# Patient Record
Sex: Female | Born: 1960 | Race: White | Hispanic: No | Smoking: Never smoker
Health system: Southern US, Community
[De-identification: ages and names within clinical notes are randomized; demographics above are authoritative.]

## PROBLEM LIST (undated history)

## (undated) DIAGNOSIS — A419 Sepsis, unspecified organism: Secondary | ICD-10-CM

## (undated) DIAGNOSIS — G2581 Restless legs syndrome: Secondary | ICD-10-CM

## (undated) DIAGNOSIS — J189 Pneumonia, unspecified organism: Secondary | ICD-10-CM

## (undated) DIAGNOSIS — K449 Diaphragmatic hernia without obstruction or gangrene: Secondary | ICD-10-CM

## (undated) DIAGNOSIS — R000 Tachycardia, unspecified: Secondary | ICD-10-CM

## (undated) DIAGNOSIS — K219 Gastro-esophageal reflux disease without esophagitis: Secondary | ICD-10-CM

## (undated) DIAGNOSIS — G35 Multiple sclerosis: Secondary | ICD-10-CM

## (undated) DIAGNOSIS — K5909 Other constipation: Secondary | ICD-10-CM

## (undated) DIAGNOSIS — I2699 Other pulmonary embolism without acute cor pulmonale: Secondary | ICD-10-CM

## (undated) DIAGNOSIS — R0489 Hemorrhage from other sites in respiratory passages: Secondary | ICD-10-CM

## (undated) DIAGNOSIS — R296 Repeated falls: Secondary | ICD-10-CM

## (undated) DIAGNOSIS — Z800 Family history of malignant neoplasm of digestive organs: Secondary | ICD-10-CM

## (undated) HISTORY — PX: TUBAL LIGATION: SHX77

---

## 2020-02-22 DIAGNOSIS — G35 Multiple sclerosis: Secondary | ICD-10-CM | POA: Insufficient documentation

## 2020-02-23 ENCOUNTER — Other Ambulatory Visit: Payer: Self-pay | Admitting: Acute Care

## 2020-02-23 DIAGNOSIS — G35 Multiple sclerosis: Secondary | ICD-10-CM

## 2020-03-08 ENCOUNTER — Ambulatory Visit: Payer: Self-pay

## 2020-03-10 ENCOUNTER — Ambulatory Visit
Admission: RE | Admit: 2020-03-10 | Discharge: 2020-03-10 | Disposition: A | Discharge status: Home / Self Care | Payer: Medicare Other | Source: Ambulatory Visit | Attending: Acute Care | Admitting: Acute Care

## 2020-03-10 ENCOUNTER — Other Ambulatory Visit: Payer: Self-pay

## 2020-03-10 ENCOUNTER — Ambulatory Visit: Payer: Medicare Other

## 2020-03-10 DIAGNOSIS — G35 Multiple sclerosis: Secondary | ICD-10-CM | POA: Diagnosis present

## 2020-03-10 MED ORDER — GADOBUTROL 1 MMOL/ML IV SOLN
5.0000 mL | Freq: Once | INTRAVENOUS | Status: AC | PRN
  Administered 2020-03-10: 5 mL via INTRAVENOUS

## 2020-04-18 DIAGNOSIS — R296 Repeated falls: Secondary | ICD-10-CM | POA: Insufficient documentation

## 2020-04-18 DIAGNOSIS — G2581 Restless legs syndrome: Secondary | ICD-10-CM | POA: Insufficient documentation

## 2020-04-18 DIAGNOSIS — M79604 Pain in right leg: Secondary | ICD-10-CM | POA: Insufficient documentation

## 2020-04-23 ENCOUNTER — Other Ambulatory Visit: Payer: Self-pay | Admitting: Neurology

## 2020-04-23 ENCOUNTER — Other Ambulatory Visit (HOSPITAL_COMMUNITY): Payer: Self-pay | Admitting: Neurology

## 2020-04-23 DIAGNOSIS — R29898 Other symptoms and signs involving the musculoskeletal system: Secondary | ICD-10-CM

## 2020-04-23 DIAGNOSIS — M79604 Pain in right leg: Secondary | ICD-10-CM

## 2020-05-02 ENCOUNTER — Ambulatory Visit: Payer: Medicare Other

## 2020-06-21 ENCOUNTER — Other Ambulatory Visit: Payer: Self-pay

## 2020-06-21 ENCOUNTER — Ambulatory Visit
Admission: RE | Admit: 2020-06-21 | Discharge: 2020-06-21 | Disposition: A | Discharge status: Home / Self Care | Payer: Medicare Other | Source: Ambulatory Visit | Attending: Neurology | Admitting: Neurology

## 2020-06-21 DIAGNOSIS — R29898 Other symptoms and signs involving the musculoskeletal system: Secondary | ICD-10-CM | POA: Diagnosis present

## 2020-06-21 DIAGNOSIS — M79604 Pain in right leg: Secondary | ICD-10-CM | POA: Insufficient documentation

## 2020-09-26 DIAGNOSIS — R200 Anesthesia of skin: Secondary | ICD-10-CM | POA: Insufficient documentation

## 2020-12-06 DIAGNOSIS — M545 Low back pain, unspecified: Secondary | ICD-10-CM | POA: Insufficient documentation

## 2021-06-06 ENCOUNTER — Other Ambulatory Visit: Payer: Self-pay | Admitting: Neurology

## 2021-06-06 DIAGNOSIS — G35 Multiple sclerosis: Secondary | ICD-10-CM

## 2021-06-10 ENCOUNTER — Other Ambulatory Visit: Payer: Self-pay | Admitting: Nurse Practitioner

## 2021-06-10 DIAGNOSIS — R1012 Left upper quadrant pain: Secondary | ICD-10-CM | POA: Insufficient documentation

## 2021-06-10 DIAGNOSIS — Z800 Family history of malignant neoplasm of digestive organs: Secondary | ICD-10-CM | POA: Insufficient documentation

## 2021-06-10 DIAGNOSIS — K5909 Other constipation: Secondary | ICD-10-CM | POA: Insufficient documentation

## 2021-06-10 DIAGNOSIS — K219 Gastro-esophageal reflux disease without esophagitis: Secondary | ICD-10-CM | POA: Insufficient documentation

## 2021-06-26 ENCOUNTER — Ambulatory Visit
Admission: RE | Admit: 2021-06-26 | Discharge: 2021-06-26 | Disposition: A | Discharge status: Home / Self Care | Payer: Medicare Other | Source: Ambulatory Visit | Attending: Nurse Practitioner | Admitting: Nurse Practitioner

## 2021-06-26 DIAGNOSIS — G35 Multiple sclerosis: Secondary | ICD-10-CM | POA: Diagnosis present

## 2021-06-26 DIAGNOSIS — R1012 Left upper quadrant pain: Secondary | ICD-10-CM | POA: Insufficient documentation

## 2021-06-26 HISTORY — DX: Multiple sclerosis: G35

## 2021-06-26 MED ORDER — IOHEXOL 300 MG/ML  SOLN
100.0000 mL | Freq: Once | INTRAMUSCULAR | Status: AC | PRN
  Administered 2021-06-26: 100 mL via INTRAVENOUS

## 2021-07-29 ENCOUNTER — Inpatient Hospital Stay: Payer: Medicare Other

## 2021-07-29 ENCOUNTER — Inpatient Hospital Stay: Payer: Medicare Other | Admitting: Licensed Clinical Social Worker

## 2021-08-23 ENCOUNTER — Emergency Department: Payer: Medicare Other

## 2021-08-23 ENCOUNTER — Other Ambulatory Visit: Payer: Self-pay

## 2021-08-23 ENCOUNTER — Emergency Department
Admission: EM | Admit: 2021-08-23 | Discharge: 2021-08-23 | Disposition: A | Discharge status: Home / Self Care | Payer: Medicare Other | Attending: Emergency Medicine | Admitting: Emergency Medicine

## 2021-08-23 DIAGNOSIS — S93401A Sprain of unspecified ligament of right ankle, initial encounter: Secondary | ICD-10-CM | POA: Diagnosis not present

## 2021-08-23 DIAGNOSIS — M25571 Pain in right ankle and joints of right foot: Secondary | ICD-10-CM | POA: Diagnosis not present

## 2021-08-23 DIAGNOSIS — W19XXXA Unspecified fall, initial encounter: Secondary | ICD-10-CM | POA: Insufficient documentation

## 2021-08-23 DIAGNOSIS — S99911A Unspecified injury of right ankle, initial encounter: Secondary | ICD-10-CM | POA: Diagnosis present

## 2021-08-23 MED ORDER — MELOXICAM 15 MG PO TABS: 15.0000 mg | ORAL_TABLET | Freq: Every day | ORAL | 0 refills | Status: DC

## 2021-08-23 MED ORDER — MELOXICAM 7.5 MG PO TABS
15.0000 mg | ORAL_TABLET | Freq: Once | ORAL | Status: AC
  Administered 2021-08-23: 15 mg via ORAL
  Filled 2021-08-23: qty 2

## 2021-08-23 MED ORDER — OXYCODONE-ACETAMINOPHEN 5-325 MG PO TABS
1.0000 | ORAL_TABLET | Freq: Once | ORAL | Status: AC
  Administered 2021-08-23: 1 via ORAL
  Filled 2021-08-23: qty 1

## 2021-08-23 NOTE — ED Provider Notes (Signed)
? ?[facility] ?Provider Note ? ?Patient Contact: 8:46 PM (approximate) ? ? ?History  ? ?Fall ? ? ?HPI ? ?[name] is a 61 y.o. female who presents the emergency department complaining of right ankle pain after rolling her ankle.  Patient states that she has MS and sometimes her feet and ankles will give out from underneath her.  She was attempting to stand when her ankle rolled.  She is having pain along the lateral aspect of her ankle joint.  Patient with no deformity.  She typically uses a wheelchair for most of her ambulation though she does stand and walk for short periods of time typically assisted with a walker.  No other injuries or complaints.  No medication prior to arrival. ?  ? ? ?Physical Exam  ? ?Triage Vital Signs: ?ED Triage Vitals  ?Enc Vitals Group  ?   BP 08/23/21 1903 134/85  ?   Pulse Rate 08/23/21 1903 (!) 114  ?   Resp 08/23/21 1903 18  ?   Temp 08/23/21 1903 98.2 ?F (36.8 ?C)  ?   Temp Source 08/23/21 1903 Oral  ?   SpO2 08/23/21 1903 97 %  ?   Weight 08/23/21 1904 126 lb (57.2 kg)  ?   Height 08/23/21 1904 5\' 2"  (1.575 m)  ?   Head Circumference --   ?   Peak Flow --   ?   Pain Score 08/23/21 1904 1  ?   Pain Loc --   ?   Pain Edu? --   ?   Excl. in [place]? --   ? ? ?Most recent vital signs: ?Vitals:  ? 08/23/21 1903  ?BP: 134/85  ?Pulse: (!) 114  ?Resp: 18  ?Temp: 98.2 ?F (36.8 ?C)  ?SpO2: 97%  ? ? ? ?General: Alert and in no acute distress.  ?Cardiovascular:  Good peripheral perfusion ?Respiratory: Normal respiratory effort without tachypnea or retractions. Lungs CTAB.  ?Musculoskeletal: Full range of motion to all extremities.  Visualization of the ankle joint reveals mild edema around the lateral ankle extending into the foot but no obvious deformity.  Patient has good range of motion preserved.  Tender along the anterolateral ankle joint extending into the foot.  No palpable abnormalities or deficits.  Pulses sensation intact to the foot. ?Neurologic:  No  gross focal neurologic deficits are appreciated.  ?Skin:   No rash noted ?Other: ? ? ?ED Results / Procedures / Treatments  ? ?Labs ?(all labs ordered are listed, but only abnormal results are displayed) ?Labs Reviewed - No data to display ? ? ?EKG ? ? ? ? ?RADIOLOGY ? ?I personally viewed, evaluated, and interpreted these images as part of my medical decision making, as well as reviewing the written report by the radiologist. ? ?ED Provider Interpretation: No acute traumatic findings to the ankle on x-ray. ? ?DG Ankle Complete Right ? ?Result Date: 08/23/2021 ?CLINICAL DATA:  fall EXAM: RIGHT ANKLE - COMPLETE 3+ VIEW COMPARISON:  None Available. FINDINGS: No acute fracture or dislocation. Joint spaces and alignment are maintained. No area of erosion or osseous destruction. No unexpected radiopaque foreign body. Mild soft tissue edema. IMPRESSION: No acute fracture or dislocation. Electronically Signed   By: Valentino Saxon M.D.   On: 08/23/2021 19:28   ? ?PROCEDURES: ? ?Critical Care performed: No ? ?Procedures ? ? ?MEDICATIONS ORDERED IN ED: ?Medications  ?meloxicam (MOBIC) tablet 15 mg (has no administration in time range)  ?oxyCODONE-acetaminophen (PERCOCET/ROXICET) 5-325 MG per tablet 1 tablet (  has no administration in time range)  ? ? ? ?IMPRESSION / MDM / ASSESSMENT AND PLAN / ED COURSE  ?I reviewed the triage vital signs and the nursing notes. ?             ?               ? ?Differential diagnosis includes, but is not limited to, ankle sprain, fracture, ankle dislocation, fracture dislocation ? ? ?Patient's diagnosis is consistent with ankle sprain.  Patient presented to the emergency department after rolling her ankle.  No obvious osseous trauma on x-ray.  Exam was otherwise reassuring with no indication of ligamentous injury or open wounds.  Patient will be placed in an ASO lace up stirrup ankle brace.  Anti-inflammatory for symptom relief.  Follow-up with orthopedics if symptoms or not improving with  conservative therapy.  Return precautions discussed with the patient. Patient is given ED precautions to return to the ED for any worsening or new symptoms. ? ? ? ?  ? ? ?FINAL CLINICAL IMPRESSION(S) / ED DIAGNOSES  ? ?Final diagnoses:  ?Sprain of right ankle, unspecified ligament, initial encounter  ? ? ? ?Rx / DC Orders  ? ?ED Discharge Orders   ? ?      Ordered  ?  meloxicam (MOBIC) 15 MG tablet  Daily       ? 08/23/21 2131  ? ?  ?  ? ?  ? ? ? ?Note:  This document was prepared using Dragon voice recognition software and may include unintentional dictation errors. ?  ?Darletta Moll, PA-C ?08/23/21 2131 ? ?  ?Duffy Bruce, MD ?08/24/21 2039 ? ?

## 2021-08-23 NOTE — ED Notes (Signed)
Pt and husband give verbal consent to DC.  

## 2021-08-23 NOTE — ED Triage Notes (Signed)
Pt has MS, pt was trying to stand and right ankle twisted and caused her to fall. Pt states she did not hurt anything but her right ankle. Pt denies LOC or hitting head.  ?

## 2021-08-23 NOTE — ED Notes (Signed)
Pt refused d/c vitals.

## 2021-09-01 ENCOUNTER — Encounter: Payer: Self-pay | Admitting: Emergency Medicine

## 2021-09-01 ENCOUNTER — Ambulatory Visit
Admission: EM | Admit: 2021-09-01 | Discharge: 2021-09-01 | Disposition: A | Discharge status: Home / Self Care | Payer: Medicare Other | Attending: Internal Medicine | Admitting: Internal Medicine

## 2021-09-01 DIAGNOSIS — N3000 Acute cystitis without hematuria: Secondary | ICD-10-CM | POA: Diagnosis present

## 2021-09-01 LAB — POCT URINALYSIS DIP (MANUAL ENTRY)
Bilirubin, UA: NEGATIVE
Blood, UA: NEGATIVE
Glucose, UA: NEGATIVE mg/dL
Ketones, POC UA: NEGATIVE mg/dL
Nitrite, UA: NEGATIVE
Protein Ur, POC: NEGATIVE mg/dL
Spec Grav, UA: 1.015 (ref 1.010–1.025)
Urobilinogen, UA: 0.2 E.U./dL
pH, UA: 7.5 (ref 5.0–8.0)

## 2021-09-01 MED ORDER — NITROFURANTOIN MONOHYD MACRO 100 MG PO CAPS: 100.0000 mg | ORAL_CAPSULE | Freq: Two times a day (BID) | ORAL | 0 refills | Status: DC

## 2021-09-01 NOTE — ED Triage Notes (Signed)
Pt presents with urinary frequency and some dysuria x 3 weeks

## 2021-09-01 NOTE — ED Provider Notes (Signed)
[facility]    CSN: [id number] Arrival date & time: 09/01/21  1058      History   Chief Complaint Chief Complaint  Patient presents with   Urinary Frequency   Dysuria    HPI [name] is a 61 y.o. female who presents with urinary frequency and some dysuria x 3 weeks. Last Uti 1 year ago. Her home test was positive this am. But does not know if for nitrates or leuks or both.     Past Medical History:  Diagnosis Date   Multiple sclerosis [facility])     Patient Active Problem List   Diagnosis Date Noted   Multiple sclerosis ([place]) 06/26/2021    Past Surgical History:  Procedure Laterality Date   TUBAL LIGATION      OB History   No obstetric history on file.      Home Medications    Prior to Admission medications   Medication Sig Start Date End Date Taking? Authorizing Provider  nitrofurantoin, macrocrystal-monohydrate, (MACROBID) 100 MG capsule Take 1 capsule (100 mg total) by mouth 2 (two) times daily. 09/01/21  Yes Rodriguez-Southworth, Sunday Spillers, PA-C  ascorbic acid (VITAMIN C) 500 MG tablet Take by mouth.    [provider]  Cholecalciferol 50 MCG (2000 UT) CAPS Take by mouth.    [provider]  cyanocobalamin 1000 MCG tablet Take by mouth.    [provider]  dalfampridine 10 MG TB12 Take by mouth. 08/14/21   [provider]  DULoxetine (CYMBALTA) 30 MG capsule Take 30 mg by mouth daily. 06/25/21   [provider]  meloxicam (MOBIC) 15 MG tablet Take 1 tablet (15 mg total) by mouth daily. 08/23/21 08/23/22  Cuthriell, Charline Bills, PA-C  omeprazole (PRILOSEC) 20 MG capsule Take 20 mg by mouth daily. 06/10/21   [provider]  pregabalin (LYRICA) 75 MG capsule Take 75 mg by mouth 3 (three) times daily. 08/18/21   [provider]  rOPINIRole (REQUIP) 0.25 MG tablet Take 0.25 mg at night for 1 week, then increase to 0.5 mg at night 05/29/21   [provider]    Family History History  reviewed. No pertinent family history.  Social History Social History   Tobacco Use   Smoking status: Never   Smokeless tobacco: Never  Vaping Use   Vaping Use: Never used  Substance Use Topics   Alcohol use: Yes    Comment: Social     Allergies   Amoxicillin, Erythromycin, and Lexapro [escitalopram oxalate]   Review of Systems Review of Systems  Constitutional:  Negative for fever.  Genitourinary:  Positive for dysuria and frequency. Negative for flank pain.    Physical Exam Triage Vital Signs ED Triage Vitals [09/01/21 1203]  Enc Vitals Group     BP 124/86     Pulse Rate (!) 112     Resp 18     Temp      Temp src      SpO2 100 %     Weight      Height      Head Circumference      Peak Flow      Pain Score 4     Pain Loc      Pain Edu?      Excl. in [place]?    No data found.  Updated Vital Signs BP 124/86 (BP Location: Right Arm)   Pulse (!) 112   Resp 18   SpO2 100%   Visual Acuity Right  Eye Distance:   Left Eye Distance:   Bilateral Distance:    Right Eye Near:   Left Eye Near:    Bilateral Near:      Physical Exam Vitals and nursing note reviewed.  Constitutional:      General: She is not in acute distress.    Appearance: She is not toxic-appearing.  HENT:     Head: Normocephalic.     Right Ear: External ear normal.     Left Ear: External ear normal.  Eyes:     General: No scleral icterus.    Conjunctiva/sclera: Conjunctivae normal.  Pulmonary:     Effort: Pulmonary effort is normal.  Abdominal:     General: Bowel sounds are normal.     Palpations: Abdomen is soft. There is no mass.     Tenderness: There is no guarding or rebound.     Comments: - CVA tenderness   Musculoskeletal:        General: Normal range of motion.     Cervical back: Neck supple.   Skin:    General: Skin is warm and dry.     Findings: No rash.  Neurological:     Mental Status: She is alert and oriented to person, place, and time.     Gait: Gait normal.   Psychiatric:        Mood and Affect: Mood normal.        Behavior: Behavior normal.        Thought Content: Thought content normal.        Judgment: Judgment normal.    UC Treatments / Results  Labs (all labs ordered are listed, but only abnormal results are displayed) Labs Reviewed  POCT URINALYSIS DIP (MANUAL ENTRY) - Abnormal; Notable for the following components:      Result Value   Leukocytes, UA Trace (*)    All other components within normal limits  URINE CULTURE    EKG   Radiology No results found.  Procedures Procedures (including critical care time)  Medications Ordered in UC Medications - No data to display  Initial Impression / Assessment and Plan / UC Course  I have reviewed the triage vital signs and the nursing notes.  Pertinent labs  results that were available during my care of the patient were reviewed by me and considered in my medical decision making (see chart for details).  Urine culture was sent out. I placed her on Macrobid in the mean time. We will call her if we need to change the medication.    Final Clinical Impressions(s) / UC Diagnoses   Final diagnoses:  Acute cystitis without hematuria   Discharge Instructions   None    ED Prescriptions     Medication Sig Dispense Auth. Provider   nitrofurantoin, macrocrystal-monohydrate, (MACROBID) 100 MG capsule Take 1 capsule (100 mg total) by mouth 2 (two) times daily. 10 capsule Rodriguez-Southworth, Sunday Spillers, PA-C      PDMP not reviewed this encounter.   Shelby Mattocks, Hershal Coria 09/01/21 1219

## 2021-09-03 ENCOUNTER — Telehealth (HOSPITAL_COMMUNITY): Payer: Self-pay | Admitting: Emergency Medicine

## 2021-09-03 LAB — URINE CULTURE
Culture: 70000 — AB
Special Requests: NORMAL

## 2021-09-03 MED ORDER — SULFAMETHOXAZOLE-TRIMETHOPRIM 800-160 MG PO TABS: 1.0000 | ORAL_TABLET | Freq: Two times a day (BID) | ORAL | 0 refills | Status: AC

## 2021-09-10 ENCOUNTER — Encounter: Payer: Self-pay | Admitting: Gastroenterology

## 2021-09-11 ENCOUNTER — Ambulatory Visit
Admission: RE | Admit: 2021-09-11 | Discharge: 2021-09-11 | Disposition: A | Discharge status: Home / Self Care | Payer: Medicare Other | Source: Ambulatory Visit | Attending: Gastroenterology | Admitting: Gastroenterology

## 2021-09-11 ENCOUNTER — Encounter
Admission: RE | Disposition: A | Discharge status: Home / Self Care | Payer: Self-pay | Source: Ambulatory Visit | Attending: Gastroenterology

## 2021-09-11 ENCOUNTER — Ambulatory Visit: Payer: Medicare Other | Admitting: Anesthesiology

## 2021-09-11 ENCOUNTER — Encounter: Payer: Self-pay | Admitting: Gastroenterology

## 2021-09-11 DIAGNOSIS — Z800 Family history of malignant neoplasm of digestive organs: Secondary | ICD-10-CM | POA: Insufficient documentation

## 2021-09-11 DIAGNOSIS — R1084 Generalized abdominal pain: Secondary | ICD-10-CM | POA: Diagnosis present

## 2021-09-11 DIAGNOSIS — K219 Gastro-esophageal reflux disease without esophagitis: Secondary | ICD-10-CM | POA: Diagnosis not present

## 2021-09-11 DIAGNOSIS — K319 Disease of stomach and duodenum, unspecified: Secondary | ICD-10-CM | POA: Insufficient documentation

## 2021-09-11 DIAGNOSIS — K2970 Gastritis, unspecified, without bleeding: Secondary | ICD-10-CM | POA: Diagnosis not present

## 2021-09-11 DIAGNOSIS — K449 Diaphragmatic hernia without obstruction or gangrene: Secondary | ICD-10-CM | POA: Insufficient documentation

## 2021-09-11 DIAGNOSIS — G35 Multiple sclerosis: Secondary | ICD-10-CM | POA: Diagnosis not present

## 2021-09-11 HISTORY — PX: ESOPHAGOGASTRODUODENOSCOPY (EGD) WITH PROPOFOL: SHX5813

## 2021-09-11 HISTORY — DX: Gastro-esophageal reflux disease without esophagitis: K21.9

## 2021-09-11 SURGERY — ESOPHAGOGASTRODUODENOSCOPY (EGD) WITH PROPOFOL
Anesthesia: General

## 2021-09-11 MED ORDER — PROPOFOL 500 MG/50ML IV EMUL
INTRAVENOUS | Status: DC | PRN
  Administered 2021-09-11: 200 ug/kg/min via INTRAVENOUS

## 2021-09-11 MED ORDER — SODIUM CHLORIDE 0.9 % IV SOLN
INTRAVENOUS | Status: DC
  Administered 2021-09-11: 1000 mL via INTRAVENOUS

## 2021-09-11 MED ORDER — ESMOLOL HCL 100 MG/10ML IV SOLN
INTRAVENOUS | Status: DC | PRN
  Administered 2021-09-11: 10 mg via INTRAVENOUS

## 2021-09-11 MED ORDER — PROPOFOL 10 MG/ML IV BOLUS
INTRAVENOUS | Status: DC | PRN
  Administered 2021-09-11: 60 mg via INTRAVENOUS

## 2021-09-11 NOTE — H&P (Signed)
Pre-Procedure H&P   Patient ID: [name] is a 61 y.o. female.  Gastroenterology Provider: Annamaria Helling, DO  Referring Provider: Dawson Bills, NP PCP: Merryl Hacker, No  Date: 09/11/2021  HPI [name] is a 61 y.o. female who presents today for Esophagogastroduodenoscopy for chronic abdominal pain; worsening heart burn; fhx gastric cancer. Patient with chronic abdominal pain.  She notes that this is exacerbated by both ambulating and sitting.  Patient notes reflux which is improved with PPI use.  Patient has also been taking Excedrin.  She notes that the pain improves with bowel movements that time as well. CT was performed with a hiatal hernia but otherwise unremarkable. She had normal EGD multiple years ago.  Colonoscopy in 2018 was also normal with internal hemorrhoids but 10-year recommended follow-up.  Personal history endometriosis Father with history of pancreatic cancer; maternal aunt with history of pancreatic cancer and gastric cancer. Celiac negative creatinine 0.7 hemoglobin 13.5 MCV 89 platelets 185,000  Past Medical History:  Diagnosis Date   GERD (gastroesophageal reflux disease)    Multiple sclerosis ([facility])     Past Surgical History:  Procedure Laterality Date   TUBAL LIGATION      Family History Mother pancreatic ca; Maternal Aunt- pancreatic ca and gastric ca  Review of Systems  Constitutional:  Negative for activity change, appetite change, chills, diaphoresis, fatigue, fever and unexpected weight change.  HENT:  Negative for trouble swallowing and voice change.   Respiratory:  Negative for shortness of breath and wheezing.   Cardiovascular:  Negative for chest pain, palpitations and leg swelling.  Gastrointestinal:  Positive for abdominal pain and constipation. Negative for abdominal distention, anal bleeding, blood in stool, diarrhea, nausea, rectal pain and vomiting.  Musculoskeletal:  Negative for arthralgias and myalgias.  Skin:   Negative for color change and pallor.  Neurological:  Negative for dizziness, syncope and weakness.  Psychiatric/Behavioral:  Negative for confusion.   All other systems reviewed and are negative.   Medications No current facility-administered medications on file prior to encounter.   No current outpatient medications on file prior to encounter.    Pertinent medications related to GI and procedure were reviewed by me with the patient prior to the procedure   Current Facility-Administered Medications:    0.9 %  sodium chloride infusion, , Intravenous, Continuous, Annamaria Helling, DO, Last Rate: 20 mL/hr at 09/11/21 1300, 1,000 mL at 09/11/21 1300      Allergies  Allergen Reactions   Amoxicillin Hives and Nausea And Vomiting   Erythromycin Hives and Nausea And Vomiting   Lexapro [Escitalopram Oxalate] Hives   Allergies were reviewed by me prior to the procedure  Objective   Body mass index is 23.05 kg/m. Vitals:   09/11/21 1233  BP: 138/81  Pulse: 94  Resp: 20  Temp: 98.5 F (36.9 C)  SpO2: 99%  Weight: 57.2 kg  Height: 5\' 2"  (1.575 m)     Physical Exam Vitals and nursing note reviewed.  Constitutional:      General: She is not in acute distress.    Appearance: She is not ill-appearing, toxic-appearing or diaphoretic.  HENT:     Head: Normocephalic and atraumatic.     Nose: Nose normal.     Mouth/Throat:     Mouth: Mucous membranes are moist.     Pharynx: Oropharynx is clear.  Eyes:     General: No scleral icterus.    Extraocular Movements: Extraocular movements intact.  Cardiovascular:     Rate  and Rhythm: Normal rate and regular rhythm.     Heart sounds: Normal heart sounds. No murmur heard.   No friction rub. No gallop.  Pulmonary:     Effort: Pulmonary effort is normal. No respiratory distress.     Breath sounds: Normal breath sounds. No wheezing, rhonchi or rales.  Abdominal:     General: Bowel sounds are normal. There is no distension.      Palpations: Abdomen is soft.     Tenderness: There is no abdominal tenderness. There is no guarding or rebound.  Musculoskeletal:     Cervical back: Neck supple.     Right lower leg: No edema.     Left lower leg: No edema.     Comments: Legs spastic  Skin:    General: Skin is warm and dry.     Coloration: Skin is not jaundiced or pale.  Neurological:     General: No focal deficit present.     Mental Status: She is alert and oriented to person, place, and time. Mental status is at baseline.  Psychiatric:        Mood and Affect: Mood normal.        Behavior: Behavior normal.        Thought Content: Thought content normal.        Judgment: Judgment normal.     Assessment:  [name] is a 61 y.o. female  who presents today for Esophagogastroduodenoscopy for chronic abdominal pain; worsening heart burn; fhx gastric cancer.  Plan:  Esophagogastroduodenoscopy with possible intervention today  Esophagogastroduodenoscopy with possible biopsy, control of bleeding, polypectomy, and interventions as necessary has been discussed with the patient/patient representative. Informed consent was obtained from the patient/patient representative after explaining the indication, nature, and risks of the procedure including but not limited to death, bleeding, perforation, missed neoplasm/lesions, cardiorespiratory compromise, and reaction to medications. Opportunity for questions was given and appropriate answers were provided. Patient/patient representative has verbalized understanding is amenable to undergoing the procedure.   Annamaria Helling, DO  [facility]  Portions of the record may have been created with voice recognition software. Occasional wrong-word or 'sound-a-like' substitutions may have occurred due to the inherent limitations of voice recognition software.  Read the chart carefully and recognize, using context, where substitutions may have  occurred.

## 2021-09-11 NOTE — Anesthesia Preprocedure Evaluation (Signed)
Anesthesia Evaluation  Patient identified by MRN, date of birth, ID band Patient awake    Reviewed: Allergy & Precautions, NPO status , Patient's Chart, lab work & pertinent test results  History of Anesthesia Complications Negative for: history of anesthetic complications  Airway Mallampati: III  TM Distance: <3 FB Neck ROM: full    Dental  (+) Chipped   Pulmonary neg pulmonary ROS, neg shortness of breath,    Pulmonary exam normal        Cardiovascular Exercise Tolerance: Poor (-) anginaNormal cardiovascular exam     Neuro/Psych  Neuromuscular disease negative psych ROS   GI/Hepatic Neg liver ROS, GERD  Controlled,  Endo/Other  negative endocrine ROS  Renal/GU negative Renal ROS  negative genitourinary   Musculoskeletal   Abdominal   Peds  Hematology negative hematology ROS (+)   Anesthesia Other Findings Past Medical History: No date: GERD (gastroesophageal reflux disease) No date: Multiple sclerosis ([facility]CC)  Past Surgical History: No date: TUBAL LIGATION  BMI    Body Mass Index: 23.05 kg/m      Reproductive/Obstetrics negative OB ROS                             Anesthesia Physical Anesthesia Plan  ASA: 3  Anesthesia Plan: General   Post-op Pain Management:    Induction: Intravenous  PONV Risk Score and Plan: Propofol infusion and TIVA  Airway Management Planned: Natural Airway and Nasal Cannula  Additional Equipment:   Intra-op Plan:   Post-operative Plan:   Informed Consent: I have reviewed the patients History and Physical, chart, labs and discussed the procedure including the risks, benefits and alternatives for the proposed anesthesia with the patient or authorized representative who has indicated his/her understanding and acceptance.     Dental Advisory Given  Plan Discussed with: Anesthesiologist, CRNA and Surgeon  Anesthesia Plan Comments: (Patient  consented for risks of anesthesia including but not limited to:  - adverse reactions to medications - risk of airway placement if required - damage to eyes, teeth, lips or other oral mucosa - nerve damage due to positioning  - sore throat or hoarseness - Damage to heart, brain, nerves, lungs, other parts of body or loss of life  Patient voiced understanding.)        Anesthesia Quick Evaluation

## 2021-09-11 NOTE — Anesthesia Postprocedure Evaluation (Signed)
Anesthesia Post Note  Patient: Susette Racer  Procedure(s) Performed: ESOPHAGOGASTRODUODENOSCOPY (EGD) WITH PROPOFOL  Patient location during evaluation: PACU Anesthesia Type: General Level of consciousness: awake and alert, oriented and patient cooperative Pain management: pain level controlled Vital Signs Assessment: post-procedure vital signs reviewed and stable Respiratory status: spontaneous breathing, nonlabored ventilation and respiratory function stable Cardiovascular status: blood pressure returned to baseline and stable Postop Assessment: adequate PO intake Anesthetic complications: no   No notable events documented.   Last Vitals:  Vitals:   09/11/21 1417 09/11/21 1427  BP: 136/81 133/74  Pulse: (!) 108 (!) 101  Resp: 18 16  Temp:    SpO2: 98% 98%    Last Pain:  Vitals:   09/11/21 1427  TempSrc:   PainSc: 0-No pain                 Darrin Nipper

## 2021-09-11 NOTE — Op Note (Addendum)
[facility] Gastroenterology Patient Name: [name] Procedure Date: 09/11/2021 1:39 PM MRN: [id number] Account #: [id number] Date of Birth: 01-09-1961 Admit Type: Outpatient Age: 61 Room: [facility] ENDO ROOM 1 Gender: Female Note Status: Supervisor Override Instrument Name: Altamese Cabal Endoscope [id number] Procedure:             Upper GI endoscopy Indications:           Generalized abdominal pain, Gastro-esophageal reflux                         disease Providers:             Annamaria Helling DO, DO Medicines:             Monitored Anesthesia Care Complications:         No immediate complications. Estimated blood loss:                         Minimal. Procedure:             Pre-Anesthesia Assessment:                        - Prior to the procedure, a History and Physical was                         performed, and patient medications and allergies were                         reviewed. The patient is competent. The risks and                         benefits of the procedure and the sedation options and                         risks were discussed with the patient. All questions                         were answered and informed consent was obtained.                         Patient identification and proposed procedure were                         verified by the physician, the nurse, the anesthetist                         and the technician in the endoscopy suite. Mental                         Status Examination: alert and oriented. Airway                         Examination: normal oropharyngeal airway and neck                         mobility. Respiratory Examination: clear to                         auscultation. CV Examination: RRR, no murmurs, no S3  or S4. Prophylactic Antibiotics: The patient does not                         require prophylactic antibiotics. Prior                         Anticoagulants: The patient has taken no  previous                         anticoagulant or antiplatelet agents. ASA Grade                         Assessment: III - A patient with severe systemic                         disease. After reviewing the risks and benefits, the                         patient was deemed in satisfactory condition to                         undergo the procedure. The anesthesia plan was to use                         monitored anesthesia care (MAC). Immediately prior to                         administration of medications, the patient was                         re-assessed for adequacy to receive sedatives. The                         heart rate, respiratory rate, oxygen saturations,                         blood pressure, adequacy of pulmonary ventilation, and                         response to care were monitored throughout the                         procedure. The physical status of the patient was                         re-assessed after the procedure.                        After obtaining informed consent, the endoscope was                         passed under direct vision. Throughout the procedure,                         the patient's blood pressure, pulse, and oxygen                         saturations were monitored continuously. The Endoscope  was introduced through the mouth, and advanced to the                         second part of duodenum. The upper GI endoscopy was                         accomplished without difficulty. The patient tolerated                         the procedure well. Findings:      The duodenal bulb, first portion of the duodenum and second portion of       the duodenum were normal. Estimated blood loss: none. Biopsies for       histology were taken with a cold forceps for evaluation of celiac       disease. Estimated blood loss was minimal.      Localized mild inflammation characterized by erythema was found in the       gastric antrum.  Biopsies were taken with a cold forceps for Helicobacter       pylori testing. Estimated blood loss was minimal.      A 3 cm hiatal hernia was present. Estimated blood loss: none.      The Z-line was regular. Estimated blood loss: none.      The exam of the esophagus was otherwise normal. Impression:            - Normal duodenal bulb, first portion of the duodenum                         and second portion of the duodenum. Biopsied.                        - Gastritis. Biopsied.                        - 3 cm hiatal hernia.                        - Z-line regular. Recommendation:        - Discharge patient to home.                        - Resume previous diet.                        - Continue present medications.                        - Await pathology results.                        - Return to GI clinic as previously scheduled.                        - The findings and recommendations were discussed with                         the patient. Procedure Code(s):     --- Professional ---                        [phone], Esophagogastroduodenoscopy, flexible,  transoral; with biopsy, single or multiple Diagnosis Code(s):     --- Professional ---                        K29.70, Gastritis, unspecified, without bleeding                        K44.9, Diaphragmatic hernia without obstruction or                         gangrene                        R10.84, Generalized abdominal pain CPT copyright 2019 American Medical Association. All rights reserved. The codes documented in this report are preliminary and upon coder review may  be revised to meet current compliance requirements. Attending Participation:      I personally performed the entire procedure. Volney American, DO Annamaria Helling DO, DO 09/11/2021 2:11:25 PM This report has been signed electronically. Number of Addenda: 0 Note Initiated On: 09/11/2021 1:39 PM Estimated Blood Loss:  Estimated blood loss was  minimal.      [facility]

## 2021-09-11 NOTE — Anesthesia Procedure Notes (Signed)
Date/Time: 09/11/2021 1:58 PM Performed by: Junious Silk, CRNA Pre-anesthesia Checklist: Patient identified, Emergency Drugs available, Suction available, Patient being monitored and Timeout performed Oxygen Delivery Method: Nasal cannula

## 2021-09-11 NOTE — Transfer of Care (Signed)
Immediate Anesthesia Transfer of Care Note  Patient: [name]  Procedure(s) Performed: ESOPHAGOGASTRODUODENOSCOPY (EGD) WITH PROPOFOL  Patient Location: PACU  Anesthesia Type:General  Level of Consciousness: sedated  Airway & Oxygen Therapy: Patient Spontanous Breathing and Patient connected to nasal cannula oxygen  Post-op Assessment: Report given to RN and Post -op Vital signs reviewed and stable  Post vital signs: Reviewed and stable  Last Vitals:  Vitals Value Taken Time  BP    Temp    Pulse    Resp    SpO2      Last Pain:  Vitals:   09/11/21 1233  PainSc: 0-No pain      Patients Stated Pain Goal: 0 (09/11/21 1233)  Complications: No notable events documented.

## 2021-09-11 NOTE — Interval H&P Note (Signed)
History and Physical Interval Note: Preprocedure H&P from 09/11/21  was reviewed and there was no interval change after seeing and examining the patient.  Written consent was obtained from the patient after discussion of risks, benefits, and alternatives. Patient has consented to proceed with Esophagogastroduodenoscopy with possible intervention   09/11/2021 1:51 PM  [name]  has presented today for surgery, with the diagnosis of GERD.  The various methods of treatment have been discussed with the patient and family. After consideration of risks, benefits and other options for treatment, the patient has consented to  Procedure(s): ESOPHAGOGASTRODUODENOSCOPY (EGD) WITH PROPOFOL (N/A) as a surgical intervention.  The patient's history has been reviewed, patient examined, no change in status, stable for surgery.  I have reviewed the patient's chart and labs.  Questions were answered to the patient's satisfaction.     Jaynie Collins

## 2021-09-12 ENCOUNTER — Encounter: Payer: Self-pay | Admitting: Gastroenterology

## 2021-09-15 LAB — SURGICAL PATHOLOGY

## 2021-09-22 ENCOUNTER — Other Ambulatory Visit: Payer: Medicare Other

## 2021-09-23 ENCOUNTER — Ambulatory Visit
Admission: RE | Admit: 2021-09-23 | Discharge: 2021-09-23 | Disposition: A | Discharge status: Home / Self Care | Payer: Medicare Other | Source: Ambulatory Visit | Attending: Neurology | Admitting: Neurology

## 2021-09-23 DIAGNOSIS — G35 Multiple sclerosis: Secondary | ICD-10-CM | POA: Diagnosis present

## 2021-09-23 MED ORDER — GADOBUTROL 1 MMOL/ML IV SOLN
6.0000 mL | Freq: Once | INTRAVENOUS | Status: AC | PRN
Start: 2021-09-23 — End: 2021-09-23
  Administered 2021-09-23: 6 mL via INTRAVENOUS

## 2021-12-02 ENCOUNTER — Ambulatory Visit
Admission: EM | Admit: 2021-12-02 | Discharge: 2021-12-02 | Disposition: A | Discharge status: Home / Self Care | Payer: Medicare Other | Attending: Emergency Medicine | Admitting: Emergency Medicine

## 2021-12-02 DIAGNOSIS — R300 Dysuria: Secondary | ICD-10-CM | POA: Insufficient documentation

## 2021-12-02 LAB — POCT URINALYSIS DIP (MANUAL ENTRY)
Bilirubin, UA: NEGATIVE
Blood, UA: NEGATIVE
Glucose, UA: NEGATIVE mg/dL
Ketones, POC UA: NEGATIVE mg/dL
Nitrite, UA: NEGATIVE
Protein Ur, POC: NEGATIVE mg/dL
Spec Grav, UA: 1.01 (ref 1.010–1.025)
Urobilinogen, UA: 0.2 E.U./dL
pH, UA: 7 (ref 5.0–8.0)

## 2021-12-02 MED ORDER — SULFAMETHOXAZOLE-TRIMETHOPRIM 800-160 MG PO TABS
1.0000 | ORAL_TABLET | Freq: Two times a day (BID) | ORAL | 0 refills | Status: AC
Start: 2021-12-02 — End: 2021-12-07

## 2021-12-02 NOTE — Discharge Instructions (Addendum)
Take the antibiotic as directed.  The urine culture is pending.  We will call you if it shows the need to change or discontinue your antibiotic.    Follow up with your primary care provider if your symptoms are not improving.    

## 2021-12-02 NOTE — ED Provider Notes (Signed)
[facility]    CSN: [id number] Arrival date & time: 12/02/21  1115      History   Chief Complaint Chief Complaint  Patient presents with   Urinary Frequency    HPI [name] is a 61 y.o. female.  Patient presents with 2-week history of discomfort with urination and malodorous urine.  She reports history of frequent UTIs with similar symptoms.  She denies hematuria, fever, chills, abdominal pain, flank pain, nausea, vomiting, diarrhea, constipation, vaginal discharge, pelvic pain, or other symptoms.  No treatments at home.  Her medical history includes multiple sclerosis.  The history is provided by the patient and medical records.    Past Medical History:  Diagnosis Date   GERD (gastroesophageal reflux disease)    Multiple sclerosis (HCC)     Patient Active Problem List   Diagnosis Date Noted   Multiple sclerosis (HCC) 06/26/2021    Past Surgical History:  Procedure Laterality Date   ESOPHAGOGASTRODUODENOSCOPY (EGD) WITH PROPOFOL N/A 09/11/2021   Procedure: ESOPHAGOGASTRODUODENOSCOPY (EGD) WITH PROPOFOL;  Surgeon: Jaynie Collins, DO;  Location: [facility] ENDOSCOPY;  Service: Gastroenterology;  Laterality: N/A;   TUBAL LIGATION      OB History   No obstetric history on file.      Home Medications    Prior to Admission medications   Medication Sig Start Date End Date Taking? Authorizing Provider  sulfamethoxazole-trimethoprim (BACTRIM DS) 800-160 MG tablet Take 1 tablet by mouth 2 (two) times daily for 5 days. 12/02/21 12/07/21 Yes Mickie Bail, NP  ascorbic acid (VITAMIN C) 500 MG tablet Take by mouth.    [provider]  Cholecalciferol 50 MCG (2000 UT) CAPS Take by mouth.    [provider]  cyanocobalamin 1000 MCG tablet Take by mouth.    [provider]  dalfampridine 10 MG TB12 Take by mouth. 08/14/21   [provider]  DULoxetine (CYMBALTA) 30 MG capsule Take 30 mg by mouth daily. 06/25/21   [provider]  meloxicam (MOBIC) 15 MG tablet Take 1 tablet (15 mg total) by mouth daily. 08/23/21 08/23/22  Cuthriell, Delorise Royals, PA-C  omeprazole (PRILOSEC) 20 MG capsule Take 20 mg by mouth daily. 06/10/21   [provider]  pregabalin (LYRICA) 75 MG capsule Take 75 mg by mouth 3 (three) times daily. 08/18/21   [provider]  rOPINIRole (REQUIP) 0.25 MG tablet Take 0.25 mg at night for 1 week, then increase to 0.5 mg at night 05/29/21   [provider]    Family History History reviewed. No pertinent family history.  Social History Social History   Tobacco Use   Smoking status: Never   Smokeless tobacco: Never  Vaping Use   Vaping Use: Never used  Substance Use Topics   Alcohol use: Yes    Comment: Social   Drug use: Never     Allergies   Erythromycin and Lexapro [escitalopram oxalate]   Review of Systems Review of Systems  Constitutional:  Negative for chills and fever.  Gastrointestinal:  Negative for abdominal pain, diarrhea, nausea and vomiting.  Genitourinary:  Positive for dysuria. Negative for flank pain, hematuria, pelvic pain and vaginal discharge.  All other systems reviewed and are negative.    Physical Exam Triage Vital Signs ED Triage Vitals  Enc Vitals Group     BP      Pulse      Resp      Temp      Temp src  SpO2      Weight      Height      Head Circumference      Peak Flow      Pain Score      Pain Loc      Pain Edu?      Excl. in GC?    No data found.  Updated Vital Signs BP 104/67   Pulse 83   Temp 98.2 F (36.8 C)   Resp 18   Ht 5\' 2"  (1.575 m)   Wt 126 lb (57.2 kg)   SpO2 98%   BMI 23.05 kg/m   Visual Acuity Right Eye Distance:   Left Eye Distance:   Bilateral Distance:    Right Eye Near:   Left Eye Near:    Bilateral Near:     Physical Exam Vitals and nursing note reviewed.  Constitutional:      General: She is not in acute distress.    Appearance: Normal appearance. She is  well-developed. She is not ill-appearing.  HENT:     Mouth/Throat:     Mouth: Mucous membranes are moist.  Cardiovascular:     Rate and Rhythm: Normal rate and regular rhythm.     Heart sounds: Normal heart sounds.  Pulmonary:     Effort: Pulmonary effort is normal. No respiratory distress.     Breath sounds: Normal breath sounds.  Abdominal:     Palpations: Abdomen is soft.     Tenderness: There is no abdominal tenderness. There is no right CVA tenderness, left CVA tenderness, guarding or rebound.  Musculoskeletal:     Cervical back: Neck supple.  Skin:    General: Skin is warm and dry.  Neurological:     Mental Status: She is alert.  Psychiatric:        Mood and Affect: Mood normal.        Behavior: Behavior normal.      UC Treatments / Results  Labs (all labs ordered are listed, but only abnormal results are displayed) Labs Reviewed  POCT URINALYSIS DIP (MANUAL ENTRY) - Abnormal; Notable for the following components:      Result Value   Leukocytes, UA Small (1+) (*)    All other components within normal limits  URINE CULTURE    EKG   Radiology No results found.  Procedures Procedures (including critical care time)  Medications Ordered in UC Medications - No data to display  Initial Impression / Assessment and Plan / UC Course  I have reviewed the triage vital signs and the nursing notes.  Pertinent labs & imaging results that were available during my care of the patient were reviewed by me and considered in my medical decision making (see chart for details).    Dysuria. Treating with Bactrim based on last urine culture. Urine culture pending. Discussed with patient that we will call her if the urine culture shows the need to change or discontinue the antibiotic. Instructed her to follow-up with her PCP if her symptoms are not improving. Patient agrees to plan of care.     Final Clinical Impressions(s) / UC Diagnoses   Final diagnoses:  Dysuria      Discharge Instructions      Take the antibiotic as directed.  The urine culture is pending.  We will call you if it shows the need to change or discontinue your antibiotic.    Follow up with your primary care provider if your symptoms are not improving.  ED Prescriptions     Medication Sig Dispense Auth. Provider   sulfamethoxazole-trimethoprim (BACTRIM DS) 800-160 MG tablet Take 1 tablet by mouth 2 (two) times daily for 5 days. 10 tablet Mickie Bail, NP      PDMP not reviewed this encounter.   Mickie Bail, NP 12/02/21 [phone]

## 2021-12-02 NOTE — ED Triage Notes (Signed)
Patient to Urgent Care with complaints of dysuria and foul smelling urine. States symptoms started 2 weeks ago. No known fevers.

## 2021-12-03 ENCOUNTER — Telehealth (HOSPITAL_COMMUNITY): Payer: Self-pay | Admitting: Emergency Medicine

## 2021-12-03 LAB — URINE CULTURE: Culture: >=100000 — AB

## 2021-12-03 MED ORDER — NITROFURANTOIN MONOHYD MACRO 100 MG PO CAPS: 100.0000 mg | ORAL_CAPSULE | Freq: Two times a day (BID) | ORAL | 0 refills | Status: DC

## 2021-12-29 ENCOUNTER — Other Ambulatory Visit: Payer: Self-pay | Admitting: Internal Medicine

## 2021-12-29 DIAGNOSIS — Z1231 Encounter for screening mammogram for malignant neoplasm of breast: Secondary | ICD-10-CM

## 2021-12-31 ENCOUNTER — Other Ambulatory Visit: Payer: Self-pay | Admitting: Internal Medicine

## 2021-12-31 DIAGNOSIS — G8929 Other chronic pain: Secondary | ICD-10-CM

## 2022-01-19 ENCOUNTER — Ambulatory Visit
Admission: EM | Admit: 2022-01-19 | Discharge: 2022-01-19 | Disposition: A | Discharge status: Home / Self Care | Payer: Medicare Other | Attending: Urgent Care | Admitting: Urgent Care

## 2022-01-19 DIAGNOSIS — R350 Frequency of micturition: Secondary | ICD-10-CM | POA: Insufficient documentation

## 2022-01-19 DIAGNOSIS — N3000 Acute cystitis without hematuria: Secondary | ICD-10-CM | POA: Insufficient documentation

## 2022-01-19 LAB — POCT URINALYSIS DIP (MANUAL ENTRY)
Bilirubin, UA: NEGATIVE
Glucose, UA: NEGATIVE mg/dL
Ketones, POC UA: NEGATIVE mg/dL
Nitrite, UA: NEGATIVE
Protein Ur, POC: NEGATIVE mg/dL
Spec Grav, UA: 1.01 (ref 1.010–1.025)
Urobilinogen, UA: 0.2 E.U./dL
pH, UA: 6.5 (ref 5.0–8.0)

## 2022-01-19 MED ORDER — NITROFURANTOIN MONOHYD MACRO 100 MG PO CAPS: 100.0000 mg | ORAL_CAPSULE | Freq: Two times a day (BID) | ORAL | 0 refills | Status: DC

## 2022-01-19 NOTE — ED Provider Notes (Signed)
[name]    CSN: [id number] Arrival date & time: 01/19/22  1711      History   Chief Complaint No chief complaint on file.   HPI [name] is a 61 y.o. female.   HPI  Patient presents to urgent care with complaint of urinary frequency and back pain times.  Endorses mild dysuria.  Patient seen in urgent care 12/02/2021 for dysuria and treated at that time with Bactrim.  Patient switched to [facility] after culture and sensitivity.  Past Medical History:  Diagnosis Date   GERD (gastroesophageal reflux disease)    Multiple sclerosis ([place])     Patient Active Problem List   Diagnosis Date Noted   Multiple sclerosis ([place]) 06/26/2021   Abdominal pain, LUQ (left upper quadrant) 06/10/2021   Chronic constipation 06/10/2021   Family history of pancreatic cancer 06/10/2021   Gastroesophageal reflux disease 06/10/2021   Low back pain 12/06/2020   Numbness 09/26/2020   Falls frequently 04/18/2020   Right leg pain 04/18/2020   RLS (restless legs syndrome) 04/18/2020   History of multiple sclerosis ([place]) 02/22/2020    Past Surgical History:  Procedure Laterality Date   ESOPHAGOGASTRODUODENOSCOPY (EGD) WITH PROPOFOL N/A 09/11/2021   Procedure: ESOPHAGOGASTRODUODENOSCOPY (EGD) WITH PROPOFOL;  Surgeon: Annamaria Helling, DO;  Location: [facility];  Service: Gastroenterology;  Laterality: N/A;   TUBAL LIGATION      OB History   No obstetric history on file.      Home Medications    Prior to Admission medications   Medication Sig Start Date End Date Taking? Authorizing Provider  dalfampridine 10 MG TB12 Take 1 tablet by mouth every 12 (twelve) hours. 12/29/21  Yes [provider]  gabapentin (NEURONTIN) 400 MG capsule Take Gabapentin 400 mg in the morning, 400 mg in the afternoon, and 800 mg at night 12/29/21  Yes [provider]  propranolol (INDERAL) 20 MG tablet Take 1 tablet by mouth 2 (two) times daily. 12/16/21  Yes [provider]  tolterodine (DETROL LA) 2 MG 24 hr capsule Take by mouth. 12/29/21 12/29/22 Yes [provider]  ascorbic acid (VITAMIN C) 500 MG tablet Take by mouth.    [provider]  Cholecalciferol 50 MCG (2000 UT) CAPS Take by mouth.    [provider]  cyanocobalamin 1000 MCG tablet Take by mouth.    [provider]  cyclobenzaprine (FLEXERIL) 10 MG tablet Take 10 mg by mouth 2 (two) times daily as needed. 11/26/21   [provider]  dalfampridine 10 MG TB12 Take by mouth. 08/14/21   [provider]  DULoxetine (CYMBALTA) 30 MG capsule Take 30 mg by mouth daily. 06/25/21   [provider]  meloxicam (MOBIC) 15 MG tablet Take 1 tablet (15 mg total) by mouth daily. 08/23/21 08/23/22  Cuthriell, Charline Bills, PA-C  methylPREDNISolone (MEDROL DOSEPAK) 4 MG TBPK tablet Take by mouth. 11/26/21   [provider]  naproxen (NAPROSYN) 500 MG tablet Take by mouth.    [provider]  nitrofurantoin, macrocrystal-monohydrate, (MACROBID) 100 MG capsule Take 1 capsule (100 mg total) by mouth 2 (two) times daily. 12/03/21   Chase Picket, MD  omeprazole (PRILOSEC) 20 MG capsule Take 20 mg by mouth daily. 06/10/21   [provider]  pregabalin (LYRICA) 75 MG capsule Take 75 mg by mouth 3 (three) times daily. 08/18/21   [provider]  rOPINIRole (REQUIP) 0.25 MG tablet Take 0.25 mg at night for 1 week, then increase to  0.5 mg at night 05/29/21   [provider]  traMADol (ULTRAM) 50 MG tablet Take 50 mg by mouth 3 (three) times daily as needed. 12/26/21   [provider]    Family History History reviewed. No pertinent family history.  Social History Social History   Tobacco Use   Smoking status: Never   Smokeless tobacco: Never  Vaping Use   Vaping Use: Never used  Substance Use Topics   Alcohol use: Yes    Comment: Social   Drug use: Never     Allergies   Erythromycin and Lexapro  [escitalopram oxalate]   Review of Systems Review of Systems   Physical Exam Triage Vital Signs ED Triage Vitals [01/19/22 1732]  Enc Vitals Group     BP 116/78     Pulse Rate 100     Resp 14     Temp 98.2 F (36.8 C)     Temp src      SpO2 97 %     Weight      Height      Head Circumference      Peak Flow      Pain Score      Pain Loc      Pain Edu?      Excl. in GC?    No data found.  Updated Vital Signs BP 116/78   Pulse 100   Temp 98.2 F (36.8 C)   Resp 14   SpO2 97%   Visual Acuity Right Eye Distance:   Left Eye Distance:   Bilateral Distance:    Right Eye Near:   Left Eye Near:    Bilateral Near:     Physical Exam   UC Treatments / Results  Labs (all labs ordered are listed, but only abnormal results are displayed) Labs Reviewed - No data to display  EKG   Radiology No results found.  Procedures Procedures (including critical care time)  Medications Ordered in UC Medications - No data to display  Initial Impression / Assessment and Plan / UC Course  I have reviewed the triage vital signs and the nursing notes.  Pertinent labs & imaging results that were available during my care of the patient were reviewed by me and considered in my medical decision making (see chart for details).   UA positive with small leukocytes.  Will treat today with Macrobid based on last culture. 06/10/2021 GFR = 86   Final Clinical Impressions(s) / UC Diagnoses   Final diagnoses:  None   Discharge Instructions   None    ED Prescriptions   None    PDMP not reviewed this encounter.   Charma Igo, [place] 01/19/22 [name]

## 2022-01-19 NOTE — ED Triage Notes (Signed)
Pt. States she has been experiencing increase urinary frequency  and back pain. Pt. Did a home test for a UTI and it came back positive.

## 2022-01-19 NOTE — Discharge Instructions (Addendum)
Follow up here or with your primary care provider if your symptoms are worsening or not improving with treatment.     

## 2022-01-22 LAB — URINE CULTURE: Culture: >=100000 — AB

## 2022-02-24 ENCOUNTER — Emergency Department: Payer: Medicare Other

## 2022-02-24 ENCOUNTER — Encounter: Payer: Self-pay | Admitting: Emergency Medicine

## 2022-02-24 ENCOUNTER — Inpatient Hospital Stay
Admit: 2022-02-24 | Discharge: 2022-02-24 | Disposition: A | Discharge status: Home / Self Care | Payer: Medicare Other | Attending: Internal Medicine | Admitting: Internal Medicine

## 2022-02-24 ENCOUNTER — Inpatient Hospital Stay
Admission: EM | Admit: 2022-02-24 | Discharge: 2022-03-20 | DRG: 163 | Disposition: A | Discharge status: Other Acute Inpatient Hospital | Payer: Medicare Other | Attending: Internal Medicine | Admitting: Internal Medicine

## 2022-02-24 ENCOUNTER — Inpatient Hospital Stay: Payer: Medicare Other

## 2022-02-24 ENCOUNTER — Other Ambulatory Visit: Payer: Self-pay

## 2022-02-24 DIAGNOSIS — L89896 Pressure-induced deep tissue damage of other site: Secondary | ICD-10-CM | POA: Diagnosis present

## 2022-02-24 DIAGNOSIS — G2581 Restless legs syndrome: Secondary | ICD-10-CM | POA: Diagnosis present

## 2022-02-24 DIAGNOSIS — A4189 Other specified sepsis: Secondary | ICD-10-CM | POA: Diagnosis not present

## 2022-02-24 DIAGNOSIS — Z993 Dependence on wheelchair: Secondary | ICD-10-CM

## 2022-02-24 DIAGNOSIS — D62 Acute posthemorrhagic anemia: Secondary | ICD-10-CM | POA: Diagnosis not present

## 2022-02-24 DIAGNOSIS — I82453 Acute embolism and thrombosis of peroneal vein, bilateral: Secondary | ICD-10-CM | POA: Diagnosis present

## 2022-02-24 DIAGNOSIS — I110 Hypertensive heart disease with heart failure: Secondary | ICD-10-CM | POA: Diagnosis present

## 2022-02-24 DIAGNOSIS — R509 Fever, unspecified: Secondary | ICD-10-CM

## 2022-02-24 DIAGNOSIS — F32A Depression, unspecified: Secondary | ICD-10-CM | POA: Diagnosis present

## 2022-02-24 DIAGNOSIS — G35 Multiple sclerosis: Secondary | ICD-10-CM | POA: Diagnosis present

## 2022-02-24 DIAGNOSIS — J9601 Acute respiratory failure with hypoxia: Secondary | ICD-10-CM

## 2022-02-24 DIAGNOSIS — Y95 Nosocomial condition: Secondary | ICD-10-CM | POA: Diagnosis not present

## 2022-02-24 DIAGNOSIS — L89890 Pressure ulcer of other site, unstageable: Secondary | ICD-10-CM | POA: Diagnosis present

## 2022-02-24 DIAGNOSIS — R0602 Shortness of breath: Secondary | ICD-10-CM | POA: Diagnosis present

## 2022-02-24 DIAGNOSIS — Z8616 Personal history of COVID-19: Secondary | ICD-10-CM

## 2022-02-24 DIAGNOSIS — R1319 Other dysphagia: Secondary | ICD-10-CM | POA: Diagnosis not present

## 2022-02-24 DIAGNOSIS — K219 Gastro-esophageal reflux disease without esophagitis: Secondary | ICD-10-CM | POA: Diagnosis present

## 2022-02-24 DIAGNOSIS — D72821 Monocytosis (symptomatic): Secondary | ICD-10-CM | POA: Diagnosis not present

## 2022-02-24 DIAGNOSIS — Z66 Do not resuscitate: Secondary | ICD-10-CM | POA: Diagnosis not present

## 2022-02-24 DIAGNOSIS — J95851 Ventilator associated pneumonia: Secondary | ICD-10-CM | POA: Diagnosis not present

## 2022-02-24 DIAGNOSIS — R21 Rash and other nonspecific skin eruption: Secondary | ICD-10-CM | POA: Diagnosis not present

## 2022-02-24 DIAGNOSIS — Z888 Allergy status to other drugs, medicaments and biological substances status: Secondary | ICD-10-CM

## 2022-02-24 DIAGNOSIS — D72829 Elevated white blood cell count, unspecified: Secondary | ICD-10-CM

## 2022-02-24 DIAGNOSIS — R739 Hyperglycemia, unspecified: Secondary | ICD-10-CM | POA: Diagnosis present

## 2022-02-24 DIAGNOSIS — Z7901 Long term (current) use of anticoagulants: Secondary | ICD-10-CM

## 2022-02-24 DIAGNOSIS — R000 Tachycardia, unspecified: Secondary | ICD-10-CM | POA: Diagnosis not present

## 2022-02-24 DIAGNOSIS — Z9911 Dependence on respirator [ventilator] status: Secondary | ICD-10-CM | POA: Diagnosis not present

## 2022-02-24 DIAGNOSIS — Z79899 Other long term (current) drug therapy: Secondary | ICD-10-CM | POA: Diagnosis not present

## 2022-02-24 DIAGNOSIS — Z634 Disappearance and death of family member: Secondary | ICD-10-CM

## 2022-02-24 DIAGNOSIS — R0489 Hemorrhage from other sites in respiratory passages: Secondary | ICD-10-CM | POA: Diagnosis present

## 2022-02-24 DIAGNOSIS — R5381 Other malaise: Secondary | ICD-10-CM | POA: Diagnosis present

## 2022-02-24 DIAGNOSIS — R042 Hemoptysis: Secondary | ICD-10-CM | POA: Diagnosis present

## 2022-02-24 DIAGNOSIS — Z791 Long term (current) use of non-steroidal anti-inflammatories (NSAID): Secondary | ICD-10-CM

## 2022-02-24 DIAGNOSIS — J690 Pneumonitis due to inhalation of food and vomit: Secondary | ICD-10-CM | POA: Diagnosis present

## 2022-02-24 DIAGNOSIS — G9341 Metabolic encephalopathy: Secondary | ICD-10-CM | POA: Diagnosis present

## 2022-02-24 DIAGNOSIS — Y848 Other medical procedures as the cause of abnormal reaction of the patient, or of later complication, without mention of misadventure at the time of the procedure: Secondary | ICD-10-CM | POA: Diagnosis not present

## 2022-02-24 DIAGNOSIS — J189 Pneumonia, unspecified organism: Secondary | ICD-10-CM | POA: Insufficient documentation

## 2022-02-24 DIAGNOSIS — T4275XA Adverse effect of unspecified antiepileptic and sedative-hypnotic drugs, initial encounter: Secondary | ICD-10-CM | POA: Diagnosis not present

## 2022-02-24 DIAGNOSIS — J95821 Acute postprocedural respiratory failure: Secondary | ICD-10-CM | POA: Diagnosis not present

## 2022-02-24 DIAGNOSIS — K449 Diaphragmatic hernia without obstruction or gangrene: Secondary | ICD-10-CM | POA: Diagnosis present

## 2022-02-24 DIAGNOSIS — K5909 Other constipation: Secondary | ICD-10-CM | POA: Diagnosis present

## 2022-02-24 DIAGNOSIS — R7303 Prediabetes: Secondary | ICD-10-CM | POA: Diagnosis present

## 2022-02-24 DIAGNOSIS — I2699 Other pulmonary embolism without acute cor pulmonale: Secondary | ICD-10-CM | POA: Diagnosis not present

## 2022-02-24 DIAGNOSIS — Z9181 History of falling: Secondary | ICD-10-CM

## 2022-02-24 DIAGNOSIS — A419 Sepsis, unspecified organism: Secondary | ICD-10-CM | POA: Diagnosis not present

## 2022-02-24 DIAGNOSIS — G928 Other toxic encephalopathy: Secondary | ICD-10-CM | POA: Diagnosis not present

## 2022-02-24 DIAGNOSIS — I952 Hypotension due to drugs: Secondary | ICD-10-CM | POA: Diagnosis not present

## 2022-02-24 DIAGNOSIS — M6289 Other specified disorders of muscle: Secondary | ICD-10-CM | POA: Diagnosis not present

## 2022-02-24 DIAGNOSIS — R4182 Altered mental status, unspecified: Secondary | ICD-10-CM | POA: Diagnosis not present

## 2022-02-24 DIAGNOSIS — F419 Anxiety disorder, unspecified: Secondary | ICD-10-CM | POA: Diagnosis present

## 2022-02-24 DIAGNOSIS — I5033 Acute on chronic diastolic (congestive) heart failure: Secondary | ICD-10-CM | POA: Diagnosis present

## 2022-02-24 DIAGNOSIS — R1110 Vomiting, unspecified: Secondary | ICD-10-CM | POA: Diagnosis not present

## 2022-02-24 DIAGNOSIS — Z881 Allergy status to other antibiotic agents status: Secondary | ICD-10-CM

## 2022-02-24 DIAGNOSIS — D72828 Other elevated white blood cell count: Secondary | ICD-10-CM | POA: Diagnosis not present

## 2022-02-24 DIAGNOSIS — K2289 Other specified disease of esophagus: Secondary | ICD-10-CM | POA: Diagnosis present

## 2022-02-24 DIAGNOSIS — R197 Diarrhea, unspecified: Secondary | ICD-10-CM | POA: Diagnosis present

## 2022-02-24 DIAGNOSIS — R1310 Dysphagia, unspecified: Secondary | ICD-10-CM | POA: Diagnosis present

## 2022-02-24 DIAGNOSIS — Z9889 Other specified postprocedural states: Secondary | ICD-10-CM | POA: Diagnosis not present

## 2022-02-24 LAB — BASIC METABOLIC PANEL
Anion gap: 9 (ref 5–15)
BUN: 14 mg/dL (ref 6–20)
CO2: 23 mmol/L (ref 22–32)
Calcium: 9.2 mg/dL (ref 8.9–10.3)
Chloride: 106 mmol/L (ref 98–111)
Creatinine, Ser: 0.62 mg/dL (ref 0.44–1.00)
GFR, Estimated: >60 mL/min (ref >60)
Glucose, Bld: 106 mg/dL — ABNORMAL HIGH (ref 70–99)
Potassium: 3.7 mmol/L (ref 3.5–5.1)
Sodium: 138 mmol/L (ref 135–145)

## 2022-02-24 LAB — TROPONIN I (HIGH SENSITIVITY): Troponin I (High Sensitivity): 9 ng/L (ref <18)

## 2022-02-24 LAB — CBC
HCT: 29.6 % — ABNORMAL LOW (ref 36.0–46.0)
Hemoglobin: 9.3 g/dL — ABNORMAL LOW (ref 12.0–15.0)
MCH: 28.8 pg (ref 26.0–34.0)
MCHC: 31.4 g/dL (ref 30.0–36.0)
MCV: 91.6 fL (ref 80.0–100.0)
Platelets: 231 10*3/uL (ref 150–400)
RBC: 3.23 MIL/uL — ABNORMAL LOW (ref 3.87–5.11)
RDW: 20.6 % — ABNORMAL HIGH (ref 11.5–15.5)
WBC: 15.7 10*3/uL — ABNORMAL HIGH (ref 4.0–10.5)
nRBC: 0 % (ref 0.0–0.2)

## 2022-02-24 LAB — LACTIC ACID, PLASMA
Lactic Acid, Venous: 1.9 mmol/L (ref 0.5–1.9)
Lactic Acid, Venous: 1 mmol/L (ref 0.5–1.9)

## 2022-02-24 LAB — PROTIME-INR
INR: 1.3 — ABNORMAL HIGH (ref 0.8–1.2)
Prothrombin Time: 15.6 seconds — ABNORMAL HIGH (ref 11.4–15.2)

## 2022-02-24 LAB — PROCALCITONIN: Procalcitonin: 0.23 ng/mL

## 2022-02-24 LAB — APTT: aPTT: 47 seconds — ABNORMAL HIGH (ref 24–36)

## 2022-02-24 LAB — ECHOCARDIOGRAM COMPLETE
S' Lateral: 2.5 cm
Weight: 2017.65 oz

## 2022-02-24 LAB — BRAIN NATRIURETIC PEPTIDE: B Natriuretic Peptide: 46.2 pg/mL (ref 0.0–100.0)

## 2022-02-24 MED ORDER — ACETAMINOPHEN 650 MG RE SUPP
650.0000 mg | Freq: Four times a day (QID) | RECTAL | Status: DC | PRN
  Administered 2022-02-25 (×2): 650 mg via RECTAL
  Filled 2022-02-24: qty 2
  Filled 2022-02-24: qty 1

## 2022-02-24 MED ORDER — HEPARIN BOLUS VIA INFUSION
4000.0000 [IU] | Freq: Once | INTRAVENOUS | Status: AC
  Administered 2022-02-24: 4000 [IU] via INTRAVENOUS
  Filled 2022-02-24: qty 4000

## 2022-02-24 MED ORDER — PANTOPRAZOLE SODIUM 40 MG PO TBEC
40.0000 mg | DELAYED_RELEASE_TABLET | Freq: Every day | ORAL | Status: DC
  Administered 2022-02-25 (×2): 40 mg via ORAL
  Filled 2022-02-24 (×2): qty 1

## 2022-02-24 MED ORDER — ONDANSETRON HCL 4 MG PO TABS: 4.0000 mg | ORAL_TABLET | Freq: Four times a day (QID) | ORAL | Status: AC | PRN

## 2022-02-24 MED ORDER — ONDANSETRON HCL 4 MG/2ML IJ SOLN
4.0000 mg | Freq: Four times a day (QID) | INTRAMUSCULAR | Status: AC | PRN
  Administered 2022-02-24 – 2022-02-25 (×2): 4 mg via INTRAVENOUS
  Filled 2022-02-24 (×2): qty 2

## 2022-02-24 MED ORDER — ACETAMINOPHEN 325 MG PO TABS
650.0000 mg | ORAL_TABLET | Freq: Four times a day (QID) | ORAL | Status: DC | PRN
  Administered 2022-02-25: 650 mg via ORAL
  Filled 2022-02-24: qty 2

## 2022-02-24 MED ORDER — GABAPENTIN 400 MG PO CAPS
800.0000 mg | ORAL_CAPSULE | Freq: Every day | ORAL | Status: DC
  Administered 2022-02-25: 800 mg via ORAL
  Filled 2022-02-24: qty 2

## 2022-02-24 MED ORDER — GABAPENTIN 400 MG PO CAPS
400.0000 mg | ORAL_CAPSULE | Freq: Two times a day (BID) | ORAL | Status: DC
  Administered 2022-02-25: 400 mg via ORAL
  Filled 2022-02-24: qty 1

## 2022-02-24 MED ORDER — DULOXETINE HCL 30 MG PO CPEP
30.0000 mg | ORAL_CAPSULE | Freq: Every day | ORAL | Status: DC
  Administered 2022-02-25 (×2): 30 mg via ORAL
  Filled 2022-02-24 (×2): qty 1

## 2022-02-24 MED ORDER — DOXYCYCLINE HYCLATE 100 MG PO TABS
100.0000 mg | ORAL_TABLET | Freq: Once | ORAL | Status: AC
  Administered 2022-02-24: 100 mg via ORAL
  Filled 2022-02-24: qty 1

## 2022-02-24 MED ORDER — TRAMADOL HCL 50 MG PO TABS
50.0000 mg | ORAL_TABLET | Freq: Three times a day (TID) | ORAL | Status: DC | PRN
  Administered 2022-02-25: 50 mg via ORAL
  Filled 2022-02-24: qty 1

## 2022-02-24 MED ORDER — PREGABALIN 75 MG PO CAPS
75.0000 mg | ORAL_CAPSULE | Freq: Three times a day (TID) | ORAL | Status: DC
  Administered 2022-02-25 (×2): 75 mg via ORAL
  Filled 2022-02-24 (×3): qty 1

## 2022-02-24 MED ORDER — VANCOMYCIN HCL IN DEXTROSE 1-5 GM/200ML-% IV SOLN
1000.0000 mg | Freq: Once | INTRAVENOUS | Status: AC
  Administered 2022-02-24: 1000 mg via INTRAVENOUS
  Filled 2022-02-24 (×2): qty 200

## 2022-02-24 MED ORDER — ROPINIROLE HCL 0.25 MG PO TABS
0.5000 mg | ORAL_TABLET | Freq: Every day | ORAL | Status: DC
  Administered 2022-02-25: 0.5 mg via ORAL
  Filled 2022-02-24 (×2): qty 2

## 2022-02-24 MED ORDER — GABAPENTIN 300 MG PO CAPS: 400.0000 mg | ORAL_CAPSULE | ORAL | Status: DC

## 2022-02-24 MED ORDER — VITAMIN B-12 1000 MCG PO TABS
1000.0000 ug | ORAL_TABLET | Freq: Every day | ORAL | Status: DC
  Administered 2022-02-25 – 2022-02-27 (×2): 1000 ug via ORAL
  Filled 2022-02-24 (×3): qty 1

## 2022-02-24 MED ORDER — PROPRANOLOL HCL 20 MG PO TABS
20.0000 mg | ORAL_TABLET | Freq: Two times a day (BID) | ORAL | Status: DC
  Administered 2022-02-25 – 2022-02-27 (×3): 20 mg via ORAL
  Filled 2022-02-24 (×4): qty 1

## 2022-02-24 MED ORDER — SENNOSIDES-DOCUSATE SODIUM 8.6-50 MG PO TABS: 1.0000 | ORAL_TABLET | Freq: Every evening | ORAL | Status: DC | PRN

## 2022-02-24 MED ORDER — DALFAMPRIDINE ER 10 MG PO TB12: 10.0000 mg | ORAL_TABLET | Freq: Every day | ORAL | Status: DC

## 2022-02-24 MED ORDER — HEPARIN (PORCINE) 25000 UT/250ML-% IV SOLN
950.0000 [IU]/h | INTRAVENOUS | Status: DC
  Administered 2022-02-24: 950 [IU]/h via INTRAVENOUS
  Filled 2022-02-24: qty 250

## 2022-02-24 MED ORDER — IOHEXOL 350 MG/ML SOLN
75.0000 mL | Freq: Once | INTRAVENOUS | Status: AC | PRN
  Administered 2022-02-24: 75 mL via INTRAVENOUS

## 2022-02-24 MED ORDER — SODIUM CHLORIDE 0.9 % IV SOLN
2.0000 g | Freq: Once | INTRAVENOUS | Status: AC
  Administered 2022-02-24: 2 g via INTRAVENOUS
  Filled 2022-02-24: qty 20

## 2022-02-24 MED ORDER — LACTATED RINGERS IV SOLN: INTRAVENOUS | Status: DC

## 2022-02-24 NOTE — Progress Notes (Signed)
Unable to get blood cultures at this time, antibiotics hung

## 2022-02-24 NOTE — Consult Note (Signed)
PHARMACY -  BRIEF ANTIBIOTIC NOTE   Pharmacy has received consult(s) for vancomycin from an ED provider.  The patient's profile has been reviewed for ht/wt/allergies/indication/available labs.    One time order(s) placed for vancomycin 1 gram IV x 1  Further antibiotics/pharmacy consults should be ordered by admitting physician if indicated.                       Thank you, Barrie Folk 02/24/2022  5:58 PM

## 2022-02-24 NOTE — Hospital Course (Signed)
[name] is a 60 year old female with history of hypertension, depression, anxiety, restless leg syndrome, GERD, who presents emergency department for chief concerns of shortness of breath via POV.  Initial vitals in the emergency department showed temperature of 99.1, respiration rate of 20, heart rate of 140, blood pressure 124/59, SPO2 of 94% on room air.  Serum sodium is 138, potassium 2.7, chloride 106, bicarb 23, BUN of 14, serum creatinine 0.62, EGFR greater than 60, nonfasting blood glucose 106, WBC 15.7, hemoglobin 9.3, platelets of 231.  CTA PE: Was read as acute PE with moderate thrombus burden involving both lungs.  RV-LV ratio is 1.3 suggesting acute or chronic right heart dysfunction.  Small right pleural effusion.  Patchy infiltrates in the perihilar regions of both lung fields, right more than left.  Moderate to large size fixed hiatal hernia.  ED treatment: Ceftriaxone 2 g IV, vancomycin, LR 150 mL bolus, doxycycline.

## 2022-02-24 NOTE — Assessment & Plan Note (Signed)
-   Patient states that she has been prescribed propanolol 20 mg twice daily for this, she states that she missed her a.m. dose due to feeling unwell

## 2022-02-24 NOTE — Consult Note (Signed)
ANTICOAGULATION CONSULT NOTE - Initial Consult  Pharmacy Consult for heparin drip Indication: pulmonary embolus  Allergies  Allergen Reactions   Erythromycin Hives and Nausea And Vomiting   Lexapro [Escitalopram Oxalate] Hives    Patient Measurements: Weight: 57.2 kg (126 lb 1.7 oz) (from office visit on 02/06/22) Heparin Dosing Weight: 57.2 kg  Vital Signs: Temp: 99.1 F (37.3 C) (11/14 1535) Temp Source: Oral (11/14 1535) BP: 150/74 (11/14 1710) Pulse Rate: 138 (11/14 1710)  Labs: Recent Labs    02/24/22 1541 02/24/22 1542  HGB 9.3*  --   HCT 29.6*  --   PLT 231  --   CREATININE 0.62  --   TROPONINIHS  --  9    Estimated Creatinine Clearance: 60.6 mL/min (by C-G formula based on SCr of 0.62 mg/dL).  Baseline PT/INR and aPTT are pending.   Medical History: Past Medical History:  Diagnosis Date   GERD (gastroesophageal reflux disease)    Multiple sclerosis (HCC)     Medications:  No home anticoagulation per pharmacist review.  Assessment: 61 yo female presented to ED with SOB when lying flat and some right sided rib and shoulder pain.  Chest CT showed acute PE.  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  Goal of Therapy:  Heparin level 0.3-0.7 units/ml Monitor platelets by anticoagulation protocol: Yes   Plan:  Give 4000 units bolus x 1 Start heparin infusion at 950 units/hr Check anti-Xa level in 6 hours and daily while on heparin Continue to monitor H&H and platelets  Barrie Folk 02/24/2022,5:48 PM

## 2022-02-24 NOTE — ED Triage Notes (Signed)
Patient to ED via POV for SOB. Patient state the SOB is only when she lays down. Patient also c/o right sided rib and shoulder pain. Patient does not have energy like normal. Pt states she has not taken her propranolol for HR today. Patient tested positive for covid 4 weeks ago. Hx of MS- wheelchair bound.

## 2022-02-24 NOTE — Assessment & Plan Note (Signed)
-   Senna docusate nightly as needed ordered

## 2022-02-24 NOTE — Progress Notes (Signed)
Notified bedside nurse of need to draw lactic acid and blood cultures.  

## 2022-02-24 NOTE — Progress Notes (Signed)
CODE SEPSIS - PHARMACY COMMUNICATION  **Broad Spectrum Antibiotics should be administered within 1 hour of Sepsis diagnosis**  Time Code Sepsis Called/Page Received: 1720  Antibiotics Ordered: ceftriaxone and vancomycin  Time of 1st antibiotic administration: 1827  Additional action taken by pharmacy: Messaged nurse at 1811  If necessary, Name of Provider/Nurse Contacted: Lilli Light, RN   Barrie Folk ,PharmD Clinical Pharmacist  02/24/2022  5:50 PM

## 2022-02-24 NOTE — Assessment & Plan Note (Signed)
-   Ropinirole 0.5 milligrams p.o. daily

## 2022-02-24 NOTE — Assessment & Plan Note (Signed)
-   PPI resumed °

## 2022-02-24 NOTE — ED Notes (Signed)
Patient transported to CT 

## 2022-02-24 NOTE — Progress Notes (Signed)
Elink following code sepsis °

## 2022-02-24 NOTE — H&P (Signed)
History and Physical   [name] RFF:[id number] DOB: 10/05/1960 DOA: 02/24/2022  PCP: [facility]  Outpatient Specialists:  Patient coming from: Home via [place]  I have personally briefly reviewed patient's old medical records in [place].  Chief Concern: Shortness of breath  HPI: [name] is a 61 year old female with history of hypertension, depression, anxiety, restless leg syndrome, GERD, who presents emergency department for chief concerns of shortness of breath via POV.  Initial vitals in the emergency department showed temperature of 99.1, respiration rate of 20, heart rate of 140, blood pressure 124/59, SPO2 of 94% on room air.  Serum sodium is 138, potassium 2.7, chloride 106, bicarb 23, BUN of 14, serum creatinine 0.62, EGFR greater than 60, nonfasting blood glucose 106, WBC 15.7, hemoglobin 9.3, platelets of 231.  CTA PE: Was read as acute PE with moderate thrombus burden involving both lungs.  RV-LV ratio is 1.3 suggesting acute or chronic right heart dysfunction.  Small right pleural effusion.  Patchy infiltrates in the perihilar regions of both lung fields, right more than left.  Moderate to large size fixed hiatal hernia.  ED treatment: Ceftriaxone 2 g IV, vancomycin, LR 150 mL bolus, doxycycline. --------------------------- At bedside patient was able to tell me her name, her age, the current calendar year and she knows that she is in the hospital.  She reports that she developed shortness of breath 3 to 4 days ago.  She denies known fever at home and trauma to her person.  She denies chest pain, abdominal pain, dysuria, hematuria.  She did have 1 episode of diarrhea today.  She denies blood in her stool.  She endorses having COVID about 4 weeks ago.  She denies recent international travel or long car trips.  She denies history of cancer that she knows of.  She denies recent surgery.  She denies changes to her leg pain.  She states  at baseline she has leg pain because of MS and restless leg syndrome.  Patient reports that her last meal was approximately 8 or 9 AM and numerous piece of toast.  ROS: Constitutional: no weight change, no fever ENT/Mouth: no sore throat, no rhinorrhea Eyes: no eye pain, no vision changes Cardiovascular: no chest pain, + dyspnea,  no edema, no palpitations Respiratory: no cough, no sputum, no wheezing Gastrointestinal: no nausea, no vomiting, no diarrhea, no constipation Genitourinary: no urinary incontinence, no dysuria, no hematuria Musculoskeletal: no arthralgias, no myalgias Skin: no skin lesions, no pruritus, Neuro: + weakness, no loss of consciousness, no syncope Psych: no anxiety, no depression, no decrease appetite Heme/Lymph: no bruising, no bleeding  ED Course: Discussed with emergency medicine provider, patient requiring hospitalization for chief concerns of bilateral PE.  Assessment/Plan  Principal Problem:   Bilateral pulmonary embolism (HCC) Active Problems:   Multiple sclerosis (HCC)   Chronic constipation   Gastroesophageal reflux disease   RLS (restless legs syndrome)   Tachycardia   Assessment and Plan:  * Bilateral pulmonary embolism (HCC) - Continue heparin GTT per pharmacy - Vascular has been consulted via secure chat and epic order - Complete echo ordered - Bilateral lower extremity ultrasound have been ordered to assess for DVT - Admit to progressive cardiac, inpatient  Tachycardia - Patient states that she has been prescribed propanolol 20 mg twice daily for this, she states that she missed her a.m. dose due to feeling unwell  RLS (restless legs syndrome) - Ropinirole 0.5 milligrams p.o. daily  Gastroesophageal reflux disease - PPI resumed  Chronic constipation - Senna docusate nightly as needed ordered  Multiple sclerosis ([place]) - Patient takes dalfampridine 10 mg every 12 hours, gabapentin 400 mg in the morning, 400 mg in the afternoon,  800 mg at night, these have been resumed - Patient takes tolterodine 2 mg daily and this is not available, patient states she will bring this medication to the hospital, pharmacy consultation for reliably ordered   Chart reviewed.   DVT prophylaxis: Heparin GTT Code Status: Full code Diet: N.p.o. except for sips of meds Family Communication: Updated boyfriend/significant other at bedside with patient's permission Disposition Plan: Pending clinical course Consults called: Vascular Admission status: Stepdown, inpatient  Past Medical History:  Diagnosis Date   GERD (gastroesophageal reflux disease)    Multiple sclerosis ([place])    Past Surgical History:  Procedure Laterality Date   ESOPHAGOGASTRODUODENOSCOPY (EGD) WITH PROPOFOL N/A 09/11/2021   Procedure: ESOPHAGOGASTRODUODENOSCOPY (EGD) WITH PROPOFOL;  Surgeon: Annamaria Helling, DO;  Location: [facility] ENDOSCOPY;  Service: Gastroenterology;  Laterality: N/A;   TUBAL LIGATION     Social History:  reports that she has never smoked. She has never used smokeless tobacco. She reports current alcohol use. She reports that she does not use drugs.  Allergies  Allergen Reactions   Erythromycin Hives and Nausea And Vomiting   Lexapro [Escitalopram Oxalate] Hives   History reviewed. No pertinent family history. Family history: Family history reviewed and not pertinent  Prior to Admission medications   Medication Sig Start Date End Date Taking? Authorizing Provider  ascorbic acid (VITAMIN C) 500 MG tablet Take by mouth.    [provider]  Cholecalciferol 50 MCG (2000 UT) CAPS Take by mouth.    [provider]  cyanocobalamin 1000 MCG tablet Take by mouth.    [provider]  cyclobenzaprine (FLEXERIL) 10 MG tablet Take 10 mg by mouth 2 (two) times daily as needed. 11/26/21   [provider]  dalfampridine 10 MG TB12 Take by mouth. 08/14/21   [provider]  dalfampridine 10 MG TB12 Take 1 tablet by  mouth every 12 (twelve) hours. 12/29/21   [provider]  DULoxetine (CYMBALTA) 30 MG capsule Take 30 mg by mouth daily. 06/25/21   [provider]  gabapentin (NEURONTIN) 400 MG capsule Take Gabapentin 400 mg in the morning, 400 mg in the afternoon, and 800 mg at night 12/29/21   [provider]  meloxicam (MOBIC) 15 MG tablet Take 1 tablet (15 mg total) by mouth daily. 08/23/21 08/23/22  Cuthriell, Charline Bills, PA-C  methylPREDNISolone (MEDROL DOSEPAK) 4 MG TBPK tablet Take by mouth. 11/26/21   [provider]  naproxen (NAPROSYN) 500 MG tablet Take by mouth.    [provider]  nitrofurantoin, macrocrystal-monohydrate, (MACROBID) 100 MG capsule Take 1 capsule (100 mg total) by mouth 2 (two) times daily. 01/19/22   Immordino, Annie Main, FNP  omeprazole (PRILOSEC) 20 MG capsule Take 20 mg by mouth daily. 06/10/21   [provider]  pregabalin (LYRICA) 75 MG capsule Take 75 mg by mouth 3 (three) times daily. 08/18/21   [provider]  propranolol (INDERAL) 20 MG tablet Take 1 tablet by mouth 2 (two) times daily. 12/16/21   [provider]  rOPINIRole (REQUIP) 0.25 MG tablet Take 0.25 mg at night for 1 week, then increase to 0.5 mg at night 05/29/21   [provider]  tolterodine (DETROL LA) 2 MG 24 hr capsule Take by mouth. 12/29/21 12/29/22  [provider]  traMADol (ULTRAM) 50 MG  tablet Take 50 mg by mouth 3 (three) times daily as needed. 12/26/21   [provider]   Physical Exam: Vitals:   02/24/22 1710 02/24/22 1829 02/24/22 1935 02/24/22 2100  BP: (!) 150/74  (!) 155/67 (!) 140/67  Pulse: (!) 138  (!) 146 (!) 159  Resp: (!) 29  (!) 26 (!) 30  Temp:  98.3 F (36.8 C)    TempSrc:  Oral    SpO2: 93%  100% 93%  Weight: 57.2 kg      Constitutional: appears , NAD, calm, comfortable Eyes: PERRL, lids and conjunctivae normal ENMT: Mucous membranes are moist. Posterior pharynx clear of any exudate or lesions.  Age-appropriate dentition. Hearing appropriate Neck: normal, supple, no masses, no thyromegaly Respiratory: clear to auscultation bilaterally, no wheezing, no crackles. Normal respiratory effort. No accessory muscle use.  Cardiovascular: Regular rate and rhythm, no murmurs / rubs / gallops. No extremity edema. 2+ pedal pulses. No carotid bruits.  Abdomen: no tenderness, no masses palpated, no hepatosplenomegaly. Bowel sounds positive.  Musculoskeletal: no clubbing / cyanosis. No joint deformity upper and lower extremities. Good ROM, no contractures, no atrophy. Normal muscle tone.  Skin: no rashes, lesions, ulcers. No induration Neurologic: Sensation intact. Strength 5/5 in all 4.  Psychiatric: Normal judgment and insight. Alert and oriented x 3. Depressed mood. Tearful  EKG: independently reviewed, showing sinus tachycardia with rate of 139, QTc 419  Chest x-ray on Admission: I personally reviewed and I agree with radiologist reading as below.  US Venous Img Lower Bilateral (DVT)  Result Date: 02/24/2022 CLINICAL DATA:  History of pulmonary embolus. EXAM: BILATERAL LOWER EXTREMITY VENOUS DOPPLER ULTRASOUND TECHNIQUE: Gray-scale sonography with graded compression, as well as color Doppler and duplex ultrasound were performed to evaluate the lower extremity deep venous systems from the level of the common femoral vein and including the common femoral, femoral, profunda femoral, popliteal and calf veins including the posterior tibial, peroneal and gastrocnemius veins when visible. The superficial great saphenous vein was also interrogated. Spectral Doppler was utilized to evaluate flow at rest and with distal augmentation maneuvers in the common femoral, femoral and popliteal veins. COMPARISON:  CT scan of same day. FINDINGS: RIGHT LOWER EXTREMITY Common Femoral Vein: No evidence of thrombus. Normal compressibility, respiratory phasicity and response to augmentation. Saphenofemoral Junction: No  evidence of thrombus. Normal compressibility and flow on color Doppler imaging. Profunda Femoral Vein: No evidence of thrombus. Normal compressibility and flow on color Doppler imaging. Femoral Vein: No evidence of thrombus. Normal compressibility, respiratory phasicity and response to augmentation. Popliteal Vein: No evidence of thrombus. Normal compressibility, respiratory phasicity and response to augmentation. Calf Veins: There appears to be occlusive thrombus in the peroneal vein. Venous Reflux:  None. Other Findings:  None. LEFT LOWER EXTREMITY Common Femoral Vein: No evidence of thrombus. Normal compressibility, respiratory phasicity and response to augmentation. Saphenofemoral Junction: No evidence of thrombus. Normal compressibility and flow on color Doppler imaging. Profunda Femoral Vein: No evidence of thrombus. Normal compressibility and flow on color Doppler imaging. Femoral Vein: No evidence of thrombus. Normal compressibility, respiratory phasicity and response to augmentation. Popliteal Vein: No evidence of thrombus. Normal compressibility, respiratory phasicity and response to augmentation. Calf Veins: There appears to be occlusive thrombus in the peroneal vein. Superficial Great Saphenous Vein: No evidence of thrombus. Normal compressibility. Venous Reflux:  None. Other Findings:  None. IMPRESSION: Occlusive thrombus is noted in the peroneal veins bilaterally. Electronically Signed   By: Marijo Conception M.D.   On: 02/24/2022 18:54  CT Angio Chest PE W and/or Wo Contrast  Result Date: 02/24/2022 CLINICAL DATA:  Shortness of breath, right chest pain EXAM: CT ANGIOGRAPHY CHEST WITH CONTRAST TECHNIQUE: Multidetector CT imaging of the chest was performed using the standard protocol during bolus administration of intravenous contrast. Multiplanar CT image reconstructions and MIPs were obtained to evaluate the vascular anatomy. RADIATION DOSE REDUCTION: This exam was performed according to the  departmental dose-optimization program which includes automated exposure control, adjustment of the mA and/or kV according to patient size and/or use of iterative reconstruction technique. CONTRAST:  58m OMNIPAQUE IOHEXOL 350 MG/ML SOLN COMPARISON:  Chest radiograph done earlier today FINDINGS: Cardiovascular: Heart is enlarged in size. RV LV ratio is 1.3. This may be recent or chronic. There is homogeneous enhancement in thoracic aorta. There are filling defects in segmental and subsegmental branches in right upper lobe, right middle lobe, right lower lobe, left upper lobe and left lower lobe. There is moderate thrombus burden. Mediastinum/Nodes: No significant lymphadenopathy seen. Lungs/Pleura: There are patchy infiltrates in parahilar regions and lower lung fields, more so in right lower lobe. There is small right pleural effusion. There is no pneumothorax. Upper Abdomen: There is moderate to large sized fixed hiatal hernia. Musculoskeletal: No acute findings are seen. Review of the MIP images confirms the above findings. IMPRESSION: Acute PE with moderate thrombus burden involving both lungs. RV-LV ratio is 1.3 suggesting acute or chronic right heart dysfunction. Small right pleural effusion. There are patchy infiltrates in parahilar regions and both lower lung fields, more so in right lower lobe suggesting atelectasis/pneumonia. Part of this finding could be due to developing pulmonary infarcts. Moderate to large sized fixed hiatal hernia. Imaging findings were relayed to patient's provider Dr. MMarjean Donnaby telephone call. Electronically Signed   By: PElmer PickerM.D.   On: 02/24/2022 17:13   DG Chest 2 View  Result Date: 02/24/2022 CLINICAL DATA:  Short of breath EXAM: CHEST - 2 VIEW COMPARISON:  None Available. FINDINGS: Heart size and vascularity normal. Negative for heart failure. Mild bibasilar airspace disease likely atelectasis. Small right pleural effusion. IMPRESSION: Mild bibasilar  atelectasis.  Small right pleural effusion Electronically Signed   By: CFranchot GalloM.D.   On: 02/24/2022 16:05    Labs on Admission: I have personally reviewed following labs  CBC: Recent Labs  Lab 02/24/22 1541  WBC 15.7*  HGB 9.3*  HCT 29.6*  MCV 91.6  PLT 2371  Basic Metabolic Panel: Recent Labs  Lab 02/24/22 1541  NA 138  K 3.7  CL 106  CO2 23  GLUCOSE 106*  BUN 14  CREATININE 0.62  CALCIUM 9.2   GFR: Estimated Creatinine Clearance: 60.6 mL/min (by C-G formula based on SCr of 0.62 mg/dL).  Urine analysis:    Component Value Date/Time   BILIRUBINUR negative 01/19/2022 1754   KETONESUR negative 01/19/2022 1754   PROTEINUR negative 01/19/2022 1754   UROBILINOGEN 0.2 01/19/2022 1754   NITRITE Negative 01/19/2022 1754   LEUKOCYTESUR Small (1+) (A) 01/19/2022 1754   Dr. CTobie PoetTriad Hospitalists  If 7PM-7AM, please contact overnight-coverage provider If 7AM-7PM, please contact day coverage provider [email or web address]  02/24/2022, 9:32 PM

## 2022-02-24 NOTE — ED Notes (Signed)
Two RNs attempted to obtain blood cultures and lactic w/o success.  Lab notified.

## 2022-02-24 NOTE — Assessment & Plan Note (Signed)
-   Continue heparin GTT per pharmacy - Vascular has been consulted via secure chat and epic order - Complete echo ordered - Bilateral lower extremity ultrasound have been ordered to assess for DVT - Admit to progressive cardiac, inpatient

## 2022-02-24 NOTE — Assessment & Plan Note (Addendum)
-   Patient takes dalfampridine 10 mg every 12 hours, gabapentin 400 mg in the morning, 400 mg in the afternoon, 800 mg at night, these have been resumed - Patient takes tolterodine 2 mg daily and this is not available, patient states she will bring this medication to the hospital, pharmacy consultation for reliably ordered

## 2022-02-24 NOTE — ED Provider Notes (Signed)
[name] Provider Note    Event Date/Time   First MD Initiated Contact with Patient 02/24/22 1608     (approximate)   History   Shortness of Breath   HPI  [name] is a 61 y.o. female who comes in with shortness of breath with laying flat.  Also with some right-sided rib and shoulder pain.  She is not taking her normal propranolol for her heart rate today.  Patient does report being positive for COVID 4 weeks ago and being nonambulatory secondary to MS. patient denies any history of blood clots or history of being on any blood thinners.  She reports that she is had this for the past few days where she tries to lay flat and she feels short of breath like she cannot breathe and has a little bit of right shoulder pain.  She denies any swelling in her legs, abdominal pain, recent falls and hitting her head.  She denies any vomiting blood or black tarry stools.   Physical Exam   Triage Vital Signs: ED Triage Vitals  Enc Vitals Group     BP 02/24/22 1535 (!) 124/59     Pulse Rate 02/24/22 1535 (!) 140     Resp 02/24/22 1535 20     Temp 02/24/22 1535 99.1 F (37.3 C)     Temp Source 02/24/22 1535 Oral     SpO2 02/24/22 1535 94 %     Weight --      Height --      Head Circumference --      Peak Flow --      Pain Score 02/24/22 1539 2     Pain Loc --      Pain Edu? --      Excl. in GC? --     Most recent vital signs: Vitals:   02/24/22 1535  BP: (!) 124/59  Pulse: (!) 140  Resp: 20  Temp: 99.1 F (37.3 C)  SpO2: 94%     General: Awake, no distress.  CV:  Good peripheral perfusion.  Resp:  Normal effort.  Abd:  No distention.  Soft and nontender Other:  No significant swelling in legs.  No calf tenderness   ED Results / Procedures / Treatments   Labs (all labs ordered are listed, but only abnormal results are displayed) Labs Reviewed  BASIC METABOLIC PANEL - Abnormal; Notable for the following components:      Result  Value   Glucose, Bld 106 (*)    All other components within normal limits  CBC - Abnormal; Notable for the following components:   WBC 15.7 (*)    RBC 3.23 (*)    Hemoglobin 9.3 (*)    HCT 29.6 (*)    RDW 20.6 (*)    All other components within normal limits  POC URINE PREG, ED  TROPONIN I (HIGH SENSITIVITY)     EKG  My interpretation of EKG:  Sinus tachycardia rate of 139 without any [name] elevation, T wave version lead III with an S1Q3T3, T wave version aVF, normal intervals  RADIOLOGY I have reviewed the xray personally and interpreted and patient has small right pleural effusion  PROCEDURES:  Critical Care performed: Yes, see critical care procedure note(s)  .1-3 Lead EKG Interpretation  Performed by: Concha Se, MD Authorized by: Concha Se, MD     Interpretation: abnormal     ECG rate:  140   ECG rate assessment: tachycardic  Rhythm: sinus tachycardia     Ectopy: none     Conduction: normal   .Critical Care  Performed by: Concha Se, MD Authorized by: Concha Se, MD   Critical care provider statement:    Critical care time (minutes):  30   Critical care was necessary to treat or prevent imminent or life-threatening deterioration of the following conditions:  Respiratory failure   Critical care was time spent personally by me on the following activities:  Development of treatment plan with patient or surrogate, discussions with consultants, evaluation of patient's response to treatment, examination of patient, ordering and review of laboratory studies, ordering and review of radiographic studies, ordering and performing treatments and interventions, pulse oximetry, re-evaluation of patient's condition and review of old charts    MEDICATIONS ORDERED IN ED: Medications  lactated ringers infusion (has no administration in time range)  vancomycin (VANCOCIN) IVPB 1000 mg/200 mL premix (has no administration in time range)  cefTRIAXone (ROCEPHIN) 2 g in  sodium chloride 0.9 % 100 mL IVPB (has no administration in time range)  doxycycline (VIBRA-TABS) tablet 100 mg (has no administration in time range)  acetaminophen (TYLENOL) tablet 650 mg (has no administration in time range)    Or  acetaminophen (TYLENOL) suppository 650 mg (has no administration in time range)  ondansetron (ZOFRAN) tablet 4 mg (has no administration in time range)    Or  ondansetron (ZOFRAN) injection 4 mg (has no administration in time range)  senna-docusate (Senokot-S) tablet 1 tablet (has no administration in time range)  propranolol (INDERAL) tablet 20 mg (has no administration in time range)  iohexol (OMNIPAQUE) 350 MG/ML injection 75 mL (75 mLs Intravenous Contrast Given 02/24/22 1646)     IMPRESSION / MDM / ASSESSMENT AND PLAN / ED COURSE  I reviewed the triage vital signs and the nursing notes.   Patient's presentation is most consistent with acute presentation with potential threat to life or bodily function.    Patient comes in with shortness of breath and tachycardia differential is CHF, PE, ACS.  Patient recently had COVID so unlikely to still be COVID.  She looks like on review of records she takes propranolol but she missed her dose today which could be leading to some of her tachycardia.  If CT is negative for PE will give a dose of propranolol   CBC shows white count elevation to 15.7.  Hemoglobin to 9.3.  I reviewed her prior hemoglobin back in February 2023 and it was 13.5 BMP shows normal creatinine  Troponin negative.  CT scan concerning for PE with right heart strain possible infarct versus pneumonia.  Given recent COVID I got blood cultures, lactate and started patient on antibiotics hold off on significant fluid resuscitation due to concern for PE and that could worsen it  Also held her propranolol for now.  I discussed with Dr. Wyn Quaker from vascular who will review her stuff to see if she be a candidate for thrombectomy.  I placed her on heparin  and will have to closely monitor her hemoglobin levels given slightly low but she denies any black and tarry stool.  I have discussed with Dr. Sedalia Muta for admission    The patient is on the cardiac monitor to evaluate for evidence of arrhythmia and/or significant heart rate changes.      FINAL CLINICAL IMPRESSION(S) / ED DIAGNOSES   Final diagnoses:  Sepsis, due to unspecified organism, unspecified whether acute organ dysfunction present [facility])  Other acute pulmonary embolism, unspecified whether  acute cor pulmonale present (HCC)     Rx / DC Orders   ED Discharge Orders     None        Note:  This document was prepared using Dragon voice recognition software and may include unintentional dictation errors.   Concha Se, MD 02/24/22 1754

## 2022-02-25 ENCOUNTER — Inpatient Hospital Stay: Payer: Medicare Other

## 2022-02-25 ENCOUNTER — Encounter
Admission: EM | Disposition: A | Discharge status: Nursing Home (Includes ICF) | Payer: Self-pay | Source: Home / Self Care | Attending: Student in an Organized Health Care Education/Training Program

## 2022-02-25 DIAGNOSIS — G35 Multiple sclerosis: Secondary | ICD-10-CM

## 2022-02-25 DIAGNOSIS — I2699 Other pulmonary embolism without acute cor pulmonale: Secondary | ICD-10-CM

## 2022-02-25 DIAGNOSIS — G2581 Restless legs syndrome: Secondary | ICD-10-CM | POA: Diagnosis not present

## 2022-02-25 DIAGNOSIS — K219 Gastro-esophageal reflux disease without esophagitis: Secondary | ICD-10-CM

## 2022-02-25 HISTORY — PX: PULMONARY THROMBECTOMY: CATH118295

## 2022-02-25 LAB — BLOOD GAS, ARTERIAL
Acid-base deficit: 3.1 mmol/L — ABNORMAL HIGH (ref 0.0–2.0)
Bicarbonate: 25 mmol/L (ref 20.0–28.0)
FIO2: 100 %
MECHVT: 450 mL
Mechanical Rate: 10
O2 Saturation: 97.8 %
PEEP: 5 cmH2O
Patient temperature: 37
pCO2 arterial: 57 mmHg — ABNORMAL HIGH (ref 32–48)
pH, Arterial: 7.25 — ABNORMAL LOW (ref 7.35–7.45)
pO2, Arterial: 89 mmHg (ref 83–108)

## 2022-02-25 LAB — CBC
HCT: 25.2 % — ABNORMAL LOW (ref 36.0–46.0)
Hemoglobin: 8 g/dL — ABNORMAL LOW (ref 12.0–15.0)
MCH: 30 pg (ref 26.0–34.0)
MCHC: 31.7 g/dL (ref 30.0–36.0)
MCV: 94.4 fL (ref 80.0–100.0)
Platelets: 206 10*3/uL (ref 150–400)
RBC: 2.67 MIL/uL — ABNORMAL LOW (ref 3.87–5.11)
RDW: 20.9 % — ABNORMAL HIGH (ref 11.5–15.5)
WBC: 17.4 10*3/uL — ABNORMAL HIGH (ref 4.0–10.5)
nRBC: 0 % (ref 0.0–0.2)

## 2022-02-25 LAB — BASIC METABOLIC PANEL
Anion gap: 7 (ref 5–15)
BUN: 12 mg/dL (ref 6–20)
CO2: 23 mmol/L (ref 22–32)
Calcium: 8.1 mg/dL — ABNORMAL LOW (ref 8.9–10.3)
Chloride: 109 mmol/L (ref 98–111)
Creatinine, Ser: 0.73 mg/dL (ref 0.44–1.00)
GFR, Estimated: >60 mL/min (ref >60)
Glucose, Bld: 119 mg/dL — ABNORMAL HIGH (ref 70–99)
Potassium: 3.6 mmol/L (ref 3.5–5.1)
Sodium: 139 mmol/L (ref 135–145)

## 2022-02-25 LAB — HIV ANTIBODY (ROUTINE TESTING W REFLEX): HIV Screen 4th Generation wRfx: NONREACTIVE

## 2022-02-25 LAB — MRSA NEXT GEN BY PCR, NASAL: MRSA by PCR Next Gen: NOT DETECTED

## 2022-02-25 LAB — HEPARIN LEVEL (UNFRACTIONATED)
Heparin Unfractionated: 0.15 IU/mL — ABNORMAL LOW (ref 0.30–0.70)
Heparin Unfractionated: 0.37 IU/mL (ref 0.30–0.70)
Heparin Unfractionated: <0.1 IU/mL — ABNORMAL LOW (ref 0.30–0.70)

## 2022-02-25 LAB — GLUCOSE, CAPILLARY
Glucose-Capillary: 124 mg/dL — ABNORMAL HIGH (ref 70–99)
Glucose-Capillary: 76 mg/dL (ref 70–99)

## 2022-02-25 SURGERY — PULMONARY THROMBECTOMY
Anesthesia: Moderate Sedation | Laterality: Bilateral

## 2022-02-25 MED ORDER — METHYLPREDNISOLONE SODIUM SUCC 40 MG IJ SOLR
40.0000 mg | Freq: Every day | INTRAMUSCULAR | Status: DC
  Administered 2022-02-25 – 2022-02-28 (×4): 40 mg via INTRAVENOUS
  Filled 2022-02-25 (×5): qty 1

## 2022-02-25 MED ORDER — SODIUM BICARBONATE 8.4 % IV SOLN
INTRAVENOUS | Status: AC
  Administered 2022-02-25: 50 meq
  Filled 2022-02-25: qty 50

## 2022-02-25 MED ORDER — DEXMEDETOMIDINE HCL IN NACL 400 MCG/100ML IV SOLN
0.4000 ug/kg/h | INTRAVENOUS | Status: DC
  Administered 2022-02-25: 0.4 ug/kg/h via INTRAVENOUS
  Administered 2022-02-26: 0.5 ug/kg/h via INTRAVENOUS
  Administered 2022-02-26 – 2022-02-27 (×2): 0.6 ug/kg/h via INTRAVENOUS
  Administered 2022-02-27: 1.2 ug/kg/h via INTRAVENOUS
  Filled 2022-02-25 (×5): qty 100

## 2022-02-25 MED ORDER — METHYLPREDNISOLONE SODIUM SUCC 125 MG IJ SOLR: 125.0000 mg | Freq: Once | INTRAMUSCULAR | Status: DC | PRN

## 2022-02-25 MED ORDER — SUCCINYLCHOLINE CHLORIDE 200 MG/10ML IV SOSY
PREFILLED_SYRINGE | INTRAVENOUS | Status: AC
  Administered 2022-02-25: 200 mg
  Filled 2022-02-25: qty 10

## 2022-02-25 MED ORDER — CEFAZOLIN SODIUM-DEXTROSE 1-4 GM/50ML-% IV SOLN
INTRAVENOUS | Status: AC
  Filled 2022-02-25: qty 50

## 2022-02-25 MED ORDER — TOLTERODINE TARTRATE ER 2 MG PO CP24: 2.0000 mg | ORAL_CAPSULE | Freq: Every day | ORAL | Status: DC

## 2022-02-25 MED ORDER — BUDESONIDE 0.5 MG/2ML IN SUSP
0.5000 mg | Freq: Two times a day (BID) | RESPIRATORY_TRACT | Status: DC
  Administered 2022-02-25 – 2022-03-02 (×10): 0.5 mg via RESPIRATORY_TRACT
  Filled 2022-02-25 (×10): qty 2

## 2022-02-25 MED ORDER — MUPIROCIN CALCIUM 2 % EX CREA
TOPICAL_CREAM | Freq: Two times a day (BID) | CUTANEOUS | Status: DC
  Filled 2022-02-25: qty 15

## 2022-02-25 MED ORDER — SODIUM BICARBONATE 8.4 % IV SOLN: 50.0000 meq | Freq: Once | INTRAVENOUS | Status: AC

## 2022-02-25 MED ORDER — FENTANYL 2500MCG IN NS 250ML (10MCG/ML) PREMIX INFUSION: 50.0000 ug/h | INTRAVENOUS | Status: DC

## 2022-02-25 MED ORDER — SODIUM BICARBONATE 8.4 % IV SOLN: 50.0000 meq | Freq: Once | INTRAVENOUS | Status: DC

## 2022-02-25 MED ORDER — LORAZEPAM 2 MG/ML IJ SOLN
0.5000 mg | Freq: Two times a day (BID) | INTRAMUSCULAR | Status: DC | PRN
  Administered 2022-02-25: 0.5 mg via INTRAVENOUS
  Filled 2022-02-25: qty 1

## 2022-02-25 MED ORDER — IPRATROPIUM-ALBUTEROL 0.5-2.5 (3) MG/3ML IN SOLN
3.0000 mL | RESPIRATORY_TRACT | Status: DC
  Administered 2022-02-25 – 2022-03-02 (×30): 3 mL via RESPIRATORY_TRACT
  Filled 2022-02-25 (×30): qty 3

## 2022-02-25 MED ORDER — MIDAZOLAM HCL 2 MG/ML PO SYRP
8.0000 mg | ORAL_SOLUTION | Freq: Once | ORAL | Status: DC | PRN
  Filled 2022-02-25: qty 5

## 2022-02-25 MED ORDER — CEFAZOLIN SODIUM-DEXTROSE 2-4 GM/100ML-% IV SOLN
INTRAVENOUS | Status: AC
  Administered 2022-02-25: 2 g via INTRAVENOUS
  Filled 2022-02-25: qty 100

## 2022-02-25 MED ORDER — SODIUM BICARBONATE 4.2 % IV SOLN: 50.0000 meq | Freq: Once | INTRAVENOUS | Status: DC

## 2022-02-25 MED ORDER — MIDAZOLAM HCL 2 MG/2ML IJ SOLN
1.0000 mg | INTRAMUSCULAR | Status: DC | PRN
  Administered 2022-02-27 – 2022-03-02 (×2): 2 mg via INTRAVENOUS
  Filled 2022-02-25 (×2): qty 2

## 2022-02-25 MED ORDER — ONDANSETRON HCL 4 MG/2ML IJ SOLN: 4.0000 mg | Freq: Four times a day (QID) | INTRAMUSCULAR | Status: DC | PRN

## 2022-02-25 MED ORDER — ACETYLCYSTEINE 20 % IN SOLN
4.0000 mL | Freq: Once | RESPIRATORY_TRACT | Status: AC
  Administered 2022-02-25: 4 mL via RESPIRATORY_TRACT

## 2022-02-25 MED ORDER — HEPARIN (PORCINE) 25000 UT/250ML-% IV SOLN: 1150.0000 [IU]/h | INTRAVENOUS | Status: DC

## 2022-02-25 MED ORDER — FAMOTIDINE 20 MG PO TABS: 40.0000 mg | ORAL_TABLET | Freq: Once | ORAL | Status: DC | PRN

## 2022-02-25 MED ORDER — NOREPINEPHRINE 4 MG/250ML-% IV SOLN
2.0000 ug/min | INTRAVENOUS | Status: DC
  Administered 2022-02-25: 5 ug/min via INTRAVENOUS
  Administered 2022-02-26 (×2): 2 ug/min via INTRAVENOUS
  Administered 2022-02-27: 3 ug/min via INTRAVENOUS
  Filled 2022-02-25: qty 250

## 2022-02-25 MED ORDER — NOREPINEPHRINE 4 MG/250ML-% IV SOLN
INTRAVENOUS | Status: AC
  Filled 2022-02-25: qty 250

## 2022-02-25 MED ORDER — TOLTERODINE TARTRATE ER 2 MG PO CP24
2.0000 mg | ORAL_CAPSULE | Freq: Two times a day (BID) | ORAL | Status: DC
  Filled 2022-02-25: qty 1

## 2022-02-25 MED ORDER — MIDAZOLAM HCL 2 MG/2ML IJ SOLN
INTRAMUSCULAR | Status: AC
  Filled 2022-02-25: qty 4

## 2022-02-25 MED ORDER — SODIUM CHLORIDE 0.9 % IV SOLN: INTRAVENOUS | Status: DC

## 2022-02-25 MED ORDER — CEFAZOLIN SODIUM-DEXTROSE 2-4 GM/100ML-% IV SOLN: 2.0000 g | INTRAVENOUS | Status: AC

## 2022-02-25 MED ORDER — DIPHENHYDRAMINE HCL 50 MG/ML IJ SOLN: 50.0000 mg | Freq: Once | INTRAMUSCULAR | Status: DC | PRN

## 2022-02-25 MED ORDER — HEPARIN BOLUS VIA INFUSION
1700.0000 [IU] | Freq: Once | INTRAVENOUS | Status: AC
  Administered 2022-02-25: 1700 [IU] via INTRAVENOUS
  Filled 2022-02-25: qty 1700

## 2022-02-25 MED ORDER — FENTANYL CITRATE (PF) 100 MCG/2ML IJ SOLN
INTRAMUSCULAR | Status: DC | PRN
  Administered 2022-02-25: 25 ug
  Administered 2022-02-25: 50 ug via INTRAVENOUS

## 2022-02-25 MED ORDER — METOPROLOL TARTRATE 5 MG/5ML IV SOLN
5.0000 mg | Freq: Once | INTRAVENOUS | Status: AC
  Administered 2022-02-25: 5 mg via INTRAVENOUS
  Filled 2022-02-25: qty 5

## 2022-02-25 MED ORDER — FENTANYL CITRATE (PF) 100 MCG/2ML IJ SOLN
INTRAMUSCULAR | Status: AC
  Filled 2022-02-25: qty 2

## 2022-02-25 MED ORDER — FENTANYL CITRATE (PF) 100 MCG/2ML IJ SOLN: 50.0000 ug | Freq: Once | INTRAMUSCULAR | Status: DC

## 2022-02-25 MED ORDER — HEPARIN SODIUM (PORCINE) 1000 UNIT/ML IJ SOLN
INTRAMUSCULAR | Status: AC
  Filled 2022-02-25: qty 10

## 2022-02-25 MED ORDER — SODIUM CHLORIDE 0.9 % IV SOLN
3.0000 g | Freq: Four times a day (QID) | INTRAVENOUS | Status: DC
  Administered 2022-02-25 – 2022-03-01 (×15): 3 g via INTRAVENOUS
  Filled 2022-02-25 (×8): qty 3
  Filled 2022-02-25: qty 8
  Filled 2022-02-25 (×5): qty 3
  Filled 2022-02-25: qty 8
  Filled 2022-02-25: qty 3

## 2022-02-25 MED ORDER — ETOMIDATE 2 MG/ML IV SOLN
INTRAVENOUS | Status: AC
  Administered 2022-02-25: 20 mg
  Filled 2022-02-25: qty 10

## 2022-02-25 MED ORDER — FENTANYL BOLUS VIA INFUSION
50.0000 ug | INTRAVENOUS | Status: DC | PRN
  Administered 2022-02-25: 100 ug via INTRAVENOUS
  Administered 2022-02-26: 50 ug via INTRAVENOUS
  Administered 2022-02-27: 100 ug via INTRAVENOUS

## 2022-02-25 MED ORDER — PROPOFOL 1000 MG/100ML IV EMUL
0.0000 ug/kg/min | INTRAVENOUS | Status: DC
  Administered 2022-02-25: 5 ug/kg/min via INTRAVENOUS
  Administered 2022-02-27: 30 ug/kg/min via INTRAVENOUS
  Administered 2022-02-27: 50 ug/kg/min via INTRAVENOUS
  Administered 2022-02-28 (×3): 25 ug/kg/min via INTRAVENOUS
  Administered 2022-03-01: 30 ug/kg/min via INTRAVENOUS
  Administered 2022-03-01: 25 ug/kg/min via INTRAVENOUS
  Administered 2022-03-01: 20 ug/kg/min via INTRAVENOUS
  Administered 2022-03-02: 25 ug/kg/min via INTRAVENOUS
  Administered 2022-03-02 (×2): 30 ug/kg/min via INTRAVENOUS
  Administered 2022-03-03: 40 ug/kg/min via INTRAVENOUS
  Filled 2022-02-25 (×13): qty 100

## 2022-02-25 MED ORDER — DOCUSATE SODIUM 50 MG/5ML PO LIQD
100.0000 mg | Freq: Two times a day (BID) | ORAL | Status: DC
  Administered 2022-02-27 – 2022-03-07 (×12): 100 mg
  Filled 2022-02-25 (×15): qty 10

## 2022-02-25 MED ORDER — HEPARIN SODIUM (PORCINE) 1000 UNIT/ML IJ SOLN
INTRAMUSCULAR | Status: DC | PRN
  Administered 2022-02-25: 3000 [IU] via INTRAVENOUS

## 2022-02-25 MED ORDER — MUPIROCIN 2 % EX OINT
TOPICAL_OINTMENT | Freq: Two times a day (BID) | CUTANEOUS | Status: AC
  Administered 2022-03-09 – 2022-03-10 (×2): 1 via TOPICAL
  Filled 2022-02-25: qty 22

## 2022-02-25 MED ORDER — CHLORHEXIDINE GLUCONATE CLOTH 2 % EX PADS
6.0000 | MEDICATED_PAD | Freq: Every day | CUTANEOUS | Status: DC
  Administered 2022-02-25 – 2022-03-20 (×22): 6 via TOPICAL

## 2022-02-25 MED ORDER — PANTOPRAZOLE SODIUM 40 MG IV SOLR
40.0000 mg | Freq: Every day | INTRAVENOUS | Status: DC
  Administered 2022-02-25 – 2022-03-04 (×8): 40 mg via INTRAVENOUS
  Filled 2022-02-25 (×8): qty 10

## 2022-02-25 MED ORDER — HYDROXYZINE HCL 25 MG PO TABS
25.0000 mg | ORAL_TABLET | Freq: Three times a day (TID) | ORAL | Status: DC | PRN
  Administered 2022-02-25: 25 mg via ORAL
  Filled 2022-02-25: qty 1

## 2022-02-25 MED ORDER — VECURONIUM BROMIDE 10 MG IV SOLR
INTRAVENOUS | Status: AC
  Administered 2022-02-25: 10 mg
  Filled 2022-02-25: qty 10

## 2022-02-25 MED ORDER — ROCURONIUM BROMIDE 10 MG/ML (PF) SYRINGE
PREFILLED_SYRINGE | INTRAVENOUS | Status: AC
  Filled 2022-02-25: qty 10

## 2022-02-25 MED ORDER — FAMOTIDINE 20 MG PO TABS
20.0000 mg | ORAL_TABLET | Freq: Two times a day (BID) | ORAL | Status: DC
  Administered 2022-02-27 – 2022-03-08 (×18): 20 mg
  Filled 2022-02-25 (×20): qty 1

## 2022-02-25 MED ORDER — PHENYLEPHRINE HCL-NACL 20-0.9 MG/250ML-% IV SOLN
25.0000 ug/min | INTRAVENOUS | Status: DC
  Administered 2022-02-25: 25 ug/min via INTRAVENOUS
  Filled 2022-02-25 (×2): qty 250

## 2022-02-25 MED ORDER — MIDAZOLAM HCL 2 MG/2ML IJ SOLN
2.0000 mg | INTRAMUSCULAR | Status: DC | PRN
  Administered 2022-02-25: 4 mg via INTRAVENOUS
  Filled 2022-02-25: qty 4

## 2022-02-25 MED ORDER — DALFAMPRIDINE ER 10 MG PO TB12
10.0000 mg | ORAL_TABLET | Freq: Two times a day (BID) | ORAL | Status: DC
  Administered 2022-03-06 – 2022-03-07 (×2): 10 mg via ORAL
  Filled 2022-02-25 (×7): qty 1

## 2022-02-25 MED ORDER — SODIUM CHLORIDE 0.9 % IV SOLN
250.0000 mL | INTRAVENOUS | Status: DC
  Administered 2022-03-08: 250 mL via INTRAVENOUS

## 2022-02-25 MED ORDER — POLYETHYLENE GLYCOL 3350 17 G PO PACK
17.0000 g | PACK | Freq: Every day | ORAL | Status: DC
  Administered 2022-02-27 – 2022-03-05 (×7): 17 g
  Filled 2022-02-25 (×7): qty 1

## 2022-02-25 MED ORDER — MIDAZOLAM HCL 2 MG/2ML IJ SOLN
INTRAMUSCULAR | Status: DC | PRN
  Administered 2022-02-25: 1 mg
  Administered 2022-02-25: 2 mg via INTRAVENOUS

## 2022-02-25 MED ORDER — POLYETHYLENE GLYCOL 3350 17 G PO PACK: 17.0000 g | PACK | Freq: Every day | ORAL | Status: DC

## 2022-02-25 MED ORDER — IODIXANOL 320 MG/ML IV SOLN
INTRAVENOUS | Status: DC | PRN
  Administered 2022-02-25: 40 mL

## 2022-02-25 MED ORDER — FENTANYL 2500MCG IN NS 250ML (10MCG/ML) PREMIX INFUSION
50.0000 ug/h | INTRAVENOUS | Status: DC
  Administered 2022-02-26: 200 ug/h via INTRAVENOUS
  Administered 2022-02-27: 175 ug/h via INTRAVENOUS
  Administered 2022-02-27: 100 ug/h via INTRAVENOUS
  Administered 2022-02-28: 150 ug/h via INTRAVENOUS
  Administered 2022-03-01 (×2): 100 ug/h via INTRAVENOUS
  Administered 2022-03-02: 125 ug/h via INTRAVENOUS
  Filled 2022-02-25 (×7): qty 250

## 2022-02-25 MED ORDER — HYDROMORPHONE HCL 1 MG/ML IJ SOLN: 1.0000 mg | Freq: Once | INTRAMUSCULAR | Status: DC | PRN

## 2022-02-25 MED ORDER — FENTANYL 2500MCG IN NS 250ML (10MCG/ML) PREMIX INFUSION
INTRAVENOUS | Status: AC
  Administered 2022-02-25: 50 ug/h via INTRAVENOUS
  Filled 2022-02-25: qty 250

## 2022-02-25 SURGICAL SUPPLY — 20 items
CANISTER PENUMBRA ENGINE (MISCELLANEOUS) IMPLANT
CANNULA 5F STIFF (CANNULA) IMPLANT
CATH ANGIO 5F PIGTAIL 100CM (CATHETERS) IMPLANT
CATH INDIGO 12XTORQ 100 (CATHETERS) IMPLANT
CATH INDIGO SEP 12 (CATHETERS) IMPLANT
CATH INFINITI JR4 5F (CATHETERS) IMPLANT
CATH SELECT BERN TIP 5F 130 (CATHETERS) IMPLANT
CLOSURE PERCLOSE PROSTYLE (VASCULAR PRODUCTS) IMPLANT
COVER DRAPE FLUORO 36X44 (DRAPES) IMPLANT
GLIDEWIRE ADV .035X180CM (WIRE) IMPLANT
PACK ANGIOGRAPHY (CUSTOM PROCEDURE TRAY) ×1 IMPLANT
SHEATH BRITE TIP 6FRX11 (SHEATH) IMPLANT
SHEATH PINNACLE 11FRX10 (SHEATH) IMPLANT
SUT MNCRL AB 4-0 PS2 18 (SUTURE) IMPLANT
SUT PROLENE 0 CT 1 30 (SUTURE) IMPLANT
SYR MEDRAD MARK 7 150ML (SYRINGE) IMPLANT
TUBING CONTRAST HIGH PRESS 72 (TUBING) IMPLANT
WIRE GUIDERIGHT .035X150 (WIRE) IMPLANT
WIRE NITINOL .018 (WIRE) IMPLANT
WIRE SUPRACORE 300CM (MISCELLANEOUS) IMPLANT

## 2022-02-25 NOTE — Consult Note (Signed)
ANTICOAGULATION CONSULT NOTE  Pharmacy Consult for heparin drip Indication: pulmonary embolus  Allergies  Allergen Reactions   Erythromycin Hives and Nausea And Vomiting   Lexapro [Escitalopram Oxalate] Hives    Patient Measurements: Weight: 68.4 kg (150 lb 12.7 oz) Heparin Dosing Weight: 57.2 kg  Vital Signs: Temp: 98.3 F (36.8 C) (11/15 0400) Temp Source: Oral (11/15 0400) BP: 92/54 (11/15 0630) Pulse Rate: 113 (11/15 0630)  Labs: Recent Labs    02/24/22 1541 02/24/22 1542 02/24/22 1854 02/25/22 0258  HGB 9.3*  --   --  8.0*  HCT 29.6*  --   --  25.2*  PLT 231  --   --  206  APTT  --   --  47*  --   LABPROT  --   --  15.6*  --   INR  --   --  1.3*  --   HEPARINUNFRC  --   --   --  0.15*  CREATININE 0.62  --   --  0.73  TROPONINIHS  --  9  --   --      Estimated Creatinine Clearance: 69.5 mL/min (by C-G formula based on SCr of 0.73 mg/dL).   Medical History: Past Medical History:  Diagnosis Date   GERD (gastroesophageal reflux disease)    Multiple sclerosis (HCC)     Medications:  No home anticoagulation per pharmacist review.  Assessment: 61 yo female presented to ED with SOB when lying flat and some right sided rib and shoulder pain.  Chest CT showed acute PE.  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  Goal of Therapy:  Heparin level 0.3-0.7 units/ml Monitor platelets by anticoagulation protocol: Yes    Plan: heparin level therapeutic --continue heparin infusion at 1150 units/hr --Recheck heparin level in 6 hours to confirm --CBC daily while on heparin  Burnis Medin, PharmD, BCPS 02/25/2022 7:22 AM

## 2022-02-25 NOTE — TOC Initial Note (Signed)
Transition of Care [facility]) - Initial/Assessment Note    Patient Details  Name: [name] MRN: [id number] Date of Birth: 1961/01/03  Transition of Care [facility]) CM/SW Contact:    Allayne Butcher, RN Phone Number: 02/25/2022, 11:15 AM  Clinical Narrative:                   Transition of Care (TOC) Screening Note   Patient Details  Name: [name] Date of Birth: Jun 11, 1960   Transition of Care [facility]) CM/SW Contact:    Allayne Butcher, RN Phone Number: 02/25/2022, 11:15 AM    Transition of Care Department [facility]) has reviewed patient and no TOC needs have been identified at this time. We will continue to monitor patient advancement through interdisciplinary progression rounds. If new patient transition needs arise, please place a TOC consult.         Patient Goals and CMS Choice        Expected Discharge Plan and Services                                                Prior Living Arrangements/Services                       Activities of Daily Living Home Assistive Devices/Equipment: Vent/Trach supplies ADL Screening (condition at time of admission) Patient's cognitive ability adequate to safely complete daily activities?: Yes Is the patient deaf or have difficulty hearing?: No Does the patient have difficulty seeing, even when wearing glasses/contacts?: No Does the patient have difficulty concentrating, remembering, or making decisions?: No Patient able to express need for assistance with ADLs?: Yes Does the patient have difficulty dressing or bathing?: Yes (can dress self but needs assistance getting in shower) Independently performs ADLs?: No Communication: Independent Dressing (OT): Independent Grooming: Independent Feeding: Independent Bathing: Needs assistance Is this a change from baseline?: Pre-admission baseline Toileting: Needs assistance Is this a change from baseline?: Pre-admission baseline In/Out Bed: Needs  assistance Is this a change from baseline?: Pre-admission baseline Walks in Home: Dependent Is this a change from baseline?: Pre-admission baseline Does the patient have difficulty walking or climbing stairs?: Yes Weakness of Legs: Both Weakness of Arms/Hands: Both (full movement but some weakness)  Permission Sought/Granted                  Emotional Assessment              Admission diagnosis:  Bilateral pulmonary embolism (HCC) [I26.99] Sepsis, due to unspecified organism, unspecified whether acute organ dysfunction present (HCC) [A41.9] Other acute pulmonary embolism, unspecified whether acute cor pulmonale present (HCC) [I26.99] Patient Active Problem List   Diagnosis Date Noted   Bilateral pulmonary embolism (HCC) 02/24/2022   Tachycardia 02/24/2022   Multiple sclerosis (HCC) 06/26/2021   Abdominal pain, LUQ (left upper quadrant) 06/10/2021   Chronic constipation 06/10/2021   Family history of pancreatic cancer 06/10/2021   Gastroesophageal reflux disease 06/10/2021   Low back pain 12/06/2020   Numbness 09/26/2020   Falls frequently 04/18/2020   Right leg pain 04/18/2020   RLS (restless legs syndrome) 04/18/2020   History of multiple sclerosis (HCC) 02/22/2020   PCP:  [facility] Pharmacy:   [id number] [place], [place] - [address] [place] [address] Phone: [phone] Fax: [phone]     Social  Determinants of Health (SDOH) Interventions    Readmission Risk Interventions     No data to display

## 2022-02-25 NOTE — Progress Notes (Signed)
Two gold rings removed from patient's fingers and given to sister Windell Moulding.

## 2022-02-25 NOTE — Progress Notes (Signed)
Patient arrived post pulmonary thrombectomy procedure requiring non rebreather suctioning brb orally significant size blood clot suctioned orally.  nasal trumpet inserted for airway support o2 sats in the 70's lung sounds wet rhonchi unresponsive, bp stable, tachycardic.  Dr. Wyn Quaker called to bedside, o2 via bag due to low sats on non breather at 1600 O2 sats continue hypoxic in the high 70's to mid 80's rapid response called 1603 and 1609 ed md at bedside intubating patient 1615 7.5 ett inserted using glideoscope 1619 transferred to ICU . Dr. Wyn Quaker to ICU to speak with patient's family. See rapid response/code blue note for further details.

## 2022-02-25 NOTE — Procedures (Signed)
Endotracheal Intubation: Patient required placement of an artificial airway secondary to Respiratory Failure  PATIENT WAS EXTUBATED and RE-INTUBATED WITH 8.0    Consent: Emergent.   Hand washing performed prior to starting the procedure.    A time out procedure was called and correct patient, name, & ID confirmed. Needed supplies and equipment were assembled and checked to include ETT, 10 ml syringe, Glidescope, Mac and Miller blades, suction, oxygen and bag mask valve, end tidal CO2 monitor.   Patient was positioned to align the mouth and pharynx to facilitate visualization of the glottis.   Heart rate, SpO2 and blood pressure was continuously monitored during the procedure. Pre-oxygenation was conducted prior to intubation and endotracheal tube was placed through the vocal cords into the trachea.     The artificial airway was placed under direct visualization via glidescope route using a 8.5 ETT on the first attempt.  ETT was secured at 21 cm mark.  Placement was confirmed by auscuitation of lungs with good breath sounds bilaterally and no stomach sounds.  Condensation was noted on endotracheal tube.   Pulse ox 100%.  CO2 detector in place with appropriate color change.   Complications: None .   Operator: Jarris Kortz.   Chest radiograph ordered and pending.     Corrin Parker, M.D.  [facility]  Medical Director [place] Director [facility] Cardio-Pulmonary Department

## 2022-02-25 NOTE — H&P (View-Only) (Signed)
[facility] Vascular Consult Note  MRN : [id number]  [name] is a 61 y.o. (1960-10-20) female who presents with chief complaint of  Chief Complaint  Patient presents with   Shortness of Breath  .   Consulting Physician: DR. Hosie Poisson MD Reason for consult: Shortness of Breath/ Bilateral Pulmonary Embolism  History of Present Illness:  [name] is a 61 year old female with a history of hypertension, depression, anxiety, restless leg syndrome, GERD, multiple sclerosis, who presents to the emergency department for chief concern of shortness of breath yesterday.  A CTA PE was done which shows moderate thrombus burden involving both lungs with an RV to LV ratio of 1.3 which suggests acute on chronic right heart dysfunction or strain.  She is also noted to have right pleural effusion with patchy infiltrates to both lung fields.  Bilateral lower extremity venous doppler ultrasounds reveal an occlusive thrombus in both peroneal veins.  Patient was sitting up in bed this morning in no distress.  She is alert and oriented x4, on 4 L of oxygen via nasal cannula with an SPO2 of 94-95%.  Patient states that the severe shortness of breath started 3 to 4 days ago with chest pain mostly to her right sided rib cage.  She also states that she started to feel weak about 3 to 4 weeks ago.  At that point in time she stated that she was diagnosed as being COVID-positive and did have shortness of breath.  She also states that she felt like she was having a racing heart/palpitations without any significant chest pain.  She denies any recent international travel, long car trips or cancer that she knows or surgery.  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  Patient states that she has been n.p.o. since yesterday.  Patient states that she has never taken any anticoagulation medicine in the past.  Patient denies any prior  stroke/TIA symptoms.  Current Facility-Administered Medications  Medication Dose Route Frequency Provider Last Rate Last Admin   acetaminophen (TYLENOL) tablet 650 mg  650 mg Oral Q6H PRN Cox, Amy N, DO   650 mg at 02/25/22 0741   Or   acetaminophen (TYLENOL) suppository 650 mg  650 mg Rectal Q6H PRN Cox, Amy N, DO   650 mg at 02/25/22 0024   Chlorhexidine Gluconate Cloth 2 % PADS 6 each  6 each Topical Daily Cox, Amy N, DO       cyanocobalamin (VITAMIN B12) tablet 1,000 mcg  1,000 mcg Oral Daily Cox, Amy N, DO   1,000 mcg at 02/25/22 [id number]   dalfampridine TB12 10 mg  10 mg Oral Daily Cox, Amy N, DO       DULoxetine (CYMBALTA) DR capsule 30 mg  30 mg Oral Daily Cox, Amy N, DO   30 mg at 02/25/22 0917   gabapentin (NEURONTIN) capsule 400 mg  400 mg Oral BID Renda Rolls, RPH   400 mg at 02/25/22 [id number]   And   gabapentin (NEURONTIN) capsule 800 mg  800 mg Oral QHS Renda Rolls, RPH   800 mg at 02/25/22 0023   heparin ADULT infusion 100 units/mL (25000 units/23mL)  1,150 Units/hr Intravenous Continuous Renda Rolls, RPH 11.5 mL/hr at 02/25/22 0800 1,150 Units/hr at 02/25/22 0800   lactated ringers infusion   Intravenous Continuous Cox, Amy N, DO 150 mL/hr at 02/25/22 0837 New Bag at 02/25/22 0837   mupirocin cream (BACTROBAN)  2 %   Topical BID Hosie Poisson, MD       ondansetron (ZOFRAN) tablet 4 mg  4 mg Oral Q6H PRN Cox, Amy N, DO       Or   ondansetron (ZOFRAN) injection 4 mg  4 mg Intravenous Q6H PRN Cox, Amy N, DO   4 mg at 02/25/22 0019   pantoprazole (PROTONIX) EC tablet 40 mg  40 mg Oral Daily Cox, Amy N, DO   40 mg at 02/25/22 0917   pregabalin (LYRICA) capsule 75 mg  75 mg Oral TID Cox, Amy N, DO   75 mg at 02/25/22 0916   propranolol (INDERAL) tablet 20 mg  20 mg Oral BID Cox, Amy N, DO   20 mg at 02/25/22 [id number]   rOPINIRole (REQUIP) tablet 0.5 mg  0.5 mg Oral QHS Cox, Amy N, DO   0.5 mg at 02/25/22 [id number]   senna-docusate (Senokot-S) tablet 1 tablet  1 tablet Oral QHS PRN Cox,  Amy N, DO       traMADol (ULTRAM) tablet 50 mg  50 mg Oral TID PRN Cox, Amy N, DO   50 mg at 02/25/22 0400    Past Medical History:  Diagnosis Date   GERD (gastroesophageal reflux disease)    Multiple sclerosis ([place])     Past Surgical History:  Procedure Laterality Date   ESOPHAGOGASTRODUODENOSCOPY (EGD) WITH PROPOFOL N/A 09/11/2021   Procedure: ESOPHAGOGASTRODUODENOSCOPY (EGD) WITH PROPOFOL;  Surgeon: Annamaria Helling, DO;  Location: [facility] ENDOSCOPY;  Service: Gastroenterology;  Laterality: N/A;   TUBAL LIGATION      Social History Social History   Tobacco Use   Smoking status: Never   Smokeless tobacco: Never  Vaping Use   Vaping Use: Never used  Substance Use Topics   Alcohol use: Yes    Comment: Social   Drug use: Never    Family History History reviewed. No pertinent family history.  Allergies  Allergen Reactions   Erythromycin Hives and Nausea And Vomiting   Lexapro [Escitalopram Oxalate] Hives     REVIEW OF SYSTEMS (Negative unless checked)  Constitutional: [] Weight loss  [] Fever  [] Chills Cardiac: [x] Chest pain   [] Chest pressure   [] Palpitations   [x] Shortness of breath when laying flat   [x] Shortness of breath at rest   [x] Shortness of breath with exertion. Vascular:  [] Pain in legs with walking   [x] Pain in legs at rest   [] Pain in legs when laying flat   [] Claudication   [] Pain in feet when walking  [] Pain in feet at rest  [] Pain in feet when laying flat   [] History of DVT   [] Phlebitis   [] Swelling in legs   [] Varicose veins   [] Non-healing ulcers Pulmonary:   [] Uses home oxygen   [] Productive cough   [] Hemoptysis   [] Wheeze  [] COPD   [] Asthma Neurologic:  [] Dizziness  [] Blackouts   [] Seizures   [] History of stroke   [] History of TIA  [] Aphasia   [] Temporary blindness   [] Dysphagia   [] Weakness or numbness in arms   [] Weakness or numbness in legs Musculoskeletal:  [] Arthritis   [] Joint swelling   [] Joint pain   [] Low back pain Hematologic:  [] Easy  bruising  [] Easy bleeding   [] Hypercoagulable state   [] Anemic  [] Hepatitis Gastrointestinal:  [] Blood in stool   [] Vomiting blood  [] Gastroesophageal reflux/heartburn   [] Difficulty swallowing. Genitourinary:  [] Chronic kidney disease   [] Difficult urination  [] Frequent urination  [] Burning with urination   [] Blood in urine Skin:  [] Rashes   []   Ulcers   []Wounds Psychological:  [x]History of anxiety   [] History of major depression.  Physical Examination  Vitals:   02/25/22 0600 02/25/22 0630 02/25/22 0700 02/25/22 0800  BP: (!) 88/58 (!) 92/54 92/60 (!) 91/58  Pulse: (!) 114 (!) 113 (!) 114 (!) 113  Resp: 17 17 16 17  Temp:    98.4 F (36.9 C)  TempSrc:    Oral  SpO2: 96% 96% 96% 96%  Weight:       Body mass index is 27.58 kg/m. Gen:  WD/WN, NAD Head: [place]/AT, No temporalis wasting. Prominent temp pulse not noted. Ear/Nose/Throat: Hearing grossly intact, nares w/o erythema or drainage, oropharynx w/o Erythema/Exudate Eyes: Sclera non-icteric, conjunctiva clear Neck: Trachea midline.  No JVD.  Pulmonary:  Good air movement, respirations not labored, equal bilaterally.  Cardiac: RRR, normal S1, S2. Vascular: Normal palpable pulses throughout. Vessel Right Left  Radial Palpable Palpable  Ulnar Palpable Palpable  Brachial Palpable Palpable  Carotid Palpable, without bruit Palpable, without bruit  Aorta Not palpable N/A  Femoral Palpable Palpable  Popliteal Palpable Palpable  PT Palpable Palpable  DP Palpable Palpable   Gastrointestinal: soft, non-tender/non-distended. No guarding/reflex.  Musculoskeletal: M/S 5/5 throughout.  Extremities without ischemic changes.  No deformity or atrophy. No edema. Neurologic: Sensation grossly intact in extremities.  Symmetrical.  Speech is fluent. Motor exam as listed above. Lower leg spasticity from HX of Multiple Sclerosis. Psychiatric: Judgment intact, Mood & affect appropriate for pt's clinical situation. Dermatologic: No rashes or ulcers  noted.  No cellulitis or open wounds. Lymph : No Cervical, Axillary, or Inguinal lymphadenopathy.    CBC Lab Results  Component Value Date   WBC 17.4 (H) 02/25/2022   HGB 8.0 (L) 02/25/2022   HCT 25.2 (L) 02/25/2022   MCV 94.4 02/25/2022   PLT 206 02/25/2022    BMET    Component Value Date/Time   NA 139 02/25/2022 0258   K 3.6 02/25/2022 0258   CL 109 02/25/2022 0258   CO2 23 02/25/2022 0258   GLUCOSE 119 (H) 02/25/2022 0258   BUN 12 02/25/2022 0258   CREATININE 0.73 02/25/2022 0258   CALCIUM 8.1 (L) 02/25/2022 0258   GFRNONAA >60 02/25/2022 0258   Estimated Creatinine Clearance: 69.5 mL/min (by C-G formula based on SCr of 0.73 mg/dL).  COAG Lab Results  Component Value Date   INR 1.3 (H) 02/24/2022    Radiology ECHOCARDIOGRAM COMPLETE  Result Date: 02/24/2022    ECHOCARDIOGRAM REPORT   Patient Name:   [name] Date of Exam: 02/24/2022 Medical Rec #:  [id number]             Height:       62.0 in Accession #:    [id number]            Weight:       126.1 lb Date of Birth:  08/22/1960             BSA:          1.571 m Patient Age:    60 years              BP:           155/65 mmHg Patient Gender: F                     HR:           150 bpm. Exam Location:  [facility] Procedure: 2D Echo, Cardiac Doppler and Color Doppler Indications:       I26.09 Pulmonary Embolus  History:         Patient has no prior history of Echocardiogram examinations.                  Multiple Sclerosis.  Sonographer:     Cresenciano Lick RDCS Referring Phys:  [id number] AMY N COX Diagnosing Phys: Yolonda Kida MD IMPRESSIONS  1. Left ventricular ejection fraction, by estimation, is 60 to 65%. The left ventricle has normal function. The left ventricle has no regional wall motion abnormalities. Left ventricular diastolic parameters were normal.  2. Right ventricular systolic function is normal. The right ventricular size is normal.  3. The mitral valve is normal in structure. Trivial mitral valve  regurgitation.  4. The aortic valve is normal in structure. Aortic valve regurgitation is not visualized. FINDINGS  Left Ventricle: Left ventricular ejection fraction, by estimation, is 60 to 65%. The left ventricle has normal function. The left ventricle has no regional wall motion abnormalities. The left ventricular internal cavity size was normal in size. There is  no left ventricular hypertrophy. Left ventricular diastolic parameters were normal. Right Ventricle: The right ventricular size is normal. No increase in right ventricular wall thickness. Right ventricular systolic function is normal. Left Atrium: Left atrial size was normal in size. Right Atrium: Right atrial size was normal in size. Pericardium: There is no evidence of pericardial effusion. Mitral Valve: The mitral valve is normal in structure. Trivial mitral valve regurgitation. Tricuspid Valve: The tricuspid valve is normal in structure. Tricuspid valve regurgitation is not demonstrated. Aortic Valve: The aortic valve is normal in structure. Aortic valve regurgitation is not visualized. Pulmonic Valve: The pulmonic valve was normal in structure. Pulmonic valve regurgitation is not visualized. Aorta: The ascending aorta was not well visualized. IAS/Shunts: No atrial level shunt detected by color flow Doppler.  LEFT VENTRICLE PLAX 2D LVIDd:         3.70 cm LVIDs:         2.50 cm LV PW:         0.80 cm LV IVS:        0.80 cm LVOT diam:     1.70 cm LVOT Area:     2.27 cm  RIGHT VENTRICLE RV Basal diam:  3.70 cm RV S prime:     34.60 cm/s TAPSE (M-mode): 2.3 cm LEFT ATRIUM             Index        RIGHT ATRIUM          Index LA diam:        3.60 cm 2.29 cm/m   RA Area:     9.98 cm LA Vol (A2C):   19.7 ml 12.54 ml/m  RA Volume:   22.70 ml 14.45 ml/m LA Vol (A4C):   22.9 ml 14.57 ml/m LA Biplane Vol: 22.8 ml 14.51 ml/m   AORTA Ao Root diam: 3.00 cm Ao Asc diam:  3.10 cm TRICUSPID VALVE TR Peak grad:   49.3 mmHg TR Vmax:        351.00 cm/s  SHUNTS  Systemic Diam: 1.70 cm Yolonda Kida MD Electronically signed by Yolonda Kida MD Signature Date/Time: 02/24/2022/11:42:30 PM    Final    US Venous Img Lower Bilateral (DVT)  Result Date: 02/24/2022 CLINICAL DATA:  History of pulmonary embolus. EXAM: BILATERAL LOWER EXTREMITY VENOUS DOPPLER ULTRASOUND TECHNIQUE: Gray-scale sonography with graded compression, as well as color Doppler and duplex ultrasound were performed to evaluate the lower  extremity deep venous systems from the level of the common femoral vein and including the common femoral, femoral, profunda femoral, popliteal and calf veins including the posterior tibial, peroneal and gastrocnemius veins when visible. The superficial great saphenous vein was also interrogated. Spectral Doppler was utilized to evaluate flow at rest and with distal augmentation maneuvers in the common femoral, femoral and popliteal veins. COMPARISON:  CT scan of same day. FINDINGS: RIGHT LOWER EXTREMITY Common Femoral Vein: No evidence of thrombus. Normal compressibility, respiratory phasicity and response to augmentation. Saphenofemoral Junction: No evidence of thrombus. Normal compressibility and flow on color Doppler imaging. Profunda Femoral Vein: No evidence of thrombus. Normal compressibility and flow on color Doppler imaging. Femoral Vein: No evidence of thrombus. Normal compressibility, respiratory phasicity and response to augmentation. Popliteal Vein: No evidence of thrombus. Normal compressibility, respiratory phasicity and response to augmentation. Calf Veins: There appears to be occlusive thrombus in the peroneal vein. Venous Reflux:  None. Other Findings:  None. LEFT LOWER EXTREMITY Common Femoral Vein: No evidence of thrombus. Normal compressibility, respiratory phasicity and response to augmentation. Saphenofemoral Junction: No evidence of thrombus. Normal compressibility and flow on color Doppler imaging. Profunda Femoral Vein: No evidence of  thrombus. Normal compressibility and flow on color Doppler imaging. Femoral Vein: No evidence of thrombus. Normal compressibility, respiratory phasicity and response to augmentation. Popliteal Vein: No evidence of thrombus. Normal compressibility, respiratory phasicity and response to augmentation. Calf Veins: There appears to be occlusive thrombus in the peroneal vein. Superficial Great Saphenous Vein: No evidence of thrombus. Normal compressibility. Venous Reflux:  None. Other Findings:  None. IMPRESSION: Occlusive thrombus is noted in the peroneal veins bilaterally. Electronically Signed   By: Lupita Raider M.D.   On: 02/24/2022 18:54   CT Angio Chest PE W and/or Wo Contrast  Result Date: 02/24/2022 CLINICAL DATA:  Shortness of breath, right chest pain EXAM: CT ANGIOGRAPHY CHEST WITH CONTRAST TECHNIQUE: Multidetector CT imaging of the chest was performed using the standard protocol during bolus administration of intravenous contrast. Multiplanar CT image reconstructions and MIPs were obtained to evaluate the vascular anatomy. RADIATION DOSE REDUCTION: This exam was performed according to the departmental dose-optimization program which includes automated exposure control, adjustment of the mA and/or kV according to patient size and/or use of iterative reconstruction technique. CONTRAST:  106mL OMNIPAQUE IOHEXOL 350 MG/ML SOLN COMPARISON:  Chest radiograph done earlier today FINDINGS: Cardiovascular: Heart is enlarged in size. RV LV ratio is 1.3. This may be recent or chronic. There is homogeneous enhancement in thoracic aorta. There are filling defects in segmental and subsegmental branches in right upper lobe, right middle lobe, right lower lobe, left upper lobe and left lower lobe. There is moderate thrombus burden. Mediastinum/Nodes: No significant lymphadenopathy seen. Lungs/Pleura: There are patchy infiltrates in parahilar regions and lower lung fields, more so in right lower lobe. There is small right  pleural effusion. There is no pneumothorax. Upper Abdomen: There is moderate to large sized fixed hiatal hernia. Musculoskeletal: No acute findings are seen. Review of the MIP images confirms the above findings. IMPRESSION: Acute PE with moderate thrombus burden involving both lungs. RV-LV ratio is 1.3 suggesting acute or chronic right heart dysfunction. Small right pleural effusion. There are patchy infiltrates in parahilar regions and both lower lung fields, more so in right lower lobe suggesting atelectasis/pneumonia. Part of this finding could be due to developing pulmonary infarcts. Moderate to large sized fixed hiatal hernia. Imaging findings were relayed to patient's provider Dr. Artis Delay by telephone call.  Electronically Signed   By: Elmer Picker M.D.   On: 02/24/2022 17:13   DG Chest 2 View  Result Date: 02/24/2022 CLINICAL DATA:  Short of breath EXAM: CHEST - 2 VIEW COMPARISON:  None Available. FINDINGS: Heart size and vascularity normal. Negative for heart failure. Mild bibasilar airspace disease likely atelectasis. Small right pleural effusion. IMPRESSION: Mild bibasilar atelectasis.  Small right pleural effusion Electronically Signed   By: Franchot Gallo M.D.   On: 02/24/2022 16:05      Assessment/Plan 1.  Bilateral pulmonary embolisms.  Plan is to take the patient to the vascular lab today for pulmonary thrombectomy.  I spoke in great detail with both her and her husband about the procedure today.  Discussed in detail risks and complications related to the procedure.  Patient and husband were told there is a 3 to 5% chance of heart attack stroke or death related to procedure.  They are also instructed that there was a possibility of hematoma seroma at the insertion site of the catheter is for this pulmonary thrombectomy.  Both verbalize there understanding and wished to proceed with the procedure.  All of their questions were answered this morning.  We will get this case added to the  schedule.  2.  Bilateral peroneal lower extremity thrombus.  There is no intervention for this at this time.  Anticoagulation is recommended for this.  Patient is currently on heparin drip which will assist with this treatment.  Primary team will continue to monitor heparin.  3.  Multiple Sclerosis.  Recommend continuing all patient's home medications for her multiple sclerosis.  This will be managed by the primary team.  Plan of care discussed with Dr. Leotis Pain and he is in agreement with plan noted above.   Family Communication:  Total Time:75 I spent 75 minutes in this encounter including personally reviewing extensive medical records, personally reviewing imaging studies and compared to prior scans, counseling the patient, placing orders, coordinating care and performing appropriate documentation  Thank you for allowing [place] to participate in the care of this patient.   Drema Pry, NP [facility] [phone] (Office Phone) [phone] (Office Fax) [phone] (Pager)  02/25/2022 10:00 AM  Staff may message me via secure chat in [place]  but this may not receive immediate response,  please page for urgent matters!  Dictation software was used to generate the above note. Typos may occur and escape review, as with typed/written notes. Any error is purely unintentional.  Please contact me directly for clarity if needed.

## 2022-02-25 NOTE — Progress Notes (Signed)
  Chaplain On-Call responded to Rapid Response notification at 1605 hours to [facility]. The patient was in respiratory distress following surgery related to bilateral pulmonary embolism, and was unresponsive. The medical team intubated the patient and returned her to the Intensive Care Unit, bed-17.  Chaplain was called at 1650 hours by Unit Secretary Cyril Mourning, who reported patient's spouse was in the Unit and could use support. Chaplain met Thomes Dinning outside room 17. Chaplain offered compassionate listening as Quillian Quince described the patient's health challenges in living with MS, and the breathing problems which led to today's procedure.  Chaplain provided spiritual and emotional support for Quillian Quince, and assured him of further availability.  Chaplain Pollyann Samples M.Div., [facility]

## 2022-02-25 NOTE — Significant Event (Signed)
Responded to rapid response in [facility]. On arrival, pt on cardiac monitor, SPO2, ST, palpable pulse. Airway being maintained by jaw thrust, ventilated with bvm for chest rise, with high flow O2 connected to bvm. Noted bloody frothy secretions while being bagged. Dr. Wyn Quaker at bedside, Respiratory Therapy at bedside, EDP Dr. Carolin Coy EDP arrived.Pt gently preoxygenated. Pt premedicated with 20mg  etomidate, 100 mg of sux. Mouth open, pt intubated using glidescope,stated cords were visualized, 7.5 ETT passed to 23 at lip.EDP had color change on ETCO2, stated he heard good breath sounds, rise and fall of chest noted. O2 sats increased with bvm ventilation. Tube secured. CXR taken. No deterioration in status during or post intubation. Report called to ICU RN by cath lab staff. Dr. notified by secure chat pt had been intubated, and he acknowledged. Also secure chatted Santiago Glad NP.

## 2022-02-25 NOTE — Interval H&P Note (Signed)
History and Physical Interval Note:  02/25/2022 2:12 PM  [name]  has presented today for surgery, with the diagnosis of Bilateral Pulmonary embolism.  The various methods of treatment have been discussed with the patient and family. After consideration of risks, benefits and other options for treatment, the patient has consented to  Procedure(s): PULMONARY THROMBECTOMY (Bilateral) as a surgical intervention.  The patient's history has been reviewed, patient examined, no change in status, stable for surgery.  I have reviewed the patient's chart and labs.  Questions were answered to the patient's satisfaction.     Festus Barren

## 2022-02-25 NOTE — Progress Notes (Signed)
Rapid Response Event Note   Reason for Call : unresponsive, requiring intubation   Initial Focused Assessment: On my arrival pt is unresponsive and being bagged by RT. ED doc at bedside to intubate. VSS at this time. Dr. Wyn Quaker also at bedside. Pt is post thrombectomy.   Interventions: Intubation meds given and pt intubated. Pt place on ICU monitor at this time and transported back to ICU 17.    Plan of Care: Pt back to ICU 17 and Dr. Belia Heman taking over pt now intubated. Pt started on fentanyl gtt and levo gtt. RT at bedside placing pt on vent. Plan to do emergent bronch.    Event Summary:   MD Notified: Dr. Belia Heman Call Time: 1605 Arrival Time: 1608 End Time: Pt transferred back to ICU 17  Henrene Dodge, RN

## 2022-02-25 NOTE — ED Provider Notes (Signed)
[facility] Department of Emergency Medicine   Rapid response CONSULT NOTE  Chief Complaint: Respiratory failure  Level V Caveat: Unresponsive  History of present illness: I was contacted by the hospital for a rapid response in Special Procedures and presented to the patient's bedside.  She had undergone a vascular procedure due to PE.  Subsequently she had developed respiratory distress and hypoxia with O2 saturations in the 70s, bilateral rhonchi, and unresponsive.  ROS: Unable to obtain, Level V caveat  Scheduled Meds:  [MAR Hold] Chlorhexidine Gluconate Cloth  6 each Topical Daily   [MAR Hold] cyanocobalamin  1,000 mcg Oral Daily   [MAR Hold] dalfampridine  10 mg Oral BID   docusate  100 mg Per Tube BID   [MAR Hold] DULoxetine  30 mg Oral Daily   etomidate       fentaNYL (SUBLIMAZE) injection  50 mcg Intravenous Once   [MAR Hold] gabapentin  400 mg Oral BID   And   [MAR Hold] gabapentin  800 mg Oral QHS   [MAR Hold] mupirocin cream   Topical BID   [MAR Hold] pantoprazole  40 mg Oral Daily   polyethylene glycol  17 g Per Tube Daily   [MAR Hold] pregabalin  75 mg Oral TID   [MAR Hold] propranolol  20 mg Oral BID   [MAR Hold] rOPINIRole  0.5 mg Oral QHS   succinylcholine       [MAR Hold] tolterodine  2 mg Oral BID   Continuous Infusions:  sodium chloride 75 mL/hr at 02/25/22 1416   ceFAZolin     fentaNYL infusion INTRAVENOUS     heparin Stopped (02/25/22 1418)   PRN Meds:.[MAR Hold] acetaminophen **OR** [MAR Hold] acetaminophen, ceFAZolin, diphenhydrAMINE, etomidate, famotidine, fentaNYL, fentaNYL, heparin sodium (porcine), [MAR Hold]  HYDROmorphone (DILAUDID) injection, [MAR Hold] hydrOXYzine, iodixanol, [MAR Hold] LORazepam, methylPREDNISolone (SOLU-MEDROL) injection, midazolam, midazolam, midazolam, [MAR Hold] ondansetron **OR** [MAR Hold] ondansetron (ZOFRAN) IV, [MAR Hold] senna-docusate, succinylcholine, [MAR Hold] traMADol Past Medical History:   Diagnosis Date   GERD (gastroesophageal reflux disease)    Multiple sclerosis (HCC)    Past Surgical History:  Procedure Laterality Date   ESOPHAGOGASTRODUODENOSCOPY (EGD) WITH PROPOFOL N/A 09/11/2021   Procedure: ESOPHAGOGASTRODUODENOSCOPY (EGD) WITH PROPOFOL;  Surgeon: Jaynie Collins, DO;  Location: [facility] ENDOSCOPY;  Service: Gastroenterology;  Laterality: N/A;   TUBAL LIGATION     Social History   Socioeconomic History   Marital status: Widowed    Spouse name: Not on file   Number of children: Not on file   Years of education: Not on file   Highest education level: Not on file  Occupational History   Not on file  Tobacco Use   Smoking status: Never   Smokeless tobacco: Never  Vaping Use   Vaping Use: Never used  Substance and Sexual Activity   Alcohol use: Yes    Comment: Social   Drug use: Never   Sexual activity: Not Currently  Other Topics Concern   Not on file  Social History Narrative   Not on file   Social Determinants of Health   Financial Resource Strain: Not on file  Food Insecurity: Not on file  Transportation Needs: Not on file  Physical Activity: Not on file  Stress: Not on file  Social Connections: Not on file  Intimate Partner Violence: Not on file   Allergies  Allergen Reactions   Erythromycin Hives and Nausea And Vomiting   Lexapro [Escitalopram Oxalate] Hives    Last set of  Vital Signs (not current) Vitals:   02/25/22 1531 02/25/22 1536  BP: 120/82 112/64  Pulse: (!) 114 (!) 115  Resp:    Temp:    SpO2: 99% 100%     Physical Exam  Gen: unresponsive Cardiovascular: Good peripheral perfusion, normotensive Resp: Breath sounds equal bilaterally with bagging  Abd: nondistended  Neuro: Unresponsive HEENT: Blood in posterior pharynx Neck: No crepitus  Musculoskeletal: No deformity  Skin: warm  Procedures (when applicable, including Critical Care time): Procedure Name: Intubation Date/Time: 02/25/2022 4:28 PM  Performed  by: Dionne Bucy, MDPre-anesthesia Checklist: Patient identified, Emergency Drugs available, Suction available and Patient being monitored Oxygen Delivery Method: Ambu bag Preoxygenation: Pre-oxygenation with 100% oxygen Induction Type: IV induction and Rapid sequence Ventilation: Mask ventilation without difficulty and Nasal airway inserted- appropriate to patient size Laryngoscope Size: Glidescope and 3 Grade View: Grade I Number of attempts: 1 Placement Confirmation: ETT inserted through vocal cords under direct vision, CO2 detector and Breath sounds checked- equal and bilateral Secured at: 23 cm Tube secured with: Tape Dental Injury: Teeth and Oropharynx as per pre-operative assessment      MDM / Assessment and Plan Upon my arrival, the patient was in respiratory failure and unresponsive, requiring ventilation by BVM.  O2 saturation was in the high 80s on BVM.  Based on my assessment the patient required emergent intubation.  The patient was given etomidate and succinylcholine.  On visualization of the airway the posterior oropharynx had significant blood which I suctioned successfully.  I intubated the patient successfully on the first attempt using the Glidescope Go portable video endoscope.  Tube placement was confirmed via direct visualization, CO2 detector, and auscultation bilaterally.  Chest x-ray was then obtained showing the tube in a good position above the carina.  The patient became hypotensive after the intubation and fluids were started.  She was then immediately taken up to the ICU.   Dionne Bucy, MD 02/25/22 [id number]

## 2022-02-25 NOTE — Consult Note (Signed)
ANTICOAGULATION CONSULT NOTE - Initial Consult  Pharmacy Consult for heparin drip Indication: pulmonary embolus  Allergies  Allergen Reactions   Erythromycin Hives and Nausea And Vomiting   Lexapro [Escitalopram Oxalate] Hives    Patient Measurements: Weight: 68.4 kg (150 lb 12.7 oz) Heparin Dosing Weight: 57.2 kg  Vital Signs: Temp: 98.3 F (36.8 C) (11/15 0400) Temp Source: Oral (11/15 0205) BP: 94/54 (11/15 0300) Pulse Rate: 118 (11/15 0300)  Labs: Recent Labs    02/24/22 1541 02/24/22 1542 02/24/22 1854 02/25/22 0258  HGB 9.3*  --   --  8.0*  HCT 29.6*  --   --  25.2*  PLT 231  --   --  206  APTT  --   --  47*  --   LABPROT  --   --  15.6*  --   INR  --   --  1.3*  --   HEPARINUNFRC  --   --   --  0.15*  CREATININE 0.62  --   --  0.73  TROPONINIHS  --  9  --   --      Estimated Creatinine Clearance: 69.5 mL/min (by C-G formula based on SCr of 0.73 mg/dL).  Baseline PT/INR and aPTT are pending.   Medical History: Past Medical History:  Diagnosis Date   GERD (gastroesophageal reflux disease)    Multiple sclerosis (HCC)     Medications:  No home anticoagulation per pharmacist review.  Assessment: 61 yo female presented to ED with SOB when lying flat and some right sided rib and shoulder pain.  Chest CT showed acute PE.  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  Goal of Therapy:  Heparin level 0.3-0.7 units/ml Monitor platelets by anticoagulation protocol: Yes   11/15 0258 HL 0.15, subtherapeutic  Plan:  Give 1700 unit bolus x 1 Increase heparin infusion to 1150 units/hr Recheck HL in 6 hr after rate change CBC daily while on heparin  Otelia Sergeant, PharmD, [facility] 02/25/2022 4:11 AM

## 2022-02-25 NOTE — Progress Notes (Signed)
Patient's cell phone given to significant other to take home.

## 2022-02-25 NOTE — Consult Note (Signed)
NAME:  [name], MRN:  [id number], DOB:  1960-08-19, LOS: 1 ADMISSION DATE:  02/24/2022 CHIEF COMPLAINT:  severe B/L Pulmonary Embolisms POA    History of Present Illness:  61 year old female with history of hypertension, depression, anxiety, restless leg syndrome, GERD, who presents emergency department for chief concerns of shortness of breath    CTA PE: + acute PE with moderate thrombus burden involving both lungs.  RV-LV ratio is 1.3 suggesting acute or chronic right heart dysfunction.    ED treatment: Ceftriaxone 2 g IV, vancomycin, LR 150 mL bolus, doxycycline.  VASC SURGERY CONSULTED FOR THROMBECTOMY MASSIVE AMOUNTS OF CLOT REMOVED POST PROCEDURE-PATIENT DEVELOPED ACUTE HEMOPTYSIS AND WAS INTUBATED FOR SEVERE RESP DISTRESS AND SEVERE HYPOXIA  PCCM ASKED TO EVALUATE TRANSFER TO ICU SERVICE   Pertinent  Medical History   Active Ambulatory Problems    Diagnosis Date Noted   Multiple sclerosis (HCC) 06/26/2021   Abdominal pain, LUQ (left upper quadrant) 06/10/2021   Chronic constipation 06/10/2021   Falls frequently 04/18/2020   Family history of pancreatic cancer 06/10/2021   Gastroesophageal reflux disease 06/10/2021   History of multiple sclerosis (HCC) 02/22/2020   Low back pain 12/06/2020   Numbness 09/26/2020   Right leg pain 04/18/2020   RLS (restless legs syndrome) 04/18/2020   Resolved Ambulatory Problems    Diagnosis Date Noted   No Resolved Ambulatory Problems   Past Medical History:  Diagnosis Date   GERD (gastroesophageal reflux disease)      Significant Hospital Events: Including procedures, antibiotic start and stop dates in addition to other pertinent events   11/14 admitted for SOB found B/L PE 11/15 s/p thrombectomy with hemoptysis leading to resp distress 11/15 PCCM asked to transfer to ICU s/p intubation        Objective   Blood pressure 112/64, pulse (!) 115, temperature 98.1 F (36.7 C), temperature source Oral, resp.  rate 20, weight 68.4 kg, SpO2 100 %.        Intake/Output Summary (Last 24 hours) at 02/25/2022 1620 Last data filed at 02/25/2022 1400 Gross per 24 hour  Intake 3243.33 ml  Output 500 ml  Net 2743.33 ml   Filed Weights   02/24/22 1710 02/25/22 0400  Weight: 57.2 kg 68.4 kg     REVIEW OF SYSTEMS  PATIENT IS UNABLE TO PROVIDE COMPLETE REVIEW OF SYSTEMS DUE TO SEVERE CRITICAL ILLNESS   PHYSICAL EXAMINATION:  GENERAL:critically ill appearing, +resp distress EYES: Pupils equal, round, reactive to light.  No scleral icterus.  MOUTH: Moist mucosal membrane. INTUBATED NECK: Supple.  PULMONARY: Lungs clear to auscultation, +rhonchi, +wheezing CARDIOVASCULAR: S1 and S2.  Regular rate and rhythm GASTROINTESTINAL: Soft, nontender, -distended. Positive bowel sounds.  MUSCULOSKELETAL: No swelling, clubbing, or edema.  NEUROLOGIC: obtunded,sedated SKIN:normal, warm to touch, Capillary refill delayed  Pulses present bilaterally   Labs/imaging that I havepersonally reviewed  (right click and "Reselect all SmartList Selections" daily)      ASSESSMENT AND PLAN SYNOPSIS  61 yo female with massive PE s/p Thrombectomy complicated by severe hemoptysis leading to severe resp distress leading to acute encephalopathy and needing emergent intubation  Severe ACUTE Hypoxic and Hypercapnic Respiratory Failure -continue Mechanical Ventilator support -continue Bronchodilator Therapy -Wean Fio2 and PEEP as tolerated -VAP/VENT bundle implementation -will NOT  perform SAT/SBT Consider BRONCH FOR AIRWAY CLEARANCE     CARDIAC ICU monitoring   RENAL -continue Foley Catheter-assess need -Avoid nephrotoxic agents -Follow urine output, BMP -Ensure adequate renal perfusion, optimize oxygenation -Renal dose medications   Intake/Output  Summary (Last 24 hours) at 02/25/2022 1620 Last data filed at 02/25/2022 1400 Gross per 24 hour  Intake 3243.33 ml  Output 500 ml  Net 2743.33 ml      NEUROLOGY Acute  metabolic encephalopathy, need for sedation Goal RASS -2 to -3   ENDO - ICU hypoglycemic\Hyperglycemia protocol -check FSBS per protocol   GI GI PROPHYLAXIS as indicated  NUTRITIONAL STATUS DIET-->NPO Constipation protocol as indicated   ELECTROLYTES -follow labs as needed -replace as needed -pharmacy consultation and following   ACUTE ANEMIA- TRANSFUSE AS NEEDED CONSIDER TRANSFUSION  IF HGB<7 DVT PRX with TED/SCD's ONLY     Best practice (right click and "Reselect all SmartList Selections" daily)  Diet: NPO Pain/Anxiety/Delirium protocol (if indicated): Yes (RASS goal -2) VAP protocol (if indicated): Yes DVT prophylaxis: Contraindicated GI prophylaxis: PPI Mobility:  bed rest  Code Status:  FULL Disposition:ICU  Labs   CBC: Recent Labs  Lab 02/24/22 1541 02/25/22 0258  WBC 15.7* 17.4*  HGB 9.3* 8.0*  HCT 29.6* 25.2*  MCV 91.6 94.4  PLT 231 206    Basic Metabolic Panel: Recent Labs  Lab 02/24/22 1541 02/25/22 0258  NA 138 139  K 3.7 3.6  CL 106 109  CO2 23 23  GLUCOSE 106* 119*  BUN 14 12  CREATININE 0.62 0.73  CALCIUM 9.2 8.1*   GFR: Estimated Creatinine Clearance: 69.5 mL/min (by C-G formula based on SCr of 0.73 mg/dL). Recent Labs  Lab 02/24/22 1541 02/24/22 1854 02/24/22 2056 02/25/22 0258  PROCALCITON  --  0.23  --   --   WBC 15.7*  --   --  17.4*  LATICACIDVEN  --  1.9 1.0  --     Liver Function Tests: No results for input(s): "AST", "ALT", "ALKPHOS", "BILITOT", "PROT", "ALBUMIN" in the last 168 hours. No results for input(s): "LIPASE", "AMYLASE" in the last 168 hours. No results for input(s): "AMMONIA" in the last 168 hours.  ABG No results found for: "PHART", "PCO2ART", "PO2ART", "HCO3", "TCO2", "ACIDBASEDEF", "O2SAT"   Coagulation Profile: Recent Labs  Lab 02/24/22 1854  INR 1.3*    Cardiac Enzymes: No results for input(s): "CKTOTAL", "CKMB", "CKMBINDEX", "TROPONINI" in the last 168  hours.  HbA1C: No results found for: "HGBA1C"  CBG: Recent Labs  Lab 02/25/22 0341  GLUCAP 124*     Past Medical History:  She,  has a past medical history of GERD (gastroesophageal reflux disease) and Multiple sclerosis (HCC).   Surgical History:   Past Surgical History:  Procedure Laterality Date   ESOPHAGOGASTRODUODENOSCOPY (EGD) WITH PROPOFOL N/A 09/11/2021   Procedure: ESOPHAGOGASTRODUODENOSCOPY (EGD) WITH PROPOFOL;  Surgeon: Jaynie Collins, DO;  Location: [facility] ENDOSCOPY;  Service: Gastroenterology;  Laterality: N/A;   TUBAL LIGATION       Social History:   reports that she has never smoked. She has never used smokeless tobacco. She reports current alcohol use. She reports that she does not use drugs.   Family History:  Her family history is not on file.   Allergies Allergies  Allergen Reactions   Erythromycin Hives and Nausea And Vomiting   Lexapro [Escitalopram Oxalate] Hives     Home Medications  Prior to Admission medications   Medication Sig Start Date End Date Taking? Authorizing Provider  cyanocobalamin 1000 MCG tablet Take by mouth.   Yes [provider]  DULoxetine (CYMBALTA) 30 MG capsule Take 30 mg by mouth daily. 06/25/21  Yes [provider]  gabapentin (NEURONTIN) 400 MG capsule Take Gabapentin 400 mg in  the morning, 400 mg in the afternoon, and 800 mg at night 12/29/21  Yes [provider]  omeprazole (PRILOSEC) 20 MG capsule Take 20 mg by mouth daily. 06/10/21  Yes [provider]  propranolol (INDERAL) 20 MG tablet Take 1 tablet by mouth 2 (two) times daily. 12/16/21  Yes [provider]  rOPINIRole (REQUIP) 0.25 MG tablet Take 0.25 mg at night for 1 week, then increase to 0.5 mg at night 05/29/21  Yes [provider]  tolterodine (DETROL LA) 2 MG 24 hr capsule Take 2 mg by mouth 2 (two) times daily. 12/29/21 12/29/22 Yes [provider]  ascorbic acid (VITAMIN C) 500 MG tablet Take by  mouth.    [provider]  Cholecalciferol 50 MCG (2000 UT) CAPS Take by mouth.    [provider]  cyclobenzaprine (FLEXERIL) 10 MG tablet Take 10 mg by mouth 2 (two) times daily as needed. 11/26/21   [provider]  dalfampridine 10 MG TB12 Take 1 tablet by mouth every 12 (twelve) hours. 12/29/21   [provider]  meloxicam (MOBIC) 15 MG tablet Take 1 tablet (15 mg total) by mouth daily. 08/23/21 08/23/22  Cuthriell, Delorise Royals, PA-C  methylPREDNISolone (MEDROL DOSEPAK) 4 MG TBPK tablet Take by mouth. 11/26/21   [provider]  naproxen (NAPROSYN) 500 MG tablet Take by mouth.    [provider]  nitrofurantoin, macrocrystal-monohydrate, (MACROBID) 100 MG capsule Take 1 capsule (100 mg total) by mouth 2 (two) times daily. 01/19/22   Immordino, Jeannett Senior, FNP  pregabalin (LYRICA) 75 MG capsule Take 75 mg by mouth 3 (three) times daily. 08/18/21   [provider]  traMADol (ULTRAM) 50 MG tablet Take 50 mg by mouth 3 (three) times daily as needed. 12/26/21   [provider]       DVT/GI PRX  assessed I Assessed the need for Labs I Assessed the need for Foley I Assessed the need for Central Venous Line Family Discussion when available I Assessed the need for Mobilization I made an Assessment of medications to be adjusted accordingly Safety Risk assessment completed  CASE DISCUSSED IN MULTIDISCIPLINARY ROUNDS WITH ICU TEAM     Critical Care Time devoted to patient care services described in this note is 65 minutes.   Critical care was necessary to treat /prevent imminent and life-threatening deterioration.   Lucie Leather, M.D.  [facility]  Medical Director [facility] Medical Director [facility] Cardio-Pulmonary Department

## 2022-02-25 NOTE — Progress Notes (Signed)
Patient transported to Vascular with CN RN Brand Males. At bedside. Uneventful. Vitals WDL prior to travel

## 2022-02-25 NOTE — Progress Notes (Signed)
Emergent bronch at bedside by Dr. Belia Heman and RT. Husband updated.

## 2022-02-25 NOTE — Op Note (Signed)
[facility]  Percutaneous Study/Intervention Procedural Note   Date of Surgery: 02/25/2022,3:40 PM  Surgeon: Festus Barren  Pre-operative Diagnosis: Symptomatic bilateral pulmonary emboli  Post-operative diagnosis:  Same  Procedure(s) Performed:  1.  Contrast injection right heart  2.  Mechanical thrombectomy to the right lower lobe, right middle lobe, and right upper lobe pulmonary arteries with the penumbra CAT 12 device  3.  Mechanical thrombectomy using the penumbra CAT 12 device to the left lower lobe and left upper lobe pulmonary arteries  4.  Selective catheter placement right lower lobe, middle lobe, and upper lobe pulmonary arteries  5.  Selective catheter placement left lower lobe and upper lobe pulmonary arteries    Anesthesia: Conscious sedation was administered under my direct supervision by the interventional radiology RN. IV Versed plus fentanyl were utilized. Continuous ECG, pulse oximetry and blood pressure was monitored throughout the entire procedure.  Versed and fentanyl were administered intravenously.  Conscious sedation was administered for a total of 58 minutes using 3 mg of Versed and 75 mcg of Fentanyl.  EBL: 200 cc  Sheath: 11 French left femoral vein  Contrast: 40 cc   Fluoroscopy Time: 13.9 minutes  Indications:  Patient presents with pulmonary emboli. The patient is symptomatic with hypoxemia and dyspnea on exertion.  There is evidence of right heart strain on the CT angiogram. The patient is otherwise a good candidate for intervention and even the long-term benefits pulmonary angiography with thrombolysis is offered. The risks and benefits are reviewed long-term benefits are discussed. All questions are answered patient agrees to proceed.  Procedure:  [name] a 61 y.o. female who was identified and appropriate procedural time out was performed.  The patient was then placed supine on the table and prepped and draped in  the usual sterile fashion.  Ultrasound was used to evaluate the left common femoral vein.  It was patent, as it was echolucent and compressible.  A digital ultrasound image was acquired for the permanent record.  A Seldinger needle was used to access the left common femoral vein under direct ultrasound guidance.  A 0.035 J wire was advanced without resistance and a 5Fr sheath was placed and then 2 ProGlide devices were secured in a preclose fashion and then we upsized to an 11 [place] sheath.    The wire and pigtail catheter were then negotiated into the right atrium and bolus injection of contrast was utilized to demonstrate the right ventricle and the pulmonary artery outflow. The advantage wire and JR4 catheter were then negotiated into the main pulmonary artery where hand injection of contrast was utilized to demonstrate the pulmonary arteries and confirm the locations of the pulmonary emboli.  I then advanced the JR4 catheter into the left lower lobe and then left upper lobe pulmonary arteries and perform selective imaging. This demonstrated moderate to severe thrombus burden in the left lower lobe and a moderate thrombus burden in the left upper lobe pulmonary artery.  The catheter was then negotiated into the right side and first cannulated the right lower lobe, and then right middle and upper lobe pulmonary arteries for selective imaging. This demonstrated fairly extensive thrombus burden in the right lower lobe, right middle lobe, and right upper lobe pulmonary arteries.   3000 Units of heparin was then given and allowed to circulate.   The Penumbra Cat 12 catheter was then advanced up into the pulmonary vasculature. The right lung was addressed first. Catheter was negotiated into the right lower lobe  and mechanical thrombectomy was performed with the help of the separator. Follow-up imaging demonstrated a good result and therefore the catheter was renegotiated into the right middle lobe pulmonary  artery and again mechanical thrombectomy was performed. Passes were made with both the Penumbra catheter itself as well as introducing the separator.  Finally, the catheter was negotiated into the right upper lobe pulmonary artery and mechanical thrombectomy was performed with the help of the separator.  Follow-up imaging was then performed.  Significant reduction in thrombus burden was seen throughout the 3 right lobar pulmonary arteries.  The Penumbra Cat 12 catheter was then negotiated to the opposite side. The left lung was then addressed. Catheter was negotiated into the left lower lobe and mechanical thrombectomy was performed with the use of separator. Follow-up imaging demonstrated a good result and therefore the catheter was renegotiated into the left upper lobe pulmonary artery and again mechanical thrombectomy was performed. Passes were made with both the Penumbra catheter itself as well as introducing the separator. Follow-up imaging was then performed.  Significant reduction in thrombus burden was seen in both the left lower and left upper lobes.  After review these images wires were reintroduced and the catheters removed. Then, the sheath is then pulled and pressures held. A safeguard is placed.    Findings:   Right heart imaging:  Right atrium and right ventricle and the pulmonary outflow tract appears moderately dilated  Right lung:  This demonstrated fairly extensive thrombus burden in the right lower lobe, right middle lobe, and right upper lobe pulmonary arteries  Left lung: This demonstrated moderate to severe thrombus burden in the left lower lobe and a moderate thrombus burden in the left upper lobe pulmonary artery.    Disposition: Patient was taken to the recovery room in stable condition having tolerated the procedure well.  [name] 02/25/2022,3:40 PM

## 2022-02-25 NOTE — Consult Note (Signed)
Pharmacy Antibiotic Note  [name] is a 61 y.o. female admitted on 02/24/2022 with  PE .  Pharmacy has been consulted for Unasyn dosing.  Patient had to be intubated post thrombectomy, with antibiotics being started to cover for aspiration pneumonia.  CXR: 2. Progressive bibasilar airspace disease, left greater than right.  Plan: Unasyn 3 grams IV every 6 hours.  Weight: 68.4 kg (150 lb 12.7 oz)  Temp (24hrs), Avg:99.8 F (37.7 C), Min:98.1 F (36.7 C), Max:103.1 F (39.5 C)  Recent Labs  Lab 02/24/22 1541 02/24/22 1854 02/24/22 2056 02/25/22 0258  WBC 15.7*  --   --  17.4*  CREATININE 0.62  --   --  0.73  LATICACIDVEN  --  1.9 1.0  --     Estimated Creatinine Clearance: 69.5 mL/min (by C-G formula based on SCr of 0.73 mg/dL).    Allergies  Allergen Reactions   Erythromycin Hives and Nausea And Vomiting   Lexapro [Escitalopram Oxalate] Hives    Antimicrobials this admission: Ceftriaxone/doxycycline/vancomycin 11/14 Unasyn 11/15 >>   Dose adjustments this admission: N/A  Microbiology results: 11/14 BCx: NG < 12 hours 11/14 MRSA PCR: not detected   Thank you for allowing pharmacy to be a part of this patient's care.  Barrie Folk, PharmD 02/25/2022 7:30 PM

## 2022-02-25 NOTE — Consult Note (Signed)
[facility] Vascular Consult Note  MRN : [id number]  [name] is a 61 y.o. (1960-10-20) female who presents with chief complaint of  Chief Complaint  Patient presents with   Shortness of Breath  .   Consulting Physician: DR. Hosie Poisson MD Reason for consult: Shortness of Breath/ Bilateral Pulmonary Embolism  History of Present Illness:  [name] is a 61 year old female with a history of hypertension, depression, anxiety, restless leg syndrome, GERD, multiple sclerosis, who presents to the emergency department for chief concern of shortness of breath yesterday.  A CTA PE was done which shows moderate thrombus burden involving both lungs with an RV to LV ratio of 1.3 which suggests acute on chronic right heart dysfunction or strain.  She is also noted to have right pleural effusion with patchy infiltrates to both lung fields.  Bilateral lower extremity venous doppler ultrasounds reveal an occlusive thrombus in both peroneal veins.  Patient was sitting up in bed this morning in no distress.  She is alert and oriented x4, on 4 L of oxygen via nasal cannula with an SPO2 of 94-95%.  Patient states that the severe shortness of breath started 3 to 4 days ago with chest pain mostly to her right sided rib cage.  She also states that she started to feel weak about 3 to 4 weeks ago.  At that point in time she stated that she was diagnosed as being COVID-positive and did have shortness of breath.  She also states that she felt like she was having a racing heart/palpitations without any significant chest pain.  She denies any recent international travel, long car trips or cancer that she knows or surgery.  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  Patient states that she has been n.p.o. since yesterday.  Patient states that she has never taken any anticoagulation medicine in the past.  Patient denies any prior  stroke/TIA symptoms.  Current Facility-Administered Medications  Medication Dose Route Frequency Provider Last Rate Last Admin   acetaminophen (TYLENOL) tablet 650 mg  650 mg Oral Q6H PRN Cox, Amy N, DO   650 mg at 02/25/22 0741   Or   acetaminophen (TYLENOL) suppository 650 mg  650 mg Rectal Q6H PRN Cox, Amy N, DO   650 mg at 02/25/22 0024   Chlorhexidine Gluconate Cloth 2 % PADS 6 each  6 each Topical Daily Cox, Amy N, DO       cyanocobalamin (VITAMIN B12) tablet 1,000 mcg  1,000 mcg Oral Daily Cox, Amy N, DO   1,000 mcg at 02/25/22 [id number]   dalfampridine TB12 10 mg  10 mg Oral Daily Cox, Amy N, DO       DULoxetine (CYMBALTA) DR capsule 30 mg  30 mg Oral Daily Cox, Amy N, DO   30 mg at 02/25/22 0917   gabapentin (NEURONTIN) capsule 400 mg  400 mg Oral BID Renda Rolls, RPH   400 mg at 02/25/22 [id number]   And   gabapentin (NEURONTIN) capsule 800 mg  800 mg Oral QHS Renda Rolls, RPH   800 mg at 02/25/22 0023   heparin ADULT infusion 100 units/mL (25000 units/23mL)  1,150 Units/hr Intravenous Continuous Renda Rolls, RPH 11.5 mL/hr at 02/25/22 0800 1,150 Units/hr at 02/25/22 0800   lactated ringers infusion   Intravenous Continuous Cox, Amy N, DO 150 mL/hr at 02/25/22 0837 New Bag at 02/25/22 0837   mupirocin cream (BACTROBAN)  2 %   Topical BID Hosie Poisson, MD       ondansetron (ZOFRAN) tablet 4 mg  4 mg Oral Q6H PRN Cox, Amy N, DO       Or   ondansetron (ZOFRAN) injection 4 mg  4 mg Intravenous Q6H PRN Cox, Amy N, DO   4 mg at 02/25/22 0019   pantoprazole (PROTONIX) EC tablet 40 mg  40 mg Oral Daily Cox, Amy N, DO   40 mg at 02/25/22 0917   pregabalin (LYRICA) capsule 75 mg  75 mg Oral TID Cox, Amy N, DO   75 mg at 02/25/22 0916   propranolol (INDERAL) tablet 20 mg  20 mg Oral BID Cox, Amy N, DO   20 mg at 02/25/22 [id number]   rOPINIRole (REQUIP) tablet 0.5 mg  0.5 mg Oral QHS Cox, Amy N, DO   0.5 mg at 02/25/22 [id number]   senna-docusate (Senokot-S) tablet 1 tablet  1 tablet Oral QHS PRN Cox,  Amy N, DO       traMADol (ULTRAM) tablet 50 mg  50 mg Oral TID PRN Cox, Amy N, DO   50 mg at 02/25/22 0400    Past Medical History:  Diagnosis Date   GERD (gastroesophageal reflux disease)    Multiple sclerosis ([place])     Past Surgical History:  Procedure Laterality Date   ESOPHAGOGASTRODUODENOSCOPY (EGD) WITH PROPOFOL N/A 09/11/2021   Procedure: ESOPHAGOGASTRODUODENOSCOPY (EGD) WITH PROPOFOL;  Surgeon: Annamaria Helling, DO;  Location: [facility] ENDOSCOPY;  Service: Gastroenterology;  Laterality: N/A;   TUBAL LIGATION      Social History Social History   Tobacco Use   Smoking status: Never   Smokeless tobacco: Never  Vaping Use   Vaping Use: Never used  Substance Use Topics   Alcohol use: Yes    Comment: Social   Drug use: Never    Family History History reviewed. No pertinent family history.  Allergies  Allergen Reactions   Erythromycin Hives and Nausea And Vomiting   Lexapro [Escitalopram Oxalate] Hives     REVIEW OF SYSTEMS (Negative unless checked)  Constitutional: [] Weight loss  [] Fever  [] Chills Cardiac: [x] Chest pain   [] Chest pressure   [] Palpitations   [x] Shortness of breath when laying flat   [x] Shortness of breath at rest   [x] Shortness of breath with exertion. Vascular:  [] Pain in legs with walking   [x] Pain in legs at rest   [] Pain in legs when laying flat   [] Claudication   [] Pain in feet when walking  [] Pain in feet at rest  [] Pain in feet when laying flat   [] History of DVT   [] Phlebitis   [] Swelling in legs   [] Varicose veins   [] Non-healing ulcers Pulmonary:   [] Uses home oxygen   [] Productive cough   [] Hemoptysis   [] Wheeze  [] COPD   [] Asthma Neurologic:  [] Dizziness  [] Blackouts   [] Seizures   [] History of stroke   [] History of TIA  [] Aphasia   [] Temporary blindness   [] Dysphagia   [] Weakness or numbness in arms   [] Weakness or numbness in legs Musculoskeletal:  [] Arthritis   [] Joint swelling   [] Joint pain   [] Low back pain Hematologic:  [] Easy  bruising  [] Easy bleeding   [] Hypercoagulable state   [] Anemic  [] Hepatitis Gastrointestinal:  [] Blood in stool   [] Vomiting blood  [] Gastroesophageal reflux/heartburn   [] Difficulty swallowing. Genitourinary:  [] Chronic kidney disease   [] Difficult urination  [] Frequent urination  [] Burning with urination   [] Blood in urine Skin:  [] Rashes   []   Ulcers   [] Wounds Psychological:  [x] History of anxiety   []  History of major depression.  Physical Examination  Vitals:   02/25/22 0600 02/25/22 0630 02/25/22 0700 02/25/22 0800  BP: (!) 88/58 (!) 92/54 92/60 (!) 91/58  Pulse: (!) 114 (!) 113 (!) 114 (!) 113  Resp: 17 17 16 17   Temp:    98.4 F (36.9 C)  TempSrc:    Oral  SpO2: 96% 96% 96% 96%  Weight:       Body mass index is 27.58 kg/m. Gen:  WD/WN, NAD Head: [place]/AT, No temporalis wasting. Prominent temp pulse not noted. Ear/Nose/Throat: Hearing grossly intact, nares w/o erythema or drainage, oropharynx w/o Erythema/Exudate Eyes: Sclera non-icteric, conjunctiva clear Neck: Trachea midline.  No JVD.  Pulmonary:  Good air movement, respirations not labored, equal bilaterally.  Cardiac: RRR, normal S1, S2. Vascular: Normal palpable pulses throughout. Vessel Right Left  Radial Palpable Palpable  Ulnar Palpable Palpable  Brachial Palpable Palpable  Carotid Palpable, without bruit Palpable, without bruit  Aorta Not palpable N/A  Femoral Palpable Palpable  Popliteal Palpable Palpable  PT Palpable Palpable  DP Palpable Palpable   Gastrointestinal: soft, non-tender/non-distended. No guarding/reflex.  Musculoskeletal: M/S 5/5 throughout.  Extremities without ischemic changes.  No deformity or atrophy. No edema. Neurologic: Sensation grossly intact in extremities.  Symmetrical.  Speech is fluent. Motor exam as listed above. Lower leg spasticity from HX of Multiple Sclerosis. Psychiatric: Judgment intact, Mood & affect appropriate for pt's clinical situation. Dermatologic: No rashes or ulcers  noted.  No cellulitis or open wounds. Lymph : No Cervical, Axillary, or Inguinal lymphadenopathy.    CBC Lab Results  Component Value Date   WBC 17.4 (H) 02/25/2022   HGB 8.0 (L) 02/25/2022   HCT 25.2 (L) 02/25/2022   MCV 94.4 02/25/2022   PLT 206 02/25/2022    BMET    Component Value Date/Time   NA 139 02/25/2022 0258   K 3.6 02/25/2022 0258   CL 109 02/25/2022 0258   CO2 23 02/25/2022 0258   GLUCOSE 119 (H) 02/25/2022 0258   BUN 12 02/25/2022 0258   CREATININE 0.73 02/25/2022 0258   CALCIUM 8.1 (L) 02/25/2022 0258   GFRNONAA >60 02/25/2022 0258   Estimated Creatinine Clearance: 69.5 mL/min (by C-G formula based on SCr of 0.73 mg/dL).  COAG Lab Results  Component Value Date   INR 1.3 (H) 02/24/2022    Radiology ECHOCARDIOGRAM COMPLETE  Result Date: 02/24/2022    ECHOCARDIOGRAM REPORT   Patient Name:   [name] Date of Exam: 02/24/2022 Medical Rec #:  02/26/2022             Height:       62.0 in Accession #:    02/26/2022            Weight:       126.1 lb Date of Birth:  [birth year]             BSA:          1.571 m Patient Age:    60 years              BP:           155/65 mmHg Patient Gender: F                     HR:           150 bpm. Exam Location:  [facility] Procedure: 2D Echo, Cardiac Doppler and Color Doppler Indications:  I26.09 Pulmonary Embolus  History:         Patient has no prior history of Echocardiogram examinations.                  Multiple Sclerosis.  Sonographer:     Cresenciano Lick RDCS Referring Phys:  [id number] AMY N COX Diagnosing Phys: Yolonda Kida MD IMPRESSIONS  1. Left ventricular ejection fraction, by estimation, is 60 to 65%. The left ventricle has normal function. The left ventricle has no regional wall motion abnormalities. Left ventricular diastolic parameters were normal.  2. Right ventricular systolic function is normal. The right ventricular size is normal.  3. The mitral valve is normal in structure. Trivial mitral valve  regurgitation.  4. The aortic valve is normal in structure. Aortic valve regurgitation is not visualized. FINDINGS  Left Ventricle: Left ventricular ejection fraction, by estimation, is 60 to 65%. The left ventricle has normal function. The left ventricle has no regional wall motion abnormalities. The left ventricular internal cavity size was normal in size. There is  no left ventricular hypertrophy. Left ventricular diastolic parameters were normal. Right Ventricle: The right ventricular size is normal. No increase in right ventricular wall thickness. Right ventricular systolic function is normal. Left Atrium: Left atrial size was normal in size. Right Atrium: Right atrial size was normal in size. Pericardium: There is no evidence of pericardial effusion. Mitral Valve: The mitral valve is normal in structure. Trivial mitral valve regurgitation. Tricuspid Valve: The tricuspid valve is normal in structure. Tricuspid valve regurgitation is not demonstrated. Aortic Valve: The aortic valve is normal in structure. Aortic valve regurgitation is not visualized. Pulmonic Valve: The pulmonic valve was normal in structure. Pulmonic valve regurgitation is not visualized. Aorta: The ascending aorta was not well visualized. IAS/Shunts: No atrial level shunt detected by color flow Doppler.  LEFT VENTRICLE PLAX 2D LVIDd:         3.70 cm LVIDs:         2.50 cm LV PW:         0.80 cm LV IVS:        0.80 cm LVOT diam:     1.70 cm LVOT Area:     2.27 cm  RIGHT VENTRICLE RV Basal diam:  3.70 cm RV S prime:     34.60 cm/s TAPSE (M-mode): 2.3 cm LEFT ATRIUM             Index        RIGHT ATRIUM          Index LA diam:        3.60 cm 2.29 cm/m   RA Area:     9.98 cm LA Vol (A2C):   19.7 ml 12.54 ml/m  RA Volume:   22.70 ml 14.45 ml/m LA Vol (A4C):   22.9 ml 14.57 ml/m LA Biplane Vol: 22.8 ml 14.51 ml/m   AORTA Ao Root diam: 3.00 cm Ao Asc diam:  3.10 cm TRICUSPID VALVE TR Peak grad:   49.3 mmHg TR Vmax:        351.00 cm/s  SHUNTS  Systemic Diam: 1.70 cm Yolonda Kida MD Electronically signed by Yolonda Kida MD Signature Date/Time: 02/24/2022/11:42:30 PM    Final    US Venous Img Lower Bilateral (DVT)  Result Date: 02/24/2022 CLINICAL DATA:  History of pulmonary embolus. EXAM: BILATERAL LOWER EXTREMITY VENOUS DOPPLER ULTRASOUND TECHNIQUE: Gray-scale sonography with graded compression, as well as color Doppler and duplex ultrasound were performed to evaluate the lower  extremity deep venous systems from the level of the common femoral vein and including the common femoral, femoral, profunda femoral, popliteal and calf veins including the posterior tibial, peroneal and gastrocnemius veins when visible. The superficial great saphenous vein was also interrogated. Spectral Doppler was utilized to evaluate flow at rest and with distal augmentation maneuvers in the common femoral, femoral and popliteal veins. COMPARISON:  CT scan of same day. FINDINGS: RIGHT LOWER EXTREMITY Common Femoral Vein: No evidence of thrombus. Normal compressibility, respiratory phasicity and response to augmentation. Saphenofemoral Junction: No evidence of thrombus. Normal compressibility and flow on color Doppler imaging. Profunda Femoral Vein: No evidence of thrombus. Normal compressibility and flow on color Doppler imaging. Femoral Vein: No evidence of thrombus. Normal compressibility, respiratory phasicity and response to augmentation. Popliteal Vein: No evidence of thrombus. Normal compressibility, respiratory phasicity and response to augmentation. Calf Veins: There appears to be occlusive thrombus in the peroneal vein. Venous Reflux:  None. Other Findings:  None. LEFT LOWER EXTREMITY Common Femoral Vein: No evidence of thrombus. Normal compressibility, respiratory phasicity and response to augmentation. Saphenofemoral Junction: No evidence of thrombus. Normal compressibility and flow on color Doppler imaging. Profunda Femoral Vein: No evidence of  thrombus. Normal compressibility and flow on color Doppler imaging. Femoral Vein: No evidence of thrombus. Normal compressibility, respiratory phasicity and response to augmentation. Popliteal Vein: No evidence of thrombus. Normal compressibility, respiratory phasicity and response to augmentation. Calf Veins: There appears to be occlusive thrombus in the peroneal vein. Superficial Great Saphenous Vein: No evidence of thrombus. Normal compressibility. Venous Reflux:  None. Other Findings:  None. IMPRESSION: Occlusive thrombus is noted in the peroneal veins bilaterally. Electronically Signed   By: Lupita Raider M.D.   On: 02/24/2022 18:54   CT Angio Chest PE W and/or Wo Contrast  Result Date: 02/24/2022 CLINICAL DATA:  Shortness of breath, right chest pain EXAM: CT ANGIOGRAPHY CHEST WITH CONTRAST TECHNIQUE: Multidetector CT imaging of the chest was performed using the standard protocol during bolus administration of intravenous contrast. Multiplanar CT image reconstructions and MIPs were obtained to evaluate the vascular anatomy. RADIATION DOSE REDUCTION: This exam was performed according to the departmental dose-optimization program which includes automated exposure control, adjustment of the mA and/or kV according to patient size and/or use of iterative reconstruction technique. CONTRAST:  106mL OMNIPAQUE IOHEXOL 350 MG/ML SOLN COMPARISON:  Chest radiograph done earlier today FINDINGS: Cardiovascular: Heart is enlarged in size. RV LV ratio is 1.3. This may be recent or chronic. There is homogeneous enhancement in thoracic aorta. There are filling defects in segmental and subsegmental branches in right upper lobe, right middle lobe, right lower lobe, left upper lobe and left lower lobe. There is moderate thrombus burden. Mediastinum/Nodes: No significant lymphadenopathy seen. Lungs/Pleura: There are patchy infiltrates in parahilar regions and lower lung fields, more so in right lower lobe. There is small right  pleural effusion. There is no pneumothorax. Upper Abdomen: There is moderate to large sized fixed hiatal hernia. Musculoskeletal: No acute findings are seen. Review of the MIP images confirms the above findings. IMPRESSION: Acute PE with moderate thrombus burden involving both lungs. RV-LV ratio is 1.3 suggesting acute or chronic right heart dysfunction. Small right pleural effusion. There are patchy infiltrates in parahilar regions and both lower lung fields, more so in right lower lobe suggesting atelectasis/pneumonia. Part of this finding could be due to developing pulmonary infarcts. Moderate to large sized fixed hiatal hernia. Imaging findings were relayed to patient's provider Dr. Artis Delay by telephone call.  Electronically Signed   By: Elmer Picker M.D.   On: 02/24/2022 17:13   DG Chest 2 View  Result Date: 02/24/2022 CLINICAL DATA:  Short of breath EXAM: CHEST - 2 VIEW COMPARISON:  None Available. FINDINGS: Heart size and vascularity normal. Negative for heart failure. Mild bibasilar airspace disease likely atelectasis. Small right pleural effusion. IMPRESSION: Mild bibasilar atelectasis.  Small right pleural effusion Electronically Signed   By: Franchot Gallo M.D.   On: 02/24/2022 16:05      Assessment/Plan 1.  Bilateral pulmonary embolisms.  Plan is to take the patient to the vascular lab today for pulmonary thrombectomy.  I spoke in great detail with both her and her husband about the procedure today.  Discussed in detail risks and complications related to the procedure.  Patient and husband were told there is a 3 to 5% chance of heart attack stroke or death related to procedure.  They are also instructed that there was a possibility of hematoma seroma at the insertion site of the catheter is for this pulmonary thrombectomy.  Both verbalize there understanding and wished to proceed with the procedure.  All of their questions were answered this morning.  We will get this case added to the  schedule.  2.  Bilateral peroneal lower extremity thrombus.  There is no intervention for this at this time.  Anticoagulation is recommended for this.  Patient is currently on heparin drip which will assist with this treatment.  Primary team will continue to monitor heparin.  3.  Multiple Sclerosis.  Recommend continuing all patient's home medications for her multiple sclerosis.  This will be managed by the primary team.  Plan of care discussed with Dr. Leotis Pain and he is in agreement with plan noted above.   Family Communication:  Total Time:75 I spent 75 minutes in this encounter including personally reviewing extensive medical records, personally reviewing imaging studies and compared to prior scans, counseling the patient, placing orders, coordinating care and performing appropriate documentation  Thank you for allowing [place] to participate in the care of this patient.   Drema Pry, NP [facility] [phone] (Office Phone) [phone] (Office Fax) [phone] (Pager)  02/25/2022 10:00 AM  Staff may message me via secure chat in [place]  but this may not receive immediate response,  please page for urgent matters!  Dictation software was used to generate the above note. Typos may occur and escape review, as with typed/written notes. Any error is purely unintentional.  Please contact me directly for clarity if needed.

## 2022-02-25 NOTE — Procedures (Signed)
PROCEDURE: BRONCHOSCOPY Therapeutic Aspiration of Tracheobronchial Tree  PROCEDURE DATE: 02/25/2022  TIME:  NAMEMally [name]  DOB:15-Feb-1961  MRN: [id number] LOC:  IC17A/IC17A-AA    HOSP DAY:  CODE STATUS:      Code Status Orders  (From admission, onward)           Start     Ordered   02/24/22 1725  Full code  Continuous        02/24/22 1726           Code Status History     This patient has a current code status but no historical code status.        DR Sandy Salaam WITH BRONCH   Indications/Preliminary Diagnosis: ACUTE RESP FAILURE WITH BLOOD CLOTS IN THE LUNGS  Consent: (Place X beside choice/s below)  The benefits, risks and possible complications of the procedure were        explained to:  ___ patient  ___ patient's family  ___ other:___________  who verbalized understanding and gave:  ___ verbal  ___ written  ___ verbal and written  ___ telephone  ___ other:________ consent.    x  Unable to obtain consent; procedure performed on emergent basis.     Other:       PRESEDATION ASSESSMENT: History and Physical has been performed. Patient meds and allergies have been reviewed. Presedation airway examination has been performed and documented. Baseline vital signs, sedation score, oxygenation status, and cardiac rhythm were reviewed. Patient was deemed to be in satisfactory condition to undergo the procedure.      PROCEDURE DETAILS: Timeout performed and correct patient, name, & ID confirmed. Following prep per Pulmonary policy, appropriate sedation was administered. The Bronchoscope was inserted in to oral cavity with bite block in place. Therapeutic aspiration of Tracheobronchial tree was performed.  Airway exam proceeded with findings, technical procedures, and specimen collection as noted below. At the end of exam the scope was withdrawn without incident. Impression and Plan as noted below.            Insertion Route (Place X  beside choice below)   Nasal   Oral  x Endotracheal Tube   Tracheostomy   INTRAPROCEDURE MEDICATIONS:  Sedative/Narcotic Amt Dose   Versed  mg   Fentanyl  mcg  Diprivan  mg       Medication Amt Dose  Medication Amt Dose  Lidocaine 1%  cc  Epinephrine 1:10,000 sol  cc  Xylocaine 4%  cc  Cocaine  cc   TECHNICAL PROCEDURES: (Place X beside choice below)   Procedures  Description    None     Electrocautery     Cryotherapy     Balloon Dilatation     Bronchography     Stent Placement   x  Therapeutic Aspiration EXTENSIVE AMOUNTS OF BLOOD CLOTS    Laser/Argon Plasma    Brachytherapy Catheter Placement    Foreign Body Removal         SPECIMENS (Sites): (Place X beside choice below)  Specimens Description   No Specimens Obtained     [place]   x Lavage  SEVERAL ATTEMPTS WITH AGGRESSIVE SALINE AND MUCOMYST LAVAGES   Biopsies    Fine Needle Aspirates    Brushings    Sputum             FINDINGS: EXTENSIVE AMOUNTS OF BLOOD CLOTS INTO ALL LEFT LUNG SEGMENTS AND RUL SEGMENTS, PATIENT WAS NOT UNABLE TO VENTILATE  EMERGENT BRONCHOSCOPY FOR RESP FAILURE DUE  TO BLOOD CLOTS PERFORMED  MULTIPLE FORCEPS AND SNARES WERE USEE TO EXTRACT THE BLOOD BLOOD CLOTS  PATIENT WAS EXTUBATED AND RE-INTUBATED WITH LARGER ETT FOR BETTER EXTRACTION, SEVERAL ATTEMPTS WITH AGGRESSIVE SALINE AND MUCOMYST LAVAGES  DR GONZALES AT BEDSIDE TO ASSIST WITH 3 PRONGED FORCEPS WHICH WAS ABLE TO RETRIEVE MORE CLOTS  PATIENT TOLERATED THE PROCEDURE FOR 3 HOURS MAINTAINING O2 SATS  >100%.    ESTIMATED BLOOD LOSS: none COMPLICATIONS/RESOLUTION: none   IMPRESSION:POST-PROCEDURE DX:  ACUTE HEMOPTYSIS AND BLOOD CLOTS   RECOMMENDATION/PLAN:  PLAN FOR REPEAT BRONCH IN 24 HRS    [name] Santiago Glad, M.D.  [facility]  Medical Director [facility] Medical Director [facility] Cardio-Pulmonary Department

## 2022-02-25 NOTE — Progress Notes (Signed)
[facility]                                                                               Andersyn Fragoso, is a 61 y.o. female, DOB - [birth year], MAU:[id number] Admit date - 02/24/2022    Outpatient Primary MD for the patient is [facility]  LOS - 1  days    Brief summary   [name] is a 61 year old female with history of hypertension, depression, anxiety, restless leg syndrome, GERD, who presents emergency department for chief concerns of shortness of breath via POV.  CTA PE: Was read as acute PE with moderate thrombus burden involving both lungs.  RV-LV ratio is 1.3 suggesting acute or chronic right heart dysfunction.  Small right pleural effusion.  Patchy infiltrates in the perihilar regions of both lung fields, right more than left.  Moderate to large size fixed hiatal hernia.  ED treatment: Ceftriaxone 2 g IV, vancomycin, LR 150 mL bolus, doxycycline.   Assessment & Plan    Assessment and Plan: * Bilateral pulmonary embolism (HCC) Started her on IV heparin, possible transition to eliquis after the procedures.  Vascular surgery consulted and recommendations given.  Echocardiogram unremarkable.  Venous duplex of the lower extremities show Occlusive thrombus is noted in the peroneal veins bilaterally.    Tachycardia Continue with propranolol.   RLS (restless legs syndrome) - Ropinirole 0.5 milligrams p.o. daily  Gastroesophageal reflux disease - PPI resumed  Chronic constipation - Senna docusate nightly as needed ordered  Multiple sclerosis (HCC) - Patient takes dalfampridine 10 mg every 12 hours, gabapentin 400 mg in the morning, 400 mg in the afternoon, 800 mg at night, these have been resumed - Patient takes tolterodine 2 mg daily and this is not available, patient states she will bring this medication to the hospital, pharmacy consultation for reliably ordered    Pressure injury present on admission.  RN Pressure  Injury Documentation: Pressure Injury Foot Anterior;Right;Medial Unstageable - Full thickness tissue loss in which the base of the injury is covered by slough (yellow, tan, gray, green or brown) and/or eschar (tan, brown or black) in the wound bed. brown/black wounds; look li (Active)     Location: Foot  Location Orientation: Anterior;Right;Medial  Staging: Unstageable - Full thickness tissue loss in which the base of the injury is covered by slough (yellow, tan, gray, green or brown) and/or eschar (tan, brown or black) in the wound bed.  Wound Description (Comments): brown/black wounds; look like scabs  Present on Admission: Yes  Dressing Type Foam - Lift dressing to assess site every shift 02/25/22 0730     Pressure Injury 02/25/22 Perineum Left;Posterior Deep Tissue Pressure Injury - Purple or maroon localized area of discolored intact skin or blood-filled blister due to damage of underlying soft tissue from pressure and/or shear. purple discoloration on l (Active)  02/25/22 0400  Location: Perineum  Location Orientation: Left;Posterior  Staging: Deep Tissue Pressure Injury - Purple or maroon localized area of discolored intact skin or blood-filled blister due to damage of underlying soft tissue from pressure and/or shear.  Wound Description (Comments): purple discoloration on left buttocks surrounded by redness along butt crack  Present  on Admission: Yes  Dressing Type Foam - Lift dressing to assess site every shift 02/25/22 0400   Wound care consulted and recommendations given.     Estimated body mass index is 27.58 kg/m as calculated from the following:   Height as of 12/02/21: 5\' 2"  (1.575 m).   Weight as of this encounter: 68.4 kg.  Code Status: full code.  DVT Prophylaxis:  Place TED hose Start: 02/24/22 1725   Level of Care: Level of care: Stepdown Family Communication:none at bedside.   Disposition Plan:     Remains inpatient appropriate:  further management of PE.   Procedures:  Echocardiogram  Venous duplex of the lower extremities.   Consultants:   Vascular surgery.   Antimicrobials:   Anti-infectives (From admission, onward)    Start     Dose/Rate Route Frequency Ordered Stop   02/24/22 1730  vancomycin (VANCOCIN) IVPB 1000 mg/200 mL premix        1,000 mg 200 mL/hr over 60 Minutes Intravenous  Once 02/24/22 1719 02/24/22 2209   02/24/22 1730  cefTRIAXone (ROCEPHIN) 2 g in sodium chloride 0.9 % 100 mL IVPB        2 g 200 mL/hr over 30 Minutes Intravenous  Once 02/24/22 1719 02/24/22 2040   02/24/22 1730  doxycycline (VIBRA-TABS) tablet 100 mg        100 mg Oral  Once 02/24/22 1719 02/24/22 1821        Medications  Scheduled Meds:  Chlorhexidine Gluconate Cloth  6 each Topical Daily   cyanocobalamin  1,000 mcg Oral Daily   dalfampridine  10 mg Oral Daily   DULoxetine  30 mg Oral Daily   gabapentin  400 mg Oral BID   And   gabapentin  800 mg Oral QHS   mupirocin cream   Topical BID   pantoprazole  40 mg Oral Daily   pregabalin  75 mg Oral TID   propranolol  20 mg Oral BID   rOPINIRole  0.5 mg Oral QHS   [START ON 02/26/2022] tolterodine  2 mg Oral Daily   Continuous Infusions:  heparin 1,150 Units/hr (02/25/22 1000)   lactated ringers 150 mL/hr at 02/25/22 1000   PRN Meds:.acetaminophen **OR** acetaminophen, ondansetron **OR** ondansetron (ZOFRAN) IV, senna-docusate, traMADol    Subjective:   02/27/22 was seen and examined today.  Some chest pain on deep breathing.  On 2lit of [place] oxygen.   Objective:   Vitals:   02/25/22 0600 02/25/22 0630 02/25/22 0700 02/25/22 0800  BP: (!) 88/58 (!) 92/54 92/60 (!) 91/58  Pulse: (!) 114 (!) 113 (!) 114 (!) 113  Resp: 17 17 16 17   Temp:    98.4 F (36.9 C)  TempSrc:    Oral  SpO2: 96% 96% 96% 96%  Weight:        Intake/Output Summary (Last 24 hours) at 02/25/2022 1102 Last data filed at 02/25/2022 1000 Gross per 24 hour  Intake 2634.64 ml  Output 300 ml   Net 2334.64 ml   Filed Weights   02/24/22 1710 02/25/22 0400  Weight: 57.2 kg 68.4 kg     Exam General: Alert and oriented x 3, NAD Cardiovascular: S1 S2 auscultated, no murmurs, RRR Respiratory: Clear to auscultation bilaterally, no wheezing, rales or rhonchi Gastrointestinal: Soft, nontender, nondistended, + bowel sounds Ext: no pedal edema bilaterally Neuro: AAOx3,  wheel chair bound.  Skin: No rashes Psych: Normal affect and demeanor, alert and oriented x3    Data Reviewed:  I have  personally reviewed following labs and imaging studies   CBC Lab Results  Component Value Date   WBC 17.4 (H) 02/25/2022   RBC 2.67 (L) 02/25/2022   HGB 8.0 (L) 02/25/2022   HCT 25.2 (L) 02/25/2022   MCV 94.4 02/25/2022   MCH 30.0 02/25/2022   PLT 206 02/25/2022   MCHC 31.7 02/25/2022   RDW 20.9 (H) 02/25/2022     Last metabolic panel Lab Results  Component Value Date   NA 139 02/25/2022   K 3.6 02/25/2022   CL 109 02/25/2022   CO2 23 02/25/2022   BUN 12 02/25/2022   CREATININE 0.73 02/25/2022   GLUCOSE 119 (H) 02/25/2022   GFRNONAA >60 02/25/2022   CALCIUM 8.1 (L) 02/25/2022   ANIONGAP 7 02/25/2022    CBG (last 3)  Recent Labs    02/25/22 0341  GLUCAP 124*      Coagulation Profile: Recent Labs  Lab 02/24/22 1854  INR 1.3*     Radiology Studies: ECHOCARDIOGRAM COMPLETE  Result Date: 02/24/2022    ECHOCARDIOGRAM REPORT   Patient Name:   LESHONDA GALAMBOS Date of Exam: 02/24/2022 Medical Rec #:  [id number]             Height:       62.0 in Accession #:    [id number]            Weight:       126.1 lb Date of Birth:  10/21/1960             BSA:          1.571 m Patient Age:    60 years              BP:           155/65 mmHg Patient Gender: F                     HR:           150 bpm. Exam Location:  [facility] Procedure: 2D Echo, Cardiac Doppler and Color Doppler Indications:     I26.09 Pulmonary Embolus  History:         Patient has no prior history of Echocardiogram  examinations.                  Multiple Sclerosis.  Sonographer:     Daphine Deutscher RDCS Referring Phys:  [id number] AMY N COX Diagnosing Phys: Alwyn Pea MD IMPRESSIONS  1. Left ventricular ejection fraction, by estimation, is 60 to 65%. The left ventricle has normal function. The left ventricle has no regional wall motion abnormalities. Left ventricular diastolic parameters were normal.  2. Right ventricular systolic function is normal. The right ventricular size is normal.  3. The mitral valve is normal in structure. Trivial mitral valve regurgitation.  4. The aortic valve is normal in structure. Aortic valve regurgitation is not visualized. FINDINGS  Left Ventricle: Left ventricular ejection fraction, by estimation, is 60 to 65%. The left ventricle has normal function. The left ventricle has no regional wall motion abnormalities. The left ventricular internal cavity size was normal in size. There is  no left ventricular hypertrophy. Left ventricular diastolic parameters were normal. Right Ventricle: The right ventricular size is normal. No increase in right ventricular wall thickness. Right ventricular systolic function is normal. Left Atrium: Left atrial size was normal in size. Right Atrium: Right atrial size was normal in size. Pericardium: There is no evidence of pericardial effusion. Mitral Valve:  The mitral valve is normal in structure. Trivial mitral valve regurgitation. Tricuspid Valve: The tricuspid valve is normal in structure. Tricuspid valve regurgitation is not demonstrated. Aortic Valve: The aortic valve is normal in structure. Aortic valve regurgitation is not visualized. Pulmonic Valve: The pulmonic valve was normal in structure. Pulmonic valve regurgitation is not visualized. Aorta: The ascending aorta was not well visualized. IAS/Shunts: No atrial level shunt detected by color flow Doppler.  LEFT VENTRICLE PLAX 2D LVIDd:         3.70 cm LVIDs:         2.50 cm LV PW:         0.80 cm  LV IVS:        0.80 cm LVOT diam:     1.70 cm LVOT Area:     2.27 cm  RIGHT VENTRICLE RV Basal diam:  3.70 cm RV S prime:     34.60 cm/s TAPSE (M-mode): 2.3 cm LEFT ATRIUM             Index        RIGHT ATRIUM          Index LA diam:        3.60 cm 2.29 cm/m   RA Area:     9.98 cm LA Vol (A2C):   19.7 ml 12.54 ml/m  RA Volume:   22.70 ml 14.45 ml/m LA Vol (A4C):   22.9 ml 14.57 ml/m LA Biplane Vol: 22.8 ml 14.51 ml/m   AORTA Ao Root diam: 3.00 cm Ao Asc diam:  3.10 cm TRICUSPID VALVE TR Peak grad:   49.3 mmHg TR Vmax:        351.00 cm/s  SHUNTS Systemic Diam: 1.70 cm Alwyn Pea MD Electronically signed by Alwyn Pea MD Signature Date/Time: 02/24/2022/11:42:30 PM    Final    US Venous Img Lower Bilateral (DVT)  Result Date: 02/24/2022 CLINICAL DATA:  History of pulmonary embolus. EXAM: BILATERAL LOWER EXTREMITY VENOUS DOPPLER ULTRASOUND TECHNIQUE: Gray-scale sonography with graded compression, as well as color Doppler and duplex ultrasound were performed to evaluate the lower extremity deep venous systems from the level of the common femoral vein and including the common femoral, femoral, profunda femoral, popliteal and calf veins including the posterior tibial, peroneal and gastrocnemius veins when visible. The superficial great saphenous vein was also interrogated. Spectral Doppler was utilized to evaluate flow at rest and with distal augmentation maneuvers in the common femoral, femoral and popliteal veins. COMPARISON:  CT scan of same day. FINDINGS: RIGHT LOWER EXTREMITY Common Femoral Vein: No evidence of thrombus. Normal compressibility, respiratory phasicity and response to augmentation. Saphenofemoral Junction: No evidence of thrombus. Normal compressibility and flow on color Doppler imaging. Profunda Femoral Vein: No evidence of thrombus. Normal compressibility and flow on color Doppler imaging. Femoral Vein: No evidence of thrombus. Normal compressibility, respiratory phasicity and  response to augmentation. Popliteal Vein: No evidence of thrombus. Normal compressibility, respiratory phasicity and response to augmentation. Calf Veins: There appears to be occlusive thrombus in the peroneal vein. Venous Reflux:  None. Other Findings:  None. LEFT LOWER EXTREMITY Common Femoral Vein: No evidence of thrombus. Normal compressibility, respiratory phasicity and response to augmentation. Saphenofemoral Junction: No evidence of thrombus. Normal compressibility and flow on color Doppler imaging. Profunda Femoral Vein: No evidence of thrombus. Normal compressibility and flow on color Doppler imaging. Femoral Vein: No evidence of thrombus. Normal compressibility, respiratory phasicity and response to augmentation. Popliteal Vein: No evidence of thrombus. Normal compressibility, respiratory phasicity and  response to augmentation. Calf Veins: There appears to be occlusive thrombus in the peroneal vein. Superficial Great Saphenous Vein: No evidence of thrombus. Normal compressibility. Venous Reflux:  None. Other Findings:  None. IMPRESSION: Occlusive thrombus is noted in the peroneal veins bilaterally. Electronically Signed   By: Lupita Raider M.D.   On: 02/24/2022 18:54   CT Angio Chest PE W and/or Wo Contrast  Result Date: 02/24/2022 CLINICAL DATA:  Shortness of breath, right chest pain EXAM: CT ANGIOGRAPHY CHEST WITH CONTRAST TECHNIQUE: Multidetector CT imaging of the chest was performed using the standard protocol during bolus administration of intravenous contrast. Multiplanar CT image reconstructions and MIPs were obtained to evaluate the vascular anatomy. RADIATION DOSE REDUCTION: This exam was performed according to the departmental dose-optimization program which includes automated exposure control, adjustment of the mA and/or kV according to patient size and/or use of iterative reconstruction technique. CONTRAST:  19mL OMNIPAQUE IOHEXOL 350 MG/ML SOLN COMPARISON:  Chest radiograph done earlier  today FINDINGS: Cardiovascular: Heart is enlarged in size. RV LV ratio is 1.3. This may be recent or chronic. There is homogeneous enhancement in thoracic aorta. There are filling defects in segmental and subsegmental branches in right upper lobe, right middle lobe, right lower lobe, left upper lobe and left lower lobe. There is moderate thrombus burden. Mediastinum/Nodes: No significant lymphadenopathy seen. Lungs/Pleura: There are patchy infiltrates in parahilar regions and lower lung fields, more so in right lower lobe. There is small right pleural effusion. There is no pneumothorax. Upper Abdomen: There is moderate to large sized fixed hiatal hernia. Musculoskeletal: No acute findings are seen. Review of the MIP images confirms the above findings. IMPRESSION: Acute PE with moderate thrombus burden involving both lungs. RV-LV ratio is 1.3 suggesting acute or chronic right heart dysfunction. Small right pleural effusion. There are patchy infiltrates in parahilar regions and both lower lung fields, more so in right lower lobe suggesting atelectasis/pneumonia. Part of this finding could be due to developing pulmonary infarcts. Moderate to large sized fixed hiatal hernia. Imaging findings were relayed to patient's provider Dr. Artis Delay by telephone call. Electronically Signed   By: Ernie Avena M.D.   On: 02/24/2022 17:13   DG Chest 2 View  Result Date: 02/24/2022 CLINICAL DATA:  Short of breath EXAM: CHEST - 2 VIEW COMPARISON:  None Available. FINDINGS: Heart size and vascularity normal. Negative for heart failure. Mild bibasilar airspace disease likely atelectasis. Small right pleural effusion. IMPRESSION: Mild bibasilar atelectasis.  Small right pleural effusion Electronically Signed   By: Marlan Palau M.D.   On: 02/24/2022 16:05       Kathlen Mody M.D. [facility] 02/25/2022, 11:02 AM  Available via Epic secure chat 7am-7pm After 7 pm, please refer to night coverage provider listed  on amion.

## 2022-02-25 NOTE — Progress Notes (Addendum)
Patient requested the following:  Magnesium   D3   Tolterodine for bladder hyperactivity  Pharmacy at bedside to discuss with patient.

## 2022-02-25 NOTE — Progress Notes (Signed)
At approx 1520 pt O2 sats below 90% and pt less responsive, jaw thrust brought pt sats to 92% but then sats dropped to 85% and switched to NRB, O2 sats intially responsive to 100% then started falling to below 90% jaw thrust minimally effective.  Procedure finishing and blood noted in mask. Pt suctioned and approx 3-5 mls from mouth.  Pt less responsive and weak cough.  Charged called and Photographer at bedside pt transported to [facility]  Report call to [employer], Charity fundraiser, after intubation

## 2022-02-25 NOTE — Consult Note (Signed)
NAME:  [name], MRN:  [id number], DOB:  01/25/1961, LOS: 1 ADMISSION DATE:  02/24/2022, CONSULTATION DATE:  02/25/2022 REFERRING MD:  Dr. Lucky Cowboy, CHIEF COMPLAINT:  Acute Respiratory Distress & hypoxia, development of Hemoptysis post thrombectomy  Brief Pt Description / Synopsis:  61 y.o. female admitted with Acute Hypoxic Respiratory Failure in setting of Bilateral Pulmonary Embolism.  Underwent thrombectomy with Vascular Surgery, post procedure developed Hemoptysis requiring emergent intubation.  History of Present Illness:  [name] is a 61 year old female with history of hypertension, depression, anxiety, restless leg syndrome, GERD, who presents emergency department for chief concerns of shortness of breath via POV.  Pt is currently intubated and sedation and unable to contribute to history, therefore history is obtained from chart review.   Per ED and nursing notes, she reported that she developed shortness of breath 3 to 4 days ago.  She denies known fever at home and trauma to her person.  She denies chest pain, abdominal pain, dysuria, hematuria.  She did have 1 episode of diarrhea today.  She denies blood in her stool.  She endorses having COVID about 4 weeks ago.   She denies recent international travel or long car trips.  She denies history of cancer that she knows of.  She denies recent surgery.   She denies changes to her leg pain.  She states at baseline she has leg pain because of MS and restless leg syndrome.  ED Course: Initial Vital Signs:  temperature of 99.1, respiration rate of 20, heart rate of 140, blood pressure 124/59, SPO2 of 94% on room air.  Significant Labs: Serum sodium is 138, potassium 2.7, chloride 106, bicarb 23, BUN of 14, serum creatinine 0.62, EGFR greater than 60, nonfasting blood glucose 106, WBC 15.7, hemoglobin 9.3, platelets of 231.  Imaging CTA PE: Was read as acute PE with moderate thrombus burden involving both lungs.   RV-LV ratio is 1.3 suggesting acute or chronic right heart dysfunction.  Small right pleural effusion.  Patchy infiltrates in the perihilar regions of both lung fields, right more than left.  Moderate to large size fixed hiatal hernia.  Bilateral lower extremity venous doppler ultrasounds>>  occlusive thrombus in both peroneal veins.  Medications Administered: Ceftriaxone 2 g IV, vancomycin, LR 150 mL bolus, doxycycline.   Hospitalist were asked to admit for further workup and treatment.  Vascular Surgery was consulted with plan for Thrombectomy.  Please see "significant hospital events" section below for full detailed hospital course.    Pertinent  Medical History   Past Medical History:  Diagnosis Date   GERD (gastroesophageal reflux disease)    Multiple sclerosis ([place])      Micro Data:  11/14: Blood culture x2>> 11/15: MRSA PCR>>negative  Antimicrobials:  Ceftriaxone 11/14>>11/14 Doxycycline 11/14>>11/14 Vancomycin 11/14>>11/14  Significant Hospital Events: Including procedures, antibiotic start and stop dates in addition to other pertinent events   11/14: Admitted by Hospitalist.  Vascular Surgery consulted 11/15:  Thrombectomy performed, massive amounts of clot removed.  Post procedure developed Acute Hemoptysis requiring emergent intubation and transfer to ICU.  PCCM consulted, plan for emergent Bronchoscopy  Interim History / Subjective:  As noted in Significant hospital events section above  Objective   Blood pressure 112/64, pulse (!) 115, temperature 98.1 F (36.7 C), temperature source Oral, resp. rate 20, weight 68.4 kg, SpO2 100 %.        Intake/Output Summary (Last 24 hours) at 02/25/2022 1706 Last data filed at 02/25/2022 1400 Gross per 24 hour  Intake 3243.33 ml  Output 500 ml  Net 2743.33 ml   Filed Weights   02/24/22 1710 02/25/22 0400  Weight: 57.2 kg 68.4 kg    Examination: General: Critically ill appearing female, laying in bed, intubated  and sedation with hemoptysis HENT: Atraumatic, normocephalic, neck supple, no JVD, orally intubated Lungs: Coarse breath sounds throughout, synchronous with vent, even Cardiovascular: Tachycardia, regular rhythm, s1s2, no M/R/G Abdomen: Soft, nontender, nondistended, no guarding or rebound tenderness Extremities: Normal bulk and tone, no deformities Neuro: Heavily sedated following recent emergent intubation GU: Deferred  Resolved Hospital Problem list     Assessment & Plan:   Acute Hypoxic Respiratory Failure in setting of Bilateral Pulmonary Embolism with development of Hemoptysis S/p Thrombectomy with Dr. Lucky Cowboy on 02/25/22 -Full vent support, implement lung protective strategies -Plateau pressures less than 30 cm H20 -Wean FiO2 & PEEP as tolerated to maintain O2 sats >92% -Follow intermittent Chest X-ray & ABG as needed -Spontaneous Breathing Trials when respiratory parameters met and mental status permits -Implement VAP Bundle -Bronchodilators -Bronchoscopy to be performed by Dr. Mortimer Fries 11/15 -Start Steroids -Chest PT -Hold Heparin for now  Bilateral Pulmonary Embolism & Bilateral peroneal lower extremity thrombus, suspect in setting of recent COVID-19 infection about 3 weeks prior Echocardiogram 02/24/22: LVEF 82-95%, normal diastolic parameters, RV size and systolic function normal -Continuous cardiac monitoring -Maintain MAP >65 -IV fluids -Vasopressors as needed to maintain MAP goal -HS Troponin negative -Will have to hold Heparin for now in setting of hemoptysis  Sedation needs in setting of Mechanical Ventilation PMHx: Multiple Sclerosis, Restless Leg syndrome -Maintain a RASS goal of 0 to -1 -Fentanyl and propofol to maintain RASS goal -Avoid sedating medications as able -Daily wake up assessment      Pt is critically ill.  High risk for decompensation, cardiac arrest, and death.   Best Practice (right click and "Reselect all SmartList Selections" daily)    Diet/type: NPO DVT prophylaxis: SCD GI prophylaxis: PPI Lines: N/A Foley:  N/A Code Status:  full code Last date of multidisciplinary goals of care discussion [N/A]  11/15: Pt's husband updated at bedside.  Labs   CBC: Recent Labs  Lab 02/24/22 1541 02/25/22 0258  WBC 15.7* 17.4*  HGB 9.3* 8.0*  HCT 29.6* 25.2*  MCV 91.6 94.4  PLT 231 621    Basic Metabolic Panel: Recent Labs  Lab 02/24/22 1541 02/25/22 0258  NA 138 139  K 3.7 3.6  CL 106 109  CO2 23 23  GLUCOSE 106* 119*  BUN 14 12  CREATININE 0.62 0.73  CALCIUM 9.2 8.1*   GFR: Estimated Creatinine Clearance: 69.5 mL/min (by C-G formula based on SCr of 0.73 mg/dL). Recent Labs  Lab 02/24/22 1541 02/24/22 1854 02/24/22 2056 02/25/22 0258  PROCALCITON  --  0.23  --   --   WBC 15.7*  --   --  17.4*  LATICACIDVEN  --  1.9 1.0  --     Liver Function Tests: No results for input(s): "AST", "ALT", "ALKPHOS", "BILITOT", "PROT", "ALBUMIN" in the last 168 hours. No results for input(s): "LIPASE", "AMYLASE" in the last 168 hours. No results for input(s): "AMMONIA" in the last 168 hours.  ABG No results found for: "PHART", "PCO2ART", "PO2ART", "HCO3", "TCO2", "ACIDBASEDEF", "O2SAT"   Coagulation Profile: Recent Labs  Lab 02/24/22 1854  INR 1.3*    Cardiac Enzymes: No results for input(s): "CKTOTAL", "CKMB", "CKMBINDEX", "TROPONINI" in the last 168 hours.  HbA1C: No results found for: "HGBA1C"  CBG: Recent Labs  Lab 02/25/22 0341  GLUCAP 124*    Review of Systems:   Unable to assess due to critical illness, intubation, sedation   Past Medical History:  She,  has a past medical history of GERD (gastroesophageal reflux disease) and Multiple sclerosis ([place]).   Surgical History:   Past Surgical History:  Procedure Laterality Date   ESOPHAGOGASTRODUODENOSCOPY (EGD) WITH PROPOFOL N/A 09/11/2021   Procedure: ESOPHAGOGASTRODUODENOSCOPY (EGD) WITH PROPOFOL;  Surgeon: Annamaria Helling, DO;   Location: [facility] ENDOSCOPY;  Service: Gastroenterology;  Laterality: N/A;   TUBAL LIGATION       Social History:   reports that she has never smoked. She has never used smokeless tobacco. She reports current alcohol use. She reports that she does not use drugs.   Family History:  Her family history is not on file.   Allergies Allergies  Allergen Reactions   Erythromycin Hives and Nausea And Vomiting   Lexapro [Escitalopram Oxalate] Hives     Home Medications  Prior to Admission medications   Medication Sig Start Date End Date Taking? Authorizing Provider  cyanocobalamin 1000 MCG tablet Take by mouth.   Yes [provider]  DULoxetine (CYMBALTA) 30 MG capsule Take 30 mg by mouth daily. 06/25/21  Yes [provider]  gabapentin (NEURONTIN) 400 MG capsule Take Gabapentin 400 mg in the morning, 400 mg in the afternoon, and 800 mg at night 12/29/21  Yes [provider]  omeprazole (PRILOSEC) 20 MG capsule Take 20 mg by mouth daily. 06/10/21  Yes [provider]  propranolol (INDERAL) 20 MG tablet Take 1 tablet by mouth 2 (two) times daily. 12/16/21  Yes [provider]  rOPINIRole (REQUIP) 0.25 MG tablet Take 0.25 mg at night for 1 week, then increase to 0.5 mg at night 05/29/21  Yes [provider]  tolterodine (DETROL LA) 2 MG 24 hr capsule Take 2 mg by mouth 2 (two) times daily. 12/29/21 12/29/22 Yes [provider]  ascorbic acid (VITAMIN C) 500 MG tablet Take by mouth.    [provider]  Cholecalciferol 50 MCG (2000 UT) CAPS Take by mouth.    [provider]  cyclobenzaprine (FLEXERIL) 10 MG tablet Take 10 mg by mouth 2 (two) times daily as needed. 11/26/21   [provider]  dalfampridine 10 MG TB12 Take 1 tablet by mouth every 12 (twelve) hours. 12/29/21   [provider]  meloxicam (MOBIC) 15 MG tablet Take 1 tablet (15 mg total) by mouth daily. 08/23/21 08/23/22  Cuthriell, Charline Bills, PA-C   methylPREDNISolone (MEDROL DOSEPAK) 4 MG TBPK tablet Take by mouth. 11/26/21   [provider]  naproxen (NAPROSYN) 500 MG tablet Take by mouth.    [provider]  nitrofurantoin, macrocrystal-monohydrate, (MACROBID) 100 MG capsule Take 1 capsule (100 mg total) by mouth 2 (two) times daily. 01/19/22   Immordino, Annie Main, FNP  pregabalin (LYRICA) 75 MG capsule Take 75 mg by mouth 3 (three) times daily. 08/18/21   [provider]  traMADol (ULTRAM) 50 MG tablet Take 50 mg by mouth 3 (three) times daily as needed. 12/26/21   [provider]     Critical care time: 50 minutes     Darel Hong, AGACNP-BC [facility] [place] epic messenger for cross cover needs If after hours, please call E-link

## 2022-02-25 NOTE — Consult Note (Signed)
WOC Nurse Consult Note: Reason for Consult: pressure injury buttocks and right foot Met with patient and her CG at the bedside; wound on the right foot is from trauma from her WC, she is WC bound and her foot gets caught on the foot rest I do not visualize anything on her bilateral buttocks. Prophylactic foam dressing in place for high risk patient on the sacrum  Wound type:trauma; right foot; partial thickness Pressure Injury POA: NA Measurement:1.5cm x 1.0cm x 0.1cm  Wound NGF:REVQ; brown; crust  Drainage (amount, consistency, odor) scant, non purulent  Periwound:intact  Dressing procedure/placement/frequency: Bactroban ointment BID and foam dressing.   Discussed POC with patient and bedside nurse.  Re consult if needed, will not follow at this time. Thanks  Waldine Zenz [employer], RN,CWOCN, CNS, [place] [phone])

## 2022-02-25 NOTE — Progress Notes (Signed)
Rt assisted with bedside bronchoscopy with Therapeutic Flexible bronchoscope. Time out and consent obtained prior to procedure. Patient tolerated procedure well with saturations remaining 100% for there duration of the procedure.

## 2022-02-26 ENCOUNTER — Encounter: Payer: Self-pay | Admitting: Vascular Surgery

## 2022-02-26 DIAGNOSIS — Z9889 Other specified postprocedural states: Secondary | ICD-10-CM

## 2022-02-26 DIAGNOSIS — I2699 Other pulmonary embolism without acute cor pulmonale: Secondary | ICD-10-CM

## 2022-02-26 DIAGNOSIS — J95821 Acute postprocedural respiratory failure: Secondary | ICD-10-CM

## 2022-02-26 DIAGNOSIS — Z9911 Dependence on respirator [ventilator] status: Secondary | ICD-10-CM

## 2022-02-26 LAB — BASIC METABOLIC PANEL
Anion gap: 6 (ref 5–15)
Anion gap: 8 (ref 5–15)
BUN: 14 mg/dL (ref 6–20)
BUN: 14 mg/dL (ref 6–20)
CO2: 23 mmol/L (ref 22–32)
CO2: 22 mmol/L (ref 22–32)
Calcium: 7.1 mg/dL — ABNORMAL LOW (ref 8.9–10.3)
Calcium: 7.7 mg/dL — ABNORMAL LOW (ref 8.9–10.3)
Chloride: 111 mmol/L (ref 98–111)
Chloride: 109 mmol/L (ref 98–111)
Creatinine, Ser: 0.72 mg/dL (ref 0.44–1.00)
Creatinine, Ser: 0.68 mg/dL (ref 0.44–1.00)
GFR, Estimated: >60 mL/min (ref >60)
GFR, Estimated: >60 mL/min (ref >60)
Glucose, Bld: 104 mg/dL — ABNORMAL HIGH (ref 70–99)
Glucose, Bld: 123 mg/dL — ABNORMAL HIGH (ref 70–99)
Potassium: 4.1 mmol/L (ref 3.5–5.1)
Potassium: 4.1 mmol/L (ref 3.5–5.1)
Sodium: 140 mmol/L (ref 135–145)
Sodium: 139 mmol/L (ref 135–145)

## 2022-02-26 LAB — CBC
HCT: 18.1 % — ABNORMAL LOW (ref 36.0–46.0)
HCT: 21.2 % — ABNORMAL LOW (ref 36.0–46.0)
Hemoglobin: 5.6 g/dL — ABNORMAL LOW (ref 12.0–15.0)
Hemoglobin: 6.6 g/dL — ABNORMAL LOW (ref 12.0–15.0)
MCH: 29 pg (ref 26.0–34.0)
MCH: 28.9 pg (ref 26.0–34.0)
MCHC: 30.9 g/dL (ref 30.0–36.0)
MCHC: 31.1 g/dL (ref 30.0–36.0)
MCV: 93.8 fL (ref 80.0–100.0)
MCV: 93 fL (ref 80.0–100.0)
Platelets: 182 10*3/uL (ref 150–400)
Platelets: 187 10*3/uL (ref 150–400)
RBC: 1.93 MIL/uL — ABNORMAL LOW (ref 3.87–5.11)
RBC: 2.28 MIL/uL — ABNORMAL LOW (ref 3.87–5.11)
RDW: 20.4 % — ABNORMAL HIGH (ref 11.5–15.5)
RDW: 20 % — ABNORMAL HIGH (ref 11.5–15.5)
WBC: 26.3 10*3/uL — ABNORMAL HIGH (ref 4.0–10.5)
WBC: 30.3 10*3/uL — ABNORMAL HIGH (ref 4.0–10.5)
nRBC: 0 % (ref 0.0–0.2)
nRBC: 0 % (ref 0.0–0.2)

## 2022-02-26 LAB — GLUCOSE, CAPILLARY
Glucose-Capillary: 105 mg/dL — ABNORMAL HIGH (ref 70–99)
Glucose-Capillary: 149 mg/dL — ABNORMAL HIGH (ref 70–99)
Glucose-Capillary: 166 mg/dL — ABNORMAL HIGH (ref 70–99)
Glucose-Capillary: 176 mg/dL — ABNORMAL HIGH (ref 70–99)

## 2022-02-26 LAB — BLOOD GAS, VENOUS
Acid-base deficit: 4 mmol/L — ABNORMAL HIGH (ref 0.0–2.0)
Bicarbonate: 22.6 mmol/L (ref 20.0–28.0)
O2 Saturation: 59.3 %
Patient temperature: 37
pCO2, Ven: 46 mmHg (ref 44–60)
pH, Ven: 7.3 (ref 7.25–7.43)
pO2, Ven: 36 mmHg (ref 32–45)

## 2022-02-26 LAB — HEMOGLOBIN AND HEMATOCRIT, BLOOD
HCT: 23.1 % — ABNORMAL LOW (ref 36.0–46.0)
HCT: 21.4 % — ABNORMAL LOW (ref 36.0–46.0)
HCT: 25.2 % — ABNORMAL LOW (ref 36.0–46.0)
Hemoglobin: 7.2 g/dL — ABNORMAL LOW (ref 12.0–15.0)
Hemoglobin: 6.9 g/dL — ABNORMAL LOW (ref 12.0–15.0)
Hemoglobin: 8.4 g/dL — ABNORMAL LOW (ref 12.0–15.0)

## 2022-02-26 LAB — HEPATIC FUNCTION PANEL
ALT: 17 U/L (ref 0–44)
AST: 17 U/L (ref 15–41)
Albumin: 2.5 g/dL — ABNORMAL LOW (ref 3.5–5.0)
Alkaline Phosphatase: 96 U/L (ref 38–126)
Bilirubin, Direct: 0.5 mg/dL — ABNORMAL HIGH (ref 0.0–0.2)
Indirect Bilirubin: 0.8 mg/dL (ref 0.3–0.9)
Total Bilirubin: 1.3 mg/dL — ABNORMAL HIGH (ref 0.3–1.2)
Total Protein: 5.8 g/dL — ABNORMAL LOW (ref 6.5–8.1)

## 2022-02-26 LAB — MAGNESIUM
Magnesium: 1.7 mg/dL (ref 1.7–2.4)
Magnesium: 1.8 mg/dL (ref 1.7–2.4)

## 2022-02-26 LAB — PHOSPHORUS
Phosphorus: 2.6 mg/dL (ref 2.5–4.6)
Phosphorus: 3 mg/dL (ref 2.5–4.6)

## 2022-02-26 LAB — PREPARE RBC (CROSSMATCH)

## 2022-02-26 LAB — SAMPLE TO BLOOD BANK

## 2022-02-26 LAB — LACTIC ACID, PLASMA: Lactic Acid, Venous: 1.5 mmol/L (ref 0.5–1.9)

## 2022-02-26 LAB — ABO/RH: ABO/RH(D): O POS

## 2022-02-26 LAB — TRIGLYCERIDES: Triglycerides: 53 mg/dL (ref <150)

## 2022-02-26 MED ORDER — INSULIN ASPART 100 UNIT/ML IJ SOLN
0.0000 [IU] | INTRAMUSCULAR | Status: DC
  Administered 2022-02-27 (×2): 2 [IU] via SUBCUTANEOUS
  Administered 2022-02-27: 3 [IU] via SUBCUTANEOUS
  Administered 2022-02-27: 2 [IU] via SUBCUTANEOUS
  Administered 2022-02-27 (×2): 3 [IU] via SUBCUTANEOUS
  Administered 2022-02-28 (×2): 2 [IU] via SUBCUTANEOUS
  Administered 2022-02-28: 3 [IU] via SUBCUTANEOUS
  Administered 2022-02-28: 2 [IU] via SUBCUTANEOUS
  Administered 2022-02-28: 3 [IU] via SUBCUTANEOUS
  Administered 2022-02-28 – 2022-03-01 (×2): 2 [IU] via SUBCUTANEOUS
  Administered 2022-03-01: 3 [IU] via SUBCUTANEOUS
  Administered 2022-03-01 – 2022-03-02 (×5): 2 [IU] via SUBCUTANEOUS
  Administered 2022-03-02 – 2022-03-03 (×4): 3 [IU] via SUBCUTANEOUS
  Administered 2022-03-03 – 2022-03-06 (×10): 2 [IU] via SUBCUTANEOUS
  Administered 2022-03-06 – 2022-03-07 (×2): 3 [IU] via SUBCUTANEOUS
  Administered 2022-03-07 – 2022-03-08 (×5): 2 [IU] via SUBCUTANEOUS
  Administered 2022-03-08: 3 [IU] via SUBCUTANEOUS
  Administered 2022-03-08: 2 [IU] via SUBCUTANEOUS
  Administered 2022-03-08: 3 [IU] via SUBCUTANEOUS
  Administered 2022-03-09 – 2022-03-15 (×28): 2 [IU] via SUBCUTANEOUS
  Administered 2022-03-15: 3 [IU] via SUBCUTANEOUS
  Administered 2022-03-16 – 2022-03-19 (×18): 2 [IU] via SUBCUTANEOUS
  Filled 2022-02-26 (×92): qty 1

## 2022-02-26 MED ORDER — ALBUMIN HUMAN 25 % IV SOLN
25.0000 g | Freq: Four times a day (QID) | INTRAVENOUS | Status: AC
  Administered 2022-02-26 (×2): 25 g via INTRAVENOUS
  Filled 2022-02-26 (×2): qty 100

## 2022-02-26 MED ORDER — SODIUM CHLORIDE 0.9% IV SOLUTION
Freq: Once | INTRAVENOUS | Status: AC
  Administered 2022-02-26: 10 mL/h via INTRAVENOUS

## 2022-02-26 MED ORDER — MAGNESIUM SULFATE 2 GM/50ML IV SOLN
2.0000 g | Freq: Once | INTRAVENOUS | Status: DC
  Filled 2022-02-26: qty 50

## 2022-02-26 MED ORDER — MAGNESIUM SULFATE 2 GM/50ML IV SOLN
2.0000 g | Freq: Once | INTRAVENOUS | Status: AC
  Administered 2022-02-26: 2 g via INTRAVENOUS
  Filled 2022-02-26: qty 50

## 2022-02-26 MED ORDER — MIDAZOLAM HCL 2 MG/2ML IJ SOLN
4.0000 mg | Freq: Once | INTRAMUSCULAR | Status: AC
  Administered 2022-02-26: 4 mg via INTRAVENOUS
  Filled 2022-02-26: qty 4

## 2022-02-26 MED ORDER — SODIUM CHLORIDE 0.9% IV SOLUTION: Freq: Once | INTRAVENOUS | Status: DC

## 2022-02-26 NOTE — Progress Notes (Signed)
PHARMACY CONSULT NOTE  Pharmacy Consult for Electrolyte Monitoring and Replacement   Recent Labs: Potassium (mmol/L)  Date Value  02/26/2022 4.1   Magnesium (mg/dL)  Date Value  23/34/3568 1.8   Calcium (mg/dL)  Date Value  61/68/3729 7.7 (L)   Albumin (g/dL)  Date Value  05/24/1550 2.5 (L)   Phosphorus (mg/dL)  Date Value  11/13/2334 3.0   Sodium (mmol/L)  Date Value  02/26/2022 139     Assessment: 61 yo female presented to ED with SOB when lying flat and some right sided rib and shoulder pain.  Chest CT showed acute PE.  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 Pharmacy is asked to follow and replace electrolytes while in CCU  Goal of Therapy:  Electrolytes WNL  Plan:  2 grams IV magnesium sulfate x 1 \\recheck  electrolytes in am  Lowella Bandy ,PharmD Clinical Pharmacist 02/26/2022 7:18 AM

## 2022-02-26 NOTE — Progress Notes (Signed)
MD Kasa would like to hold SBT for today and plans for tomorrow (Friday). Family updated by this RN and verbalizes understanding.

## 2022-02-26 NOTE — Consult Note (Signed)
NAME:  [name], MRN:  [id number], DOB:  07-07-1960, LOS: 2 ADMISSION DATE:  02/24/2022, CONSULTATION DATE:  02/25/2022 REFERRING MD:  Dr. Lucky Cowboy, CHIEF COMPLAINT:  Acute Respiratory Distress & hypoxia, development of Hemoptysis post thrombectomy  Brief Pt Description / Synopsis:  61 y.o. female admitted with Acute Hypoxic Respiratory Failure in setting of Bilateral Pulmonary Embolism.  Underwent thrombectomy with Vascular Surgery, post procedure developed Hemoptysis requiring emergent intubation.  History of Present Illness:  [name] is a 61 year old female with history of hypertension, depression, anxiety, restless leg syndrome, GERD, who presents emergency department for chief concerns of shortness of breath via POV.  Pt is currently intubated and sedation and unable to contribute to history, therefore history is obtained from chart review.   Per ED and nursing notes, she reported that she developed shortness of breath 3 to 4 days ago.  She denies known fever at home and trauma to her person.  She denies chest pain, abdominal pain, dysuria, hematuria.  She did have 1 episode of diarrhea today.  She denies blood in her stool.  She endorses having COVID about 4 weeks ago.   She denies recent international travel or long car trips.  She denies history of cancer that she knows of.  She denies recent surgery.   She denies changes to her leg pain.  She states at baseline she has leg pain because of MS and restless leg syndrome.  ED Course: Initial Vital Signs:  temperature of 99.1, respiration rate of 20, heart rate of 140, blood pressure 124/59, SPO2 of 94% on room air.  Significant Labs: Serum sodium is 138, potassium 2.7, chloride 106, bicarb 23, BUN of 14, serum creatinine 0.62, EGFR greater than 60, nonfasting blood glucose 106, WBC 15.7, hemoglobin 9.3, platelets of 231.  Imaging CTA PE: Was read as acute PE with moderate thrombus burden involving both lungs.   RV-LV ratio is 1.3 suggesting acute or chronic right heart dysfunction.  Small right pleural effusion.  Patchy infiltrates in the perihilar regions of both lung fields, right more than left.  Moderate to large size fixed hiatal hernia.  Bilateral lower extremity venous doppler ultrasounds>>  occlusive thrombus in both peroneal veins.  Medications Administered: Ceftriaxone 2 g IV, vancomycin, LR 150 mL bolus, doxycycline.   Hospitalist were asked to admit for further workup and treatment.  Vascular Surgery was consulted with plan for Thrombectomy.  Please see "significant hospital events" section below for full detailed hospital course.    Pertinent  Medical History   Past Medical History:  Diagnosis Date   GERD (gastroesophageal reflux disease)    Multiple sclerosis ([place])      Micro Data:  11/14: Blood culture x2>> 11/15: MRSA PCR>>negative  Antimicrobials:  Ceftriaxone 11/14>>11/14 Doxycycline 11/14>>11/14 Vancomycin 11/14>>11/14  Significant Hospital Events: Including procedures, antibiotic start and stop dates in addition to other pertinent events   11/14: Admitted by Hospitalist.  Vascular Surgery consulted 11/15:  Thrombectomy performed, massive amounts of clot removed.  Post procedure developed Acute Hemoptysis requiring emergent intubation and transfer to ICU.  PCCM consulted, plan for emergent Bronchoscopy 11/15 3 hour BRONCH to extract clots 11/16  2 units PRBC's to be given  Interim History / Subjective:  Remains in vent Severe hypoxia Blood transfusions Remains critically ill   Objective   Blood pressure 110/66, pulse (!) 110, temperature 99.7 F (37.6 C), temperature source Axillary, resp. rate 16, weight 68.4 kg, SpO2 100 %.    Vent Mode: PRVC FiO2 (%):  [  100 %] 100 % Set Rate:  [10 bmp-20 bmp] 20 bmp Vt Set:  [400 mL-450 mL] 400 mL PEEP:  [5 cmH20] 5 cmH20 Plateau Pressure:  [7 cmH20] 7 cmH20   Intake/Output Summary (Last 24 hours) at 02/26/2022  7124 Last data filed at 02/26/2022 0600 Gross per 24 hour  Intake 2283.53 ml  Output 700 ml  Net 1583.53 ml    Filed Weights   02/24/22 1710 02/25/22 0400  Weight: 57.2 kg 68.4 kg     REVIEW OF SYSTEMS  PATIENT IS UNABLE TO PROVIDE COMPLETE REVIEW OF SYSTEMS DUE TO SEVERE CRITICAL ILLNESS   PHYSICAL EXAMINATION:  GENERAL:critically ill appearing,  EYES: Pupils equal, round, reactive to light.  No scleral icterus.  MOUTH: Moist mucosal membrane. INTUBATED NECK: Supple.  PULMONARY: Lungs clear to auscultation, +rhonchi, +wheezing CARDIOVASCULAR: S1 and S2.  Regular rate and rhythm GASTROINTESTINAL: Soft, nontender, -distended. Positive bowel sounds.  MUSCULOSKELETAL: No swelling, clubbing, or edema.  NEUROLOGIC: obtunded,sedated SKIN:normal, warm to touch, Capillary refill delayed  Pulses present bilaterally  Resolved Hospital Problem list     Assessment & Plan:   Acute Hypoxic Respiratory Failure in setting of Bilateral Pulmonary Embolism with development of Hemoptysis leading to severe blockage of airways s/p bronch  therapeutic aspiration of blood clots from tracheobronchial tree   Severe ACUTE Hypoxic and Hypercapnic Respiratory Failure -continue Mechanical Ventilator support -Wean Fio2 and PEEP as tolerated -VAP/VENT bundle implementation - Wean PEEP & FiO2 as tolerated, maintain SpO2 > 88% - Head of bed elevated 30 degrees, VAP protocol in place - Plateau pressures less than 30 cm H20  - Intermittent chest x-ray & ABG PRN - Ensure adequate pulmonary hygiene  -will perform SAT/SBT when respiratory parameters are met Plan for repeat BRONCH this after noon to assess airways  Bilateral Pulmonary Embolism & Bilateral peroneal lower extremity thrombus, suspect in setting of recent COVID-19 infection about 3 weeks prior Will need to consider heparin infusion/AC BUT Hold Heparin for now  CARDIAC Echocardiogram 02/24/22: LVEF 58-09%, normal diastolic parameters, RV  size and systolic function normal -Continuous cardiac monitoring -Maintain MAP >65 -IV fluids -Vasopressors as needed to maintain MAP goal -HS Troponin negative -Will have to hold Heparin for now in setting of hemoptysis    NEUROLOGY ACUTE METABOLIC ENCEPHALOPATHY -Daily wake up assessment    ENDO - ICU hypoglycemic\Hyperglycemia protocol -check FSBS per protocol   GI GI PROPHYLAXIS as indicated  NUTRITIONAL STATUS DIET-->TF's as tolerated Constipation protocol as indicated   ELECTROLYTES -follow labs as needed -replace as needed -pharmacy consultation and following    Pt is critically ill.  High risk for decompensation, cardiac arrest, and death.   Best Practice (right click and "Reselect all SmartList Selections" daily)   Diet/type: NPO DVT prophylaxis: SCD GI prophylaxis: PPI Lines: N/A Foley:  N/A Code Status:  full code 11/15: Pt's husband updated at bedside.  Labs   CBC: Recent Labs  Lab 02/24/22 1541 02/25/22 0258 02/26/22 0046 02/26/22 0051 02/26/22 0307  WBC 15.7* 17.4*  --  26.3* 30.3*  HGB 9.3* 8.0* 7.2* 5.6* 6.6*  HCT 29.6* 25.2* 23.1* 18.1* 21.2*  MCV 91.6 94.4  --  93.8 93.0  PLT 231 206  --  182 187     Basic Metabolic Panel: Recent Labs  Lab 02/24/22 1541 02/25/22 0258 02/26/22 0051 02/26/22 0307  NA 138 139 140 139  K 3.7 3.6 4.1 4.1  CL 106 109 111 109  CO2 _0 GLUCOSE 106* 119* 104*  123*  BUN _0 CREATININE 0.62 0.73 0.72 0.68  CALCIUM 9.2 8.1* 7.1* 7.7*  MG  --   --  1.7 1.8  PHOS  --   --  2.6 3.0    GFR: Estimated Creatinine Clearance: 69.5 mL/min (by C-G formula based on SCr of 0.68 mg/dL). Recent Labs  Lab 02/24/22 1541 02/24/22 1854 02/24/22 2056 02/25/22 0258 02/26/22 0051 02/26/22 0307  PROCALCITON  --  0.23  --   --   --   --   WBC 15.7*  --   --  17.4* 26.3* 30.3*  LATICACIDVEN  --  1.9 1.0  --   --  1.5     Liver Function Tests: Recent Labs  Lab 02/26/22 0307  AST  17  ALT 17  ALKPHOS 96  BILITOT 1.3*  PROT 5.8*  ALBUMIN 2.5*   No results for input(s): "LIPASE", "AMYLASE" in the last 168 hours. No results for input(s): "AMMONIA" in the last 168 hours.  ABG    Component Value Date/Time   PHART 7.25 (L) 02/25/2022 2054   PCO2ART 57 (H) 02/25/2022 2054   PO2ART 89 02/25/2022 2054   HCO3 22.6 02/26/2022 0307   ACIDBASEDEF 4.0 (H) 02/26/2022 0307   O2SAT 59.3 02/26/2022 0307     Coagulation Profile: Home Medications  Prior to Admission medications   Medication Sig Start Date End Date Taking? Authorizing Provider  cyanocobalamin 1000 MCG tablet Take by mouth.   Yes [provider]  DULoxetine (CYMBALTA) 30 MG capsule Take 30 mg by mouth daily. 06/25/21  Yes [provider]  gabapentin (NEURONTIN) 400 MG capsule Take Gabapentin 400 mg in the morning, 400 mg in the afternoon, and 800 mg at night 12/29/21  Yes [provider]  omeprazole (PRILOSEC) 20 MG capsule Take 20 mg by mouth daily. 06/10/21  Yes [provider]  propranolol (INDERAL) 20 MG tablet Take 1 tablet by mouth 2 (two) times daily. 12/16/21  Yes [provider]  rOPINIRole (REQUIP) 0.25 MG tablet Take 0.25 mg at night for 1 week, then increase to 0.5 mg at night 05/29/21  Yes [provider]  tolterodine (DETROL LA) 2 MG 24 hr capsule Take 2 mg by mouth 2 (two) times daily. 12/29/21 12/29/22 Yes [provider]  ascorbic acid (VITAMIN C) 500 MG tablet Take by mouth.    [provider]  Cholecalciferol 50 MCG (2000 UT) CAPS Take by mouth.    [provider]  cyclobenzaprine (FLEXERIL) 10 MG tablet Take 10 mg by mouth 2 (two) times daily as needed. 11/26/21   [provider]  dalfampridine 10 MG TB12 Take 1 tablet by mouth every 12 (twelve) hours. 12/29/21   [provider]  meloxicam (MOBIC) 15 MG tablet Take 1 tablet (15 mg total) by mouth daily. 08/23/21 08/23/22  Cuthriell, Charline Bills, PA-C   methylPREDNISolone (MEDROL DOSEPAK) 4 MG TBPK tablet Take by mouth. 11/26/21   [provider]  naproxen (NAPROSYN) 500 MG tablet Take by mouth.    [provider]  nitrofurantoin, macrocrystal-monohydrate, (MACROBID) 100 MG capsule Take 1 capsule (100 mg total) by mouth 2 (two) times daily. 01/19/22   Immordino, Annie Main, FNP  pregabalin (LYRICA) 75 MG capsule Take 75 mg by mouth 3 (three) times daily. 08/18/21   [provider]  traMADol (ULTRAM) 50 MG tablet Take 50 mg by mouth 3 (three) times daily as needed. 12/26/21   [provider]      DVT/GI  PRX  assessed I Assessed the need for Labs I Assessed the need for Foley I Assessed the need for Central Venous Line Family Discussion when available I Assessed the need for Mobilization I made an Assessment of medications to be adjusted accordingly Safety Risk assessment completed  CASE DISCUSSED IN MULTIDISCIPLINARY ROUNDS WITH ICU TEAM     Critical Care Time devoted to patient care services described in this note is 54 minutes.  Critical care was necessary to treat /prevent imminent and life-threatening deterioration. Overall, patient is critically ill, prognosis is guarded.  Patient with Multiorgan failure and at high risk for cardiac arrest and death.    Corrin Parker, M.D.  [facility]  Medical Director [place] Director [facility] Cardio-Pulmonary Department

## 2022-02-26 NOTE — Progress Notes (Signed)
Initial Nutrition Assessment  DOCUMENTATION CODES:   Not applicable  INTERVENTION:   If tube feeds initiated, recommend:  Vital 1.5@50ml /hr- Initiate at 15ml/hr and increase by 45ml/hr q 8 hours until goal rate is reached.   ProSource TF 20- Give 84ml daily via tube, each supplement provides 80kcal and 20g of protein.   Free water flushes 68ml q4 hours   Regimen provides 1880kcal/day, 101g/day protein and 1222ml/day of free water.   Juven Fruit Punch BID via tube, each serving provides 95kcal and 2.5g of protein (amino acids glutamine and arginine)  Daily weights  NUTRITION DIAGNOSIS:   Inadequate oral intake related to inability to eat (pt sedated and ventilated) as evidenced by NPO status.  GOAL:   Provide needs based on ASPEN/SCCM guidelines  MONITOR:   Vent status, Labs, Weight trends, Skin, I & O's  REASON FOR ASSESSMENT:   Ventilator    ASSESSMENT:   61 y/o female with h/o MS, GERD and RLS who is admitted with bilateral PEs s/p thrombectomy 11/15.  Pt sedated and ventilated. Pt does not have enteral access. Pt s/p bronchoscopy today. Plan is for possible extubation later today. Will plan on OGT placement and tube feed initiation if pt does not extubate. Per chart, pt with weight gain pta.   Medications reviewed and include: B12, colace, pepcid, solu-medrol, protonix, miralax, albumin, unasyn, levophed, neosynephrine  Labs reviewed: K 4.1 wnl, P 3.0 wnl, Mg 1.8 wnl Wbc- 30.3(H), Hgb 6.9(L), Hct 21.4(L) Cbgs- 166, 149, 105 x 24 hrs  Patient is currently intubated on ventilator support MV: 9.2 L/min Temp (24hrs), Avg:100.1 F (37.8 C), Min:97.9 F (36.6 C), Max:102.7 F (39.3 C)  Propofol: none   MAP- >84mmHg   UOP-   NUTRITION - FOCUSED PHYSICAL EXAM:  Flowsheet Row Most Recent Value  Orbital Region No depletion  Upper Arm Region No depletion  Thoracic and Lumbar Region No depletion  Buccal Region No depletion  Temple Region No depletion   Clavicle Bone Region No depletion  Clavicle and Acromion Bone Region No depletion  Scapular Bone Region No depletion  Dorsal Hand No depletion  Patellar Region No depletion  Anterior Thigh Region No depletion  Posterior Calf Region No depletion  Edema (RD Assessment) Mild  Hair Reviewed  Eyes Reviewed  Mouth Reviewed  Skin Reviewed  Nails Reviewed   Diet Order:   Diet Order             Diet NPO time specified  Diet effective now                  EDUCATION NEEDS:   No education needs have been identified at this time  Skin:  Skin Assessment: Reviewed RN Assessment (right foot- 1.5cm x 1.0cm x 0.1cm)  Last BM:  11/14  Height:   Ht Readings from Last 1 Encounters:  02/26/22 5\' 2"  (1.575 m)    Weight:   Wt Readings from Last 1 Encounters:  02/25/22 68.4 kg    Ideal Body Weight:  50 kg  BMI:  Body mass index is 27.58 kg/m.  Estimated Nutritional Needs:   Kcal:  1808kcal/day  Protein:  90-100g/day  Fluid:  1.5-1.8L/day  02/27/22 MS, RD, LDN Please refer to [facility] for RD and/or RD on-call/weekend/after hours pager

## 2022-02-26 NOTE — Progress Notes (Signed)
An USGPIV (ultrasound guided PIV) has been placed for short-term vasopressor infusion. A correctly placed ivWatch must be used when administering Vasopressors. Should this treatment be needed beyond 72 hours, central line access should be obtained.  It will be the responsibility of the bedside nurse to follow best practice to prevent extravasations.   ?

## 2022-02-26 NOTE — Progress Notes (Signed)
FIO2 decreased to 40% by Dr Belia Heman

## 2022-02-26 NOTE — Progress Notes (Signed)
RT assisted with bedside bronchoscopy. Therapeutic brochoscope used. Informed consent and time out obtained and performed prior to pocedure.

## 2022-02-26 NOTE — Progress Notes (Signed)
[facility]  Daily Progress Note   Subjective  -POD #1 pulmonary thrombectomy.  [name] 61 year old female underwent a pulmonary thrombectomy yesterday afternoon.  Patient did well and was transferred back to the ICU.  Shortly afterwards patient developed hemoptysis upon coughing.  She went into respiratory distress and became unresponsive.  Critical team was consulted patient then was intubated.  Placed on a ventilator on PRVC.  Patient then underwent bronchoscopy at the bedside.  No complications to note.  This morning patient remains on the ventilator.  Weaning is in process.  Plan is possibly to extubate later today if patient does well.  Patient remains on Precedex for sedation.  This is being weaned as well in anticipation for extubation.  Informed by the team that there was no other hemoptysis overnight.  Vitals all remained stable.  We will continue to follow and monitor.  Objective Vitals:   02/26/22 0745 02/26/22 0800 02/26/22 0809 02/26/22 0900  BP: (!) 92/58 (!) 92/58 (!) 92/58 (!) 91/57  Pulse: (!) 110 (!) 109 (!) 109 (!) 104  Resp: 18 13 13 18   Temp:   99.8 F (37.7 C)   TempSrc:   Axillary   SpO2: 95% 95% 96% 97%  Weight:        Intake/Output Summary (Last 24 hours) at 02/26/2022 0953 Last data filed at 02/26/2022 0930 Gross per 24 hour  Intake 2918.72 ml  Output 750 ml  Net 2168.72 ml    PULM  CTAB CV  RRR VASC  patient's groin insertion site is without hematoma seroma or infection.  Patient has palpable pulses bilaterally in lower and upper extremities.   Laboratory CBC    Component Value Date/Time   WBC 30.3 (H) 02/26/2022 0307   HGB 6.6 (L) 02/26/2022 0307   HCT 21.2 (L) 02/26/2022 0307   PLT 187 02/26/2022 0307    BMET    Component Value Date/Time   NA 139 02/26/2022 0307   K 4.1 02/26/2022 0307   CL 109 02/26/2022 0307   CO2 22 02/26/2022 0307   GLUCOSE 123 (H) 02/26/2022 0307   BUN 14 02/26/2022  0307   CREATININE 0.68 02/26/2022 0307   CALCIUM 7.7 (L) 02/26/2022 0307   GFRNONAA >60 02/26/2022 0307    Assessment/Planning: POD #1 s/p Pulmonary Thombectomy  Patient is currently on a ventilator and is ventilating well.  No signs or symptoms of hypoxemia post hemoptysis yesterday afternoon.  Plan according to the critical team is to wean ventilator and sedation and possibly extubate this afternoon.  We will continue to follow and monitor.  Insertion site in groin area is without hematoma or seroma or infection this morning.  Patient has palpable pulses throughout.  Continue to follow and monitor.   02/28/2022  02/26/2022, 9:53 AM

## 2022-02-26 NOTE — Procedures (Signed)
  PROCEDURE: BRONCHOSCOPY Therapeutic Aspiration of Tracheobronchial Tree  PROCEDURE DATE: 02/26/2022  TIME:  [name]  DOB:08/26/1960  MRN: [id number] LOC:  IC17A/IC17A-AA    HOSP DAY: @LENGTHOFSTAYDAYS @ CODE STATUS:      Code Status Orders  (From admission, onward)           Start     Ordered   02/24/22 1725  Full code  Continuous        02/24/22 1726           Code Status History     This patient has a current code status but no historical code status.           Indications/Preliminary Diagnosis: THERAPEUTIC ASPIRATION OF TRACHEOBRONCHIAL TREE  Consent: (Place X beside choice/s below)  The benefits, risks and possible complications of the procedure were        explained to:  ___ patient  _x__ patient's family  ___ other:___________  who verbalized understanding and gave:  ___ verbal  ___ written  __x_ verbal and written  ___ telephone  ___ other:________ consent.      Unable to obtain consent; procedure performed on emergent basis.     Other:       PRESEDATION ASSESSMENT: History and Physical has been performed. Patient meds and allergies have been reviewed. Presedation airway examination has been performed and documented. Baseline vital signs, sedation score, oxygenation status, and cardiac rhythm were reviewed. Patient was deemed to be in satisfactory condition to undergo the procedure.    PREMEDICATIONS:   Sedative/Narcotic Amt Dose   Versed 4 mg   Fentanyl  mcg  Diprivan  mg            PROCEDURE DETAILS: Timeout performed and correct patient, name, & ID confirmed. Following prep per Pulmonary policy, appropriate sedation was administered. The Bronchoscope was inserted in to oral cavity with bite block in place. Therapeutic aspiration of Tracheobronchial tree was performed.  Airway exam proceeded with findings, technical procedures, and specimen collection as noted below. At the end of exam the scope was withdrawn without  incident. Impression and Plan as noted below.         Insertion Route (Place X beside choice below)   Nasal   Oral  x Endotracheal Tube   Tracheostomy   INTRAPROCEDURE MEDICATIONS:  Sedative/Narcotic Amt Dose   Versed  mg   Fentanyl 200 mcg  Diprivan  mg        TECHNICAL PROCEDURES: (Place X beside choice below)   Procedures  Description    None     Electrocautery     Cryotherapy     Balloon Dilatation     Bronchography     Stent Placement   x  Therapeutic Aspiration BLOOD CLOTS    Laser/Argon Plasma    Brachytherapy Catheter Placement    Foreign Body Removal          FINDINGS: LARGE BLOOD CLOTS EXTRACTED FROM APICAL SEGMENT OF LUL USING FORCEPS      ESTIMATED BLOOD LOSS: none COMPLICATIONS/RESOLUTION: none      IMPRESSION:POST-PROCEDURE DX:  B/L PE WITH MASSIVE HEMOPTYSIS     RECOMMENDATION/PLAN:   PLAN FOR TRIAL OF SAT/SBT WILL NEED TO CONSIDER AC THERAPY   02/26/22, M.D.  [facility]  Medical Director [facility] Medical Director [facility] Cardio-Pulmonary Department

## 2022-02-27 ENCOUNTER — Inpatient Hospital Stay: Payer: Medicare Other

## 2022-02-27 DIAGNOSIS — I2699 Other pulmonary embolism without acute cor pulmonale: Secondary | ICD-10-CM | POA: Diagnosis not present

## 2022-02-27 LAB — TYPE AND SCREEN
ABO/RH(D): O POS
Antibody Screen: NEGATIVE
Unit division: 0
Unit division: 0

## 2022-02-27 LAB — PHOSPHORUS: Phosphorus: 1.8 mg/dL — ABNORMAL LOW (ref 2.5–4.6)

## 2022-02-27 LAB — BPAM RBC
Blood Product Expiration Date: 202312152359
Blood Product Expiration Date: 202312202359
ISSUE DATE / TIME: 202311160353
ISSUE DATE / TIME: 202311161132
Unit Type and Rh: 5100
Unit Type and Rh: 5100

## 2022-02-27 LAB — CBC WITH DIFFERENTIAL/PLATELET
Abs Immature Granulocytes: 1.06 10*3/uL — ABNORMAL HIGH (ref 0.00–0.07)
Basophils Absolute: 0 10*3/uL (ref 0.0–0.1)
Basophils Relative: 0 %
Eosinophils Absolute: 0 10*3/uL (ref 0.0–0.5)
Eosinophils Relative: 0 %
HCT: 25.6 % — ABNORMAL LOW (ref 36.0–46.0)
Hemoglobin: 8.3 g/dL — ABNORMAL LOW (ref 12.0–15.0)
Immature Granulocytes: 6 %
Lymphocytes Relative: 2 %
Lymphs Abs: 0.3 10*3/uL — ABNORMAL LOW (ref 0.7–4.0)
MCH: 28.9 pg (ref 26.0–34.0)
MCHC: 32.4 g/dL (ref 30.0–36.0)
MCV: 89.2 fL (ref 80.0–100.0)
Monocytes Absolute: 2 10*3/uL — ABNORMAL HIGH (ref 0.1–1.0)
Monocytes Relative: 11 %
Neutro Abs: 14.1 10*3/uL — ABNORMAL HIGH (ref 1.7–7.7)
Neutrophils Relative %: 81 %
Platelets: 145 10*3/uL — ABNORMAL LOW (ref 150–400)
RBC: 2.87 MIL/uL — ABNORMAL LOW (ref 3.87–5.11)
RDW: 17.9 % — ABNORMAL HIGH (ref 11.5–15.5)
Smear Review: NORMAL
WBC: 17.5 10*3/uL — ABNORMAL HIGH (ref 4.0–10.5)
nRBC: 0.1 % (ref 0.0–0.2)

## 2022-02-27 LAB — GLUCOSE, CAPILLARY
Glucose-Capillary: 121 mg/dL — ABNORMAL HIGH (ref 70–99)
Glucose-Capillary: 128 mg/dL — ABNORMAL HIGH (ref 70–99)
Glucose-Capillary: 141 mg/dL — ABNORMAL HIGH (ref 70–99)
Glucose-Capillary: 149 mg/dL — ABNORMAL HIGH (ref 70–99)
Glucose-Capillary: 156 mg/dL — ABNORMAL HIGH (ref 70–99)
Glucose-Capillary: 157 mg/dL — ABNORMAL HIGH (ref 70–99)
Glucose-Capillary: 181 mg/dL — ABNORMAL HIGH (ref 70–99)

## 2022-02-27 LAB — BASIC METABOLIC PANEL
Anion gap: 7 (ref 5–15)
BUN: 14 mg/dL (ref 6–20)
CO2: 26 mmol/L (ref 22–32)
Calcium: 8.7 mg/dL — ABNORMAL LOW (ref 8.9–10.3)
Chloride: 111 mmol/L (ref 98–111)
Creatinine, Ser: 0.5 mg/dL (ref 0.44–1.00)
GFR, Estimated: >60 mL/min (ref >60)
Glucose, Bld: 156 mg/dL — ABNORMAL HIGH (ref 70–99)
Potassium: 3.9 mmol/L (ref 3.5–5.1)
Sodium: 144 mmol/L (ref 135–145)

## 2022-02-27 LAB — HEPARIN LEVEL (UNFRACTIONATED): Heparin Unfractionated: 0.21 IU/mL — ABNORMAL LOW (ref 0.30–0.70)

## 2022-02-27 LAB — MAGNESIUM: Magnesium: 2.5 mg/dL — ABNORMAL HIGH (ref 1.7–2.4)

## 2022-02-27 LAB — BRAIN NATRIURETIC PEPTIDE: B Natriuretic Peptide: 350.9 pg/mL — ABNORMAL HIGH (ref 0.0–100.0)

## 2022-02-27 MED ORDER — TRAMADOL HCL 50 MG PO TABS: 50.0000 mg | ORAL_TABLET | Freq: Three times a day (TID) | ORAL | Status: DC | PRN

## 2022-02-27 MED ORDER — ACETAMINOPHEN 650 MG RE SUPP: 650.0000 mg | Freq: Four times a day (QID) | RECTAL | Status: AC | PRN

## 2022-02-27 MED ORDER — VITAMIN B-12 1000 MCG PO TABS
1000.0000 ug | ORAL_TABLET | Freq: Every day | ORAL | Status: DC
  Administered 2022-02-28 – 2022-03-20 (×21): 1000 ug
  Filled 2022-02-27 (×21): qty 1

## 2022-02-27 MED ORDER — FREE WATER
50.0000 mL | Status: DC
  Administered 2022-02-27 – 2022-03-02 (×16): 50 mL

## 2022-02-27 MED ORDER — FUROSEMIDE 10 MG/ML IJ SOLN: 40.0000 mg | Freq: Once | INTRAMUSCULAR | Status: AC

## 2022-02-27 MED ORDER — JUVEN PO PACK
1.0000 | PACK | Freq: Two times a day (BID) | ORAL | Status: DC
  Administered 2022-02-28 – 2022-03-20 (×41): 1

## 2022-02-27 MED ORDER — FUROSEMIDE 10 MG/ML IJ SOLN
INTRAMUSCULAR | Status: AC
  Administered 2022-02-27: 40 mg via INTRAVENOUS
  Filled 2022-02-27: qty 4

## 2022-02-27 MED ORDER — ORAL CARE MOUTH RINSE
15.0000 mL | OROMUCOSAL | Status: DC | PRN
  Administered 2022-03-13 – 2022-03-14 (×2): 15 mL via OROMUCOSAL

## 2022-02-27 MED ORDER — PROSOURCE TF20 ENFIT COMPATIBL EN LIQD
60.0000 mL | Freq: Every day | ENTERAL | Status: DC
  Administered 2022-02-28 – 2022-03-20 (×21): 60 mL

## 2022-02-27 MED ORDER — STERILE WATER FOR INJECTION IJ SOLN
INTRAMUSCULAR | Status: AC
  Administered 2022-02-27: 10 mL
  Filled 2022-02-27: qty 10

## 2022-02-27 MED ORDER — HEPARIN (PORCINE) 25000 UT/250ML-% IV SOLN
1450.0000 [IU]/h | INTRAVENOUS | Status: DC
  Administered 2022-02-27: 1150 [IU]/h via INTRAVENOUS
  Administered 2022-02-28: 1350 [IU]/h via INTRAVENOUS
  Filled 2022-02-27 (×3): qty 250

## 2022-02-27 MED ORDER — VECURONIUM BROMIDE 10 MG IV SOLR
INTRAVENOUS | Status: AC
  Filled 2022-02-27: qty 10

## 2022-02-27 MED ORDER — POTASSIUM PHOSPHATES 15 MMOLE/5ML IV SOLN
15.0000 mmol | Freq: Once | INTRAVENOUS | Status: AC
  Administered 2022-02-27: 15 mmol via INTRAVENOUS
  Filled 2022-02-27: qty 5

## 2022-02-27 MED ORDER — HEPARIN (PORCINE) 25000 UT/250ML-% IV SOLN: 1150.0000 [IU]/h | INTRAVENOUS | Status: DC

## 2022-02-27 MED ORDER — VITAL 1.5 CAL PO LIQD
1000.0000 mL | ORAL | Status: DC
  Administered 2022-02-27 – 2022-03-12 (×12): 1000 mL

## 2022-02-27 MED ORDER — HYDROXYZINE HCL 25 MG PO TABS: 25.0000 mg | ORAL_TABLET | Freq: Three times a day (TID) | ORAL | Status: DC | PRN

## 2022-02-27 MED ORDER — ACETAMINOPHEN 325 MG PO TABS: 650.0000 mg | ORAL_TABLET | Freq: Four times a day (QID) | ORAL | Status: AC | PRN

## 2022-02-27 MED ORDER — PROPRANOLOL HCL 20 MG PO TABS
20.0000 mg | ORAL_TABLET | Freq: Two times a day (BID) | ORAL | Status: DC
  Administered 2022-02-27 – 2022-03-02 (×6): 20 mg
  Filled 2022-02-27 (×6): qty 1

## 2022-02-27 MED ORDER — PREGABALIN 75 MG PO CAPS
75.0000 mg | ORAL_CAPSULE | Freq: Three times a day (TID) | ORAL | Status: DC
  Administered 2022-02-27 – 2022-03-07 (×24): 75 mg
  Filled 2022-02-27 (×24): qty 1

## 2022-02-27 MED ORDER — FUROSEMIDE 10 MG/ML IJ SOLN: 20.0000 mg | Freq: Once | INTRAMUSCULAR | Status: DC

## 2022-02-27 MED ORDER — ORAL CARE MOUTH RINSE
15.0000 mL | OROMUCOSAL | Status: DC
  Administered 2022-02-27 – 2022-03-15 (×190): 15 mL via OROMUCOSAL

## 2022-02-27 MED ORDER — VECURONIUM BROMIDE 10 MG IV SOLR: 10.0000 mg | INTRAVENOUS | Status: DC | PRN

## 2022-02-27 NOTE — Progress Notes (Signed)
[facility]  Daily Progress Note   Subjective  - POD 2  Pulmonary Thrombectomy.   [name] 61 year old female underwent a pulmonary thrombectomy yesterday afternoon.  Patient did well and was transferred back to the ICU.  Shortly afterwards patient developed hemoptysis upon coughing.  She went into respiratory distress and became unresponsive.  Critical team was consulted patient then was intubated.  Placed on a ventilator on PRVC.  Patient then underwent bronchoscopy at the bedside.  No complications to note.   This morning patient remains on the ventilator.  Weaning is in process.  Plan is possibly to extubate later today if patient does well.  Patient remains on Precedex for sedation.  This is being weaned as well in anticipation for extubation.  Informed by the team that there was no other hemoptysis overnight.  Vitals all remained stable.  Yesterday afternoon's bronchoscopy was negative for any noted hemoptysis or thrombus.  We will continue to follow and monitor    Objective Vitals:   02/27/22 0545 02/27/22 0600 02/27/22 0700 02/27/22 0800  BP: 113/71 112/64 112/66 100/60  Pulse: (!) 108 (!) 107 (!) 105 (!) 108  Resp: 20 16 19 20   Temp:    98.1 F (36.7 C)  TempSrc:    Axillary  SpO2: 95% 95% 95% 91%  Weight:      Height:        Intake/Output Summary (Last 24 hours) at 02/27/2022 1026 Last data filed at 02/27/2022 0800 Gross per 24 hour  Intake 1858.16 ml  Output 470 ml  Net 1388.16 ml    PULM  CTAB CV  RRR VASC  patient's groin insertion site is without hematoma seroma or infection.  Patient has palpable pulses bilaterally in lower and upper extremities.   Laboratory CBC    Component Value Date/Time   WBC 17.5 (H) 02/27/2022 0421   HGB 8.3 (L) 02/27/2022 0421   HCT 25.6 (L) 02/27/2022 0421   PLT 145 (L) 02/27/2022 0421    BMET    Component Value Date/Time   NA 144 02/27/2022 0421   K 3.9 02/27/2022 0421   CL 111  02/27/2022 0421   CO2 26 02/27/2022 0421   GLUCOSE 156 (H) 02/27/2022 0421   BUN 14 02/27/2022 0421   CREATININE 0.50 02/27/2022 0421   CALCIUM 8.7 (L) 02/27/2022 0421   GFRNONAA >60 02/27/2022 0421    Assessment/Planning: POD #2 s/p 03/01/2022 thrombectomy  Patient is currently on a ventilator and is ventilating well.  No signs or symptoms of hypoxemia post hemoptysis post procedure. Plan according to the critical team is to wean ventilator and sedation and possibly extubate this afternoon.  We will continue to follow and monitor.   Insertion site in groin area is without hematoma or seroma or infection this morning.  Patient has palpable pulses throughout.  Continue to follow and monitor.   Vidal Schwalbe  02/27/2022, 10:26 AM

## 2022-02-27 NOTE — Progress Notes (Signed)
Nutrition Follow Up Note   DOCUMENTATION CODES:   Not applicable  INTERVENTION:   Vital 1.5_0 /hr- Initiate at 27m/hr and increase by 193mhr q 8 hours until goal rate is reached.   ProSource TF 20- Give 6019maily via tube, each supplement provides 80kcal and 20g of protein.   Free water flushes 76m25m hours   Regimen provides 1880kcal/day, 101g/day protein and 1217ml60m of free water.   Juven Fruit Punch BID via tube, each serving provides 95kcal and 2.5g of protein (amino acids glutamine and arginine)  Daily weights  NUTRITION DIAGNOSIS:   Inadequate oral intake related to inability to eat (pt sedated and ventilated) as evidenced by NPO status.  GOAL:   Provide needs based on ASPEN/SCCM guidelines -not met   MONITOR:   Vent status, Labs, Weight trends, Skin, I & O's  ASSESSMENT:   60 y/54male with h/o MS, GERD and RLS who is admitted with bilateral PEs s/p thrombectomy 11/15.  Pt remains sedated and ventilated. OGT in place. Will plan to initiate tube feeds today. Pt remains at high refeed risk. Per chart, pt is documented to be up ~24lbs from her UBW; RD unsure if bed weights are correct. Pt +5.7L/day on her I & Os.   Medications reviewed and include: B12, colace, pepcid, insulin, solu-medrol, protonix, miralax, unasyn, Kphos  Labs reviewed: K 3.9 wnl, P 1.8(L), Mg 2.5(H) Wbc- 17.5(H), Hgb 8.3(L), Hct 25.6(L) Cbgs- 157, 121, 149, 181 x 24 hrs  Patient is currently intubated on ventilator support MV: 8.1 L/min Temp (24hrs), Avg:98.8 F (37.1 C), Min:98.1 F (36.7 C), Max:99.3 F (37.4 C)  Propofol: none   MAP- >65mmH81mUOP- 570ml  75mt Order:   Diet Order             Diet NPO time specified  Diet effective now                  EDUCATION NEEDS:   No education needs have been identified at this time  Skin:  Skin Assessment: Reviewed RN Assessment (right foot- 1.5cm x 1.0cm x 0.1cm)  Last BM:  11/14  Height:   Ht Readings from  Last 1 Encounters:  02/26/22 _1  (1.575 m)    Weight:   Wt Readings from Last 1 Encounters:  02/25/22 68.4 kg    Ideal Body Weight:  50 kg  BMI:  Body mass index is 27.58 kg/m.  Estimated Nutritional Needs:   Kcal:  1808kcal/day  Protein:  90-100g/day  Fluid:  1.5-1.8L/day  Xylan Sheils CKoleen Distance, LDN Please [facility] and/or RD on-call/weekend/after hours pager

## 2022-02-27 NOTE — Consult Note (Signed)
ANTICOAGULATION CONSULT NOTE  Pharmacy Consult for heparin drip Indication: pulmonary embolus  Allergies  Allergen Reactions   Erythromycin Hives and Nausea And Vomiting   Lexapro [Escitalopram Oxalate] Hives    Patient Measurements: Height: 5\' 2"  (157.5 cm) Weight: 68.4 kg (150 lb 12.7 oz) IBW/kg (Calculated) : 50.1 Heparin Dosing Weight: 57.2 kg  Vital Signs: Temp: 98.9 F (37.2 C) (11/17 2343) Temp Source: Axillary (11/17 2343) BP: 98/56 (11/17 2100) Pulse Rate: 107 (11/17 2100)  Labs: Recent Labs    02/25/22 1112 02/25/22 1924 02/26/22 0046 02/26/22 0051 02/26/22 0307 02/26/22 1014 02/26/22 1512 02/27/22 0421 02/27/22 2311  HGB  --   --    < > 5.6* 6.6* 6.9* 8.4* 8.3*  --   HCT  --   --    < > 18.1* 21.2* 21.4* 25.2* 25.6*  --   PLT  --   --   --  182 187  --   --  145*  --   HEPARINUNFRC 0.37 <0.10*  --   --   --   --   --   --  0.21*  CREATININE  --   --   --  0.72 0.68  --   --  0.50  --    < > = values in this interval not displayed.     Estimated Creatinine Clearance: 69.5 mL/min (by C-G formula based on SCr of 0.5 mg/dL).   Medical History: Past Medical History:  Diagnosis Date   GERD (gastroesophageal reflux disease)    Multiple sclerosis (HCC)     Medications:  No home anticoagulation per pharmacist review.  Assessment: 61 yo female presented to ED with SOB when lying flat and some right sided rib and shoulder pain.  Chest CT showed acute PE.  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 Heparin was held in setting of hemoptysis which has resolved  Goal of Therapy:  Heparin level 0.3-0.7 units/ml Monitor platelets by anticoagulation protocol: Yes   11/17 2311 HL 0.21, subtherapeutic  Plan:  --Increase heparin  infusion to 1250 units/hr (no bolus per MD order) --Recheck HL w/ AM labs --CBC daily while on heparin  12/17, PharmD, [facility] 02/27/2022 11:56 PM

## 2022-02-27 NOTE — Progress Notes (Signed)
PHARMACY CONSULT NOTE  Pharmacy Consult for Electrolyte Monitoring and Replacement   Recent Labs: Potassium (mmol/L)  Date Value  02/27/2022 3.9   Magnesium (mg/dL)  Date Value  94/49/6759 2.5 (H)   Calcium (mg/dL)  Date Value  16/38/4665 8.7 (L)   Albumin (g/dL)  Date Value  99/35/7017 2.5 (L)   Phosphorus (mg/dL)  Date Value  79/39/0300 1.8 (L)   Sodium (mmol/L)  Date Value  02/27/2022 144     Assessment: 61 yo female presented to ED with SOB when lying flat and some right sided rib and shoulder pain.  Chest CT showed acute PE.  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 Pharmacy is asked to follow and replace electrolytes while in CCU  Goal of Therapy:  Electrolytes WNL  Plan:  15 mmol IV k phos (contains 22 mEq IV potassium) recheck electrolytes in am  Lowella Bandy ,PharmD Clinical Pharmacist 02/27/2022 7:13 AM

## 2022-02-27 NOTE — Consult Note (Addendum)
ANTICOAGULATION CONSULT NOTE  Pharmacy Consult for heparin drip Indication: pulmonary embolus  Allergies  Allergen Reactions   Erythromycin Hives and Nausea And Vomiting   Lexapro [Escitalopram Oxalate] Hives    Patient Measurements: Height: 5\' 2"  (157.5 cm) Weight: 68.4 kg (150 lb 12.7 oz) IBW/kg (Calculated) : 50.1 Heparin Dosing Weight: 57.2 kg  Vital Signs: Temp: 98.4 F (36.9 C) (11/17 1200) Temp Source: Axillary (11/17 1200) BP: 106/47 (11/17 1200) Pulse Rate: 139 (11/17 1200)  Labs: Recent Labs    02/24/22 1542 02/24/22 1854 02/25/22 0258 02/25/22 1112 02/25/22 1924 02/26/22 0046 02/26/22 0051 02/26/22 0307 02/26/22 1014 02/26/22 1512 02/27/22 0421  HGB  --   --  8.0*  --   --    < > 5.6* 6.6* 6.9* 8.4* 8.3*  HCT  --   --  25.2*  --   --    < > 18.1* 21.2* 21.4* 25.2* 25.6*  PLT  --   --  206  --   --   --  182 187  --   --  145*  APTT  --  47*  --   --   --   --   --   --   --   --   --   LABPROT  --  15.6*  --   --   --   --   --   --   --   --   --   INR  --  1.3*  --   --   --   --   --   --   --   --   --   HEPARINUNFRC  --   --  0.15* 0.37 <0.10*  --   --   --   --   --   --   CREATININE  --   --  0.73  --   --   --  0.72 0.68  --   --  0.50  TROPONINIHS 9  --   --   --   --   --   --   --   --   --   --    < > = values in this interval not displayed.     Estimated Creatinine Clearance: 69.5 mL/min (by C-G formula based on SCr of 0.5 mg/dL).   Medical History: Past Medical History:  Diagnosis Date   GERD (gastroesophageal reflux disease)    Multiple sclerosis (HCC)     Medications:  No home anticoagulation per pharmacist review.  Assessment: 61 yo female presented to ED with SOB when lying flat and some right sided rib and shoulder pain.  Chest CT showed acute PE.  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 Heparin was held in setting of hemoptysis which has resolved  Goal of Therapy:  Heparin level 0.3-0.7 units/ml Monitor platelets by  anticoagulation protocol: Yes    Plan:  --restart heparin  infusion at 1150 units/hr (no bolus per MD order) --check heparin level in 6 hours after restarting --CBC daily while on heparin  41, PharmD, BCPS 02/27/2022 2:59 PM

## 2022-02-27 NOTE — Progress Notes (Signed)
NAME:  [name], MRN:  [id number], DOB:  08/10/1960, LOS: 3 ADMISSION DATE:  02/24/2022, CONSULTATION DATE:  02/25/2022 REFERRING MD:  Dr. Lucky Cowboy, CHIEF COMPLAINT:  Acute Respiratory Distress & hypoxia, development of Hemoptysis post thrombectomy  Brief Pt Description / Synopsis:  61 y.o. female admitted with Acute Hypoxic Respiratory Failure in setting of Bilateral Pulmonary Embolism.  Underwent thrombectomy with Vascular Surgery, post procedure developed Hemoptysis leading to severe blockage of airways requiring emergent intubation. S/p bronch  therapeutic aspiration of blood clots from tracheobronchial tree.  History of Present Illness:  [name] is a 61 year old female with history of hypertension, depression, anxiety, restless leg syndrome, GERD, who presents emergency department for chief concerns of shortness of breath via POV.  Pt is currently intubated and sedation and unable to contribute to history, therefore history is obtained from chart review.   Per ED and nursing notes, she reported that she developed shortness of breath 3 to 4 days ago.  She denies known fever at home and trauma to her person.  She denies chest pain, abdominal pain, dysuria, hematuria.  She did have 1 episode of diarrhea today.  She denies blood in her stool.  She endorses having COVID about 4 weeks ago.   She denies recent international travel or long car trips.  She denies history of cancer that she knows of.  She denies recent surgery.   She denies changes to her leg pain.  She states at baseline she has leg pain because of MS and restless leg syndrome.  ED Course: Initial Vital Signs:  temperature of 99.1, respiration rate of 20, heart rate of 140, blood pressure 124/59, SPO2 of 94% on room air.  Significant Labs: Serum sodium is 138, potassium 2.7, chloride 106, bicarb 23, BUN of 14, serum creatinine 0.62, EGFR greater than 60, nonfasting blood glucose 106, WBC 15.7, hemoglobin  9.3, platelets of 231.  Imaging CTA PE: Was read as acute PE with moderate thrombus burden involving both lungs.  RV-LV ratio is 1.3 suggesting acute or chronic right heart dysfunction.  Small right pleural effusion.  Patchy infiltrates in the perihilar regions of both lung fields, right more than left.  Moderate to large size fixed hiatal hernia.  Bilateral lower extremity venous doppler ultrasounds>>  occlusive thrombus in both peroneal veins.  Medications Administered: Ceftriaxone 2 g IV, vancomycin, LR 150 mL bolus, doxycycline.   Hospitalist were asked to admit for further workup and treatment.  Vascular Surgery was consulted with plan for Thrombectomy.  Please see "significant hospital events" section below for full detailed hospital course.    Pertinent  Medical History   Past Medical History:  Diagnosis Date   GERD (gastroesophageal reflux disease)    Multiple sclerosis ([place])      Micro Data:  11/14: Blood culture x2>>NGTD 11/15: MRSA PCR>>negative  Antimicrobials:  Ceftriaxone 11/14>>11/14 Doxycycline 11/14>>11/14 Vancomycin 11/14>>11/14 Unasyn 11/15>>  Significant Hospital Events: Including procedures, antibiotic start and stop dates in addition to other pertinent events   11/14: Admitted by Hospitalist.  Vascular Surgery consulted 11/15:  Thrombectomy performed, massive amounts of clot removed.  Post procedure developed Acute Hemoptysis requiring emergent intubation and transfer to ICU.  PCCM consulted, plan for emergent Bronchoscopy 11/15 3 hour BRONCH to extract clots 11/16  2 units PRBC's to be given.  Repeat Bronch. 11/17: Off pressors. Increasing FiO2 requirements (60% FiO2, 5 peep), CXR with increase in bilateral opacities concerning for PNA.  NO SBT today. Place OG and start feeds.  Interim  History / Subjective:  -No acute events noted overnight -Afebrile, hemodynamically stable, pressors weaned off -Increase in FiO2 to 60%, 5 PEEP; CXR with worsening  bilateral opacities concerning for PNA ~ no SBT today -Hgb stable at 8.3 from 8.4 s/p 2 units pRBC's yesterday -Leukocytosis down to 17.5 from 30.3 -Nursing & respiratory reports no secretions from ETT to be able to obtain TA  Objective   Blood pressure 112/64, pulse (!) 107, temperature 98.5 F (36.9 C), temperature source Axillary, resp. rate 16, height 5' 2" (1.575 m), weight 68.4 kg, SpO2 95 %.    Vent Mode: PRVC FiO2 (%):  [40 %] 40 % Set Rate:  [20 bmp] 20 bmp Vt Set:  [400 mL] 400 mL PEEP:  [5 cmH20] 5 cmH20 Plateau Pressure:  [12 cmH20-25 cmH20] 16 cmH20   Intake/Output Summary (Last 24 hours) at 02/27/2022 0819 Last data filed at 02/27/2022 0600 Gross per 24 hour  Intake 1946.9 ml  Output 520 ml  Net 1426.9 ml    Filed Weights   02/24/22 1710 02/25/22 0400  Weight: 57.2 kg 68.4 kg     REVIEW OF SYSTEMS PATIENT IS UNABLE TO PROVIDE COMPLETE REVIEW OF SYSTEMS DUE TO SEVERE CRITICAL ILLNESS   PHYSICAL EXAMINATION:  GENERAL:critically ill appearing female, laying in bed, intubated and lightly sedated, NAD  EYES: Pupils equal, round, reactive to light.  No scleral icterus.  MOUTH: Moist mucosal membrane. Orally INTUBATED NECK: Supple. No JVD PULMONARY: Coarse to auscultation throughout, even, occasionally overbreathing the vent CARDIOVASCULAR: S1 and S2.  Tachycardia, Regular rhythm GASTROINTESTINAL: Soft, nontender, -distended. Positive bowel sounds.  MUSCULOSKELETAL: No swelling, clubbing, or edema.  NEUROLOGIC: Lightly sedated, arouses to voice and follows/MAE to commands SKIN:normal, warm to touch,  Pulses present bilaterally  Resolved Hospital Problem list     Assessment & Plan:    Severe ACUTE Hypoxic Respiratory Failure in setting of Bilateral Pulmonary Embolism, Hemoptysis, and presumed Aspiration  S/p Bronchoscopy x2 -Full vent support, implement lung protective strategies -Plateau pressures less than 30 cm H20 -Wean FiO2 & PEEP as tolerated to  maintain O2 sats >92% -Follow intermittent Chest X-ray & ABG as needed -Spontaneous Breathing Trials when respiratory parameters met and mental status permits -Implement VAP Bundle -Bronchodilators & Pulmicort nebs -IV Steroids -ABX as above -Aggressive pulmonary toilet as able  Bilateral Pulmonary Embolism & Bilateral peroneal lower extremity thrombus, suspect in setting of recent COVID-19 infection about 3 weeks prior Hypotension: Related to sedation +/- acute blood loss with hemoptysis +/- sepsis from aspiration/pneumonia Echocardiogram 02/24/22: LVEF 47-65%, normal diastolic parameters, RV size and systolic function normal S/p Thrombectomy 11/15 -Continuous cardiac monitoring -Maintain MAP >65 -IV fluids -Vasopressors as needed to maintain MAP goal -Trend lactic acid until normalized -HS Troponin negative -Discussed with Dr. Dillard Cannon, plan to restart Heparin 11/18 (no bolus) -Vascular Surgery following, appreciate input  Presumed Aspiration Pneumonia -Monitor fever curve -Trend WBC's & Procalcitonin -Follow cultures as above -Continue empiric Unasyn pending cultures & sensitivities  Sedation needs in setting of mechanical ventilation PMHx: MS -Maintain a RASS goal of 0 to -1 -Fentanyl and Precedex as needed to maintain RASS goal -Avoid sedating medications as able -Daily wake up assessment  Hyperglycemia -CBG's q4h; Target range of 140 to 180 -SSI -Follow ICU Hypo/Hyperglycemia protocol      Pt is critically ill.  High risk for decompensation, cardiac arrest, and death.   Best Practice (right click and "Reselect all SmartList Selections" daily)   Diet/type: NPO, start tube feeds DVT prophylaxis: SCD GI  prophylaxis: PPI Lines: N/A Foley:  N/A Code Status:  full code  11/17: Pt's significant other updated at bedside.  Labs   CBC: Recent Labs  Lab 02/24/22 1541 02/25/22 0258 02/26/22 0046 02/26/22 0051 02/26/22 0307 02/26/22 1014 02/26/22 1512  02/27/22 0421  WBC 15.7* 17.4*  --  26.3* 30.3*  --   --  17.5*  NEUTROABS  --   --   --   --   --   --   --  14.1*  HGB 9.3* 8.0*   < > 5.6* 6.6* 6.9* 8.4* 8.3*  HCT 29.6* 25.2*   < > 18.1* 21.2* 21.4* 25.2* 25.6*  MCV 91.6 94.4  --  93.8 93.0  --   --  89.2  PLT 231 206  --  182 187  --   --  145*   < > = values in this interval not displayed.     Basic Metabolic Panel: Recent Labs  Lab 02/24/22 1541 02/25/22 0258 02/26/22 0051 02/26/22 0307 02/27/22 0421  NA 138 139 140 139 144  K 3.7 3.6 4.1 4.1 3.9  CL 106 109 111 109 111  CO2 _0 GLUCOSE 106* 119* 104* 123* 156*  BUN _1 CREATININE 0.62 0.73 0.72 0.68 0.50  CALCIUM 9.2 8.1* 7.1* 7.7* 8.7*  MG  --   --  1.7 1.8 2.5*  PHOS  --   --  2.6 3.0 1.8*    GFR: Estimated Creatinine Clearance: 69.5 mL/min (by C-G formula based on SCr of 0.5 mg/dL). Recent Labs  Lab 02/24/22 1854 02/24/22 2056 02/25/22 0258 02/26/22 0051 02/26/22 0307 02/27/22 0421  PROCALCITON 0.23  --   --   --   --   --   WBC  --   --  17.4* 26.3* 30.3* 17.5*  LATICACIDVEN 1.9 1.0  --   --  1.5  --      Liver Function Tests: Recent Labs  Lab 02/26/22 0307  AST 17  ALT 17  ALKPHOS 96  BILITOT 1.3*  PROT 5.8*  ALBUMIN 2.5*    No results for input(s): "LIPASE", "AMYLASE" in the last 168 hours. No results for input(s): "AMMONIA" in the last 168 hours.  ABG    Component Value Date/Time   PHART 7.25 (L) 02/25/2022 2054   PCO2ART 57 (H) 02/25/2022 2054   PO2ART 89 02/25/2022 2054   HCO3 22.6 02/26/2022 0307   ACIDBASEDEF 4.0 (H) 02/26/2022 0307   O2SAT 59.3 02/26/2022 0307     Coagulation Profile: Home Medications  Prior to Admission medications   Medication Sig Start Date End Date Taking? Authorizing Provider  cyanocobalamin 1000 MCG tablet Take by mouth.   Yes [provider]  DULoxetine (CYMBALTA) 30 MG capsule Take 30 mg by mouth daily. 06/25/21  Yes [provider]  gabapentin  (NEURONTIN) 400 MG capsule Take Gabapentin 400 mg in the morning, 400 mg in the afternoon, and 800 mg at night 12/29/21  Yes [provider]  omeprazole (PRILOSEC) 20 MG capsule Take 20 mg by mouth daily. 06/10/21  Yes [provider]  propranolol (INDERAL) 20 MG tablet Take 1 tablet by mouth 2 (two) times daily. 12/16/21  Yes [provider]  rOPINIRole (REQUIP) 0.25 MG tablet Take 0.25 mg at night for 1 week, then increase to 0.5 mg at night 05/29/21  Yes [provider]  tolterodine (DETROL LA) 2 MG 24 hr capsule Take 2 mg by mouth  2 (two) times daily. 12/29/21 12/29/22 Yes [provider]  ascorbic acid (VITAMIN C) 500 MG tablet Take by mouth.    [provider]  Cholecalciferol 50 MCG (2000 UT) CAPS Take by mouth.    [provider]  cyclobenzaprine (FLEXERIL) 10 MG tablet Take 10 mg by mouth 2 (two) times daily as needed. 11/26/21   [provider]  dalfampridine 10 MG TB12 Take 1 tablet by mouth every 12 (twelve) hours. 12/29/21   [provider]  meloxicam (MOBIC) 15 MG tablet Take 1 tablet (15 mg total) by mouth daily. 08/23/21 08/23/22  Cuthriell, Charline Bills, PA-C  methylPREDNISolone (MEDROL DOSEPAK) 4 MG TBPK tablet Take by mouth. 11/26/21   [provider]  naproxen (NAPROSYN) 500 MG tablet Take by mouth.    [provider]  nitrofurantoin, macrocrystal-monohydrate, (MACROBID) 100 MG capsule Take 1 capsule (100 mg total) by mouth 2 (two) times daily. 01/19/22   Immordino, Annie Main, FNP  pregabalin (LYRICA) 75 MG capsule Take 75 mg by mouth 3 (three) times daily. 08/18/21   [provider]  traMADol (ULTRAM) 50 MG tablet Take 50 mg by mouth 3 (three) times daily as needed. 12/26/21   [provider]      CASE DISCUSSED IN MULTIDISCIPLINARY ROUNDS WITH ICU TEAM     Critical Care Time devoted to patient care services described in this note is 40 minutes.  Critical care was necessary to  treat /prevent imminent and life-threatening deterioration. Overall, patient is critically ill, prognosis is guarded.  Patient with Multiorgan failure and at high risk for cardiac arrest and death.    Darel Hong, AGACNP-BC [facility] Prefer epic messenger for cross cover needs If after hours, please call E-link

## 2022-02-27 NOTE — TOC Progression Note (Signed)
Transition of Care [facility]) - Progression Note    Patient Details  Name: [name] MRN: [id number] Date of Birth: 13-Dec-1960  Transition of Care [facility]) CM/SW Contact  Allayne Butcher, RN Phone Number: 02/27/2022, 11:56 AM  Clinical Narrative:     Patient currently in the ICU intubated and sedated, vent being weaned for possible extubation this afternoon.    TOC will follow for needs.         Expected Discharge Plan and Services                                                 Social Determinants of Health (SDOH) Interventions    Readmission Risk Interventions     No data to display

## 2022-02-28 ENCOUNTER — Inpatient Hospital Stay: Payer: Medicare Other

## 2022-02-28 DIAGNOSIS — I2699 Other pulmonary embolism without acute cor pulmonale: Secondary | ICD-10-CM | POA: Diagnosis not present

## 2022-02-28 LAB — GLUCOSE, CAPILLARY
Glucose-Capillary: 114 mg/dL — ABNORMAL HIGH (ref 70–99)
Glucose-Capillary: 139 mg/dL — ABNORMAL HIGH (ref 70–99)
Glucose-Capillary: 139 mg/dL — ABNORMAL HIGH (ref 70–99)
Glucose-Capillary: 140 mg/dL — ABNORMAL HIGH (ref 70–99)
Glucose-Capillary: 158 mg/dL — ABNORMAL HIGH (ref 70–99)
Glucose-Capillary: 177 mg/dL — ABNORMAL HIGH (ref 70–99)

## 2022-02-28 LAB — HEPARIN LEVEL (UNFRACTIONATED)
Heparin Unfractionated: 0.19 IU/mL — ABNORMAL LOW (ref 0.30–0.70)
Heparin Unfractionated: 0.3 IU/mL (ref 0.30–0.70)
Heparin Unfractionated: 0.26 IU/mL — ABNORMAL LOW (ref 0.30–0.70)

## 2022-02-28 LAB — CBC
HCT: 28 % — ABNORMAL LOW (ref 36.0–46.0)
Hemoglobin: 8.8 g/dL — ABNORMAL LOW (ref 12.0–15.0)
MCH: 28.9 pg (ref 26.0–34.0)
MCHC: 31.4 g/dL (ref 30.0–36.0)
MCV: 92.1 fL (ref 80.0–100.0)
Platelets: 167 10*3/uL (ref 150–400)
RBC: 3.04 MIL/uL — ABNORMAL LOW (ref 3.87–5.11)
RDW: 19.1 % — ABNORMAL HIGH (ref 11.5–15.5)
WBC: 22.9 10*3/uL — ABNORMAL HIGH (ref 4.0–10.5)
nRBC: 0.2 % (ref 0.0–0.2)

## 2022-02-28 LAB — RENAL FUNCTION PANEL
Albumin: 2.9 g/dL — ABNORMAL LOW (ref 3.5–5.0)
Anion gap: 7 (ref 5–15)
BUN: 16 mg/dL (ref 6–20)
CO2: 31 mmol/L (ref 22–32)
Calcium: 9.1 mg/dL (ref 8.9–10.3)
Chloride: 107 mmol/L (ref 98–111)
Creatinine, Ser: 0.43 mg/dL — ABNORMAL LOW (ref 0.44–1.00)
GFR, Estimated: >60 mL/min (ref >60)
Glucose, Bld: 145 mg/dL — ABNORMAL HIGH (ref 70–99)
Phosphorus: 2.3 mg/dL — ABNORMAL LOW (ref 2.5–4.6)
Potassium: 4.2 mmol/L (ref 3.5–5.1)
Sodium: 145 mmol/L (ref 135–145)

## 2022-02-28 LAB — PROCALCITONIN: Procalcitonin: 1.61 ng/mL

## 2022-02-28 LAB — TRIGLYCERIDES: Triglycerides: 116 mg/dL (ref <150)

## 2022-02-28 LAB — MAGNESIUM: Magnesium: 2.2 mg/dL (ref 1.7–2.4)

## 2022-02-28 MED ORDER — MIDODRINE HCL 5 MG PO TABS
10.0000 mg | ORAL_TABLET | Freq: Three times a day (TID) | ORAL | Status: DC
  Administered 2022-02-28 – 2022-03-02 (×8): 10 mg
  Filled 2022-02-28 (×8): qty 2

## 2022-02-28 MED ORDER — HEPARIN BOLUS VIA INFUSION
850.0000 [IU] | Freq: Once | INTRAVENOUS | Status: AC
  Administered 2022-02-28: 850 [IU] via INTRAVENOUS
  Filled 2022-02-28: qty 850

## 2022-02-28 MED ORDER — HEPARIN BOLUS VIA INFUSION
800.0000 [IU] | Freq: Once | INTRAVENOUS | Status: AC
  Administered 2022-02-28: 800 [IU] via INTRAVENOUS
  Filled 2022-02-28: qty 800

## 2022-02-28 MED ORDER — FUROSEMIDE 10 MG/ML IJ SOLN
40.0000 mg | Freq: Once | INTRAMUSCULAR | Status: AC
  Administered 2022-02-28: 40 mg via INTRAVENOUS
  Filled 2022-02-28: qty 4

## 2022-02-28 NOTE — Plan of Care (Signed)
  Problem: Education: Goal: Knowledge of General Education information will improve Description: Including pain rating scale, medication(s)/side effects and non-pharmacologic comfort measures Outcome: Not Progressing   Problem: Health Behavior/Discharge Planning: Goal: Ability to manage health-related needs will improve Outcome: Not Progressing   Problem: Clinical Measurements: Goal: Ability to maintain clinical measurements within normal limits will improve Outcome: Not Progressing Goal: Will remain free from infection Outcome: Not Progressing Goal: Diagnostic test results will improve Outcome: Not Progressing Goal: Respiratory complications will improve Outcome: Not Progressing Goal: Cardiovascular complication will be avoided Outcome: Not Progressing   Problem: Activity: Goal: Risk for activity intolerance will decrease Outcome: Not Progressing   Problem: Nutrition: Goal: Adequate nutrition will be maintained Outcome: Not Progressing   Problem: Coping: Goal: Level of anxiety will decrease Outcome: Not Progressing   Problem: Elimination: Goal: Will not experience complications related to bowel motility Outcome: Not Progressing Goal: Will not experience complications related to urinary retention Outcome: Not Progressing   Problem: Pain Managment: Goal: General experience of comfort will improve Outcome: Not Progressing   Problem: Safety: Goal: Ability to remain free from injury will improve Outcome: Not Progressing   Problem: Skin Integrity: Goal: Risk for impaired skin integrity will decrease Outcome: Not Progressing   Problem: Activity: Goal: Ability to tolerate increased activity will improve Outcome: Not Progressing   Problem: Respiratory: Goal: Ability to maintain a clear airway and adequate ventilation will improve Outcome: Not Progressing   Problem: Role Relationship: Goal: Method of communication will improve Outcome: Not Progressing   Problem:  Education: Goal: Ability to describe self-care measures that may prevent or decrease complications (Diabetes Survival Skills Education) will improve Outcome: Not Progressing Goal: Individualized Educational Video(s) Outcome: Not Progressing   Problem: Coping: Goal: Ability to adjust to condition or change in health will improve Outcome: Not Progressing   Problem: Fluid Volume: Goal: Ability to maintain a balanced intake and output will improve Outcome: Not Progressing   Problem: Health Behavior/Discharge Planning: Goal: Ability to identify and utilize available resources and services will improve Outcome: Not Progressing Goal: Ability to manage health-related needs will improve Outcome: Not Progressing   Problem: Metabolic: Goal: Ability to maintain appropriate glucose levels will improve Outcome: Not Progressing   Problem: Nutritional: Goal: Maintenance of adequate nutrition will improve Outcome: Not Progressing Goal: Progress toward achieving an optimal weight will improve Outcome: Not Progressing   Problem: Skin Integrity: Goal: Risk for impaired skin integrity will decrease Outcome: Not Progressing   Problem: Tissue Perfusion: Goal: Adequacy of tissue perfusion will improve Outcome: Not Progressing   

## 2022-02-28 NOTE — Progress Notes (Signed)
PHARMACY CONSULT NOTE  Pharmacy Consult for Electrolyte Monitoring and Replacement   Recent Labs: Potassium (mmol/L)  Date Value  02/27/2022 3.9   Magnesium (mg/dL)  Date Value  57/26/2035 2.2   Calcium (mg/dL)  Date Value  59/74/1638 8.7 (L)   Albumin (g/dL)  Date Value  45/36/4680 2.5 (L)   Phosphorus (mg/dL)  Date Value  32/03/2481 1.8 (L)   Sodium (mmol/L)  Date Value  02/27/2022 144   Assessment: 61 yo female presented to ED with SOB when lying flat and some right sided rib and shoulder pain.  Chest CT showed acute PE.  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 Pharmacy is asked to follow and replace electrolytes while in CCU  Goal of Therapy:  Electrolytes within normal limits  Plan:  --Phosphorous 2.3, mildly low but improved with replacement yesterday. Will continue to monitor for now --Follow-up electrolytes with AM labs tomorrow  [name] 02/28/2022 7:49 AM

## 2022-02-28 NOTE — Consult Note (Addendum)
ANTICOAGULATION CONSULT NOTE  Pharmacy Consult for Heparin Drip Indication: pulmonary embolus  Patient Measurements: Height: 5\' 2"  (157.5 cm) Weight: 72.8 kg (160 lb 7.9 oz) IBW/kg (Calculated) : 50.1 Heparin Dosing Weight: 57.2 kg  Labs: Recent Labs    02/26/22 0307 02/26/22 1014 02/26/22 1512 02/27/22 0421 02/27/22 2311 02/28/22 0727 02/28/22 1505  HGB 6.6*   < > 8.4* 8.3*  --  8.8*  --   HCT 21.2*   < > 25.2* 25.6*  --  28.0*  --   PLT 187  --   --  145*  --  167  --   HEPARINUNFRC  --   --   --   --  0.21* 0.19* 0.30  CREATININE 0.68  --   --  0.50  --  0.43*  --    < > = values in this interval not displayed.     Estimated Creatinine Clearance: 71.6 mL/min (A) (by C-G formula based on SCr of 0.43 mg/dL (L)).   Medical History: Past Medical History:  Diagnosis Date   GERD (gastroesophageal reflux disease)    Multiple sclerosis ([facility])     Medications:  No home anticoagulation per pharmacist review.  Assessment: 61 yo female presented to ED with SOB when lying flat and some right sided rib and shoulder pain.  Chest CT showed acute PE.  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 Heparin was held in setting of hemoptysis which has resolved  Goal of Therapy:  Heparin level 0.3-0.7 units/ml Monitor platelets by anticoagulation protocol: Yes   1117 2311 HL 0.21, subtherapeutic; 1150 un/hr 1118 0727 HL 0.19, subtherapeutic; 1250 un/hr 1118 1505 HL 0.30, therapeutic x 1  Plan:  --Heparin level is therapeutic --Continue heparin infusion rate at 1350 un/hr --Re-check HL 6 hours from last level --Daily CBC per protocol while on IV heparin  41  02/28/2022 3:42 PM

## 2022-02-28 NOTE — Progress Notes (Signed)
NAME:  [name], MRN:  [id number], DOB:  05-Feb-1961, LOS: 4 ADMISSION DATE:  02/24/2022, CONSULTATION DATE:  02/25/2022 REFERRING MD:  Dr. Lucky Cowboy, CHIEF COMPLAINT:  Acute Respiratory Distress & hypoxia, development of Hemoptysis post thrombectomy  Brief Pt Description / Synopsis:  61 y.o. female admitted with Acute Hypoxic Respiratory Failure in setting of Bilateral Pulmonary Embolism.  Underwent thrombectomy with Vascular Surgery, post procedure developed Hemoptysis leading to severe blockage of airways requiring emergent intubation. S/p bronch  therapeutic aspiration of blood clots from tracheobronchial tree.  History of Present Illness:  [name] is a 61 year old female with history of hypertension, depression, anxiety, restless leg syndrome, GERD, who presents emergency department for chief concerns of shortness of breath via POV.  Pt is currently intubated and sedation and unable to contribute to history, therefore history is obtained from chart review.   Per ED and nursing notes, she reported that she developed shortness of breath 3 to 4 days ago.  She denies known fever at home and trauma to her person.  She denies chest pain, abdominal pain, dysuria, hematuria.  She did have 1 episode of diarrhea today.  She denies blood in her stool.  She endorses having COVID about 4 weeks ago.   She denies recent international travel or long car trips.  She denies history of cancer that she knows of.  She denies recent surgery.   She denies changes to her leg pain.  She states at baseline she has leg pain because of MS and restless leg syndrome.  ED Course: Initial Vital Signs:  temperature of 99.1, respiration rate of 20, heart rate of 140, blood pressure 124/59, SPO2 of 94% on room air.  Significant Labs: Serum sodium is 138, potassium 2.7, chloride 106, bicarb 23, BUN of 14, serum creatinine 0.62, EGFR greater than 60, nonfasting blood glucose 106, WBC 15.7, hemoglobin  9.3, platelets of 231.  Imaging CTA PE: Was read as acute PE with moderate thrombus burden involving both lungs.  RV-LV ratio is 1.3 suggesting acute or chronic right heart dysfunction.  Small right pleural effusion.  Patchy infiltrates in the perihilar regions of both lung fields, right more than left.  Moderate to large size fixed hiatal hernia.  Bilateral lower extremity venous doppler ultrasounds>>  occlusive thrombus in both peroneal veins.  Medications Administered: Ceftriaxone 2 g IV, vancomycin, LR 150 mL bolus, doxycycline.   Hospitalist were asked to admit for further workup and treatment.  Vascular Surgery was consulted with plan for Thrombectomy.  Please see "significant hospital events" section below for full detailed hospital course.    Pertinent  Medical History   Past Medical History:  Diagnosis Date   GERD (gastroesophageal reflux disease)    Multiple sclerosis ([place])      Micro Data:  11/14: Blood culture x2>>NGTD 11/15: MRSA PCR>>negative  Antimicrobials:  Ceftriaxone 11/14>>11/14 Doxycycline 11/14>>11/14 Vancomycin 11/14>>11/14 Unasyn 11/15>>  Significant Hospital Events: Including procedures, antibiotic start and stop dates in addition to other pertinent events   11/14: Admitted by Hospitalist.  Vascular Surgery consulted 11/15:  Thrombectomy performed, massive amounts of clot removed.  Post procedure developed Acute Hemoptysis requiring emergent intubation and transfer to ICU.  PCCM consulted, plan for emergent Bronchoscopy 11/15 3 hour BRONCH to extract clots 11/16  2 units PRBC's to be given.  Repeat Bronch. 11/17: Off pressors. Increasing FiO2 requirements (60% FiO2, 5 peep), CXR with increase in bilateral opacities concerning for PNA.  NO SBT today. Place OG and start feeds. 11/18: Heparin  gtt restarted yesterday with no bolus, no hemoptysis reported and Hgb remains stable.  Slow improvement in FiO2 to 60% (from 75%) with diuresis yesterday, renal  function remains normal so will diurese again today  Interim History / Subjective:  -No acute events noted overnight -Afebrile, hemodynamically stable, requiring low dose peripheral Levophed ~ will start Midodrine -Decrease in FiO2 to 60% (from 85%), 10 PEEP; CXR improved with diuresis yesterday ~ BNP was 350, will diurese again today -Creatinine normal, UOP 925 cc (net + 6.8L) ~ will diurese again today -Hgb stable at 8.8 from 8.3  -Nursing & respiratory reports no secretions from ETT to be able to obtain TA -Heparin gtt was restarted yesterday evening, no reports of hemoptysis   Objective   Blood pressure 97/68, pulse (!) 104, temperature 98.2 F (36.8 C), temperature source Oral, resp. rate 19, height _0  (1.575 m), weight 72.8 kg, SpO2 95 %.    Vent Mode: PRVC FiO2 (%):  [60 %-85 %] 60 % Set Rate:  [20 bmp] 20 bmp Vt Set:  [400 mL] 400 mL PEEP:  [10 cmH20] 10 cmH20 Plateau Pressure:  [23 cmH20] 23 cmH20   Intake/Output Summary (Last 24 hours) at 02/28/2022 0850 Last data filed at 02/28/2022 0800 Gross per 24 hour  Intake 2283.97 ml  Output 1040 ml  Net 1243.97 ml    Filed Weights   02/24/22 1710 02/25/22 0400 02/28/22 0500  Weight: 57.2 kg 68.4 kg 72.8 kg     REVIEW OF SYSTEMS PATIENT IS UNABLE TO PROVIDE COMPLETE REVIEW OF SYSTEMS DUE TO SEVERE CRITICAL ILLNESS   PHYSICAL EXAMINATION:  GENERAL:critically ill appearing female, laying in bed, intubated and lightly sedated, NAD  EYES: Pupils equal, round, reactive to light.  No scleral icterus.  MOUTH: Moist mucosal membrane. Orally INTUBATED NECK: Supple. No JVD PULMONARY: Coarse to auscultation throughout, even, occasionally overbreathing the vent CARDIOVASCULAR: S1 and S2.  Tachycardia, Regular rhythm GASTROINTESTINAL: Soft, nontender, -distended. Positive bowel sounds.  MUSCULOSKELETAL: No swelling, clubbing, or edema.  NEUROLOGIC: Sedated, withdraws from pain, currently not following commands, pupils  PERRL SKIN:normal, warm to touch,  Pulses present bilaterally  Resolved Hospital Problem list     Assessment & Plan:    Severe ACUTE Hypoxic Respiratory Failure in setting of Bilateral Pulmonary Embolism, Hemoptysis, presumed Aspiration & Pulmonary Edema S/p Bronchoscopy x2 -Full vent support, implement lung protective strategies -Plateau pressures less than 30 cm H20 -Wean FiO2 & PEEP as tolerated to maintain O2 sats >92% -Follow intermittent Chest X-ray & ABG as needed -Spontaneous Breathing Trials when respiratory parameters met and mental status permits -Implement VAP Bundle -Bronchodilators & Pulmicort nebs -IV Steroids -ABX as above -Aggressive pulmonary toilet as able -Heparin gtt resumed 11/17 -Diuresis as BP and renal function permits  Bilateral Pulmonary Embolism & Bilateral peroneal lower extremity thrombus, suspect in setting of recent COVID-19 infection about 3 weeks prior Hypotension: Related to sedation +/- acute blood loss with hemoptysis +/- sepsis from aspiration/pneumonia Echocardiogram 02/24/22: LVEF 19-37%, normal diastolic parameters, RV size and systolic function normal S/p Thrombectomy 11/15 -Continuous cardiac monitoring -Maintain MAP >65 -Vasopressors as needed to maintain MAP goal -Start Midodrine -Trend lactic acid until normalized -HS Troponin negative -Heparin gtt resumed 11/17 -Vascular Surgery following, appreciate input  Presumed Aspiration Pneumonia -Monitor fever curve -Trend WBC's & Procalcitonin -Follow cultures as above -Continue empiric Unasyn pending cultures & sensitivities  Sedation needs in setting of mechanical ventilation PMHx: MS -Maintain a RASS goal of 0 to -1 -Fentanyl and Precedex as needed  to maintain RASS goal -Avoid sedating medications as able -Daily wake up assessment  Hyperglycemia -CBG's q4h; Target range of 140 to 180 -SSI -Follow ICU Hypo/Hyperglycemia protocol      Pt is critically ill.  High risk  for decompensation, cardiac arrest, and death.   Best Practice (right click and "Reselect all SmartList Selections" daily)   Diet/type: NPO,  tube feeds DVT prophylaxis: SCD, Heparin gtt GI prophylaxis: PPI Lines: N/A Foley:  yes, and is still needed Code Status:  full code  11/18: Pt's significant other updated at bedside.  Labs   CBC: Recent Labs  Lab 02/25/22 0258 02/26/22 0046 02/26/22 0051 02/26/22 0307 02/26/22 1014 02/26/22 1512 02/27/22 0421 02/28/22 0727  WBC 17.4*  --  26.3* 30.3*  --   --  17.5* 22.9*  NEUTROABS  --   --   --   --   --   --  14.1*  --   HGB 8.0*   < > 5.6* 6.6* 6.9* 8.4* 8.3* 8.8*  HCT 25.2*   < > 18.1* 21.2* 21.4* 25.2* 25.6* 28.0*  MCV 94.4  --  93.8 93.0  --   --  89.2 92.1  PLT 206  --  182 187  --   --  145* 167   < > = values in this interval not displayed.     Basic Metabolic Panel: Recent Labs  Lab 02/25/22 0258 02/26/22 0051 02/26/22 0307 02/27/22 0421 02/28/22 0727  NA 139 140 139 144 145  K 3.6 4.1 4.1 3.9 4.2  CL 109 111 109 111 107  CO2 _0 GLUCOSE 119* 104* 123* 156* 145*  BUN _1 CREATININE 0.73 0.72 0.68 0.50 0.43*  CALCIUM 8.1* 7.1* 7.7* 8.7* 9.1  MG  --  1.7 1.8 2.5* 2.2  PHOS  --  2.6 3.0 1.8* 2.3*    GFR: Estimated Creatinine Clearance: 71.6 mL/min (A) (by C-G formula based on SCr of 0.43 mg/dL (L)). Recent Labs  Lab 02/24/22 1854 02/24/22 2056 02/25/22 0258 02/26/22 0051 02/26/22 0307 02/27/22 0421 02/28/22 0726 02/28/22 0727  PROCALCITON 0.23  --   --   --   --   --  1.61  --   WBC  --   --    < > 26.3* 30.3* 17.5*  --  22.9*  LATICACIDVEN 1.9 1.0  --   --  1.5  --   --   --    < > = values in this interval not displayed.     Liver Function Tests: Recent Labs  Lab 02/26/22 0307 02/28/22 0727  AST 17  --   ALT 17  --   ALKPHOS 96  --   BILITOT 1.3*  --   PROT 5.8*  --   ALBUMIN 2.5* 2.9*    No results for input(s): "LIPASE", "AMYLASE" in the last 168  hours. No results for input(s): "AMMONIA" in the last 168 hours.  ABG    Component Value Date/Time   PHART 7.25 (L) 02/25/2022 2054   PCO2ART 57 (H) 02/25/2022 2054   PO2ART 89 02/25/2022 2054   HCO3 22.6 02/26/2022 0307   ACIDBASEDEF 4.0 (H) 02/26/2022 0307   O2SAT 59.3 02/26/2022 0307     Coagulation Profile: Home Medications  Prior to Admission medications   Medication Sig Start Date End Date Taking? Authorizing Provider  cyanocobalamin 1000 MCG tablet Take by mouth.   Yes [provider]  DULoxetine (CYMBALTA) 30  MG capsule Take 30 mg by mouth daily. 06/25/21  Yes [provider]  gabapentin (NEURONTIN) 400 MG capsule Take Gabapentin 400 mg in the morning, 400 mg in the afternoon, and 800 mg at night 12/29/21  Yes [provider]  omeprazole (PRILOSEC) 20 MG capsule Take 20 mg by mouth daily. 06/10/21  Yes [provider]  propranolol (INDERAL) 20 MG tablet Take 1 tablet by mouth 2 (two) times daily. 12/16/21  Yes [provider]  rOPINIRole (REQUIP) 0.25 MG tablet Take 0.25 mg at night for 1 week, then increase to 0.5 mg at night 05/29/21  Yes [provider]  tolterodine (DETROL LA) 2 MG 24 hr capsule Take 2 mg by mouth 2 (two) times daily. 12/29/21 12/29/22 Yes [provider]  ascorbic acid (VITAMIN C) 500 MG tablet Take by mouth.    [provider]  Cholecalciferol 50 MCG (2000 UT) CAPS Take by mouth.    [provider]  cyclobenzaprine (FLEXERIL) 10 MG tablet Take 10 mg by mouth 2 (two) times daily as needed. 11/26/21   [provider]  dalfampridine 10 MG TB12 Take 1 tablet by mouth every 12 (twelve) hours. 12/29/21   [provider]  meloxicam (MOBIC) 15 MG tablet Take 1 tablet (15 mg total) by mouth daily. 08/23/21 08/23/22  Cuthriell, Charline Bills, PA-C  methylPREDNISolone (MEDROL DOSEPAK) 4 MG TBPK tablet Take by mouth. 11/26/21   [provider]  naproxen (NAPROSYN) 500 MG  tablet Take by mouth.    [provider]  nitrofurantoin, macrocrystal-monohydrate, (MACROBID) 100 MG capsule Take 1 capsule (100 mg total) by mouth 2 (two) times daily. 01/19/22   Immordino, Annie Main, FNP  pregabalin (LYRICA) 75 MG capsule Take 75 mg by mouth 3 (three) times daily. 08/18/21   [provider]  traMADol (ULTRAM) 50 MG tablet Take 50 mg by mouth 3 (three) times daily as needed. 12/26/21   [provider]        Critical Care Time devoted to patient care services described in this note is 40 minutes.  Critical care was necessary to treat /prevent imminent and life-threatening deterioration. Overall, patient is critically ill, prognosis is guarded.  Patient with Multiorgan failure and at high risk for cardiac arrest and death.    Darel Hong, AGACNP-BC [facility] Prefer epic messenger for cross cover needs If after hours, please call E-link

## 2022-02-28 NOTE — Consult Note (Signed)
ANTICOAGULATION CONSULT NOTE  Pharmacy Consult for Heparin Drip Indication: pulmonary embolus  Patient Measurements: Height: 5\' 2"  (157.5 cm) Weight: 72.8 kg (160 lb 7.9 oz) IBW/kg (Calculated) : 50.1 Heparin Dosing Weight: 57.2 kg  Labs: Recent Labs    02/26/22 0307 02/26/22 1014 02/26/22 1512 02/27/22 0421 02/27/22 2311 02/28/22 0727 02/28/22 1505 02/28/22 2057  HGB 6.6*   < > 8.4* 8.3*  --  8.8*  --   --   HCT 21.2*   < > 25.2* 25.6*  --  28.0*  --   --   PLT 187  --   --  145*  --  167  --   --   HEPARINUNFRC  --   --   --   --    < > 0.19* 0.30 0.26*  CREATININE 0.68  --   --  0.50  --  0.43*  --   --    < > = values in this interval not displayed.     Estimated Creatinine Clearance: 71.6 mL/min (A) (by C-G formula based on SCr of 0.43 mg/dL (L)).   Medical History: Past Medical History:  Diagnosis Date   GERD (gastroesophageal reflux disease)    Multiple sclerosis ([facility])     Medications:  No home anticoagulation per pharmacist review.  Assessment: 61 yo female presented to ED with SOB when lying flat and some right sided rib and shoulder pain.  Chest CT showed acute PE.  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 Heparin was held in setting of hemoptysis which has resolved  Goal of Therapy:  Heparin level 0.3-0.7 units/ml Monitor platelets by anticoagulation protocol: Yes   1117 2311 HL 0.21, subtherapeutic; 1150 un/hr 1118 0727 HL 0.19, subtherapeutic; 1250 un/hr 1118 1505 HL 0.30, therapeutic x 1 1118 2057 HL 0.26, subtherapeutic; 1350 un/hr  Plan:  --Heparin level is subtherapeutic --Give bolus of 850 units and increase heparin infusion rate to 1450 un/hr --Re-check HL 6 hours from last level --Daily CBC per protocol while on IV heparin  41  02/28/2022 10:05 PM

## 2022-02-28 NOTE — Consult Note (Signed)
ANTICOAGULATION CONSULT NOTE  Pharmacy Consult for Heparin Drip Indication: pulmonary embolus  Patient Measurements: Height: 5\' 2"  (157.5 cm) Weight: 72.8 kg (160 lb 7.9 oz) IBW/kg (Calculated) : 50.1 Heparin Dosing Weight: 57.2 kg  Labs: Recent Labs    02/25/22 1924 02/26/22 0046 02/26/22 0051 02/26/22 0307 02/26/22 1014 02/26/22 1512 02/27/22 0421 02/27/22 2311 02/28/22 0727  HGB  --    < > 5.6* 6.6*   < > 8.4* 8.3*  --  8.8*  HCT  --    < > 18.1* 21.2*   < > 25.2* 25.6*  --  28.0*  PLT  --    < > 182 187  --   --  145*  --  167  HEPARINUNFRC <0.10*  --   --   --   --   --   --  0.21* 0.19*  CREATININE  --   --  0.72 0.68  --   --  0.50  --   --    < > = values in this interval not displayed.     Estimated Creatinine Clearance: 71.6 mL/min (by C-G formula based on SCr of 0.5 mg/dL).   Medical History: Past Medical History:  Diagnosis Date   GERD (gastroesophageal reflux disease)    Multiple sclerosis ([facility])     Medications:  No home anticoagulation per pharmacist review.  Assessment: 61 yo female presented to ED with SOB when lying flat and some right sided rib and shoulder pain.  Chest CT showed acute PE.  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 Heparin was held in setting of hemoptysis which has resolved  Goal of Therapy:  Heparin level 0.3-0.7 units/ml Monitor platelets by anticoagulation protocol: Yes   1117 2311 HL 0.21, subtherapeutic; 1150 un/hr 1118 0727 HL 0.19, subtherapeutic; 1250 un/hr  Plan:  --Heparin level is subtherapeutic --Heparin 800 unit bolus and increase heparin infusion rate to 1350 un/hr --Re-check HL 6 hours from rate change --Daily CBC per protocol while on IV heparin  41  02/28/2022 7:46 AM

## 2022-03-01 ENCOUNTER — Inpatient Hospital Stay: Payer: Medicare Other

## 2022-03-01 DIAGNOSIS — I2699 Other pulmonary embolism without acute cor pulmonale: Secondary | ICD-10-CM | POA: Diagnosis not present

## 2022-03-01 LAB — RENAL FUNCTION PANEL
Albumin: 2.6 g/dL — ABNORMAL LOW (ref 3.5–5.0)
Anion gap: 5 (ref 5–15)
BUN: 31 mg/dL — ABNORMAL HIGH (ref 6–20)
CO2: 38 mmol/L — ABNORMAL HIGH (ref 22–32)
Calcium: 9.5 mg/dL (ref 8.9–10.3)
Chloride: 104 mmol/L (ref 98–111)
Creatinine, Ser: 0.42 mg/dL — ABNORMAL LOW (ref 0.44–1.00)
GFR, Estimated: >60 mL/min (ref >60)
Glucose, Bld: 128 mg/dL — ABNORMAL HIGH (ref 70–99)
Phosphorus: 2.3 mg/dL — ABNORMAL LOW (ref 2.5–4.6)
Potassium: 4.4 mmol/L (ref 3.5–5.1)
Sodium: 147 mmol/L — ABNORMAL HIGH (ref 135–145)

## 2022-03-01 LAB — GLUCOSE, CAPILLARY
Glucose-Capillary: 146 mg/dL — ABNORMAL HIGH (ref 70–99)
Glucose-Capillary: 110 mg/dL — ABNORMAL HIGH (ref 70–99)
Glucose-Capillary: 136 mg/dL — ABNORMAL HIGH (ref 70–99)
Glucose-Capillary: 159 mg/dL — ABNORMAL HIGH (ref 70–99)
Glucose-Capillary: 147 mg/dL — ABNORMAL HIGH (ref 70–99)
Glucose-Capillary: 124 mg/dL — ABNORMAL HIGH (ref 70–99)

## 2022-03-01 LAB — CBC
HCT: 28.3 % — ABNORMAL LOW (ref 36.0–46.0)
Hemoglobin: 8.6 g/dL — ABNORMAL LOW (ref 12.0–15.0)
MCH: 28.8 pg (ref 26.0–34.0)
MCHC: 30.4 g/dL (ref 30.0–36.0)
MCV: 94.6 fL (ref 80.0–100.0)
Platelets: 183 10*3/uL (ref 150–400)
RBC: 2.99 MIL/uL — ABNORMAL LOW (ref 3.87–5.11)
RDW: 19 % — ABNORMAL HIGH (ref 11.5–15.5)
WBC: 19.8 10*3/uL — ABNORMAL HIGH (ref 4.0–10.5)
nRBC: 0.6 % — ABNORMAL HIGH (ref 0.0–0.2)

## 2022-03-01 LAB — CULTURE, BLOOD (ROUTINE X 2)
Culture: NO GROWTH
Culture: NO GROWTH
Special Requests: ADEQUATE
Special Requests: ADEQUATE

## 2022-03-01 LAB — C-REACTIVE PROTEIN: CRP: 16.1 mg/dL — ABNORMAL HIGH (ref <1.0)

## 2022-03-01 LAB — SEDIMENTATION RATE: Sed Rate: 65 mm/hr — ABNORMAL HIGH (ref 0–30)

## 2022-03-01 LAB — HEPARIN LEVEL (UNFRACTIONATED)
Heparin Unfractionated: 0.38 IU/mL (ref 0.30–0.70)
Heparin Unfractionated: 0.3 IU/mL (ref 0.30–0.70)

## 2022-03-01 LAB — PROCALCITONIN: Procalcitonin: 1.15 ng/mL

## 2022-03-01 MED ORDER — POTASSIUM & SODIUM PHOSPHATES 280-160-250 MG PO PACK
2.0000 | PACK | Freq: Three times a day (TID) | ORAL | Status: AC
  Administered 2022-03-01: 2
  Filled 2022-03-01: qty 2

## 2022-03-01 MED ORDER — METHYLPREDNISOLONE SODIUM SUCC 40 MG IJ SOLR
40.0000 mg | Freq: Two times a day (BID) | INTRAMUSCULAR | Status: DC
  Administered 2022-03-01 – 2022-03-02 (×3): 40 mg via INTRAVENOUS
  Filled 2022-03-01 (×3): qty 1

## 2022-03-01 NOTE — Consult Note (Signed)
ANTICOAGULATION CONSULT NOTE  Pharmacy Consult for Heparin Drip Indication: pulmonary embolus  Patient Measurements: Height: 5\' 2"  (157.5 cm) Weight: 72.8 kg (160 lb 7.9 oz) IBW/kg (Calculated) : 50.1 Heparin Dosing Weight: 57.2 kg  Labs: Recent Labs    02/27/22 0421 02/27/22 2311 02/28/22 0727 02/28/22 1505 02/28/22 2057 03/01/22 0501 03/01/22 0816  HGB 8.3*  --  8.8*  --   --  8.6*  --   HCT 25.6*  --  28.0*  --   --  28.3*  --   PLT 145*  --  167  --   --  183  --   HEPARINUNFRC  --    < > 0.19*   < > 0.26* 0.38 0.30  CREATININE 0.50  --  0.43*  --   --  0.42*  --    < > = values in this interval not displayed.     Estimated Creatinine Clearance: 71.6 mL/min (A) (by C-G formula based on SCr of 0.42 mg/dL (L)).   Medical History: Past Medical History:  Diagnosis Date   GERD (gastroesophageal reflux disease)    Multiple sclerosis ([facility])     Medications:  No home anticoagulation per pharmacist review.  Assessment: 62 yo female presented to ED with SOB when lying flat and some right sided rib and shoulder pain.  Chest CT showed acute PE.  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 Heparin was held in setting of hemoptysis which has resolved  Goal of Therapy:  Heparin level 0.3-0.7 units/ml Monitor platelets by anticoagulation protocol: Yes   1117 2311 HL 0.21, subtherapeutic; 1150 un/hr 1118 0727 HL 0.19, subtherapeutic; 1250 un/hr 1118 1505 HL 0.30, therapeutic x 1 1118 2057 HL 0.26, subtherapeutic; 1350 un/hr 1119 0501 HL 0.38, therapeutic x 1 1119 0816 HL 0.30, therapeutic x 2  Plan:  --Continue heparin infusion rate at 1450 un/hr --Recheck HL tomorrow AM --Daily CBC per protocol while on IV heparin --Follow-up transition to oral anticoagulation  41  03/01/2022 9:39 AM

## 2022-03-01 NOTE — Progress Notes (Signed)
PHARMACY CONSULT NOTE  Pharmacy Consult for Electrolyte Monitoring and Replacement   Recent Labs: Potassium (mmol/L)  Date Value  03/01/2022 4.4   Magnesium (mg/dL)  Date Value  43/60/6770 2.2   Calcium (mg/dL)  Date Value  34/06/5246 9.5   Albumin (g/dL)  Date Value  18/59/0931 2.6 (L)   Phosphorus (mg/dL)  Date Value  03/28/2445 2.3 (L)   Sodium (mmol/L)  Date Value  03/01/2022 147 (H)   Assessment: 61 yo female presented to ED with SOB when lying flat and some right sided rib and shoulder pain.  Chest CT showed acute PE.  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 Pharmacy is asked to follow and replace electrolytes while in CCU.  Nutrition: Tube feeds  Goal of Therapy:  Electrolytes within normal limits  Plan:  --Na 147, defer management to primary team --Phosphorous 2.3, mildly low but stable. Phos-Nak 2 packets per tube x 1 dose --Follow-up electrolytes with AM labs tomorrow  Tressie Ellis 03/01/2022 8:03 AM

## 2022-03-01 NOTE — Progress Notes (Signed)
Pulmonary and Critical care    NAME:  [name], MRN:  [id number], DOB:  09/25/1960, LOS: 5 ADMISSION DATE:  02/24/2022,     History of Present Illness:   The patient is still requiring PEEP 10 and 60% FiO2.  Minimal bright red blood from the tracheal aspirate today.  Heparin was stopped this morning.  Spoke to significant other at bedside.  Reviewed chest x-ray images myself.  Persistent infiltrate mostly in the left lung.  Also ordered collagen vascular work-up in the setting of pulmonary hemorrhage.  Slight elevation in sodium and BUN would not diurese any further.  Patient will have sedation holiday.  So blanching rash noted on lower extremities we will continue to monitor.            03/01/2022    8:30 AM 03/01/2022    8:15 AM 03/01/2022    8:00 AM  Vitals with BMI  Systolic 109 107 751  Diastolic 59 59 67  Pulse 103 101 111    Vent Mode: PRVC FiO2 (%):  [60 %] 60 % Set Rate:  [20 bmp] 20 bmp Vt Set:  [400 mL] 400 mL PEEP:  [10 cmH20] 10 cmH20 Plateau Pressure:  [24 cmH20] 24 cmH20   Examination: General: Sedated comfortable on the ventilator. HENT: No JVD mild pallor no icterus. Lungs: Air entry is heard bilaterally.  No wheezing left-sided crackles heard  cardiovascular: S1-S2 no murmurs or gallops. Abdomen: Nontender Extremities: Edema present no cyanosis pulses palpable Neuro: Currently sedated did not move extremities spontaneously or to commands    Assessment & Plan:  1.  Acute hypoxic respiratory failure 2.  Bilateral pulmonary embolism 3.  Pulmonary hemorrhage 4.  Possible ARDS 5.  Treated for aspiration 6.  Probable acute on chronic diastolic heart failure exacerbation with interstitial pulmonary edema. 7.  Recent COVID-19 infection 8.  MS Plan 1.  hold off on further diuresis 2.  Continue on IV steroids 3.  Collagen-vascular work-up 4.  Spontaneous awakening trial 5.  Continue tube feeds 6.  Discontinue heparin as patient had  bright red tracheal aspirate 7.  Discontinue antibiotics 8.  Monitor for lower extremity rash.   Best Practice (right click and "Reselect all SmartList Selections" daily)   Diet/type: tubefeeds DVT prophylaxis: SCD GI prophylaxis: H2B Lines: Patient has peripheral access Foley:  Yes, and it is still needed Code Status:  full code   Labs   CBC: Recent Labs  Lab 02/26/22 0051 02/26/22 0307 02/26/22 1014 02/26/22 1512 02/27/22 0421 02/28/22 0727 03/01/22 0501  WBC 26.3* 30.3*  --   --  17.5* 22.9* 19.8*  NEUTROABS  --   --   --   --  14.1*  --   --   HGB 5.6* 6.6* 6.9* 8.4* 8.3* 8.8* 8.6*  HCT 18.1* 21.2* 21.4* 25.2* 25.6* 28.0* 28.3*  MCV 93.8 93.0  --   --  89.2 92.1 94.6  PLT 182 187  --   --  145* 167 183    Basic Metabolic Panel: Recent Labs  Lab 02/26/22 0051 02/26/22 0307 02/27/22 0421 02/28/22 0727 03/01/22 0501  NA 140 139 144 145 147*  K 4.1 4.1 3.9 4.2 4.4  CL 111 109 111 107 104  CO2 23 22 26 31  38*  GLUCOSE 104* 123* 156* 145* 128*  BUN 14 14 14 16  31*  CREATININE 0.72 0.68 0.50 0.43* 0.42*  CALCIUM 7.1* 7.7* 8.7* 9.1 9.5  MG 1.7 1.8 2.5* 2.2  --   PHOS 2.6  3.0 1.8* 2.3* 2.3*   GFR: Estimated Creatinine Clearance: 71.6 mL/min (A) (by C-G formula based on SCr of 0.42 mg/dL (L)). Recent Labs  Lab 02/24/22 1854 02/24/22 2056 02/25/22 0258 02/26/22 0307 02/27/22 0421 02/28/22 0726 02/28/22 0727 03/01/22 0501  PROCALCITON 0.23  --   --   --   --  1.61  --  1.15  WBC  --   --    < > 30.3* 17.5*  --  22.9* 19.8*  LATICACIDVEN 1.9 1.0  --  1.5  --   --   --   --    < > = values in this interval not displayed.    Liver Function Tests: Recent Labs  Lab 02/26/22 0307 02/28/22 0727 03/01/22 0501  AST 17  --   --   ALT 17  --   --   ALKPHOS 96  --   --   BILITOT 1.3*  --   --   PROT 5.8*  --   --   ALBUMIN 2.5* 2.9* 2.6*   No results for input(s): "LIPASE", "AMYLASE" in the last 168 hours. No results for input(s): "AMMONIA" in the last  168 hours.  ABG    Component Value Date/Time   PHART 7.25 (L) 02/25/2022 2054   PCO2ART 57 (H) 02/25/2022 2054   PO2ART 89 02/25/2022 2054   HCO3 22.6 02/26/2022 0307   ACIDBASEDEF 4.0 (H) 02/26/2022 0307   O2SAT 59.3 02/26/2022 0307     Coagulation Profile: Recent Labs  Lab 02/24/22 1854  INR 1.3*    Cardiac Enzymes: No results for input(s): "CKTOTAL", "CKMB", "CKMBINDEX", "TROPONINI" in the last 168 hours.  HbA1C: No results found for: "HGBA1C"  CBG: Recent Labs  Lab 02/28/22 1611 02/28/22 1945 02/28/22 2340 03/01/22 0348 03/01/22 0746  GLUCAP 158* 177* 139* 146* 110*    RADIOLOGY Personally I have seen the chest x-ray images  Review of Systems:    Patient unable to give review of systems  Past Medical History:  She,  has a past medical history of GERD (gastroesophageal reflux disease) and Multiple sclerosis (HCC).   Surgical History:   Past Surgical History:  Procedure Laterality Date   ESOPHAGOGASTRODUODENOSCOPY (EGD) WITH PROPOFOL N/A 09/11/2021   Procedure: ESOPHAGOGASTRODUODENOSCOPY (EGD) WITH PROPOFOL;  Surgeon: Jaynie Collins, DO;  Location: [facility] ENDOSCOPY;  Service: Gastroenterology;  Laterality: N/A;   PULMONARY THROMBECTOMY Bilateral 02/25/2022   Procedure: PULMONARY THROMBECTOMY;  Surgeon: Annice Needy, MD;  Location: [facility] INVASIVE CV LAB;  Service: Cardiovascular;  Laterality: Bilateral;   TUBAL LIGATION       Social History:   reports that she has never smoked. She has never used smokeless tobacco. She reports current alcohol use. She reports that she does not use drugs.   Family History:  Her family history is not on file.   Allergies Allergies  Allergen Reactions   Erythromycin Hives and Nausea And Vomiting   Lexapro [Escitalopram Oxalate] Hives     Home Medications  Prior to Admission medications   Medication Sig Start Date End Date Taking? Authorizing Provider  cyanocobalamin 1000 MCG tablet Take by mouth.   Yes  [provider]  DULoxetine (CYMBALTA) 30 MG capsule Take 30 mg by mouth daily. 06/25/21  Yes [provider]  gabapentin (NEURONTIN) 400 MG capsule Take Gabapentin 400 mg in the morning, 400 mg in the afternoon, and 800 mg at night 12/29/21  Yes [provider]  omeprazole (PRILOSEC) 20 MG capsule Take 20 mg by mouth  daily. 06/10/21  Yes [provider]  propranolol (INDERAL) 20 MG tablet Take 1 tablet by mouth 2 (two) times daily. 12/16/21  Yes [provider]  rOPINIRole (REQUIP) 0.25 MG tablet Take 0.25 mg at night for 1 week, then increase to 0.5 mg at night 05/29/21  Yes [provider]  tolterodine (DETROL LA) 2 MG 24 hr capsule Take 2 mg by mouth 2 (two) times daily. 12/29/21 12/29/22 Yes [provider]  ascorbic acid (VITAMIN C) 500 MG tablet Take by mouth.    [provider]  Cholecalciferol 50 MCG (2000 UT) CAPS Take by mouth.    [provider]  cyclobenzaprine (FLEXERIL) 10 MG tablet Take 10 mg by mouth 2 (two) times daily as needed. 11/26/21   [provider]  dalfampridine 10 MG TB12 Take 1 tablet by mouth every 12 (twelve) hours. 12/29/21   [provider]  meloxicam (MOBIC) 15 MG tablet Take 1 tablet (15 mg total) by mouth daily. 08/23/21 08/23/22  Cuthriell, Delorise Royals, PA-C  methylPREDNISolone (MEDROL DOSEPAK) 4 MG TBPK tablet Take by mouth. 11/26/21   [provider]  naproxen (NAPROSYN) 500 MG tablet Take by mouth.    [provider]  nitrofurantoin, macrocrystal-monohydrate, (MACROBID) 100 MG capsule Take 1 capsule (100 mg total) by mouth 2 (two) times daily. 01/19/22   Immordino, Jeannett Senior, FNP  pregabalin (LYRICA) 75 MG capsule Take 75 mg by mouth 3 (three) times daily. 08/18/21   [provider]  traMADol (ULTRAM) 50 MG tablet Take 50 mg by mouth 3 (three) times daily as needed. 12/26/21   [provider]     Critical care time:  I have personally spent  ____50______ minutes of critical care time, exclusive of time spent on any  procedures, in evaluation and management of this critically ill patient's condition.    Tyniesha Howald MD ([facility]) FOR [facility]

## 2022-03-01 NOTE — Consult Note (Signed)
ANTICOAGULATION CONSULT NOTE  Pharmacy Consult for Heparin Drip Indication: pulmonary embolus  Patient Measurements: Height: 5\' 2"  (157.5 cm) Weight: 72.8 kg (160 lb 7.9 oz) IBW/kg (Calculated) : 50.1 Heparin Dosing Weight: 57.2 kg  Labs: Recent Labs    02/27/22 0421 02/27/22 2311 02/28/22 0727 02/28/22 1505 02/28/22 2057 03/01/22 0501  HGB 8.3*  --  8.8*  --   --  8.6*  HCT 25.6*  --  28.0*  --   --  28.3*  PLT 145*  --  167  --   --  183  HEPARINUNFRC  --    < > 0.19* 0.30 0.26* 0.38  CREATININE 0.50  --  0.43*  --   --  0.42*   < > = values in this interval not displayed.     Estimated Creatinine Clearance: 71.6 mL/min (A) (by C-G formula based on SCr of 0.42 mg/dL (L)).   Medical History: Past Medical History:  Diagnosis Date   GERD (gastroesophageal reflux disease)    Multiple sclerosis (HCC)     Medications:  No home anticoagulation per pharmacist review.  Assessment: 61 yo female presented to ED with SOB when lying flat and some right sided rib and shoulder pain.  Chest CT showed acute PE.  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 Heparin was held in setting of hemoptysis which has resolved  Goal of Therapy:  Heparin level 0.3-0.7 units/ml Monitor platelets by anticoagulation protocol: Yes   1117 2311 HL 0.21, subtherapeutic; 1150 un/hr 1118 0727 HL 0.19, subtherapeutic; 1250 un/hr 1118 1505 HL 0.30, therapeutic x 1 1118 2057 HL 0.26, subtherapeutic; 1350 un/hr 1119 0501 HL 0.38, therapeutic x 1  Plan:  --Continue heparin infusion rate at 1450 un/hr --Recheck HL in 6 hr to confirm --Daily CBC per protocol while on IV heparin  43, PharmD, [facility] 03/01/2022 5:55 AM

## 2022-03-02 ENCOUNTER — Inpatient Hospital Stay: Payer: Medicare Other

## 2022-03-02 DIAGNOSIS — J9601 Acute respiratory failure with hypoxia: Secondary | ICD-10-CM | POA: Diagnosis not present

## 2022-03-02 DIAGNOSIS — A419 Sepsis, unspecified organism: Secondary | ICD-10-CM

## 2022-03-02 DIAGNOSIS — R0489 Hemorrhage from other sites in respiratory passages: Secondary | ICD-10-CM

## 2022-03-02 DIAGNOSIS — J189 Pneumonia, unspecified organism: Secondary | ICD-10-CM | POA: Diagnosis not present

## 2022-03-02 DIAGNOSIS — Y95 Nosocomial condition: Secondary | ICD-10-CM

## 2022-03-02 DIAGNOSIS — I2699 Other pulmonary embolism without acute cor pulmonale: Secondary | ICD-10-CM | POA: Diagnosis not present

## 2022-03-02 LAB — RENAL FUNCTION PANEL
Albumin: 2.6 g/dL — ABNORMAL LOW (ref 3.5–5.0)
Anion gap: 9 (ref 5–15)
BUN: 28 mg/dL — ABNORMAL HIGH (ref 6–20)
CO2: 36 mmol/L — ABNORMAL HIGH (ref 22–32)
Calcium: 9.3 mg/dL (ref 8.9–10.3)
Chloride: 100 mmol/L (ref 98–111)
Creatinine, Ser: 0.4 mg/dL — ABNORMAL LOW (ref 0.44–1.00)
GFR, Estimated: >60 mL/min (ref >60)
Glucose, Bld: 131 mg/dL — ABNORMAL HIGH (ref 70–99)
Phosphorus: 3.8 mg/dL (ref 2.5–4.6)
Potassium: 4.8 mmol/L (ref 3.5–5.1)
Sodium: 145 mmol/L (ref 135–145)

## 2022-03-02 LAB — GLUCOSE, CAPILLARY
Glucose-Capillary: 154 mg/dL — ABNORMAL HIGH (ref 70–99)
Glucose-Capillary: 166 mg/dL — ABNORMAL HIGH (ref 70–99)
Glucose-Capillary: 140 mg/dL — ABNORMAL HIGH (ref 70–99)
Glucose-Capillary: 158 mg/dL — ABNORMAL HIGH (ref 70–99)
Glucose-Capillary: 124 mg/dL — ABNORMAL HIGH (ref 70–99)
Glucose-Capillary: 126 mg/dL — ABNORMAL HIGH (ref 70–99)

## 2022-03-02 LAB — CBC
HCT: 32.4 % — ABNORMAL LOW (ref 36.0–46.0)
Hemoglobin: 9.7 g/dL — ABNORMAL LOW (ref 12.0–15.0)
MCH: 28.4 pg (ref 26.0–34.0)
MCHC: 29.9 g/dL — ABNORMAL LOW (ref 30.0–36.0)
MCV: 95 fL (ref 80.0–100.0)
Platelets: 170 10*3/uL (ref 150–400)
RBC: 3.41 MIL/uL — ABNORMAL LOW (ref 3.87–5.11)
RDW: 19 % — ABNORMAL HIGH (ref 11.5–15.5)
WBC: 24.5 10*3/uL — ABNORMAL HIGH (ref 4.0–10.5)
nRBC: 0.7 % — ABNORMAL HIGH (ref 0.0–0.2)

## 2022-03-02 LAB — CYCLIC CITRUL PEPTIDE ANTIBODY, IGG/IGA: CCP Antibodies IgG/IgA: <1 units (ref 0–19)

## 2022-03-02 LAB — PROCALCITONIN: Procalcitonin: 0.68 ng/mL

## 2022-03-02 LAB — RHEUMATOID FACTOR: Rheumatoid fact SerPl-aCnc: 16.7 IU/mL — ABNORMAL HIGH (ref <14.0)

## 2022-03-02 MED ORDER — PROPRANOLOL HCL 20 MG PO TABS
10.0000 mg | ORAL_TABLET | Freq: Two times a day (BID) | ORAL | Status: DC
  Administered 2022-03-02 – 2022-03-03 (×3): 10 mg
  Filled 2022-03-02 (×4): qty 1

## 2022-03-02 MED ORDER — PIPERACILLIN-TAZOBACTAM 3.375 G IVPB
3.3750 g | Freq: Three times a day (TID) | INTRAVENOUS | Status: DC
  Administered 2022-03-02 – 2022-03-08 (×20): 3.375 g via INTRAVENOUS
  Filled 2022-03-02 (×20): qty 50

## 2022-03-02 MED ORDER — POLYETHYLENE GLYCOL 3350 17 G PO PACK
17.0000 g | PACK | Freq: Once | ORAL | Status: AC
  Administered 2022-03-02: 17 g
  Filled 2022-03-02: qty 1

## 2022-03-02 MED ORDER — FREE WATER
100.0000 mL | Status: DC
  Administered 2022-03-02 – 2022-03-07 (×32): 100 mL

## 2022-03-02 MED ORDER — IPRATROPIUM-ALBUTEROL 0.5-2.5 (3) MG/3ML IN SOLN
3.0000 mL | Freq: Four times a day (QID) | RESPIRATORY_TRACT | Status: DC
  Administered 2022-03-03 – 2022-03-11 (×31): 3 mL via RESPIRATORY_TRACT
  Filled 2022-03-02 (×31): qty 3

## 2022-03-02 NOTE — Progress Notes (Signed)
NAME:  [name], MRN:  [id number], DOB:  [birth year], LOS: 6 ADMISSION DATE:  02/24/2022, CONSULTATION DATE:  02/25/2022 REFERRING MD:  Dr. Lucky Cowboy, CHIEF COMPLAINT:  Acute Respiratory Distress & hypoxia, development of Hemoptysis post thrombectomy  Brief Pt Description / Synopsis:  61 y.o. female admitted with Acute Hypoxic Respiratory Failure in setting of Bilateral Pulmonary Embolism.  Underwent thrombectomy with Vascular Surgery, post procedure developed Hemoptysis leading to severe blockage of airways requiring emergent intubation. S/p bronch with therapeutic aspiration of blood clots from tracheobronchial tree.  History of Present Illness:  [name] is a 61 year old female with history of hypertension, depression, anxiety, restless leg syndrome, GERD, who presents emergency department for chief concerns of shortness of breath via POV.  Pt is currently intubated and sedation and unable to contribute to history, therefore history is obtained from chart review.   Per ED and nursing notes, she reported that she developed shortness of breath 3 to 4 days ago.  She denies known fever at home and trauma to her person.  She denies chest pain, abdominal pain, dysuria, hematuria.  She did have 1 episode of diarrhea today.  She denies blood in her stool.  She endorses having COVID about 4 weeks ago.   She denies recent international travel or long car trips.  She denies history of cancer that she knows of.  She denies recent surgery.   She denies changes to her leg pain.  She states at baseline she has leg pain because of MS and restless leg syndrome.  ED Course: Initial Vital Signs:  temperature of 99.1, respiration rate of 20, heart rate of 140, blood pressure 124/59, SPO2 of 94% on room air.  Significant Labs: Serum sodium is 138, potassium 2.7, chloride 106, bicarb 23, BUN of 14, serum creatinine 0.62, EGFR greater than 60, nonfasting blood glucose 106, WBC 15.7, hemoglobin  9.3, platelets of 231.  Imaging CTA PE: Was read as acute PE with moderate thrombus burden involving both lungs.  RV-LV ratio is 1.3 suggesting acute or chronic right heart dysfunction.  Small right pleural effusion.  Patchy infiltrates in the perihilar regions of both lung fields, right more than left.  Moderate to large size fixed hiatal hernia.  Bilateral lower extremity venous doppler ultrasounds>>  occlusive thrombus in both peroneal veins.  Medications Administered: Ceftriaxone 2 g IV, vancomycin, LR 150 mL bolus, doxycycline.   Hospitalist were asked to admit for further workup and treatment.  Vascular Surgery was consulted with plan for Thrombectomy.  Please see "significant hospital events" section below for full detailed hospital course.    Pertinent  Medical History   Past Medical History:  Diagnosis Date   GERD (gastroesophageal reflux disease)    Multiple sclerosis ([place])      Micro Data:  11/14: Blood culture x2>>NGTD 11/15: MRSA PCR>>negative 11/19: Tracheal aspirate>>  Antimicrobials:  Ceftriaxone 11/14>>11/14 Doxycycline 11/14>>11/14 Vancomycin 11/14>>11/14 Unasyn 11/15>>11/19 Zosyn 11/20>>  Significant Hospital Events: Including procedures, antibiotic start and stop dates in addition to other pertinent events   11/14: Admitted by Hospitalist.  Vascular Surgery consulted 11/15:  Thrombectomy performed, massive amounts of clot removed.  Post procedure developed Acute Hemoptysis requiring emergent intubation and transfer to ICU.  PCCM consulted, plan for emergent Bronchoscopy 11/15 3 hour BRONCH to extract clots 11/16  2 units PRBC's to be given.  Repeat Bronch. 11/17: Off pressors. Increasing FiO2 requirements (60% FiO2, 5 peep), CXR with increase in bilateral opacities concerning for PNA.  NO SBT today. Place OG  and start feeds. 11/18: Heparin gtt restarted yesterday with no bolus, no hemoptysis reported and Hgb remains stable.  Slow improvement in FiO2 to 60%  (from 75%) with diuresis yesterday, renal function remains normal so will diurese again today 11/19: Still with high vent requirements (60% FiO2, 10 PEEP), Heparin gtt d/c due to bmall volume bright red blood from ETT.  Collagen Vascular workup sent.  Holding diuresis due to increased Na+ & BUN.  Solumedrol increased to 40 mg BID 11/20: Slight improvement in vent requirements (50% FiO2, 8 PEEP).  Worsening Leukocytosis, tracheal aspirate results pending, start Zosyn.  Interim History / Subjective:  -No acute events noted overnight -Afebrile, hemodynamically stable, no vasopressors -Decrease in FiO2 to 50% (from 60%), Decrease to 8 PEEP (was 10 PEEP) -Small amounts of old blood from ETT after Heparin d/c yesterday (was bright red) ~ continuing to hold Heparin for now -Leukocytosis worsened to 24.5 from 19.8, Tracheal aspirate pending ~ ABX changed to Zosyn -Vasculitis workup is pending   Objective   Blood pressure 119/62, pulse (!) 117, temperature 99.1 F (37.3 C), temperature source Axillary, resp. rate 15, height _0  (1.575 m), weight 72.9 kg, SpO2 93 %.    Vent Mode: PRVC FiO2 (%):  [60 %] 60 % Set Rate:  [20 bmp] 20 bmp Vt Set:  [400 mL] 400 mL PEEP:  [10 cmH20] 10 cmH20 Plateau Pressure:  [19 cmH20] 19 cmH20   Intake/Output Summary (Last 24 hours) at 03/02/2022 0754 Last data filed at 03/02/2022 0700 Gross per 24 hour  Intake 2055.72 ml  Output 2015 ml  Net 40.72 ml    Filed Weights   02/25/22 0400 02/28/22 0500 03/02/22 0500  Weight: 68.4 kg 72.8 kg 72.9 kg     REVIEW OF SYSTEMS PATIENT IS UNABLE TO PROVIDE COMPLETE REVIEW OF SYSTEMS DUE TO SEVERE CRITICAL ILLNESS   PHYSICAL EXAMINATION:  GENERAL:critically ill appearing female, laying in bed, intubated and lightly sedated, NAD  EYES: Pupils equal, round, reactive to light.  No scleral icterus.  MOUTH: Moist mucosal membrane. Orally INTUBATED NECK: Supple. No JVD PULMONARY: Coarse to auscultation throughout,  even, occasionally overbreathing the vent CARDIOVASCULAR: S1 and S2.  Tachycardia, Regular rhythm GASTROINTESTINAL: Soft, nontender, -distended. Positive bowel sounds.  MUSCULOSKELETAL: No swelling, clubbing, or edema.  NEUROLOGIC: Sedated, withdraws from pain, currently not following commands, pupils PERRL SKIN:normal, warm to touch,  Pulses present bilaterally  Resolved Hospital Problem list     Assessment & Plan:    Severe ACUTE Hypoxic Respiratory Failure in setting of Bilateral Pulmonary Embolism, Hemoptysis, presumed Aspiration/Pneumonia & Pulmonary Edema S/p Bronchoscopy x2 -Full vent support, implement lung protective strategies -Plateau pressures less than 30 cm H20 -Wean FiO2 & PEEP as tolerated to maintain O2 sats >92% -Follow intermittent Chest X-ray & ABG as needed -Spontaneous Breathing Trials when respiratory parameters met and mental status permits -Implement VAP Bundle -Bronchodilators & Pulmicort nebs -IV Steroids (Solumedrol 40 mg BID) -ABX as above -Aggressive pulmonary toilet as able -Discussed with Dr. Genia Harold 11/20 ~ continue to hold Heparin gtt as pt developed bright red blood 11/19 after Heparin was resumed -Vasculitis workup is pending  -Diuresis as BP and renal function permits  Bilateral Pulmonary Embolism & Bilateral peroneal lower extremity thrombus, suspect in setting of recent COVID-19 infection about 3 weeks prior Hypotension: Related to sedation +/- acute blood loss with hemoptysis +/- sepsis from aspiration/pneumonia Echocardiogram 02/24/22: LVEF 15-40%, normal diastolic parameters, RV size and systolic function normal S/p Thrombectomy 11/15 -Continuous cardiac monitoring -Maintain  MAP >65 -Vasopressors as needed to maintain MAP goal ~ currently not requiring -Continue Midodrine -HS Troponin negative -Discussed with Dr. Genia Harold 11/20 ~ continue to hold Heparin gtt as pt developed bright red blood 11/19 after Heparin was resumed -Vascular  Surgery following, appreciate input  Presumed Aspiration/Pneumonia -Monitor fever curve -Trend WBC's & Procalcitonin -Follow cultures as above -Continue empiric Zosyn pending cultures & sensitivities  Sedation needs in setting of mechanical ventilation PMHx: MS -Maintain a RASS goal of 0 to -1 -Fentanyl and Precedex as needed to maintain RASS goal -Avoid sedating medications as able -Daily wake up assessment  Hyperglycemia -CBG's q4h; Target range of 140 to 180 -SSI -Follow ICU Hypo/Hyperglycemia protocol      Pt is critically ill.  High risk for decompensation, cardiac arrest, and death.   Best Practice (right click and "Reselect all SmartList Selections" daily)   Diet/type: NPO,  tube feeds DVT prophylaxis: SCD GI prophylaxis: PPI Lines: N/A Foley:  yes, and is still needed Code Status:  full code  11/20: Pt's sister updated at bedside.  Labs   CBC: Recent Labs  Lab 02/26/22 0307 02/26/22 1014 02/26/22 1512 02/27/22 0421 02/28/22 0727 03/01/22 0501 03/02/22 0647  WBC 30.3*  --   --  17.5* 22.9* 19.8* 24.5*  NEUTROABS  --   --   --  14.1*  --   --   --   HGB 6.6*   < > 8.4* 8.3* 8.8* 8.6* 9.7*  HCT 21.2*   < > 25.2* 25.6* 28.0* 28.3* 32.4*  MCV 93.0  --   --  89.2 92.1 94.6 95.0  PLT 187  --   --  145* 167 183 170   < > = values in this interval not displayed.     Basic Metabolic Panel: Recent Labs  Lab 02/26/22 0051 02/26/22 0307 02/27/22 0421 02/28/22 0727 03/01/22 0501 03/02/22 0647  NA 140 139 144 145 147* 145  K 4.1 4.1 3.9 4.2 4.4 4.8  CL 111 109 111 107 104 100  CO2 _0 38* 36*  GLUCOSE 104* 123* 156* 145* 128* 131*  BUN _1 31* 28*  CREATININE 0.72 0.68 0.50 0.43* 0.42* 0.40*  CALCIUM 7.1* 7.7* 8.7* 9.1 9.5 9.3  MG 1.7 1.8 2.5* 2.2  --   --   PHOS 2.6 3.0 1.8* 2.3* 2.3* 3.8    GFR: Estimated Creatinine Clearance: 71.6 mL/min (A) (by C-G formula based on SCr of 0.4 mg/dL (L)). Recent Labs  Lab 02/24/22 1854  02/24/22 2056 02/25/22 0258 02/26/22 0307 02/27/22 0421 02/28/22 0726 02/28/22 0727 03/01/22 0501 03/02/22 0647  PROCALCITON 0.23  --   --   --   --  1.61  --  1.15  --   WBC  --   --    < > 30.3* 17.5*  --  22.9* 19.8* 24.5*  LATICACIDVEN 1.9 1.0  --  1.5  --   --   --   --   --    < > = values in this interval not displayed.     Liver Function Tests: Recent Labs  Lab 02/26/22 0307 02/28/22 0727 03/01/22 0501 03/02/22 0647  AST 17  --   --   --   ALT 17  --   --   --   ALKPHOS 96  --   --   --   BILITOT 1.3*  --   --   --   PROT 5.8*  --   --   --  ALBUMIN 2.5* 2.9* 2.6* 2.6*    No results for input(s): "LIPASE", "AMYLASE" in the last 168 hours. No results for input(s): "AMMONIA" in the last 168 hours.  ABG    Component Value Date/Time   PHART 7.25 (L) 02/25/2022 2054   PCO2ART 57 (H) 02/25/2022 2054   PO2ART 89 02/25/2022 2054   HCO3 22.6 02/26/2022 0307   ACIDBASEDEF 4.0 (H) 02/26/2022 0307   O2SAT 59.3 02/26/2022 0307     Coagulation Profile: Home Medications  Prior to Admission medications   Medication Sig Start Date End Date Taking? Authorizing Provider  cyanocobalamin 1000 MCG tablet Take by mouth.   Yes [provider]  DULoxetine (CYMBALTA) 30 MG capsule Take 30 mg by mouth daily. 06/25/21  Yes [provider]  gabapentin (NEURONTIN) 400 MG capsule Take Gabapentin 400 mg in the morning, 400 mg in the afternoon, and 800 mg at night 12/29/21  Yes [provider]  omeprazole (PRILOSEC) 20 MG capsule Take 20 mg by mouth daily. 06/10/21  Yes [provider]  propranolol (INDERAL) 20 MG tablet Take 1 tablet by mouth 2 (two) times daily. 12/16/21  Yes [provider]  rOPINIRole (REQUIP) 0.25 MG tablet Take 0.25 mg at night for 1 week, then increase to 0.5 mg at night 05/29/21  Yes [provider]  tolterodine (DETROL LA) 2 MG 24 hr capsule Take 2 mg by mouth 2 (two) times daily. 12/29/21 12/29/22 Yes [provider]  ascorbic acid (VITAMIN C) 500 MG tablet Take by mouth.    [provider]  Cholecalciferol 50 MCG (2000 UT) CAPS Take by mouth.    [provider]  cyclobenzaprine (FLEXERIL) 10 MG tablet Take 10 mg by mouth 2 (two) times daily as needed. 11/26/21   [provider]  dalfampridine 10 MG TB12 Take 1 tablet by mouth every 12 (twelve) hours. 12/29/21   [provider]  meloxicam (MOBIC) 15 MG tablet Take 1 tablet (15 mg total) by mouth daily. 08/23/21 08/23/22  Cuthriell, Charline Bills, PA-C  methylPREDNISolone (MEDROL DOSEPAK) 4 MG TBPK tablet Take by mouth. 11/26/21   [provider]  naproxen (NAPROSYN) 500 MG tablet Take by mouth.    [provider]  nitrofurantoin, macrocrystal-monohydrate, (MACROBID) 100 MG capsule Take 1 capsule (100 mg total) by mouth 2 (two) times daily. 01/19/22   Immordino, Annie Main, FNP  pregabalin (LYRICA) 75 MG capsule Take 75 mg by mouth 3 (three) times daily. 08/18/21   [provider]  traMADol (ULTRAM) 50 MG tablet Take 50 mg by mouth 3 (three) times daily as needed. 12/26/21   [provider]        Critical Care Time: 40 minutes   Darel Hong, AGACNP-BC [facility] Prefer epic messenger for cross cover needs If after hours, please call E-link

## 2022-03-02 NOTE — Consult Note (Signed)
Pharmacy Antibiotic Note  [name] is a 61 yo female w/ h/o HTN, MDD/anxiety, RLS, GERD presenting to the ED with SOB when lying flat and some right sided rib and shoulder pain.  Chest CT showed acute PE & pt found to have bilateral DVTs ISO recent covid pneumonia 3wks prior.  Pharmacy has been consulted for Zosyn dosing d/t c/f aspiration pneumonia  Plan: Zosyn 3.375g IV q8h   Height: 5\' 2"  (157.5 cm) Weight: 72.9 kg (160 lb 11.5 oz) IBW/kg (Calculated) : 50.1  Temp (24hrs), Avg:98.9 F (37.2 C), Min:98.5 F (36.9 C), Max:99.2 F (37.3 C)  Recent Labs  Lab 02/24/22 1854 02/24/22 2056 02/25/22 0258 02/26/22 0307 02/27/22 0421 02/28/22 0727 03/01/22 0501 03/02/22 0647  WBC  --   --    < > 30.3* 17.5* 22.9* 19.8* 24.5*  CREATININE  --   --    < > 0.68 0.50 0.43* 0.42* 0.40*  LATICACIDVEN 1.9 1.0  --  1.5  --   --   --   --    < > = values in this interval not displayed.    Estimated Creatinine Clearance: 71.6 mL/min (A) (by C-G formula based on SCr of 0.4 mg/dL (L)).    Allergies  Allergen Reactions   Erythromycin Hives and Nausea And Vomiting   Lexapro [Escitalopram Oxalate] Hives    Antimicrobials this admission: Zosyn (11/20 >>   Dose adjustments this admission: CTM and adjust PRN.   Microbiology results: 11/14 BCx: NGTD (FINAL) 11/15 MRSA PCR: negative 11/19 Rcx (trach aspirate): No org's seen; few WBC   Thank you for allowing pharmacy to be a part of this patient's care.  12/19 [name] 03/02/2022 12:19 PM

## 2022-03-02 NOTE — Progress Notes (Signed)
PHARMACY CONSULT NOTE  Pharmacy Consult for Electrolyte Monitoring and Replacement   Recent Labs: Potassium (mmol/L)  Date Value  03/02/2022 4.8   Magnesium (mg/dL)  Date Value  41/96/2229 2.2   Calcium (mg/dL)  Date Value  79/89/2119 9.3   Albumin (g/dL)  Date Value  41/74/0814 2.6 (L)   Phosphorus (mg/dL)  Date Value  48/18/5631 3.8   Sodium (mmol/L)  Date Value  03/02/2022 145   Assessment: 61 yo female presented to ED with SOB when lying flat and some right sided rib and shoulder pain.  Chest CT showed acute PE.  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 Pharmacy is asked to follow and replace electrolytes while in CCU.  Nutrition: Tube feeds @50ml /h, FWF 100 q4h,   Goal of Therapy:  Electrolytes within normal limits  Plan:  Renal fxn stable & lytes WNL, no repletion at this time. --Na 147>145, defer management to primary team --Phos 2.3>3.8: after repletion with Phos-Nak 2 packets per tube x 1 dose on 11/19 --Follow-up electrolytes with AM labs tomorrow  12/19 03/02/2022 7:52 AM

## 2022-03-03 ENCOUNTER — Inpatient Hospital Stay: Payer: Medicare Other

## 2022-03-03 DIAGNOSIS — J189 Pneumonia, unspecified organism: Secondary | ICD-10-CM | POA: Diagnosis not present

## 2022-03-03 DIAGNOSIS — I2699 Other pulmonary embolism without acute cor pulmonale: Secondary | ICD-10-CM | POA: Diagnosis not present

## 2022-03-03 DIAGNOSIS — J9601 Acute respiratory failure with hypoxia: Secondary | ICD-10-CM | POA: Diagnosis not present

## 2022-03-03 DIAGNOSIS — A419 Sepsis, unspecified organism: Secondary | ICD-10-CM | POA: Diagnosis not present

## 2022-03-03 LAB — HEPATIC FUNCTION PANEL
ALT: 44 U/L (ref 0–44)
AST: 36 U/L (ref 15–41)
Albumin: 2.7 g/dL — ABNORMAL LOW (ref 3.5–5.0)
Alkaline Phosphatase: 119 U/L (ref 38–126)
Bilirubin, Direct: 0.3 mg/dL — ABNORMAL HIGH (ref 0.0–0.2)
Indirect Bilirubin: 0.7 mg/dL (ref 0.3–0.9)
Total Bilirubin: 1 mg/dL (ref 0.3–1.2)
Total Protein: 5.4 g/dL — ABNORMAL LOW (ref 6.5–8.1)

## 2022-03-03 LAB — CBC
HCT: 34.8 % — ABNORMAL LOW (ref 36.0–46.0)
Hemoglobin: 10.7 g/dL — ABNORMAL LOW (ref 12.0–15.0)
MCH: 29.2 pg (ref 26.0–34.0)
MCHC: 30.7 g/dL (ref 30.0–36.0)
MCV: 94.8 fL (ref 80.0–100.0)
Platelets: 159 10*3/uL (ref 150–400)
RBC: 3.67 MIL/uL — ABNORMAL LOW (ref 3.87–5.11)
RDW: 19.2 % — ABNORMAL HIGH (ref 11.5–15.5)
WBC: 32.3 10*3/uL — ABNORMAL HIGH (ref 4.0–10.5)
nRBC: 1.7 % — ABNORMAL HIGH (ref 0.0–0.2)

## 2022-03-03 LAB — ANA: Anti Nuclear Antibody (ANA): NEGATIVE

## 2022-03-03 LAB — BASIC METABOLIC PANEL
Anion gap: 7 (ref 5–15)
BUN: 35 mg/dL — ABNORMAL HIGH (ref 6–20)
CO2: 38 mmol/L — ABNORMAL HIGH (ref 22–32)
Calcium: 9.1 mg/dL (ref 8.9–10.3)
Chloride: 99 mmol/L (ref 98–111)
Creatinine, Ser: 0.47 mg/dL (ref 0.44–1.00)
GFR, Estimated: >60 mL/min (ref >60)
Glucose, Bld: 123 mg/dL — ABNORMAL HIGH (ref 70–99)
Potassium: 4.2 mmol/L (ref 3.5–5.1)
Sodium: 144 mmol/L (ref 135–145)

## 2022-03-03 LAB — CBC WITH DIFFERENTIAL/PLATELET
Abs Immature Granulocytes: 8.96 10*3/uL — ABNORMAL HIGH (ref 0.00–0.07)
Basophils Absolute: 0.1 10*3/uL (ref 0.0–0.1)
Basophils Relative: 0 %
Eosinophils Absolute: 0.1 10*3/uL (ref 0.0–0.5)
Eosinophils Relative: 0 %
HCT: 35.6 % — ABNORMAL LOW (ref 36.0–46.0)
Hemoglobin: 10.9 g/dL — ABNORMAL LOW (ref 12.0–15.0)
Immature Granulocytes: 27 %
Lymphocytes Relative: 4 %
Lymphs Abs: 1.2 10*3/uL (ref 0.7–4.0)
MCH: 29.1 pg (ref 26.0–34.0)
MCHC: 30.6 g/dL (ref 30.0–36.0)
MCV: 95.2 fL (ref 80.0–100.0)
Monocytes Absolute: 4.7 10*3/uL — ABNORMAL HIGH (ref 0.1–1.0)
Monocytes Relative: 14 %
Neutro Abs: 17.7 10*3/uL — ABNORMAL HIGH (ref 1.7–7.7)
Neutrophils Relative %: 55 %
Platelets: 135 10*3/uL — ABNORMAL LOW (ref 150–400)
RBC: 3.74 MIL/uL — ABNORMAL LOW (ref 3.87–5.11)
RDW: 19.2 % — ABNORMAL HIGH (ref 11.5–15.5)
Smear Review: NORMAL
WBC Morphology: ABNORMAL
WBC: 32.8 10*3/uL — ABNORMAL HIGH (ref 4.0–10.5)
nRBC: 1.6 % — ABNORMAL HIGH (ref 0.0–0.2)

## 2022-03-03 LAB — GLUCOSE, CAPILLARY
Glucose-Capillary: 98 mg/dL (ref 70–99)
Glucose-Capillary: 135 mg/dL — ABNORMAL HIGH (ref 70–99)
Glucose-Capillary: 161 mg/dL — ABNORMAL HIGH (ref 70–99)
Glucose-Capillary: 124 mg/dL — ABNORMAL HIGH (ref 70–99)
Glucose-Capillary: 109 mg/dL — ABNORMAL HIGH (ref 70–99)
Glucose-Capillary: 115 mg/dL — ABNORMAL HIGH (ref 70–99)

## 2022-03-03 LAB — BLOOD GAS, ARTERIAL
Acid-Base Excess: 14.4 mmol/L — ABNORMAL HIGH (ref 0.0–2.0)
Bicarbonate: 38.5 mmol/L — ABNORMAL HIGH (ref 20.0–28.0)
FIO2: 45 %
MECHVT: 440 mL
Mechanical Rate: 20
O2 Saturation: 93.9 %
PEEP: 5 cmH2O
Patient temperature: 37
pCO2 arterial: 44 mmHg (ref 32–48)
pH, Arterial: 7.55 — ABNORMAL HIGH (ref 7.35–7.45)
pO2, Arterial: 62 mmHg — ABNORMAL LOW (ref 83–108)

## 2022-03-03 LAB — HEPARIN LEVEL (UNFRACTIONATED)
Heparin Unfractionated: <0.1 IU/mL — ABNORMAL LOW (ref 0.30–0.70)
Heparin Unfractionated: 0.51 IU/mL (ref 0.30–0.70)

## 2022-03-03 LAB — MAGNESIUM: Magnesium: 2.2 mg/dL (ref 1.7–2.4)

## 2022-03-03 LAB — ANCA PROFILE
Anti-MPO Antibodies: <0.2 units (ref 0.0–0.9)
Anti-PR3 Antibodies: <0.2 units (ref 0.0–0.9)
Atypical P-ANCA titer: <1:20 {titer}
C-ANCA: <1:20 {titer}
P-ANCA: <1:20 {titer}

## 2022-03-03 LAB — PHOSPHORUS: Phosphorus: 3.5 mg/dL (ref 2.5–4.6)

## 2022-03-03 LAB — BRAIN NATRIURETIC PEPTIDE: B Natriuretic Peptide: 64 pg/mL (ref 0.0–100.0)

## 2022-03-03 LAB — LACTIC ACID, PLASMA: Lactic Acid, Venous: 1.4 mmol/L (ref 0.5–1.9)

## 2022-03-03 LAB — TRIGLYCERIDES: Triglycerides: 338 mg/dL — ABNORMAL HIGH (ref <150)

## 2022-03-03 MED ORDER — PROPOFOL 1000 MG/100ML IV EMUL
5.0000 ug/kg/min | INTRAVENOUS | Status: DC
  Administered 2022-03-03: 5 ug/kg/min via INTRAVENOUS
  Administered 2022-03-04: 20 ug/kg/min via INTRAVENOUS
  Administered 2022-03-05: 15 ug/kg/min via INTRAVENOUS
  Administered 2022-03-05: 20 ug/kg/min via INTRAVENOUS
  Administered 2022-03-06: 10 ug/kg/min via INTRAVENOUS
  Administered 2022-03-06 – 2022-03-07 (×2): 20 ug/kg/min via INTRAVENOUS
  Administered 2022-03-07: 10 ug/kg/min via INTRAVENOUS
  Administered 2022-03-07 – 2022-03-08 (×2): 35 ug/kg/min via INTRAVENOUS
  Filled 2022-03-03: qty 100
  Filled 2022-03-03: qty 200
  Filled 2022-03-03 (×9): qty 100

## 2022-03-03 MED ORDER — IBUPROFEN 100 MG/5ML PO SUSP
600.0000 mg | Freq: Once | ORAL | Status: AC
  Administered 2022-03-03: 600 mg
  Filled 2022-03-03 (×3): qty 30

## 2022-03-03 MED ORDER — HEPARIN (PORCINE) 25000 UT/250ML-% IV SOLN
1400.0000 [IU]/h | INTRAVENOUS | Status: DC
  Administered 2022-03-03: 1450 [IU]/h via INTRAVENOUS
  Filled 2022-03-03: qty 250

## 2022-03-03 MED ORDER — IOHEXOL 350 MG/ML SOLN
100.0000 mL | Freq: Once | INTRAVENOUS | Status: AC | PRN
  Administered 2022-03-03: 100 mL via INTRAVENOUS

## 2022-03-03 MED ORDER — ACETAMINOPHEN 325 MG PO TABS
650.0000 mg | ORAL_TABLET | Freq: Four times a day (QID) | ORAL | Status: DC | PRN
  Administered 2022-03-03 – 2022-03-19 (×25): 650 mg
  Filled 2022-03-03 (×26): qty 2

## 2022-03-03 MED ORDER — FENTANYL 2500MCG IN NS 250ML (10MCG/ML) PREMIX INFUSION
0.0000 ug/h | INTRAVENOUS | Status: DC
  Administered 2022-03-03: 25 ug/h via INTRAVENOUS
  Filled 2022-03-03: qty 250

## 2022-03-03 MED ORDER — FENTANYL CITRATE PF 50 MCG/ML IJ SOSY: 50.0000 ug | PREFILLED_SYRINGE | INTRAMUSCULAR | Status: DC | PRN

## 2022-03-03 MED ORDER — HYDROMORPHONE HCL-NACL 50-0.9 MG/50ML-% IV SOLN
0.5000 mg/h | INTRAVENOUS | Status: DC
  Administered 2022-03-03: 1 mg/h via INTRAVENOUS
  Administered 2022-03-04 (×2): 2 mg/h via INTRAVENOUS
  Administered 2022-03-06: 0.5 mg/h via INTRAVENOUS
  Administered 2022-03-06: 1 mg/h via INTRAVENOUS
  Filled 2022-03-03 (×4): qty 50

## 2022-03-03 MED ORDER — DEXMEDETOMIDINE HCL IN NACL 400 MCG/100ML IV SOLN
0.0000 ug/kg/h | INTRAVENOUS | Status: DC
  Administered 2022-03-03: 0.4 ug/kg/h via INTRAVENOUS
  Filled 2022-03-03: qty 100

## 2022-03-03 MED ORDER — FENTANYL BOLUS VIA INFUSION
50.0000 ug | INTRAVENOUS | Status: DC | PRN
  Administered 2022-03-03: 50 ug via INTRAVENOUS

## 2022-03-03 MED ORDER — FENTANYL CITRATE PF 50 MCG/ML IJ SOSY
50.0000 ug | PREFILLED_SYRINGE | INTRAMUSCULAR | Status: DC | PRN
  Administered 2022-03-03: 50 ug via INTRAVENOUS
  Filled 2022-03-03: qty 1

## 2022-03-03 MED ORDER — HYDROMORPHONE HCL 1 MG/ML IJ SOLN
1.0000 mg | Freq: Once | INTRAMUSCULAR | Status: AC
  Administered 2022-03-03: 1 mg via INTRAVENOUS
  Filled 2022-03-03: qty 1

## 2022-03-03 MED ORDER — HYDROMORPHONE BOLUS VIA INFUSION
0.2500 mg | INTRAVENOUS | Status: DC | PRN
  Administered 2022-03-05: 0.25 mg via INTRAVENOUS
  Administered 2022-03-05: 0.5 mg via INTRAVENOUS
  Administered 2022-03-07: 0.25 mg via INTRAVENOUS

## 2022-03-03 NOTE — Progress Notes (Signed)
RT note:  Attempted sputum collection, however, no secretions present.  Will pass along to next shift.

## 2022-03-03 NOTE — Consult Note (Signed)
ANTICOAGULATION CONSULT NOTE  Pharmacy Consult for Heparin Drip Indication: pulmonary embolus  Patient Measurements: Height: 5\' 2"  (157.5 cm) Weight: 72.9 kg (160 lb 11.5 oz) IBW/kg (Calculated) : 50.1 Heparin Dosing Weight: 57.2 kg  Labs: Recent Labs    03/01/22 0501 03/01/22 0816 03/02/22 0647 03/03/22 0406 03/03/22 1240 03/03/22 1813  HGB 8.6*  --  9.7* 10.7*  --   --   HCT 28.3*  --  32.4* 34.8*  --   --   PLT 183  --  170 159  --   --   HEPARINUNFRC 0.38 0.30  --   --  <0.10* 0.51  CREATININE 0.42*  --  0.40* 0.47  --   --     Estimated Creatinine Clearance: 71.6 mL/min (by C-G formula based on SCr of 0.47 mg/dL).  Medical History: Past Medical History:  Diagnosis Date   GERD (gastroesophageal reflux disease)    Multiple sclerosis (HCC)   Heparin Dosing Weight: 57.2 kg  Medications:  No home anticoagulation per pharmacist review.  Assessment: 61 yo female presented to ED with SOB when lying flat and some right sided rib and shoulder pain.  Chest CT showed acute PE.  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 Heparin was held in setting of hemoptysis on 11/19 .   Hgb 9.3-->6.6>(s/p 2u PRBC)>8.4>8.8>9.7, PLT wnl --s/p thrombectomy 11/15  Goal of Therapy:  Heparin level 0.3-0.5 units/ml (NO BOLUSES) Monitor platelets by anticoagulation protocol: Yes   Date Time aPTT/HL Rate/Comment 1117  2311  0.21  subtherapeutic; 1150 un/hr 1118 0727  0.19  subtherapeutic; 1250 un/hr 1118 1505  0.30  therapeutic x 1 1118 2057  0.26  subtherapeutic; 1350 un/hr 1119 0501  0.38  therapeutic x 1 1119 0816  0.30  therapeutic x 2 1450 un/hr 1121 1813 0.51  Supratherapeutic; 1450 un/hr  Plan:  Change heparin infusion rate to 1400 un/hr Recheck HL in 6hrs after heparin gtt rate change CMT Daily CBC per protocol while on IV heparin --Follow-up transition to oral anticoagulation  1118  03/03/2022 6:49 PM

## 2022-03-03 NOTE — Progress Notes (Signed)
0809- RT at bedside and pt into pressure support. Sedation stopped at this time for spontaneous awakening trial.   0929- Fentanyl restarted for tachypnea. RT updated, pt to be placed on rate control.   1030- Rounding complete - plan to wean Fentanyl off and start Precedex. Heparin to be restarted.   1200-Dr Dgayli given update on pt condition - temperature is now 102.5 F. Plan for repeat blood cultures. Pt continued tachycardia/tachypnea.  1415-Urine output looking cloudy with sediment, Erasmo Downer, NP to bedside for update. Plan to remove foley and place external catheter. Continued tachycardia/tachypnea.  1445-Dr Dgayli given update on pt condition, pt continued tachycardia/tachypnea after increasing Precedex. Plan to restart Fentanyl drip.  1600-Dr Dgayli given update on pt condition. Continued tachypnea - plan to d/c Precedex and start Propofol.   1640-RT called d/t continued desaturation. O2 breaths given x2 without improvement. RT come to bedside for evaluation and increase FiO2 if indicated.  1835- Ice packs placed for temperature of 103 F. Dr Aundria Rud updated on pt condition. Continued tachycardia, tachypnea - PRN medications given, as well as increasing sedation. New orders placed.

## 2022-03-03 NOTE — Progress Notes (Signed)
NAME:  [name], MRN:  [id number], DOB:  08/29/1960, LOS: 7 ADMISSION DATE:  02/24/2022, CHIEF COMPLAINT:  hypoxic respiratory failure   History of Present Illness:  61 y.o. female admitted with Acute Hypoxic Respiratory Failure in setting of Bilateral Pulmonary Embolism.  Underwent thrombectomy with Vascular Surgery, post procedure developed Hemoptysis leading to severe blockage of airways requiring emergent intubation. S/p bronch with therapeutic aspiration of blood clots from tracheobronchial tree.  CTA PE: Was read as acute PE with moderate thrombus burden involving both lungs. Bilateral lower extremity venous doppler ultrasounds>>  occlusive thrombus in both peroneal veins.    Pertinent  Medical History  -GERD -MS  Significant Hospital Events: Including procedures, antibiotic start and stop dates in addition to other pertinent events   11/14: Admitted by Hospitalist.  Vascular Surgery consulted 11/15:  Thrombectomy performed, massive amounts of clot removed.  Post procedure developed Acute Hemoptysis requiring emergent intubation and transfer to ICU.  PCCM consulted, plan for emergent Bronchoscopy 11/15 3 hour BRONCH to extract clots 11/16  2 units PRBC's to be given.  Repeat Bronch. 11/17: Off pressors. Increasing FiO2 requirements (60% FiO2, 5 peep), CXR with increase in bilateral opacities concerning for PNA.  NO SBT today. Place OG and start feeds. 11/18: Heparin gtt restarted yesterday with no bolus, no hemoptysis reported and Hgb remains stable.  Slow improvement in FiO2 to 60% (from 75%) with diuresis yesterday, renal function remains normal so will diurese again today 11/19: Still with high vent requirements (60% FiO2, 10 PEEP), Heparin gtt d/c due to bmall volume bright red blood from ETT.  [facility] workup sent.  Holding diuresis due to increased Na+ & BUN.  Solumedrol increased to 40 mg BID 11/20: Slight improvement in vent requirements (50% FiO2, 8 PEEP).   Worsening Leukocytosis, tracheal aspirate results pending, start Zosyn.  Interim History / Subjective:  No acute events overnight.  Objective   Blood pressure 129/76, pulse (!) 122, temperature (!) 102.5 F (39.2 C), temperature source Axillary, resp. rate (!) 34, height 5\' 2"  (1.575 m), weight 72.9 kg, SpO2 92 %.    Vent Mode: PRVC FiO2 (%):  [45 %-50 %] 45 % Set Rate:  [20 bmp] 20 bmp Vt Set:  [400 mL] 400 mL PEEP:  [8 cmH20] 8 cmH20 Pressure Support:  [8 cmH20] 8 cmH20 Plateau Pressure:  [12 cmH20-18 cmH20] 12 cmH20   Intake/Output Summary (Last 24 hours) at 03/03/2022 1313 Last data filed at 03/03/2022 1223 Gross per 24 hour  Intake 2510.9 ml  Output 2445 ml  Net 65.9 ml   Filed Weights   02/25/22 0400 02/28/22 0500 03/02/22 0500  Weight: 68.4 kg 72.8 kg 72.9 kg    Examination: Physical Exam Vitals reviewed.  Constitutional:      General: She is not in acute distress.    Appearance: She is ill-appearing.  HENT:     Mouth/Throat:     Mouth: Mucous membranes are moist.  Cardiovascular:     Rate and Rhythm: Normal rate and regular rhythm.  Pulmonary:     Comments: Ventilated breath sounds Abdominal:     General: There is no distension.     Palpations: Abdomen is soft.  Musculoskeletal:     Cervical back: Normal range of motion and neck supple.  Neurological:     Mental Status: She is disoriented.     Assessment & Plan:   61 year old female presenting with acute hypoxic respiratory failure secondary to pulmonary embolism complicated by pulmonary hemorrhage following  thrombectomy. She is now intubated and mechanically ventilated.   Neurology #Sedation  Switch sedation to dexmedetomidine, fentanyl for analgesia PRN   -daily sedation holiday   Pulmonary #Acute Hypoxic Respiratory Failure #Pulmonary Embolism #Pulmonary Hemorrhage #VAP  Presents with hypoxic respiratory failure and found to have pulmonary emboli for which she underwent thrombectomy by  vascular surgery. She developed severe hemoptysis secondary to pulmonary hemorrhage following her procedure requiring intubation and mechanical ventilation. She required multiple bronch's to clear airway secretions and clots, and course further complicated by HAP. [place] workup sent, pending ANA/ANCA/RF/CCP. No AKI to suggest anti-GBM syndrome. Will continue to wean off the ventilator and assess appropriateness for SBT and vent weaning. Will re-challenge her with heparin gtt without a bolus in the morning. Low suspicion for vasculitis, will d/c steroids. Will initiate trial of heparin gtt today.  -ABG today   Cardiovascular #Pulmonary Embolus   TTE with normal EF and no RV dysfunction or dilation. heparin gtt held given severe hemoptysis and hemorrhage, will re-challenge today. No indication for thrombolysis.   GI Tube feeds. Famotidine for prophylaxis   Renal   Kidney function at baseline, continue to monitor.   Hem/Onc   Holding heparin at the moment. Will rechallenge tomorrow.   ID #HAP   Rising white count and left sided infiltrate on CXR. On broad spectrum antibiotics pending respiratory cultures. Did develop a fever today and blood cultures re-sent.  Best Practice (right click and "Reselect all SmartList Selections" daily)   Diet/type: tubefeeds DVT prophylaxis: systemic heparin GI prophylaxis: PPI Lines: N/A Foley:  Yes, and it is still needed Code Status:  full code Last date of multidisciplinary goals of care discussion [03/03/2022]  Labs   CBC: Recent Labs  Lab 02/27/22 0421 02/28/22 0727 03/01/22 0501 03/02/22 0647 03/03/22 0406  WBC 17.5* 22.9* 19.8* 24.5* 32.3*  NEUTROABS 14.1*  --   --   --   --   HGB 8.3* 8.8* 8.6* 9.7* 10.7*  HCT 25.6* 28.0* 28.3* 32.4* 34.8*  MCV 89.2 92.1 94.6 95.0 94.8  PLT 145* 167 183 170 528    Basic Metabolic Panel: Recent Labs  Lab 02/26/22 0051 02/26/22 0307 02/27/22 0421 02/28/22 0727 03/01/22 0501 03/02/22 0647  03/03/22 0406  NA 140 139 144 145 147* 145 144  K 4.1 4.1 3.9 4.2 4.4 4.8 4.2  CL 111 109 111 107 104 100 99  CO2 23 22 26 31  38* 36* 38*  GLUCOSE 104* 123* 156* 145* 128* 131* 123*  BUN 14 14 14 16  31* 28* 35*  CREATININE 0.72 0.68 0.50 0.43* 0.42* 0.40* 0.47  CALCIUM 7.1* 7.7* 8.7* 9.1 9.5 9.3 9.1  MG 1.7 1.8 2.5* 2.2  --   --  2.2  PHOS 2.6 3.0 1.8* 2.3* 2.3* 3.8 3.5   GFR: Estimated Creatinine Clearance: 71.6 mL/min (by C-G formula based on SCr of 0.47 mg/dL). Recent Labs  Lab 02/24/22 1854 02/24/22 2056 02/25/22 0258 02/26/22 0307 02/27/22 0421 02/28/22 0726 02/28/22 0727 03/01/22 0501 03/02/22 0647 03/03/22 0406  PROCALCITON 0.23  --   --   --   --  1.61  --  1.15 0.68  --   WBC  --   --    < > 30.3*   < >  --  22.9* 19.8* 24.5* 32.3*  LATICACIDVEN 1.9 1.0  --  1.5  --   --   --   --   --   --    < > = values in this interval  not displayed.    Liver Function Tests: Recent Labs  Lab 02/26/22 0307 02/28/22 0727 03/01/22 0501 03/02/22 0647  AST 17  --   --   --   ALT 17  --   --   --   ALKPHOS 96  --   --   --   BILITOT 1.3*  --   --   --   PROT 5.8*  --   --   --   ALBUMIN 2.5* 2.9* 2.6* 2.6*   No results for input(s): "LIPASE", "AMYLASE" in the last 168 hours. No results for input(s): "AMMONIA" in the last 168 hours.  ABG    Component Value Date/Time   PHART 7.25 (L) 02/25/2022 2054   PCO2ART 57 (H) 02/25/2022 2054   PO2ART 89 02/25/2022 2054   HCO3 22.6 02/26/2022 0307   ACIDBASEDEF 4.0 (H) 02/26/2022 0307   O2SAT 59.3 02/26/2022 0307     Coagulation Profile: Recent Labs  Lab 02/24/22 1854  INR 1.3*    Cardiac Enzymes: No results for input(s): "CKTOTAL", "CKMB", "CKMBINDEX", "TROPONINI" in the last 168 hours.  HbA1C: No results found for: "HGBA1C"  CBG: Recent Labs  Lab 03/02/22 2000 03/02/22 2326 03/03/22 0332 03/03/22 0802 03/03/22 1143  GLUCAP 124* 126* 98 135* 161*    Review of Systems:   Unable to obtain  Past Medical  History:  She,  has a past medical history of GERD (gastroesophageal reflux disease) and Multiple sclerosis ([place]).   Surgical History:   Past Surgical History:  Procedure Laterality Date   ESOPHAGOGASTRODUODENOSCOPY (EGD) WITH PROPOFOL N/A 09/11/2021   Procedure: ESOPHAGOGASTRODUODENOSCOPY (EGD) WITH PROPOFOL;  Surgeon: Annamaria Helling, DO;  Location: [facility];  Service: Gastroenterology;  Laterality: N/A;   PULMONARY THROMBECTOMY Bilateral 02/25/2022   Procedure: PULMONARY THROMBECTOMY;  Surgeon: Algernon Huxley, MD;  Location: [place] [facility];  Service: Cardiovascular;  Laterality: Bilateral;   TUBAL LIGATION       Social History:   reports that she has never smoked. She has never used smokeless tobacco. She reports current alcohol use. She reports that she does not use drugs.   Family History:  Her family history is not on file.   Allergies Allergies  Allergen Reactions   Erythromycin Hives and Nausea And Vomiting   Lexapro [Escitalopram Oxalate] Hives     Home Medications  Prior to Admission medications   Medication Sig Start Date End Date Taking? Authorizing Provider  cyanocobalamin 1000 MCG tablet Take by mouth.   Yes [provider]  DULoxetine (CYMBALTA) 30 MG capsule Take 30 mg by mouth daily. 06/25/21  Yes [provider]  gabapentin (NEURONTIN) 400 MG capsule Take Gabapentin 400 mg in the morning, 400 mg in the afternoon, and 800 mg at night 12/29/21  Yes [provider]  omeprazole (PRILOSEC) 20 MG capsule Take 20 mg by mouth daily. 06/10/21  Yes [provider]  propranolol (INDERAL) 20 MG tablet Take 1 tablet by mouth 2 (two) times daily. 12/16/21  Yes [provider]  rOPINIRole (REQUIP) 0.25 MG tablet Take 0.25 mg at night for 1 week, then increase to 0.5 mg at night 05/29/21  Yes [provider]  tolterodine (DETROL LA) 2 MG 24 hr capsule Take 2 mg by mouth 2 (two) times daily. 12/29/21 12/29/22 Yes  [provider]  ascorbic acid (VITAMIN C) 500 MG tablet Take by mouth.    [provider]  Cholecalciferol 50 MCG (2000 UT) CAPS Take by mouth.  [provider]  cyclobenzaprine (FLEXERIL) 10 MG tablet Take 10 mg by mouth 2 (two) times daily as needed. 11/26/21   [provider]  dalfampridine 10 MG TB12 Take 1 tablet by mouth every 12 (twelve) hours. 12/29/21   [provider]  meloxicam (MOBIC) 15 MG tablet Take 1 tablet (15 mg total) by mouth daily. 08/23/21 08/23/22  Cuthriell, Delorise Royals, PA-C  methylPREDNISolone (MEDROL DOSEPAK) 4 MG TBPK tablet Take by mouth. 11/26/21   [provider]  naproxen (NAPROSYN) 500 MG tablet Take by mouth.    [provider]  nitrofurantoin, macrocrystal-monohydrate, (MACROBID) 100 MG capsule Take 1 capsule (100 mg total) by mouth 2 (two) times daily. 01/19/22   Immordino, Jeannett Senior, FNP  pregabalin (LYRICA) 75 MG capsule Take 75 mg by mouth 3 (three) times daily. 08/18/21   [provider]  traMADol (ULTRAM) 50 MG tablet Take 50 mg by mouth 3 (three) times daily as needed. 12/26/21   [provider]     Critical care time: 39 minutes

## 2022-03-03 NOTE — Progress Notes (Signed)
PHARMACY CONSULT NOTE  Pharmacy Consult for Electrolyte Monitoring and Replacement   Recent Labs: Potassium (mmol/L)  Date Value  03/03/2022 4.2   Magnesium (mg/dL)  Date Value  75/01/2584 2.2   Calcium (mg/dL)  Date Value  27/78/2423 9.1   Albumin (g/dL)  Date Value  53/61/4431 2.6 (L)   Phosphorus (mg/dL)  Date Value  54/00/8676 3.5   Sodium (mmol/L)  Date Value  03/03/2022 144   Assessment: 61 yo female presented to ED with SOB when lying flat and some right sided rib and shoulder pain.  Chest CT showed acute PE.  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 Pharmacy is asked to follow and replace electrolytes while in CCU.  Nutrition: Tube feeds @50ml /h, FWF 100 q4h,   Goal of Therapy:  Electrolytes within normal limits  Plan:  Renal fxn stable & lytes WNL, no repletion at this time. --Na 145>144, defer management to primary team --Phos 3.8>3.5:WNL now, no further repletion at this time. --Follow-up electrolytes with AM labs tomorrow  03/03/2022 9:29 AM

## 2022-03-03 NOTE — Consult Note (Signed)
ANTICOAGULATION CONSULT NOTE  Pharmacy Consult for Heparin Drip Indication: pulmonary embolus  Patient Measurements: Height: 5\' 2"  (157.5 cm) Weight: 72.9 kg (160 lb 11.5 oz) IBW/kg (Calculated) : 50.1 Heparin Dosing Weight: 57.2 kg  Labs: Recent Labs    02/28/22 2057 03/01/22 0501 03/01/22 0501 03/01/22 0816 03/02/22 0647 03/03/22 0406  HGB  --  8.6*   < >  --  9.7* 10.7*  HCT  --  28.3*  --   --  32.4* 34.8*  PLT  --  183  --   --  170 159  HEPARINUNFRC 0.26* 0.38  --  0.30  --   --   CREATININE  --  0.42*  --   --  0.40* 0.47   < > = values in this interval not displayed.    Estimated Creatinine Clearance: 71.6 mL/min (by C-G formula based on SCr of 0.47 mg/dL).  Medical History: Past Medical History:  Diagnosis Date   GERD (gastroesophageal reflux disease)    Multiple sclerosis (HCC)   Heparin Dosing Weight: 57.2 kg  Medications:  No home anticoagulation per pharmacist review.  Assessment: 61 yo female presented to ED with SOB when lying flat and some right sided rib and shoulder pain.  Chest CT showed acute PE.  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 Heparin was held in setting of hemoptysis on 11/19 .   Hgb 9.3-->6.6>(s/p 2u PRBC)>8.4>8.8>9.7, PLT wnl --s/p thrombectomy 11/15  Goal of Therapy:  Heparin level 0.3-0.5 units/ml (NO BOLUSES) Monitor platelets by anticoagulation protocol: Yes   Date Time aPTT/HL Rate/Comment 1117  2311  0.21  subtherapeutic; 1150 un/hr 1118 0727  0.19  subtherapeutic; 1250 un/hr 1118 1505  0.30  therapeutic x 1 1118 2057  0.26  subtherapeutic; 1350 un/hr 1119 0501  0.38  therapeutic x 1 1119 0816  0.30  therapeutic x 2 1450 un/hr   Plan:  Per CCM team, will re-challenge with [facility] for PE today. Heparin nomogram for 0.3-0.5 target & NO BOLUSES d/t very high bleed risk. Based on heparin levels prior to holding on 11/19, pt responded with Anti-Xa of 0.3 to infusion rate of 1450 un/hr. No bolus, then restart heparin infusion  rate at 1450 un/hr Recheck HL in 6hrs after heparin gtt restarted CMT Daily CBC per protocol while on IV heparin --Follow-up transition to oral anticoagulation  12/19  03/03/2022 10:48 AM

## 2022-03-03 NOTE — Progress Notes (Signed)
Nutrition Follow Up Note   DOCUMENTATION CODES:   Not applicable  INTERVENTION:   Continue Vital 1.5_0 /hr + ProSource TF 20- Give 6m daily via tube  Free water flushes 1068mq4 hours   Regimen provides 1880kcal/day, 101g/day protein and 151645may of free water.   Juven Fruit Punch BID via tube, each serving provides 95kcal and 2.5g of protein (amino acids glutamine and arginine)  Daily weights  NUTRITION DIAGNOSIS:   Inadequate oral intake related to inability to eat (pt sedated and ventilated) as evidenced by NPO status.  GOAL:   Provide needs based on ASPEN/SCCM guidelines -met   MONITOR:   Vent status, Labs, Weight trends, TF tolerance, Skin, I & O's  ASSESSMENT:   [age]o female with h/o MS, GERD and RLS who is admitted with bilateral PEs s/p thrombectomy 11/15. Pt also with new PNA.  Pt remains sedated and ventilated. OGT in place. Pt tolerating tube feeds at goal rate. Refeed labs stable. Per chart, pt is up ~10lbs since admission. Pt +8.1L on her I & Os.   Medications reviewed and include: B12, colace, pepcid, insulin, juven, protonix, miralax, heparin, zosyn   Labs reviewed: K 4.2 wnl, P 3.5 wnl, Mg 2.2 wnl Wbc- 32.3(H), Hgb 10.7(L), Hct 34.8(L) Cbgs- 161, 135, 98 x 24 hrs  Patient is currently intubated on ventilator support MV: 17.3 L/min Temp (24hrs), Avg:100.3 F (37.9 C), Min:99.1 F (37.3 C), Max:102.5 F (39.2 C)  Propofol: none   MAP- >35m63m  UOP- 2735ml37miet Order:   Diet Order             Diet NPO time specified  Diet effective now                  EDUCATION NEEDS:   No education needs have been identified at this time  Skin:  Skin Assessment: Reviewed RN Assessment (right foot- 1.5cm x 1.0cm x 0.1cm)  Last BM:  11/21- type 7  Height:   Ht Readings from Last 1 Encounters:  02/26/22 _1  (1.575 m)    Weight:   Wt Readings from Last 1 Encounters:  03/02/22 72.9 kg    Ideal Body Weight:  50 kg  BMI:  Body  mass index is 29.4 kg/m.  Estimated Nutritional Needs:   Kcal:  1700-2000kcal/day  Protein:  90-100g/day  Fluid:  1.5-1.8L/day  CaseyKoleen DistanceRD, LDN Please refer to [facility]ION[facility] HospitalRD and/or RD on-call/weekend/after hours pager

## 2022-03-04 DIAGNOSIS — I2699 Other pulmonary embolism without acute cor pulmonale: Secondary | ICD-10-CM | POA: Diagnosis not present

## 2022-03-04 DIAGNOSIS — G35 Multiple sclerosis: Secondary | ICD-10-CM | POA: Diagnosis not present

## 2022-03-04 DIAGNOSIS — J9601 Acute respiratory failure with hypoxia: Secondary | ICD-10-CM | POA: Diagnosis not present

## 2022-03-04 DIAGNOSIS — J189 Pneumonia, unspecified organism: Secondary | ICD-10-CM | POA: Diagnosis not present

## 2022-03-04 LAB — CBC
HCT: 28.2 % — ABNORMAL LOW (ref 36.0–46.0)
Hemoglobin: 8.6 g/dL — ABNORMAL LOW (ref 12.0–15.0)
MCH: 28.5 pg (ref 26.0–34.0)
MCHC: 30.5 g/dL (ref 30.0–36.0)
MCV: 93.4 fL (ref 80.0–100.0)
Platelets: 93 10*3/uL — ABNORMAL LOW (ref 150–400)
RBC: 3.02 MIL/uL — ABNORMAL LOW (ref 3.87–5.11)
RDW: 19.3 % — ABNORMAL HIGH (ref 11.5–15.5)
WBC: 31.1 10*3/uL — ABNORMAL HIGH (ref 4.0–10.5)
nRBC: 0.6 % — ABNORMAL HIGH (ref 0.0–0.2)

## 2022-03-04 LAB — BASIC METABOLIC PANEL
Anion gap: 6 (ref 5–15)
BUN: 36 mg/dL — ABNORMAL HIGH (ref 6–20)
CO2: 35 mmol/L — ABNORMAL HIGH (ref 22–32)
Calcium: 8.4 mg/dL — ABNORMAL LOW (ref 8.9–10.3)
Chloride: 98 mmol/L (ref 98–111)
Creatinine, Ser: 0.57 mg/dL (ref 0.44–1.00)
GFR, Estimated: >60 mL/min (ref >60)
Glucose, Bld: 109 mg/dL — ABNORMAL HIGH (ref 70–99)
Potassium: 4 mmol/L (ref 3.5–5.1)
Sodium: 139 mmol/L (ref 135–145)

## 2022-03-04 LAB — CBC WITH DIFFERENTIAL/PLATELET
Abs Immature Granulocytes: 5.61 10*3/uL — ABNORMAL HIGH (ref 0.00–0.07)
Basophils Absolute: 0.4 10*3/uL — ABNORMAL HIGH (ref 0.0–0.1)
Basophils Relative: 1 %
Eosinophils Absolute: 0.1 10*3/uL (ref 0.0–0.5)
Eosinophils Relative: 0 %
HCT: 31.7 % — ABNORMAL LOW (ref 36.0–46.0)
Hemoglobin: 9.7 g/dL — ABNORMAL LOW (ref 12.0–15.0)
Immature Granulocytes: 15 %
Lymphocytes Relative: 3 %
Lymphs Abs: 1 10*3/uL (ref 0.7–4.0)
MCH: 28.8 pg (ref 26.0–34.0)
MCHC: 30.6 g/dL (ref 30.0–36.0)
MCV: 94.1 fL (ref 80.0–100.0)
Monocytes Absolute: 6.8 10*3/uL — ABNORMAL HIGH (ref 0.1–1.0)
Monocytes Relative: 19 %
Neutro Abs: 22.6 10*3/uL — ABNORMAL HIGH (ref 1.7–7.7)
Neutrophils Relative %: 62 %
Platelets: 94 10*3/uL — ABNORMAL LOW (ref 150–400)
RBC: 3.37 MIL/uL — ABNORMAL LOW (ref 3.87–5.11)
RDW: 19.7 % — ABNORMAL HIGH (ref 11.5–15.5)
Smear Review: NORMAL
WBC: 36.5 10*3/uL — ABNORMAL HIGH (ref 4.0–10.5)
nRBC: 0.2 % (ref 0.0–0.2)

## 2022-03-04 LAB — BLOOD GAS, ARTERIAL
Acid-Base Excess: 13.9 mmol/L — ABNORMAL HIGH (ref 0.0–2.0)
Bicarbonate: 41.2 mmol/L — ABNORMAL HIGH (ref 20.0–28.0)
FIO2: 50 %
MECHVT: 440 mL
O2 Saturation: 96.9 %
PEEP: 5 cmH2O
Patient temperature: 37
RATE: 20 resp/min
pCO2 arterial: 62 mmHg — ABNORMAL HIGH (ref 32–48)
pH, Arterial: 7.43 (ref 7.35–7.45)
pO2, Arterial: 83 mmHg (ref 83–108)

## 2022-03-04 LAB — GLUCOSE, CAPILLARY
Glucose-Capillary: 109 mg/dL — ABNORMAL HIGH (ref 70–99)
Glucose-Capillary: 111 mg/dL — ABNORMAL HIGH (ref 70–99)
Glucose-Capillary: 118 mg/dL — ABNORMAL HIGH (ref 70–99)
Glucose-Capillary: 127 mg/dL — ABNORMAL HIGH (ref 70–99)
Glucose-Capillary: 135 mg/dL — ABNORMAL HIGH (ref 70–99)
Glucose-Capillary: 99 mg/dL (ref 70–99)

## 2022-03-04 LAB — PHOSPHORUS: Phosphorus: 4.6 mg/dL (ref 2.5–4.6)

## 2022-03-04 LAB — SARS CORONAVIRUS 2 BY RT PCR: SARS Coronavirus 2 by RT PCR: POSITIVE — AB

## 2022-03-04 LAB — CULTURE, RESPIRATORY W GRAM STAIN: Culture: NO GROWTH

## 2022-03-04 LAB — MAGNESIUM: Magnesium: 2.4 mg/dL (ref 1.7–2.4)

## 2022-03-04 LAB — HEPARIN LEVEL (UNFRACTIONATED)
Heparin Unfractionated: 0.81 IU/mL — ABNORMAL HIGH (ref 0.30–0.70)
Heparin Unfractionated: 0.51 IU/mL (ref 0.30–0.70)
Heparin Unfractionated: 0.19 IU/mL — ABNORMAL LOW (ref 0.30–0.70)

## 2022-03-04 LAB — PATHOLOGIST SMEAR REVIEW

## 2022-03-04 MED ORDER — HEPARIN (PORCINE) 25000 UT/250ML-% IV SOLN
1650.0000 [IU]/h | INTRAVENOUS | Status: DC
  Administered 2022-03-04 – 2022-03-05 (×3): 1200 [IU]/h via INTRAVENOUS
  Administered 2022-03-06: 1300 [IU]/h via INTRAVENOUS
  Administered 2022-03-07: 1400 [IU]/h via INTRAVENOUS
  Administered 2022-03-08: 1600 [IU]/h via INTRAVENOUS
  Administered 2022-03-09: 1650 [IU]/h via INTRAVENOUS
  Filled 2022-03-04 (×8): qty 250

## 2022-03-04 MED ORDER — NOREPINEPHRINE 4 MG/250ML-% IV SOLN
INTRAVENOUS | Status: AC
  Filled 2022-03-04: qty 250

## 2022-03-04 MED ORDER — NOREPINEPHRINE 4 MG/250ML-% IV SOLN
2.0000 ug/min | INTRAVENOUS | Status: DC
  Administered 2022-03-04: 4 ug/min via INTRAVENOUS

## 2022-03-04 MED ORDER — PANTOPRAZOLE SODIUM 40 MG IV SOLR
40.0000 mg | Freq: Two times a day (BID) | INTRAVENOUS | Status: DC
  Administered 2022-03-04 – 2022-03-06 (×4): 40 mg via INTRAVENOUS
  Filled 2022-03-04 (×4): qty 10

## 2022-03-04 NOTE — Consult Note (Signed)
ANTICOAGULATION CONSULT NOTE  Pharmacy Consult for Heparin Drip Indication: pulmonary embolus  Patient Measurements: Height: 5\' 2"  (157.5 cm) Weight: 72.9 kg (160 lb 11.5 oz) IBW/kg (Calculated) : 50.1 Heparin Dosing Weight: 57.2 kg  Labs: Recent Labs    03/02/22 0647 03/03/22 0406 03/03/22 1232 03/03/22 1240 03/04/22 0123 03/04/22 0833 03/04/22 1834  HGB 9.7* 10.7* 10.9*  --  8.6*  --  9.7*  HCT 32.4* 34.8* 35.6*  --  28.2*  --  31.7*  PLT 170 159 135*  --  93*  --  94*  HEPARINUNFRC  --   --   --    < > 0.81* 0.51 0.19*  CREATININE 0.40* 0.47  --   --  0.57  --   --    < > = values in this interval not displayed.    Estimated Creatinine Clearance: 71.6 mL/min (by C-G formula based on SCr of 0.57 mg/dL).  Medical History: Past Medical History:  Diagnosis Date   GERD (gastroesophageal reflux disease)    Multiple sclerosis (HCC)   Heparin Dosing Weight: 57.2 kg  Medications:  No home anticoagulation per pharmacist review.  Assessment: 61 yo female presented to ED with SOB when lying flat and some right sided rib and shoulder pain.  Chest CT showed acute PE.  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 Heparin was held in setting of hemoptysis on 11/19 .   Hgb 9.3-->6.6>(s/p 2u PRBC)>8.4>8.8>9.7, PLT wnl --s/p thrombectomy 11/15  Goal of Therapy:  Heparin level 0.3-0.5 units/ml (NO BOLUSES) Monitor platelets by anticoagulation protocol: Yes   Date Time aPTT/HL Rate/Comment 1117  2311  0.21  subtherapeutic; 1150 un/hr 1118 0727  0.19  subtherapeutic; 1250 un/hr 1118 1505  0.30  therapeutic x 1 1118 2057  0.26  subtherapeutic; 1350 un/hr 1119 0501  0.38  therapeutic x 1 1119 0816  0.30  therapeutic x 2 1450 un/hr 1121 1813 0.51  Supratherapeutic; 1450 un/hr 1122    0123    0.81                Supratherapeutic 1400>1200 un/hr 1122 0833 0.51  Supratherapeutic 1200>1150 un/hr 1122 1834 0.19  Subtherapeutic 1150>1200 un/hr   Plan:  11/22:  HL @ 1834 = 0.19,  subtherapeutic  Will increase heparin drip rate to 1200 units/hr.  Will recheck HL 6 hrs after rate change.  --Follow-up transition to oral anticoagulation  12/22  03/04/2022 9:05 PM

## 2022-03-04 NOTE — Progress Notes (Signed)
PHARMACY CONSULT NOTE  Pharmacy Consult for Electrolyte Monitoring and Replacement   Recent Labs: Potassium (mmol/L)  Date Value  03/04/2022 4.0   Magnesium (mg/dL)  Date Value  73/42/8768 2.4   Calcium (mg/dL)  Date Value  11/57/2620 8.4 (L)   Albumin (g/dL)  Date Value  35/59/7416 2.7 (L)   Phosphorus (mg/dL)  Date Value  38/45/3646 4.6   Sodium (mmol/L)  Date Value  03/04/2022 139   Assessment: 61 yo female presented to ED with SOB when lying flat and some right sided rib and shoulder pain.  Chest CT showed acute PE.  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 Pharmacy is asked to follow and replace electrolytes while in CCU.  Nutrition: Tube feeds @50ml /h, FWF 100 q4h, NS KVO  Goal of Therapy:  Electrolytes within normal limits  Plan:  UOP 1.2>1.31ml/k/h All lytes WNL at present not requiring repletion at this time. Follow-up electrolytes with AM labs tomorrow  11m 03/04/2022 9:39 AM

## 2022-03-04 NOTE — Progress Notes (Signed)
NAME:  [name], MRN:  [id number], DOB:  Aug 14, 1960, LOS: 8 ADMISSION DATE:  02/24/2022, CHIEF COMPLAINT:  hypoxic respiratory failure   History of Present Illness:  61 y.o. female admitted with Acute Hypoxic Respiratory Failure in setting of Bilateral Pulmonary Embolism.  Underwent thrombectomy with Vascular Surgery, post procedure developed Hemoptysis leading to severe blockage of airways requiring emergent intubation. S/p bronch with therapeutic aspiration of blood clots from tracheobronchial tree.  CTA PE: Was read as acute PE with moderate thrombus burden involving both lungs. Bilateral lower extremity venous doppler ultrasounds>>  occlusive thrombus in both peroneal veins.    Pertinent  Medical History  -GERD -MS  Significant Hospital Events: Including procedures, antibiotic start and stop dates in addition to other pertinent events   11/14: Admitted by Hospitalist.  Vascular Surgery consulted 11/15:  Thrombectomy performed, massive amounts of clot removed.  Post procedure developed Acute Hemoptysis requiring emergent intubation and transfer to ICU.  PCCM consulted, plan for emergent Bronchoscopy 11/15 3 hour BRONCH to extract clots 11/16  2 units PRBC's to be given.  Repeat Bronch. 11/17: Off pressors. Increasing FiO2 requirements (60% FiO2, 5 peep), CXR with increase in bilateral opacities concerning for PNA.  NO SBT today. Place OG and start feeds. 11/18: Heparin gtt restarted yesterday with no bolus, no hemoptysis reported and Hgb remains stable.  Slow improvement in FiO2 to 60% (from 75%) with diuresis yesterday, renal function remains normal so will diurese again today 11/19: Still with high vent requirements (60% FiO2, 10 PEEP), Heparin gtt d/c due to bmall volume bright red blood from ETT.  [facility] workup sent.  Holding diuresis due to increased Na+ & BUN.  Solumedrol increased to 40 mg BID 11/20: Slight improvement in vent requirements (50% FiO2, 8 PEEP).   Worsening Leukocytosis, tracheal aspirate results pending, start Zosyn. 11/21: repeat CT overnight, vomited while in CT.  Interim History / Subjective:  No acute events overnight.  Objective   Blood pressure (!) 113/59, pulse (!) 128, temperature 100.2 F (37.9 C), resp. rate 10, height 5\' 2"  (1.575 m), weight 72.9 kg, SpO2 95 %.    Vent Mode: PRVC FiO2 (%):  [40 %-50 %] 40 % Set Rate:  [16 bmp-20 bmp] 16 bmp Vt Set:  [440 mL] 440 mL PEEP:  [5 cmH20] 5 cmH20 Plateau Pressure:  [14 cmH20-21 cmH20] 15 cmH20   Intake/Output Summary (Last 24 hours) at 03/04/2022 1538 Last data filed at 03/04/2022 1336 Gross per 24 hour  Intake 1547.3 ml  Output 2200 ml  Net -652.7 ml    Filed Weights   02/25/22 0400 02/28/22 0500 03/02/22 0500  Weight: 68.4 kg 72.8 kg 72.9 kg    Examination: Physical Exam Vitals reviewed.  Constitutional:      General: She is not in acute distress.    Appearance: She is ill-appearing.  HENT:     Mouth/Throat:     Mouth: Mucous membranes are moist.  Cardiovascular:     Rate and Rhythm: Normal rate and regular rhythm.  Pulmonary:     Comments: Ventilated breath sounds Abdominal:     General: There is no distension.     Palpations: Abdomen is soft.  Musculoskeletal:     Cervical back: Normal range of motion and neck supple.  Neurological:     Mental Status: She is disoriented.     Assessment & Plan:   61 year old female presenting with acute hypoxic respiratory failure secondary to pulmonary embolism complicated by pulmonary hemorrhage following thrombectomy.  She is now intubated and mechanically ventilated.   Neurology #Sedation  Switch sedation to propofol and hydromorphone for analgosedation   -daily sedation holiday   Pulmonary #Acute Hypoxic Respiratory Failure #Pulmonary Embolism #Pulmonary Hemorrhage #VAP  Presents with hypoxic respiratory failure and found to have pulmonary emboli for which she underwent thrombectomy by vascular  surgery. She developed severe hemoptysis secondary to pulmonary hemorrhage following her procedure requiring intubation and mechanical ventilation. She required multiple bronch's to clear airway secretions and clots, and course further complicated by HAP. [place] workup sent, pending ANA/ANCA/RF/CCP overall negative. Pulmonary hemorrhage likely spontaneous in the setting of thrombectomy and anticoagulation. No AKI to suggest anti-GBM syndrome. Will continue to wean off the ventilator and assess appropriateness for SBT and vent weaning. Will re-challenge her with heparin gtt without a bolus which she has tolerated so far. Low suspicion for vasculitis, will d/c steroids.  -follow up respiratory cultures -SBT once able -repeat ABG today, titrate minute ventilation to pH  Cardiovascular #Pulmonary Embolus  TTE with normal EF and no RV dysfunction or dilation. So far tolerating heparin gtt re-challenge. No indication for thrombolysis.   GI Tube feeds, held due to ileus; will reinitiate at trickle. Famotidine for prophylaxis   Renal  Kidney function at baseline, continue to monitor.   Hem/Onc   Continue heparin. Monitor CBC bid for bleeding  ID #HAP   Rising white count and left sided infiltrate on CXR. On broad spectrum antibiotics pending respiratory cultures. Did develop a fever yesterday and blood cultures re-sent. COVID positive, unclear significance as reportedly had covid a few weeks ago  Best Practice (right click and "Reselect all SmartList Selections" daily)   Diet/type: tubefeeds DVT prophylaxis: systemic heparin GI prophylaxis: PPI Lines: N/A Foley:  Yes, and it is still needed Code Status:  full code Last date of multidisciplinary goals of care discussion [03/04/2022]  Labs   CBC: Recent Labs  Lab 02/27/22 0421 02/28/22 0727 03/01/22 0501 03/02/22 0647 03/03/22 0406 03/03/22 1232 03/04/22 0123  WBC 17.5*   < > 19.8* 24.5* 32.3* 32.8* 31.1*  NEUTROABS 14.1*  --   --    --   --  17.7*  --   HGB 8.3*   < > 8.6* 9.7* 10.7* 10.9* 8.6*  HCT 25.6*   < > 28.3* 32.4* 34.8* 35.6* 28.2*  MCV 89.2   < > 94.6 95.0 94.8 95.2 93.4  PLT 145*   < > 183 170 159 135* 93*   < > = values in this interval not displayed.     Basic Metabolic Panel: Recent Labs  Lab 02/26/22 0307 02/27/22 0421 02/28/22 0727 03/01/22 0501 03/02/22 0647 03/03/22 0406 03/04/22 0123  NA 139 144 145 147* 145 144 139  K 4.1 3.9 4.2 4.4 4.8 4.2 4.0  CL 109 111 107 104 100 99 98  CO2 22 26 31  38* 36* 38* 35*  GLUCOSE 123* 156* 145* 128* 131* 123* 109*  BUN 14 14 16  31* 28* 35* 36*  CREATININE 0.68 0.50 0.43* 0.42* 0.40* 0.47 0.57  CALCIUM 7.7* 8.7* 9.1 9.5 9.3 9.1 8.4*  MG 1.8 2.5* 2.2  --   --  2.2 2.4  PHOS 3.0 1.8* 2.3* 2.3* 3.8 3.5 4.6    GFR: Estimated Creatinine Clearance: 71.6 mL/min (by C-G formula based on SCr of 0.57 mg/dL). Recent Labs  Lab 02/26/22 0307 02/27/22 0421 02/28/22 0726 02/28/22 0727 03/01/22 0501 03/02/22 0647 03/03/22 0406 03/03/22 1232 03/03/22 1916 03/04/22 0123  PROCALCITON  --   --  1.61  --  1.15 0.68  --   --   --   --   WBC 30.3*   < >  --    < > 19.8* 24.5* 32.3* 32.8*  --  31.1*  LATICACIDVEN 1.5  --   --   --   --   --   --   --  1.4  --    < > = values in this interval not displayed.     Liver Function Tests: Recent Labs  Lab 02/26/22 0307 02/28/22 0727 03/01/22 0501 03/02/22 0647 03/03/22 1232  AST 17  --   --   --  36  ALT 17  --   --   --  44  ALKPHOS 96  --   --   --  119  BILITOT 1.3*  --   --   --  1.0  PROT 5.8*  --   --   --  5.4*  ALBUMIN 2.5* 2.9* 2.6* 2.6* 2.7*    No results for input(s): "LIPASE", "AMYLASE" in the last 168 hours. No results for input(s): "AMMONIA" in the last 168 hours.  ABG    Component Value Date/Time   PHART 7.43 03/04/2022 0500   PCO2ART 62 (H) 03/04/2022 0500   PO2ART 83 03/04/2022 0500   HCO3 41.2 (H) 03/04/2022 0500   ACIDBASEDEF 4.0 (H) 02/26/2022 0307   O2SAT 96.9 03/04/2022  0500     Coagulation Profile: No results for input(s): "INR", "PROTIME" in the last 168 hours.   Cardiac Enzymes: No results for input(s): "CKTOTAL", "CKMB", "CKMBINDEX", "TROPONINI" in the last 168 hours.  HbA1C: No results found for: "HGBA1C"  CBG: Recent Labs  Lab 03/03/22 1956 03/03/22 2325 03/04/22 0412 03/04/22 0823 03/04/22 1124  GLUCAP 109* 115* 111* 109* 118*     Review of Systems:   Unable to obtain  Past Medical History:  She,  has a past medical history of GERD (gastroesophageal reflux disease) and Multiple sclerosis ([place]).   Surgical History:   Past Surgical History:  Procedure Laterality Date   ESOPHAGOGASTRODUODENOSCOPY (EGD) WITH PROPOFOL N/A 09/11/2021   Procedure: ESOPHAGOGASTRODUODENOSCOPY (EGD) WITH PROPOFOL;  Surgeon: Annamaria Helling, DO;  Location: [facility] ENDOSCOPY;  Service: Gastroenterology;  Laterality: N/A;   PULMONARY THROMBECTOMY Bilateral 02/25/2022   Procedure: PULMONARY THROMBECTOMY;  Surgeon: Algernon Huxley, MD;  Location: [place] CV LAB;  Service: Cardiovascular;  Laterality: Bilateral;   TUBAL LIGATION       Social History:   reports that she has never smoked. She has never used smokeless tobacco. She reports current alcohol use. She reports that she does not use drugs.   Family History:  Her family history is not on file.   Allergies Allergies  Allergen Reactions   Erythromycin Hives and Nausea And Vomiting   Lexapro [Escitalopram Oxalate] Hives     Home Medications  Prior to Admission medications   Medication Sig Start Date End Date Taking? Authorizing Provider  cyanocobalamin 1000 MCG tablet Take by mouth.   Yes [provider]  DULoxetine (CYMBALTA) 30 MG capsule Take 30 mg by mouth daily. 06/25/21  Yes [provider]  gabapentin (NEURONTIN) 400 MG capsule Take Gabapentin 400 mg in the morning, 400 mg in the afternoon, and 800 mg at night 12/29/21  Yes [provider]  omeprazole  (PRILOSEC) 20 MG capsule Take 20 mg by mouth daily. 06/10/21  Yes [provider]  propranolol (INDERAL) 20 MG tablet Take 1 tablet by mouth 2 (  two) times daily. 12/16/21  Yes [provider]  rOPINIRole (REQUIP) 0.25 MG tablet Take 0.25 mg at night for 1 week, then increase to 0.5 mg at night 05/29/21  Yes [provider]  tolterodine (DETROL LA) 2 MG 24 hr capsule Take 2 mg by mouth 2 (two) times daily. 12/29/21 12/29/22 Yes [provider]  ascorbic acid (VITAMIN C) 500 MG tablet Take by mouth.    [provider]  Cholecalciferol 50 MCG (2000 UT) CAPS Take by mouth.    [provider]  cyclobenzaprine (FLEXERIL) 10 MG tablet Take 10 mg by mouth 2 (two) times daily as needed. 11/26/21   [provider]  dalfampridine 10 MG TB12 Take 1 tablet by mouth every 12 (twelve) hours. 12/29/21   [provider]  meloxicam (MOBIC) 15 MG tablet Take 1 tablet (15 mg total) by mouth daily. 08/23/21 08/23/22  Cuthriell, Charline Bills, PA-C  methylPREDNISolone (MEDROL DOSEPAK) 4 MG TBPK tablet Take by mouth. 11/26/21   [provider]  naproxen (NAPROSYN) 500 MG tablet Take by mouth.    [provider]  nitrofurantoin, macrocrystal-monohydrate, (MACROBID) 100 MG capsule Take 1 capsule (100 mg total) by mouth 2 (two) times daily. 01/19/22   Immordino, Annie Main, FNP  pregabalin (LYRICA) 75 MG capsule Take 75 mg by mouth 3 (three) times daily. 08/18/21   [provider]  traMADol (ULTRAM) 50 MG tablet Take 50 mg by mouth 3 (three) times daily as needed. 12/26/21   [provider]     Critical care time: 40 minutes

## 2022-03-04 NOTE — Consult Note (Signed)
ANTICOAGULATION CONSULT NOTE  Pharmacy Consult for Heparin Drip Indication: pulmonary embolus  Patient Measurements: Height: 5\' 2"  (157.5 cm) Weight: 72.9 kg (160 lb 11.5 oz) IBW/kg (Calculated) : 50.1 Heparin Dosing Weight: 57.2 kg  Labs: Recent Labs    03/02/22 0647 03/03/22 0406 03/03/22 1232 03/03/22 1240 03/03/22 1813 03/04/22 0123  HGB 9.7* 10.7* 10.9*  --   --  8.6*  HCT 32.4* 34.8* 35.6*  --   --  28.2*  PLT 170 159 135*  --   --  93*  HEPARINUNFRC  --   --   --  <0.10* 0.51 0.81*  CREATININE 0.40* 0.47  --   --   --  0.57    Estimated Creatinine Clearance: 71.6 mL/min (by C-G formula based on SCr of 0.57 mg/dL).  Medical History: Past Medical History:  Diagnosis Date   GERD (gastroesophageal reflux disease)    Multiple sclerosis (H[facility])   Heparin Dosing Weight: 57.2 kg  Medications:  No home anticoagulation per pharmacist review.  Assessment: 61 yo female presented to ED with SOB when lying flat and some right sided rib and shoulder pain.  Chest CT showed acute PE.  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 Heparin was held in setting of hemoptysis on 11/19 .   Hgb 9.3-->6.6>(s/p 2u PRBC)>8.4>8.8>9.7, PLT wnl --s/p thrombectomy 11/15  Goal of Therapy:  Heparin level 0.3-0.5 units/ml (NO BOLUSES) Monitor platelets by anticoagulation protocol: Yes   Date Time aPTT/HL Rate/Comment 1117  2311  0.21  subtherapeutic; 1150 un/hr 1118 0727  0.19  subtherapeutic; 1250 un/hr 1118 1505  0.30  therapeutic x 1 1118 2057  0.26  subtherapeutic; 1350 un/hr 1119 0501  0.38  therapeutic x 1 1119 0816  0.30  therapeutic x 2 1450 un/hr 1121 1813 0.51  Supratherapeutic; 1450 un/hr 1122    0123    0.81                Supratherapeutic 1400 un/hr  Plan:  11/22:  HL @ 0123 = 0.81, SUPRAtherapeutic  Will hold heparin drip for 30 min and restart at 1200 units/hr.  Will recheck HL 6 hrs after restart.  --Follow-up transition to oral anticoagulation  Maille Halliwell D   03/04/2022 2:02 AM

## 2022-03-04 NOTE — TOC Progression Note (Signed)
Transition of Care [facility]) - Progression Note    Patient Details  Name: [name] MRN: [id number] Date of Birth: 1960/09/16  [facility] CM/SW Contact  Allayne Butcher, RN Phone Number: 03/04/2022, 12:41 PM  Clinical Narrative:    Patient remains in the ICU intubated and sedated.  TOC continues to follow for needs.        Expected Discharge Plan and Services                                                 Social Determinants of Health (SDOH) Interventions    Readmission Risk Interventions     No data to display

## 2022-03-04 NOTE — Consult Note (Signed)
ANTICOAGULATION CONSULT NOTE  Pharmacy Consult for Heparin Drip Indication: pulmonary embolus  Patient Measurements: Height: 5\' 2"  (157.5 cm) Weight: 72.9 kg (160 lb 11.5 oz) IBW/kg (Calculated) : 50.1 Heparin Dosing Weight: 57.2 kg  Labs: Recent Labs    03/02/22 0647 03/03/22 0406 03/03/22 1232 03/03/22 1240 03/03/22 1813 03/04/22 0123 03/04/22 0833  HGB 9.7* 10.7* 10.9*  --   --  8.6*  --   HCT 32.4* 34.8* 35.6*  --   --  28.2*  --   PLT 170 159 135*  --   --  93*  --   HEPARINUNFRC  --   --   --    < > 0.51 0.81* 0.51  CREATININE 0.40* 0.47  --   --   --  0.57  --    < > = values in this interval not displayed.    Estimated Creatinine Clearance: 71.6 mL/min (by C-G formula based on SCr of 0.57 mg/dL).  Medical History: Past Medical History:  Diagnosis Date   GERD (gastroesophageal reflux disease)    Multiple sclerosis (HCC)   Heparin Dosing Weight: 57.2 kg  Medications:  No home anticoagulation per pharmacist review.  Assessment: 61 yo female presented to ED with SOB when lying flat and some right sided rib and shoulder pain.  Chest CT showed acute PE.  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 Heparin was held in setting of hemoptysis on 11/19 .   Hgb 9.3-->6.6>(s/p 2u PRBC)>8.4>8.8>9.7, PLT wnl --s/p thrombectomy 11/15  Goal of Therapy:  Heparin level 0.3-0.5 units/ml (NO BOLUSES) Monitor platelets by anticoagulation protocol: Yes   Date Time aPTT/HL Rate/Comment 1117  2311  0.21  subtherapeutic; 1150 un/hr 1118 0727  0.19  subtherapeutic; 1250 un/hr 1118 1505  0.30  therapeutic x 1 1118 2057  0.26  subtherapeutic; 1350 un/hr 1119 0501  0.38  therapeutic x 1 1119 0816  0.30  therapeutic x 2 1450 un/hr 1121 1813 0.51  Supratherapeutic; 1450 un/hr 1122    0123    0.81                Supratherapeutic 1400>1200 un/hr 1122 0833 0.51  Supratherapeutic 1200>1150 un/hr  Plan:  11/22:  HL @ 0833 = 0.51, slightly SUPRAtherapeutic  Will decrease heparin drip  rate to 1150 units/hr.  Will recheck HL 6 hrs after rate change.  --Follow-up transition to oral anticoagulation  12/22  03/04/2022 9:36 AM

## 2022-03-05 DIAGNOSIS — J189 Pneumonia, unspecified organism: Secondary | ICD-10-CM | POA: Diagnosis not present

## 2022-03-05 DIAGNOSIS — J9601 Acute respiratory failure with hypoxia: Secondary | ICD-10-CM | POA: Diagnosis not present

## 2022-03-05 DIAGNOSIS — I2699 Other pulmonary embolism without acute cor pulmonale: Secondary | ICD-10-CM | POA: Diagnosis not present

## 2022-03-05 DIAGNOSIS — A419 Sepsis, unspecified organism: Secondary | ICD-10-CM | POA: Diagnosis not present

## 2022-03-05 LAB — BASIC METABOLIC PANEL
Anion gap: 3 — ABNORMAL LOW (ref 5–15)
BUN: 34 mg/dL — ABNORMAL HIGH (ref 6–20)
CO2: 37 mmol/L — ABNORMAL HIGH (ref 22–32)
Calcium: 8.8 mg/dL — ABNORMAL LOW (ref 8.9–10.3)
Chloride: 100 mmol/L (ref 98–111)
Creatinine, Ser: 0.39 mg/dL — ABNORMAL LOW (ref 0.44–1.00)
GFR, Estimated: >60 mL/min (ref >60)
Glucose, Bld: 124 mg/dL — ABNORMAL HIGH (ref 70–99)
Potassium: 4.3 mmol/L (ref 3.5–5.1)
Sodium: 140 mmol/L (ref 135–145)

## 2022-03-05 LAB — CBC
HCT: 29.5 % — ABNORMAL LOW (ref 36.0–46.0)
Hemoglobin: 9 g/dL — ABNORMAL LOW (ref 12.0–15.0)
MCH: 29 pg (ref 26.0–34.0)
MCHC: 30.5 g/dL (ref 30.0–36.0)
MCV: 95.2 fL (ref 80.0–100.0)
Platelets: 90 10*3/uL — ABNORMAL LOW (ref 150–400)
RBC: 3.1 MIL/uL — ABNORMAL LOW (ref 3.87–5.11)
RDW: 19.7 % — ABNORMAL HIGH (ref 11.5–15.5)
WBC: 32.9 10*3/uL — ABNORMAL HIGH (ref 4.0–10.5)
nRBC: 0.1 % (ref 0.0–0.2)

## 2022-03-05 LAB — HEPARIN LEVEL (UNFRACTIONATED)
Heparin Unfractionated: 0.32 IU/mL (ref 0.30–0.70)
Heparin Unfractionated: 0.37 IU/mL (ref 0.30–0.70)

## 2022-03-05 LAB — GLUCOSE, CAPILLARY
Glucose-Capillary: 100 mg/dL — ABNORMAL HIGH (ref 70–99)
Glucose-Capillary: 114 mg/dL — ABNORMAL HIGH (ref 70–99)
Glucose-Capillary: 118 mg/dL — ABNORMAL HIGH (ref 70–99)
Glucose-Capillary: 133 mg/dL — ABNORMAL HIGH (ref 70–99)
Glucose-Capillary: 133 mg/dL — ABNORMAL HIGH (ref 70–99)
Glucose-Capillary: 81 mg/dL (ref 70–99)

## 2022-03-05 LAB — MAGNESIUM: Magnesium: 2.2 mg/dL (ref 1.7–2.4)

## 2022-03-05 LAB — PHOSPHORUS: Phosphorus: 2.7 mg/dL (ref 2.5–4.6)

## 2022-03-05 NOTE — Progress Notes (Signed)
NAME:  [name], MRN:  [id number], DOB:  04-22-1960, LOS: 9 ADMISSION DATE:  02/24/2022, CHIEF COMPLAINT:  hypoxic respiratory failure   History of Present Illness:  61 y.o. female admitted with Acute Hypoxic Respiratory Failure in setting of Bilateral Pulmonary Embolism.  Underwent thrombectomy with Vascular Surgery, post procedure developed Hemoptysis leading to severe blockage of airways requiring emergent intubation. S/p bronch with therapeutic aspiration of blood clots from tracheobronchial tree.  CTA PE: Was read as acute PE with moderate thrombus burden involving both lungs. Bilateral lower extremity venous doppler ultrasounds>>  occlusive thrombus in both peroneal veins.    Pertinent  Medical History  -GERD -MS  Significant Hospital Events: Including procedures, antibiotic start and stop dates in addition to other pertinent events   11/14: Admitted by Hospitalist.  Vascular Surgery consulted 11/15:  Thrombectomy performed, massive amounts of clot removed.  Post procedure developed Acute Hemoptysis requiring emergent intubation and transfer to ICU.  PCCM consulted, plan for emergent Bronchoscopy 11/15 3 hour BRONCH to extract clots 11/16  2 units PRBC's to be given.  Repeat Bronch. 11/17: Off pressors. Increasing FiO2 requirements (60% FiO2, 5 peep), CXR with increase in bilateral opacities concerning for PNA.  NO SBT today. Place OG and start feeds. 11/18: Heparin gtt restarted yesterday with no bolus, no hemoptysis reported and Hgb remains stable.  Slow improvement in FiO2 to 60% (from 75%) with diuresis yesterday, renal function remains normal so will diurese again today 11/19: Still with high vent requirements (60% FiO2, 10 PEEP), Heparin gtt d/c due to bmall volume bright red blood from ETT.  Collagen Vascular workup sent.  Holding diuresis due to increased Na+ & BUN.  Solumedrol increased to 40 mg BID 11/20: Slight improvement in vent requirements (50% FiO2, 8 PEEP).   Worsening Leukocytosis, tracheal aspirate results pending, start Zosyn. 11/21: repeat CT overnight, vomited while in CT. 11/22: no bleeding noted.  Interim History / Subjective:  No acute events overnight.  Objective   Blood pressure (!) 100/52, pulse (!) 115, temperature 99.5 F (37.5 C), temperature source Esophageal, resp. rate 13, height 5\' 2"  (1.575 m), weight 72.9 kg, SpO2 (!) 88 %.    Vent Mode: PRVC FiO2 (%):  [40 %-50 %] 40 % Set Rate:  [16 bmp-20 bmp] 16 bmp Vt Set:  [440 mL] 440 mL PEEP:  [5 cmH20] 5 cmH20 Plateau Pressure:  [15 cmH20-16 cmH20] 16 cmH20   Intake/Output Summary (Last 24 hours) at 03/05/2022 0814 Last data filed at 03/05/2022 0800 Gross per 24 hour  Intake 1835.78 ml  Output 1995 ml  Net -159.22 ml    Filed Weights   02/28/22 0500 03/02/22 0500 03/05/22 0500  Weight: 72.8 kg 72.9 kg 72.9 kg    Examination: Physical Exam Vitals reviewed.  Constitutional:      General: She is not in acute distress.    Appearance: She is ill-appearing.  HENT:     Mouth/Throat:     Mouth: Mucous membranes are moist.  Cardiovascular:     Rate and Rhythm: Normal rate and regular rhythm.  Pulmonary:     Comments: Ventilated breath sounds Abdominal:     General: There is no distension.     Palpations: Abdomen is soft.  Musculoskeletal:     Cervical back: Normal range of motion and neck supple.  Neurological:     Mental Status: She is disoriented.     Assessment & Plan:   61 year old female presenting with acute hypoxic respiratory failure secondary to  pulmonary embolism complicated by pulmonary hemorrhage following thrombectomy. She is now intubated and mechanically ventilated.   Neurology #Sedation  Switched sedation to propofol and hydromorphone for analgosedation   -daily sedation holiday, will titrate down as tolerated -RASS goal of -1   Pulmonary #Acute Hypoxic Respiratory Failure #Pulmonary Embolism #Pulmonary Hemorrhage #VAP  Presents  with hypoxic respiratory failure and found to have pulmonary emboli for which she underwent thrombectomy by vascular surgery. She developed severe hemoptysis secondary to pulmonary hemorrhage following her procedure requiring intubation and mechanical ventilation. She required multiple bronch's to clear airway secretions and clots, and course further complicated by HAP. DAH workup sent, ANA/ANCA/CCP negative, unclear significance of mildly positive RF. Pulmonary hemorrhage likely spontaneous in the setting of thrombectomy and anticoagulation. No AKI to suggest anti-GBM syndrome. Will continue to wean off the ventilator and assess appropriateness for SBT and vent weaning. continue re-challenge with heparin gtt which she has tolerated so far. Low suspicion for vasculitis, steroids discontinued.  -follow up respiratory cultures -SBT once able -repeat ABG today, titrate minute ventilation to pH  Cardiovascular #Pulmonary Embolus  TTE with normal EF and no RV dysfunction or dilation. So far tolerating heparin gtt re-challenge. No indication for thrombolysis.   GI Tube feeds, held due to ileus; will reinitiate at trickle and titrate up. Famotidine for prophylaxis   Renal  Kidney function at baseline, continue to monitor.   Hem/Onc   Continue heparin. Monitor CBC bid for bleeding  ID #HAP  Rising white count and left sided infiltrate on CXR. On broad spectrum antibiotics pending respiratory cultures. Did develop a fever and blood cultures re-sent. COVID positive, unclear significance as reportedly had covid a few weeks ago  -continue broad spectrum antibiotics -await culture results  Best Practice (right click and "Reselect all SmartList Selections" daily)   Diet/type: tubefeeds DVT prophylaxis: systemic heparin GI prophylaxis: PPI Lines: N/A Foley:  Yes, and it is still needed Code Status:  full code Last date of multidisciplinary goals of care discussion [03/05/2022]  Labs    CBC: Recent Labs  Lab 02/27/22 0421 02/28/22 0727 03/03/22 0406 03/03/22 1232 03/04/22 0123 03/04/22 1834 03/05/22 0224  WBC 17.5*   < > 32.3* 32.8* 31.1* 36.5* 32.9*  NEUTROABS 14.1*  --   --  17.7*  --  22.6*  --   HGB 8.3*   < > 10.7* 10.9* 8.6* 9.7* 9.0*  HCT 25.6*   < > 34.8* 35.6* 28.2* 31.7* 29.5*  MCV 89.2   < > 94.8 95.2 93.4 94.1 95.2  PLT 145*   < > 159 135* 93* 94* 90*   < > = values in this interval not displayed.     Basic Metabolic Panel: Recent Labs  Lab 02/27/22 0421 02/28/22 0727 03/01/22 0501 03/02/22 0647 03/03/22 0406 03/04/22 0123 03/05/22 0224  NA 144 145 147* 145 144 139 140  K 3.9 4.2 4.4 4.8 4.2 4.0 4.3  CL 111 107 104 100 99 98 100  CO2 26 31 38* 36* 38* 35* 37*  GLUCOSE 156* 145* 128* 131* 123* 109* 124*  BUN 14 16 31* 28* 35* 36* 34*  CREATININE 0.50 0.43* 0.42* 0.40* 0.47 0.57 0.39*  CALCIUM 8.7* 9.1 9.5 9.3 9.1 8.4* 8.8*  MG 2.5* 2.2  --   --  2.2 2.4 2.2  PHOS 1.8* 2.3* 2.3* 3.8 3.5 4.6 2.7    GFR: Estimated Creatinine Clearance: 71.6 mL/min (A) (by C-G formula based on SCr of 0.39 mg/dL (L)). Recent Labs  Lab 02/28/22  6767 02/28/22 0727 03/01/22 0501 03/02/22 0647 03/03/22 0406 03/03/22 1232 03/03/22 1916 03/04/22 0123 03/04/22 1834 03/05/22 0224  PROCALCITON 1.61  --  1.15 0.68  --   --   --   --   --   --   WBC  --    < > 19.8* 24.5*   < > 32.8*  --  31.1* 36.5* 32.9*  LATICACIDVEN  --   --   --   --   --   --  1.4  --   --   --    < > = values in this interval not displayed.     Liver Function Tests: Recent Labs  Lab 02/28/22 0727 03/01/22 0501 03/02/22 0647 03/03/22 1232  AST  --   --   --  36  ALT  --   --   --  44  ALKPHOS  --   --   --  119  BILITOT  --   --   --  1.0  PROT  --   --   --  5.4*  ALBUMIN 2.9* 2.6* 2.6* 2.7*    No results for input(s): "LIPASE", "AMYLASE" in the last 168 hours. No results for input(s): "AMMONIA" in the last 168 hours.  ABG    Component Value Date/Time   PHART  7.43 03/04/2022 0500   PCO2ART 62 (H) 03/04/2022 0500   PO2ART 83 03/04/2022 0500   HCO3 41.2 (H) 03/04/2022 0500   ACIDBASEDEF 4.0 (H) 02/26/2022 0307   O2SAT 96.9 03/04/2022 0500     Coagulation Profile: No results for input(s): "INR", "PROTIME" in the last 168 hours.   Cardiac Enzymes: No results for input(s): "CKTOTAL", "CKMB", "CKMBINDEX", "TROPONINI" in the last 168 hours.  HbA1C: No results found for: "HGBA1C"  CBG: Recent Labs  Lab 03/04/22 1549 03/04/22 1930 03/04/22 2349 03/05/22 0419 03/05/22 0733  GLUCAP 99 127* 135* 133* 81     Review of Systems:   Unable to obtain  Past Medical History:  She,  has a past medical history of GERD (gastroesophageal reflux disease) and Multiple sclerosis ([place]).   Surgical History:   Past Surgical History:  Procedure Laterality Date   ESOPHAGOGASTRODUODENOSCOPY (EGD) WITH PROPOFOL N/A 09/11/2021   Procedure: ESOPHAGOGASTRODUODENOSCOPY (EGD) WITH PROPOFOL;  Surgeon: Annamaria Helling, DO;  Location: [facility] ENDOSCOPY;  Service: Gastroenterology;  Laterality: N/A;   PULMONARY THROMBECTOMY Bilateral 02/25/2022   Procedure: PULMONARY THROMBECTOMY;  Surgeon: Algernon Huxley, MD;  Location: [place] [facility];  Service: Cardiovascular;  Laterality: Bilateral;   TUBAL LIGATION       Social History:   reports that she has never smoked. She has never used smokeless tobacco. She reports current alcohol use. She reports that she does not use drugs.   Family History:  Her family history is not on file.   Allergies Allergies  Allergen Reactions   Erythromycin Hives and Nausea And Vomiting   Lexapro [Escitalopram Oxalate] Hives     Home Medications  Prior to Admission medications   Medication Sig Start Date End Date Taking? Authorizing Provider  cyanocobalamin 1000 MCG tablet Take by mouth.   Yes [provider]  DULoxetine (CYMBALTA) 30 MG capsule Take 30 mg by mouth daily. 06/25/21  Yes [provider]   gabapentin (NEURONTIN) 400 MG capsule Take Gabapentin 400 mg in the morning, 400 mg in the afternoon, and 800 mg at night 12/29/21  Yes [provider]  omeprazole (PRILOSEC) 20 MG capsule Take 20 mg  by mouth daily. 06/10/21  Yes [provider]  propranolol (INDERAL) 20 MG tablet Take 1 tablet by mouth 2 (two) times daily. 12/16/21  Yes [provider]  rOPINIRole (REQUIP) 0.25 MG tablet Take 0.25 mg at night for 1 week, then increase to 0.5 mg at night 05/29/21  Yes [provider]  tolterodine (DETROL LA) 2 MG 24 hr capsule Take 2 mg by mouth 2 (two) times daily. 12/29/21 12/29/22 Yes [provider]  ascorbic acid (VITAMIN C) 500 MG tablet Take by mouth.    [provider]  Cholecalciferol 50 MCG (2000 UT) CAPS Take by mouth.    [provider]  cyclobenzaprine (FLEXERIL) 10 MG tablet Take 10 mg by mouth 2 (two) times daily as needed. 11/26/21   [provider]  dalfampridine 10 MG TB12 Take 1 tablet by mouth every 12 (twelve) hours. 12/29/21   [provider]  meloxicam (MOBIC) 15 MG tablet Take 1 tablet (15 mg total) by mouth daily. 08/23/21 08/23/22  Cuthriell, Charline Bills, PA-C  methylPREDNISolone (MEDROL DOSEPAK) 4 MG TBPK tablet Take by mouth. 11/26/21   [provider]  naproxen (NAPROSYN) 500 MG tablet Take by mouth.    [provider]  nitrofurantoin, macrocrystal-monohydrate, (MACROBID) 100 MG capsule Take 1 capsule (100 mg total) by mouth 2 (two) times daily. 01/19/22   Immordino, Annie Main, FNP  pregabalin (LYRICA) 75 MG capsule Take 75 mg by mouth 3 (three) times daily. 08/18/21   [provider]  traMADol (ULTRAM) 50 MG tablet Take 50 mg by mouth 3 (three) times daily as needed. 12/26/21   [provider]     Critical care time: 43 minutes

## 2022-03-05 NOTE — Progress Notes (Signed)
PHARMACY CONSULT NOTE  Pharmacy Consult for Electrolyte Monitoring and Replacement   Recent Labs: Potassium (mmol/L)  Date Value  03/05/2022 4.3   Magnesium (mg/dL)  Date Value  63/84/6659 2.2   Calcium (mg/dL)  Date Value  93/57/0177 8.8 (L)   Albumin (g/dL)  Date Value  93/90/3009 2.7 (L)   Phosphorus (mg/dL)  Date Value  23/30/0762 2.7   Sodium (mmol/L)  Date Value  03/05/2022 140   Assessment: 61 yo female presented to ED with SOB when lying flat and some right sided rib and shoulder pain.  Chest CT showed acute PE.  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 Pharmacy is asked to follow and replace electrolytes while in CCU.  Nutrition: Tube feeds @50ml /h, FWF 100 q4h, NS KVO  Goal of Therapy:  Electrolytes within normal limits  Plan:  All lytes WNL at present not requiring repletion at this time. Follow-up electrolytes with AM labs tomorrow  03/05/2022 9:01 AM

## 2022-03-05 NOTE — Consult Note (Signed)
ANTICOAGULATION CONSULT NOTE  Pharmacy Consult for Heparin Drip Indication: pulmonary embolus  Patient Measurements: Height: 5\' 2"  (157.5 cm) Weight: 72.9 kg (160 lb 11.5 oz) IBW/kg (Calculated) : 50.1 Heparin Dosing Weight: 57.2 kg  Labs: Recent Labs    03/03/22 0406 03/03/22 1232 03/04/22 0123 03/04/22 0833 03/04/22 1834 03/05/22 0224  HGB 10.7*   < > 8.6*  --  9.7* 9.0*  HCT 34.8*   < > 28.2*  --  31.7* 29.5*  PLT 159   < > 93*  --  94* 90*  HEPARINUNFRC  --    < > 0.81* 0.51 0.19* 0.32  CREATININE 0.47  --  0.57  --   --  0.39*   < > = values in this interval not displayed.    Estimated Creatinine Clearance: 71.6 mL/min (A) (by C-G formula based on SCr of 0.39 mg/dL (L)).  Medical History: Past Medical History:  Diagnosis Date   GERD (gastroesophageal reflux disease)    Multiple sclerosis ([facility])   Heparin Dosing Weight: 57.2 kg  Medications:  No home anticoagulation per pharmacist review.  Assessment: 61 yo female presented to ED with SOB when lying flat and some right sided rib and shoulder pain.  Chest CT showed acute PE.  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 Heparin was held in setting of hemoptysis on 11/19 .   Hgb 9.3-->6.6>(s/p 2u PRBC)>8.4>8.8>9.7, PLT wnl --s/p thrombectomy 11/15  Goal of Therapy:  Heparin level 0.3-0.5 units/ml (NO BOLUSES) Monitor platelets by anticoagulation protocol: Yes   Date Time aPTT/HL Rate/Comment 1117  2311  0.21  subtherapeutic; 1150 un/hr 1118 0727  0.19  subtherapeutic; 1250 un/hr 1118 1505  0.30  therapeutic x 1 1118 2057  0.26  subtherapeutic; 1350 un/hr 1119 0501  0.38  therapeutic x 1 1119 0816  0.30  therapeutic x 2 1450 un/hr 1121 1813 0.51  Supratherapeutic; 1450 un/hr 1122    0123    0.81                Supratherapeutic 1400>1200 un/hr 1122 0833 0.51  Supratherapeutic 1200>1150 un/hr 1122 1834 0.19  Subtherapeutic 1150>1200 un/hr  1123    0224    0.32                Therapeutic X 1   Plan:  11/23:   HL @ 0224 = 0.32, therapeutic X 1  Will continue pt on current rate and draw confirmation level on 11/23 @ 0800. --Follow-up transition to oral anticoagulation  Athel Merriweather D  03/05/2022 5:08 AM

## 2022-03-05 NOTE — Consult Note (Signed)
ANTICOAGULATION CONSULT NOTE  Pharmacy Consult for Heparin Drip Indication: pulmonary embolus  Patient Measurements: Height: 5\' 2"  (157.5 cm) Weight: 72.9 kg (160 lb 11.5 oz) IBW/kg (Calculated) : 50.1 Heparin Dosing Weight: 57.2 kg  Labs: Recent Labs    03/03/22 0406 03/03/22 1232 03/04/22 0123 03/04/22 0833 03/04/22 1834 03/05/22 0224  HGB 10.7*   < > 8.6*  --  9.7* 9.0*  HCT 34.8*   < > 28.2*  --  31.7* 29.5*  PLT 159   < > 93*  --  94* 90*  HEPARINUNFRC  --    < > 0.81* 0.51 0.19* 0.32  CREATININE 0.47  --  0.57  --   --  0.39*   < > = values in this interval not displayed.    Estimated Creatinine Clearance: 71.6 mL/min (A) (by C-G formula based on SCr of 0.39 mg/dL (L)).  Medical History: Past Medical History:  Diagnosis Date   GERD (gastroesophageal reflux disease)    Multiple sclerosis ([facility])   Heparin Dosing Weight: 57.2 kg  Medications:  No home anticoagulation per pharmacist review.  Assessment: 61 yo female presented to ED with SOB when lying flat and some right sided rib and shoulder pain.  Chest CT showed acute PE.  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 Heparin was held in setting of hemoptysis on 11/19 .   Hgb 9.3-->6.6>(s/p 2u PRBC)>8.4>8.8>9.7, PLT wnl --s/p thrombectomy 11/15  Goal of Therapy:  Heparin level 0.3-0.5 units/ml (NO BOLUSES) Monitor platelets by anticoagulation protocol: Yes   Date Time aPTT/HL Rate/Comment 1117  2311  0.21  subtherapeutic; 1150 un/hr 1118 0727  0.19  subtherapeutic; 1250 un/hr 1118 1505  0.30  therapeutic x 1 1118 2057  0.26  subtherapeutic; 1350 un/hr 1119 0501  0.38  therapeutic x 1 1119 0816  0.30  therapeutic x 2 1450 un/hr 1121 1813 0.51  Supratherapeutic; 1450 un/hr 1122    0123    0.81                Supratherapeutic 1400>1200 un/hr 1122 0833 0.51  Supratherapeutic 1200>1150 un/hr 1122 1834 0.19  Subtherapeutic 1150>1200 un/hr  1123    0224    0.32                Therapeutic X 1   Plan: heparin  level therapeutic X 2  --Will continue heparin infusion at 1200 units/hr --next heparin level in am 11/24 --continue daily CBC while on IV heparin --Follow-up transition to oral anticoagulation  12/24  03/05/2022 7:38 AM

## 2022-03-06 ENCOUNTER — Inpatient Hospital Stay: Payer: Medicare Other

## 2022-03-06 DIAGNOSIS — I2699 Other pulmonary embolism without acute cor pulmonale: Secondary | ICD-10-CM | POA: Diagnosis not present

## 2022-03-06 DIAGNOSIS — J9601 Acute respiratory failure with hypoxia: Secondary | ICD-10-CM | POA: Diagnosis not present

## 2022-03-06 DIAGNOSIS — R0489 Hemorrhage from other sites in respiratory passages: Secondary | ICD-10-CM | POA: Diagnosis not present

## 2022-03-06 DIAGNOSIS — G35 Multiple sclerosis: Secondary | ICD-10-CM | POA: Diagnosis not present

## 2022-03-06 LAB — GLUCOSE, CAPILLARY
Glucose-Capillary: 110 mg/dL — ABNORMAL HIGH (ref 70–99)
Glucose-Capillary: 124 mg/dL — ABNORMAL HIGH (ref 70–99)
Glucose-Capillary: 127 mg/dL — ABNORMAL HIGH (ref 70–99)
Glucose-Capillary: 137 mg/dL — ABNORMAL HIGH (ref 70–99)
Glucose-Capillary: 145 mg/dL — ABNORMAL HIGH (ref 70–99)
Glucose-Capillary: 152 mg/dL — ABNORMAL HIGH (ref 70–99)

## 2022-03-06 LAB — CULTURE, RESPIRATORY W GRAM STAIN
Culture: NO GROWTH
Gram Stain: NONE SEEN

## 2022-03-06 LAB — BASIC METABOLIC PANEL
Anion gap: 10 (ref 5–15)
BUN: 26 mg/dL — ABNORMAL HIGH (ref 6–20)
CO2: 32 mmol/L (ref 22–32)
Calcium: 8.6 mg/dL — ABNORMAL LOW (ref 8.9–10.3)
Chloride: 97 mmol/L — ABNORMAL LOW (ref 98–111)
Creatinine, Ser: 0.32 mg/dL — ABNORMAL LOW (ref 0.44–1.00)
GFR, Estimated: >60 mL/min (ref >60)
Glucose, Bld: 108 mg/dL — ABNORMAL HIGH (ref 70–99)
Potassium: 4.2 mmol/L (ref 3.5–5.1)
Sodium: 139 mmol/L (ref 135–145)

## 2022-03-06 LAB — CBC
HCT: 28.1 % — ABNORMAL LOW (ref 36.0–46.0)
Hemoglobin: 8.5 g/dL — ABNORMAL LOW (ref 12.0–15.0)
MCH: 28.7 pg (ref 26.0–34.0)
MCHC: 30.2 g/dL (ref 30.0–36.0)
MCV: 94.9 fL (ref 80.0–100.0)
Platelets: 110 10*3/uL — ABNORMAL LOW (ref 150–400)
RBC: 2.96 MIL/uL — ABNORMAL LOW (ref 3.87–5.11)
RDW: 19.3 % — ABNORMAL HIGH (ref 11.5–15.5)
WBC: 27.8 10*3/uL — ABNORMAL HIGH (ref 4.0–10.5)
nRBC: 0.1 % (ref 0.0–0.2)

## 2022-03-06 LAB — HEPARIN LEVEL (UNFRACTIONATED)
Heparin Unfractionated: 0.17 IU/mL — ABNORMAL LOW (ref 0.30–0.70)
Heparin Unfractionated: 0.24 IU/mL — ABNORMAL LOW (ref 0.30–0.70)
Heparin Unfractionated: 0.3 IU/mL (ref 0.30–0.70)

## 2022-03-06 LAB — TRIGLYCERIDES: Triglycerides: 212 mg/dL — ABNORMAL HIGH (ref <150)

## 2022-03-06 MED ORDER — HEPARIN BOLUS VIA INFUSION
1700.0000 [IU] | Freq: Once | INTRAVENOUS | Status: DC
  Filled 2022-03-06: qty 1700

## 2022-03-06 MED ORDER — FUROSEMIDE 10 MG/ML IJ SOLN
20.0000 mg | Freq: Once | INTRAMUSCULAR | Status: AC
  Administered 2022-03-06: 20 mg via INTRAVENOUS
  Filled 2022-03-06: qty 2

## 2022-03-06 NOTE — Progress Notes (Signed)
NAME:  [name], MRN:  [id number], DOB:  Jun 17, 1960, LOS: 10 ADMISSION DATE:  02/24/2022, CHIEF COMPLAINT:  hypoxic respiratory failure   History of Present Illness:  61 y.o. female admitted with Acute Hypoxic Respiratory Failure in setting of Bilateral Pulmonary Embolism.  Underwent thrombectomy with Vascular Surgery, post procedure developed Hemoptysis leading to severe blockage of airways requiring emergent intubation. S/p bronch with therapeutic aspiration of blood clots from tracheobronchial tree.  CTA PE: Was read as acute PE with moderate thrombus burden involving both lungs. Bilateral lower extremity venous doppler ultrasounds>>  occlusive thrombus in both peroneal veins.   Pertinent  Medical History  -GERD -MS  Significant Hospital Events: Including procedures, antibiotic start and stop dates in addition to other pertinent events   11/14: Admitted by Hospitalist.  Vascular Surgery consulted 11/15:  Thrombectomy performed, massive amounts of clot removed.  Post procedure developed Acute Hemoptysis requiring emergent intubation and transfer to ICU.  PCCM consulted, plan for emergent Bronchoscopy 11/15 3 hour BRONCH to extract clots 11/16  2 units PRBC's to be given.  Repeat Bronch. 11/17: Off pressors. Increasing FiO2 requirements (60% FiO2, 5 peep), CXR with increase in bilateral opacities concerning for PNA.  NO SBT today. Place OG and start feeds. 11/18: Heparin gtt restarted yesterday with no bolus, no hemoptysis reported and Hgb remains stable.  Slow improvement in FiO2 to 60% (from 75%) with diuresis yesterday, renal function remains normal so will diurese again today 11/19: Still with high vent requirements (60% FiO2, 10 PEEP), Heparin gtt d/c due to bmall volume bright red blood from ETT.  [facility] workup sent.  Holding diuresis due to increased Na+ & BUN.  Solumedrol increased to 40 mg BID 11/20: Slight improvement in vent requirements (50% FiO2, 8 PEEP).   Worsening Leukocytosis, tracheal aspirate results pending, start Zosyn. 11/21: repeat CT overnight, vomited while in CT. 11/22: no bleeding noted.  Interim History / Subjective:  No acute events overnight.  Objective   Blood pressure (!) 124/56, pulse (!) 135, temperature 99.5 F (37.5 C), resp. rate 14, height 5\' 2"  (1.575 m), weight 73.1 kg, SpO2 99 %.    Vent Mode: Spontaneous FiO2 (%):  [35 %-40 %] 35 % Set Rate:  [16 bmp] 16 bmp Vt Set:  [440 mL] 440 mL PEEP:  [5 cmH20] 5 cmH20 Pressure Support:  [8 cmH20] 8 cmH20 Plateau Pressure:  [10 cmH20] 10 cmH20   Intake/Output Summary (Last 24 hours) at 03/06/2022 0832 Last data filed at 03/06/2022 0600 Gross per 24 hour  Intake 1914.71 ml  Output 2175 ml  Net -260.29 ml    Filed Weights   03/02/22 0500 03/05/22 0500 03/06/22 0500  Weight: 72.9 kg 72.9 kg 73.1 kg    Examination: Physical Exam Vitals reviewed.  Constitutional:      General: She is not in acute distress.    Appearance: She is ill-appearing.  HENT:     Mouth/Throat:     Mouth: Mucous membranes are moist.  Cardiovascular:     Rate and Rhythm: Normal rate and regular rhythm.  Pulmonary:     Comments: Ventilated breath sounds Abdominal:     General: There is no distension.     Palpations: Abdomen is soft.  Musculoskeletal:     Cervical back: Normal range of motion and neck supple.  Neurological:     Mental Status: She is disoriented.     Assessment & Plan:   61 year old female presenting with acute hypoxic respiratory failure secondary to  pulmonary embolism complicated by pulmonary hemorrhage following thrombectomy. She is now intubated and mechanically ventilated.   Neurology #Sedation  Switched sedation to propofol and hydromorphone for analgosedation and she has tolerated this well.   -daily sedation holiday, will titrate down as tolerated -RASS goal of 0 to -1   Pulmonary #Acute Hypoxic Respiratory Failure #Pulmonary Embolism #Pulmonary  Hemorrhage #VAP  Presents with hypoxic respiratory failure and found to have pulmonary emboli for which she underwent thrombectomy by vascular surgery. She developed severe hemoptysis secondary to pulmonary hemorrhage following her procedure requiring intubation and mechanical ventilation. She required multiple bronchs to clear airway secretions and clots, and course further complicated by HAP. [place] workup sent, ANA/ANCA/CCP negative, unclear significance of mildly positive RF. Pulmonary hemorrhage likely spontaneous in the setting of thrombectomy and anticoagulation. No AKI to suggest anti-GBM syndrome. She has tolerated a re-challenge with heparin well over the past 48 hours. Will continue to wean off the ventilator and assess appropriateness for SBT and vent weaning. Low suspicion for vasculitis, steroids discontinued.  -follow up respiratory cultures -SBT once able -repeat ABG today, titrate minute ventilation to pH -CXR today  Cardiovascular #Pulmonary Embolus  TTE with normal EF and no RV dysfunction or dilation. So far tolerating heparin gtt re-challenge. No indication for thrombolysis.   GI Tube feeds, held due to ileus; reinitiated at trickle and titrated up. Famotidine for prophylaxis   Renal  Kidney function at baseline, continue to monitor. Will administer 20 mg of furosemide today for diuresis.  Hem/Onc  Continue heparin. Monitor CBC bid for bleeding  ID #HAP  Rising white count and left sided infiltrate on CXR. On broad spectrum antibiotics pending respiratory cultures. Did develop a fever and blood cultures re-sent. COVID positive, unclear significance as reportedly had covid a few weeks ago  -continue broad spectrum antibiotics -await culture results  Best Practice (right click and "Reselect all SmartList Selections" daily)   Diet/type: tubefeeds DVT prophylaxis: systemic heparin GI prophylaxis: PPI Lines: N/A Foley:  Yes, and it is still needed Code Status:   full code Last date of multidisciplinary goals of care discussion [03/06/2022]  Labs   CBC: Recent Labs  Lab 03/03/22 1232 03/04/22 0123 03/04/22 1834 03/05/22 0224 03/06/22 0513  WBC 32.8* 31.1* 36.5* 32.9* 27.8*  NEUTROABS 17.7*  --  22.6*  --   --   HGB 10.9* 8.6* 9.7* 9.0* 8.5*  HCT 35.6* 28.2* 31.7* 29.5* 28.1*  MCV 95.2 93.4 94.1 95.2 94.9  PLT 135* 93* 94* 90* 110*     Basic Metabolic Panel: Recent Labs  Lab 02/28/22 0727 03/01/22 0501 03/02/22 0647 03/03/22 0406 03/04/22 0123 03/05/22 0224 03/06/22 0513  NA 145 147* 145 144 139 140 139  K 4.2 4.4 4.8 4.2 4.0 4.3 4.2  CL 107 104 100 99 98 100 97*  CO2 31 38* 36* 38* 35* 37* 32  GLUCOSE 145* 128* 131* 123* 109* 124* 108*  BUN 16 31* 28* 35* 36* 34* 26*  CREATININE 0.43* 0.42* 0.40* 0.47 0.57 0.39* 0.32*  CALCIUM 9.1 9.5 9.3 9.1 8.4* 8.8* 8.6*  MG 2.2  --   --  2.2 2.4 2.2  --   PHOS 2.3* 2.3* 3.8 3.5 4.6 2.7  --     GFR: Estimated Creatinine Clearance: 71.8 mL/min (A) (by C-G formula based on SCr of 0.32 mg/dL (L)). Recent Labs  Lab 02/28/22 0726 02/28/22 0727 03/01/22 0501 03/02/22 9509 03/03/22 0406 03/03/22 1916 03/04/22 0123 03/04/22 1834 03/05/22 0224 03/06/22 0513  PROCALCITON 1.61  --  1.15 0.68  --   --   --   --   --   --   WBC  --    < > 19.8* 24.5*   < >  --  31.1* 36.5* 32.9* 27.8*  LATICACIDVEN  --   --   --   --   --  1.4  --   --   --   --    < > = values in this interval not displayed.     Liver Function Tests: Recent Labs  Lab 02/28/22 0727 03/01/22 0501 03/02/22 0647 03/03/22 1232  AST  --   --   --  36  ALT  --   --   --  44  ALKPHOS  --   --   --  119  BILITOT  --   --   --  1.0  PROT  --   --   --  5.4*  ALBUMIN 2.9* 2.6* 2.6* 2.7*    No results for input(s): "LIPASE", "AMYLASE" in the last 168 hours. No results for input(s): "AMMONIA" in the last 168 hours.  ABG    Component Value Date/Time   PHART 7.43 03/04/2022 0500   PCO2ART 62 (H) 03/04/2022 0500    PO2ART 83 03/04/2022 0500   HCO3 41.2 (H) 03/04/2022 0500   ACIDBASEDEF 4.0 (H) 02/26/2022 0307   O2SAT 96.9 03/04/2022 0500     Coagulation Profile: No results for input(s): "INR", "PROTIME" in the last 168 hours.   Cardiac Enzymes: No results for input(s): "CKTOTAL", "CKMB", "CKMBINDEX", "TROPONINI" in the last 168 hours.  HbA1C: No results found for: "HGBA1C"  CBG: Recent Labs  Lab 03/05/22 1138 03/05/22 1550 03/05/22 2004 03/05/22 2351 03/06/22 0358  GLUCAP 118* 133* 114* 100* 110*     Review of Systems:   Unable to obtain  Past Medical History:  She,  has a past medical history of GERD (gastroesophageal reflux disease) and Multiple sclerosis ([place]).   Surgical History:   Past Surgical History:  Procedure Laterality Date   ESOPHAGOGASTRODUODENOSCOPY (EGD) WITH PROPOFOL N/A 09/11/2021   Procedure: ESOPHAGOGASTRODUODENOSCOPY (EGD) WITH PROPOFOL;  Surgeon: Annamaria Helling, DO;  Location: [facility] ENDOSCOPY;  Service: Gastroenterology;  Laterality: N/A;   PULMONARY THROMBECTOMY Bilateral 02/25/2022   Procedure: PULMONARY THROMBECTOMY;  Surgeon: Algernon Huxley, MD;  Location: [place] [facility];  Service: Cardiovascular;  Laterality: Bilateral;   TUBAL LIGATION       Social History:   reports that she has never smoked. She has never used smokeless tobacco. She reports current alcohol use. She reports that she does not use drugs.   Family History:  Her family history is not on file.   Allergies Allergies  Allergen Reactions   Erythromycin Hives and Nausea And Vomiting   Lexapro [Escitalopram Oxalate] Hives     Home Medications  Prior to Admission medications   Medication Sig Start Date End Date Taking? Authorizing Provider  cyanocobalamin 1000 MCG tablet Take by mouth.   Yes [provider]  DULoxetine (CYMBALTA) 30 MG capsule Take 30 mg by mouth daily. 06/25/21  Yes [provider]  gabapentin (NEURONTIN) 400 MG capsule Take Gabapentin 400  mg in the morning, 400 mg in the afternoon, and 800 mg at night 12/29/21  Yes [provider]  omeprazole (PRILOSEC) 20 MG capsule Take 20 mg by mouth daily. 06/10/21  Yes [provider]  propranolol (INDERAL) 20 MG tablet Take 1 tablet by mouth 2 (two) times daily. 12/16/21  Yes [provider]  rOPINIRole (REQUIP) 0.25 MG tablet Take 0.25 mg at night for 1 week, then increase to 0.5 mg at night 05/29/21  Yes [provider]  tolterodine (DETROL LA) 2 MG 24 hr capsule Take 2 mg by mouth 2 (two) times daily. 12/29/21 12/29/22 Yes [provider]  ascorbic acid (VITAMIN C) 500 MG tablet Take by mouth.    [provider]  Cholecalciferol 50 MCG (2000 UT) CAPS Take by mouth.    [provider]  cyclobenzaprine (FLEXERIL) 10 MG tablet Take 10 mg by mouth 2 (two) times daily as needed. 11/26/21   [provider]  dalfampridine 10 MG TB12 Take 1 tablet by mouth every 12 (twelve) hours. 12/29/21   [provider]  meloxicam (MOBIC) 15 MG tablet Take 1 tablet (15 mg total) by mouth daily. 08/23/21 08/23/22  Cuthriell, Charline Bills, PA-C  methylPREDNISolone (MEDROL DOSEPAK) 4 MG TBPK tablet Take by mouth. 11/26/21   [provider]  naproxen (NAPROSYN) 500 MG tablet Take by mouth.    [provider]  nitrofurantoin, macrocrystal-monohydrate, (MACROBID) 100 MG capsule Take 1 capsule (100 mg total) by mouth 2 (two) times daily. 01/19/22   Immordino, Annie Main, FNP  pregabalin (LYRICA) 75 MG capsule Take 75 mg by mouth 3 (three) times daily. 08/18/21   [provider]  traMADol (ULTRAM) 50 MG tablet Take 50 mg by mouth 3 (three) times daily as needed. 12/26/21   [provider]     Critical care time: 43 minutes

## 2022-03-06 NOTE — TOC Progression Note (Signed)
Transition of Care [facility]) - Progression Note    Patient Details  Name: [name] MRN: [id number] Date of Birth: Jul 16, 1960  Transition of Care [facility]) CM/SW Contact  Allayne Butcher, RN Phone Number: 03/06/2022, 3:40 PM  Clinical Narrative:    Patient remains intubated.  If unable to wean vent, plan will be for a trach.  Patient will be a candidate for LTACH.         Expected Discharge Plan and Services                                                 Social Determinants of Health (SDOH) Interventions    Readmission Risk Interventions     No data to display

## 2022-03-06 NOTE — Progress Notes (Signed)
Dr. Aundria Rud, made aware of patients on going fever, Tylenol given at 1201, PRN every 6 hours. No orders given. Will cont to assess.

## 2022-03-06 NOTE — Progress Notes (Signed)
Nutrition Follow-up  DOCUMENTATION CODES:   Not applicable  INTERVENTION:   Continue TF via OGT:    Vital 1.5@ 20 ml/hr  and advance 10 ml/hr every 8 hours to goal of 50 ml/hr  ProSource TF 20- Give 46ml daily via tube   Free water flushes q4 hours    Regimen provides 1880kcal/day, 101g/day protein and 1522ml/day of free water.    Juven Fruit Punch BID via tube, each serving provides 95kcal and 2.5g of protein (amino acids glutamine and arginine)   Daily weights  NUTRITION DIAGNOSIS:   Inadequate oral intake related to inability to eat (pt sedated and ventilated) as evidenced by NPO status.  Ongoing  GOAL:   Provide needs based on ASPEN/SCCM guidelines  Progressing   MONITOR:   Vent status, Labs, Weight trends, TF tolerance, Skin, I & O's  REASON FOR ASSESSMENT:   Ventilator    ASSESSMENT:   61 y/o female with h/o MS, GERD and RLS who is admitted with bilateral PEs s/p thrombectomy 11/15. Pt also with new PNA.  Patient is currently intubated on ventilator support MV: 13 L/min Temp (24hrs), Avg:100.1 F (37.8 C), Min:99.3 F (37.4 C), Max:101.5 F (38.6 C)  Propofol: 8.75 ml/hr (provides 231 kcals)  Reviewed I/O's: -272 ml x 24 hours and +6.4 L since admission  UOP: 1.9 L x 24 hours  Case discussed with RN, MD, and during ICU rounds. Plan for sedation holiday and SBT today. Pt was transitioned to trickle feeds on 03/04/22 secondary to suspected aspiration event. Plan to slowly increase feedings to goal rate today.   Medications reviewed and include vitamin B-12, colace, and dilaudid.  Labs reviewed: CBGS: 127-137 (inpatient orders for glycemic control are 0-15 units insulin aspart every 4 hours).    Diet Order:   Diet Order             Diet NPO time specified  Diet effective now                   EDUCATION NEEDS:   No education needs have been identified at this time  Skin:  Skin Assessment: Reviewed RN Assessment (right foot-  1.5cm x 1.0cm x 0.1cm)  Last BM:  03/06/22 (250 ml via rectal tube)  Height:   Ht Readings from Last 1 Encounters:  02/26/22 5\' 2"  (1.575 m)    Weight:   Wt Readings from Last 1 Encounters:  03/06/22 73.1 kg    Ideal Body Weight:  50 kg  BMI:  Body mass index is 29.48 kg/m.  Estimated Nutritional Needs:   Kcal:  1700-2000kcal/day  Protein:  90-100g/day  Fluid:  1.5-1.8L/day    03/08/22, RD, LDN, CDCES Registered Dietitian II Certified Diabetes Care and Education Specialist Please refer to AMION for RD and/or RD on-call/weekend/after hours pager

## 2022-03-06 NOTE — Consult Note (Signed)
ANTICOAGULATION CONSULT NOTE  Pharmacy Consult for Heparin Drip Indication: pulmonary embolus  Patient Measurements: Height: 5\' 2"  (157.5 cm) Weight: 73.1 kg (161 lb 2.5 oz) IBW/kg (Calculated) : 50.1 Heparin Dosing Weight: 57.2 kg  Labs: Recent Labs    03/04/22 0123 03/04/22 0833 03/04/22 1834 03/05/22 0224 03/05/22 0823 03/06/22 0513  HGB 8.6*  --  9.7* 9.0*  --  8.5*  HCT 28.2*  --  31.7* 29.5*  --  28.1*  PLT 93*  --  94* 90*  --  110*  HEPARINUNFRC 0.81*   < > 0.19* 0.32 0.37 0.17*  CREATININE 0.57  --   --  0.39*  --   --    < > = values in this interval not displayed.    Estimated Creatinine Clearance: 71.8 mL/min (A) (by C-G formula based on SCr of 0.39 mg/dL (L)).  Medical History: Past Medical History:  Diagnosis Date   GERD (gastroesophageal reflux disease)    Multiple sclerosis (HCC)   Heparin Dosing Weight: 57.2 kg  Medications:  No home anticoagulation per pharmacist review.  Assessment: 61 yo female presented to ED with SOB when lying flat and some right sided rib and shoulder pain.  Chest CT showed acute PE.  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 Heparin was held in setting of hemoptysis on 11/19 .   Hgb 9.3-->6.6>(s/p 2u PRBC)>8.4>8.8>9.7, PLT wnl --s/p thrombectomy 11/15  Goal of Therapy:  Heparin level 0.3-0.5 units/ml (NO BOLUSES) Monitor platelets by anticoagulation protocol: Yes   Date Time aPTT/HL Rate/Comment 1117  2311  0.21  subtherapeutic; 1150 un/hr 1118 0727  0.19  subtherapeutic; 1250 un/hr 1118 1505  0.30  therapeutic x 1 1118 2057  0.26  subtherapeutic; 1350 un/hr 1119 0501  0.38  therapeutic x 1 1119 0816  0.30  therapeutic x 2 1450 un/hr 1121 1813 0.51  Supratherapeutic; 1450 un/hr 1122    0123    0.81                Supratherapeutic 1400>1200 un/hr 1122 0833 0.51  Supratherapeutic 1200>1150 un/hr 1122 1834 0.19  Subtherapeutic 1150>1200 un/hr  1123    0224    0.32                Therapeutic X 1  1124    0513    0.17                 SUBtherapeutic   Plan:  11/24:  HL @ 0513 = 0.17, SUBtherapeutic  Will increase drip rate to 1300 units/hr and recheck HL 6 hrs after rate change.  --continue daily CBC while on IV heparin --Follow-up transition to oral anticoagulation  Carnetta Losada D  03/06/2022 6:16 AM

## 2022-03-06 NOTE — Consult Note (Signed)
ANTICOAGULATION CONSULT NOTE  Pharmacy Consult for Heparin Drip Indication: pulmonary embolus  Patient Measurements: Height: 5\' 2"  (157.5 cm) Weight: 73.1 kg (161 lb 2.5 oz) IBW/kg (Calculated) : 50.1 Heparin Dosing Weight: 57.2 kg  Labs: Recent Labs    03/04/22 0123 03/04/22 0833 03/04/22 1834 03/05/22 0224 03/05/22 0823 03/06/22 0513 03/06/22 1200 03/06/22 1916  HGB 8.6*  --  9.7* 9.0*  --  8.5*  --   --   HCT 28.2*  --  31.7* 29.5*  --  28.1*  --   --   PLT 93*  --  94* 90*  --  110*  --   --   HEPARINUNFRC 0.81*   < > 0.19* 0.32   < > 0.17* 0.24* 0.30  CREATININE 0.57  --   --  0.39*  --  0.32*  --   --    < > = values in this interval not displayed.    Estimated Creatinine Clearance: 71.8 mL/min (A) (by C-G formula based on SCr of 0.32 mg/dL (L)).  Medical History: Past Medical History:  Diagnosis Date   GERD (gastroesophageal reflux disease)    Multiple sclerosis (HCC)   Heparin Dosing Weight: 57.2 kg  Medications:  No home anticoagulation per pharmacist review.  Assessment: 61 yo female presented to ED with SOB when lying flat and some right sided rib and shoulder pain.  Chest CT showed acute PE.  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 Heparin was held in setting of hemoptysis on 11/19 .   Hgb 9.3-->6.6>(s/p 2u PRBC)>8.4>8.8>9.7, PLT wnl --s/p thrombectomy 11/15  Goal of Therapy:  Heparin level 0.3-0.5 units/ml (NO BOLUSES) Monitor platelets by anticoagulation protocol: Yes   Date Time aPTT/HL Rate/Comment 1117  2311  0.21  subtherapeutic; 1150 un/hr .... Removed d/t length of note, see older entry if needed... 1122 0833 0.51  Supratherapeutic 1200>1150 un/hr 1122 1834 0.19  Subtherapeutic 1150>1200 un/hr  1123    0224    0.32                Therapeutic X 1  1124    0513    0.17                SUBtherapeutic 1200 > 1300 un/hr 1124 1200 0.24  SUBtherapeutic 1300 > 1350 un/hr 1124    1913   0.30                 Therapeutic x 1  Plan:  11/24:  HL @  1913 = 0.3, therapeutic x 1  Will continue drip rate at 1350 units/hr and recheck HL in 6 hrs for confirmation.  --continue daily CBC while on IV heparin --Follow-up transition to oral anticoagulation  12/24, PharmD, BCPS 03/06/2022 7:40 PM

## 2022-03-06 NOTE — Consult Note (Signed)
ANTICOAGULATION CONSULT NOTE  Pharmacy Consult for Heparin Drip Indication: pulmonary embolus  Patient Measurements: Height: 5\' 2"  (157.5 cm) Weight: 73.1 kg (161 lb 2.5 oz) IBW/kg (Calculated) : 50.1 Heparin Dosing Weight: 57.2 kg  Labs: Recent Labs    03/04/22 0123 03/04/22 0833 03/04/22 1834 03/05/22 0224 03/05/22 0823 03/06/22 0513 03/06/22 1200  HGB 8.6*  --  9.7* 9.0*  --  8.5*  --   HCT 28.2*  --  31.7* 29.5*  --  28.1*  --   PLT 93*  --  94* 90*  --  110*  --   HEPARINUNFRC 0.81*   < > 0.19* 0.32 0.37 0.17* 0.24*  CREATININE 0.57  --   --  0.39*  --  0.32*  --    < > = values in this interval not displayed.    Estimated Creatinine Clearance: 71.8 mL/min (A) (by C-G formula based on SCr of 0.32 mg/dL (L)).  Medical History: Past Medical History:  Diagnosis Date   GERD (gastroesophageal reflux disease)    Multiple sclerosis (HCC)   Heparin Dosing Weight: 57.2 kg  Medications:  No home anticoagulation per pharmacist review.  Assessment: 61 yo female presented to ED with SOB when lying flat and some right sided rib and shoulder pain.  Chest CT showed acute PE.  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 Heparin was held in setting of hemoptysis on 11/19 .   Hgb 9.3-->6.6>(s/p 2u PRBC)>8.4>8.8>9.7, PLT wnl --s/p thrombectomy 11/15  Goal of Therapy:  Heparin level 0.3-0.5 units/ml (NO BOLUSES) Monitor platelets by anticoagulation protocol: Yes   Date Time aPTT/HL Rate/Comment 1117  2311  0.21  subtherapeutic; 1150 un/hr .... Removed d/t length of note, see older entry if needed... 1122 0833 0.51  Supratherapeutic 1200>1150 un/hr 1122 1834 0.19  Subtherapeutic 1150>1200 un/hr  1123    0224    0.32                Therapeutic X 1  1124    0513    0.17                SUBtherapeutic 1200 > 1300 un/hr 1124 1200 0.24  SUBtherapeutic 1300 > 1350 un/hr  Plan:  11/24:  HL @ 1200 = 0.24, SUBtherapeutic  Will increase drip rate to 1350 units/hr and recheck HL 6 hrs  after rate change.  --continue daily CBC while on IV heparin --Follow-up transition to oral anticoagulation  12/24  03/06/2022 12:38 PM

## 2022-03-06 NOTE — Progress Notes (Signed)
Dr.Dgayli made aware of patients heart rate in 140's.No orders given.Will cont. To assess.

## 2022-03-06 NOTE — Progress Notes (Signed)
Spoke with sister Windell Moulding and her husband at length regarding patient's plan of care. Windell Moulding stated that she would like to be present during patient's next SBT and that she would like to be called BEFORE next awakening trial.

## 2022-03-07 ENCOUNTER — Inpatient Hospital Stay: Payer: Medicare Other

## 2022-03-07 DIAGNOSIS — G35 Multiple sclerosis: Secondary | ICD-10-CM | POA: Diagnosis not present

## 2022-03-07 DIAGNOSIS — I2699 Other pulmonary embolism without acute cor pulmonale: Secondary | ICD-10-CM | POA: Diagnosis not present

## 2022-03-07 DIAGNOSIS — R0489 Hemorrhage from other sites in respiratory passages: Secondary | ICD-10-CM | POA: Diagnosis not present

## 2022-03-07 DIAGNOSIS — J9601 Acute respiratory failure with hypoxia: Secondary | ICD-10-CM | POA: Diagnosis not present

## 2022-03-07 LAB — BLOOD GAS, ARTERIAL
Acid-Base Excess: 17.8 mmol/L — ABNORMAL HIGH (ref 0.0–2.0)
Bicarbonate: 41.3 mmol/L — ABNORMAL HIGH (ref 20.0–28.0)
FIO2: 40 %
MECHVT: 440 mL
Mechanical Rate: 16
O2 Saturation: 98.6 %
PEEP: 5 cmH2O
Patient temperature: 37
RATE: 16 resp/min
pCO2 arterial: 43 mmHg (ref 32–48)
pH, Arterial: 7.59 — ABNORMAL HIGH (ref 7.35–7.45)
pO2, Arterial: 87 mmHg (ref 83–108)

## 2022-03-07 LAB — MAGNESIUM: Magnesium: 2.2 mg/dL (ref 1.7–2.4)

## 2022-03-07 LAB — GLUCOSE, CAPILLARY
Glucose-Capillary: 123 mg/dL — ABNORMAL HIGH (ref 70–99)
Glucose-Capillary: 148 mg/dL — ABNORMAL HIGH (ref 70–99)
Glucose-Capillary: 149 mg/dL — ABNORMAL HIGH (ref 70–99)
Glucose-Capillary: 156 mg/dL — ABNORMAL HIGH (ref 70–99)
Glucose-Capillary: 94 mg/dL (ref 70–99)
Glucose-Capillary: 99 mg/dL (ref 70–99)

## 2022-03-07 LAB — CBC
HCT: 27.1 % — ABNORMAL LOW (ref 36.0–46.0)
Hemoglobin: 8.3 g/dL — ABNORMAL LOW (ref 12.0–15.0)
MCH: 28.7 pg (ref 26.0–34.0)
MCHC: 30.6 g/dL (ref 30.0–36.0)
MCV: 93.8 fL (ref 80.0–100.0)
Platelets: 135 10*3/uL — ABNORMAL LOW (ref 150–400)
RBC: 2.89 MIL/uL — ABNORMAL LOW (ref 3.87–5.11)
RDW: 19.5 % — ABNORMAL HIGH (ref 11.5–15.5)
WBC: 27.4 10*3/uL — ABNORMAL HIGH (ref 4.0–10.5)
nRBC: 0.2 % (ref 0.0–0.2)

## 2022-03-07 LAB — BASIC METABOLIC PANEL
Anion gap: 13 (ref 5–15)
BUN: 23 mg/dL — ABNORMAL HIGH (ref 6–20)
CO2: 30 mmol/L (ref 22–32)
Calcium: 8.9 mg/dL (ref 8.9–10.3)
Chloride: 95 mmol/L — ABNORMAL LOW (ref 98–111)
Creatinine, Ser: 0.4 mg/dL — ABNORMAL LOW (ref 0.44–1.00)
GFR, Estimated: >60 mL/min (ref >60)
Glucose, Bld: 110 mg/dL — ABNORMAL HIGH (ref 70–99)
Potassium: 3.8 mmol/L (ref 3.5–5.1)
Sodium: 138 mmol/L (ref 135–145)

## 2022-03-07 LAB — HEPARIN LEVEL (UNFRACTIONATED)
Heparin Unfractionated: 0.15 IU/mL — ABNORMAL LOW (ref 0.30–0.70)
Heparin Unfractionated: 0.21 IU/mL — ABNORMAL LOW (ref 0.30–0.70)
Heparin Unfractionated: 0.31 IU/mL (ref 0.30–0.70)
Heparin Unfractionated: 0.32 IU/mL (ref 0.30–0.70)

## 2022-03-07 LAB — PHOSPHORUS: Phosphorus: 3 mg/dL (ref 2.5–4.6)

## 2022-03-07 MED ORDER — PREGABALIN 75 MG PO CAPS
75.0000 mg | ORAL_CAPSULE | Freq: Every day | ORAL | Status: DC
  Administered 2022-03-08: 75 mg
  Filled 2022-03-07 (×2): qty 1

## 2022-03-07 MED ORDER — HYDROMORPHONE HCL 1 MG/ML IJ SOLN
1.0000 mg | INTRAMUSCULAR | Status: DC | PRN
  Administered 2022-03-07 – 2022-03-15 (×6): 1 mg via INTRAVENOUS
  Filled 2022-03-07 (×8): qty 1

## 2022-03-07 MED ORDER — FREE WATER
100.0000 mL | Freq: Four times a day (QID) | Status: DC
  Administered 2022-03-08 – 2022-03-16 (×35): 100 mL

## 2022-03-07 MED ORDER — FUROSEMIDE 10 MG/ML IJ SOLN
20.0000 mg | Freq: Once | INTRAMUSCULAR | Status: AC
  Administered 2022-03-07: 20 mg via INTRAVENOUS
  Filled 2022-03-07: qty 2

## 2022-03-07 NOTE — Consult Note (Signed)
ANTICOAGULATION CONSULT NOTE  Pharmacy Consult for Heparin Drip Indication: pulmonary embolus  Patient Measurements: Height: 5\' 2"  (157.5 cm) Weight: 71.2 kg (156 lb 15.5 oz) IBW/kg (Calculated) : 50.1 Heparin Dosing Weight: 57.2 kg  Labs: Recent Labs    03/05/22 0224 03/05/22 0823 03/06/22 0513 03/06/22 1200 03/07/22 0059 03/07/22 0423 03/07/22 0755 03/07/22 1639  HGB 9.0*  --  8.5*  --   --  8.3*  --   --   HCT 29.5*  --  28.1*  --   --  27.1*  --   --   PLT 90*  --  110*  --   --  135*  --   --   HEPARINUNFRC 0.32   < > 0.17*   < > 0.21*  --  0.15* 0.31  CREATININE 0.39*  --  0.32*  --   --  0.40*  --   --    < > = values in this interval not displayed.    Estimated Creatinine Clearance: 70.8 mL/min (A) (by C-G formula based on SCr of 0.4 mg/dL (L)).  Medical History: Past Medical History:  Diagnosis Date   GERD (gastroesophageal reflux disease)    Multiple sclerosis (HCC)   Heparin Dosing Weight: 57.2 kg  Medications:  No home anticoagulation per pharmacist review.  Assessment: 61 yo female presented to ED with SOB when lying flat and some right sided rib and shoulder pain.  Chest CT showed acute PE.  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 Heparin was held in setting of hemoptysis on 11/19 .   Hgb 8.5>8.3, PLT 110>135 --s/p thrombectomy 11/15  Goal of Therapy:  Heparin level 0.3-0.5 units/ml (NO BOLUSES) Monitor platelets by anticoagulation protocol: Yes   Date Time aPTT/HL Rate/Comment 1117  2311  0.21  subtherapeutic; 1150 un/hr .... Removed d/t length of note, see older entry if needed... 1122 0833 0.51  Supratherapeutic 1200>1150 un/hr 1122 1834 0.19  Subtherapeutic 1150>1200 un/hr  1123    0224    0.32                Therapeutic X 1  1124    0513    0.17                SUBtherapeutic 1200 > 1300 un/hr 1124 1200 0.24  SUBtherapeutic 1300 > 1350 un/hr 1124    1913   0.30                 Therapeutic x 1 1125    0059    0.21                 SUBtherapeutic @ 1350>1400 un/hr 1125 0755 0.15  SUBtherapeutic 1400 > 1500 un/hr 11/25 1639 0.31  Therapeutic x1  Plan:  -Continue heparin drip at 1500 units/hr  -Recheck HL 6 hrs after rate change.  -Continue daily CBC while on IV heparin -Follow-up transition to oral anticoagulation  12/25, PharmD 03/07/2022 5:06 PM

## 2022-03-07 NOTE — Progress Notes (Addendum)
Patient tachycardic throughout shift with heart rate ranging from 130s-150s. MD made aware of ongoing tachycardia throughout shift. Orders placed for PRN dilaudid for pain.

## 2022-03-07 NOTE — Consult Note (Signed)
ANTICOAGULATION CONSULT NOTE  Pharmacy Consult for Heparin Drip Indication: pulmonary embolus  Patient Measurements: Height: 5\' 2"  (157.5 cm) Weight: 71.2 kg (156 lb 15.5 oz) IBW/kg (Calculated) : 50.1 Heparin Dosing Weight: 57.2 kg  Labs: Recent Labs    03/05/22 0224 03/05/22 0823 03/06/22 0513 03/06/22 1200 03/06/22 1916 03/07/22 0059 03/07/22 0423 03/07/22 0755  HGB 9.0*  --  8.5*  --   --   --  8.3*  --   HCT 29.5*  --  28.1*  --   --   --  27.1*  --   PLT 90*  --  110*  --   --   --  135*  --   HEPARINUNFRC 0.32   < > 0.17*   < > 0.30 0.21*  --  0.15*  CREATININE 0.39*  --  0.32*  --   --   --  0.40*  --    < > = values in this interval not displayed.    Estimated Creatinine Clearance: 70.8 mL/min (A) (by C-G formula based on SCr of 0.4 mg/dL (L)).  Medical History: Past Medical History:  Diagnosis Date   GERD (gastroesophageal reflux disease)    Multiple sclerosis (HCC)   Heparin Dosing Weight: 57.2 kg  Medications:  No home anticoagulation per pharmacist review.  Assessment: 61 yo female presented to ED with SOB when lying flat and some right sided rib and shoulder pain.  Chest CT showed acute PE.  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 Heparin was held in setting of hemoptysis on 11/19 .   Hgb 8.5>8.3, PLT 110>135 --s/p thrombectomy 11/15  Goal of Therapy:  Heparin level 0.3-0.5 units/ml (NO BOLUSES) Monitor platelets by anticoagulation protocol: Yes   Date Time aPTT/HL Rate/Comment 1117  2311  0.21  subtherapeutic; 1150 un/hr .... Removed d/t length of note, see older entry if needed... 1122 0833 0.51  Supratherapeutic 1200>1150 un/hr 1122 1834 0.19  Subtherapeutic 1150>1200 un/hr  1123    0224    0.32                Therapeutic X 1  1124    0513    0.17                SUBtherapeutic 1200 > 1300 un/hr 1124 1200 0.24  SUBtherapeutic 1300 > 1350 un/hr 1124    1913   0.30                 Therapeutic x 1 1125    0059    0.21                 SUBtherapeutic @ 1350>1400 un/hr 1125 0755 0.15  SUBtherapeutic 1400 > 1500 un/hr  Plan:  11/25:  HL @ 0059 = 0.15, SUBtherapeutic. No documented interruptions in infusion. Will increase drip rate to 1500 units/hr and recheck HL 6 hrs after rate change.  --continue daily CBC while on IV heparin --Follow-up transition to oral anticoagulation  12/25 03/07/2022 9:31 AM

## 2022-03-07 NOTE — Progress Notes (Signed)
PHARMACY CONSULT NOTE  Pharmacy Consult for Electrolyte Monitoring and Replacement   Recent Labs: Potassium (mmol/L)  Date Value  03/07/2022 3.8   Magnesium (mg/dL)  Date Value  29/51/8841 2.2   Calcium (mg/dL)  Date Value  66/09/3014 8.9   Albumin (g/dL)  Date Value  04/21/3233 2.7 (L)   Phosphorus (mg/dL)  Date Value  57/32/2025 3.0   Sodium (mmol/L)  Date Value  03/07/2022 138   Assessment: 61 yo female presented to ED with SOB when lying flat and some right sided rib and shoulder pain.  Chest CT showed acute PE.  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 Pharmacy is asked to follow and replace electrolytes while in CCU.  Nutrition: Tube feeds @50ml /h, FWF 100 q4h, NS KVO  Goal of Therapy:  Electrolytes within normal limits  Plan:  Receiving Lasix 20mg  IV 1 (11/25) All lytes WNL at present not requiring repletion at this time. Follow-up electrolytes with AM labs tomorrow  03/07/2022 9:38 AM

## 2022-03-07 NOTE — Consult Note (Signed)
ANTICOAGULATION CONSULT NOTE  Pharmacy Consult for Heparin Drip Indication: pulmonary embolus  Patient Measurements: Height: 5\' 2"  (157.5 cm) Weight: 73.1 kg (161 lb 2.5 oz) IBW/kg (Calculated) : 50.1 Heparin Dosing Weight: 57.2 kg  Labs: Recent Labs    03/04/22 1834 03/05/22 0224 03/05/22 0823 03/06/22 0513 03/06/22 1200 03/06/22 1916 03/07/22 0059  HGB 9.7* 9.0*  --  8.5*  --   --   --   HCT 31.7* 29.5*  --  28.1*  --   --   --   PLT 94* 90*  --  110*  --   --   --   HEPARINUNFRC 0.19* 0.32   < > 0.17* 0.24* 0.30 0.21*  CREATININE  --  0.39*  --  0.32*  --   --   --    < > = values in this interval not displayed.    Estimated Creatinine Clearance: 71.8 mL/min (A) (by C-G formula based on SCr of 0.32 mg/dL (L)).  Medical History: Past Medical History:  Diagnosis Date   GERD (gastroesophageal reflux disease)    Multiple sclerosis (HCC)   Heparin Dosing Weight: 57.2 kg  Medications:  No home anticoagulation per pharmacist review.  Assessment: 61 yo female presented to ED with SOB when lying flat and some right sided rib and shoulder pain.  Chest CT showed acute PE.  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 Heparin was held in setting of hemoptysis on 11/19 .   Hgb 9.3-->6.6>(s/p 2u PRBC)>8.4>8.8>9.7, PLT wnl --s/p thrombectomy 11/15  Goal of Therapy:  Heparin level 0.3-0.5 units/ml (NO BOLUSES) Monitor platelets by anticoagulation protocol: Yes   Date Time aPTT/HL Rate/Comment 1117  2311  0.21  subtherapeutic; 1150 un/hr .... Removed d/t length of note, see older entry if needed... 1122 0833 0.51  Supratherapeutic 1200>1150 un/hr 1122 1834 0.19  Subtherapeutic 1150>1200 un/hr  1123    0224    0.32                Therapeutic X 1  1124    0513    0.17                SUBtherapeutic 1200 > 1300 un/hr 1124 1200 0.24  SUBtherapeutic 1300 > 1350 un/hr 1124    1913   0.30                 Therapeutic x 1 1125    0059    0.21                SUBtherapeutic @  1350  Plan:  11/25:  HL @ 0059 = 0.21, SUBtherapeutic  Will increase drip rate to 1400 units/hr and recheck HL 6 hrs after rate change.  --continue daily CBC while on IV heparin --Follow-up transition to oral anticoagulation  Brodie Correll D 03/07/2022 1:42 AM

## 2022-03-07 NOTE — Progress Notes (Signed)
NAME:  [name], MRN:  [id number], DOB:  03-24-1961, LOS: 13 ADMISSION DATE:  02/24/2022, CHIEF COMPLAINT:  hypoxic respiratory failure   History of Present Illness:  61 y.o. female admitted with Acute Hypoxic Respiratory Failure in setting of Bilateral Pulmonary Embolism.  Underwent thrombectomy with Vascular Surgery, post procedure developed Hemoptysis leading to severe blockage of airways requiring emergent intubation. S/p bronch with therapeutic aspiration of blood clots from tracheobronchial tree.  CTA PE: Was read as acute PE with moderate thrombus burden involving both lungs. Bilateral lower extremity venous doppler ultrasounds>>  occlusive thrombus in both peroneal veins.   Pertinent  Medical History  -GERD -MS  Significant Hospital Events: Including procedures, antibiotic start and stop dates in addition to other pertinent events   11/14: Admitted by Hospitalist.  Vascular Surgery consulted 11/15:  Thrombectomy performed, massive amounts of clot removed.  Post procedure developed Acute Hemoptysis requiring emergent intubation and transfer to ICU.  PCCM consulted, plan for emergent Bronchoscopy 11/15 3 hour BRONCH to extract clots 11/16  2 units PRBC's to be given.  Repeat Bronch. 11/17: Off pressors. Increasing FiO2 requirements (60% FiO2, 5 peep), CXR with increase in bilateral opacities concerning for PNA.  NO SBT today. Place OG and start feeds. 11/18: Heparin gtt restarted yesterday with no bolus, no hemoptysis reported and Hgb remains stable.  Slow improvement in FiO2 to 60% (from 75%) with diuresis yesterday, renal function remains normal so will diurese again today 11/19: Still with high vent requirements (60% FiO2, 10 PEEP), Heparin gtt d/c due to bmall volume bright red blood from ETT.  Collagen Vascular workup sent.  Holding diuresis due to increased Na+ & BUN.  Solumedrol increased to 40 mg BID 11/20: Slight improvement in vent requirements (50% FiO2, 8 PEEP).   Worsening Leukocytosis, tracheal aspirate results pending, start Zosyn. 11/21: repeat CT overnight, vomited while in CT. 11/22: no bleeding noted. 11/23: tolerated SBT and sedation holiday - mental status continues to be sluggish 11/24: tolerated SBT and sedation holiday  Interim History / Subjective:  No acute events overnight.  Objective   Blood pressure 122/67, pulse (!) 148, temperature 100 F (37.8 C), resp. rate (!) 30, height 5\' 2"  (1.575 m), weight 71.2 kg, SpO2 97 %.    Vent Mode: PRVC FiO2 (%):  [35 %] 35 % Set Rate:  [16 bmp] 16 bmp Vt Set:  [440 mL] 440 mL PEEP:  [5 cmH20] 5 cmH20 Plateau Pressure:  [15 cmH20-19 cmH20] 19 cmH20   Intake/Output Summary (Last 24 hours) at 03/07/2022 0844 Last data filed at 03/07/2022 0815 Gross per 24 hour  Intake 1334.86 ml  Output 2505 ml  Net -1170.14 ml    Filed Weights   03/05/22 0500 03/06/22 0500 03/07/22 0500  Weight: 72.9 kg 73.1 kg 71.2 kg    Examination: Physical Exam Vitals reviewed.  Constitutional:      General: She is not in acute distress.    Appearance: She is ill-appearing.  HENT:     Mouth/Throat:     Mouth: Mucous membranes are moist.  Cardiovascular:     Rate and Rhythm: Normal rate and regular rhythm.  Pulmonary:     Comments: Ventilated breath sounds Abdominal:     General: There is no distension.     Palpations: Abdomen is soft.  Musculoskeletal:     Cervical back: Normal range of motion and neck supple.  Neurological:     Mental Status: She is disoriented.     Assessment & Plan:  61 year old female presenting with acute hypoxic respiratory failure secondary to pulmonary embolism complicated by pulmonary hemorrhage following thrombectomy. She is now intubated and mechanically ventilated.   Neurology #Sedation  Switched sedation to propofol and hydromorphone for analgosedation and she has tolerated this well.   -daily sedation holiday, will titrate down as tolerated -RASS goal of  0 -Head CT today   Pulmonary #Acute Hypoxic Respiratory Failure #Pulmonary Embolism #Pulmonary Hemorrhage #VAP  Presents with hypoxic respiratory failure and found to have pulmonary emboli for which she underwent thrombectomy by vascular surgery. She developed severe hemoptysis secondary to pulmonary hemorrhage following her procedure requiring intubation and mechanical ventilation. She required multiple bronchs to clear airway secretions and clots, and course further complicated by HAP. Eustace workup sent, ANA/ANCA/CCP negative, unclear significance of mildly positive RF. Pulmonary hemorrhage likely spontaneous in the setting of thrombectomy and anticoagulation. No AKI to suggest anti-GBM syndrome. She has tolerated a re-challenge with heparin well over the past 48 hours. Will continue to wean off the ventilator and assess appropriateness for SBT and vent weaning. Low suspicion for vasculitis, steroids discontinued.  -follow up respiratory cultures -Daily SBT -ABG today  Cardiovascular #Pulmonary Embolus  TTE with normal EF and no RV dysfunction or dilation. So far tolerating heparin gtt re-challenge. No indication for thrombolysis.   GI Tube feeds, held due to ileus; reinitiated at trickle and titrated up to goal. Famotidine for prophylaxis   Renal  Kidney function at baseline, continue to monitor. Will administer 20 mg of furosemide today for diuresis.  -gentle diuresis  Hem/Onc  Continue heparin gtt. Monitor CBC bid for bleeding  ID #HAP  Rising white count and left sided infiltrate on CXR. On broad spectrum antibiotics pending respiratory and blood cultures. Did develop a fever and blood cultures re-sent. COVID positive, unclear significance as reportedly had covid a few weeks ago  -continue broad spectrum antibiotics -await culture results  Best Practice (right click and "Reselect all SmartList Selections" daily)   Diet/type: tubefeeds DVT prophylaxis: systemic  heparin GI prophylaxis: H2B Lines: N/A Foley:  Yes, and it is still needed Code Status:  full code Last date of multidisciplinary goals of care discussion [03/07/2022]  Labs   CBC: Recent Labs  Lab 03/03/22 1232 03/04/22 0123 03/04/22 1834 03/05/22 0224 03/06/22 0513 03/07/22 0423  WBC 32.8* 31.1* 36.5* 32.9* 27.8* 27.4*  NEUTROABS 17.7*  --  22.6*  --   --   --   HGB 10.9* 8.6* 9.7* 9.0* 8.5* 8.3*  HCT 35.6* 28.2* 31.7* 29.5* 28.1* 27.1*  MCV 95.2 93.4 94.1 95.2 94.9 93.8  PLT 135* 93* 94* 90* 110* 135*     Basic Metabolic Panel: Recent Labs  Lab 03/02/22 0647 03/03/22 0406 03/04/22 0123 03/05/22 0224 03/06/22 0513 03/07/22 0423  NA 145 144 139 140 139 138  K 4.8 4.2 4.0 4.3 4.2 3.8  CL 100 99 98 100 97* 95*  CO2 36* 38* 35* 37* 32 30  GLUCOSE 131* 123* 109* 124* 108* 110*  BUN 28* 35* 36* 34* 26* 23*  CREATININE 0.40* 0.47 0.57 0.39* 0.32* 0.40*  CALCIUM 9.3 9.1 8.4* 8.8* 8.6* 8.9  MG  --  2.2 2.4 2.2  --  2.2  PHOS 3.8 3.5 4.6 2.7  --  3.0    GFR: Estimated Creatinine Clearance: 70.8 mL/min (A) (by C-G formula based on SCr of 0.4 mg/dL (L)). Recent Labs  Lab 03/01/22 0501 03/02/22 9562 03/03/22 0406 03/03/22 1916 03/04/22 0123 03/04/22 1834 03/05/22 0224 03/06/22  7858 03/07/22 0423  PROCALCITON 1.15 0.68  --   --   --   --   --   --   --   WBC 19.8* 24.5*   < >  --    < > 36.5* 32.9* 27.8* 27.4*  LATICACIDVEN  --   --   --  1.4  --   --   --   --   --    < > = values in this interval not displayed.     Liver Function Tests: Recent Labs  Lab 03/01/22 0501 03/02/22 0647 03/03/22 1232  AST  --   --  36  ALT  --   --  44  ALKPHOS  --   --  119  BILITOT  --   --  1.0  PROT  --   --  5.4*  ALBUMIN 2.6* 2.6* 2.7*    No results for input(s): "LIPASE", "AMYLASE" in the last 168 hours. No results for input(s): "AMMONIA" in the last 168 hours.  ABG    Component Value Date/Time   PHART 7.43 03/04/2022 0500   PCO2ART 62 (H) 03/04/2022 0500    PO2ART 83 03/04/2022 0500   HCO3 41.2 (H) 03/04/2022 0500   ACIDBASEDEF 4.0 (H) 02/26/2022 0307   O2SAT 96.9 03/04/2022 0500     Coagulation Profile: No results for input(s): "INR", "PROTIME" in the last 168 hours.   Cardiac Enzymes: No results for input(s): "CKTOTAL", "CKMB", "CKMBINDEX", "TROPONINI" in the last 168 hours.  HbA1C: No results found for: "HGBA1C"  CBG: Recent Labs  Lab 03/06/22 1709 03/06/22 1921 03/06/22 2330 03/07/22 0323 03/07/22 0801  GLUCAP 152* 124* 145* 99 94     Review of Systems:   Unable to obtain  Past Medical History:  She,  has a past medical history of GERD (gastroesophageal reflux disease) and Multiple sclerosis (HCC).   Surgical History:   Past Surgical History:  Procedure Laterality Date   ESOPHAGOGASTRODUODENOSCOPY (EGD) WITH PROPOFOL N/A 09/11/2021   Procedure: ESOPHAGOGASTRODUODENOSCOPY (EGD) WITH PROPOFOL;  Surgeon: Jaynie Collins, DO;  Location: [facility] ENDOSCOPY;  Service: Gastroenterology;  Laterality: N/A;   PULMONARY THROMBECTOMY Bilateral 02/25/2022   Procedure: PULMONARY THROMBECTOMY;  Surgeon: Annice Needy, MD;  Location: [facility] INVASIVE CV LAB;  Service: Cardiovascular;  Laterality: Bilateral;   TUBAL LIGATION       Social History:   reports that she has never smoked. She has never used smokeless tobacco. She reports current alcohol use. She reports that she does not use drugs.   Family History:  Her family history is not on file.   Allergies Allergies  Allergen Reactions   Erythromycin Hives and Nausea And Vomiting   Lexapro [Escitalopram Oxalate] Hives     Home Medications  Prior to Admission medications   Medication Sig Start Date End Date Taking? Authorizing Provider  cyanocobalamin 1000 MCG tablet Take by mouth.   Yes [provider]  DULoxetine (CYMBALTA) 30 MG capsule Take 30 mg by mouth daily. 06/25/21  Yes [provider]  gabapentin (NEURONTIN) 400 MG capsule Take Gabapentin 400  mg in the morning, 400 mg in the afternoon, and 800 mg at night 12/29/21  Yes [provider]  omeprazole (PRILOSEC) 20 MG capsule Take 20 mg by mouth daily. 06/10/21  Yes [provider]  propranolol (INDERAL) 20 MG tablet Take 1 tablet by mouth 2 (two) times daily. 12/16/21  Yes [provider]  rOPINIRole (REQUIP) 0.25 MG tablet Take 0.25 mg at night  for 1 week, then increase to 0.5 mg at night 05/29/21  Yes [provider]  tolterodine (DETROL LA) 2 MG 24 hr capsule Take 2 mg by mouth 2 (two) times daily. 12/29/21 12/29/22 Yes [provider]  ascorbic acid (VITAMIN C) 500 MG tablet Take by mouth.    [provider]  Cholecalciferol 50 MCG (2000 UT) CAPS Take by mouth.    [provider]  cyclobenzaprine (FLEXERIL) 10 MG tablet Take 10 mg by mouth 2 (two) times daily as needed. 11/26/21   [provider]  dalfampridine 10 MG TB12 Take 1 tablet by mouth every 12 (twelve) hours. 12/29/21   [provider]  meloxicam (MOBIC) 15 MG tablet Take 1 tablet (15 mg total) by mouth daily. 08/23/21 08/23/22  Cuthriell, Charline Bills, PA-C  methylPREDNISolone (MEDROL DOSEPAK) 4 MG TBPK tablet Take by mouth. 11/26/21   [provider]  naproxen (NAPROSYN) 500 MG tablet Take by mouth.    [provider]  nitrofurantoin, macrocrystal-monohydrate, (MACROBID) 100 MG capsule Take 1 capsule (100 mg total) by mouth 2 (two) times daily. 01/19/22   Immordino, Annie Main, FNP  pregabalin (LYRICA) 75 MG capsule Take 75 mg by mouth 3 (three) times daily. 08/18/21   [provider]  traMADol (ULTRAM) 50 MG tablet Take 50 mg by mouth 3 (three) times daily as needed. 12/26/21   [provider]     Critical care time: 54 minutes

## 2022-03-07 NOTE — Progress Notes (Signed)
Patient transported to and from CT with this RN- no complications

## 2022-03-08 ENCOUNTER — Inpatient Hospital Stay: Payer: Medicare Other

## 2022-03-08 DIAGNOSIS — R000 Tachycardia, unspecified: Secondary | ICD-10-CM

## 2022-03-08 DIAGNOSIS — R0489 Hemorrhage from other sites in respiratory passages: Secondary | ICD-10-CM | POA: Diagnosis not present

## 2022-03-08 DIAGNOSIS — I2699 Other pulmonary embolism without acute cor pulmonale: Secondary | ICD-10-CM | POA: Diagnosis not present

## 2022-03-08 DIAGNOSIS — A419 Sepsis, unspecified organism: Secondary | ICD-10-CM | POA: Diagnosis not present

## 2022-03-08 DIAGNOSIS — J9601 Acute respiratory failure with hypoxia: Secondary | ICD-10-CM | POA: Diagnosis not present

## 2022-03-08 DIAGNOSIS — R509 Fever, unspecified: Secondary | ICD-10-CM

## 2022-03-08 LAB — CULTURE, BLOOD (ROUTINE X 2)
Culture: NO GROWTH
Culture: NO GROWTH
Special Requests: ADEQUATE
Special Requests: ADEQUATE

## 2022-03-08 LAB — BASIC METABOLIC PANEL
Anion gap: 8 (ref 5–15)
BUN: 18 mg/dL (ref 6–20)
CO2: 34 mmol/L — ABNORMAL HIGH (ref 22–32)
Calcium: 8.5 mg/dL — ABNORMAL LOW (ref 8.9–10.3)
Chloride: 100 mmol/L (ref 98–111)
Creatinine, Ser: 0.34 mg/dL — ABNORMAL LOW (ref 0.44–1.00)
GFR, Estimated: >60 mL/min (ref >60)
Glucose, Bld: 129 mg/dL — ABNORMAL HIGH (ref 70–99)
Potassium: 3.1 mmol/L — ABNORMAL LOW (ref 3.5–5.1)
Sodium: 142 mmol/L (ref 135–145)

## 2022-03-08 LAB — CBC WITH DIFFERENTIAL/PLATELET
Abs Immature Granulocytes: 3.59 10*3/uL — ABNORMAL HIGH (ref 0.00–0.07)
Abs Immature Granulocytes: 4.41 10*3/uL — ABNORMAL HIGH (ref 0.00–0.07)
Basophils Absolute: 0.2 10*3/uL — ABNORMAL HIGH (ref 0.0–0.1)
Basophils Absolute: 0.2 10*3/uL — ABNORMAL HIGH (ref 0.0–0.1)
Basophils Relative: 1 %
Basophils Relative: 1 %
Eosinophils Absolute: 0.2 10*3/uL (ref 0.0–0.5)
Eosinophils Absolute: 0.1 10*3/uL (ref 0.0–0.5)
Eosinophils Relative: 1 %
Eosinophils Relative: 0 %
HCT: 24.5 % — ABNORMAL LOW (ref 36.0–46.0)
HCT: 25.4 % — ABNORMAL LOW (ref 36.0–46.0)
Hemoglobin: 7.5 g/dL — ABNORMAL LOW (ref 12.0–15.0)
Hemoglobin: 7.8 g/dL — ABNORMAL LOW (ref 12.0–15.0)
Immature Granulocytes: 10 %
Immature Granulocytes: 11 %
Lymphocytes Relative: 3 %
Lymphocytes Relative: 3 %
Lymphs Abs: 0.9 10*3/uL (ref 0.7–4.0)
Lymphs Abs: 1.1 10*3/uL (ref 0.7–4.0)
MCH: 28.5 pg (ref 26.0–34.0)
MCH: 28.8 pg (ref 26.0–34.0)
MCHC: 30.6 g/dL (ref 30.0–36.0)
MCHC: 30.7 g/dL (ref 30.0–36.0)
MCV: 93.2 fL (ref 80.0–100.0)
MCV: 93.7 fL (ref 80.0–100.0)
Monocytes Absolute: 7.8 10*3/uL — ABNORMAL HIGH (ref 0.1–1.0)
Monocytes Absolute: 7.9 10*3/uL — ABNORMAL HIGH (ref 0.1–1.0)
Monocytes Relative: 21 %
Monocytes Relative: 20 %
Neutro Abs: 23.9 10*3/uL — ABNORMAL HIGH (ref 1.7–7.7)
Neutro Abs: 25.9 10*3/uL — ABNORMAL HIGH (ref 1.7–7.7)
Neutrophils Relative %: 64 %
Neutrophils Relative %: 65 %
Platelets: 189 10*3/uL (ref 150–400)
Platelets: 241 10*3/uL (ref 150–400)
RBC: 2.63 MIL/uL — ABNORMAL LOW (ref 3.87–5.11)
RBC: 2.71 MIL/uL — ABNORMAL LOW (ref 3.87–5.11)
RDW: 19.9 % — ABNORMAL HIGH (ref 11.5–15.5)
RDW: 20 % — ABNORMAL HIGH (ref 11.5–15.5)
Smear Review: NORMAL
Smear Review: NORMAL
WBC: 36.3 10*3/uL — ABNORMAL HIGH (ref 4.0–10.5)
WBC: 39.5 10*3/uL — ABNORMAL HIGH (ref 4.0–10.5)
nRBC: 0.3 % — ABNORMAL HIGH (ref 0.0–0.2)
nRBC: 0.7 % — ABNORMAL HIGH (ref 0.0–0.2)

## 2022-03-08 LAB — GLUCOSE, CAPILLARY
Glucose-Capillary: 83 mg/dL (ref 70–99)
Glucose-Capillary: 129 mg/dL — ABNORMAL HIGH (ref 70–99)
Glucose-Capillary: 159 mg/dL — ABNORMAL HIGH (ref 70–99)
Glucose-Capillary: 151 mg/dL — ABNORMAL HIGH (ref 70–99)
Glucose-Capillary: 140 mg/dL — ABNORMAL HIGH (ref 70–99)

## 2022-03-08 LAB — HEPATIC FUNCTION PANEL
ALT: 48 U/L — ABNORMAL HIGH (ref 0–44)
AST: 44 U/L — ABNORMAL HIGH (ref 15–41)
Albumin: 2.4 g/dL — ABNORMAL LOW (ref 3.5–5.0)
Alkaline Phosphatase: 215 U/L — ABNORMAL HIGH (ref 38–126)
Bilirubin, Direct: 0.4 mg/dL — ABNORMAL HIGH (ref 0.0–0.2)
Indirect Bilirubin: 0.5 mg/dL (ref 0.3–0.9)
Total Bilirubin: 0.9 mg/dL (ref 0.3–1.2)
Total Protein: 5.6 g/dL — ABNORMAL LOW (ref 6.5–8.1)

## 2022-03-08 LAB — PHOSPHORUS: Phosphorus: 1.9 mg/dL — ABNORMAL LOW (ref 2.5–4.6)

## 2022-03-08 LAB — MAGNESIUM: Magnesium: 2.2 mg/dL (ref 1.7–2.4)

## 2022-03-08 LAB — HEPARIN LEVEL (UNFRACTIONATED)
Heparin Unfractionated: 0.16 IU/mL — ABNORMAL LOW (ref 0.30–0.70)
Heparin Unfractionated: 0.28 IU/mL — ABNORMAL LOW (ref 0.30–0.70)

## 2022-03-08 LAB — CBC
HCT: 28.2 % — ABNORMAL LOW (ref 36.0–46.0)
Hemoglobin: 8.5 g/dL — ABNORMAL LOW (ref 12.0–15.0)
MCH: 28.6 pg (ref 26.0–34.0)
MCHC: 30.1 g/dL (ref 30.0–36.0)
MCV: 94.9 fL (ref 80.0–100.0)
Platelets: 178 10*3/uL (ref 150–400)
RBC: 2.97 MIL/uL — ABNORMAL LOW (ref 3.87–5.11)
RDW: 19.7 % — ABNORMAL HIGH (ref 11.5–15.5)
WBC: 42.5 10*3/uL — ABNORMAL HIGH (ref 4.0–10.5)
nRBC: 0.5 % — ABNORMAL HIGH (ref 0.0–0.2)

## 2022-03-08 LAB — LACTATE DEHYDROGENASE
LDH: 294 U/L — ABNORMAL HIGH (ref 98–192)
LDH: 419 U/L — ABNORMAL HIGH (ref 98–192)

## 2022-03-08 LAB — C DIFFICILE (CDIFF) QUICK SCRN (NO PCR REFLEX)
C Diff antigen: NEGATIVE
C Diff interpretation: NOT DETECTED
C Diff toxin: NEGATIVE

## 2022-03-08 LAB — PROCALCITONIN: Procalcitonin: 1.45 ng/mL

## 2022-03-08 LAB — MRSA NEXT GEN BY PCR, NASAL: MRSA by PCR Next Gen: NOT DETECTED

## 2022-03-08 MED ORDER — PROPRANOLOL HCL 20 MG PO TABS
20.0000 mg | ORAL_TABLET | Freq: Two times a day (BID) | ORAL | Status: DC
  Administered 2022-03-08 – 2022-03-12 (×9): 20 mg
  Filled 2022-03-08 (×9): qty 1

## 2022-03-08 MED ORDER — SODIUM CHLORIDE 0.9 % IV SOLN
1.0000 g | Freq: Three times a day (TID) | INTRAVENOUS | Status: DC
  Administered 2022-03-08 – 2022-03-11 (×8): 1 g via INTRAVENOUS
  Filled 2022-03-08: qty 20
  Filled 2022-03-08: qty 1
  Filled 2022-03-08: qty 20
  Filled 2022-03-08: qty 1
  Filled 2022-03-08 (×5): qty 20

## 2022-03-08 MED ORDER — PANTOPRAZOLE SODIUM 40 MG IV SOLR
40.0000 mg | INTRAVENOUS | Status: DC
  Administered 2022-03-09 – 2022-03-20 (×12): 40 mg via INTRAVENOUS
  Filled 2022-03-08 (×12): qty 10

## 2022-03-08 MED ORDER — POTASSIUM CHLORIDE 20 MEQ PO PACK
20.0000 meq | PACK | Freq: Once | ORAL | Status: AC
  Administered 2022-03-08: 20 meq
  Filled 2022-03-08: qty 1

## 2022-03-08 MED ORDER — LINEZOLID 600 MG/300ML IV SOLN
600.0000 mg | Freq: Two times a day (BID) | INTRAVENOUS | Status: DC
  Administered 2022-03-08 – 2022-03-10 (×5): 600 mg via INTRAVENOUS
  Filled 2022-03-08 (×6): qty 300

## 2022-03-08 MED ORDER — POTASSIUM PHOSPHATES 15 MMOLE/5ML IV SOLN
30.0000 mmol | Freq: Once | INTRAVENOUS | Status: AC
  Administered 2022-03-08: 30 mmol via INTRAVENOUS
  Filled 2022-03-08: qty 10

## 2022-03-08 NOTE — Consult Note (Signed)
ANTICOAGULATION CONSULT NOTE  Pharmacy Consult for Heparin Drip Indication: pulmonary embolus  Patient Measurements: Height: 5\' 2"  (157.5 cm) Weight: 71.2 kg (156 lb 15.5 oz) IBW/kg (Calculated) : 50.1 Heparin Dosing Weight: 57.2 kg  Labs: Recent Labs    03/05/22 0224 03/05/22 0823 03/06/22 0513 03/06/22 1200 03/07/22 0423 03/07/22 0755 03/07/22 1639 03/07/22 2329  HGB 9.0*  --  8.5*  --  8.3*  --   --   --   HCT 29.5*  --  28.1*  --  27.1*  --   --   --   PLT 90*  --  110*  --  135*  --   --   --   HEPARINUNFRC 0.32   < > 0.17*   < >  --  0.15* 0.31 0.32  CREATININE 0.39*  --  0.32*  --  0.40*  --   --   --    < > = values in this interval not displayed.    Estimated Creatinine Clearance: 70.8 mL/min (A) (by C-G formula based on SCr of 0.4 mg/dL (L)).  Medical History: Past Medical History:  Diagnosis Date   GERD (gastroesophageal reflux disease)    Multiple sclerosis (HCC)   Heparin Dosing Weight: 57.2 kg  Medications:  No home anticoagulation per pharmacist review.  Assessment: 61 yo female presented to ED with SOB when lying flat and some right sided rib and shoulder pain.  Chest CT showed acute PE.  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 Heparin was held in setting of hemoptysis on 11/19 .   Hgb 8.5>8.3, PLT 110>135 --s/p thrombectomy 11/15  Goal of Therapy:  Heparin level 0.3-0.5 units/ml (NO BOLUSES) Monitor platelets by anticoagulation protocol: Yes   Date Time aPTT/HL Rate/Comment 1117  2311  0.21  subtherapeutic; 1150 un/hr .... Removed d/t length of note, see older entry if needed... 1122 0833 0.51  Supratherapeutic 1200>1150 un/hr 1122 1834 0.19  Subtherapeutic 1150>1200 un/hr  1123    0224    0.32                Therapeutic X 1  1124    0513    0.17                SUBtherapeutic 1200 > 1300 un/hr 1124 1200 0.24  SUBtherapeutic 1300 > 1350 un/hr 1124    1913   0.30                 Therapeutic x 1 1125    0059    0.21                 SUBtherapeutic @ 1350>1400 un/hr 1125 0755 0.15  SUBtherapeutic 1400 > 1500 un/hr 11/25 1639 0.31  Therapeutic x1 11/25   2329    0.32                Therapeutic X 2   Plan:  11/25:  HL @ 2329 = 0.32, therapeutic X 2 Will continue pt on current rate and recheck HL on 11/26 with AM labs.  -Continue daily CBC while on IV heparin -Follow-up transition to oral anticoagulation  Dennison Mcdaid D, PharmD 03/08/2022 12:09 AM

## 2022-03-08 NOTE — Consult Note (Addendum)
[name]  DOB: 1960/10/09  MRN: [id number]  Date/Time: 03/08/2022 11:54 AM  REQUESTING PROVIDER: Dr.Dgayli Subjective:  REASON FOR CONSULT: Leucocytosis and fever ? [name] is a 61 y.o. with a history of Multiple sclerosis on dalfampridine presented to the ED on 02/24/22 by POV with shortness of breath- she was having rt sided chest pain and shoulder pain and was feeling SOB on lying down. Pt 2 weeks ago tested positive for covid by a home test for resp symptoms. As per family she has had atleast 2 covid vaccine Initial vitals in the ED were  02/24/22 15:35  BP 124/59 !  Temp 99.1 F (37.3 C)  Pulse Rate 140 !  Resp 20   Labs were  Latest Reference Range & Units 02/24/22 15:41  WBC 4.0 - 10.5 K/uL 15.7 (H)  Hemoglobin 12.0 - 15.0 g/dL 9.3 (L)  HCT 84.1 - 66.0 % 29.6 (L)  Platelets 150 - 400 K/uL 231  Creatinine 0.44 - 1.00 mg/dL 6.30   CT CHEST revealed b/l PE with moderate to severe burden- [place] legs showed b/l occlusive thrombus in the peroneal veins On 02/25/22 Pt underwent pulmonary thrombectomy. She was started on unasyn. This was complicated by massive pulmonary hemorrhage needing bronchoscopy to clear  the clots. She has been intubated and is in the ICU since then- She required blood transfusion because of drop in HB on 02/26/22  She needed pressors initially. She had fever on first 2 days and then was fine and fever started again on 11/20 with worsening leucocytosis 03/03/22 had BC, Tracheal aspirate culture neg- repeat CT chest showed b/l GGO and infiltrates and dilated esophagus. She was placed  on zosyn  As wbc is increasing and has low grade fever I am asked to see her PT is off any sedation, no pressors She has baseline tachycardia and was on propanolol until admission and it has been stopped   Past Medical History:  Diagnosis Date   GERD (gastroesophageal reflux disease)    Multiple sclerosis (HCC)     Past Surgical History:   Procedure Laterality Date   ESOPHAGOGASTRODUODENOSCOPY (EGD) WITH PROPOFOL N/A 09/11/2021   Procedure: ESOPHAGOGASTRODUODENOSCOPY (EGD) WITH PROPOFOL;  Surgeon: Jaynie Collins, DO;  Location: [facility] ENDOSCOPY;  Service: Gastroenterology;  Laterality: N/A;   PULMONARY THROMBECTOMY Bilateral 02/25/2022   Procedure: PULMONARY THROMBECTOMY;  Surgeon: Annice Needy, MD;  Location: [facility] INVASIVE CV LAB;  Service: Cardiovascular;  Laterality: Bilateral;   TUBAL LIGATION      Social History   Socioeconomic History   Marital status: Widowed    Spouse name: Not on file   Number of children: Not on file   Years of education: Not on file   Highest education level: Not on file  Occupational History   Not on file  Tobacco Use   Smoking status: Never   Smokeless tobacco: Never  Vaping Use   Vaping Use: Never used  Substance and Sexual Activity   Alcohol use: Yes    Comment: Social   Drug use: Never   Sexual activity: Not Currently  Other Topics Concern   Not on file  Social History Narrative   Not on file   Social Determinants of Health   Financial Resource Strain: Not on file  Food Insecurity: Not on file  Transportation Needs: Not on file  Physical Activity: Not on file  Stress: Not on file  Social Connections: Not on file  Intimate Partner Violence: Not on file    Family  history Son died of Acute leukemia  age  31 yrs in 2020 Mom had pancreatic cancer Allergies  Allergen Reactions   Erythromycin Hives and Nausea And Vomiting   Lexapro [Escitalopram Oxalate] Hives   I? Current Facility-Administered Medications  Medication Dose Route Frequency Provider Last Rate Last Admin   0.9 %  sodium chloride infusion  250 mL Intravenous Continuous Erin Fulling, MD       acetaminophen (TYLENOL) tablet 650 mg  650 mg Per Tube Q6H PRN Rust-Chester, Cecelia Byars, NP   650 mg at 03/08/22 0606   Chlorhexidine Gluconate Cloth 2 % PADS 6 each  6 each Topical Daily Annice Needy, MD   6 each at  03/07/22 2131   cyanocobalamin (VITAMIN B12) tablet 1,000 mcg  1,000 mcg Per Tube Daily Lowella Bandy, RPH   1,000 mcg at 03/08/22 1110   dalfampridine TB12 10 mg  10 mg Oral BID Raechel Chute, MD   10 mg at 03/07/22 0023   famotidine (PEPCID) tablet 20 mg  20 mg Per Tube BID Harlon Ditty D, NP   20 mg at 03/08/22 1110   feeding supplement (PROSource TF20) liquid 60 mL  60 mL Per Tube Daily Chawla, Harsh, MD   60 mL at 03/08/22 1110   feeding supplement (VITAL 1.5 CAL) liquid 1,000 mL  1,000 mL Per Tube Continuous Glorianne Manchester, MD 50 mL/hr at 03/08/22 0804 Infusion Verify at 03/08/22 0804   free water 100 mL  100 mL Per Tube Q6H Jimmye Norman, NP   100 mL at 03/08/22 1200   heparin ADULT infusion 100 units/mL (25000 units/253mL)  1,600 Units/hr Intravenous Continuous Martyn Malay, RPH 16 mL/hr at 03/08/22 0914 1,600 Units/hr at 03/08/22 0914   HYDROmorphone (DILAUDID) injection 1 mg  1 mg Intravenous Q2H PRN Raechel Chute, MD   1 mg at 03/08/22 0019   insulin aspart (novoLOG) injection 0-15 Units  0-15 Units Subcutaneous Q4H Rust-Chester, Britton L, NP   2 Units at 03/08/22 0803   ipratropium-albuterol (DUONEB) 0.5-2.5 (3) MG/3ML nebulizer solution 3 mL  3 mL Nebulization Q6H Harlon Ditty D, NP   3 mL at 03/08/22 0726   mupirocin ointment (BACTROBAN) 2 %   Topical BID Tressie Ellis, [facility]   Given at 03/08/22 0941   norepinephrine (LEVOPHED) 4mg  in (0.016 mg/mL) premix infusion  2-10 mcg/min Intravenous Titrated Rust-Chester, Cecelia Byars, NP   Stopped at 03/04/22 1516   nutrition supplement (JUVEN) (JUVEN) powder packet 1 packet  1 packet Per Tube BID BM Dorene Grebe, Harsh, MD   1 packet at 03/08/22 1110   Oral care mouth rinse  15 mL Mouth Rinse Q2H Chawla, Harsh, MD   15 mL at 03/08/22 1112   Oral care mouth rinse  15 mL Mouth Rinse PRN Chawla, Harsh, MD       piperacillin-tazobactam (ZOSYN) IVPB 3.375 g  3.375 g Intravenous Q8H Beers, Sherlynn Carbon, RPH 12.5 mL/hr at 03/08/22  0804 Infusion Verify at 03/08/22 0804   potassium PHOSPHATE 30 mmol in dextrose 5 % 500 mL infusion  30 mmol Intravenous Once Raechel Chute, MD       pregabalin (LYRICA) capsule 75 mg  75 mg Per Tube Daily Dgayli, Lianne Bushy, MD   75 mg at 03/08/22 1111   propofol (DIPRIVAN) 1000 MG/100ML infusion  5-80 mcg/kg/min Intravenous Titrated Raechel Chute, MD 6.56 mL/hr at 03/08/22 0804 15 mcg/kg/min at 03/08/22 0804     Abtx:  Anti-infectives (From admission, onward)  Start     Dose/Rate Route Frequency Ordered Stop   03/02/22 0915  piperacillin-tazobactam (ZOSYN) IVPB 3.375 g        3.375 g 12.5 mL/hr over 240 Minutes Intravenous Every 8 hours 03/02/22 0828 03/09/22 0559   02/25/22 2030  Ampicillin-Sulbactam (UNASYN) 3 g in sodium chloride 0.9 % 100 mL IVPB  Status:  Discontinued        3 g 200 mL/hr over 30 Minutes Intravenous Every 6 hours 02/25/22 1930 03/01/22 0849   02/25/22 1449  ceFAZolin (ANCEF) 1-4 GM/50ML-% IVPB       Note to Pharmacy: Aretha Parrot : cabinet override      02/25/22 1449 02/26/22 0259   02/25/22 1330  ceFAZolin (ANCEF) IVPB 2g/100 mL premix        2 g 200 mL/hr over 30 Minutes Intravenous 30 min pre-op 02/25/22 1330 02/25/22 1729   02/24/22 1730  vancomycin (VANCOCIN) IVPB 1000 mg/200 mL premix        1,000 mg 200 mL/hr over 60 Minutes Intravenous  Once 02/24/22 1719 02/24/22 2209   02/24/22 1730  cefTRIAXone (ROCEPHIN) 2 g in sodium chloride 0.9 % 100 mL IVPB        2 g 200 mL/hr over 30 Minutes Intravenous  Once 02/24/22 1719 02/24/22 2040   02/24/22 1730  doxycycline (VIBRA-TABS) tablet 100 mg        100 mg Oral  Once 02/24/22 1719 02/24/22 1821       REVIEW OF SYSTEMS:  NA Objective:  VITALS:  BP (!) 145/59   Pulse (!) 145   Temp (!) 100.7 F (38.2 C) (Axillary)   Resp (!) 35   Ht 5\' 2"  (1.575 m)   Wt 71.2 kg   SpO2 99%   BMI 28.71 kg/m   LDA Foley- since 11/23 Previous foley 11/15-11/21 PHYSICAL EXAM:  General:unresponsive  Head:  Normocephalic, without obvious abnormality, atraumatic. Eyes: Conjunctivae clear, anicteric sclerae. Pupils are equal ENT intubated NG tube Neck: Supple, symmetrical, no adenopathy, thyroid: non tender no carotid bruit and no JVD. Lungs: b/l air entry- crepts b/l Heart: Tachycardia Abdomen: Soft, non-tender,not distended. Bowel sounds normal. No masses Extremities: rt foot dorsum- scab Skin: No rashes or lesions. Or bruising Lymph: Cervical, supraclavicular normal. Neurologic:cannot be assessed Pertinent Labs Lab Results  CBC    Component Value Date/Time   WBC 36.3 (H) 03/08/2022 0841   RBC 2.63 (L) 03/08/2022 0841   HGB 7.5 (L) 03/08/2022 0841   HCT 24.5 (L) 03/08/2022 0841   PLT 189 03/08/2022 0841   MCV 93.2 03/08/2022 0841   MCH 28.5 03/08/2022 0841   MCHC 30.6 03/08/2022 0841   RDW 19.9 (H) 03/08/2022 0841   LYMPHSABS 0.9 03/08/2022 0841   MONOABS 7.8 (H) 03/08/2022 0841   EOSABS 0.2 03/08/2022 0841   BASOSABS 0.2 (H) 03/08/2022 0841       Latest Ref Rng & Units 03/08/2022    8:55 AM 03/08/2022    8:34 AM 03/07/2022    4:23 AM  CMP  Glucose 70 - 99 mg/dL  03/09/2022  403   BUN 6 - 20 mg/dL  18  23   Creatinine 474 - 1.00 mg/dL  2.59  5.63   Sodium 8.75 - 145 mmol/L  142  138   Potassium 3.5 - 5.1 mmol/L  3.1  3.8   Chloride 98 - 111 mmol/L  100  95   CO2 22 - 32 mmol/L  34  30   Calcium 8.9 - 10.3 mg/dL  8.5  8.9   Total Protein 6.5 - 8.1 g/dL 5.6     Total Bilirubin 0.3 - 1.2 mg/dL 0.9     Alkaline Phos 38 - 126 U/L 215     AST 15 - 41 U/L 44     ALT 0 - 44 U/L 48         Microbiology: Recent Results (from the past 240 hour(s))  Culture, Respiratory w Gram Stain     Status: None   Collection Time: 03/01/22 11:21 AM   Specimen: Tracheal Aspirate; Respiratory  Result Value Ref Range Status   Specimen Description   Final    TRACHEAL ASPIRATE Performed at [facility], [address] [place], [place] [address]    Special Requests   Final     NONE Performed at [facility], [address] [place], [place] [address]    Gram Stain   Final    FEW WBC PRESENT, PREDOMINANTLY PMN NO ORGANISMS SEEN    Culture   Final    NO GROWTH 2 DAYS Performed at [facility], [address] [place], [place] [address]    Report Status 03/04/2022 FINAL  Final  Culture, blood (Routine X 2) w Reflex to ID Panel     Status: None   Collection Time: 03/03/22 12:41 PM   Specimen: BLOOD LEFT HAND  Result Value Ref Range Status   Specimen Description BLOOD LEFT HAND  Final   Special Requests   Final    BOTTLES DRAWN AEROBIC AND ANAEROBIC Blood Culture adequate volume   Culture   Final    NO GROWTH 5 DAYS Performed at [facility], [address] [place], [place] [address]    Report Status 03/08/2022 FINAL  Final  Culture, blood (Routine X 2) w Reflex to ID Panel     Status: None   Collection Time: 03/03/22 12:46 PM   Specimen: BLOOD  Result Value Ref Range Status   Specimen Description BLOOD RIGHT WRIST  Final   Special Requests   Final    BOTTLES DRAWN AEROBIC AND ANAEROBIC Blood Culture adequate volume   Culture   Final    NO GROWTH 5 DAYS Performed at [facility], [address], [place], [place] [address]    Report Status 03/08/2022 FINAL  Final  Culture, Respiratory w Gram Stain     Status: None   Collection Time: 03/03/22  9:00 PM   Specimen: Tracheal Aspirate; Respiratory  Result Value Ref Range Status   Specimen Description   Final    TRACHEAL ASPIRATE Performed at [facility], [address], [place], [place] [address]    Special Requests   Final    NONE Performed at [facility], [address] [place], [place] [address]    Gram Stain NO WBC SEEN NO ORGANISMS SEEN   Final   Culture   Final    NO GROWTH 2 DAYS Performed at [facility], [address], [place], [place] [address]    Report Status 03/06/2022 FINAL  Final  SARS Coronavirus 2 by RT PCR  (hospital order, performed in [facility] hospital lab) *cepheid single result test* Anterior Nasal Swab     Status: Abnormal   Collection Time: 03/04/22 12:12 AM   Specimen: Anterior Nasal Swab  Result Value Ref Range Status   SARS Coronavirus 2 by RT PCR POSITIVE (A) NEGATIVE Final    Comment: (NOTE) SARS-CoV-2 target nucleic acids are DETECTED  SARS-CoV-2 RNA is generally detectable in upper respiratory specimens  during the acute phase of infection.  Positive results are indicative  of the presence of the identified virus, but do not rule out bacterial infection or co-infection with other pathogens not detected by the test.  Clinical correlation with patient history and  other diagnostic information is necessary to determine patient infection status.  The expected result is negative.  Fact Sheet for Patients:   RoadLapTop.co.za   Fact Sheet for Healthcare Providers:   http://kim-miller.com/    This test is not yet approved or cleared by the [place] FDA and  has been authorized for detection and/or diagnosis of SARS-CoV-2 by FDA under an Emergency Use Authorization (EUA).  This EUA will remain in effect (meaning this test can be used) for the duration of  the COVID-19 declaration under Section 564(b)(1)  of the Act, 21 U.S.C. section 360-bbb-3(b)(1), unless the authorization is terminated or revoked sooner.   Performed at [facility], [address] [place], [place] [address]   C Difficile Quick Screen (NO PCR Reflex)     Status: None   Collection Time: 03/08/22  9:15 AM   Specimen: STOOL  Result Value Ref Range Status   C Diff antigen NEGATIVE NEGATIVE Final   C Diff toxin NEGATIVE NEGATIVE Final   C Diff interpretation No C. difficile detected.  Final    Comment: Performed at [facility], [address] [place], [place] [address]    IMAGING RESULTS: CXR  B/l infiltrates  CT chest  I have  personally reviewed the films ?GGO b/l   Impression/Recommendation ? ?61 yr female with h/o MS wheel chair bound presented with SOB and rt sided chest pain with recent covid infection  Acute PE b/l with mod- severe clot burden with strain Underwent thrombectomy Complicated by severe pulmonary hemorrhage requiring bronchoscopy to clear the clots on 02/25/22 Fever.leucocytosis  was present on 02/25/22 and then improved only to get worse starting 03/02/22 Pt has been on zosyn since 11/20 with no response in  fever or leucocytosis IS the fever due to pulmonary infarct? ?? ARDS ( as per pulmonologist her resp status improving ? Aspiration pneumonitis  ? Untreated infection not covered by zosyn To check the vascular access site for any infection IS the leucocytosis a prolonged effect to Steroids she received until 03/02/22? All cultures are from 11/21 Cdiff neg today UTI unlikely to cause such high leucocytosis and she has been on zosyn - still will get a renal ultrasound to look for any pyonephrosis Pt has baseline tachycardia and was on propanolol which was not continued on admission She is tachycardic and that could be contributing as well Will Dc zosyn Repeat Blood culture, tracheal aspirate culture Depending on labs and her clinical condition may decide on restarting antibiotics with a broader spectrum  Anemia- needed PRBC for pulmonary hemorrhage ? _Remains intubated- passed SBT but has been unresponsive and all sedation has been discontinued to see whether she will wake up  Recent COVID resp illness-   MS  Wheel chair bound  __________________________________________________ Discussed with requesting provider, nurse and her family Note:  This document was prepared using Dragon voice recognition software and may include unintentional dictation errors.

## 2022-03-08 NOTE — Consult Note (Signed)
ANTICOAGULATION CONSULT NOTE  Pharmacy Consult for Heparin Drip Indication: pulmonary embolus  Patient Measurements: Height: 5\' 2"  (157.5 cm) Weight: 71.2 kg (156 lb 15.5 oz) IBW/kg (Calculated) : 50.1 Heparin Dosing Weight: 57.2 kg  Labs: Recent Labs    03/06/22 0513 03/06/22 1200 03/07/22 0423 03/07/22 0755 03/07/22 2329 03/08/22 0628 03/08/22 0834 03/08/22 0841 03/08/22 1506  HGB 8.5*  --  8.3*  --   --  8.5*  --  7.5*  --   HCT 28.1*  --  27.1*  --   --  28.2*  --  24.5*  --   PLT 110*  --  135*  --   --  178  --  189  --   HEPARINUNFRC 0.17*   < >  --    < > 0.32 0.16*  --   --  0.28*  CREATININE 0.32*  --  0.40*  --   --   --  0.34*  --   --    < > = values in this interval not displayed.    Estimated Creatinine Clearance: 70.8 mL/min (A) (by C-G formula based on SCr of 0.34 mg/dL (L)).  Medical History: Past Medical History:  Diagnosis Date   GERD (gastroesophageal reflux disease)    Multiple sclerosis (HCC)   Heparin Dosing Weight: 57.2 kg  Medications:  No home anticoagulation per pharmacist review.  Assessment: 61 yo female presented to ED with SOB when lying flat and some right sided rib and shoulder pain.  Chest CT showed acute PE.  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 Heparin was held in setting of hemoptysis on 11/19 .   Hgb 8.5>8.3, PLT 110>135 --s/p thrombectomy 11/15  Goal of Therapy:  Heparin level 0.3-0.5 units/ml (NO BOLUSES) Monitor platelets by anticoagulation protocol: Yes   Date Time aPTT/HL Rate/Comment 1117  2311  0.21  subtherapeutic; 1150 un/hr .... Removed d/t length of note, see older entry if needed... 1122 0833 0.51  Supratherapeutic 1200>1150 un/hr 1122 1834 0.19  Subtherapeutic 1150>1200 un/hr  1123    0224    0.32                Therapeutic X 1  1124    0513    0.17                SUBtherapeutic 1200 > 1300 un/hr 1124 1200 0.24  SUBtherapeutic 1300 > 1350 un/hr 1124    1913   0.30                 Therapeutic x 1 1125     0059    0.21                SUBtherapeutic @ 1350>1400 un/hr 1125 0755 0.15  SUBtherapeutic 1400 > 1500 un/hr 11/25 1639 0.31  Therapeutic x1 11/25   2329    0.32                Therapeutic X 2  11/26 0628 0.16  Subtherapeutic; 1500>1600 un/hr 11/16 1506 0.28  Subtherapeutic ; 1600 un/hr > 1650 un/hr  Plan:   Will increase rate to 1650 un/hr and recheck HL in 6hrs post rate change.  Continue daily CBC while on IV heparin Follow-up transition to oral anticoagulation  Evelina Lore Rodriguez-Guzman PharmD, BCPS 03/08/2022 7:07 PM

## 2022-03-08 NOTE — Progress Notes (Signed)
PHARMACY CONSULT NOTE  Pharmacy Consult for Electrolyte Monitoring and Replacement   Recent Labs: Potassium (mmol/L)  Date Value  03/07/2022 3.8   Magnesium (mg/dL)  Date Value  25/36/6440 2.2   Calcium (mg/dL)  Date Value  34/74/2595 8.9   Albumin (g/dL)  Date Value  63/87/5643 2.7 (L)   Phosphorus (mg/dL)  Date Value  32/95/1884 3.0   Sodium (mmol/L)  Date Value  03/07/2022 138   Assessment: 61 yo female presented to ED with SOB when lying flat and some right sided rib and shoulder pain.  Chest CT showed acute PE.  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 Pharmacy is asked to follow and replace electrolytes while in CCU.  Nutrition: Tube feeds @50ml /h, FWF 100 q4h, NS KVO  Goal of Therapy:  Electrolytes within normal limits  Plan:  Last dose of Lasix 20mg  IV 1 (11/25); UOP 1.3>1.10ml/k/h K3.8>3.1: Receiving 05-09-1999 from [facility] & KCL IV 11m Q1h x2 doses. Phos 3>1.9: Replace with [facility] IV  x1 All other lytes WNL at present not requiring repletion at this time. Follow-up electrolytes with AM labs tomorrow  03/08/2022 8:43 AM

## 2022-03-08 NOTE — Progress Notes (Signed)
NAME:  [name], MRN:  [id number], DOB:  03-02-1961, LOS: 40 ADMISSION DATE:  02/24/2022, CHIEF COMPLAINT:  hypoxic respiratory failure   History of Present Illness:  61 y.o. female admitted with Acute Hypoxic Respiratory Failure in setting of Bilateral Pulmonary Embolism.  Underwent thrombectomy with Vascular Surgery, post procedure developed Hemoptysis leading to severe blockage of airways requiring emergent intubation. S/p bronch with therapeutic aspiration of blood clots from tracheobronchial tree.  CTA PE: Was read as acute PE with moderate thrombus burden involving both lungs. Bilateral lower extremity venous doppler ultrasounds>>  occlusive thrombus in both peroneal veins.   Pertinent  Medical History  -GERD -MS  Significant Hospital Events: Including procedures, antibiotic start and stop dates in addition to other pertinent events   11/14: Admitted by Hospitalist.  Vascular Surgery consulted 11/15:  Thrombectomy performed, massive amounts of clot removed.  Post procedure developed Acute Hemoptysis requiring emergent intubation and transfer to ICU.  PCCM consulted, plan for emergent Bronchoscopy 11/15 3 hour BRONCH to extract clots 11/16  2 units PRBC's to be given.  Repeat Bronch. 11/17: Off pressors. Increasing FiO2 requirements (60% FiO2, 5 peep), CXR with increase in bilateral opacities concerning for PNA.  NO SBT today. Place OG and start feeds. 11/18: Heparin gtt restarted yesterday with no bolus, no hemoptysis reported and Hgb remains stable.  Slow improvement in FiO2 to 60% (from 75%) with diuresis yesterday, renal function remains normal so will diurese again today 11/19: Still with high vent requirements (60% FiO2, 10 PEEP), Heparin gtt d/c due to bmall volume bright red blood from ETT.  [facility] Vascular workup sent.  Holding diuresis due to increased Na+ & BUN.  Solumedrol increased to 40 mg BID 11/20: Slight improvement in vent requirements (50% FiO2, 8 PEEP).   Worsening Leukocytosis, tracheal aspirate results pending, start Zosyn. 11/21: repeat CT overnight, vomited while in CT. 11/22: no bleeding noted. 11/23: tolerated SBT and sedation holiday - mental status continues to be sluggish 11/24: tolerated SBT and sedation holiday 11/25: tolerated SBT and sedation holiday  Interim History / Subjective:  No acute events overnight.  Objective   Blood pressure (!) 144/65, pulse (!) 145, temperature (!) 100.7 F (38.2 C), temperature source Axillary, resp. rate (!) 32, height 5\' 2"  (1.575 m), weight 71.2 kg, SpO2 96 %.    Vent Mode: PRVC FiO2 (%):  [35 %] 35 % Set Rate:  [16 bmp] 16 bmp Vt Set:  [440 mL] 440 mL PEEP:  [5 cmH20] 5 cmH20 Pressure Support:  [10 cmH20] 10 cmH20 Plateau Pressure:  [15 cmH20] 15 cmH20   Intake/Output Summary (Last 24 hours) at 03/08/2022 0902 Last data filed at 03/08/2022 0804 Gross per 24 hour  Intake 2248.36 ml  Output 3140 ml  Net -891.64 ml    Filed Weights   03/05/22 0500 03/06/22 0500 03/07/22 0500  Weight: 72.9 kg 73.1 kg 71.2 kg    Examination: Physical Exam Vitals reviewed.  Constitutional:      General: She is not in acute distress.    Appearance: She is ill-appearing.  HENT:     Mouth/Throat:     Mouth: Mucous membranes are moist.  Cardiovascular:     Rate and Rhythm: Normal rate and regular rhythm.  Pulmonary:     Comments: Ventilated breath sounds Abdominal:     General: There is no distension.     Palpations: Abdomen is soft.  Musculoskeletal:     Cervical back: Normal range of motion and neck supple.  Neurological:  Mental Status: She is disoriented.     Assessment & Plan:   61 year old female presenting with acute hypoxic respiratory failure secondary to pulmonary embolism complicated by pulmonary hemorrhage following thrombectomy. She is now intubated and mechanically ventilated.   Neurology #Sedation  Switched sedation to propofol and hydromorphone for analgosedation  and she has tolerated this well. Discontinued hydromorphone gtt and in lieu relying on PRN doses. Today she is withdrawing to pain. Continue with sedation holiday. Head CT with chronic findings relating to MS but no hemorrhage or sign of stroke. Given slow resolution of encephalopathy, might require tracheostomy tube.   -daily sedation holiday, will titrate down as tolerated -RASS goal of 0 -Head CT yesterday with no acute findings   Pulmonary #Acute Hypoxic Respiratory Failure #Pulmonary Embolism #Pulmonary Hemorrhage #VAP  Presents with hypoxic respiratory failure and found to have pulmonary emboli for which she underwent thrombectomy by vascular surgery. She developed severe hemoptysis secondary to pulmonary hemorrhage following her procedure requiring intubation and mechanical ventilation. She required multiple bronchs to clear airway secretions and clots, and course further complicated by HAP. [place] workup sent, ANA/ANCA/CCP negative, unclear significance of mildly positive RF. Pulmonary hemorrhage likely spontaneous in the setting of thrombectomy and anticoagulation. No AKI to suggest anti-GBM syndrome. She has tolerated a re-challenge with heparin.   Will continue to wean off the ventilator and assess appropriateness for SBT and vent weaning. She is now on minimal ventilator settings, and has tolerated SBT. Repeat CXR showing improvement, and POCUS today does not show any pleural effusions. I have Low suspicion for vasculitis and discontinued steroids earlier in the week.   Has tolerated SBT's well and is on minimal vent settings, mental status is barrier to extubation. Would likely require a tracheostomy tube next week.  -follow up respiratory cultures -Daily SBT -CXR today  Cardiovascular #Pulmonary Embolus  TTE with normal EF and no RV dysfunction or dilation. So far tolerating heparin gtt. No indication for thrombolysis.   GI Tube feeds, held due to ileus; reinitiated at trickle  and titrated up to goal. Famotidine for prophylaxis   Renal  Kidney function at baseline, continue to monitor.  -gentle diuresis PRN  Hem/Onc  Continue heparin gtt. Monitor CBC bid for bleeding  ID #HAP #Leukocytosis  Rising white count today, increased from 27k to 42k over the past 24 hours. Initially, we were treating HAP given left sided infiltrates on xray and started broad spectrum antibiotics. Cultures have remained negative. Today, POCUS of the lung do not show any effusions to suggest an infected pleural space. Lung [place] with b-lines. COVID was positive but suspect this is a hold over from previous infection. Abdominal CT recently with contrast did not show any abscess or collection. Will repeat LFT's today. Will consult with ID for consideration of C. Diff testing. Consult placed to infectious diseases for workup of leukocytosis and persistent fever.  -continue broad spectrum antibiotics -await culture results -C. Diff testing  Best Practice (right click and "Reselect all SmartList Selections" daily)   Diet/type: tubefeeds DVT prophylaxis: systemic heparin GI prophylaxis: H2B Lines: N/A Foley:  Yes, and it is still needed Code Status:  full code Last date of multidisciplinary goals of care discussion [03/08/2022]  Labs   CBC: Recent Labs  Lab 03/03/22 1232 03/04/22 0123 03/04/22 1834 03/05/22 0224 03/06/22 0513 03/07/22 0423 03/08/22 0628  WBC 32.8*   < > 36.5* 32.9* 27.8* 27.4* 42.5*  NEUTROABS 17.7*  --  22.6*  --   --   --   --  HGB 10.9*   < > 9.7* 9.0* 8.5* 8.3* 8.5*  HCT 35.6*   < > 31.7* 29.5* 28.1* 27.1* 28.2*  MCV 95.2   < > 94.1 95.2 94.9 93.8 94.9  PLT 135*   < > 94* 90* 110* 135* 178   < > = values in this interval not displayed.     Basic Metabolic Panel: Recent Labs  Lab 03/02/22 0647 03/03/22 0406 03/04/22 0123 03/05/22 0224 03/06/22 0513 03/07/22 0423  NA 145 144 139 140 139 138  K 4.8 4.2 4.0 4.3 4.2 3.8  CL 100 99 98 100 97*  95*  CO2 36* 38* 35* 37* 32 30  GLUCOSE 131* 123* 109* 124* 108* 110*  BUN 28* 35* 36* 34* 26* 23*  CREATININE 0.40* 0.47 0.57 0.39* 0.32* 0.40*  CALCIUM 9.3 9.1 8.4* 8.8* 8.6* 8.9  MG  --  2.2 2.4 2.2  --  2.2  PHOS 3.8 3.5 4.6 2.7  --  3.0    GFR: Estimated Creatinine Clearance: 70.8 mL/min (A) (by C-G formula based on SCr of 0.4 mg/dL (L)). Recent Labs  Lab 03/02/22 0647 03/03/22 0406 03/03/22 1916 03/04/22 0123 03/05/22 0224 03/06/22 0513 03/07/22 0423 03/08/22 0628  PROCALCITON 0.68  --   --   --   --   --   --   --   WBC 24.5*   < >  --    < > 32.9* 27.8* 27.4* 42.5*  LATICACIDVEN  --   --  1.4  --   --   --   --   --    < > = values in this interval not displayed.     Liver Function Tests: Recent Labs  Lab 03/02/22 0647 03/03/22 1232  AST  --  36  ALT  --  44  ALKPHOS  --  119  BILITOT  --  1.0  PROT  --  5.4*  ALBUMIN 2.6* 2.7*    No results for input(s): "LIPASE", "AMYLASE" in the last 168 hours. No results for input(s): "AMMONIA" in the last 168 hours.  ABG    Component Value Date/Time   PHART 7.59 (H) 03/07/2022 0848   PCO2ART 43 03/07/2022 0848   PO2ART 87 03/07/2022 0848   HCO3 41.3 (H) 03/07/2022 0848   ACIDBASEDEF 4.0 (H) 02/26/2022 0307   O2SAT 98.6 03/07/2022 0848     Coagulation Profile: No results for input(s): "INR", "PROTIME" in the last 168 hours.   Cardiac Enzymes: No results for input(s): "CKTOTAL", "CKMB", "CKMBINDEX", "TROPONINI" in the last 168 hours.  HbA1C: No results found for: "HGBA1C"  CBG: Recent Labs  Lab 03/07/22 1556 03/07/22 1943 03/07/22 2331 03/08/22 0402 03/08/22 0741  GLUCAP 156* 148* 123* 83 129*     Review of Systems:   Unable to obtain  Past Medical History:  She,  has a past medical history of GERD (gastroesophageal reflux disease) and Multiple sclerosis (HCC).   Surgical History:   Past Surgical History:  Procedure Laterality Date   ESOPHAGOGASTRODUODENOSCOPY (EGD) WITH PROPOFOL N/A  09/11/2021   Procedure: ESOPHAGOGASTRODUODENOSCOPY (EGD) WITH PROPOFOL;  Surgeon: Jaynie Collins, DO;  Location: [facility] ENDOSCOPY;  Service: Gastroenterology;  Laterality: N/A;   PULMONARY THROMBECTOMY Bilateral 02/25/2022   Procedure: PULMONARY THROMBECTOMY;  Surgeon: Annice Needy, MD;  Location: [facility] INVASIVE CV LAB;  Service: Cardiovascular;  Laterality: Bilateral;   TUBAL LIGATION       Social History:   reports that she has never smoked. She has never used  smokeless tobacco. She reports current alcohol use. She reports that she does not use drugs.   Family History:  Her family history is not on file.   Allergies Allergies  Allergen Reactions   Erythromycin Hives and Nausea And Vomiting   Lexapro [Escitalopram Oxalate] Hives     Home Medications  Prior to Admission medications   Medication Sig Start Date End Date Taking? Authorizing Provider  cyanocobalamin 1000 MCG tablet Take by mouth.   Yes [provider]  DULoxetine (CYMBALTA) 30 MG capsule Take 30 mg by mouth daily. 06/25/21  Yes [provider]  gabapentin (NEURONTIN) 400 MG capsule Take Gabapentin 400 mg in the morning, 400 mg in the afternoon, and 800 mg at night 12/29/21  Yes [provider]  omeprazole (PRILOSEC) 20 MG capsule Take 20 mg by mouth daily. 06/10/21  Yes [provider]  propranolol (INDERAL) 20 MG tablet Take 1 tablet by mouth 2 (two) times daily. 12/16/21  Yes [provider]  rOPINIRole (REQUIP) 0.25 MG tablet Take 0.25 mg at night for 1 week, then increase to 0.5 mg at night 05/29/21  Yes [provider]  tolterodine (DETROL LA) 2 MG 24 hr capsule Take 2 mg by mouth 2 (two) times daily. 12/29/21 12/29/22 Yes [provider]  ascorbic acid (VITAMIN C) 500 MG tablet Take by mouth.    [provider]  Cholecalciferol 50 MCG (2000 UT) CAPS Take by mouth.    [provider]  cyclobenzaprine (FLEXERIL) 10 MG tablet Take 10 mg by mouth  2 (two) times daily as needed. 11/26/21   [provider]  dalfampridine 10 MG TB12 Take 1 tablet by mouth every 12 (twelve) hours. 12/29/21   [provider]  meloxicam (MOBIC) 15 MG tablet Take 1 tablet (15 mg total) by mouth daily. 08/23/21 08/23/22  Cuthriell, Charline Bills, PA-C  methylPREDNISolone (MEDROL DOSEPAK) 4 MG TBPK tablet Take by mouth. 11/26/21   [provider]  naproxen (NAPROSYN) 500 MG tablet Take by mouth.    [provider]  nitrofurantoin, macrocrystal-monohydrate, (MACROBID) 100 MG capsule Take 1 capsule (100 mg total) by mouth 2 (two) times daily. 01/19/22   Immordino, Annie Main, FNP  pregabalin (LYRICA) 75 MG capsule Take 75 mg by mouth 3 (three) times daily. 08/18/21   [provider]  traMADol (ULTRAM) 50 MG tablet Take 50 mg by mouth 3 (three) times daily as needed. 12/26/21   [provider]     Critical care time: 64 minutes

## 2022-03-08 NOTE — Consult Note (Signed)
ANTICOAGULATION CONSULT NOTE  Pharmacy Consult for Heparin Drip Indication: pulmonary embolus  Patient Measurements: Height: 5\' 2"  (157.5 cm) Weight: 71.2 kg (156 lb 15.5 oz) IBW/kg (Calculated) : 50.1 Heparin Dosing Weight: 57.2 kg  Labs: Recent Labs    03/06/22 0513 03/06/22 1200 03/07/22 0423 03/07/22 0755 03/07/22 1639 03/07/22 2329 03/08/22 0628  HGB 8.5*  --  8.3*  --   --   --  8.5*  HCT 28.1*  --  27.1*  --   --   --  28.2*  PLT 110*  --  135*  --   --   --  178  HEPARINUNFRC 0.17*   < >  --    < > 0.31 0.32 0.16*  CREATININE 0.32*  --  0.40*  --   --   --   --    < > = values in this interval not displayed.    Estimated Creatinine Clearance: 70.8 mL/min (A) (by C-G formula based on SCr of 0.4 mg/dL (L)).  Medical History: Past Medical History:  Diagnosis Date   GERD (gastroesophageal reflux disease)    Multiple sclerosis (HCC)   Heparin Dosing Weight: 57.2 kg  Medications:  No home anticoagulation per pharmacist review.  Assessment: 61 yo female presented to ED with SOB when lying flat and some right sided rib and shoulder pain.  Chest CT showed acute PE.  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 Heparin was held in setting of hemoptysis on 11/19 .   Hgb 8.5>8.3, PLT 110>135 --s/p thrombectomy 11/15  Goal of Therapy:  Heparin level 0.3-0.5 units/ml (NO BOLUSES) Monitor platelets by anticoagulation protocol: Yes   Date Time aPTT/HL Rate/Comment 1117  2311  0.21  subtherapeutic; 1150 un/hr .... Removed d/t length of note, see older entry if needed... 1122 0833 0.51  Supratherapeutic 1200>1150 un/hr 1122 1834 0.19  Subtherapeutic 1150>1200 un/hr  1123    0224    0.32                Therapeutic X 1  1124    0513    0.17                SUBtherapeutic 1200 > 1300 un/hr 1124 1200 0.24  SUBtherapeutic 1300 > 1350 un/hr 1124    1913   0.30                 Therapeutic x 1 1125    0059    0.21                SUBtherapeutic @ 1350>1400  un/hr 1125 0755 0.15  SUBtherapeutic 1400 > 1500 un/hr 11/25 1639 0.31  Therapeutic x1 11/25   2329    0.32                Therapeutic X 2  11/26 0628 0.16  Subtherapeutic; 1500>1600 un/hr  Plan:  11/26:  HL @ 0628 = 0.16,SUBtherapeutic Will increase rate to 1600 un/hr and recheck HL in 6hrs post rate change.  Continue daily CBC while on IV heparin Follow-up transition to oral anticoagulation  12/26, PharmD 03/08/2022 8:41 AM

## 2022-03-09 ENCOUNTER — Inpatient Hospital Stay: Payer: Medicare Other

## 2022-03-09 DIAGNOSIS — J9601 Acute respiratory failure with hypoxia: Secondary | ICD-10-CM | POA: Diagnosis not present

## 2022-03-09 DIAGNOSIS — R4182 Altered mental status, unspecified: Secondary | ICD-10-CM | POA: Diagnosis not present

## 2022-03-09 DIAGNOSIS — J189 Pneumonia, unspecified organism: Secondary | ICD-10-CM | POA: Diagnosis not present

## 2022-03-09 DIAGNOSIS — D72829 Elevated white blood cell count, unspecified: Secondary | ICD-10-CM

## 2022-03-09 DIAGNOSIS — Y95 Nosocomial condition: Secondary | ICD-10-CM

## 2022-03-09 DIAGNOSIS — I2699 Other pulmonary embolism without acute cor pulmonale: Secondary | ICD-10-CM | POA: Diagnosis not present

## 2022-03-09 DIAGNOSIS — R0489 Hemorrhage from other sites in respiratory passages: Secondary | ICD-10-CM | POA: Diagnosis not present

## 2022-03-09 LAB — CBC WITH DIFFERENTIAL/PLATELET
Abs Immature Granulocytes: 4.52 10*3/uL — ABNORMAL HIGH (ref 0.00–0.07)
Basophils Absolute: 0.2 10*3/uL — ABNORMAL HIGH (ref 0.0–0.1)
Basophils Relative: 1 %
Eosinophils Absolute: 0 10*3/uL (ref 0.0–0.5)
Eosinophils Relative: 0 %
HCT: 26 % — ABNORMAL LOW (ref 36.0–46.0)
Hemoglobin: 7.7 g/dL — ABNORMAL LOW (ref 12.0–15.0)
Immature Granulocytes: 12 %
Lymphocytes Relative: 2 %
Lymphs Abs: 0.8 10*3/uL (ref 0.7–4.0)
MCH: 28.3 pg (ref 26.0–34.0)
MCHC: 29.6 g/dL — ABNORMAL LOW (ref 30.0–36.0)
MCV: 95.6 fL (ref 80.0–100.0)
Monocytes Absolute: 6.2 10*3/uL — ABNORMAL HIGH (ref 0.1–1.0)
Monocytes Relative: 16 %
Neutro Abs: 26.3 10*3/uL — ABNORMAL HIGH (ref 1.7–7.7)
Neutrophils Relative %: 69 %
Platelets: 308 10*3/uL (ref 150–400)
RBC: 2.72 MIL/uL — ABNORMAL LOW (ref 3.87–5.11)
RDW: 20.1 % — ABNORMAL HIGH (ref 11.5–15.5)
Smear Review: NORMAL
WBC: 38.1 10*3/uL — ABNORMAL HIGH (ref 4.0–10.5)
nRBC: 0.6 % — ABNORMAL HIGH (ref 0.0–0.2)

## 2022-03-09 LAB — RESPIRATORY PANEL BY PCR

## 2022-03-09 LAB — BASIC METABOLIC PANEL
Anion gap: 9 (ref 5–15)
BUN: 24 mg/dL — ABNORMAL HIGH (ref 6–20)
CO2: 29 mmol/L (ref 22–32)
Calcium: 8.7 mg/dL — ABNORMAL LOW (ref 8.9–10.3)
Chloride: 103 mmol/L (ref 98–111)
Creatinine, Ser: 0.4 mg/dL — ABNORMAL LOW (ref 0.44–1.00)
GFR, Estimated: >60 mL/min (ref >60)
Glucose, Bld: 132 mg/dL — ABNORMAL HIGH (ref 70–99)
Potassium: 3.4 mmol/L — ABNORMAL LOW (ref 3.5–5.1)
Sodium: 141 mmol/L (ref 135–145)

## 2022-03-09 LAB — GLUCOSE, CAPILLARY
Glucose-Capillary: 113 mg/dL — ABNORMAL HIGH (ref 70–99)
Glucose-Capillary: 116 mg/dL — ABNORMAL HIGH (ref 70–99)
Glucose-Capillary: 123 mg/dL — ABNORMAL HIGH (ref 70–99)
Glucose-Capillary: 132 mg/dL — ABNORMAL HIGH (ref 70–99)
Glucose-Capillary: 134 mg/dL — ABNORMAL HIGH (ref 70–99)
Glucose-Capillary: 141 mg/dL — ABNORMAL HIGH (ref 70–99)
Glucose-Capillary: 90 mg/dL (ref 70–99)

## 2022-03-09 LAB — CBC
HCT: 24.3 % — ABNORMAL LOW (ref 36.0–46.0)
Hemoglobin: 7.4 g/dL — ABNORMAL LOW (ref 12.0–15.0)
MCH: 28.6 pg (ref 26.0–34.0)
MCHC: 30.5 g/dL (ref 30.0–36.0)
MCV: 93.8 fL (ref 80.0–100.0)
Platelets: 233 10*3/uL (ref 150–400)
RBC: 2.59 MIL/uL — ABNORMAL LOW (ref 3.87–5.11)
RDW: 20.1 % — ABNORMAL HIGH (ref 11.5–15.5)
WBC: 37.6 10*3/uL — ABNORMAL HIGH (ref 4.0–10.5)
nRBC: 0.7 % — ABNORMAL HIGH (ref 0.0–0.2)

## 2022-03-09 LAB — HEPARIN LEVEL (UNFRACTIONATED)
Heparin Unfractionated: 0.33 IU/mL (ref 0.30–0.70)
Heparin Unfractionated: 0.38 IU/mL (ref 0.30–0.70)

## 2022-03-09 LAB — CORTISOL-AM, BLOOD: Cortisol - AM: 27.8 ug/dL — ABNORMAL HIGH (ref 6.7–22.6)

## 2022-03-09 LAB — TECHNOLOGIST SMEAR REVIEW: Plt Morphology: NORMAL

## 2022-03-09 LAB — PHOSPHORUS: Phosphorus: 2.2 mg/dL — ABNORMAL LOW (ref 2.5–4.6)

## 2022-03-09 LAB — TSH: TSH: 0.907 u[IU]/mL (ref 0.350–4.500)

## 2022-03-09 LAB — TRIGLYCERIDES: Triglycerides: 243 mg/dL — ABNORMAL HIGH (ref <150)

## 2022-03-09 LAB — MAGNESIUM: Magnesium: 2.2 mg/dL (ref 1.7–2.4)

## 2022-03-09 MED ORDER — POTASSIUM & SODIUM PHOSPHATES 280-160-250 MG PO PACK
2.0000 | PACK | Freq: Once | ORAL | Status: AC
  Administered 2022-03-09: 2
  Filled 2022-03-09: qty 2

## 2022-03-09 MED ORDER — POTASSIUM CHLORIDE 20 MEQ PO PACK
40.0000 meq | PACK | Freq: Once | ORAL | Status: AC
  Administered 2022-03-09: 40 meq
  Filled 2022-03-09: qty 2

## 2022-03-09 MED ORDER — GADOBUTROL 1 MMOL/ML IV SOLN
7.0000 mL | Freq: Once | INTRAVENOUS | Status: AC | PRN
  Administered 2022-03-09: 7 mL via INTRAVENOUS

## 2022-03-09 NOTE — Progress Notes (Signed)
NAME:  [name], MRN:  [id number], DOB:  [birth year], LOS: 106 ADMISSION DATE:  02/24/2022, CHIEF COMPLAINT:  hypoxic respiratory failure   History of Present Illness:  61 y.o. female admitted with Acute Hypoxic Respiratory Failure in setting of Bilateral Pulmonary Embolism.  Underwent thrombectomy with Vascular Surgery, post procedure developed Hemoptysis leading to severe blockage of airways requiring emergent intubation. S/p bronch with therapeutic aspiration of blood clots from tracheobronchial tree.  CTA PE: Was read as acute PE with moderate thrombus burden involving both lungs. Bilateral lower extremity venous doppler ultrasounds>>  occlusive thrombus in both peroneal veins.   Pertinent  Medical History  -GERD -MS  Significant Hospital Events: Including procedures, antibiotic start and stop dates in addition to other pertinent events   11/14: Admitted by Hospitalist.  Vascular Surgery consulted 11/15:  Thrombectomy performed, massive amounts of clot removed.  Post procedure developed Acute Hemoptysis requiring emergent intubation and transfer to ICU.  PCCM consulted, plan for emergent Bronchoscopy 11/15 3 hour BRONCH to extract clots 11/16  2 units PRBC's to be given.  Repeat Bronch. 11/17: Off pressors. Increasing FiO2 requirements (60% FiO2, 5 peep), CXR with increase in bilateral opacities concerning for PNA.  NO SBT today. Place OG and start feeds. 11/18: Heparin gtt restarted yesterday with no bolus, no hemoptysis reported and Hgb remains stable.  Slow improvement in FiO2 to 60% (from 75%) with diuresis yesterday, renal function remains normal so will diurese again today 11/19: Still with high vent requirements (60% FiO2, 10 PEEP), Heparin gtt d/c due to bmall volume bright red blood from ETT.  Collagen Vascular workup sent.  Holding diuresis due to increased Na+ & BUN.  Solumedrol increased to 40 mg BID 11/20: Slight improvement in vent requirements (50% FiO2, 8 PEEP).   Worsening Leukocytosis, tracheal aspirate results pending, start Zosyn. 11/21: repeat CT overnight, vomited while in CT. 11/22: no bleeding noted. 11/23: tolerated SBT and sedation holiday - mental status continues to be sluggish 11/24: tolerated SBT and sedation holiday 11/25: tolerated SBT and sedation holiday  Interim History / Subjective:  Remains intubated Remains on vent Failure to wean from vent Resp muscle fatigue +fevers +PE   Objective   Blood pressure (!) 150/68, pulse (!) 132, temperature (!) 101.7 F (38.7 C), temperature source Axillary, resp. rate (!) 34, height 5\' 2"  (1.575 m), weight 71.2 kg, SpO2 94 %.    Vent Mode: PRVC FiO2 (%):  [35 %] 35 % Set Rate:  [16 bmp] 16 bmp Vt Set:  [440 mL] 440 mL PEEP:  [5 cmH20] 5 cmH20 Pressure Support:  [10 cmH20] 10 cmH20 Plateau Pressure:  [20 cmH20] 20 cmH20   Intake/Output Summary (Last 24 hours) at 03/09/2022 7124 Last data filed at 03/09/2022 0530 Gross per 24 hour  Intake 1777.01 ml  Output 2850 ml  Net -1072.99 ml    Filed Weights   03/05/22 0500 03/06/22 0500 03/07/22 0500  Weight: 72.9 kg 73.1 kg 71.2 kg     REVIEW OF SYSTEMS  PATIENT IS UNABLE TO PROVIDE COMPLETE REVIEW OF SYSTEMS DUE TO SEVERE CRITICAL ILLNESS   PHYSICAL EXAMINATION:  GENERAL:critically ill appearing, +resp distress EYES: Pupils equal, round, reactive to light.  No scleral icterus.  MOUTH: Moist mucosal membrane. INTUBATED NECK: Supple.  PULMONARY: Lungs clear to auscultation, +rhonchi, +wheezing CARDIOVASCULAR: S1 and S2.  Regular rate and rhythm GASTROINTESTINAL: Soft, nontender, -distended. Positive bowel sounds.  MUSCULOSKELETAL: No swelling, clubbing, or edema.  NEUROLOGIC: obtunded,sedated SKIN:normal, warm to touch, Capillary refill delayed  Pulses present bilaterally   Assessment & Plan:   61 year old female presenting with acute hypoxic respiratory failure secondary to pulmonary embolism complicated by pulmonary  hemorrhage following thrombectomy. She is now intubated and mechanically ventilated. Failure to wean from vent after 13 days    Severe ACUTE Hypoxic and Hypercapnic Respiratory Failure -continue Mechanical Ventilator support -Wean Fio2 and PEEP as tolerated -VAP/VENT bundle implementation - Wean PEEP & FiO2 as tolerated, maintain SpO2 > 88% - Head of bed elevated 30 degrees, VAP protocol in place - Plateau pressures less than 30 cm H20  - Intermittent chest x-ray & ABG PRN - Ensure adequate pulmonary hygiene  Will need to discuss plan of care with family Has tolerated SBT's well and is on minimal vent settings, mental status is barrier to extubation. Would likely require a tracheostomy tube   NEUROLOGY ACUTE METABOLIC ENCEPHALOPATHY Wean sedation and assess NEURO status -daily sedation holiday, will titrate down as tolerated -RASS goal of 0 -Head CT yesterday with no acute findings    Acute Hypoxic Respiratory Failure/Pulmonary Embolism/Pulmonary Hemorrhage/VAP Presents with hypoxic respiratory failure and found to have pulmonary emboli for which she underwent thrombectomy by vascular surgery. She developed severe hemoptysis secondary to pulmonary hemorrhage following her procedure requiring intubation and mechanical ventilation. She required multiple bronchs to clear airway secretions and clots, and course further complicated by HAP. [place] workup sent, ANA/ANCA/CCP negative, unclear significance of mildly positive RF. Pulmonary hemorrhage likely spontaneous in the setting of thrombectomy and anticoagulation. No AKI to suggest anti-GBM syndrome. She has tolerated a re-challenge with heparin.    INFECTIOUS DISEASE -continue antibiotics as prescribed -follow up cultures -follow up ID consultation Rising white count today, increased from 27k to 42k over the past 24 hours. Initially, we were treating HAP given left sided infiltrates on xray and started broad spectrum antibiotics. Cultures  have remained negative. Today, POCUS of the lung do not show any effusions to suggest an infected pleural space. Lung [place] with b-lines. COVID was positive but suspect this is a hold over from previous infection. Abdominal CT recently with contrast did not show any abscess or collection. Will repeat LFT's today. Will consult with ID for consideration of C. Diff testing. Consult placed to infectious diseases for workup of leukocytosis and persistent fever.    ELECTROLYTES -follow labs as needed -replace as needed -pharmacy consultation and following   ENDO - ICU hypoglycemic\Hyperglycemia protocol -check FSBS per protocol   GI GI PROPHYLAXIS as indicated  NUTRITIONAL STATUS DIET-->TF's as tolerated Constipation protocol as indicated   Best Practice (right click and "Reselect all SmartList Selections" daily)   Diet/type: tubefeeds DVT prophylaxis: systemic heparin GI prophylaxis: H2B Lines: N/A Foley:  Yes, and it is still needed Code Status:  full code   Labs   CBC: Recent Labs  Lab 03/03/22 1232 03/04/22 0123 03/04/22 1834 03/05/22 0224 03/07/22 0423 03/08/22 0539 03/08/22 0841 03/08/22 2105 03/09/22 0220  WBC 32.8*   < > 36.5*   < > 27.4* 42.5* 36.3* 39.5* 37.6*  NEUTROABS 17.7*  --  22.6*  --   --   --  23.9* 25.9*  --   HGB 10.9*   < > 9.7*   < > 8.3* 8.5* 7.5* 7.8* 7.4*  HCT 35.6*   < > 31.7*   < > 27.1* 28.2* 24.5* 25.4* 24.3*  MCV 95.2   < > 94.1   < > 93.8 94.9 93.2 93.7 93.8  PLT 135*   < > 94*   < >  135* 178 189 241 233   < > = values in this interval not displayed.     Basic Metabolic Panel: Recent Labs  Lab 03/04/22 0123 03/05/22 0224 03/06/22 0513 03/07/22 0423 03/08/22 0834 03/09/22 0220  NA 139 140 139 138 142 141  K 4.0 4.3 4.2 3.8 3.1* 3.4*  CL 98 100 97* 95* 100 103  CO2 35* 37* 32 30 34* 29  GLUCOSE 109* 124* 108* 110* 129* 132*  BUN 36* 34* 26* 23* 18 24*  CREATININE 0.57 0.39* 0.32* 0.40* 0.34* 0.40*  CALCIUM 8.4* 8.8* 8.6* 8.9  8.5* 8.7*  MG 2.4 2.2  --  2.2 2.2 2.2  PHOS 4.6 2.7  --  3.0 1.9* 2.2*    GFR: Estimated Creatinine Clearance: 70.8 mL/min (A) (by C-G formula based on SCr of 0.4 mg/dL (L)). Recent Labs  Lab 03/03/22 1916 03/04/22 0123 03/08/22 0628 03/08/22 0841 03/08/22 0855 03/08/22 2105 03/09/22 0220  PROCALCITON  --   --   --   --  1.45  --   --   WBC  --    < > 42.5* 36.3*  --  39.5* 37.6*  LATICACIDVEN 1.4  --   --   --   --   --   --    < > = values in this interval not displayed.     Liver Function Tests: Recent Labs  Lab 03/03/22 1232 03/08/22 0855  AST 36 44*  ALT 44 48*  ALKPHOS 119 215*  BILITOT 1.0 0.9  PROT 5.4* 5.6*  ALBUMIN 2.7* 2.4*    ABG    Component Value Date/Time   PHART 7.59 (H) 03/07/2022 0848   PCO2ART 43 03/07/2022 0848   PO2ART 87 03/07/2022 0848   HCO3 41.3 (H) 03/07/2022 0848   ACIDBASEDEF 4.0 (H) 02/26/2022 0307   O2SAT 98.6 03/07/2022 0848      DVT/GI PRX  assessed I Assessed the need for Labs I Assessed the need for Foley I Assessed the need for Central Venous Line Family Discussion when available I Assessed the need for Mobilization I made an Assessment of medications to be adjusted accordingly Safety Risk assessment completed  CASE DISCUSSED IN MULTIDISCIPLINARY ROUNDS WITH ICU TEAM     Critical Care Time devoted to patient care services described in this note is 55 minutes.  Critical care was necessary to treat /prevent imminent and life-threatening deterioration. Overall, patient is critically ill, prognosis is guarded.  Patient with Multiorgan failure and at high risk for cardiac arrest and death.    Corrin Parker, M.D.  [facility]  Medical Director [place] Director [facility] Cardio-Pulmonary Department

## 2022-03-09 NOTE — Progress Notes (Signed)
Eeg done 

## 2022-03-09 NOTE — IPAL (Signed)
  Interdisciplinary Goals of Care Family Meeting   Date carried out: 03/09/2022  Location of the meeting: Unit  Member's involved: Physician, Nurse Practitioner, and Family Member or next of kin  Durable Power of Attorney or acting medical decision maker: Lorna Few, sister    Discussion: We discussed goals of care for [name] who is here with bilateral pulmonary emboli s/p thrombectomy and multiple bronchoscopy procedures. She is also still severely encephalopathic and is unable to wean from the ventilator. MRI imaging of the brain today showed chronic cerebral and cerebellar micro-hemorrhages. Family would like to continue having goals of care discussions, but would like to change CODE STATUS today to DNR.  Code status: Full DNR  Disposition: Continue current acute care  Time spent for the meeting: 15 minutes    Cecelia Byars Rust-Chester, NP  03/09/2022, 7:12 PM  Cheryll Cockayne Rust-Chester, AGACNP-BC Acute Care Nurse Practitioner [facility]   [phone] / [phone] Please see Amion for pager details.

## 2022-03-09 NOTE — Procedures (Signed)
Patient Name: [name]  MRN: [id number]  Epilepsy Attending: Charlsie Quest  Referring Physician/Provider: Judithe Modest, NP  Date: 03/09/2022 Duration: 27.49 mins  Patient history: 61 year old female with altered mental status.  EEG to evaluate for seizure.  Level of alertness: Awake, asleep  AEDs during EEG study: None  Technical aspects: This EEG study was done with scalp electrodes positioned according to the 10-20 International system of electrode placement. Electrical activity was reviewed with band pass filter of 1-70Hz , sensitivity of 7 uV/mm, display speed of 67mm/sec with a 60Hz  notched filter applied as appropriate. EEG data were recorded continuously and digitally stored.  Video monitoring was available and reviewed as appropriate.  Description: The posterior dominant rhythm consists of 8-9 Hz activity of moderate voltage (25-35 uV) seen predominantly in posterior head regions, symmetric and reactive to eye opening and eye closing. Sleep was characterized by vertex waves, sleep spindles (12 to 14 Hz), maximal frontocentral region. EEG showed intermittent generalized 3 to 6 Hz theta-delta slowing. Hyperventilation and photic stimulation were not performed.     ABNORMALITY - Intermittent slow, generalized  IMPRESSION: This study is suggestive of mild diffuse encephalopathy, nonspecific etiology. No seizures or epileptiform discharges were seen throughout the recording.  [name] 

## 2022-03-09 NOTE — Progress Notes (Addendum)
Date of Admission:  02/24/2022      ID: [name] is a 61 y.o. female  Principal Problem:   Bilateral pulmonary embolism (HCC) Active Problems:   Multiple sclerosis (HCC)   Chronic constipation   Gastroesophageal reflux disease   RLS (restless legs syndrome)   Tachycardia   Sepsis ([place])   Acute hypoxic respiratory failure (HCC)   HAP (hospital-acquired pneumonia)   Pulmonary hemorrhage  ? [name] is a 61 y.o. with a history of Multiple sclerosis on dalfampridine presented to the ED on 02/24/22 by POV with shortness of breath- she was having rt sided chest pain and shoulder pain and was feeling SOB on lying down. Pt 2 weeks ago tested positive for covid by a home test for resp symptoms. As per family she has had atleast 2 covid vaccine CT CHEST revealed b/l PE with moderate to severe burden- [place] legs showed b/l occlusive thrombus in the peroneal veins On 02/25/22 Pt underwent pulmonary thrombectomy. She was started on unasyn. This was complicated by massive pulmonary hemorrhage needing bronchoscopy to clear  the clots. She has been intubated and is in the ICU since then- She required blood transfusion because of drop in HB on 02/26/22  She needed pressors initially. She had fever on first 2 days and then was fine and fever started again on 11/20 with worsening leucocytosis 03/03/22 had BC, Tracheal aspirate culture neg- repeat CT chest showed b/l GGO and infiltrates and dilated esophagus. She was placed  on zosyn  As wbc iwas increasing and  fever I saw her on 03/08/22 PT is off  sedation, no pressors She has baseline tachycardia and was on propanolol until admission and it has been stopped  Subjective: Status quo intubated  Medications:   Chlorhexidine Gluconate Cloth  6 each Topical Daily   cyanocobalamin  1,000 mcg Per Tube Daily   dalfampridine  10 mg Oral BID   feeding supplement (PROSource TF20)  60 mL Per Tube Daily   free water  100 mL Per  Tube Q6H   insulin aspart  0-15 Units Subcutaneous Q4H   ipratropium-albuterol  3 mL Nebulization Q6H   mupirocin ointment   Topical BID   nutrition supplement (JUVEN)  1 packet Per Tube BID BM   mouth rinse  15 mL Mouth Rinse Q2H   pantoprazole (PROTONIX) IV  40 mg Intravenous Q24H   potassium & sodium phosphates  2 packet Per Tube Once   potassium chloride  40 mEq Per Tube Once   pregabalin  75 mg Per Tube Daily   propranolol  20 mg Per Tube BID    Objective: Vital signs in last 24 hours: Patient Vitals for the past 24 hrs:  BP Temp Temp src Pulse Resp SpO2  03/09/22 0937 -- -- -- -- -- 95 %  03/09/22 0803 -- 99.7 F (37.6 C) -- -- -- 97 %  03/09/22 0800 136/73 99.7 F (37.6 C) Oral (!) 128 (!) 26 95 %  03/09/22 0500 (!) 150/68 (!) 101.7 F (38.7 C) Axillary (!) 132 (!) 34 94 %  03/09/22 0400 (!) 150/77 (!) 101 F (38.3 C) Oral (!) 130 (!) 32 91 %  03/09/22 0300 (!) 141/71 -- -- -- (!) 31 --  03/09/22 0200 133/67 -- -- (!) 125 (!) 39 93 %  03/09/22 0100 134/68 -- -- (!) 126 (!) 36 93 %  03/09/22 0000 (!) 128/55 (!) 101.4 F (38.6 C) Oral (!) 121 (!) 37 97 %  03/08/22 2300 (!)  137/58 -- -- (!) 124 (!) 41 99 %  03/08/22 2200 (!) 148/71 -- -- (!) 129 (!) 39 95 %  03/08/22 2147 (!) 146/64 -- -- (!) 161 -- --  03/08/22 2100 (!) 146/64 -- -- (!) 159 (!) 36 97 %  03/08/22 2000 (!) 151/68 (!) 102.7 F (39.3 C) Oral (!) 157 (!) 39 95 %  03/08/22 1900 (!) 150/63 -- -- (!) 153 (!) 34 96 %  03/08/22 1800 (!) 154/66 -- -- (!) 152 (!) 31 97 %  03/08/22 1700 (!) 144/66 -- -- (!) 148 (!) 30 96 %  03/08/22 1609 -- (!) 101 F (38.3 C) Axillary (!) 153 (!) 37 95 %  03/08/22 1600 (!) 153/69 -- -- (!) 152 (!) 38 95 %  03/08/22 1500 (!) 143/68 -- -- -- (!) 38 --  03/08/22 1451 -- -- -- -- -- 92 %  03/08/22 1400 (!) 151/58 -- -- -- (!) 38 --  03/08/22 1300 (!) 151/59 -- -- (!) 148 (!) 33 96 %  03/08/22 1244 -- (!) 100.6 F (38.1 C) Axillary -- (!) 32 --  03/08/22 1200 (!) 140/67 -- -- --  (!) 31 --  03/08/22 1117 -- -- -- -- -- 99 %  03/08/22 1100 (!) 145/59 -- -- -- (!) 35 --      PHYSICAL EXAM:  General: intubated , unresponsive Lungs:b/l air entry- crepts Heart:tachycardia Abdomen: Soft, non-tender,not distended. Bowel sounds normal. No masses Extremities: rt foot- -small wound dorsum Skin: No rashes or lesions. Or bruising Lymph: Cervical, supraclavicular normal. Neurologic: cannot assess  Lab Results Recent Labs    03/08/22 0834 03/08/22 0841 03/08/22 2105 03/09/22 0220  WBC  --    < > 39.5* 37.6*  HGB  --    < > 7.8* 7.4*  HCT  --    < > 25.4* 24.3*  NA 142  --   --  141  K 3.1*  --   --  3.4*  CL 100  --   --  103  CO2 34*  --   --  29  BUN 18  --   --  24*  CREATININE 0.34*  --   --  0.40*   < > = values in this interval not displayed.   Liver Panel Recent Labs    03/08/22 0855  PROT 5.6*  ALBUMIN 2.4*  AST 44*  ALT 48*  ALKPHOS 215*  BILITOT 0.9  BILIDIR 0.4*  IBILI 0.5    Microbiology: 03/03/22 BC - NG Sputum NG  Studies/Results: US RENAL  Result Date: 03/08/2022 CLINICAL DATA:  Fever EXAM: RENAL / URINARY TRACT ULTRASOUND COMPLETE COMPARISON:  None Available. FINDINGS: Right Kidney: Renal measurements: 9.9 x 4.5 x 5.3 cm = volume: 121.4 mL. Echogenicity within normal limits. No mass or hydronephrosis visualized. Left Kidney: Renal measurements: 10.1 x 5.2 x 5.6 cm = volume: 153.1 mL. Echogenicity within normal limits. No mass or hydronephrosis visualized. Bladder: Urinary bladder is collapsed about the Foley's catheter. Other: None. IMPRESSION: 1. No hydronephrosis. 2. Urinary bladder is collapsed about the Foley's catheter. Electronically Signed   By: Keane Police D.O.   On: 03/08/2022 21:33   DG Chest Port 1 View  Result Date: 03/08/2022 CLINICAL DATA:  Chronic dyspnea.  History of bilateral PEs. EXAM: PORTABLE CHEST 1 VIEW COMPARISON:  03/06/2022 FINDINGS: Tracheostomy tube tip is stable above the carina. There is an enteric  tube with tip and side port below the GE junction. Stable cardiomediastinal contours. Bilateral interstitial  and airspace opacities are again noted, not significantly improved from previous exam. IMPRESSION: 1. No significant change in aeration to the lungs compared with previous exam. 2. Stable support apparatus. Electronically Signed   By: Kerby Moors M.D.   On: 03/08/2022 09:05   CT HEAD WO CONTRAST (5MM)  Result Date: 03/07/2022 CLINICAL DATA:  Altered mental status EXAM: CT HEAD WITHOUT CONTRAST TECHNIQUE: Contiguous axial images were obtained from the base of the skull through the vertex without intravenous contrast. RADIATION DOSE REDUCTION: This exam was performed according to the departmental dose-optimization program which includes automated exposure control, adjustment of the mA and/or kV according to patient size and/or use of iterative reconstruction technique. COMPARISON:  Brain MRI 09/23/2021 FINDINGS: Brain: There is no acute intracranial hemorrhage, extra-axial fluid collection, or acute infarct. Parenchymal volume is normal. The ventricles are normal in size. Gray-white differentiation is preserved. Hypodense lesions in the supratentorial white matter consistent with the history of multiple sclerosis are noted but better seen on prior MRI. There is no mass lesion.  There is no mass effect or midline shift. Vascular: No hyperdense vessel or unexpected calcification. Skull: Normal. Negative for fracture or focal lesion. Sinuses/Orbits: There is opacification of the imaged portion of the right maxillary sinus with a small amount of layering fluid in the imaged left maxillary sinus. There is a dysconjugate gaze. The globes and orbits are otherwise unremarkable. Other: There is a left mastoid effusion. IMPRESSION: 1. No acute intracranial pathology. 2. Dysconjugate gaze. 3. Hypodense lesions in the supratentorial white matter consistent with multiple sclerosis, better seen on prior MRI. 4.  Paranasal sinus disease with layering fluid in the imaged left maxillary sinus which could reflect acute sinusitis in the correct clinical setting. 5. Left mastoid effusion. Electronically Signed   By: Valetta Mole M.D.   On: 03/07/2022 18:54     Assessment/Plan:  ?61 yr female with h/o MS wheel chair bound presented with SOB and rt sided chest pain with recent covid infection   Acute PE b/l with mod- severe clot burden with strain Underwent thrombectomy Complicated by severe pulmonary hemorrhage requiring bronchoscopy to clear the clots on 02/25/22 Fever.leucocytosis  was present on 02/25/22 and then improved only to get worse starting 03/02/22 Pt has been on zosyn since 11/20 with no response in  fever or leucocytosis, so was Dc 03/08/22 and started on meropenem and linezolid- new cultures sent IS the fever due to pulmonary infarct? ?? ARDS ( as per pulmonologist her resp status improving ? Aspiration pneumonitis  ? Untreated infection not covered by zosyn IS the leucocytosis a prolonged effect to Steroids she received until 03/02/22?  rule out acute myelomonocytic leukemia- peripheral smear shows blasts 1-2%, monocytosis- heme onc consult, bone marrow biopsy( as per her sister in law patient's son died of acute leukemia in 06-09-18 age 39 yrs) All cultures are from 11/21 Cdiff neg today UTI unlikely to cause such high leucocytosis and she has been on zosyn - renal ultrasound N Pt has baseline tachycardia and was on propanolol which was not continued on admission Repeated culture yesterday and started broad spectrum antibiotics- if the repeat culture comes back neg will stop antibiotics as they have not really helped her   Anemia- needed PRBC for pulmonary hemorrhage ? _Remains intubated- passed SBT but has been unresponsive and all sedation has been discontinued to see whether she will wake up   Recent COVID resp illness-     MS  Wheel chair bound  Discussed the management  with care  team

## 2022-03-09 NOTE — Progress Notes (Signed)
Discussed this case with Dr. Belia Heman and Harlon Ditty from the CCM team.  Patient with a history of MS on Ocrevus as the disease modifying therapy and also on adjunctive dalfampridine for walking with gradual decline in her ambulation, prior to presentation using motorized scooter for ambulation, had COVID and then admitted with large PE that required thrombectomy.  Remains difficult to wean off of the ventilator. At this point, I would suspect that poor brain reserve is contributing towards slower recovery but given the fact that she had DVT PE and COVID, stroke should be ruled out and an MRI of the brain with and without contrast should be done to rule out  both strokes as well as any evidence of MS exacerbation and also any evidence of hypoxic/anoxic brain injury that she might have occurred. I also agree with the EEG-which have reviewed the result of, looks nonfocal. I will be available to discuss as needed but I suspect that she will have a prolonged course of recovery in terms of her neurological status given poor baseline with multiple sclerosis.   -- Milon Dikes, MD Neurologist [facility] Pager: [phone]

## 2022-03-09 NOTE — Progress Notes (Signed)
PHARMACY CONSULT NOTE  Pharmacy Consult for Electrolyte Monitoring and Replacement   Recent Labs: Potassium (mmol/L)  Date Value  03/09/2022 3.4 (L)   Magnesium (mg/dL)  Date Value  38/01/1750 2.2   Calcium (mg/dL)  Date Value  02/58/5277 8.7 (L)   Albumin (g/dL)  Date Value  82/42/3536 2.4 (L)   Phosphorus (mg/dL)  Date Value  14/43/1540 2.2 (L)   Sodium (mmol/L)  Date Value  03/09/2022 141   Assessment: 61 yo female presented to ED with SOB when lying flat and some right sided rib and shoulder pain.  Chest CT showed acute PE.  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 Pharmacy is asked to follow and replace electrolytes while in CCU.  Nutrition: Tube feeds at 26ml/h, FWF 100 q4h  Goal of Therapy:  Electrolytes within normal limits  Plan:  --K 3.4, Kcl 40 mEq per tube x 1 --Phos 2.2, Phos-Nak 2 packets per tube x 1 --Follow-up electrolytes with AM labs tomorrow  Tressie Ellis 03/09/2022 9:00 AM

## 2022-03-09 NOTE — Consult Note (Signed)
ANTICOAGULATION CONSULT NOTE  Pharmacy Consult for Heparin Drip Indication: pulmonary embolus  Patient Measurements: Height: 5\' 2"  (157.5 cm) Weight: 71.2 kg (156 lb 15.5 oz) IBW/kg (Calculated) : 50.1 Heparin Dosing Weight: 57.2 kg  Labs: Recent Labs    03/07/22 0423 03/07/22 0755 03/08/22 0628 03/08/22 0834 03/08/22 0841 03/08/22 1506 03/08/22 2105 03/09/22 0220  HGB 8.3*  --  8.5*  --  7.5*  --  7.8* 7.4*  HCT 27.1*  --  28.2*  --  24.5*  --  25.4* 24.3*  PLT 135*  --  178  --  189  --  241 233  HEPARINUNFRC  --    < > 0.16*  --   --  0.28*  --  0.33  CREATININE 0.40*  --   --  0.34*  --   --   --  0.40*   < > = values in this interval not displayed.    Estimated Creatinine Clearance: 70.8 mL/min (A) (by C-G formula based on SCr of 0.4 mg/dL (L)).  Medical History: Past Medical History:  Diagnosis Date   GERD (gastroesophageal reflux disease)    Multiple sclerosis (HCC)   Heparin Dosing Weight: 57.2 kg  Medications:  No home anticoagulation per pharmacist review.  Assessment: 61 yo female presented to ED with SOB when lying flat and some right sided rib and shoulder pain. Chest CT showed acute PE.  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 Status post thrombectomy 11/15. Heparin was held in setting of hemoptysis on 11/19.   Goal of Therapy:  Heparin level 0.3-0.5 units/ml (NO BOLUSES) Monitor platelets by anticoagulation protocol: Yes   Date Time aPTT/HL Rate/Comment 1117  2311  0.21  subtherapeutic; 1150 un/hr .... Removed d/t length of note, see older entry if needed... 1122 0833 0.51  Supratherapeutic 1200>1150 un/hr 1122 1834 0.19  Subtherapeutic 1150>1200 un/hr  1123    0224    0.32                Therapeutic x 1  1124    0513    0.17                SUBtherapeutic 1200 > 1300 un/hr 1124 1200 0.24  SUBtherapeutic 1300 > 1350 un/hr 1124    1913   0.30                 Therapeutic x 1 1125    0059    0.21                SUBtherapeutic @ 1350>1400  un/hr 1125 0755 0.15  SUBtherapeutic 1400 > 1500 un/hr 11/25 1639 0.31  Therapeutic x1 11/25   2329    0.32                Therapeutic x 2  11/26 0628 0.16  Subtherapeutic; 1500>1600 un/hr 11/26 1506 0.28  Subtherapeutic ; 1600 un/hr > 1650 un/hr 11/27   0220    0.33                Therapeutic x 1  11/27 0740 0.38  Therapeutic x 2  Plan:  --Heparin level is therapeutic x 2 --Continue heparin infusion at 1650 units/hr --Next HL check tomorrow AM along with CBC  12/27 03/09/2022 7:57 AM

## 2022-03-09 NOTE — Consult Note (Signed)
ANTICOAGULATION CONSULT NOTE  Pharmacy Consult for Heparin Drip Indication: pulmonary embolus  Patient Measurements: Height: 5\' 2"  (157.5 cm) Weight: 71.2 kg (156 lb 15.5 oz) IBW/kg (Calculated) : 50.1 Heparin Dosing Weight: 57.2 kg  Labs: Recent Labs    03/06/22 0513 03/06/22 1200 03/07/22 0423 03/07/22 0755 03/08/22 0628 03/08/22 0834 03/08/22 0841 03/08/22 1506 03/08/22 2105 03/09/22 0220  HGB 8.5*  --  8.3*  --  8.5*  --  7.5*  --  7.8* 7.4*  HCT 28.1*  --  27.1*  --  28.2*  --  24.5*  --  25.4* 24.3*  PLT 110*  --  135*  --  178  --  189  --  241 233  HEPARINUNFRC 0.17*   < >  --    < > 0.16*  --   --  0.28*  --  0.33  CREATININE 0.32*  --  0.40*  --   --  0.34*  --   --   --   --    < > = values in this interval not displayed.    Estimated Creatinine Clearance: 70.8 mL/min (A) (by C-G formula based on SCr of 0.34 mg/dL (L)).  Medical History: Past Medical History:  Diagnosis Date   GERD (gastroesophageal reflux disease)    Multiple sclerosis (HCC)   Heparin Dosing Weight: 57.2 kg  Medications:  No home anticoagulation per pharmacist review.  Assessment: 61 yo female presented to ED with SOB when lying flat and some right sided rib and shoulder pain.  Chest CT showed acute PE.  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 Heparin was held in setting of hemoptysis on 11/19 .   Hgb 8.5>8.3, PLT 110>135 --s/p thrombectomy 11/15  Goal of Therapy:  Heparin level 0.3-0.5 units/ml (NO BOLUSES) Monitor platelets by anticoagulation protocol: Yes   Date Time aPTT/HL Rate/Comment 1117  2311  0.21  subtherapeutic; 1150 un/hr .... Removed d/t length of note, see older entry if needed... 1122 0833 0.51  Supratherapeutic 1200>1150 un/hr 1122 1834 0.19  Subtherapeutic 1150>1200 un/hr  1123    0224    0.32                Therapeutic X 1  1124    0513    0.17                SUBtherapeutic 1200 > 1300 un/hr 1124 1200 0.24  SUBtherapeutic 1300 > 1350 un/hr 1124    1913    0.30                 Therapeutic x 1 1125    0059    0.21                SUBtherapeutic @ 1350>1400 un/hr 1125 0755 0.15  SUBtherapeutic 1400 > 1500 un/hr 11/25 1639 0.31  Therapeutic x1 11/25   2329    0.32                Therapeutic X 2  11/26 0628 0.16  Subtherapeutic; 1500>1600 un/hr 11/26 1506 0.28  Subtherapeutic ; 1600 un/hr > 1650 un/hr 11/27   0220    0.33                Therapeutic X 1   Plan:  11/27:  HL @0220  = 0.33, therapeutic X 1 Will continue pt on current rate and recheck HL on 11/27 @ 0800.   Angeligue Bowne D 03/09/2022 3:03 AM

## 2022-03-10 DIAGNOSIS — J9601 Acute respiratory failure with hypoxia: Secondary | ICD-10-CM | POA: Diagnosis not present

## 2022-03-10 DIAGNOSIS — G928 Other toxic encephalopathy: Secondary | ICD-10-CM | POA: Diagnosis not present

## 2022-03-10 DIAGNOSIS — R0489 Hemorrhage from other sites in respiratory passages: Secondary | ICD-10-CM | POA: Diagnosis not present

## 2022-03-10 DIAGNOSIS — D72829 Elevated white blood cell count, unspecified: Secondary | ICD-10-CM | POA: Diagnosis not present

## 2022-03-10 DIAGNOSIS — I2699 Other pulmonary embolism without acute cor pulmonale: Secondary | ICD-10-CM | POA: Diagnosis not present

## 2022-03-10 LAB — MAGNESIUM: Magnesium: 2.2 mg/dL (ref 1.7–2.4)

## 2022-03-10 LAB — BASIC METABOLIC PANEL
Anion gap: 6 (ref 5–15)
BUN: 25 mg/dL — ABNORMAL HIGH (ref 6–20)
CO2: 27 mmol/L (ref 22–32)
Calcium: 8.6 mg/dL — ABNORMAL LOW (ref 8.9–10.3)
Chloride: 109 mmol/L (ref 98–111)
Creatinine, Ser: 0.35 mg/dL — ABNORMAL LOW (ref 0.44–1.00)
GFR, Estimated: >60 mL/min (ref >60)
Glucose, Bld: 144 mg/dL — ABNORMAL HIGH (ref 70–99)
Potassium: 3.6 mmol/L (ref 3.5–5.1)
Sodium: 142 mmol/L (ref 135–145)

## 2022-03-10 LAB — CBC
HCT: 24.8 % — ABNORMAL LOW (ref 36.0–46.0)
Hemoglobin: 7.4 g/dL — ABNORMAL LOW (ref 12.0–15.0)
MCH: 28.4 pg (ref 26.0–34.0)
MCHC: 29.8 g/dL — ABNORMAL LOW (ref 30.0–36.0)
MCV: 95 fL (ref 80.0–100.0)
Platelets: 261 10*3/uL (ref 150–400)
RBC: 2.61 MIL/uL — ABNORMAL LOW (ref 3.87–5.11)
RDW: 20.1 % — ABNORMAL HIGH (ref 11.5–15.5)
WBC: 28.3 10*3/uL — ABNORMAL HIGH (ref 4.0–10.5)
nRBC: 0.8 % — ABNORMAL HIGH (ref 0.0–0.2)

## 2022-03-10 LAB — GLUCOSE, CAPILLARY
Glucose-Capillary: 133 mg/dL — ABNORMAL HIGH (ref 70–99)
Glucose-Capillary: 125 mg/dL — ABNORMAL HIGH (ref 70–99)
Glucose-Capillary: 118 mg/dL — ABNORMAL HIGH (ref 70–99)
Glucose-Capillary: 126 mg/dL — ABNORMAL HIGH (ref 70–99)
Glucose-Capillary: 130 mg/dL — ABNORMAL HIGH (ref 70–99)

## 2022-03-10 LAB — HEPARIN LEVEL (UNFRACTIONATED): Heparin Unfractionated: <0.1 IU/mL — ABNORMAL LOW (ref 0.30–0.70)

## 2022-03-10 LAB — PHOSPHORUS: Phosphorus: 2.7 mg/dL (ref 2.5–4.6)

## 2022-03-10 NOTE — Progress Notes (Signed)
ID Remains intubated But has been more responsive today Patient Vitals for the past 24 hrs:  BP Temp Temp src Pulse Resp SpO2  03/10/22 2100 139/67 -- -- -- (!) 28 --  03/10/22 2000 132/61 99.3 F (37.4 C) Axillary -- (!) 22 96 %  03/10/22 1923 -- -- -- -- -- 96 %  03/10/22 1900 (!) 141/66 -- -- -- (!) 28 --  03/10/22 1800 137/73 -- -- (!) 128 (!) 28 96 %  03/10/22 1700 136/68 -- -- (!) 128 (!) 28 96 %  03/10/22 1600 130/62 (!) 100.7 F (38.2 C) Oral (!) 124 (!) 24 96 %  03/10/22 1505 -- -- -- -- -- 96 %  03/10/22 1500 138/68 -- -- (!) 127 (!) 28 96 %  03/10/22 1400 121/65 (!) 100.7 F (38.2 C) Oral (!) 120 (!) 28 91 %  03/10/22 1300 129/69 -- -- (!) 113 (!) 25 96 %  03/10/22 1200 136/61 (!) 100.5 F (38.1 C) Oral (!) 117 (!) 29 96 %  03/10/22 1132 -- -- -- -- -- 96 %  03/10/22 1100 (!) 127/59 (!) 100.5 F (38.1 C) Oral -- (!) 28 96 %  03/10/22 1000 (!) 119/55 -- -- (!) 101 (!) 23 96 %  03/10/22 0900 (!) 115/53 -- -- (!) 110 (!) 24 96 %  03/10/22 0825 -- -- -- -- -- 96 %  03/10/22 0800 126/68 (!) 101 F (38.3 C) Axillary (!) 129 (!) 29 94 %  03/10/22 0600 (!) 126/58 -- -- (!) 124 (!) 28 96 %  03/10/22 0545 -- (!) 101.6 F (38.7 C) Axillary -- -- --  03/10/22 0500 133/64 -- -- (!) 127 (!) 32 99 %  03/10/22 0400 (!) 136/59 (!) 101.5 F (38.6 C) Oral (!) 124 (!) 31 99 %  03/10/22 0300 (!) 127/57 -- -- (!) 119 (!) 33 100 %  03/10/22 0200 (!) 117/58 -- -- (!) 117 (!) 34 100 %  03/10/22 0100 (!) 114/50 -- -- (!) 111 (!) 31 100 %  03/10/22 0000 (!) 117/59 (!) 100.9 F (38.3 C) Oral (!) 112 (!) 36 99 %  03/09/22 2300 (!) 110/56 (!) 101 F (38.3 C) Oral -- (!) 22 --  03/09/22 2200 137/62 -- -- (!) 142 (!) 32 --    Chest b/l air entry- crepts Tachycardia  Cns cannot be assessed  Labs    Latest Ref Rng & Units 03/10/2022    6:21 AM 03/09/2022    7:43 PM 03/09/2022    2:20 AM  CBC  WBC 4.0 - 10.5 K/uL 28.3  38.1  37.6   Hemoglobin 12.0 - 15.0 g/dL 7.4  7.7  7.4    Hematocrit 36.0 - 46.0 % 24.8  26.0  24.3   Platelets 150 - 400 K/uL 261  308  233        Latest Ref Rng & Units 03/10/2022    6:21 AM 03/09/2022    2:20 AM 03/08/2022    8:55 AM  CMP  Glucose 70 - 99 mg/dL 144  132    BUN 6 - 20 mg/dL 25  24    Creatinine 0.44 - 1.00 mg/dL 0.35  0.40    Sodium 135 - 145 mmol/L 142  141    Potassium 3.5 - 5.1 mmol/L 3.6  3.4    Chloride 98 - 111 mmol/L 109  103    CO2 22 - 32 mmol/L 27  29    Calcium 8.9 - 10.3 mg/dL 8.6  8.7  Total Protein 6.5 - 8.1 g/dL   5.6   Total Bilirubin 0.3 - 1.2 mg/dL   0.9   Alkaline Phos 38 - 126 U/L   215   AST 15 - 41 U/L   44   ALT 0 - 44 U/L   48     [place] 11/21 BC / tracheal culture NG 11/26 tracheal culture neg 11/27 BC - NG Cdiff neg   Impression/recommendation  61 yr female with h/o MS wheel chair bound presented with SOB and rt sided chest pain with recent covid infection   Acute PE b/l with mod- severe clot burden with strain Underwent thrombectomy Complicated by severe pulmonary hemorrhage requiring bronchoscopy to clear the clots on 02/25/22 On going fever and leucocytosis- has not responded to antibiotics- currently on meropenem and linezolid- leucocytosis has started to decline IS the fever due to pulmonary infarct? ?? ARDS ( as per pulmonologist her resp status improving ? Aspiration pneumonitis   rule out acute myelomonocytic leukemia causing fever - peripheral smear shows blasts 1-2%, monocytosis- heme onc consult, bone marrow biopsy( a patient's son died of acute leukemia in 06/16/18 age 4 yrs)  UTI unlikely to cause such high leucocytosis and she has been on zosyn - renal ultrasound N   IF fever does not resolve and leucocytosis persist will have to investigate for AMML Discussed the management with patient's family and intensivist

## 2022-03-10 NOTE — Progress Notes (Signed)
Patient switched back to rate and volume due to increased WOB. NP aware. Will continue to monitor.

## 2022-03-10 NOTE — Consult Note (Signed)
Neurology Consultation  Reason for Consult: Difficult to arouse after holding sedation, history of MS Referring Physician: Dr. Belia Heman  CC: AMS  History is obtained from: Chart  HPI: [name] is a 61 y.o. female past medical history of MS on ocrelizumab infusions as an outpatient, GERD, admitted after presenting to the hospital with shortness of breath and found to have bilateral pulmonary emboli and was taken for thrombectomy by vascular surgery, remains in the ICU intubated and neurological consultations obtained because patient is having a difficult time waking up after sedation has been held now for 4 to 5 days.  She had a history of COVID-19 infection a few weeks prior to presentation. She has had a very complex clinical course, which I will briefly summarize-admitted on 14 November for bilateral PE with vascular surgical consultation, thrombectomy performed with massive amount of clot removal on the 15th and postprocedure developed acute hemoptysis requiring emergent intubation and transfer to the ICU, with bronchoscopy performed with extraction of clots.  She required 2 units of blood transfusion and repeat bronchoscopy.  She had increasing FiO2 requirements.  She was anticoagulated for the PE with heparin drip.  Remained difficult to oxygenate with high oxygen and PEEP requirements with concern for aspiration for which antibiotics were started.  Her mental status still continues to be very sluggish and she was not responsive for which neurological consultation was obtained. I had recommended obtaining an EEG and an MRI of the brain.  EEG revealed generalized slowing without any evidence of epileptiform activity.  MRI of the brain was more interesting because it showed chronic microhemorrhages in the bilateral cerebelli as well as bilateral cerebral hemispheres which were new compared to a few months ago. Clinically she has started to improve some today according to the care team.   Family member not at bedside-attempted to call twice with no answer at this time.  ROS: Unable to perform due to mentation Past Medical History:  Diagnosis Date   GERD (gastroesophageal reflux disease)    Multiple sclerosis (HCC)    History reviewed. No pertinent family history.  Social History:   reports that she has never smoked. She has never used smokeless tobacco. She reports current alcohol use. She reports that she does not use drugs.  Medications  Current Facility-Administered Medications:    0.9 %  sodium chloride infusion, 250 mL, Intravenous, Continuous, Erin Fulling, MD, Stopped at 03/09/22 0212   acetaminophen (TYLENOL) tablet 650 mg, 650 mg, Per Tube, Q6H PRN, Rust-Chester, Britton L, NP, 650 mg at 03/10/22 0545   Chlorhexidine Gluconate Cloth 2 % PADS 6 each, 6 each, Topical, Daily, Dew, Marlow Baars, MD, 6 each at 03/09/22 2315   cyanocobalamin (VITAMIN B12) tablet 1,000 mcg, 1,000 mcg, Per Tube, Daily, Lowella Bandy, RPH, 1,000 mcg at 03/10/22 0811   feeding supplement (PROSource TF20) liquid 60 mL, 60 mL, Per Tube, Daily, Chawla, Harsh, MD, 60 mL at 03/10/22 0811   feeding supplement (VITAL 1.5 CAL) liquid 1,000 mL, 1,000 mL, Per Tube, Continuous, Chawla, Harsh, MD, Last Rate: 50 mL/hr at 03/10/22 1200, Infusion Verify at 03/10/22 1200   free water 100 mL, 100 mL, Per Tube, Q6H, Ouma, Hubbard Hartshorn, NP, 100 mL at 03/10/22 1104   HYDROmorphone (DILAUDID) injection 1 mg, 1 mg, Intravenous, Q2H PRN, Dgayli, Khabib, MD, 1 mg at 03/08/22 0019   insulin aspart (novoLOG) injection 0-15 Units, 0-15 Units, Subcutaneous, Q4H, Rust-Chester, Britton L, NP, 2 Units at 03/10/22 0810   ipratropium-albuterol (DUONEB) 0.5-2.5 (3)  MG/3ML nebulizer solution 3 mL, 3 mL, Nebulization, Q6H, Harlon Ditty D, NP, 3 mL at 03/10/22 0801   linezolid (ZYVOX) IVPB 600 mg, 600 mg, Intravenous, Q12H, Lynn Ito, MD, Stopped at 03/10/22 0923   meropenem (MERREM) 1 g in sodium chloride 0.9  % 100 mL IVPB, 1 g, Intravenous, Q8H, Ravishankar, Rhodia Albright, MD, Stopped at 03/10/22 0558   mupirocin ointment (BACTROBAN) 2 %, , Topical, BID, Tressie Ellis, [facility], Given at 03/10/22 0355   nutrition supplement (JUVEN) (JUVEN) powder packet 1 packet, 1 packet, Per Tube, BID BM, Chawla, Harsh, MD, 1 packet at 03/10/22 0812   Oral care mouth rinse, 15 mL, Mouth Rinse, Q2H, Chawla, Harsh, MD, 15 mL at 03/10/22 1104   Oral care mouth rinse, 15 mL, Mouth Rinse, PRN, Chawla, Harsh, MD   pantoprazole (PROTONIX) injection 40 mg, 40 mg, Intravenous, Q24H, Dgayli, Khabib, MD, 40 mg at 03/10/22 0811   propranolol (INDERAL) tablet 20 mg, 20 mg, Per Tube, BID, Jimmye Norman, NP, 20 mg at 03/10/22 9741  Exam: Current vital signs: BP 136/61 (BP Location: Left Arm)   Pulse (!) 117   Temp (!) 100.5 F (38.1 C) (Oral)   Resp (!) 29   Ht 5\' 2"  (1.575 m)   Wt 71.2 kg   SpO2 96%   BMI 28.71 kg/m  Vital signs in last 24 hours: Temp:  [100.3 F (37.9 C)-101.7 F (38.7 C)] 100.5 F (38.1 C) (11/28 1200) Pulse Rate:  [101-142] 117 (11/28 1200) Resp:  [22-36] 29 (11/28 1200) BP: (110-153)/(50-72) 136/61 (11/28 1200) SpO2:  [92 %-100 %] 96 % (11/28 1200) FiO2 (%):  [35 %] 35 % (11/28 1132) General: On no sedation, intubated HEENT: Normocephalic atraumatic CVS: Regular rhythm Respiratory: Vented Abdomen nondistended nontender Neurological exam Intubated, no sedation for the past 4 to 5 days To voice, opens eyes. Is able to open and close her eyes to command. Unable to give me a thumbs up or move fingers to command. Is able to wiggle the right toe mildly to command.  Not able to wiggle the left toes. Cranial nerves: Pupils are equal round reactive to light, gaze is midline, she does not track me, blinks to threat from both sides, facial symmetry difficult to ascertain due to the tube. Motor examination: No movement in any of the extremities except flicker movement to command in the right  toes, only once of the 2 or 3 times that I tried. Sensory exam: Grimaces to minimal noxious stimulation very appropriately but no withdrawal.  Labs I have reviewed labs in epic and the results pertinent to this consultation are: CBC    Component Value Date/Time   WBC 28.3 (H) 03/10/2022 0621   RBC 2.61 (L) 03/10/2022 0621   HGB 7.4 (L) 03/10/2022 0621   HCT 24.8 (L) 03/10/2022 0621   PLT 261 03/10/2022 0621   MCV 95.0 03/10/2022 0621   MCH 28.4 03/10/2022 0621   MCHC 29.8 (L) 03/10/2022 0621   RDW 20.1 (H) 03/10/2022 0621   LYMPHSABS 0.8 03/09/2022 1943   MONOABS 6.2 (H) 03/09/2022 1943   EOSABS 0.0 03/09/2022 1943   BASOSABS 0.2 (H) 03/09/2022 1943    CMP     Component Value Date/Time   NA 142 03/10/2022 0621   K 3.6 03/10/2022 0621   CL 109 03/10/2022 0621   CO2 27 03/10/2022 0621   GLUCOSE 144 (H) 03/10/2022 0621   BUN 25 (H) 03/10/2022 0621   CREATININE 0.35 (L) 03/10/2022 0621   CALCIUM  8.6 (L) 03/10/2022 0621   PROT 5.6 (L) 03/08/2022 0855   ALBUMIN 2.4 (L) 03/08/2022 0855   AST 44 (H) 03/08/2022 0855   ALT 48 (H) 03/08/2022 0855   ALKPHOS 215 (H) 03/08/2022 0855   BILITOT 0.9 03/08/2022 0855   GFRNONAA >60 03/10/2022 6045    Imaging I have reviewed the images obtained: MR brain with no acute stroke or abnormal enhancement.  Interestingly there are numerous foci of microhemorrhages in bilateral cerebellum and bilateral cerebral hemispheres which are new from the prior scan from few months ago.  There is no evidence of acute bleeding.  Assessment:  61 year old woman past history of MS on ocrelizumab admitted a few weeks after having COVID with shortness of breath and found to have large bilateral PEs requiring thrombectomy and emergent intubation after she started having hemoptysis which required bronchoscopy.  She has been intubated and has been difficult to wean.  Her mentation was also poor and has only very recently in the last day or so started to improve  some. Her brain imaging does not reveal any evidence of an acute stroke or MS exacerbation. Her brain MRI does reveal numerous chronic microhemorrhages which are new from the scan from few months ago. I would not be surprised if she had some sort of a systemic coagulopathic phenomenon that has resulted in these microbleeds or if it is some sort of post-COVID vasculitic kind of phenomenon as has been suggested by radiology. That said, there is definitely a prolonged time before returning to baseline in her case and I suspect that poor brain reserve in the setting of an underlying neurodegenerative process, multiple sclerosis, is likely responsible for her being slow to respond, in the setting of underlying systemic illness.  Impression: Toxic metabolic encephalopathy from underlying systemic issues such as pulmonary emboli and pneumonia, post-COVID encephalopathy. History of multiple sclerosis with no imaging evidence of acute exacerbation  Recommendations: At this time I would recommend continuing supportive care and management per primary team as you are. I think that she will show gradual improvement with time. She might require prolonged support with ventilation and tracheostomy/PEG tube for short duration of time might not be unreasonable. I tried to call the H POA over the phone twice but with no response.  I will attempt again and have this discussion with her. I have discussed this plan with Dr. Belia Heman and Harlon Ditty from the CCM team on the unit. Inpatient neurology service will be available with questions as needed.  Please do not hesitate to contact us.   -- Milon Dikes, MD Neurologist [facility] Pager: [phone]  CRITICAL CARE ATTESTATION Performed by: Milon Dikes, MD Total critical care time: 40 minutes Critical care time was exclusive of separately billable procedures and treating other patients and/or supervising APPs/Residents/Students Critical care was  necessary to treat or prevent imminent or life-threatening deterioration. This patient is critically ill and at significant risk for neurological worsening and/or death and care requires constant monitoring. Critical care was time spent personally by me on the following activities: development of treatment plan with patient and/or surrogate as well as nursing, discussions with consultants, evaluation of patient's response to treatment, examination of patient, obtaining history from patient or surrogate, ordering and performing treatments and interventions, ordering and review of laboratory studies, ordering and review of radiographic studies, pulse oximetry, re-evaluation of patient's condition, participation in multidisciplinary rounds and medical decision making of high complexity in the care of this patient.

## 2022-03-10 NOTE — Progress Notes (Signed)
PHARMACY CONSULT NOTE  Pharmacy Consult for Electrolyte Monitoring and Replacement   Recent Labs: Potassium (mmol/L)  Date Value  03/10/2022 3.6   Magnesium (mg/dL)  Date Value  13/11/6576 2.2   Calcium (mg/dL)  Date Value  46/96/2952 8.6 (L)   Albumin (g/dL)  Date Value  84/13/2440 2.4 (L)   Phosphorus (mg/dL)  Date Value  02/07/2535 2.7   Sodium (mmol/L)  Date Value  03/10/2022 142   Assessment: 61 yo female presented to ED with SOB when lying flat and some right sided rib and shoulder pain.  Chest CT showed acute PE.  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 Pharmacy is asked to follow and replace electrolytes while in CCU.  Nutrition: Tube feeds at 33ml/h, FWF 100 q4h  Goal of Therapy:  Electrolytes within normal limits  Plan:  --No electrolyte replacement indicated at this time --Follow-up electrolytes with AM labs tomorrow  Tressie Ellis 03/10/2022 11:31 AM

## 2022-03-10 NOTE — Progress Notes (Addendum)
NAME:  [name], MRN:  [id number], DOB:  12/22/1960, LOS: 14 ADMISSION DATE:  02/24/2022, CHIEF COMPLAINT:  hypoxic respiratory failure   History of Present Illness:  61 y.o. female admitted with Acute Hypoxic Respiratory Failure in setting of Bilateral Pulmonary Embolism.  Underwent thrombectomy with Vascular Surgery, post procedure developed Hemoptysis leading to severe blockage of airways requiring emergent intubation. S/p bronch with therapeutic aspiration of blood clots from tracheobronchial tree.  CTA PE: Was read as acute PE with moderate thrombus burden involving both lungs. Bilateral lower extremity venous doppler ultrasounds>>  occlusive thrombus in both peroneal veins.   Pertinent  Medical History  -GERD -MS  Significant Hospital Events: Including procedures, antibiotic start and stop dates in addition to other pertinent events   11/14: Admitted by Hospitalist.  Vascular Surgery consulted 11/15:  Thrombectomy performed, massive amounts of clot removed.  Post procedure developed Acute Hemoptysis requiring emergent intubation and transfer to ICU.  PCCM consulted, plan for emergent Bronchoscopy 11/15 3 hour BRONCH to extract clots 11/16  2 units PRBC's to be given.  Repeat Bronch. 11/17: Off pressors. Increasing FiO2 requirements (60% FiO2, 5 peep), CXR with increase in bilateral opacities concerning for PNA.  NO SBT today. Place OG and start feeds. 11/18: Heparin gtt restarted yesterday with no bolus, no hemoptysis reported and Hgb remains stable.  Slow improvement in FiO2 to 60% (from 75%) with diuresis yesterday, renal function remains normal so will diurese again today 11/19: Still with high vent requirements (60% FiO2, 10 PEEP), Heparin gtt d/c due to bmall volume bright red blood from ETT.  Collagen Vascular workup sent.  Holding diuresis due to increased Na+ & BUN.  Solumedrol increased to 40 mg BID 11/20: Slight improvement in vent requirements (50% FiO2, 8 PEEP).   Worsening Leukocytosis, tracheal aspirate results pending, start Zosyn. 11/21: repeat CT overnight, vomited while in CT. 11/22: no bleeding noted. 11/23: tolerated SBT and sedation holiday - mental status continues to be sluggish 11/24: tolerated SBT and sedation holiday 11/25: tolerated SBT and sedation holiday 11/28 ABNORMAL MRI, REMIANS PVS  Interim History / Subjective:  REMAINS ON VENT OFF SEDATION ABNORMAL MRI +BRAIN DAMAGE +PVS Failure to wean from vent Resp muscle fatigue +fevers +PE   Objective   Blood pressure (!) 126/58, pulse (!) 124, temperature (!) 101.6 F (38.7 C), temperature source Axillary, resp. rate (!) 28, height 5\' 2"  (1.575 m), weight 71.2 kg, SpO2 96 %.    Vent Mode: PRVC FiO2 (%):  [35 %] 35 % Set Rate:  [16 bmp] 16 bmp Vt Set:  [440 mL] 440 mL PEEP:  [5 cmH20] 5 cmH20 Pressure Support:  [12 cmH20] 12 cmH20   Intake/Output Summary (Last 24 hours) at 03/10/2022 03/12/2022 Last data filed at 03/10/2022 0549 Gross per 24 hour  Intake 2149.8 ml  Output 1230 ml  Net 919.8 ml    Filed Weights   03/05/22 0500 03/06/22 0500 03/07/22 0500  Weight: 72.9 kg 73.1 kg 71.2 kg      REVIEW OF SYSTEMS  PATIENT IS UNABLE TO PROVIDE COMPLETE REVIEW OF SYSTEMS DUE TO SEVERE CRITICAL ILLNESS   PHYSICAL EXAMINATION:  GENERAL:critically ill appearing, +resp distress EYES: Pupils equal, round, reactive to light.  No scleral icterus.  MOUTH: Moist mucosal membrane. INTUBATED NECK: Supple.  PULMONARY: Lungs clear to auscultation, +rhonchi, +wheezing CARDIOVASCULAR: S1 and S2.  Regular rate and rhythm GASTROINTESTINAL: Soft, nontender, -distended. Positive bowel sounds.  MUSCULOSKELETAL: No swelling, clubbing, or edema.  NEUROLOGIC: obtunded SKIN:normal, warm to touch,  Capillary refill delayed  Pulses present bilaterally   Assessment & Plan:   61 year old female presenting with acute hypoxic respiratory failure secondary to pulmonary embolism complicated by  pulmonary hemorrhage following thrombectomy. She is now intubated and mechanically ventilated. Failure to wean from vent after 13 days MRI SHOWS NUMEROUS MICRO HEMORRHAGES< REMAINS IN PVS  Severe ACUTE Hypoxic and Hypercapnic Respiratory Failure -continue Mechanical Ventilator support -Wean Fio2 and PEEP as tolerated -VAP/VENT bundle implementation - Wean PEEP & FiO2 as tolerated, maintain SpO2 > 88% - Head of bed elevated 30 degrees, VAP protocol in place - Plateau pressures less than 30 cm H20  - Intermittent chest x-ray & ABG PRN - Ensure adequate pulmonary hygiene  Unable to wean from vent needs TRACH/PEG   NEUROLOGY ACUTE METABOLIC ENCEPHALOPATHY ABNORMAL MRI SIGNS OF BRAIN DAMAGE AND PVS    INFECTIOUS DISEASE -continue antibiotics as prescribed -follow up cultures -follow up ID consultation   ENDO - ICU hypoglycemic\Hyperglycemia protocol -check FSBS per protocol   GI GI PROPHYLAXIS as indicated  NUTRITIONAL STATUS DIET-->TF's as tolerated Constipation protocol as indicated   ELECTROLYTES -follow labs as needed -replace as needed -pharmacy consultation and following   Best Practice (right click and "Reselect all SmartList Selections" daily)   Diet/type: tubefeeds DVT prophylaxis: systemic heparin GI prophylaxis: H2B Lines: N/A Foley:  Yes, and it is still needed Code Status: DNR   Labs   CBC: Recent Labs  Lab 03/03/22 1232 03/04/22 0123 03/04/22 1834 03/05/22 0224 03/08/22 0628 03/08/22 0841 03/08/22 2105 03/09/22 0220 03/09/22 1943  WBC 32.8*   < > 36.5*   < > 42.5* 36.3* 39.5* 37.6* 38.1*  NEUTROABS 17.7*  --  22.6*  --   --  23.9* 25.9*  --  26.3*  HGB 10.9*   < > 9.7*   < > 8.5* 7.5* 7.8* 7.4* 7.7*  HCT 35.6*   < > 31.7*   < > 28.2* 24.5* 25.4* 24.3* 26.0*  MCV 95.2   < > 94.1   < > 94.9 93.2 93.7 93.8 95.6  PLT 135*   < > 94*   < > 178 189 241 233 308   < > = values in this interval not displayed.     Basic Metabolic  Panel: Recent Labs  Lab 03/05/22 0224 03/06/22 0513 03/07/22 0423 03/08/22 0834 03/09/22 0220 03/10/22 0621  NA 140 139 138 142 141 142  K 4.3 4.2 3.8 3.1* 3.4* 3.6  CL 100 97* 95* 100 103 109  CO2 37* 32 30 34* 29 27  GLUCOSE 124* 108* 110* 129* 132* 144*  BUN 34* 26* 23* 18 24* 25*  CREATININE 0.39* 0.32* 0.40* 0.34* 0.40* 0.35*  CALCIUM 8.8* 8.6* 8.9 8.5* 8.7* 8.6*  MG 2.2  --  2.2 2.2 2.2 2.2  PHOS 2.7  --  3.0 1.9* 2.2* 2.7    GFR: Estimated Creatinine Clearance: 70.8 mL/min (A) (by C-G formula based on SCr of 0.35 mg/dL (L)). Recent Labs  Lab 03/03/22 1916 03/04/22 0123 03/08/22 0841 03/08/22 0855 03/08/22 2105 03/09/22 0220 03/09/22 1943  PROCALCITON  --   --   --  1.45  --   --   --   WBC  --    < > 36.3*  --  39.5* 37.6* 38.1*  LATICACIDVEN 1.4  --   --   --   --   --   --    < > = values in this interval not displayed.  Liver Function Tests: Recent Labs  Lab 03/03/22 1232 03/08/22 0855  AST 36 44*  ALT 44 48*  ALKPHOS 119 215*  BILITOT 1.0 0.9  PROT 5.4* 5.6*  ALBUMIN 2.7* 2.4*    ABG    Component Value Date/Time   PHART 7.59 (H) 03/07/2022 0848   PCO2ART 43 03/07/2022 0848   PO2ART 87 03/07/2022 0848   HCO3 41.3 (H) 03/07/2022 0848   ACIDBASEDEF 4.0 (H) 02/26/2022 0307   O2SAT 98.6 03/07/2022 0848       DVT/GI PRX  assessed I Assessed the need for Labs I Assessed the need for Foley I Assessed the need for Central Venous Line Family Discussion when available I Assessed the need for Mobilization I made an Assessment of medications to be adjusted accordingly Safety Risk assessment completed  CASE DISCUSSED IN MULTIDISCIPLINARY ROUNDS WITH ICU TEAM     Critical Care Time devoted to patient care services described in this note is 55 minutes.  Critical care was necessary to treat /prevent imminent and life-threatening deterioration. Overall, patient is critically ill, prognosis is guarded.  Patient with Multiorgan failure and at  high risk for cardiac arrest and death.   RECOMMEND COMFORT CARE MEASURES   Murvin Gift Santiago Glad, M.D.  [facility]  Medical Director [facility] Medical Director [facility] Cardio-Pulmonary Department

## 2022-03-10 NOTE — IPAL (Signed)
  Interdisciplinary Goals of Care Family Meeting   Date carried out: 03/10/2022  Location of the meeting: Bedside  Member's involved: Physician and Family Member or next of kin      GOALS OF CARE DISCUSSION  The Clinical status was relayed to family in detail-[employer]  Updated and notified of patients medical condition- Patient is having a weak cough and struggling to remove secretions.   Patient with increased WOB and using accessory muscles to breathe Explained to family course of therapy and the modalities   Patient with Progressive multiorgan failure with a very high probablity of a very minimal chance of meaningful recovery despite all aggressive and optimal medical therapy.  PATIENT REMAINS DNR STATUS  Family understands the situation. SEVERE CRITICAL ILLNESS POLYNEUROPATHY PATIENT WILL NEED TRACH AND PEG FOR SURVIVAL WORK UP FOR CANCER WILL BE ADDRESSED AT A LATER TIME AS REQUESTED BY THE FAMILY   Family are satisfied with Plan of action and management. All questions answered  Additional CC time 25 mins   [name] Santiago Glad, M.D.  [facility]  Medical Director [facility] Medical Director [facility] Cardio-Pulmonary Department

## 2022-03-11 DIAGNOSIS — I2699 Other pulmonary embolism without acute cor pulmonale: Secondary | ICD-10-CM | POA: Diagnosis not present

## 2022-03-11 DIAGNOSIS — R509 Fever, unspecified: Secondary | ICD-10-CM | POA: Diagnosis not present

## 2022-03-11 DIAGNOSIS — R0489 Hemorrhage from other sites in respiratory passages: Secondary | ICD-10-CM | POA: Diagnosis not present

## 2022-03-11 LAB — BASIC METABOLIC PANEL
Anion gap: 6 (ref 5–15)
BUN: 23 mg/dL — ABNORMAL HIGH (ref 6–20)
CO2: 24 mmol/L (ref 22–32)
Calcium: 8.9 mg/dL (ref 8.9–10.3)
Chloride: 107 mmol/L (ref 98–111)
Creatinine, Ser: 0.4 mg/dL — ABNORMAL LOW (ref 0.44–1.00)
GFR, Estimated: >60 mL/min (ref >60)
Glucose, Bld: 130 mg/dL — ABNORMAL HIGH (ref 70–99)
Potassium: 3.8 mmol/L (ref 3.5–5.1)
Sodium: 137 mmol/L (ref 135–145)

## 2022-03-11 LAB — CBC
HCT: 24.9 % — ABNORMAL LOW (ref 36.0–46.0)
Hemoglobin: 7.5 g/dL — ABNORMAL LOW (ref 12.0–15.0)
MCH: 28.1 pg (ref 26.0–34.0)
MCHC: 30.1 g/dL (ref 30.0–36.0)
MCV: 93.3 fL (ref 80.0–100.0)
Platelets: 322 10*3/uL (ref 150–400)
RBC: 2.67 MIL/uL — ABNORMAL LOW (ref 3.87–5.11)
RDW: 20 % — ABNORMAL HIGH (ref 11.5–15.5)
WBC: 24.7 10*3/uL — ABNORMAL HIGH (ref 4.0–10.5)
nRBC: 0.6 % — ABNORMAL HIGH (ref 0.0–0.2)

## 2022-03-11 LAB — GLUCOSE, CAPILLARY
Glucose-Capillary: 122 mg/dL — ABNORMAL HIGH (ref 70–99)
Glucose-Capillary: 125 mg/dL — ABNORMAL HIGH (ref 70–99)
Glucose-Capillary: 128 mg/dL — ABNORMAL HIGH (ref 70–99)
Glucose-Capillary: 138 mg/dL — ABNORMAL HIGH (ref 70–99)
Glucose-Capillary: 155 mg/dL — ABNORMAL HIGH (ref 70–99)
Glucose-Capillary: 157 mg/dL — ABNORMAL HIGH (ref 70–99)

## 2022-03-11 LAB — CULTURE, RESPIRATORY W GRAM STAIN: Culture: NORMAL

## 2022-03-11 LAB — PHOSPHORUS: Phosphorus: 3.2 mg/dL (ref 2.5–4.6)

## 2022-03-11 LAB — MAGNESIUM: Magnesium: 2.3 mg/dL (ref 1.7–2.4)

## 2022-03-11 MED ORDER — LEVALBUTEROL HCL 0.63 MG/3ML IN NEBU
0.6300 mg | INHALATION_SOLUTION | Freq: Four times a day (QID) | RESPIRATORY_TRACT | Status: DC
  Administered 2022-03-11 – 2022-03-13 (×8): 0.63 mg via RESPIRATORY_TRACT
  Filled 2022-03-11 (×8): qty 3

## 2022-03-11 MED ORDER — SODIUM CHLORIDE 0.9 % IV SOLN
1.0000 g | Freq: Three times a day (TID) | INTRAVENOUS | Status: DC
  Administered 2022-03-11 – 2022-03-13 (×7): 1 g via INTRAVENOUS
  Filled 2022-03-11 (×2): qty 20
  Filled 2022-03-11: qty 1
  Filled 2022-03-11: qty 20
  Filled 2022-03-11 (×2): qty 1
  Filled 2022-03-11 (×2): qty 20

## 2022-03-11 MED ORDER — LINEZOLID 600 MG/300ML IV SOLN
600.0000 mg | Freq: Two times a day (BID) | INTRAVENOUS | Status: DC
  Administered 2022-03-11 – 2022-03-13 (×5): 600 mg via INTRAVENOUS
  Filled 2022-03-11 (×5): qty 300

## 2022-03-11 MED ORDER — IPRATROPIUM BROMIDE 0.02 % IN SOLN
0.5000 mg | Freq: Four times a day (QID) | RESPIRATORY_TRACT | Status: DC
  Administered 2022-03-11 – 2022-03-13 (×8): 0.5 mg via RESPIRATORY_TRACT
  Filled 2022-03-11 (×8): qty 2.5

## 2022-03-11 NOTE — Progress Notes (Addendum)
PHARMACY CONSULT NOTE  Pharmacy Consult for Electrolyte Monitoring and Replacement   Recent Labs: Potassium (mmol/L)  Date Value  03/11/2022 3.8   Magnesium (mg/dL)  Date Value  69/79/4801 2.3   Calcium (mg/dL)  Date Value  65/53/7482 8.9   Albumin (g/dL)  Date Value  70/78/6754 2.4 (L)   Phosphorus (mg/dL)  Date Value  49/20/1007 3.2   Sodium (mmol/L)  Date Value  03/11/2022 137   Assessment: 61 yo female presented to ED with SOB when lying flat and some right sided rib and shoulder pain.  Chest CT showed acute PE.  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 Pharmacy is asked to follow and replace electrolytes while in CCU.  Nutrition: Tube feeds at 29ml/h, FWF 100 q4h  Goal of Therapy:  Electrolytes within normal limits  Plan:  --No electrolyte replacement indicated at this time --Defer ordering of labs to primary team. Will follow along and replace where needed  Tressie Ellis 03/11/2022 7:42 AM

## 2022-03-11 NOTE — Progress Notes (Signed)
ID Remains intubated But more responsive and alert Patient Vitals for the past 24 hrs:  BP Temp Temp src Pulse Resp SpO2 Weight  03/11/22 1300 138/86 -- -- -- (!) 32 100 % --  03/11/22 1200 (!) 143/84 -- -- -- (!) 32 100 % --  03/11/22 1144 -- -- -- -- -- 98 % --  03/11/22 1100 138/85 -- -- -- (!) 30 99 % --  03/11/22 1000 (!) 141/80 -- -- -- (!) 30 98 % --  03/11/22 0900 135/70 99.7 F (37.6 C) Oral -- (!) 32 97 % --  03/11/22 0816 -- -- -- (!) 138 (!) 32 97 % --  03/11/22 0800 127/69 -- -- -- (!) 30 97 % --  03/11/22 0600 122/65 -- -- (!) 128 (!) 26 94 % --  03/11/22 0500 130/74 -- -- (!) 132 (!) 31 94 % --  03/11/22 0446 -- -- -- -- -- -- 70.5 kg  03/11/22 0400 130/72 99.5 F (37.5 C) Axillary (!) 128 (!) 33 94 % --  03/11/22 0355 -- -- -- -- -- 94 % --  03/11/22 0348 -- -- -- -- -- 94 % --  03/11/22 0300 123/66 -- -- (!) 122 (!) 30 95 % --  03/11/22 0200 128/68 -- -- (!) 122 (!) 31 95 % --  03/11/22 0100 129/63 -- -- (!) 123 (!) 31 95 % --  03/11/22 0022 -- -- -- (!) 116 (!) 25 94 % --  03/11/22 0000 126/67 99.4 F (37.4 C) Axillary (!) 117 (!) 36 95 % --  03/10/22 2312 -- -- -- -- -- 95 % --  03/10/22 2300 124/65 -- -- (!) 114 (!) 35 96 % --  03/10/22 2200 124/61 -- -- (!) 109 (!) 33 96 % --  03/10/22 2100 139/67 -- -- -- (!) 28 -- --  03/10/22 2000 132/61 99.3 F (37.4 C) Axillary -- (!) 22 96 % --  03/10/22 1923 -- -- -- -- -- 96 % --  03/10/22 1900 (!) 141/66 -- -- -- (!) 28 -- --  03/10/22 1800 137/73 -- -- (!) 128 (!) 28 96 % --  03/10/22 1700 136/68 -- -- (!) 128 (!) 28 96 % --  03/10/22 1600 130/62 (!) 100.7 F (38.2 C) Oral (!) 124 (!) 24 96 % --  03/10/22 1505 -- -- -- -- -- 96 % --  03/10/22 1500 138/68 -- -- (!) 127 (!) 28 96 % --  03/10/22 1400 121/65 (!) 100.7 F (38.2 C) Oral (!) 120 (!) 28 91 % --    Chest b/l air entry- crepts Abd soft Hs s1s2-Tachycardia Cns cannot be assessed  Labs    Latest Ref Rng & Units 03/11/2022    4:21 AM 03/10/2022     6:21 AM 03/09/2022    7:43 PM  CBC  WBC 4.0 - 10.5 K/uL 24.7  28.3  38.1   Hemoglobin 12.0 - 15.0 g/dL 7.5  7.4  7.7   Hematocrit 36.0 - 46.0 % 24.9  24.8  26.0   Platelets 150 - 400 K/uL 322  261  308        Latest Ref Rng & Units 03/11/2022    4:21 AM 03/10/2022    6:21 AM 03/09/2022    2:20 AM  CMP  Glucose 70 - 99 mg/dL 130  144  132   BUN 6 - 20 mg/dL _0 Creatinine 0.44 - 1.00 mg/dL 0.40  0.35  0.40  Sodium 135 - 145 mmol/L 137  142  141   Potassium 3.5 - 5.1 mmol/L 3.8  3.6  3.4   Chloride 98 - 111 mmol/L 107  109  103   CO2 22 - 32 mmol/L _0 Calcium 8.9 - 10.3 mg/dL 8.9  8.6  8.7     Micto 11/21 BC / tracheal culture NG 11/26 tracheal culture neg 11/27 BC - NG Cdiff neg   Impression/recommendation  61 yr female with h/o MS wheel chair bound presented with SOB and rt sided chest pain with recent covid infection   Acute PE b/l with mod- severe clot burden with strain Underwent thrombectomy Complicated by severe pulmonary hemorrhage requiring bronchoscopy to clear the clots on 02/25/22 Ongoing fever and leucocytosis- had not  responded to antibiotics-until it was changed to  meropenem and linezolid- afebrile in 24 hr and leucoctosis declining to 28. Will not stop prematurely the antibiotics even if cultures are negative- will Continue meropenem and linezolid X 4 more days  Was the fever due to pulmonary infarct? ?? ARDS ( as per pulmonologist her resp status improving ? Aspiration pneumonitis   rule out acute myelomonocytic leukemia causing fever - peripheral smear shows blasts 1-2%, monocytosis- heme onc consult, bone marrow biopsy( a patient's son died of acute leukemia in 06-05-2018 age 82 yrs)  UTI unlikely to cause such high leucocytosis and she has been on zosyn - renal ultrasound N   IF fever does not resolve and leucocytosis persist will have to investigate for AMML Discussed the management with care team

## 2022-03-11 NOTE — Progress Notes (Signed)
NAME:  [name], MRN:  [id number], DOB:  [birth year], LOS: 32 ADMISSION DATE:  02/24/2022, CONSULTATION DATE:  02/25/2022 REFERRING MD:  Dr. Lucky Cowboy, CHIEF COMPLAINT:  Acute Respiratory Distress & hypoxia, development of Hemoptysis post thrombectomy  Brief Pt Description / Synopsis:  61 y.o. female admitted with Acute Hypoxic Respiratory Failure in setting of Bilateral Pulmonary Embolism.  Underwent thrombectomy with Vascular Surgery, post procedure developed Hemoptysis leading to severe blockage of airways requiring emergent intubation. S/p bronch  therapeutic aspiration of blood clots from tracheobronchial tree.  History of Present Illness:  [name] is a 61 year old female with history of hypertension, depression, anxiety, restless leg syndrome, GERD, who presents emergency department for chief concerns of shortness of breath via POV.  Pt is currently intubated and sedation and unable to contribute to history, therefore history is obtained from chart review.   Per ED and nursing notes, she reported that she developed shortness of breath 3 to 4 days ago.  She denies known fever at home and trauma to her person.  She denies chest pain, abdominal pain, dysuria, hematuria.  She did have 1 episode of diarrhea today.  She denies blood in her stool.  She endorses having COVID about 4 weeks ago.   She denies recent international travel or long car trips.  She denies history of cancer that she knows of.  She denies recent surgery.   She denies changes to her leg pain.  She states at baseline she has leg pain because of MS and restless leg syndrome.  ED Course: Initial Vital Signs:  temperature of 99.1, respiration rate of 20, heart rate of 140, blood pressure 124/59, SPO2 of 94% on room air.  Significant Labs: Serum sodium is 138, potassium 2.7, chloride 106, bicarb 23, BUN of 14, serum creatinine 0.62, EGFR greater than 60, nonfasting blood glucose 106, WBC 15.7, hemoglobin  9.3, platelets of 231.  Imaging CTA PE: Was read as acute PE with moderate thrombus burden involving both lungs.  RV-LV ratio is 1.3 suggesting acute or chronic right heart dysfunction.  Small right pleural effusion.  Patchy infiltrates in the perihilar regions of both lung fields, right more than left.  Moderate to large size fixed hiatal hernia.  Bilateral lower extremity venous doppler ultrasounds>>  occlusive thrombus in both peroneal veins.  Medications Administered: Ceftriaxone 2 g IV, vancomycin, LR 150 mL bolus, doxycycline.   Hospitalist were asked to admit for further workup and treatment.  Vascular Surgery was consulted with plan for Thrombectomy.  Please see "significant hospital events" section below for full detailed hospital course.    Pertinent  Medical History   Past Medical History:  Diagnosis Date   GERD (gastroesophageal reflux disease)    Multiple sclerosis ([place])      Micro Data:  11/14: Blood culture x2>>NGTD 11/15: MRSA PCR>>negative 11/21: Blood x2>>negative  11/22: COVID-19>>positive  11/26: Respiratory panel >>negative  11/26: MRSA PCR>>negative  11/26: Cdiff>>negative  11/27: Blood x4>>negative   Antimicrobials:   Antibiotics Given (last 72 hours)     Date/Time Action Medication Dose Rate   03/08/22 1532 New Bag/Given   piperacillin-tazobactam (ZOSYN) IVPB 3.375 g 3.375 g 12.5 mL/hr   03/08/22 2147 New Bag/Given   meropenem (MERREM) 1 g in sodium chloride 0.9 % 100 mL IVPB 1 g 200 mL/hr   03/08/22 2204 New Bag/Given   linezolid (ZYVOX) IVPB 600 mg 600 mg 300 mL/hr   03/09/22 0528 New Bag/Given   meropenem (MERREM) 1 g in sodium chloride 0.9 %  100 mL IVPB 1 g 200 mL/hr   03/09/22 1112 New Bag/Given   linezolid (ZYVOX) IVPB 600 mg 600 mg 300 mL/hr   03/09/22 1558 New Bag/Given   meropenem (MERREM) 1 g in sodium chloride 0.9 % 100 mL IVPB 1 g 200 mL/hr   03/09/22 2201 New Bag/Given   meropenem (MERREM) 1 g in sodium chloride 0.9 % 100 mL IVPB 1  g 200 mL/hr   03/09/22 2203 New Bag/Given   linezolid (ZYVOX) IVPB 600 mg 600 mg 300 mL/hr   03/10/22 0528 New Bag/Given   meropenem (MERREM) 1 g in sodium chloride 0.9 % 100 mL IVPB 1 g 200 mL/hr   03/10/22 0820 New Bag/Given   linezolid (ZYVOX) IVPB 600 mg 600 mg 300 mL/hr   03/10/22 1355 New Bag/Given   meropenem (MERREM) 1 g in sodium chloride 0.9 % 100 mL IVPB 1 g 200 mL/hr   03/10/22 2104 New Bag/Given   meropenem (MERREM) 1 g in sodium chloride 0.9 % 100 mL IVPB 1 g 200 mL/hr   03/10/22 2226 New Bag/Given   linezolid (ZYVOX) IVPB 600 mg 600 mg 300 mL/hr   03/11/22 0610 New Bag/Given   meropenem (MERREM) 1 g in sodium chloride 0.9 % 100 mL IVPB 1 g 200 mL/hr      Significant Hospital Events: Including procedures, antibiotic start and stop dates in addition to other pertinent events   11/14: Admitted by Hospitalist.  Vascular Surgery consulted 11/15:  Thrombectomy performed, massive amounts of clot removed.  Post procedure developed Acute Hemoptysis requiring emergent intubation and transfer to ICU.  PCCM consulted, plan for emergent Bronchoscopy 11/15 3 hour BRONCH to extract clots 11/16  2 units PRBC's to be given.  Repeat Bronch. 11/17: Off pressors. Increasing FiO2 requirements (60% FiO2, 5 peep), CXR with increase in bilateral opacities concerning for PNA.  NO SBT today. Place OG and start feeds. 11/18: Heparin gtt restarted yesterday with no bolus, no hemoptysis reported and Hgb remains stable.  Slow improvement in FiO2 to 60% (from 75%) with diuresis yesterday, renal function remains normal so will diurese again today 11/19: Still with high vent requirements (60% FiO2, 10 PEEP), Heparin gtt d/c due to bmall volume bright red blood from ETT.  Collagen Vascular workup sent.  Holding diuresis due to increased Na+ & BUN.  Solumedrol increased to 40 mg BID 11/20: Slight improvement in vent requirements (50% FiO2, 8 PEEP).  Worsening Leukocytosis, tracheal aspirate results pending,  start Zosyn. 11/21: repeat CT overnight, vomited while in CT. 11/22: no bleeding noted. 11/23: tolerated SBT and sedation holiday - mental status continues to be sluggish 11/24: tolerated SBT and sedation holiday 11/25: tolerated SBT and sedation holiday 11/28: ABNORMAL MRI, REMIANS PVS 11/29: Pt able to nod yes/no and blink on commands and respond to questions appropriately by nodding yes/no; still unable to follow commands on BLU/BLL extremities; will consult PT/OT   Interim History / Subjective:  As outlined above   Objective   Blood pressure 122/65, pulse (!) 128, temperature 99.5 F (37.5 C), temperature source Axillary, resp. rate (!) 26, height _0  (1.575 m), weight 70.5 kg, SpO2 94 %.    Vent Mode: PRVC FiO2 (%):  [35 %] 35 % Set Rate:  [16 bmp] 16 bmp Vt Set:  [440 mL] 440 mL PEEP:  [5 cmH20] 5 cmH20 Pressure Support:  [12 cmH20] 12 cmH20   Intake/Output Summary (Last 24 hours) at 03/11/2022 0811 Last data filed at 03/11/2022 0600 Gross per 24 hour  Intake 1902.16 ml  Output 1300 ml  Net 602.16 ml   Filed Weights   03/06/22 0500 03/07/22 0500 03/11/22 0446  Weight: 73.1 kg 71.2 kg 70.5 kg   Examination: General: Acute on chronic ill-appearing female, NAD mechanically intubated  HENT: Atraumatic, normocephalic, neck supple, no JVD present  Lungs: Faint rhonchi throughout,  present, even, non labored Cardiovascular: Sinus tachycardia, s1s2, no r/g, 2+ radial/2+ distal pulses, no edema  Abdomen: +BS x4, obese, soft, non tender, non distended  Extremities: Able to nod yes/no and blink on commands and respond to questions appropriately by nodding yes/no; still unable to follow commands on BLU/BLL extremities GU: Indwelling foley catheter in place draining yellow urine   REVIEW OF SYSTEMS PATIENT IS UNABLE TO PROVIDE COMPLETE REVIEW OF SYSTEMS DUE TO [facility] Problem list     Assessment & Plan:   Severe ACUTE Hypoxic  Respiratory Failure in setting of Bilateral Pulmonary Embolism, Hemoptysis, and presumed Aspiration  S/p Bronchoscopy x2 - Full vent support, implement lung protective strategies - Plateau pressures less than 30 cm H20 - Wean FiO2 & PEEP as tolerated to maintain O2 sats >92% - Follow intermittent Chest X-ray & ABG as needed - Spontaneous Breathing Trials when respiratory parameters met and mental status permits - Implement VAP Bundle - Bronchodilators & Pulmicort nebs - ABX as above - Aggressive pulmonary toilet as able - Due to failure to wean from ventilator secondary to severe acute metabolic encephalopathy will place ENT consult for tracheostomy   Bilateral Pulmonary Embolism & Bilateral peroneal lower extremity thrombus, suspect in setting of recent COVID-19 infection about 3 weeks prior Hypotension: Related to sedation +/- acute blood loss with hemoptysis +/- sepsis from aspiration/pneumonia~resolved  Sinus tachycardia  Echocardiogram 02/24/22: LVEF 64-40%, normal diastolic parameters, RV size and systolic function normal S/p Thrombectomy 11/15 - Continuous cardiac monitoring - Continue propranolol  - Vascular Surgery following, appreciate input  Presumed VAP vs Aspiration pneumonitis  - Trend WBC and monitor fever curve  - Continue abx as outlined above - ID consulted appreciate input: continue meropenem and linezolid for now   Sedation needs in setting of mechanical ventilation Severe acute metabolic encephalopathy  PMHx: MS  MRI Brain 03/09/22: No acute infarct but does reveal numerous chronic microhemorrhages which are new from the scan from few months ago - Maintain a RASS goal of 0 - Currently not requiring continuous sedating medications  - Prn dilaudid for pain management  - Neurology consulted appreciate input   Hyperglycemia - CBG's q4h; Target range of 140 to 180 - SSI - Follow ICU Hypo/Hyperglycemia protocol  Best Practice (right click and "Reselect all  SmartList Selections" daily)   Diet/type: NPO, Continue TF's DVT prophylaxis: SCD GI prophylaxis: PPI Lines: N/A Foley:  Indwelling foley catheter and still needed  Code Status:  full code  11/29: Pt's significant other updated at bedside. Labs   CBC: Recent Labs  Lab 03/04/22 1834 03/05/22 0224 03/08/22 0841 03/08/22 2105 03/09/22 0220 03/09/22 1943 03/10/22 0621 03/11/22 0421  WBC 36.5*   < > 36.3* 39.5* 37.6* 38.1* 28.3* 24.7*  NEUTROABS 22.6*  --  23.9* 25.9*  --  26.3*  --   --   HGB 9.7*   < > 7.5* 7.8* 7.4* 7.7* 7.4* 7.5*  HCT 31.7*   < > 24.5* 25.4* 24.3* 26.0* 24.8* 24.9*  MCV 94.1   < > 93.2 93.7 93.8 95.6 95.0 93.3  PLT 94*   < > 189 241 233  308 261 322   < > = values in this interval not displayed.    Basic Metabolic Panel: Recent Labs  Lab 03/07/22 0423 03/08/22 0834 03/09/22 0220 03/10/22 0621 03/11/22 0421  NA 138 142 141 142 137  K 3.8 3.1* 3.4* 3.6 3.8  CL 95* 100 103 109 107  CO2 30 34* _0 GLUCOSE 110* 129* 132* 144* 130*  BUN 23* 18 24* 25* 23*  CREATININE 0.40* 0.34* 0.40* 0.35* 0.40*  CALCIUM 8.9 8.5* 8.7* 8.6* 8.9  MG 2.2 2.2 2.2 2.2 2.3  PHOS 3.0 1.9* 2.2* 2.7 3.2   GFR: Estimated Creatinine Clearance: 70.5 mL/min (A) (by C-G formula based on SCr of 0.4 mg/dL (L)). Recent Labs  Lab 03/08/22 0855 03/08/22 2105 03/09/22 0220 03/09/22 1943 03/10/22 0621 03/11/22 0421  PROCALCITON 1.45  --   --   --   --   --   WBC  --    < > 37.6* 38.1* 28.3* 24.7*   < > = values in this interval not displayed.    Liver Function Tests: Recent Labs  Lab 03/08/22 0855  AST 44*  ALT 48*  ALKPHOS 215*  BILITOT 0.9  PROT 5.6*  ALBUMIN 2.4*   No results for input(s): "LIPASE", "AMYLASE" in the last 168 hours. No results for input(s): "AMMONIA" in the last 168 hours.  ABG    Component Value Date/Time   PHART 7.59 (H) 03/07/2022 0848   PCO2ART 43 03/07/2022 0848   PO2ART 87 03/07/2022 0848   HCO3 41.3 (H) 03/07/2022 0848    ACIDBASEDEF 4.0 (H) 02/26/2022 0307   O2SAT 98.6 03/07/2022 0848     Coagulation Profile: Home Medications  Prior to Admission medications   Medication Sig Start Date End Date Taking? Authorizing Provider  cyanocobalamin 1000 MCG tablet Take by mouth.   Yes [provider]  DULoxetine (CYMBALTA) 30 MG capsule Take 30 mg by mouth daily. 06/25/21  Yes [provider]  gabapentin (NEURONTIN) 400 MG capsule Take Gabapentin 400 mg in the morning, 400 mg in the afternoon, and 800 mg at night 12/29/21  Yes [provider]  omeprazole (PRILOSEC) 20 MG capsule Take 20 mg by mouth daily. 06/10/21  Yes [provider]  propranolol (INDERAL) 20 MG tablet Take 1 tablet by mouth 2 (two) times daily. 12/16/21  Yes [provider]  rOPINIRole (REQUIP) 0.25 MG tablet Take 0.25 mg at night for 1 week, then increase to 0.5 mg at night 05/29/21  Yes [provider]  tolterodine (DETROL LA) 2 MG 24 hr capsule Take 2 mg by mouth 2 (two) times daily. 12/29/21 12/29/22 Yes [provider]  ascorbic acid (VITAMIN C) 500 MG tablet Take by mouth.    [provider]  Cholecalciferol 50 MCG (2000 UT) CAPS Take by mouth.    [provider]  cyclobenzaprine (FLEXERIL) 10 MG tablet Take 10 mg by mouth 2 (two) times daily as needed. 11/26/21   [provider]  dalfampridine 10 MG TB12 Take 1 tablet by mouth every 12 (twelve) hours. 12/29/21   [provider]  meloxicam (MOBIC) 15 MG tablet Take 1 tablet (15 mg total) by mouth daily. 08/23/21 08/23/22  Cuthriell, Charline Bills, PA-C  methylPREDNISolone (MEDROL DOSEPAK) 4 MG TBPK tablet Take by mouth. 11/26/21   [provider]  naproxen (NAPROSYN) 500 MG tablet Take by mouth.    [provider]  nitrofurantoin, macrocrystal-monohydrate, (MACROBID) 100 MG capsule Take 1 capsule (100 mg total)  by mouth 2 (two) times daily. 01/19/22   Immordino, Annie Main, FNP  pregabalin (LYRICA) 75  MG capsule Take 75 mg by mouth 3 (three) times daily. 08/18/21   [provider]  traMADol (ULTRAM) 50 MG tablet Take 50 mg by mouth 3 (three) times daily as needed. 12/26/21   [provider]      Critical Care Time: 40 minutes   Donell Beers, [place] Pager [phone] (please enter 7 digits) PCCM Consult Pager [phone] (please enter 7 digits)

## 2022-03-11 NOTE — Consult Note (Signed)
..  [name], [name] [id number] 01-07-1961 [name] Fulling, MD  Reason for Consult: tracheostomy tube placement  HPI: 61 year old female with history of MS and recent COVID infection presented to ER on 02/24/2022 with bilateral DVTs and PE.  Thrombectomy performed and post-procedure developed hemoptysis requiring emergent intubation and emergent Bronchoscopy.  Patient has been unable to be weaned from ventilatory support and tracheostomy tube was requested.  Allergies:  Allergies  Allergen Reactions   Erythromycin Hives and Nausea And Vomiting   Lexapro [Escitalopram Oxalate] Hives    ROS: Review of systems normal other than 12 systems except per HPI.  PMH:  Past Medical History:  Diagnosis Date   GERD (gastroesophageal reflux disease)    Multiple sclerosis ([facility])     FH: History reviewed. No pertinent family history.  SH:  Social History   Socioeconomic History   Marital status: Widowed    Spouse name: Not on file   Number of children: Not on file   Years of education: Not on file   Highest education level: Not on file  Occupational History   Not on file  Tobacco Use   Smoking status: Never   Smokeless tobacco: Never  Vaping Use   Vaping Use: Never used  Substance and Sexual Activity   Alcohol use: Yes    Comment: Social   Drug use: Never   Sexual activity: Not Currently  Other Topics Concern   Not on file  Social History Narrative   Not on file   Social Determinants of Health   Financial Resource Strain: Not on file  Food Insecurity: Not on file  Transportation Needs: Not on file  Physical Activity: Not on file  Stress: Not on file  Social Connections: Not on file  Intimate Partner Violence: Not on file    PSH:  Past Surgical History:  Procedure Laterality Date   ESOPHAGOGASTRODUODENOSCOPY (EGD) WITH PROPOFOL N/A 09/11/2021   Procedure: ESOPHAGOGASTRODUODENOSCOPY (EGD) WITH PROPOFOL;  Surgeon: [name] Collins, DO;  Location: [facility] ENDOSCOPY;   Service: Gastroenterology;  Laterality: N/A;   PULMONARY THROMBECTOMY Bilateral 02/25/2022   Procedure: PULMONARY THROMBECTOMY;  Surgeon: [name] Needy, MD;  Location: [facility] INVASIVE CV LAB;  Service: Cardiovascular;  Laterality: Bilateral;   TUBAL LIGATION      Physical  Exam:  GEN- supine in NAD on ventilator NEURO-  made eye contact and nodded head appropriately to questions NECK- supple with no LAD, normal thyroid landmarks     A/P: PE and respiratory failure with failure to wean from ventilatory support  Plan:  Discussed findings and plan with patient and boyfriend.  Reviewed tracheostomy and purpose to help wean from ventilatory support but that it may be permanent or temporary.  Discussed that despite tracheostomy tube she may still require ventilator as well.  Patient nodded her head in understanding of procedure, but did not nod head when asked if she wished to proceed at this time.  It appears that she wants to think about it some more.  Will discuss with family member who is consent signer later today.  Tentatively placed patient on schedule for Tuesday morning at 7:30 depending on patient's decision to proceed.   [name] 03/11/2022 1:19 PM

## 2022-03-11 NOTE — Progress Notes (Signed)
Nasal flaring noted. Placed patient back on rate at this time. RN is aware.

## 2022-03-11 NOTE — Plan of Care (Signed)
Consult noted for goals of care.  Notes reviewed.  Multiple goals of care conversations noted by CCM, and plans are currently in place for DNR status, and tracheostomy with LTACH placement.  As per conversation with CCM attending, palliative will DC consult at this time.  Please reconsult if needs arise.

## 2022-03-11 NOTE — Progress Notes (Signed)
PT Cancellation Note  Patient Details Name: Lylianna Fraiser MRN: [id number] DOB: 30-Dec-1960   Cancelled Treatment:    Reason Eval/Treat Not Completed: Patient's level of consciousness: Pt lethargic, not opening eyes, and not following commands per OT with recommendation to hold PT eval this date.  Will attempt to see pt at a future date/time as medically appropriate.    Ovidio Hanger PT, DPT 03/11/22, 2:37 PM

## 2022-03-11 NOTE — TOC Progression Note (Signed)
Transition of Care [facility]) - Progression Note    Patient Details  Name: [name] MRN: [id number] Date of Birth: July 18, 1960  [facility]) CM/SW Contact  Allayne Butcher, RN Phone Number: 03/11/2022, 10:43 AM  Clinical Narrative:    Current plan will be for patient to get a tracheostomy.  Critical care has consulted ENT.  Patient will be a candidate for LTACH.         Expected Discharge Plan and Services                                                 Social Determinants of Health (SDOH) Interventions    Readmission Risk Interventions     No data to display

## 2022-03-11 NOTE — Progress Notes (Signed)
Nutrition Follow Up Note   DOCUMENTATION CODES:   Not applicable  INTERVENTION:   Continue Vital 1.5_0 /hr + ProSource TF 20- Give 3m daily via tube  Free water flushes 1036mq4 hours   Regimen provides 1880kcal/day, 101g/day protein and 151659may of free water.   Juven Fruit Punch BID via tube, each serving provides 95kcal and 2.5g of protein (amino acids glutamine and arginine)  Daily weights  NUTRITION DIAGNOSIS:   Inadequate oral intake related to inability to eat (pt sedated and ventilated) as evidenced by NPO status.  GOAL:   Provide needs based on ASPEN/SCCM guidelines -met   MONITOR:   Vent status, Labs, Weight trends, TF tolerance, Skin, I & O's  ASSESSMENT:   [age]o female with h/o MS, GERD and RLS who is admitted with bilateral PEs s/p thrombectomy 11/15. Pt also with new PNA.  Pt remains sedated and ventilated. OGT in place. Pt tolerating tube feeds at goal rate. Refeed labs stable. Per chart, pt is up ~ ~29lbs from her UBW but her bed weights have been fairly consistent since admission. RD unsure is bed weights are correct. Pt +6.3L on her I & Os. Pt does not appear volume overloaded. Pt is more alert today and is able to follow some commands. MRI brain reporting no acute infarct but does reveal numerous chronic microhemorrhages which are new from the scan from few months ago. Plan is for trach/G-tube; IR has been consulted. Likely LTACH at discharge.   Medications reviewed and include: B12, insulin, juven, protonix  Labs reviewed: K 3.8 wnl, BUN 23(H), creat 0.40(L), P 3.2 wnl, Mg 2.3 wnl Wbc- 24.7(H), Hgb 7.5(L), Hct 24.9(L) Cbgs- 157, 122, 128, 138 x 24 hrs  Patient is currently intubated on ventilator support MV: 13.2 L/min Temp (24hrs), Avg:99.7 F (37.6 C), Min:99.3 F (37.4 C), Max:100.7 F (38.2 C)  Propofol: none   MAP- >8m41m  UOP- 1300ml82miet Order:   Diet Order             Diet NPO time specified  Diet effective now                   EDUCATION NEEDS:   No education needs have been identified at this time  Skin:  Skin Assessment: Reviewed RN Assessment (right foot- 1.5cm x 1.0cm x 0.1cm)  Last BM:  11/29- type 7- 300ml 27mrectal tube  Height:   Ht Readings from Last 1 Encounters:  02/26/22 _1  (1.575 m)    Weight:   Wt Readings from Last 1 Encounters:  03/11/22 70.5 kg    Ideal Body Weight:  50 kg  BMI:  Body mass index is 28.43 kg/m.  Estimated Nutritional Needs:   Kcal:  1700-2000kcal/day  Protein:  90-100g/day  Fluid:  1.5-1.8L/day  [name] [name] DistanceD, LDN Please [facility] to [facility] and/or RD on-call/weekend/after hours pager

## 2022-03-11 NOTE — Evaluation (Signed)
Occupational Therapy Evaluation Patient Details Name: [name] MRN: [id number] DOB: 1960/09/06 Today's Date: 03/11/2022   History of Present Illness 61 year old female with history of hypertension, depression, anxiety, restless leg syndrome, GERD, who presents emergency department for chief concerns of shortness of breath via POV. Pt found to be positive for covid with PE and DVT noted.   Clinical Impression   Patient presenting with decreased Ind in self care,balance, functional mobility/transfers, endurance, and safety awareness. Pt initially opens eyes when therapist first enters the room but does not again for remainder of session. Pt's significant other joins therapist during assessment. He reports that she lives with him and needs occasional assistance for self care tasks. She primarily uses electric scooter for mobility but does ambulate short distance of less than 10' with RW.  Pt very lethargic and unable to follow any verbal commands. She is on vent during this session. Her significant other reports she was alert yesterday and this morning. Pt needing total A to roll to the R to place into side lying. She grimaces with PROM of B UEs and B LEs. Patient will benefit from acute OT to increase overall independence in the areas of ADLs, functional mobility, and safety awareness in order to safely discharge to next venue of care.      Recommendations for follow up therapy are one component of a multi-disciplinary discharge planning process, led by the attending physician.  Recommendations may be updated based on patient status, additional functional criteria and insurance authorization.   Follow Up Recommendations  OT at Long-term acute care hospital     Assistance Recommended at Discharge Frequent or constant Supervision/Assistance     Functional Status Assessment  Patient has had a recent decline in their functional status and demonstrates the ability to make significant  improvements in function in a reasonable and predictable amount of time.  Equipment Recommendations  Other (comment) (defer to next venue of care)       Precautions / Restrictions Precautions Precautions: Fall Precaution Comments: vent support and airborne precautions      Mobility Bed Mobility Overal bed mobility: Needs Assistance Bed Mobility: Rolling Rolling: Total assist              Transfers                   General transfer comment: not attempted secondary to lethargy on vent          ADL either performed or assessed with clinical judgement   ADL                                         General ADL Comments: total dependent care at this time     Vision Patient Visual Report: Other (comment) (pt does not open eyes during assessment)              Pertinent Vitals/Pain Pain Assessment Pain Assessment: Faces Faces Pain Scale: Hurts little more Pain Location: generalized Pain Descriptors / Indicators: Grimacing Pain Intervention(s): Monitored during session, Repositioned, Premedicated before session     Hand Dominance Right   Extremity/Trunk Assessment Upper Extremity Assessment Upper Extremity Assessment: Generalized weakness;RUE deficits/detail;LUE deficits/detail RUE Deficits / Details: PROM is WNLs but pt does grimace at times LUE Deficits / Details: PROM is WNLs but pt does grimace at times   Lower Extremity Assessment Lower Extremity Assessment: Generalized weakness  Communication Communication Communication: Other (comment) (on vent)   Cognition                                       General Comments: Pt with eyes closed throughout session but does grimace with PROM. Significant other present.                Home Living Family/patient expects to be discharged to:: Private residence Living Arrangements: Spouse/significant other Available Help at Discharge: Family;Available 24  hours/day Type of Home: House Home Access: Ramped entrance     Home Layout: One level     Bathroom Shower/Tub: Walk-in shower         Home Equipment: Agricultural consultant (2 wheels);Shower seat;Electric scooter          Prior Functioning/Environment Prior Level of Function : Needs assist             Mobility Comments: Pt primarily uses electric scooter in home but does ambulate into bathroom with use of RW ADLs Comments: Min A from significant other for self care tasks as needed.        OT Problem List: Decreased strength;Pain;Cardiopulmonary status limiting activity;Decreased activity tolerance;Decreased safety awareness;Impaired balance (sitting and/or standing);Decreased knowledge of use of DME or AE;Impaired UE functional use      OT Treatment/Interventions: Self-care/ADL training;Therapeutic exercise;Therapeutic activities;Energy conservation;DME and/or AE instruction;Manual therapy;Balance training;Patient/family education;Cognitive remediation/compensation    OT Goals(Current goals can be found in the care plan section) Acute Rehab OT Goals Patient Stated Goal: to get stronger OT Goal Formulation: With family Time For Goal Achievement: 03/25/22 Potential to Achieve Goals: Fair ADL Goals Pt Will Perform Grooming: with mod assist;sitting Pt Will Transfer to Toilet: with mod assist;stand pivot transfer Pt Will Perform Toileting - Clothing Manipulation and hygiene: with mod assist;sit to/from stand Pt/caregiver will Perform Home Exercise Program: With written HEP provided;With Supervision;Both right and left upper extremity  OT Frequency: Min 2X/week       AM-PAC OT "6 Clicks" Daily Activity     Outcome Measure Help from another person eating meals?: Total Help from another person taking care of personal grooming?: Total Help from another person toileting, which includes using toliet, bedpan, or urinal?: Total Help from another person bathing (including washing,  rinsing, drying)?: Total Help from another person to put on and taking off regular upper body clothing?: Total Help from another person to put on and taking off regular lower body clothing?: Total 6 Click Score: 6   End of Session Equipment Utilized During Treatment: Other (comment) (vent support) Nurse Communication: Mobility status  Activity Tolerance: Patient limited by lethargy Patient left: in bed;with call bell/phone within reach;with family/visitor present  OT Visit Diagnosis: Muscle weakness (generalized) (M62.81);Unsteadiness on feet (R26.81);Pain Pain - Right/Left:  (generalized)                Time: 1610-9604 OT Time Calculation (min): 26 min Charges:  OT General Charges $OT Visit: 1 Visit OT Evaluation $OT Eval Moderate Complexity: 1 Mod OT Treatments $Therapeutic Activity: 8-22 mins  Jackquline Denmark, MS, OTR/L , CBIS ascom [phone]  03/11/22, 4:47 PM

## 2022-03-11 NOTE — Progress Notes (Signed)
Tylenol 650mg  given via OG for temperature of 100.3

## 2022-03-12 DIAGNOSIS — I2699 Other pulmonary embolism without acute cor pulmonale: Secondary | ICD-10-CM | POA: Diagnosis not present

## 2022-03-12 DIAGNOSIS — R0489 Hemorrhage from other sites in respiratory passages: Secondary | ICD-10-CM | POA: Diagnosis not present

## 2022-03-12 DIAGNOSIS — R509 Fever, unspecified: Secondary | ICD-10-CM | POA: Diagnosis not present

## 2022-03-12 DIAGNOSIS — G35 Multiple sclerosis: Secondary | ICD-10-CM | POA: Diagnosis not present

## 2022-03-12 LAB — CBC WITH DIFFERENTIAL/PLATELET
Abs Immature Granulocytes: 3.44 10*3/uL — ABNORMAL HIGH (ref 0.00–0.07)
Basophils Absolute: 0.3 10*3/uL — ABNORMAL HIGH (ref 0.0–0.1)
Basophils Relative: 1 %
Eosinophils Absolute: 0.1 10*3/uL (ref 0.0–0.5)
Eosinophils Relative: 0 %
HCT: 29.1 % — ABNORMAL LOW (ref 36.0–46.0)
Hemoglobin: 8.9 g/dL — ABNORMAL LOW (ref 12.0–15.0)
Immature Granulocytes: 13 %
Lymphocytes Relative: 3 %
Lymphs Abs: 0.8 10*3/uL (ref 0.7–4.0)
MCH: 28.7 pg (ref 26.0–34.0)
MCHC: 30.6 g/dL (ref 30.0–36.0)
MCV: 93.9 fL (ref 80.0–100.0)
Monocytes Absolute: 5.9 10*3/uL — ABNORMAL HIGH (ref 0.1–1.0)
Monocytes Relative: 23 %
Neutro Abs: 15.3 10*3/uL — ABNORMAL HIGH (ref 1.7–7.7)
Neutrophils Relative %: 60 %
Platelets: 303 10*3/uL (ref 150–400)
RBC: 3.1 MIL/uL — ABNORMAL LOW (ref 3.87–5.11)
RDW: 19.9 % — ABNORMAL HIGH (ref 11.5–15.5)
WBC: 25.7 10*3/uL — ABNORMAL HIGH (ref 4.0–10.5)
nRBC: 0.2 % (ref 0.0–0.2)

## 2022-03-12 LAB — MAGNESIUM: Magnesium: 2.2 mg/dL (ref 1.7–2.4)

## 2022-03-12 LAB — GLUCOSE, CAPILLARY
Glucose-Capillary: 118 mg/dL — ABNORMAL HIGH (ref 70–99)
Glucose-Capillary: 127 mg/dL — ABNORMAL HIGH (ref 70–99)
Glucose-Capillary: 131 mg/dL — ABNORMAL HIGH (ref 70–99)
Glucose-Capillary: 133 mg/dL — ABNORMAL HIGH (ref 70–99)
Glucose-Capillary: 138 mg/dL — ABNORMAL HIGH (ref 70–99)
Glucose-Capillary: 144 mg/dL — ABNORMAL HIGH (ref 70–99)
Glucose-Capillary: 82 mg/dL (ref 70–99)

## 2022-03-12 LAB — BASIC METABOLIC PANEL
Anion gap: 9 (ref 5–15)
BUN: 20 mg/dL (ref 6–20)
CO2: 28 mmol/L (ref 22–32)
Calcium: 9 mg/dL (ref 8.9–10.3)
Chloride: 102 mmol/L (ref 98–111)
Creatinine, Ser: 0.36 mg/dL — ABNORMAL LOW (ref 0.44–1.00)
GFR, Estimated: >60 mL/min (ref >60)
Glucose, Bld: 125 mg/dL — ABNORMAL HIGH (ref 70–99)
Potassium: 3.8 mmol/L (ref 3.5–5.1)
Sodium: 139 mmol/L (ref 135–145)

## 2022-03-12 LAB — PHOSPHORUS: Phosphorus: 3 mg/dL (ref 2.5–4.6)

## 2022-03-12 MED ORDER — NUTRISOURCE FIBER PO PACK
1.0000 | PACK | Freq: Two times a day (BID) | ORAL | Status: DC
  Administered 2022-03-12 – 2022-03-17 (×10): 1
  Filled 2022-03-12 (×11): qty 1

## 2022-03-12 MED ORDER — ENOXAPARIN SODIUM 40 MG/0.4ML IJ SOSY
40.0000 mg | PREFILLED_SYRINGE | INTRAMUSCULAR | Status: DC
  Administered 2022-03-12: 40 mg via SUBCUTANEOUS
  Filled 2022-03-12: qty 0.4

## 2022-03-12 MED ORDER — OSMOLITE 1.5 CAL PO LIQD
1000.0000 mL | ORAL | Status: DC
  Administered 2022-03-14 (×2): 1000 mL

## 2022-03-12 NOTE — Progress Notes (Signed)
PT Cancellation Note  Patient Details Name: Desaray Marschner MRN: [id number] DOB: 21-Sep-1960   Cancelled Treatment:    Reason Eval/Treat Not Completed: Medical issues which prohibited therapy. Patient with MEWS of 5, , increased, RR, HR and fever, B LE thrombus with potential thrombectomy in the future. Will continue to follow and see when patient medically appropriate.     Sivan Quast 03/12/2022, 8:45 AM

## 2022-03-12 NOTE — Progress Notes (Signed)
Nutrition Follow Up Note   DOCUMENTATION CODES:   Not applicable  INTERVENTION:   Change to Osmolite 1.5_0 /hr + ProSource TF 20- Give 35m daily via tube  Free water flushes 109mq4 hours   Regimen provides 1880kcal/day, 95g/day protein and 151420may of free water.   Juven Fruit Punch BID via tube, each serving provides 95kcal and 2.5g of protein (amino acids glutamine and arginine)  Daily weights  Add Nutrisource fiber BID via tube   NUTRITION DIAGNOSIS:   Inadequate oral intake related to inability to eat (pt sedated and ventilated) as evidenced by NPO status.  GOAL:   Provide needs based on ASPEN/SCCM guidelines -met   MONITOR:   Vent status, Labs, Weight trends, TF tolerance, Skin, I & O's  ASSESSMENT:   [age]o female with h/o MS, GERD and RLS who is admitted with bilateral PEs s/p thrombectomy 11/15. Pt also with new PNA.  Pt remains sedated and ventilated. OGT in place. Pt tolerating tube feeds at goal rate. Pt is having large amounts of diarrhea. C-diff is negative. RD will add soluble fiber. Refeed labs stable. Per chart, pt is up ~ ~27lbs from her UBW but her bed weights have been fairly consistent since admission. RD unsure is bed weights are correct. Pt +5.1L on her I & Os. Plan is for trach/G-tube; IR has been consulted. Likely LTACH at discharge.   Vital 1.5 currently unavailable; will change to Osmolite.   Medications reviewed and include: B12, insulin, juven, protonix, linezolid, meropenem   Labs reviewed: K 3.8 wnl, creat 0.36(L), P 3.0 wnl, Mg 2.2 wnl Wbc- 25.7(H), Hgb 8.9(L), Hct 29.1(L) Cbgs- 118, 144, 82, 127, 131 x 24 hrs  Patient is currently intubated on ventilator support MV: 11.7 L/min Temp (24hrs), Avg:99.7 F (37.6 C), Min:98.6 F (37 C), Max:100.9 F (38.3 C)  Propofol: none   MAP- >61m84m  UOP- 1650ml50miet Order:   Diet Order             Diet NPO time specified  Diet effective now                  EDUCATION  NEEDS:   No education needs have been identified at this time  Skin:  Skin Assessment: Reviewed RN Assessment (right foot- 1.5cm x 1.0cm x 0.1cm)  Last BM:  11/30- 645ml 83mrectal tube  Height:   Ht Readings from Last 1 Encounters:  02/26/22 5' 2" (1.575 m)    Weight:   Wt Readings from Last 1 Encounters:  03/12/22 69.3 kg    Ideal Body Weight:  50 kg  BMI:  Body mass index is 27.94 kg/m.  Estimated Nutritional Needs:   Kcal:  1700-2000kcal/day  Protein:  90-100g/day  Fluid:  1.5-1.8L/day  Cherrill Scrima Koleen DistanceD, LDN Please refer to [facility] and/or RD on-call/weekend/after hours pager

## 2022-03-12 NOTE — Progress Notes (Signed)
NAME:  [name], MRN:  [id number], DOB:  10-31-1960, LOS: 83 ADMISSION DATE:  02/24/2022, CONSULTATION DATE:  02/25/2022 REFERRING MD:  Dr. Lucky Cowboy, CHIEF COMPLAINT:  Acute Respiratory Distress & hypoxia, development of Hemoptysis post thrombectomy  Brief Pt Description / Synopsis:  61 y.o. female admitted with Acute Hypoxic Respiratory Failure in setting of Bilateral Pulmonary Embolism.  Underwent thrombectomy with Vascular Surgery, post procedure developed Hemoptysis leading to severe blockage of airways requiring emergent intubation. S/p bronch  therapeutic aspiration of blood clots from tracheobronchial tree.  History of Present Illness:  [name] is a 61 year old female with history of hypertension, depression, anxiety, restless leg syndrome, GERD, who presents emergency department for chief concerns of shortness of breath via POV.  Pt is currently intubated and sedation and unable to contribute to history, therefore history is obtained from chart review.   Per ED and nursing notes, she reported that she developed shortness of breath 3 to 4 days ago.  She denies known fever at home and trauma to her person.  She denies chest pain, abdominal pain, dysuria, hematuria.  She did have 1 episode of diarrhea today.  She denies blood in her stool.  She endorses having COVID about 4 weeks ago.   She denies recent international travel or long car trips.  She denies history of cancer that she knows of.  She denies recent surgery.   She denies changes to her leg pain.  She states at baseline she has leg pain because of MS and restless leg syndrome.  ED Course: Initial Vital Signs:  temperature of 99.1, respiration rate of 20, heart rate of 140, blood pressure 124/59, SPO2 of 94% on room air.  Significant Labs: Serum sodium is 138, potassium 2.7, chloride 106, bicarb 23, BUN of 14, serum creatinine 0.62, EGFR greater than 60, nonfasting blood glucose 106, WBC 15.7, hemoglobin  9.3, platelets of 231.  Imaging CTA PE: Was read as acute PE with moderate thrombus burden involving both lungs.  RV-LV ratio is 1.3 suggesting acute or chronic right heart dysfunction.  Small right pleural effusion.  Patchy infiltrates in the perihilar regions of both lung fields, right more than left.  Moderate to large size fixed hiatal hernia.  Bilateral lower extremity venous doppler ultrasounds>>  occlusive thrombus in both peroneal veins.  Medications Administered: Ceftriaxone 2 g IV, vancomycin, LR 150 mL bolus, doxycycline.   Hospitalist were asked to admit for further workup and treatment.  Vascular Surgery was consulted with plan for Thrombectomy.  Please see "significant hospital events" section below for full detailed hospital course.    Pertinent  Medical History   Past Medical History:  Diagnosis Date   GERD (gastroesophageal reflux disease)    Multiple sclerosis (HCC)      Micro Data:  11/14: Blood culture x2>>NGTD 11/15: MRSA PCR>>negative 11/21: Blood x2>>negative  11/22: COVID-19>>positive  11/26: Respiratory panel >>negative  11/26: Resp Culture>>no staph aureus or pseudomonas seen  11/26: MRSA PCR>>negative  11/26: Cdiff>>negative  11/27: Blood x4>>negative   Antimicrobials:   Antibiotics Given (last 72 hours)     Date/Time Action Medication Dose Rate   03/09/22 1112 New Bag/Given   linezolid (ZYVOX) IVPB 600 mg 600 mg 300 mL/hr   03/09/22 1558 New Bag/Given   meropenem (MERREM) 1 g in sodium chloride 0.9 % 100 mL IVPB 1 g 200 mL/hr   03/09/22 2201 New Bag/Given   meropenem (MERREM) 1 g in sodium chloride 0.9 % 100 mL IVPB 1 g 200 mL/hr  03/09/22 2203 New Bag/Given   linezolid (ZYVOX) IVPB 600 mg 600 mg 300 mL/hr   03/10/22 0528 New Bag/Given   meropenem (MERREM) 1 g in sodium chloride 0.9 % 100 mL IVPB 1 g 200 mL/hr   03/10/22 0820 New Bag/Given   linezolid (ZYVOX) IVPB 600 mg 600 mg 300 mL/hr   03/10/22 1355 New Bag/Given   meropenem (MERREM)  1 g in sodium chloride 0.9 % 100 mL IVPB 1 g 200 mL/hr   03/10/22 2104 New Bag/Given   meropenem (MERREM) 1 g in sodium chloride 0.9 % 100 mL IVPB 1 g 200 mL/hr   03/10/22 2226 New Bag/Given   linezolid (ZYVOX) IVPB 600 mg 600 mg 300 mL/hr   03/11/22 0610 New Bag/Given   meropenem (MERREM) 1 g in sodium chloride 0.9 % 100 mL IVPB 1 g 200 mL/hr   03/11/22 1540 New Bag/Given   linezolid (ZYVOX) IVPB 600 mg 600 mg 300 mL/hr   03/11/22 1646 New Bag/Given   meropenem (MERREM) 1 g in sodium chloride 0.9 % 100 mL IVPB 1 g 200 mL/hr   03/11/22 2132 New Bag/Given   meropenem (MERREM) 1 g in sodium chloride 0.9 % 100 mL IVPB 1 g 200 mL/hr   03/11/22 2219 New Bag/Given   linezolid (ZYVOX) IVPB 600 mg 600 mg 300 mL/hr   03/12/22 0549 New Bag/Given   meropenem (MERREM) 1 g in sodium chloride 0.9 % 100 mL IVPB 1 g 200 mL/hr      Significant Hospital Events: Including procedures, antibiotic start and stop dates in addition to other pertinent events   11/14: Admitted by Hospitalist.  Vascular Surgery consulted 11/15:  Thrombectomy performed, massive amounts of clot removed.  Post procedure developed Acute Hemoptysis requiring emergent intubation and transfer to ICU.  PCCM consulted, plan for emergent Bronchoscopy 11/15 3 hour BRONCH to extract clots 11/16  2 units PRBC's to be given.  Repeat Bronch. 11/17: Off pressors. Increasing FiO2 requirements (60% FiO2, 5 peep), CXR with increase in bilateral opacities concerning for PNA.  NO SBT today. Place OG and start feeds. 11/18: Heparin gtt restarted yesterday with no bolus, no hemoptysis reported and Hgb remains stable.  Slow improvement in FiO2 to 60% (from 75%) with diuresis yesterday, renal function remains normal so will diurese again today 11/19: Still with high vent requirements (60% FiO2, 10 PEEP), Heparin gtt d/c due to bmall volume bright red blood from ETT.  Collagen Vascular workup sent.  Holding diuresis due to increased Na+ & BUN.  Solumedrol  increased to 40 mg BID 11/20: Slight improvement in vent requirements (50% FiO2, 8 PEEP).  Worsening Leukocytosis, tracheal aspirate results pending, start Zosyn. 11/21: repeat CT overnight, vomited while in CT. 11/22: no bleeding noted. 11/23: tolerated SBT and sedation holiday - mental status continues to be sluggish 11/24: tolerated SBT and sedation holiday 11/25: tolerated SBT and sedation holiday 11/28: ABNORMAL MRI, REMIANS PVS 11/29: Pt able to nod yes/no and blink on commands and respond to questions appropriately by nodding yes/no; still unable to follow commands on BLU/BLL extremities; will consult PT/OT  11/30: Pt unresponsive and not following commands this am.  No acute events overnight   Interim History / Subjective:  Pts significant other reports following physical therapy on 11/29 pt became extremely lethargic.  He states this morning the pt did respond to him.  However, upon my assessment pt is unresponsive   Objective   Blood pressure (!) 145/75, pulse (!) 135, temperature (!) 100.4 F (38 C),  temperature source Axillary, resp. rate (!) 32, height 5' 2" (1.575 m), weight 69.3 kg, SpO2 97 %.    Vent Mode: PRVC FiO2 (%):  [35 %] 35 % Set Rate:  [16 bmp] 16 bmp Vt Set:  [440 mL] 440 mL PEEP:  [5 cmH20] 5 cmH20 Plateau Pressure:  [18 cmH20] 18 cmH20   Intake/Output Summary (Last 24 hours) at 03/12/2022 0816 Last data filed at 03/12/2022 0600 Gross per 24 hour  Intake 5635.21 ml  Output 2250 ml  Net 3385.21 ml   Filed Weights   03/07/22 0500 03/11/22 0446 03/12/22 0425  Weight: 71.2 kg 70.5 kg 69.3 kg   Examination: General: Acute on chronic ill-appearing female, NAD mechanically intubated  HENT: Atraumatic, normocephalic, neck supple, no JVD present  Lungs: Rhonchi throughout,  present, even, non labored Cardiovascular: Sinus tachycardia, s1s2, no r/g, 2+ radial/2+ distal pulses, no edema  Abdomen: +BS x4, obese, soft, non tender, non distended  Neuro:  Unresponsive, not following commands, PERRL  Extrem: Not spont moving BLU/BLL extremities GU: Indwelling foley catheter in place draining yellow urine   REVIEW OF SYSTEMS PATIENT IS UNABLE TO PROVIDE COMPLETE REVIEW OF SYSTEMS DUE TO [facility] Problem list     Assessment & Plan:   Severe ACUTE Hypoxic Respiratory Failure in setting of Bilateral Pulmonary Embolism, Hemoptysis, and presumed Aspiration  S/p Bronchoscopy x2 - Full vent support, implement lung protective strategies - Plateau pressures less than 30 cm H20 - Wean FiO2 & PEEP as tolerated to maintain O2 sats >92% - Follow intermittent Chest X-ray & ABG as needed - Spontaneous Breathing Trials when respiratory parameters met and mental status permits - Implement VAP Bundle - Bronchodilators & Pulmicort nebs - ABX as above - Aggressive pulmonary toilet as able - Due to failure to wean from ventilator secondary to severe acute metabolic encephalopathy will place ENT consult for tracheostomy   Bilateral Pulmonary Embolism & Bilateral peroneal lower extremity thrombus, suspect in setting of recent COVID-19 infection about 3 weeks prior Hypotension: Related to sedation +/- acute blood loss with hemoptysis +/- sepsis from aspiration/pneumonia~resolved  Sinus tachycardia  Echocardiogram 02/24/22: LVEF 15-17%, normal diastolic parameters, RV size and systolic function normal S/p Thrombectomy 11/15 - Continuous cardiac monitoring - Continue propranolol  - Vascular Surgery following, appreciate input  Presumed VAP vs Aspiration pneumonitis  - Trend WBC and monitor fever curve  - Continue abx as outlined above - ID consulted appreciate input: continue meropenem and linezolid for now   Sedation needs in setting of mechanical ventilation Severe acute metabolic encephalopathy  PMHx: MS  MRI Brain 03/09/22: No acute infarct but does reveal numerous chronic microhemorrhages which are new from the  scan from few months ago - Maintain a RASS goal of 0 - Currently not requiring continuous sedating medications  - Prn dilaudid for pain management  - Neurology consulted appreciate input   Hyperglycemia - CBG's q4h; Target range of 140 to 180 - SSI - Follow ICU Hypo/Hyperglycemia protocol  Best Practice (right click and "Reselect all SmartList Selections" daily)   Diet/type: NPO, Continue TF's DVT prophylaxis: SCD GI prophylaxis: PPI Lines: N/A Foley:  Indwelling foley catheter and still needed  Code Status:  full code  11/30: Pt's significant other updated at bedside. Labs   CBC: Recent Labs  Lab 03/08/22 0841 03/08/22 2105 03/09/22 0220 03/09/22 1943 03/10/22 0621 03/11/22 0421 03/12/22 0331  WBC 36.3* 39.5* 37.6* 38.1* 28.3* 24.7* 25.7*  NEUTROABS 23.9* 25.9*  --  26.3*  --   --  15.3*  HGB 7.5* 7.8* 7.4* 7.7* 7.4* 7.5* 8.9*  HCT 24.5* 25.4* 24.3* 26.0* 24.8* 24.9* 29.1*  MCV 93.2 93.7 93.8 95.6 95.0 93.3 93.9  PLT 189 241 233 308 261 322 299    Basic Metabolic Panel: Recent Labs  Lab 03/08/22 0834 03/09/22 0220 03/10/22 0621 03/11/22 0421 03/12/22 0331  NA 142 141 142 137 139  K 3.1* 3.4* 3.6 3.8 3.8  CL 100 103 109 107 102  CO2 34* _0 GLUCOSE 129* 132* 144* 130* 125*  BUN 18 24* 25* 23* 20  CREATININE 0.34* 0.40* 0.35* 0.40* 0.36*  CALCIUM 8.5* 8.7* 8.6* 8.9 9.0  MG 2.2 2.2 2.2 2.3 2.2  PHOS 1.9* 2.2* 2.7 3.2 3.0   GFR: Estimated Creatinine Clearance: 69.9 mL/min (A) (by C-G formula based on SCr of 0.36 mg/dL (L)). Recent Labs  Lab 03/08/22 0855 03/08/22 2105 03/09/22 1943 03/10/22 0621 03/11/22 0421 03/12/22 0331  PROCALCITON 1.45  --   --   --   --   --   WBC  --    < > 38.1* 28.3* 24.7* 25.7*   < > = values in this interval not displayed.    Liver Function Tests: Recent Labs  Lab 03/08/22 0855  AST 44*  ALT 48*  ALKPHOS 215*  BILITOT 0.9  PROT 5.6*  ALBUMIN 2.4*   No results for input(s): "LIPASE", "AMYLASE" in the  last 168 hours. No results for input(s): "AMMONIA" in the last 168 hours.  ABG    Component Value Date/Time   PHART 7.59 (H) 03/07/2022 0848   PCO2ART 43 03/07/2022 0848   PO2ART 87 03/07/2022 0848   HCO3 41.3 (H) 03/07/2022 0848   ACIDBASEDEF 4.0 (H) 02/26/2022 0307   O2SAT 98.6 03/07/2022 0848     Coagulation Profile: Home Medications  Prior to Admission medications   Medication Sig Start Date End Date Taking? Authorizing Provider  cyanocobalamin 1000 MCG tablet Take by mouth.   Yes [provider]  DULoxetine (CYMBALTA) 30 MG capsule Take 30 mg by mouth daily. 06/25/21  Yes [provider]  gabapentin (NEURONTIN) 400 MG capsule Take Gabapentin 400 mg in the morning, 400 mg in the afternoon, and 800 mg at night 12/29/21  Yes [provider]  omeprazole (PRILOSEC) 20 MG capsule Take 20 mg by mouth daily. 06/10/21  Yes [provider]  propranolol (INDERAL) 20 MG tablet Take 1 tablet by mouth 2 (two) times daily. 12/16/21  Yes [provider]  rOPINIRole (REQUIP) 0.25 MG tablet Take 0.25 mg at night for 1 week, then increase to 0.5 mg at night 05/29/21  Yes [provider]  tolterodine (DETROL LA) 2 MG 24 hr capsule Take 2 mg by mouth 2 (two) times daily. 12/29/21 12/29/22 Yes [provider]  ascorbic acid (VITAMIN C) 500 MG tablet Take by mouth.    [provider]  Cholecalciferol 50 MCG (2000 UT) CAPS Take by mouth.    [provider]  cyclobenzaprine (FLEXERIL) 10 MG tablet Take 10 mg by mouth 2 (two) times daily as needed. 11/26/21   [provider]  dalfampridine 10 MG TB12 Take 1 tablet by mouth every 12 (twelve) hours. 12/29/21   [provider]  meloxicam (MOBIC) 15 MG tablet Take 1 tablet (15 mg total) by mouth daily. 08/23/21 08/23/22  Cuthriell, Charline Bills, PA-C  methylPREDNISolone (MEDROL DOSEPAK) 4 MG TBPK tablet Take by mouth. 11/26/21   [provider]  naproxen (NAPROSYN) 500  MG tablet Take by mouth.    [provider]  nitrofurantoin, macrocrystal-monohydrate, (MACROBID) 100 MG capsule Take 1 capsule (100 mg total) by mouth 2 (two) times daily. 01/19/22   Immordino, Annie Main, FNP  pregabalin (LYRICA) 75 MG capsule Take 75 mg by mouth 3 (three) times daily. 08/18/21   [provider]  traMADol (ULTRAM) 50 MG tablet Take 50 mg by mouth 3 (three) times daily as needed. 12/26/21   [provider]      Critical Care Time: 40 minutes   Donell Beers, Raymond Pager [phone] (please enter 7 digits) PCCM Consult Pager [phone] (please enter 7 digits)

## 2022-03-12 NOTE — Progress Notes (Signed)
PHARMACY CONSULT NOTE  Pharmacy Consult for Electrolyte Monitoring and Replacement   Recent Labs: Potassium (mmol/L)  Date Value  03/12/2022 3.8   Magnesium (mg/dL)  Date Value  66/29/4765 2.2   Calcium (mg/dL)  Date Value  46/50/3546 9.0   Albumin (g/dL)  Date Value  56/81/2751 2.4 (L)   Phosphorus (mg/dL)  Date Value  70/04/7492 3.0   Sodium (mmol/L)  Date Value  03/12/2022 139   Assessment: 61 yo female presented to ED with SOB when lying flat and some right sided rib and shoulder pain.  Chest CT showed acute PE.  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 Pharmacy is asked to follow and replace electrolytes while in CCU.  Nutrition: Tube feeds at 58ml/h, FWF 100 q6h  Goal of Therapy:  Electrolytes within normal limits  Plan:  --No electrolyte replacement indicated at this time --Defer ordering of labs to primary team. Will follow along and replace where needed  Tressie Ellis 03/12/2022 7:47 AM

## 2022-03-12 NOTE — Progress Notes (Signed)
ID Remains intubated But more responsive and nods to questions- yes or No Fever today of 100.9 Patient Vitals for the past 24 hrs:  BP Temp Temp src Pulse Resp SpO2 Weight  03/12/22 1329 -- -- -- (!) 120 (!) 32 97 % --  03/12/22 1300 135/74 -- -- (!) 119 (!) 32 97 % --  03/12/22 1200 132/72 100.2 F (37.9 C) Axillary -- (!) 32 -- --  03/12/22 1118 -- -- -- -- -- 95 % --  03/12/22 1100 (!) 141/69 -- -- -- (!) 33 -- --  03/12/22 1000 (!) 140/58 -- -- -- (!) 26 -- --  03/12/22 0900 (!) 141/69 -- -- (!) 149 (!) 33 95 % --  03/12/22 0841 -- -- -- -- -- 94 % --  03/12/22 0800 139/68 (!) 100.9 F (38.3 C) Axillary -- (!) 23 -- --  03/12/22 0500 (!) 145/75 -- -- (!) 135 (!) 32 97 % --  03/12/22 0425 -- -- -- -- -- -- 69.3 kg  03/12/22 0400 (!) 146/81 (!) 100.4 F (38 C) Axillary (!) 133 20 98 % --  03/12/22 0337 -- -- -- -- -- 98 % --  03/12/22 0000 134/77 98.8 F (37.1 C) Oral (!) 111 (!) 30 95 % --  03/11/22 2308 -- -- -- -- -- 95 % --  03/11/22 2300 136/75 -- -- (!) 108 (!) 29 96 % --  03/11/22 2200 138/72 -- -- -- (!) 28 -- --  03/11/22 2100 (!) 140/73 -- -- -- (!) 23 -- --  03/11/22 2020 -- -- -- -- -- 90 % --  03/11/22 2000 (!) 142/78 98.6 F (37 C) Oral (!) 132 (!) 25 94 % --  03/11/22 1900 (!) 140/73 -- -- (!) 130 (!) 29 95 % --  03/11/22 1648 -- 99.4 F (37.4 C) Oral -- -- -- --  03/11/22 1600 (!) 145/79 -- -- -- (!) 30 100 % --  03/11/22 1543 -- 100.3 F (37.9 C) Oral (!) 131 (!) 32 93 % --  03/11/22 1520 -- -- -- (!) 131 (!) 32 95 % --    Chest b/l air entry- crepts Abd soft Hs s1s2-Tachycardia Cns cannot be assessed  Labs    Latest Ref Rng & Units 03/12/2022    3:31 AM 03/11/2022    4:21 AM 03/10/2022    6:21 AM  CBC  WBC 4.0 - 10.5 K/uL 25.7  24.7  28.3   Hemoglobin 12.0 - 15.0 g/dL 8.9  7.5  7.4   Hematocrit 36.0 - 46.0 % 29.1  24.9  24.8   Platelets 150 - 400 K/uL 303  322  261        Latest Ref Rng & Units 03/12/2022    3:31 AM 03/11/2022    4:21 AM  03/10/2022    6:21 AM  CMP  Glucose 70 - 99 mg/dL 125  130  144   BUN 6 - 20 mg/dL 20  23  25   Creatinine 0.44 - 1.00 mg/dL 0.36  0.40  0.35   Sodium 135 - 145 mmol/L 139  137  142   Potassium 3.5 - 5.1 mmol/L 3.8  3.8  3.6   Chloride 98 - 111 mmol/L 102  107  109   CO2 22 - 32 mmol/L 28  24  27   Calcium 8.9 - 10.3 mg/dL 9.0  8.9  8.6     Micto 11/21 BC / tracheal culture NG 11/26 tracheal culture neg 11/27 BC -   NG Cdiff neg   Impression/recommendation  61 yr female with h/o MS wheel chair bound presented with SOB and rt sided chest pain with recent covid infection   Acute PE b/l with mod- severe clot burden with strain Underwent thrombectomy Complicated by severe pulmonary hemorrhage requiring bronchoscopy to clear the clots on 02/25/22 Ongoing fever and leucocytosis- had not  responded to antibiotics-until it was changed to  meropenem and linezolid- was afebrile \for 24 hrs  but started fever again today and also leucocytosis fluctuating,. Will not stop prematurely the antibiotics even if cultures are negative- will Continue meropenem and linezolid X 4 more days  Was the fever due to pulmonary infarct? ?? ARDS ( as per pulmonologist her resp status improving ? Aspiration pneumonitis   rule out acute myelomonocytic leukemia causing fever - peripheral smear shows blasts 1-2%, monocytosis- heme onc consult, bone marrow biopsy( a patient's son died of acute leukemia in 2020 age 30 yrs)   Discussed the management with care team 

## 2022-03-13 ENCOUNTER — Inpatient Hospital Stay: Payer: Medicare Other

## 2022-03-13 DIAGNOSIS — D72829 Elevated white blood cell count, unspecified: Secondary | ICD-10-CM

## 2022-03-13 DIAGNOSIS — R0489 Hemorrhage from other sites in respiratory passages: Secondary | ICD-10-CM | POA: Diagnosis not present

## 2022-03-13 DIAGNOSIS — G35 Multiple sclerosis: Secondary | ICD-10-CM | POA: Diagnosis not present

## 2022-03-13 DIAGNOSIS — D72828 Other elevated white blood cell count: Secondary | ICD-10-CM | POA: Diagnosis not present

## 2022-03-13 DIAGNOSIS — R509 Fever, unspecified: Secondary | ICD-10-CM | POA: Diagnosis not present

## 2022-03-13 DIAGNOSIS — J9601 Acute respiratory failure with hypoxia: Secondary | ICD-10-CM | POA: Diagnosis not present

## 2022-03-13 DIAGNOSIS — I2699 Other pulmonary embolism without acute cor pulmonale: Secondary | ICD-10-CM | POA: Diagnosis not present

## 2022-03-13 DIAGNOSIS — A419 Sepsis, unspecified organism: Secondary | ICD-10-CM | POA: Diagnosis not present

## 2022-03-13 LAB — CBC WITH DIFFERENTIAL/PLATELET
Abs Immature Granulocytes: 3.25 10*3/uL — ABNORMAL HIGH (ref 0.00–0.07)
Basophils Absolute: 0.2 10*3/uL — ABNORMAL HIGH (ref 0.0–0.1)
Basophils Relative: 1 %
Eosinophils Absolute: 0 10*3/uL (ref 0.0–0.5)
Eosinophils Relative: 0 %
HCT: 29.1 % — ABNORMAL LOW (ref 36.0–46.0)
Hemoglobin: 8.8 g/dL — ABNORMAL LOW (ref 12.0–15.0)
Immature Granulocytes: 13 %
Lymphocytes Relative: 3 %
Lymphs Abs: 0.9 10*3/uL (ref 0.7–4.0)
MCH: 28.6 pg (ref 26.0–34.0)
MCHC: 30.2 g/dL (ref 30.0–36.0)
MCV: 94.5 fL (ref 80.0–100.0)
Monocytes Absolute: 5.6 10*3/uL — ABNORMAL HIGH (ref 0.1–1.0)
Monocytes Relative: 22 %
Neutro Abs: 15.7 10*3/uL — ABNORMAL HIGH (ref 1.7–7.7)
Neutrophils Relative %: 61 %
Platelets: 273 10*3/uL (ref 150–400)
RBC: 3.08 MIL/uL — ABNORMAL LOW (ref 3.87–5.11)
RDW: 19.8 % — ABNORMAL HIGH (ref 11.5–15.5)
Smear Review: NORMAL
WBC: 25.7 10*3/uL — ABNORMAL HIGH (ref 4.0–10.5)
nRBC: 0 % (ref 0.0–0.2)

## 2022-03-13 LAB — BASIC METABOLIC PANEL
Anion gap: 8 (ref 5–15)
BUN: 21 mg/dL — ABNORMAL HIGH (ref 6–20)
CO2: 29 mmol/L (ref 22–32)
Calcium: 8.7 mg/dL — ABNORMAL LOW (ref 8.9–10.3)
Chloride: 101 mmol/L (ref 98–111)
Creatinine, Ser: <0.3 mg/dL — ABNORMAL LOW (ref 0.44–1.00)
Glucose, Bld: 123 mg/dL — ABNORMAL HIGH (ref 70–99)
Potassium: 3.6 mmol/L (ref 3.5–5.1)
Sodium: 138 mmol/L (ref 135–145)

## 2022-03-13 LAB — RETIC PANEL
Immature Retic Fract: 25.6 % — ABNORMAL HIGH (ref 2.3–15.9)
RBC.: 2.92 MIL/uL — ABNORMAL LOW (ref 3.87–5.11)
Retic Count, Absolute: 190.9 10*3/uL — ABNORMAL HIGH (ref 19.0–186.0)
Retic Ct Pct: 6.7 % — ABNORMAL HIGH (ref 0.4–3.1)
Reticulocyte Hemoglobin: 29.5 pg (ref >27.9)

## 2022-03-13 LAB — FERRITIN: Ferritin: 886 ng/mL — ABNORMAL HIGH (ref 11–307)

## 2022-03-13 LAB — IRON AND TIBC
Iron: 15 ug/dL — ABNORMAL LOW (ref 28–170)
Saturation Ratios: 5 % — ABNORMAL LOW (ref 10.4–31.8)
TIBC: 294 ug/dL (ref 250–450)
UIBC: 279 ug/dL

## 2022-03-13 LAB — PHOSPHORUS: Phosphorus: 2.2 mg/dL — ABNORMAL LOW (ref 2.5–4.6)

## 2022-03-13 LAB — GLUCOSE, CAPILLARY
Glucose-Capillary: 141 mg/dL — ABNORMAL HIGH (ref 70–99)
Glucose-Capillary: 130 mg/dL — ABNORMAL HIGH (ref 70–99)
Glucose-Capillary: 140 mg/dL — ABNORMAL HIGH (ref 70–99)
Glucose-Capillary: 132 mg/dL — ABNORMAL HIGH (ref 70–99)
Glucose-Capillary: 149 mg/dL — ABNORMAL HIGH (ref 70–99)
Glucose-Capillary: 144 mg/dL — ABNORMAL HIGH (ref 70–99)

## 2022-03-13 LAB — PROTIME-INR
INR: 1.2 (ref 0.8–1.2)
Prothrombin Time: 15.2 seconds (ref 11.4–15.2)

## 2022-03-13 LAB — URIC ACID: Uric Acid, Serum: <1.5 mg/dL — ABNORMAL LOW (ref 2.5–7.1)

## 2022-03-13 LAB — HEPARIN LEVEL (UNFRACTIONATED): Heparin Unfractionated: 0.16 IU/mL — ABNORMAL LOW (ref 0.30–0.70)

## 2022-03-13 LAB — MAGNESIUM: Magnesium: 2.2 mg/dL (ref 1.7–2.4)

## 2022-03-13 LAB — LACTATE DEHYDROGENASE: LDH: 284 U/L — ABNORMAL HIGH (ref 98–192)

## 2022-03-13 LAB — APTT: aPTT: 41 seconds — ABNORMAL HIGH (ref 24–36)

## 2022-03-13 MED ORDER — ARFORMOTEROL TARTRATE 15 MCG/2ML IN NEBU
15.0000 ug | INHALATION_SOLUTION | Freq: Two times a day (BID) | RESPIRATORY_TRACT | Status: DC
  Administered 2022-03-13 – 2022-03-20 (×13): 15 ug via RESPIRATORY_TRACT
  Filled 2022-03-13 (×15): qty 2

## 2022-03-13 MED ORDER — REVEFENACIN 175 MCG/3ML IN SOLN
175.0000 ug | Freq: Every day | RESPIRATORY_TRACT | Status: DC
  Administered 2022-03-13 – 2022-03-20 (×8): 175 ug via RESPIRATORY_TRACT
  Filled 2022-03-13 (×8): qty 3

## 2022-03-13 MED ORDER — METOPROLOL TARTRATE 5 MG/5ML IV SOLN
5.0000 mg | Freq: Three times a day (TID) | INTRAVENOUS | Status: DC | PRN
  Administered 2022-03-13 – 2022-03-14 (×2): 5 mg via INTRAVENOUS
  Filled 2022-03-13 (×2): qty 5

## 2022-03-13 MED ORDER — DEXMEDETOMIDINE HCL IN NACL 400 MCG/100ML IV SOLN
0.4000 ug/kg/h | INTRAVENOUS | Status: DC
  Administered 2022-03-13 – 2022-03-14 (×2): 0.4 ug/kg/h via INTRAVENOUS
  Filled 2022-03-13 (×2): qty 100

## 2022-03-13 MED ORDER — METOPROLOL TARTRATE 5 MG/5ML IV SOLN
2.5000 mg | INTRAVENOUS | Status: AC
  Administered 2022-03-13: 2.5 mg via INTRAVENOUS
  Filled 2022-03-13: qty 5

## 2022-03-13 MED ORDER — POTASSIUM & SODIUM PHOSPHATES 280-160-250 MG PO PACK
1.0000 | PACK | Freq: Three times a day (TID) | ORAL | Status: AC
  Administered 2022-03-13 (×3): 1
  Filled 2022-03-13 (×3): qty 1

## 2022-03-13 MED ORDER — HEPARIN (PORCINE) 25000 UT/250ML-% IV SOLN
1400.0000 [IU]/h | INTRAVENOUS | Status: DC
  Administered 2022-03-13: 1150 [IU]/h via INTRAVENOUS
  Administered 2022-03-14 – 2022-03-15 (×2): 1400 [IU]/h via INTRAVENOUS
  Filled 2022-03-13 (×3): qty 250

## 2022-03-13 MED ORDER — METOPROLOL TARTRATE 25 MG PO TABS
25.0000 mg | ORAL_TABLET | Freq: Four times a day (QID) | ORAL | Status: DC
  Administered 2022-03-13 – 2022-03-20 (×27): 25 mg
  Filled 2022-03-13 (×28): qty 1

## 2022-03-13 MED ORDER — METOPROLOL TARTRATE 25 MG PO TABS
25.0000 mg | ORAL_TABLET | Freq: Two times a day (BID) | ORAL | Status: DC
  Administered 2022-03-13: 25 mg
  Filled 2022-03-13: qty 1

## 2022-03-13 NOTE — Consult Note (Addendum)
Hematology/Oncology Consult note [phone] [phone]      Patient Care Team: [place] as PCP - General   Name of the patient: [name]  [id number]  1960-08-07   REASON FOR COSULTATION:  Leukocytosis, monocytosis History of presenting illness-  61 y.o. female with PMH listed at below who is currently admitted in ICU due to acute respiratory failure. Patient is currently intubated and not able to provide any medical History.  Extensive medical records review was performed by me. 02/24/2022, patient initially presented emergency room due to shortness of breath.  She has had COVID-19 infection recently.  CT angiogram PE protocol showed acute PE with moderate thrombus burden involving both lungs.  There was radiographic evidence of acute on chronic right heart dysfunction.  Small right effusion.  Patchy infiltrates in the perihilar region of both lung fields.  Patient was admitted and started on anticoagulation.  Vascular surgery was consulted and recommended mechanical thrombectomy. 02/25/2022, patient underwent mechanical thrombectomy by Dr. Lucky Cowboy. Postprocedure, patient developed acute hemoptysis, respiratory distress and hypoxia.  Patient was intubated and admitted to ICU.  Patient had prolonged hospitalization course.  She has required blood transfusion due to acute blood loss anemia on 02/26/2022, initially she required vasopressor.  Patient also developed a fever and progressively worsening of leukocytosis.  Respiratory panel negative.  Blood culture negative. ID was consulted for evaluation of progressive worsening of leukocytosis. Patient has been treated with empiric antibiotics with no improvement of leukocytosis.  upon her presentation to emergency room, she has a white count of 15, no differentiation was obtained at that point..  Predominantly neutrophilia and monocytosis.  Her most recent CBC was 6 months ago with a normal white  count of 5.4 Earlier CBC in 2022 showed chronic lymphocytopenia, increased monocyte percentage, patient has MS, follows up with neurology and has been on Ocrevus for treatments.  During her current hospitalization, white count progressively increased to 25, predominantly neutrophilia, monocytosis, mild increase of basophils.  A peripheral smear done on 03/03/2022 showed Marked leukocytosis, with left shifted maturation, and monocytosis.  Moderate granulocytic atypia, with nuclear hypolobation and reduced cytoplasmic granularity. Few circulating blasts present, estimated at 1 to 2% of total WBC.  Normocytic anemia, with moderate anisocytosis, including anisocytosis and nucleated RBCs. Thrombocytopenia, with few giant platelets.   Hematology oncology was consulted today for further evaluation of patient's leukocytosis. So far, no obvious infectious source was found and the patient's white count has not responded to antibiotics.  Allergies  Allergen Reactions   Erythromycin Hives and Nausea And Vomiting   Lexapro [Escitalopram Oxalate] Hives    Patient Active Problem List   Diagnosis Date Noted   Sepsis ([place]) 03/02/2022   Acute hypoxic respiratory failure ([place]) 03/02/2022   HAP (hospital-acquired pneumonia) 03/02/2022   Pulmonary hemorrhage 03/02/2022   Bilateral pulmonary embolism ([place]) 02/24/2022   Tachycardia 02/24/2022   Multiple sclerosis ([place]) 06/26/2021   Abdominal pain, LUQ (left upper quadrant) 06/10/2021   Chronic constipation 06/10/2021   Family history of pancreatic cancer 06/10/2021   Gastroesophageal reflux disease 06/10/2021   Low back pain 12/06/2020   Numbness 09/26/2020   Falls frequently 04/18/2020   Right leg pain 04/18/2020   RLS (restless legs syndrome) 04/18/2020   History of multiple sclerosis ([place]) 02/22/2020     Past Medical History:  Diagnosis Date   GERD (gastroesophageal reflux disease)    Multiple sclerosis ([place])      Past Surgical History:   Procedure Laterality Date   ESOPHAGOGASTRODUODENOSCOPY (  EGD) WITH PROPOFOL N/A 09/11/2021   Procedure: ESOPHAGOGASTRODUODENOSCOPY (EGD) WITH PROPOFOL;  Surgeon: Annamaria Helling, DO;  Location: [facility] ENDOSCOPY;  Service: Gastroenterology;  Laterality: N/A;   PULMONARY THROMBECTOMY Bilateral 02/25/2022   Procedure: PULMONARY THROMBECTOMY;  Surgeon: Algernon Huxley, MD;  Location: [facility] LAB;  Service: Cardiovascular;  Laterality: Bilateral;   TUBAL LIGATION      Social History   Socioeconomic History   Marital status: Widowed    Spouse name: Not on file   Number of children: Not on file   Years of education: Not on file   Highest education level: Not on file  Occupational History   Not on file  Tobacco Use   Smoking status: Never   Smokeless tobacco: Never  Vaping Use   Vaping Use: Never used  Substance and Sexual Activity   Alcohol use: Yes    Comment: Social   Drug use: Never   Sexual activity: Not Currently  Other Topics Concern   Not on file  Social History Narrative   Not on file   Social Determinants of Health   Financial Resource Strain: Not on file  Food Insecurity: Not on file  Transportation Needs: Not on file  Physical Activity: Not on file  Stress: Not on file  Social Connections: Not on file  Intimate Partner Violence: Not on file     History reviewed. No pertinent family history.   Current Facility-Administered Medications:    0.9 %  sodium chloride infusion, 250 mL, Intravenous, Continuous, Flora Lipps, MD, Stopped at 03/09/22 0212   acetaminophen (TYLENOL) tablet 650 mg, 650 mg, Per Tube, Q6H PRN, Rust-Chester, Britton L, NP, 650 mg at 03/13/22 0856   arformoterol (BROVANA) nebulizer solution 15 mcg, 15 mcg, Nebulization, BID, Chawla, Harsh, MD   Chlorhexidine Gluconate Cloth 2 % PADS 6 each, 6 each, Topical, Daily, Dew, Erskine Squibb, MD, 6 each at 03/12/22 1600   cyanocobalamin (VITAMIN B12) tablet 1,000 mcg, 1,000 mcg, Per Tube, Daily,  Dallie Piles, RPH, 1,000 mcg at 03/13/22 0856   enoxaparin (LOVENOX) injection 40 mg, 40 mg, Subcutaneous, Q24H, Teressa Lower, NP, 40 mg at 03/12/22 1723   feeding supplement (OSMOLITE 1.5 CAL) liquid 1,000 mL, 1,000 mL, Per Tube, Continuous, Flora Lipps, MD, Held at 03/12/22 1756   feeding supplement (PROSource TF20) liquid 60 mL, 60 mL, Per Tube, Daily, Chawla, Harsh, MD, 60 mL at 03/13/22 0856   fiber (NUTRISOURCE FIBER) 1 packet, 1 packet, Per Tube, BID, Flora Lipps, MD, 1 packet at 03/13/22 1100   free water 100 mL, 100 mL, Per Tube, Q6H, [place], Bing Neighbors, NP, 100 mL at 03/13/22 1200   HYDROmorphone (DILAUDID) injection 1 mg, 1 mg, Intravenous, Q2H PRN, Dgayli, Khabib, MD, 1 mg at 03/08/22 0019   insulin aspart (novoLOG) injection 0-15 Units, 0-15 Units, Subcutaneous, Q4H, Rust-Chester, Britton L, NP, 2 Units at 03/13/22 1330   linezolid (ZYVOX) IVPB 600 mg, 600 mg, Intravenous, Q12H, Ravishankar, Jayashree, MD, Stopped at 03/13/22 1201   meropenem (MERREM) 1 g in sodium chloride 0.9 % 100 mL IVPB, 1 g, Intravenous, Q8H, Ravishankar, Jayashree, MD, Last Rate: 200 mL/hr at 03/13/22 1400, Infusion Verify at 03/13/22 1400   metoprolol tartrate (LOPRESSOR) tablet 25 mg, 25 mg, Per Tube, BID, Chawla, Harsh, MD, 25 mg at 03/13/22 0848   nutrition supplement (JUVEN) (JUVEN) powder packet 1 packet, 1 packet, Per Tube, BID BM, Chawla, Harsh, MD, 1 packet at 03/13/22 1330   Oral care mouth rinse, 15 mL,  Mouth Rinse, Q2H, Chawla, Harsh, MD, 15 mL at 03/13/22 1330   Oral care mouth rinse, 15 mL, Mouth Rinse, PRN, Chawla, Harsh, MD   pantoprazole (PROTONIX) injection 40 mg, 40 mg, Intravenous, Q24H, Dgayli, Khabib, MD, 40 mg at 03/13/22 0852   potassium & sodium phosphates (PHOS-NAK) 280-160-250 MG packet 1 packet, 1 packet, Per Tube, TID WC & HS, Benita Gutter, RPH, 1 packet at 03/13/22 1100   revefenacin (YUPELRI) nebulizer solution 175 mcg, 175 mcg, Nebulization, Daily, Chawla, Harsh, MD,  175 mcg at 03/13/22 1322  Review of Systems  Unable to perform ROS: Intubated    PHYSICAL EXAM Vitals:   03/13/22 1000 03/13/22 1100 03/13/22 1200 03/13/22 1300  BP: 105/61 108/63 116/66 130/67  Pulse: (!) 110 (!) 115 (!) 125 (!) 134  Resp: (!) 35 (!) 23 (!) 25 (!) 31  Temp:      TempSrc:      SpO2: 98% 96% 97% 97%  Weight:      Height:       Physical Exam HENT:     Head: Normocephalic and atraumatic.     Mouth/Throat:     Comments: ET tube in place Eyes:     General: No scleral icterus. Cardiovascular:     Rate and Rhythm: Normal rate.     Pulses: Normal pulses.  Pulmonary:     Comments: Intubated Abdominal:     General: There is no distension.     Palpations: Abdomen is soft.  Musculoskeletal:        General: Normal range of motion.     Cervical back: Neck supple.  Skin:    General: Skin is warm.       LABORATORY STUDIES    Latest Ref Rng & Units 03/13/2022    4:54 AM 03/12/2022    3:31 AM 03/11/2022    4:21 AM  CBC  WBC 4.0 - 10.5 K/uL 25.7  25.7  24.7   Hemoglobin 12.0 - 15.0 g/dL 8.8  8.9  7.5   Hematocrit 36.0 - 46.0 % 29.1  29.1  24.9   Platelets 150 - 400 K/uL 273  303  322       Latest Ref Rng & Units 03/13/2022    4:54 AM 03/12/2022    3:31 AM 03/11/2022    4:21 AM  CMP  Glucose 70 - 99 mg/dL 123  125  130   BUN 6 - 20 mg/dL _0 Creatinine 0.44 - 1.00 mg/dL <0.30  0.36  0.40   Sodium 135 - 145 mmol/L 138  139  137   Potassium 3.5 - 5.1 mmol/L 3.6  3.8  3.8   Chloride 98 - 111 mmol/L 101  102  107   CO2 22 - 32 mmol/L _1 Calcium 8.9 - 10.3 mg/dL 8.7  9.0  8.9      RADIOGRAPHIC STUDIES: I have personally reviewed the radiological images as listed and agreed with the findings in the report. US Venous Img Lower Bilateral (DVT)  Result Date: 03/13/2022 CLINICAL DATA:  Pulmonary embolism EXAM: BILATERAL LOWER EXTREMITY VENOUS DOPPLER ULTRASOUND TECHNIQUE: Gray-scale sonography with graded compression, as well as color  Doppler and duplex ultrasound were performed to evaluate the lower extremity deep venous systems from the level of the common femoral vein and including the common femoral, femoral, profunda femoral, popliteal and calf veins including the posterior tibial, peroneal and gastrocnemius veins when visible. The superficial great saphenous  vein was also interrogated. Spectral Doppler was utilized to evaluate flow at rest and with distal augmentation maneuvers in the common femoral, femoral and popliteal veins. COMPARISON:  CTA PE and CT AP, 03/03/2022. FINDINGS: RIGHT LOWER EXTREMITY VENOUS Normal compressibility of the RIGHT common femoral, superficial femoral, and popliteal veins. Visualized portions of profunda femoral vein and great saphenous vein unremarkable. Non compressibility with filling defect within the imaged RIGHT peroneal vein. OTHER No evidence of superficial thrombophlebitis or abnormal fluid collection. Limitations: Patient body habitus LEFT LOWER EXTREMITY VENOUS Normal compressibility of the LEFT common femoral vein. Homogeneously echogenic, occlusive and non-compressible filling defect within the imaged portions of the LEFT profunda, SFV, popliteal and imaged portions of the LEFT calf veins. OTHER No evidence of superficial thrombophlebitis or abnormal fluid collection. Limitations: Patient body habitus IMPRESSION: Examination is POSITIVE for bilateral lower extremity DVT, greater on LEFT with femoropopliteal involvement and within the RIGHT peroneal vein. Michaelle Birks, MD [facility] Electronically Signed   By: Michaelle Birks M.D.   On: 03/13/2022 10:10   MR BRAIN W WO CONTRAST  Result Date: 03/09/2022 CLINICAL DATA:  Mental status change, unknown cause. Acute metabolic encephalopathy. History of COVID and pulmonary embolism requiring thrombectomy on 02/25/2022. History of multiple sclerosis. EXAM: MRI HEAD WITHOUT AND WITH CONTRAST  TECHNIQUE: Multiplanar, multiecho pulse sequences of the brain and surrounding structures were obtained without and with intravenous contrast. CONTRAST:  68m GADAVIST GADOBUTROL 1 MMOL/ML IV SOLN COMPARISON:  Head CT 03/07/2022 and MRI 09/23/2021 FINDINGS: Some sequences are mildly to moderately motion degraded. Brain: There is no evidence of an acute infarct, intracranial hemorrhage, mass, midline shift, or extra-axial fluid collection. There is mild cerebral atrophy. There are there are multiple new foci of chronic microhemorrhage scattered throughout both cerebral hemispheres (including in the splenium of the corpus callosum), and there are also multiple new small foci of chronic hemorrhage in both cerebellar hemispheres. Punctate foci of susceptibility are also noted in the occipital horns of the lateral ventricles. There is no associated enhancement. Numerous foci of T2 FLAIR hyperintensity in the juxtacortical, deep, and periventricular white matter bilaterally have not appreciably changed from the prior MRI. Small lesions in the midbrain, pons, and possibly right cerebellum are also grossly unchanged within limitations of motion artifact. Multiple black holes are noted on T1 weighted images. No lesions demonstrate restricted diffusion or enhancement. Vascular: Major intracranial vascular flow voids are preserved. Skull and upper cervical spine: Unremarkable bone marrow signal. Sinuses/Orbits: Unremarkable orbits. Prominent mucosal thickening in the right maxillary sinus. Moderate left mastoid effusion. Other: None. IMPRESSION: 1. Motion degraded examination.  No acute infarct. 2. Numerous foci of chronic cerebral and cerebellar microhemorrhage which are new from 09/23/2021, of indeterminate etiology. Considerations include emboli, sequelae of COVID infection or other critical illness, and vasculitis. There are also trace chronic blood products or other debris in the occipital horns of the lateral ventricles.  3. Unchanged white matter disease consistent with multiple sclerosis. No evidence of active demyelination. Electronically Signed   By: ALogan BoresM.D.   On: 03/09/2022 16:26   EEG adult  Result Date: 03/09/2022 YLora Havens MD     03/09/2022  2:24 PM Patient Name: MIdalys KonecnyMRN: [id number] Attending: PLora HavensReferring Physician/Provider: KBradly Bienenstock NP Date: 03/09/2022 Duration: 27.49 mins Patient history: 61year old female with altered mental status.  EEG to evaluate for seizure. Level of alertness: Awake, asleep AEDs during EEG study: None Technical aspects: This EEG  study was done with scalp electrodes positioned according to the 10-20 International system of electrode placement. Electrical activity was reviewed with band pass filter of 1-_0 , sensitivity of 7 uV/mm, display speed of 62m/sec with a _1  notched filter applied as appropriate. EEG data were recorded continuously and digitally stored.  Video monitoring was available and reviewed as appropriate. Description: The posterior dominant rhythm consists of 8-9 Hz activity of moderate voltage (25-35 uV) seen predominantly in posterior head regions, symmetric and reactive to eye opening and eye closing. Sleep was characterized by vertex waves, sleep spindles (12 to 14 Hz), maximal frontocentral region. EEG showed intermittent generalized 3 to 6 Hz theta-delta slowing. Hyperventilation and photic stimulation were not performed.   ABNORMALITY - Intermittent slow, generalized IMPRESSION: This study is suggestive of mild diffuse encephalopathy, nonspecific etiology. No seizures or epileptiform discharges were seen throughout the recording. Priyanka O Yadav   [place]NAL  Result Date: 03/08/2022 CLINICAL DATA:  Fever EXAM: RENAL / URINARY TRACT ULTRASOUND COMPLETE COMPARISON:  None Available. FINDINGS: Right Kidney: Renal measurements: 9.9 x 4.5 x 5.3 cm = volume: 121.4 mL. Echogenicity within normal limits. No  mass or hydronephrosis visualized. Left Kidney: Renal measurements: 10.1 x 5.2 x 5.6 cm = volume: 153.1 mL. Echogenicity within normal limits. No mass or hydronephrosis visualized. Bladder: Urinary bladder is collapsed about the Foley's catheter. Other: None. IMPRESSION: 1. No hydronephrosis. 2. Urinary bladder is collapsed about the Foley's catheter. Electronically Signed   By: IKeane PoliceD.O.   On: 03/08/2022 21:33   DG Chest Port 1 View  Result Date: 03/08/2022 CLINICAL DATA:  Chronic dyspnea.  History of bilateral PEs. EXAM: PORTABLE CHEST 1 VIEW COMPARISON:  03/06/2022 FINDINGS: Tracheostomy tube tip is stable above the carina. There is an enteric tube with tip and side port below the GE junction. Stable cardiomediastinal contours. Bilateral interstitial and airspace opacities are again noted, not significantly improved from previous exam. IMPRESSION: 1. No significant change in aeration to the lungs compared with previous exam. 2. Stable support apparatus. Electronically Signed   By: TKerby MoorsM.D.   On: 03/08/2022 09:05   CT HEAD WO CONTRAST (5MM)  Result Date: 03/07/2022 CLINICAL DATA:  Altered mental status EXAM: CT HEAD WITHOUT CONTRAST TECHNIQUE: Contiguous axial images were obtained from the base of the skull through the vertex without intravenous contrast. RADIATION DOSE REDUCTION: This exam was performed according to the departmental dose-optimization program which includes automated exposure control, adjustment of the mA and/or kV according to patient size and/or use of iterative reconstruction technique. COMPARISON:  Brain MRI 09/23/2021 FINDINGS: Brain: There is no acute intracranial hemorrhage, extra-axial fluid collection, or acute infarct. Parenchymal volume is normal. The ventricles are normal in size. Gray-white differentiation is preserved. Hypodense lesions in the supratentorial white matter consistent with the history of multiple sclerosis are noted but better seen on prior  MRI. There is no mass lesion.  There is no mass effect or midline shift. Vascular: No hyperdense vessel or unexpected calcification. Skull: Normal. Negative for fracture or focal lesion. Sinuses/Orbits: There is opacification of the imaged portion of the right maxillary sinus with a small amount of layering fluid in the imaged left maxillary sinus. There is a dysconjugate gaze. The globes and orbits are otherwise unremarkable. Other: There is a left mastoid effusion. IMPRESSION: 1. No acute intracranial pathology. 2. Dysconjugate gaze. 3. Hypodense lesions in the supratentorial white matter consistent with multiple sclerosis, better seen on prior MRI. 4. Paranasal sinus disease with layering fluid in  the imaged left maxillary sinus which could reflect acute sinusitis in the correct clinical setting. 5. Left mastoid effusion. Electronically Signed   By: Valetta Mole M.D.   On: 03/07/2022 18:54   DG Chest Port 1 View  Result Date: 03/06/2022 CLINICAL DATA:  Dyspnea EXAM: PORTABLE CHEST 1 VIEW COMPARISON:  Chest radiograph dated 03/03/2022. FINDINGS: The heart size and mediastinal contours are within normal limits. Moderate to severe bilateral interstitial and airspace opacities appear increased from prior exam. A small right pleural effusion likely contributes. There is no left pleural effusion. There is no pneumothorax on either side. An endotracheal tube terminates in the upper thoracic trachea. An enteric tube terminates in the stomach. The osseous structures are unremarkable. IMPRESSION: Moderate to severe bilateral interstitial and airspace opacities appear increased from prior exam. A small right pleural effusion likely contributes. Electronically Signed   By: Zerita Boers M.D.   On: 03/06/2022 08:17   CT Angio Chest Pulmonary Embolism (PE) W or WO Contrast  Result Date: 03/03/2022 CLINICAL DATA:  Pulmonary embolism (PE) suspected, high prob EXAM: CT ANGIOGRAPHY CHEST WITH CONTRAST TECHNIQUE:  Multidetector CT imaging of the chest was performed using the standard protocol during bolus administration of intravenous contrast. Multiplanar CT image reconstructions and MIPs were obtained to evaluate the vascular anatomy. RADIATION DOSE REDUCTION: This exam was performed according to the departmental dose-optimization program which includes automated exposure control, adjustment of the mA and/or kV according to patient size and/or use of iterative reconstruction technique. CONTRAST:  157m OMNIPAQUE IOHEXOL 350 MG/ML SOLN COMPARISON:  02/24/2022 FINDINGS: Cardiovascular: Bilateral lower lobe pulmonary emboli are noted. Overall clot burden appears slightly reduced when compared to prior study. Elevated RV to LV ratio of 1.42, similar to prior study compatible with right heart dysfunction. Mediastinum/Nodes: Esophagus is dilated and fluid-filled. NG tube is in place terminating in the stomach. Endotracheal tube tip is just above the carina. No mediastinal, hilar, or axillary adenopathy. Lungs/Pleura: Extensive bilateral lower lobe airspace disease and ground-glass opacities in the upper lobes. Findings are worsening since prior study. Trace right pleural effusion. Upper Abdomen: Fluid-filled stomach. Gaseous distention of the colon. See abdominal CT report for further discussion. Musculoskeletal: No acute bony abnormality. Review of the MIP images confirms the above findings. IMPRESSION: Bilateral pulmonary emboli. Overall clot burden slightly decreased since previous study. Continued elevated RV to LV ratio of 1.42, similar to prior study compatible with right heart dysfunction. Worsening bilateral airspace disease, most confluent in the lower lobes. Trace right pleural effusion. Distended, fluid-filled stomach and esophagus. Esophageal fluid likely related to reflux. NG tube is in the stomach. Electronically Signed   By: KRolm BaptiseM.D.   On: 03/03/2022 22:18   CT ABDOMEN PELVIS W CONTRAST  Result Date:  03/03/2022 CLINICAL DATA:  Sepsis EXAM: CT ABDOMEN AND PELVIS WITH CONTRAST TECHNIQUE: Multidetector CT imaging of the abdomen and pelvis was performed using the standard protocol following bolus administration of intravenous contrast. RADIATION DOSE REDUCTION: This exam was performed according to the departmental dose-optimization program which includes automated exposure control, adjustment of the mA and/or kV according to patient size and/or use of iterative reconstruction technique. CONTRAST:  1060mOMNIPAQUE IOHEXOL 350 MG/ML SOLN COMPARISON:  06/26/2021 FINDINGS: Lower chest: Bilateral lower lobe pulmonary emboli noted. Bilateral lower lobe airspace disease. See chest CT report for further discussion. Hepatobiliary: No focal hepatic abnormality. Gallbladder unremarkable. Pancreas: No focal abnormality or ductal dilatation. Spleen: No focal abnormality.  Normal size. Adrenals/Urinary Tract: No adrenal abnormality. No focal renal  abnormality. No stones or hydronephrosis. Urinary bladder is unremarkable. Stomach/Bowel: Diffuse gaseous distention of the colon, likely ileus. Stomach and small bowel decompressed, grossly unremarkable. NG tube tip is in the distal stomach. Vascular/Lymphatic: No evidence of aneurysm or adenopathy. Reproductive: Uterus and adnexa unremarkable.  No mass. Other: No free fluid or free air. Musculoskeletal: No acute bony abnormality. IMPRESSION: Bilateral lower lobe pulmonary emboli with bilateral airspace disease. See chest CT for further discussion. Gaseous distention of the colon diffusely suggests ileus. No evidence of bowel obstruction. NG tube in the stomach. Electronically Signed   By: Rolm Baptise M.D.   On: 03/03/2022 22:12   DG Chest Port 1 View  Result Date: 03/03/2022 CLINICAL DATA:  History of PE EXAM: PORTABLE CHEST 1 VIEW COMPARISON:  Chest x-ray dated March 10, 2022 FINDINGS: Cardiac and mediastinal contours are within normal limits unchanged position of the ET  tube and gastric decompression tube. Unchanged left-greater-than-right heterogeneous opacities. No evidence of pleural effusion or pneumothorax. IMPRESSION: 1. Unchanged left-greater-than-right heterogeneous opacities. 2. Unchanged position of ET tube and gastric decompression tube. Electronically Signed   By: Yetta Glassman M.D.   On: 03/03/2022 13:56   DG Chest Port 1 View  Result Date: 03/02/2022 CLINICAL DATA:  [id number] Acute respiratory failure with hypoxia ([place]) [id number] EXAM: PORTABLE CHEST 1 VIEW COMPARISON:  Chest radiograph from one day prior. FINDINGS: Endotracheal tube tip is 5.5 cm above the carina. Enteric tube enters stomach with the tip not seen on this image. Stable cardiomediastinal silhouette with normal heart size. No pneumothorax. No pleural effusion. Patchy hazy opacities in the mid to lower lungs bilaterally, left greater than right, slightly improved. IMPRESSION: 1. Well-positioned endotracheal and enteric tubes. No pneumothorax. 2. Patchy hazy opacities in the mid to lower lungs bilaterally, left greater than right, slightly improved, favor improving pneumonia. Electronically Signed   By: Ilona Sorrel M.D.   On: 03/02/2022 08:25   DG Chest Port 1 View  Result Date: 03/01/2022 CLINICAL DATA:  Acute respiratory failure, hypoxia EXAM: PORTABLE CHEST 1 VIEW COMPARISON:  Prior chest x-ray 02/28/2022 FINDINGS: The endotracheal tube is 3.6 cm above the carina. Gastric tube remains in unchanged position. Similar appearance of the lungs with diffuse bilateral interstitial and airspace opacities more confluent on the left than the right. No pneumothorax. No large effusion. Stable cardiomegaly. IMPRESSION: 1. Overall, similar appearance of the lungs with diffuse bilateral interstitial and airspace opacities more confluent on the left than the right. As before, this could represent pulmonary edema, multifocal pneumonia, or ARDS. 2. Stable and satisfactory support apparatus. Electronically Signed    By: Jacqulynn Cadet M.D.   On: 03/01/2022 07:06   DG Chest Port 1 View  Result Date: 02/28/2022 CLINICAL DATA:  5727 through 3 with hypoxic respiratory failure, ventilator dependent. EXAM: PORTABLE CHEST 1 VIEW COMPARISON:  Portable chest yesterday at 8:39 a.m. FINDINGS: 5:08 a.m. ETT tip is 3.9 cm from the carina. Interval NGT insertion which is well inside the stomach but neither the side-hole or tip are filmed. Multiple overlying monitor wires. The heart is enlarged. Central vascular distension is still seen but has improved mildly. Widespread interstitial and patchy alveolar consolidative opacities are again noted, with relative apical sparing. The opacities are increasingly dense in the left midlung today, and elsewhere minimally less dense than previously. There are minimal pleural effusions. Findings could be due to edema, pneumonia or combination. Mediastinal configuration is stable. No acute osseous findings. IMPRESSION: 1. Support apparatus as above. 2. Slight improvement in central  vascular distension. 3. Widespread interstitial and patchy alveolar consolidative opacities, with increasingly dense opacities in the left mid lung today, and elsewhere minimally less dense than previously. Findings could be due to edema, pneumonia or combination. Electronically Signed   By: Telford Nab M.D.   On: 02/28/2022 07:16   DG Abd 1 View  Result Date: 02/27/2022 CLINICAL DATA:  [id number] Encounter for orogastric (OG) tube placement [id number] EXAM: ABDOMEN - 1 VIEW COMPARISON:  None Available. FINDINGS: Orogastric tube tip and side port overlie the stomach. Nonobstructive bowel gas pattern. IMPRESSION: Orogastric tube tip and side port overlie the stomach. Electronically Signed   By: Maurine Simmering M.D.   On: 02/27/2022 12:27   DG Chest Port 1 View  Result Date: 02/27/2022 CLINICAL DATA:  Acute respiratory failure EXAM: PORTABLE CHEST 1 VIEW COMPARISON:  02/25/22 CXR FINDINGS: Endotracheal tube terminates  approximately 4 cm above the carina. Compared to prior exam there is interval increase in bilateral patchy pulmonary opacities. Cardiac and mediastinal contours are unchanged from prior exam. Visualized upper abdomen notable for a distended stomach, slightly improved from prior. No displaced rib fractures. IMPRESSION: 1. Endotracheal tube terminates approximately 4 cm above the carina. 2. Interval increase in bilateral patchy pulmonary opacities, nonspecific, but worrisome for multifocal infection. Electronically Signed   By: Marin Roberts M.D.   On: 02/27/2022 09:08   DG Chest Port 1 View  Result Date: 02/25/2022 CLINICAL DATA:  Respiratory failure, EXAM: PORTABLE CHEST 1 VIEW COMPARISON:  02/25/2022 FINDINGS: Endotracheal tube seen 3.5 cm above the carina. Pulmonary insufflation has slightly diminished since prior examination but remains normal and symmetric. Bibasilar pulmonary infiltrates have progressed in the interval in keeping with infection or inflammation. No pneumothorax or pleural effusion. Cardiac size within normal limits. No acute bone abnormality. IMPRESSION: 1. Endotracheal tube in appropriate position. 2. Progressive bibasilar pulmonary infiltrates in keeping with infection or inflammation. Electronically Signed   By: Fidela Salisbury M.D.   On: 02/25/2022 19:41   DG Chest Port 1 View  Result Date: 02/25/2022 CLINICAL DATA:  Hypoxia, intubated EXAM: PORTABLE CHEST 1 VIEW COMPARISON:  02/24/2022 FINDINGS: Single frontal view of the chest demonstrates endotracheal tube overlying tracheal air column, tip midway between thoracic inlet and carina. Stable small right pleural effusion. There is bibasilar consolidation, with marked progression on the left since previous exams. Given findings of concurrent pulmonary embolus, pulmonary infarct could be considered though aspiration, pneumonia, and edema could give a similar pattern. No pneumothorax. Cardiac silhouette is unremarkable. No acute bony  abnormalities. IMPRESSION: 1. Endotracheal tube as above. 2. Progressive bibasilar airspace disease, left greater than right. This could reflect developing pulmonary infarcts given history of pulmonary emboli, though aspiration, edema, and infection could give a similar pattern. 3. Stable small right pleural effusion. Electronically Signed   By: Randa Ngo M.D.   On: 02/25/2022 16:24   PERIPHERAL VASCULAR CATHETERIZATION  Result Date: 02/25/2022 See surgical note for result.  ECHOCARDIOGRAM COMPLETE  Result Date: 02/24/2022    ECHOCARDIOGRAM REPORT   Patient Name:   KYARAH ENAMORADO Date of Exam: 02/24/2022 Medical Rec #:  [id number]             Height:       62.0 in Accession #:    [id number]            Weight:       126.1 lb Date of Birth:  Jan 31, 1961             BSA:  1.571 m Patient Age:    7 years              BP:           155/65 mmHg Patient Gender: F                     HR:           150 bpm. Exam Location:  [facility] Procedure: 2D Echo, Cardiac Doppler and Color Doppler Indications:     I26.09 Pulmonary Embolus  History:         Patient has no prior history of Echocardiogram examinations.                  Multiple Sclerosis.  Sonographer:     Cresenciano Lick RDCS Referring Phys:  [id number] AMY N COX Diagnosing Phys: Yolonda Kida MD IMPRESSIONS  1. Left ventricular ejection fraction, by estimation, is 60 to 65%. The left ventricle has normal function. The left ventricle has no regional wall motion abnormalities. Left ventricular diastolic parameters were normal.  2. Right ventricular systolic function is normal. The right ventricular size is normal.  3. The mitral valve is normal in structure. Trivial mitral valve regurgitation.  4. The aortic valve is normal in structure. Aortic valve regurgitation is not visualized. FINDINGS  Left Ventricle: Left ventricular ejection fraction, by estimation, is 60 to 65%. The left ventricle has normal function. The left ventricle has no  regional wall motion abnormalities. The left ventricular internal cavity size was normal in size. There is  no left ventricular hypertrophy. Left ventricular diastolic parameters were normal. Right Ventricle: The right ventricular size is normal. No increase in right ventricular wall thickness. Right ventricular systolic function is normal. Left Atrium: Left atrial size was normal in size. Right Atrium: Right atrial size was normal in size. Pericardium: There is no evidence of pericardial effusion. Mitral Valve: The mitral valve is normal in structure. Trivial mitral valve regurgitation. Tricuspid Valve: The tricuspid valve is normal in structure. Tricuspid valve regurgitation is not demonstrated. Aortic Valve: The aortic valve is normal in structure. Aortic valve regurgitation is not visualized. Pulmonic Valve: The pulmonic valve was normal in structure. Pulmonic valve regurgitation is not visualized. Aorta: The ascending aorta was not well visualized. IAS/Shunts: No atrial level shunt detected by color flow Doppler.  LEFT VENTRICLE PLAX 2D LVIDd:         3.70 cm LVIDs:         2.50 cm LV PW:         0.80 cm LV IVS:        0.80 cm LVOT diam:     1.70 cm LVOT Area:     2.27 cm  RIGHT VENTRICLE RV Basal diam:  3.70 cm RV S prime:     34.60 cm/s TAPSE (M-mode): 2.3 cm LEFT ATRIUM             Index        RIGHT ATRIUM          Index LA diam:        3.60 cm 2.29 cm/m   RA Area:     9.98 cm LA Vol (A2C):   19.7 ml 12.54 ml/m  RA Volume:   22.70 ml 14.45 ml/m LA Vol (A4C):   22.9 ml 14.57 ml/m LA Biplane Vol: 22.8 ml 14.51 ml/m   AORTA Ao Root diam: 3.00 cm Ao Asc diam:  3.10 cm TRICUSPID VALVE TR Peak grad:  49.3 mmHg TR Vmax:        351.00 cm/s  SHUNTS Systemic Diam: 1.70 cm Yolonda Kida MD Electronically signed by Yolonda Kida MD Signature Date/Time: 02/24/2022/11:42:30 PM    Final    US Venous Img Lower Bilateral (DVT)  Result Date: 02/24/2022 CLINICAL DATA:  History of pulmonary embolus. EXAM:  BILATERAL LOWER EXTREMITY VENOUS DOPPLER ULTRASOUND TECHNIQUE: Gray-scale sonography with graded compression, as well as color Doppler and duplex ultrasound were performed to evaluate the lower extremity deep venous systems from the level of the common femoral vein and including the common femoral, femoral, profunda femoral, popliteal and calf veins including the posterior tibial, peroneal and gastrocnemius veins when visible. The superficial great saphenous vein was also interrogated. Spectral Doppler was utilized to evaluate flow at rest and with distal augmentation maneuvers in the common femoral, femoral and popliteal veins. COMPARISON:  CT scan of same day. FINDINGS: RIGHT LOWER EXTREMITY Common Femoral Vein: No evidence of thrombus. Normal compressibility, respiratory phasicity and response to augmentation. Saphenofemoral Junction: No evidence of thrombus. Normal compressibility and flow on color Doppler imaging. Profunda Femoral Vein: No evidence of thrombus. Normal compressibility and flow on color Doppler imaging. Femoral Vein: No evidence of thrombus. Normal compressibility, respiratory phasicity and response to augmentation. Popliteal Vein: No evidence of thrombus. Normal compressibility, respiratory phasicity and response to augmentation. Calf Veins: There appears to be occlusive thrombus in the peroneal vein. Venous Reflux:  None. Other Findings:  None. LEFT LOWER EXTREMITY Common Femoral Vein: No evidence of thrombus. Normal compressibility, respiratory phasicity and response to augmentation. Saphenofemoral Junction: No evidence of thrombus. Normal compressibility and flow on color Doppler imaging. Profunda Femoral Vein: No evidence of thrombus. Normal compressibility and flow on color Doppler imaging. Femoral Vein: No evidence of thrombus. Normal compressibility, respiratory phasicity and response to augmentation. Popliteal Vein: No evidence of thrombus. Normal compressibility, respiratory phasicity  and response to augmentation. Calf Veins: There appears to be occlusive thrombus in the peroneal vein. Superficial Great Saphenous Vein: No evidence of thrombus. Normal compressibility. Venous Reflux:  None. Other Findings:  None. IMPRESSION: Occlusive thrombus is noted in the peroneal veins bilaterally. Electronically Signed   By: Marijo Conception M.D.   On: 02/24/2022 18:54   CT Angio Chest PE W and/or Wo Contrast  Result Date: 02/24/2022 CLINICAL DATA:  Shortness of breath, right chest pain EXAM: CT ANGIOGRAPHY CHEST WITH CONTRAST TECHNIQUE: Multidetector CT imaging of the chest was performed using the standard protocol during bolus administration of intravenous contrast. Multiplanar CT image reconstructions and MIPs were obtained to evaluate the vascular anatomy. RADIATION DOSE REDUCTION: This exam was performed according to the departmental dose-optimization program which includes automated exposure control, adjustment of the mA and/or kV according to patient size and/or use of iterative reconstruction technique. CONTRAST:  66m OMNIPAQUE IOHEXOL 350 MG/ML SOLN COMPARISON:  Chest radiograph done earlier today FINDINGS: Cardiovascular: Heart is enlarged in size. RV LV ratio is 1.3. This may be recent or chronic. There is homogeneous enhancement in thoracic aorta. There are filling defects in segmental and subsegmental branches in right upper lobe, right middle lobe, right lower lobe, left upper lobe and left lower lobe. There is moderate thrombus burden. Mediastinum/Nodes: No significant lymphadenopathy seen. Lungs/Pleura: There are patchy infiltrates in parahilar regions and lower lung fields, more so in right lower lobe. There is small right pleural effusion. There is no pneumothorax. Upper Abdomen: There is moderate to large sized fixed hiatal hernia. Musculoskeletal: No acute findings are seen.  Review of the MIP images confirms the above findings. IMPRESSION: Acute PE with moderate thrombus burden  involving both lungs. RV-LV ratio is 1.3 suggesting acute or chronic right heart dysfunction. Small right pleural effusion. There are patchy infiltrates in parahilar regions and both lower lung fields, more so in right lower lobe suggesting atelectasis/pneumonia. Part of this finding could be due to developing pulmonary infarcts. Moderate to large sized fixed hiatal hernia. Imaging findings were relayed to patient's provider Dr. Marjean Donna by telephone call. Electronically Signed   By: Elmer Picker M.D.   On: 02/24/2022 17:13   DG Chest 2 View  Result Date: 02/24/2022 CLINICAL DATA:  Short of breath EXAM: CHEST - 2 VIEW COMPARISON:  None Available. FINDINGS: Heart size and vascularity normal. Negative for heart failure. Mild bibasilar airspace disease likely atelectasis. Small right pleural effusion. IMPRESSION: Mild bibasilar atelectasis.  Small right pleural effusion Electronically Signed   By: Franchot Gallo M.D.   On: 02/24/2022 16:05     Assessment and plan-   # Leukocytosis, predominantly neutrophilia and monocytosis. Not responding to antibiotics. Peripheral blood smear showed small amount of blast which is nonspecific. Leukemoid reaction versus underlying undiagnosed myeloid neoplasm. Check flow cytometry, uric acid, LDH, BCR-ABL 1 FISH, CMV, EBV JAK2 mutation with reflex. Bone marrow biopsy can be considered. Currently patient is very debilitated due to multiple medical problems.  # Acute respiratory failure/VDRF managed by ICU intensivist #bilateral pulmonary embolism, possibly provoked by COVID-19 infection. Todays' lower extremity US + proximal DVT bilaterally Continue anticoagulation with Lovenox, currently she is on 40 mg daily not a therapeutic dose, if cleared by neurology, recommend to therapeutic anticoagulation with heparin gtt without bolus.   # Anemia, check iron, TIBC ferritin reticulocyte panel.  Folate Thank you for allowing me to participate in the care of this  patient.   Earlie Server, MD, PhD Hematology Oncology 03/13/2022

## 2022-03-13 NOTE — Progress Notes (Signed)
PT Cancellation Note  Patient Details Name: [name] MRN: [id number] DOB: Apr 05, 1961   Cancelled Treatment:    Reason Eval/Treat Not Completed: Medical issues which prohibited therapy: Pt continues to present with elevated resting HR of 140 bpm and RR of 30.  Will attempt to see pt at a future date/time as medically appropriate.   Ovidio Hanger PT, DPT 03/13/22, 8:33 AM

## 2022-03-13 NOTE — Progress Notes (Signed)
ID Remains intubated Continues to have fever with no response to antibiotics Patient Vitals for the past 24 hrs:  BP Temp Temp src Pulse Resp SpO2 Weight  03/13/22 1000 105/61 -- -- (!) 110 (!) 35 98 % --  03/13/22 0900 122/64 -- -- (!) 128 (!) 26 94 % --  03/13/22 0830 -- (!) 101.4 F (38.6 C) Axillary -- -- -- --  03/13/22 0800 (!) 141/69 -- -- (!) 147 (!) 29 98 % --  03/13/22 0600 139/68 -- -- (!) 140 (!) 30 96 % --  03/13/22 0500 (!) 140/73 -- -- (!) 139 (!) 29 96 % 66.8 kg  03/13/22 0400 127/66 100.3 F (37.9 C) Axillary (!) 136 (!) 29 96 % --  03/13/22 0300 124/67 -- -- (!) 133 (!) 32 97 % --  03/13/22 0200 132/63 -- -- (!) 130 (!) 32 96 % --  03/13/22 0100 (!) 126/59 -- -- (!) 123 (!) 29 98 % --  03/13/22 0000 124/62 99.9 F (37.7 C) Axillary (!) 117 (!) 29 100 % --  03/12/22 2300 125/71 -- -- (!) 116 (!) 29 99 % --  03/12/22 2200 121/68 -- -- (!) 105 (!) 29 98 % --  03/12/22 2100 131/64 -- -- (!) 139 (!) 29 98 % --  03/12/22 2000 120/64 (!) 100.5 F (38.1 C) Axillary (!) 134 (!) 29 96 % --  03/12/22 1900 119/65 -- -- (!) 131 (!) 33 96 % --  03/12/22 1800 129/67 -- -- (!) 132 (!) 33 96 % --  03/12/22 1700 136/63 (!) 101.3 F (38.5 C) Axillary (!) 130 (!) 36 95 % --  03/12/22 1600 134/60 -- -- (!) 126 (!) 32 95 % --  03/12/22 1500 133/68 -- -- (!) 121 (!) 30 97 % --  03/12/22 1400 131/66 -- -- (!) 115 (!) 25 97 % --  03/12/22 1329 -- -- -- (!) 120 (!) 32 97 % --  03/12/22 1300 135/74 -- -- (!) 119 (!) 32 97 % --  03/12/22 1200 132/72 100.2 F (37.9 C) Axillary -- (!) 32 -- --    Chest b/l air entry- crepts Abd soft Hs s1s2-Tachycardia Cns cannot be assessed  Labs    Latest Ref Rng & Units 03/13/2022    4:54 AM 03/12/2022    3:31 AM 03/11/2022    4:21 AM  CBC  WBC 4.0 - 10.5 K/uL 25.7  25.7  24.7   Hemoglobin 12.0 - 15.0 g/dL 8.8  8.9  7.5   Hematocrit 36.0 - 46.0 % 29.1  29.1  24.9   Platelets 150 - 400 K/uL 273  303  322        Latest Ref Rng & Units  03/13/2022    4:54 AM 03/12/2022    3:31 AM 03/11/2022    4:21 AM  CMP  Glucose 70 - 99 mg/dL 123  125  130   BUN 6 - 20 mg/dL _0 Creatinine 0.44 - 1.00 mg/dL <0.30  0.36  0.40   Sodium 135 - 145 mmol/L 138  139  137   Potassium 3.5 - 5.1 mmol/L 3.6  3.8  3.8   Chloride 98 - 111 mmol/L 101  102  107   CO2 22 - 32 mmol/L _1 Calcium 8.9 - 10.3 mg/dL 8.7  9.0  8.9     Micto 11/21 BC / tracheal culture NG 11/26 tracheal culture neg 11/27 BC - NG  Cdiff neg   Impression/recommendation  61 yr female with h/o MS wheel chair bound presented with SOB and rt sided chest pain with recent covid infection   Acute PE b/l with mod- severe clot burden with strain Underwent thrombectomy Complicated by severe pulmonary hemorrhage requiring bronchoscopy to clear the clots on 02/25/22 Ongoing fever and leucocytosis- had not  responded to antibiotics-until it was changed to  meropenem and linezolid- was afebrile \for 24 hrs  but started fever again  and also leucocytosis fluctuating,. Will stop antibiotics today Unclear cause of fever Was the fever due to pulmonary infarct? ?? ARDS ( as per pulmonologist her resp status improving ? Aspiration pneumonitis   rule out acute myelomonocytic leukemia causing fever - peripheral smear shows blasts 1-2%, monocytosis- heme onc consult, bone marrow biopsy( a patient's son died of acute leukemia in 06-18-18 age 59 yrs) Onc on board  CNS micro hemorrhages brain    Discussed the management with care team ID will follow her peripherally this weekend - call if needed

## 2022-03-13 NOTE — Progress Notes (Signed)
Pulmonary and Critical care    NAME:  [name], MRN:  [id number], DOB:  21-Aug-1960, LOS: 17 ADMISSION DATE:  02/24/2022,     History of Present Illness:  The patient is off sedation.  Has been tracking with eye movements in the room.  Will to move any extremities to commands has been tolerating tube feeds.  Not on any sedation. Running any vasopressors.         03/13/2022    1:00 PM 03/13/2022   12:00 PM 03/13/2022   11:00 AM  Vitals with BMI  Systolic 130 116 675  Diastolic 67 66 63  Pulse 134 125 115    Vent Mode: PRVC FiO2 (%):  [35 %] 35 % Set Rate:  [16 bmp] 16 bmp Vt Set:  [440 mL] 440 mL PEEP:  [5 cmH20] 5 cmH20 Plateau Pressure:  [15 cmH20] 15 cmH20   Examination: General: Awake comfortable tracking with neck movements and eye movements HENT: Mild pallor no JVD Lungs: Lungs air entry was heard bilaterally crackles at the bases Cardiovascular: S1-S2 heard no murmurs Abdomen: Soft nontender bowel sounds heard Extremities: No edema or cyanosis Neuro: Did not move extremities to commands or spontaneously.   Assessment & Plan:  1.  Acute hypoxic respiratory failure 2.  Bilateral pulmonary embolism 3.  Pulmonary hemorrhage 4. Multiple sclerosis  5.  Metabolic encephalopathy. 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  Failure to wean from ventilator/ventilator dependence 8.  Recurrent fever Plan Hematology evaluation Tracheostomy and feeding tube next week Transition to formoterol and Yupelri Meropenem and linezolid Best Practice (right click and "Reselect all SmartList Selections" daily)   Diet/type: tubefeeds DVT prophylaxis: LMWH GI prophylaxis: PPI Lines: N/A Foley:  Yes, and it is still needed Code Status:  dnr   Labs   CBC: Recent Labs  Lab 03/08/22 0841 03/08/22 2105 03/09/22 0220 03/09/22 1943 03/10/22 0621 03/11/22 0421 03/12/22 0331 03/13/22 0454  WBC 36.3* 39.5*   < > 38.1* 28.3* 24.7* 25.7* 25.7*  NEUTROABS 23.9* 25.9*  --  26.3*  --   --  15.3*  15.7*  HGB 7.5* 7.8*   < > 7.7* 7.4* 7.5* 8.9* 8.8*  HCT 24.5* 25.4*   < > 26.0* 24.8* 24.9* 29.1* 29.1*  MCV 93.2 93.7   < > 95.6 95.0 93.3 93.9 94.5  PLT 189 241   < > 308 261 322 303 273   < > = values in this interval not displayed.    Basic Metabolic Panel: Recent Labs  Lab 03/09/22 0220 03/10/22 0621 03/11/22 0421 03/12/22 0331 03/13/22 0454  NA 141 142 137 139 138  K 3.4* 3.6 3.8 3.8 3.6  CL 103 109 107 102 101  CO2 29 27 24 28 29   GLUCOSE 132* 144* 130* 125* 123*  BUN 24* 25* 23* 20 21*  CREATININE 0.40* 0.35* 0.40* 0.36* <0.30*  CALCIUM 8.7* 8.6* 8.9 9.0 8.7*  MG 2.2 2.2 2.3 2.2 2.2  PHOS 2.2* 2.7 3.2 3.0 2.2*   GFR: CrCl cannot be calculated (This lab value cannot be used to calculate CrCl because it is not a number: <0.30). Recent Labs  Lab 03/08/22 0855 03/08/22 2105 03/10/22 0621 03/11/22 0421 03/12/22 0331 03/13/22 0454  PROCALCITON 1.45  --   --   --   --   --   WBC  --    < > 28.3* 24.7* 25.7* 25.7*   < > = values in this interval not displayed.    Liver Function Tests: Recent Labs  Lab 03/08/22  0855  AST 44*  ALT 48*  ALKPHOS 215*  BILITOT 0.9  PROT 5.6*  ALBUMIN 2.4*   No results for input(s): "LIPASE", "AMYLASE" in the last 168 hours. No results for input(s): "AMMONIA" in the last 168 hours.  ABG    Component Value Date/Time   PHART 7.59 (H) 03/07/2022 0848   PCO2ART 43 03/07/2022 0848   PO2ART 87 03/07/2022 0848   HCO3 41.3 (H) 03/07/2022 0848   ACIDBASEDEF 4.0 (H) 02/26/2022 0307   O2SAT 98.6 03/07/2022 0848     Coagulation Profile: No results for input(s): "INR", "PROTIME" in the last 168 hours.  Cardiac Enzymes: No results for input(s): "CKTOTAL", "CKMB", "CKMBINDEX", "TROPONINI" in the last 168 hours.  HbA1C: No results found for: "HGBA1C"  CBG: Recent Labs  Lab 03/12/22 1936 03/12/22 2305 03/13/22 0332 03/13/22 0801 03/13/22 1217  GLUCAP 138* 133* 141* 130* 140*    RADIOLOGY Chest x-ray from November 26  showed bilateral infiltrates endotracheal tube in position.  Review of Systems:   Unable to obtain review of systems patient is on the ventilator  Past Medical History:  She,  has a past medical history of GERD (gastroesophageal reflux disease) and Multiple sclerosis (HCC).   Surgical History:   Past Surgical History:  Procedure Laterality Date   ESOPHAGOGASTRODUODENOSCOPY (EGD) WITH PROPOFOL N/A 09/11/2021   Procedure: ESOPHAGOGASTRODUODENOSCOPY (EGD) WITH PROPOFOL;  Surgeon: Jaynie Collins, DO;  Location: [facility] ENDOSCOPY;  Service: Gastroenterology;  Laterality: N/A;   PULMONARY THROMBECTOMY Bilateral 02/25/2022   Procedure: PULMONARY THROMBECTOMY;  Surgeon: Annice Needy, MD;  Location: [facility] INVASIVE CV LAB;  Service: Cardiovascular;  Laterality: Bilateral;   TUBAL LIGATION       Social History:   reports that she has never smoked. She has never used smokeless tobacco. She reports current alcohol use. She reports that she does not use drugs.   Family History:  Her family history is not on file.   Allergies Allergies  Allergen Reactions   Erythromycin Hives and Nausea And Vomiting   Lexapro [Escitalopram Oxalate] Hives     Home Medications  Prior to Admission medications   Medication Sig Start Date End Date Taking? Authorizing Provider  cyanocobalamin 1000 MCG tablet Take by mouth.   Yes [provider]  DULoxetine (CYMBALTA) 30 MG capsule Take 30 mg by mouth daily. 06/25/21  Yes [provider]  gabapentin (NEURONTIN) 400 MG capsule Take Gabapentin 400 mg in the morning, 400 mg in the afternoon, and 800 mg at night 12/29/21  Yes [provider]  omeprazole (PRILOSEC) 20 MG capsule Take 20 mg by mouth daily. 06/10/21  Yes [provider]  propranolol (INDERAL) 20 MG tablet Take 1 tablet by mouth 2 (two) times daily. 12/16/21  Yes [provider]  rOPINIRole (REQUIP) 0.25 MG tablet Take 0.25 mg at night for 1 week, then increase to  0.5 mg at night 05/29/21  Yes [provider]  tolterodine (DETROL LA) 2 MG 24 hr capsule Take 2 mg by mouth 2 (two) times daily. 12/29/21 12/29/22 Yes [provider]  ascorbic acid (VITAMIN C) 500 MG tablet Take by mouth.    [provider]  Cholecalciferol 50 MCG (2000 UT) CAPS Take by mouth.    [provider]  cyclobenzaprine (FLEXERIL) 10 MG tablet Take 10 mg by mouth 2 (two) times daily as needed. 11/26/21   [provider]  dalfampridine 10 MG TB12 Take 1 tablet by mouth every 12 (twelve) hours. 12/29/21   [provider]  meloxicam (MOBIC) 15 MG tablet Take 1 tablet (15 mg total) by mouth daily. 08/23/21 08/23/22  Cuthriell, Delorise Royals, PA-C  methylPREDNISolone (MEDROL DOSEPAK) 4 MG TBPK tablet Take by mouth. 11/26/21   [provider]  naproxen (NAPROSYN) 500 MG tablet Take by mouth.    [provider]  nitrofurantoin, macrocrystal-monohydrate, (MACROBID) 100 MG capsule Take 1 capsule (100 mg total) by mouth 2 (two) times daily. 01/19/22   Immordino, Jeannett Senior, FNP  pregabalin (LYRICA) 75 MG capsule Take 75 mg by mouth 3 (three) times daily. 08/18/21   [provider]  traMADol (ULTRAM) 50 MG tablet Take 50 mg by mouth 3 (three) times daily as needed. 12/26/21   [provider]       Daphney Hopke MD ([facility]) FOR  Pulmonary & Critical Care

## 2022-03-13 NOTE — Progress Notes (Signed)
I was asked to comment on anticoagulation given chronic microhemorrhages. Her microhemorrhages are relatively new although they are chronic but they are new from June 2023. Appearance of these microhemorrhages does not look typical for amyloid, which bleed very easily without antiplatelets anticoagulants and much more easily if the patient is on antiplatelets or anticoagulants and generally the recommendation is to avoid any kind of blood thinning. In her case, she still has a pretty significant clot burden in her chest as well as has now shown to have new bilateral lower extremity DVT-she will need anticoagulation.  Caveat is that she would always be at high risk for intracranial bleed but at this point anticoagulation is necessary to prevent expansion of the PE and DVT. I recommend heparin drip without bolus, once she is therapeutic, will get a scan-CT head without contrast-to rule out any acute bleeding.  If no bleeding and the scan is stable, then we can switch her over to DOAC at that time. This was discussed with the multidisciplinary team of hematology oncology, infectious diseases and PCCM attendings. Please call with questions as needed.   -- Milon Dikes, MD Neurologist [facility] Pager: [phone]

## 2022-03-13 NOTE — Progress Notes (Signed)
PHARMACY CONSULT NOTE  Pharmacy Consult for Electrolyte Monitoring and Replacement   Recent Labs: Potassium (mmol/L)  Date Value  03/13/2022 3.6   Magnesium (mg/dL)  Date Value  29/51/8841 2.2   Calcium (mg/dL)  Date Value  66/09/3014 8.7 (L)   Albumin (g/dL)  Date Value  04/21/3233 2.4 (L)   Phosphorus (mg/dL)  Date Value  57/32/2025 2.2 (L)   Sodium (mmol/L)  Date Value  03/13/2022 138   Assessment: 61 yo female presented to ED with SOB when lying flat and some right sided rib and shoulder pain.  Chest CT showed acute PE.  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 Pharmacy is asked to follow and replace electrolytes while in CCU.  Nutrition: Tube feeds at 49ml/h, FWF 100 q6h  Goal of Therapy:  Electrolytes within normal limits  Plan:  --Phos 2.2, Phos-Nak 1 packet TIDHS x 3 doses --Follow-up electrolytes with AM labs tomorrow  Tressie Ellis 03/13/2022 9:39 AM

## 2022-03-13 NOTE — Progress Notes (Signed)
OT Cancellation Note  Patient Details Name: [name] MRN: [id number] DOB: June 22, 1960   Cancelled Treatment:    Reason Eval/Treat Not Completed: Medical issues which prohibited therapy. Pt with red MEWS from fever of 101.4, elevated HR, and elevated RR. OT to hold therapeutic intervention at this time and will re-attempt when pt is medically appropriate.   Jackquline Denmark, MS, OTR/L , CBIS ascom [phone]  03/13/22, 11:41 AM

## 2022-03-13 NOTE — Consult Note (Addendum)
ANTICOAGULATION CONSULT NOTE - Initial Consult  Pharmacy Consult for heparin infusion - NO BOLUS Indication: DVT  Allergies  Allergen Reactions   Erythromycin Hives and Nausea And Vomiting   Lexapro [Escitalopram Oxalate] Hives    Patient Measurements: Height: 5\' 2"  (157.5 cm) Weight: 66.8 kg (147 lb 4.3 oz) IBW/kg (Calculated) : 50.1 Heparin Dosing Weight: 66.8kg  Vital Signs: Temp: 101.4 F (38.6 C) (12/01 0830) Temp Source: Axillary (12/01 0830) BP: 130/67 (12/01 1300) Pulse Rate: 134 (12/01 1300)  Labs: Recent Labs    03/11/22 0421 03/12/22 0331 03/13/22 0454  HGB 7.5* 8.9* 8.8*  HCT 24.9* 29.1* 29.1*  PLT 322 303 273  CREATININE 0.40* 0.36* <0.30*    CrCl cannot be calculated (This lab value cannot be used to calculate CrCl because it is not a number: <0.30).   Medical History: Past Medical History:  Diagnosis Date   GERD (gastroesophageal reflux disease)    Multiple sclerosis (HCC)     Medications:  PTA: N/A Inpatient: Heparin infusion (12/1 >) Allergies: No AC/APT related allergies  Assessment: 61 yo female presented to ED with SOB when lying flat and some right sided rib and shoulder pain. Chest CT showed acute PE.  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 Status post thrombectomy 11/15. Heparin was held in setting of hemoptysis on 11/19. Patient was assessed by neurology and found to have relatively new microhemorrhages. Pharmacy consulted for management of heparin infusion in the setting of DVT without bolus per neurology recommendations.   Date    Time    aPTT/HL         Rate/Comment 1117    2311    0.21                 subtherapeutic; 1150 un/hr .... Removed d/t length of note, see older entry if needed... 1122    0833    0.51                Supratherapeutic 1200>1150 un/hr 1122    1834    0.19                Subtherapeutic 1150>1200 un/hr        1123    0224    0.32                Therapeutic x 1  1124    0513    0.17                 SUBtherapeutic 1200 > 1300 un/hr 1124    1200    0.24                SUBtherapeutic 1300 > 1350 un/hr 1124    1913   0.30                 Therapeutic x 1 1125    0059    0.21                SUBtherapeutic @ 1350>1400 un/hr 1125    0755    0.15                SUBtherapeutic 1400 > 1500 un/hr 11/25   1639    0.31                Therapeutic x1 11/25   2329    0.32                Therapeutic x 2  11/26  0628    0.16                Subtherapeutic; 1500>1600 un/hr 11/26   1506    0.28                Subtherapeutic ; 1600 un/hr > 1650 un/hr 11/27   0220    0.33                Therapeutic x 1  11/27   0740    0.38                Therapeutic x 2   Goal of Therapy:  Heparin level 0.3-0.5 units/ml (NO BOLUSES)  Monitor platelets by anticoagulation protocol: Yes   Plan:  Patient was previously therapeutic at heparin infusoin rate of 1650, will resume this without bolus per neurology recommendations.  No bolus per neurology recs. Start heparin infusion at 1650 units/hr Check anti-Xa level in 6 hours and daily once consecutively therapeutic. Continue to monitor H&H and platelets daily while on heparin gtt  Brandon Pharmacist 03/13/2022 4:50 PM

## 2022-03-14 ENCOUNTER — Inpatient Hospital Stay: Payer: Medicare Other

## 2022-03-14 DIAGNOSIS — I2699 Other pulmonary embolism without acute cor pulmonale: Secondary | ICD-10-CM | POA: Diagnosis not present

## 2022-03-14 LAB — GLUCOSE, CAPILLARY
Glucose-Capillary: 119 mg/dL — ABNORMAL HIGH (ref 70–99)
Glucose-Capillary: 110 mg/dL — ABNORMAL HIGH (ref 70–99)
Glucose-Capillary: 107 mg/dL — ABNORMAL HIGH (ref 70–99)
Glucose-Capillary: 136 mg/dL — ABNORMAL HIGH (ref 70–99)
Glucose-Capillary: 135 mg/dL — ABNORMAL HIGH (ref 70–99)
Glucose-Capillary: 134 mg/dL — ABNORMAL HIGH (ref 70–99)

## 2022-03-14 LAB — CULTURE, BLOOD (ROUTINE X 2)
Culture: NO GROWTH
Culture: NO GROWTH
Culture: NO GROWTH
Culture: NO GROWTH
Special Requests: ADEQUATE
Special Requests: ADEQUATE
Special Requests: ADEQUATE

## 2022-03-14 LAB — BASIC METABOLIC PANEL
Anion gap: 7 (ref 5–15)
BUN: 27 mg/dL — ABNORMAL HIGH (ref 6–20)
CO2: 31 mmol/L (ref 22–32)
Calcium: 8.8 mg/dL — ABNORMAL LOW (ref 8.9–10.3)
Chloride: 99 mmol/L (ref 98–111)
Creatinine, Ser: 0.39 mg/dL — ABNORMAL LOW (ref 0.44–1.00)
GFR, Estimated: >60 mL/min (ref >60)
Glucose, Bld: 145 mg/dL — ABNORMAL HIGH (ref 70–99)
Potassium: 4.4 mmol/L (ref 3.5–5.1)
Sodium: 137 mmol/L (ref 135–145)

## 2022-03-14 LAB — CBC
HCT: 27.5 % — ABNORMAL LOW (ref 36.0–46.0)
Hemoglobin: 8.2 g/dL — ABNORMAL LOW (ref 12.0–15.0)
MCH: 27.8 pg (ref 26.0–34.0)
MCHC: 29.8 g/dL — ABNORMAL LOW (ref 30.0–36.0)
MCV: 93.2 fL (ref 80.0–100.0)
Platelets: 268 10*3/uL (ref 150–400)
RBC: 2.95 MIL/uL — ABNORMAL LOW (ref 3.87–5.11)
RDW: 19.6 % — ABNORMAL HIGH (ref 11.5–15.5)
WBC: 26.2 10*3/uL — ABNORMAL HIGH (ref 4.0–10.5)
nRBC: 0 % (ref 0.0–0.2)

## 2022-03-14 LAB — CK: Total CK: 16 U/L — ABNORMAL LOW (ref 38–234)

## 2022-03-14 LAB — PHOSPHORUS: Phosphorus: 3.2 mg/dL (ref 2.5–4.6)

## 2022-03-14 LAB — HEPARIN LEVEL (UNFRACTIONATED)
Heparin Unfractionated: 0.32 IU/mL (ref 0.30–0.70)
Heparin Unfractionated: 0.34 IU/mL (ref 0.30–0.70)

## 2022-03-14 MED ORDER — SODIUM CHLORIDE 0.9 % IV SOLN: INTRAVENOUS | Status: DC

## 2022-03-14 NOTE — Progress Notes (Signed)
PHARMACY CONSULT NOTE  Pharmacy Consult for Electrolyte Monitoring and Replacement   Recent Labs: Potassium (mmol/L)  Date Value  03/14/2022 4.4   Magnesium (mg/dL)  Date Value  51/70/0174 2.2   Calcium (mg/dL)  Date Value  94/49/6759 8.8 (L)   Albumin (g/dL)  Date Value  16/38/4665 2.4 (L)   Phosphorus (mg/dL)  Date Value  99/35/7017 3.2   Sodium (mmol/L)  Date Value  03/14/2022 137   Assessment: 61 yo female presented to ED with SOB when lying flat and some right sided rib and shoulder pain.  Chest CT showed acute PE.  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 Pharmacy is asked to follow and replace electrolytes while in CCU.  Nutrition: Tube feeds at 50ml/h, FWF 100 q6h  Goal of Therapy:  Electrolytes within normal limits  Plan:  --no electrolyte replacement at this time --Follow-up electrolytes if ordered with AM labs   Kofi Murrell A 03/14/2022 12:07 PM

## 2022-03-14 NOTE — Progress Notes (Signed)
Pulmonary and Critical care    NAME:  [name], MRN:  [id number], DOB:  [birth year], LOS: 18 ADMISSION DATE:  02/24/2022,     History of Present Illness:  The patient had a CT head done today.  She was started on heparin drip yesterday.  Also antibiotics were stopped yesterday.  She has been tachycardic.  Fluid bolus was given which seemed to help tachycardia.  She has been awake and has been moving head and tracking in the room.  CPK not elevated.             03/14/2022   10:00 AM 03/14/2022    9:05 AM 03/14/2022    9:00 AM  Vitals with BMI  Systolic 116 98 83  Diastolic 53 48 50  Pulse 114 124 118    Vent Mode: PRVC FiO2 (%):  [35 %] 35 % Set Rate:  [16 bmp] 16 bmp Vt Set:  [440 mL] 440 mL PEEP:  [5 cmH20] 5 cmH20 Plateau Pressure:  [17 cmH20] 17 cmH20   Examination:  General: Awake comfortable tracking with neck movements and eye movements HENT: Mild pallor no JVD Lungs: Lungs air entry was heard bilaterally crackles at the bases Cardiovascular: S1-S2 heard no murmurs Abdomen: Soft nontender bowel sounds heard Extremities: No edema or cyanosis Neuro: Did not move extremities to commands or spontaneously.   Assessment & Plan:  1.  Acute hypoxic respiratory failure 2.  Bilateral pulmonary embolism 3.  Pulmonary hemorrhage 4. Multiple sclerosis  5.  Metabolic encephalopathy. 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  Failure to wean from ventilator/ventilator dependence 8.  Recurrent fever 9.Cerebral microhemorrhages 10. DVT 11, acute on chronic anemia  Plan CT head done results pending. Tracheostomy and feeding tube next week Transition to formoterol and Yupelri Antibiotics have been stopped Continue on heparin drip follow results of CT head. Best Practice (right click and "Reselect all SmartList Selections" daily)   Diet/type: tubefeeds DVT prophylaxis: LMWH GI prophylaxis: PPI Lines: N/A Foley:  Yes, and it is still needed Code Status:  dnr  Labs   CBC: Recent  Labs  Lab 03/08/22 0841 03/08/22 2105 03/09/22 0220 03/09/22 1943 03/10/22 0621 03/11/22 0421 03/12/22 0331 03/13/22 0454 03/14/22 0504  WBC 36.3* 39.5*   < > 38.1* 28.3* 24.7* 25.7* 25.7* 26.2*  NEUTROABS 23.9* 25.9*  --  26.3*  --   --  15.3* 15.7*  --   HGB 7.5* 7.8*   < > 7.7* 7.4* 7.5* 8.9* 8.8* 8.2*  HCT 24.5* 25.4*   < > 26.0* 24.8* 24.9* 29.1* 29.1* 27.5*  MCV 93.2 93.7   < > 95.6 95.0 93.3 93.9 94.5 93.2  PLT 189 241   < > 308 261 322 303 273 268   < > = values in this interval not displayed.    Basic Metabolic Panel: Recent Labs  Lab 03/09/22 0220 03/10/22 0621 03/11/22 0421 03/12/22 0331 03/13/22 0454 03/14/22 0504  NA 141 142 137 139 138 137  K 3.4* 3.6 3.8 3.8 3.6 4.4  CL 103 109 107 102 101 99  CO2 29 27 24 28 29 31   GLUCOSE 132* 144* 130* 125* 123* 145*  BUN 24* 25* 23* 20 21* 27*  CREATININE 0.40* 0.35* 0.40* 0.36* <0.30* 0.39*  CALCIUM 8.7* 8.6* 8.9 9.0 8.7* 8.8*  MG 2.2 2.2 2.3 2.2 2.2  --   PHOS 2.2* 2.7 3.2 3.0 2.2* 3.2   GFR: Estimated Creatinine Clearance: 68.6 mL/min (A) (by C-G formula based on SCr of 0.39 mg/dL (L)). Recent  Labs  Lab 03/08/22 0855 03/08/22 2105 03/11/22 0421 03/12/22 0331 03/13/22 0454 03/14/22 0504  PROCALCITON 1.45  --   --   --   --   --   WBC  --    < > 24.7* 25.7* 25.7* 26.2*   < > = values in this interval not displayed.    Liver Function Tests: Recent Labs  Lab 03/08/22 0855  AST 44*  ALT 48*  ALKPHOS 215*  BILITOT 0.9  PROT 5.6*  ALBUMIN 2.4*   No results for input(s): "LIPASE", "AMYLASE" in the last 168 hours. No results for input(s): "AMMONIA" in the last 168 hours.  ABG    Component Value Date/Time   PHART 7.59 (H) 03/07/2022 0848   PCO2ART 43 03/07/2022 0848   PO2ART 87 03/07/2022 0848   HCO3 41.3 (H) 03/07/2022 0848   ACIDBASEDEF 4.0 (H) 02/26/2022 0307   O2SAT 98.6 03/07/2022 0848     Coagulation Profile: Recent Labs  Lab 03/13/22 1653  INR 1.2    Cardiac Enzymes: Recent  Labs  Lab 03/14/22 0504  CKTOTAL 16*    HbA1C: No results found for: "HGBA1C"  CBG: Recent Labs  Lab 03/13/22 1925 03/13/22 2322 03/14/22 0315 03/14/22 0750 03/14/22 1204  GLUCAP 149* 144* 119* 110* 107*       Review of Systems:   Unable to get any review of systems from the patient   Past Medical History:  She,  has a past medical history of GERD (gastroesophageal reflux disease) and Multiple sclerosis (HCC).   Surgical History:   Past Surgical History:  Procedure Laterality Date   ESOPHAGOGASTRODUODENOSCOPY (EGD) WITH PROPOFOL N/A 09/11/2021   Procedure: ESOPHAGOGASTRODUODENOSCOPY (EGD) WITH PROPOFOL;  Surgeon: Jaynie Collins, DO;  Location[facility] ENDOSCOPY;  Service: Gastroenterology;  Laterality: N/A;   PULMONARY THROMBECTOMY Bilateral 02/25/2022   Procedure: PULMONARY THROMBECTOMY;  Surgeon: Annice Needy, MD;  Location: [facility] INVASIVE CV LAB;  Service: Cardiovascular;  Laterality: Bilateral;   TUBAL LIGATION       Social History:   reports that she has never smoked. She has never used smokeless tobacco. She reports current alcohol use. She reports that she does not use drugs.   Family History:  Her family history is not on file.   Allergies Allergies  Allergen Reactions   Erythromycin Hives and Nausea And Vomiting   Lexapro [Escitalopram Oxalate] Hives     Home Medications  Prior to Admission medications   Medication Sig Start Date End Date Taking? Authorizing Provider  cyanocobalamin 1000 MCG tablet Take by mouth.   Yes [provider]  DULoxetine (CYMBALTA) 30 MG capsule Take 30 mg by mouth daily. 06/25/21  Yes [provider]  gabapentin (NEURONTIN) 400 MG capsule Take Gabapentin 400 mg in the morning, 400 mg in the afternoon, and 800 mg at night 12/29/21  Yes [provider]  omeprazole (PRILOSEC) 20 MG capsule Take 20 mg by mouth daily. 06/10/21  Yes [provider]  propranolol (INDERAL) 20 MG tablet Take 1  tablet by mouth 2 (two) times daily. 12/16/21  Yes [provider]  rOPINIRole (REQUIP) 0.25 MG tablet Take 0.25 mg at night for 1 week, then increase to 0.5 mg at night 05/29/21  Yes [provider]  tolterodine (DETROL LA) 2 MG 24 hr capsule Take 2 mg by mouth 2 (two) times daily. 12/29/21 12/29/22 Yes [provider]  ascorbic acid (VITAMIN C) 500 MG tablet Take by mouth.    [provider]  Cholecalciferol  50 MCG (2000 UT) CAPS Take by mouth.    [provider]  cyclobenzaprine (FLEXERIL) 10 MG tablet Take 10 mg by mouth 2 (two) times daily as needed. 11/26/21   [provider]  dalfampridine 10 MG TB12 Take 1 tablet by mouth every 12 (twelve) hours. 12/29/21   [provider]  meloxicam (MOBIC) 15 MG tablet Take 1 tablet (15 mg total) by mouth daily. 08/23/21 08/23/22  Cuthriell, Delorise Royals, PA-C  methylPREDNISolone (MEDROL DOSEPAK) 4 MG TBPK tablet Take by mouth. 11/26/21   [provider]  naproxen (NAPROSYN) 500 MG tablet Take by mouth.    [provider]  nitrofurantoin, macrocrystal-monohydrate, (MACROBID) 100 MG capsule Take 1 capsule (100 mg total) by mouth 2 (two) times daily. 01/19/22   Immordino, Jeannett Senior, FNP  pregabalin (LYRICA) 75 MG capsule Take 75 mg by mouth 3 (three) times daily. 08/18/21   [provider]  traMADol (ULTRAM) 50 MG tablet Take 50 mg by mouth 3 (three) times daily as needed. 12/26/21   [provider]     Critical care time:  I have personally spent ___45_______ minutes of critical care time, exclusive of time spent on any  procedures, in evaluation and management of this critically ill patient's condition.    Barack Nicodemus MD ([facility]) FOR [facility]

## 2022-03-14 NOTE — Consult Note (Signed)
ANTICOAGULATION CONSULT NOTE - Initial Consult  Pharmacy Consult for heparin infusion - NO BOLUS Indication: DVT  Allergies  Allergen Reactions   Erythromycin Hives and Nausea And Vomiting   Lexapro [Escitalopram Oxalate] Hives    Patient Measurements: Height: 5\' 2"  (157.5 cm) Weight: 66.6 kg (146 lb 13.2 oz) IBW/kg (Calculated) : 50.1 Heparin Dosing Weight: 66.8kg  Vital Signs: Temp: 99.9 F (37.7 C) (12/02 1600) Temp Source: Axillary (12/02 1600) BP: 130/59 (12/02 1700) Pulse Rate: 132 (12/02 1700)  Labs: Recent Labs    03/12/22 0331 03/13/22 0454 03/13/22 1653 03/13/22 2241 03/14/22 0504 03/14/22 0938 03/14/22 1607  HGB 8.9* 8.8*  --   --  8.2*  --   --   HCT 29.1* 29.1*  --   --  27.5*  --   --   PLT 303 273  --   --  268  --   --   APTT  --   --  41*  --   --   --   --   LABPROT  --   --  15.2  --   --   --   --   INR  --   --  1.2  --   --   --   --   HEPARINUNFRC  --   --   --  0.16*  --  0.32 0.34  CREATININE 0.36* <0.30*  --   --  0.39*  --   --   CKTOTAL  --   --   --   --  16*  --   --      Estimated Creatinine Clearance: 68.6 mL/min (A) (by C-G formula based on SCr of 0.39 mg/dL (L)).   Medical History: Past Medical History:  Diagnosis Date   GERD (gastroesophageal reflux disease)    Multiple sclerosis (HCC)     Medications:  PTA: N/A Inpatient: Heparin infusion (12/1 >) Allergies: No AC/APT related allergies  Assessment: 61 yo female presented to ED with SOB when lying flat and some right sided rib and shoulder pain. Chest CT showed acute PE.  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 Status post thrombectomy 11/15. Heparin was held in setting of hemoptysis on 11/19. Patient was assessed by neurology and found to have relatively new brain microhemorrhages. Pharmacy consulted for management of heparin infusion in the setting of DVT without bolus per neurology recommendations.   Date    Time    aPTT/HL         Rate/Comment 1117    2311     0.21                 subtherapeutic; 1150 un/hr .... Removed d/t length of note, see older entry if needed... 1122    0833    0.51                Supratherapeutic 1200>1150 un/hr 1122    1834    0.19                Subtherapeutic 1150>1200 un/hr        1123    0224    0.32                Therapeutic x 1  1124    0513    0.17                SUBtherapeutic 1200 > 1300 un/hr 1124    1200    0.24  SUBtherapeutic 1300 > 1350 un/hr 1124    1913   0.30                 Therapeutic x 1 1125    0059    0.21                SUBtherapeutic @ 1350>1400 un/hr 1125    0755    0.15                SUBtherapeutic 1400 > 1500 un/hr 11/25   1639    0.31                Therapeutic x1 11/25   2329    0.32                Therapeutic x 2  11/26   0628    0.16                Subtherapeutic; 1500>1600 un/hr 11/26   1506    0.28                Subtherapeutic ; 1600 un/hr > 1650 un/hr 11/27   0220    0.33                Therapeutic x 1  11/27   0740    0.38                Therapeutic x 2 12/01 2241 0.16  Subtherapeutic x 1 12/2 0938 0.32  Therapeutic x 1 12/2 1607 0.34  Therapeutic x 2    Goal of Therapy:  Heparin level 0.3-0.5 units/ml (NO BOLUSES)  Monitor platelets by anticoagulation protocol: Yes   Plan:  Continue heparin infusion at rate of 1400 units/hr Check heparin level tomorrow morning with AM labs  CBC daily while on heparin   Selinda Eon Clinical Pharmacist 03/14/2022 5:43 PM

## 2022-03-14 NOTE — Consult Note (Signed)
ANTICOAGULATION CONSULT NOTE - Initial Consult  Pharmacy Consult for heparin infusion - NO BOLUS Indication: DVT  Allergies  Allergen Reactions   Erythromycin Hives and Nausea And Vomiting   Lexapro [Escitalopram Oxalate] Hives    Patient Measurements: Height: 5\' 2"  (157.5 cm) Weight: 66.8 kg (147 lb 4.3 oz) IBW/kg (Calculated) : 50.1 Heparin Dosing Weight: 66.8kg  Vital Signs: Temp: 98.6 F (37 C) (12/02 0000) Temp Source: Axillary (12/02 0000) BP: 110/53 (12/02 0200) Pulse Rate: 143 (12/02 0200)  Labs: Recent Labs    03/11/22 0421 03/12/22 0331 03/13/22 0454 03/13/22 1653 03/13/22 2241  HGB 7.5* 8.9* 8.8*  --   --   HCT 24.9* 29.1* 29.1*  --   --   PLT 322 303 273  --   --   APTT  --   --   --  41*  --   LABPROT  --   --   --  15.2  --   INR  --   --   --  1.2  --   HEPARINUNFRC  --   --   --   --  0.16*  CREATININE 0.40* 0.36* <0.30*  --   --      CrCl cannot be calculated (This lab value cannot be used to calculate CrCl because it is not a number: <0.30).   Medical History: Past Medical History:  Diagnosis Date   GERD (gastroesophageal reflux disease)    Multiple sclerosis (HCC)     Medications:  PTA: N/A Inpatient: Heparin infusion (12/1 >) Allergies: No AC/APT related allergies  Assessment: 61 yo female presented to ED with SOB when lying flat and some right sided rib and shoulder pain. Chest CT showed acute PE.  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 Status post thrombectomy 11/15. Heparin was held in setting of hemoptysis on 11/19. Patient was assessed by neurology and found to have relatively new microhemorrhages. Pharmacy consulted for management of heparin infusion in the setting of DVT without bolus per neurology recommendations.   Date    Time    aPTT/HL         Rate/Comment 1117    2311    0.21                 subtherapeutic; 1150 un/hr .... Removed d/t length of note, see older entry if needed... 1122    0833    0.51                 Supratherapeutic 1200>1150 un/hr 1122    1834    0.19                Subtherapeutic 1150>1200 un/hr        1123    0224    0.32                Therapeutic x 1  1124    0513    0.17                SUBtherapeutic 1200 > 1300 un/hr 1124    1200    0.24                SUBtherapeutic 1300 > 1350 un/hr 1124    1913   0.30                 Therapeutic x 1 1125    0059    0.21  SUBtherapeutic @ 1350>1400 un/hr 1125    0755    0.15                SUBtherapeutic 1400 > 1500 un/hr 11/25   1639    0.31                Therapeutic x1 11/25   2329    0.32                Therapeutic x 2  11/26   0628    0.16                Subtherapeutic; 1500>1600 un/hr 11/26   1506    0.28                Subtherapeutic ; 1600 un/hr > 1650 un/hr 11/27   0220    0.33                Therapeutic x 1  11/27   0740    0.38                Therapeutic x 2  12/01 2241 0.16  Subtherapeutic x 1    Goal of Therapy:  Heparin level 0.3-0.5 units/ml (NO BOLUSES)  Monitor platelets by anticoagulation protocol: Yes   Plan:  Heparin infusion restarted at 1150 units/hr Will increase heparin to 1400 units/hr Recheck HL in 6 hr after rate change. CBC daily while on heparin  Renda Rolls, PharmD, [facility] 03/14/2022 3:21 AM

## 2022-03-14 NOTE — Evaluation (Signed)
Physical Therapy Evaluation Patient Details Name: [name] MRN: [id number] DOB: 1961-02-05 Today's Date: 03/14/2022  History of Present Illness  61 year old female with history of MS, hypertension, depression, anxiety, restless leg syndrome, GERD, who presented to emergency department for chief concerns of shortness of breath. Pt found to be positive for covid with b/l PE and DVT noted.  Now s/p thrombectomy and multiple bronchoscopy procedures. unable to wean from the ventilator. MRI imaging of the brain showed chronic cerebral and cerebellar micro-hemorrhages.  Clinical Impression  Pt consistent MEWS 4-5, HR in the 120s t/o session, pt on vent.  Cleared for PT by MD, goal to initiate HEP for P/AAROM exercises and to teach/educate family.  Pt awake and able to attempt some communication via eye bows/blinking but otherwise quite limited.  PROM/stretching exercises for L>UE, essentially flaccid t/o with possible elbow extension assist on a few reps.  Pt with consistent spasm/tone/clonus responses with LE acts.  Pt to have trach placed tomorrow, hope to be able to encourage HEP with family - difficult case.      Recommendations for follow up therapy are one component of a multi-disciplinary discharge planning process, led by the attending physician.  Recommendations may be updated based on patient status, additional functional criteria and insurance authorization.  Follow Up Recommendations  (TBD per progress)      Assistance Recommended at Discharge Frequent or constant Supervision/Assistance  Patient can return home with the following  Two people to help with walking and/or transfers;Two people to help with bathing/dressing/bathroom;Assistance with cooking/housework;Assistance with feeding    Equipment Recommendations  (TBD)  Recommendations for Other Services       Functional Status Assessment Patient has had a recent decline in their functional status and demonstrates the  ability to make significant improvements in function in a reasonable and predictable amount of time.     Precautions / Restrictions Precautions Precautions: Fall Restrictions Weight Bearing Restrictions: No      Mobility  Bed Mobility               General bed mobility comments: deferred 2/2 vent, flaccidity and resting HR 120-130    Transfers                        Ambulation/Gait                  Stairs            Wheelchair Mobility    Modified Rankin (Stroke Patients Only)       Balance                                             Pertinent Vitals/Pain Pain Assessment Pain Location: seems to have very minimal pain, indicates no pain when asked    Home Living Family/patient expects to be discharged to:: Unsure Living Arrangements: Spouse/significant other Available Help at Discharge: Family;Available 24 hours/day Type of Home: House Home Access: Ramped entrance       Home Layout: One level Home Equipment: Agricultural consultant (2 wheels);Shower seat;Electric scooter      Prior Function Prior Level of Function : Needs assist             Mobility Comments: Pt primarily uses electric scooter in home but does ambulate into bathroom with use of RW ADLs Comments: Min A from significant other  for self care tasks as needed.     Hand Dominance        Extremity/Trunk Assessment   Upper Extremity Assessment Upper Extremity Assessment:  (did appear to have some occsaional minimal (1-2/5) elbow ext with AAROM elbow otherwise flaccid t/o b/l)    Lower Extremity Assessment Lower Extremity Assessment:  (no AROM, clonus with b/l ankle PF PROM, seemingly having some reflexive spasming in h/s and hip ADd b/l with PROM/stretch exercises)       Communication   Communication:  (on vent, able to raise eye brows, very limited head nod/turns AROM)  Cognition Arousal/Alertness: Awake/alert Behavior During Therapy:  (able  to attempt some communication with eye brows, willing to participate despite profound limitations) Overall Cognitive Status: Impaired/Different from baseline                                          General Comments General comments (skin integrity, edema, etc.): Pt disucssion with MD, hope to do some AAROM exercises and eventually teach family appropriate HEP activities    Exercises Other Exercises Other Exercises: PROM exercises for U&LEs:  pt unable to show any AROM strength but did have ability to assist with b/l elbow extension on a few reps with elbow PROM.  unable to grip.  LEs with ankle PF/DF, hip Ab/Ad, heel slides all PROM despite much encouragement (and apparent pt effort) to fo some AAROM.  Clonus/reflexive tone responses with some exercises.  At least 10 of each exercises with consistent cuing and pulsed ROM with attempt to illicit some response   Assessment/Plan    PT Assessment  (Will educate pt, family, nursing on PROM/HEP)  PT Problem List         PT Treatment Interventions      PT Goals (Current goals can be found in the Care Plan section)  Acute Rehab PT Goals Patient Stated Goal: unable to state PT Goal Formulation: Patient unable to participate in goal setting Time For Goal Achievement: 03/27/22 Potential to Achieve Goals: Poor    Frequency       Co-evaluation               AM-PAC PT "6 Clicks" Mobility  Outcome Measure Help needed turning from your back to your side while in a flat bed without using bedrails?: Total Help needed moving from lying on your back to sitting on the side of a flat bed without using bedrails?: Total Help needed moving to and from a bed to a chair (including a wheelchair)?: Total Help needed standing up from a chair using your arms (e.g., wheelchair or bedside chair)?: Total Help needed to walk in hospital room?: Total Help needed climbing 3-5 steps with a railing? : Total 6 Click Score: 6    End of  Session Equipment Utilized During Treatment: Oxygen (vent) Activity Tolerance: Other (comment) (all but flaccid t/o U&LEs) Patient left: in bed;with call bell/phone within reach Nurse Communication: Mobility status PT Visit Diagnosis: Muscle weakness (generalized) (M62.81);Other symptoms and signs involving the nervous system (R29.898)    Time: 1535-1600 PT Time Calculation (min) (ACUTE ONLY): 25 min   Charges:   PT Evaluation $PT Eval Low Complexity: 1 Low          Malachi Pro, DPT 03/14/2022, 5:35 PM

## 2022-03-14 NOTE — Consult Note (Signed)
ANTICOAGULATION CONSULT NOTE - Initial Consult  Pharmacy Consult for heparin infusion - NO BOLUS Indication: DVT  Allergies  Allergen Reactions   Erythromycin Hives and Nausea And Vomiting   Lexapro [Escitalopram Oxalate] Hives    Patient Measurements: Height: 5\' 2"  (157.5 cm) Weight: 66.6 kg (146 lb 13.2 oz) IBW/kg (Calculated) : 50.1 Heparin Dosing Weight: 66.8kg  Vital Signs: Temp: 99.9 F (37.7 C) (12/02 0800) Temp Source: Axillary (12/02 0800) BP: 116/53 (12/02 1000) Pulse Rate: 114 (12/02 1000)  Labs: Recent Labs    03/12/22 0331 03/13/22 0454 03/13/22 1653 03/13/22 2241 03/14/22 0504 03/14/22 0938  HGB 8.9* 8.8*  --   --  8.2*  --   HCT 29.1* 29.1*  --   --  27.5*  --   PLT 303 273  --   --  268  --   APTT  --   --  41*  --   --   --   LABPROT  --   --  15.2  --   --   --   INR  --   --  1.2  --   --   --   HEPARINUNFRC  --   --   --  0.16*  --  0.32  CREATININE 0.36* <0.30*  --   --  0.39*  --   CKTOTAL  --   --   --   --  16*  --      Estimated Creatinine Clearance: 68.6 mL/min (A) (by C-G formula based on SCr of 0.39 mg/dL (L)).   Medical History: Past Medical History:  Diagnosis Date   GERD (gastroesophageal reflux disease)    Multiple sclerosis (HCC)     Medications:  PTA: N/A Inpatient: Heparin infusion (12/1 >) Allergies: No AC/APT related allergies  Assessment: 61 yo female presented to ED with SOB when lying flat and some right sided rib and shoulder pain. Chest CT showed acute PE.  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 Status post thrombectomy 11/15. Heparin was held in setting of hemoptysis on 11/19. Patient was assessed by neurology and found to have relatively new brain microhemorrhages. Pharmacy consulted for management of heparin infusion in the setting of DVT without bolus per neurology recommendations.   Date    Time    aPTT/HL         Rate/Comment 1117    2311    0.21                 subtherapeutic; 1150 un/hr .... Removed  d/t length of note, see older entry if needed... 1122    0833    0.51                Supratherapeutic 1200>1150 un/hr 1122    1834    0.19                Subtherapeutic 1150>1200 un/hr        1123    0224    0.32                Therapeutic x 1  1124    0513    0.17                SUBtherapeutic 1200 > 1300 un/hr 1124    1200    0.24                SUBtherapeutic 1300 > 1350 un/hr 1124    1913   0.30  Therapeutic x 1 1125    0059    0.21                SUBtherapeutic @ 1350>1400 un/hr 1125    0755    0.15                SUBtherapeutic 1400 > 1500 un/hr 11/25   1639    0.31                Therapeutic x1 11/25   2329    0.32                Therapeutic x 2  11/26   0628    0.16                Subtherapeutic; 1500>1600 un/hr 11/26   1506    0.28                Subtherapeutic ; 1600 un/hr > 1650 un/hr 11/27   0220    0.33                Therapeutic x 1  11/27   0740    0.38                Therapeutic x 2 12/01 2241 0.16  Subtherapeutic x 1 12/2 0938 0.32  therapeuticx1    Goal of Therapy:  Heparin level 0.3-0.5 units/ml (NO BOLUSES)  Monitor platelets by anticoagulation protocol: Yes   Plan:  12/2 0938 HL=0.32  Therapeutic x1 Will continue  heparin at 1400 units/hr Check confirmatory HL in 6 hr  CBC daily while on heparin  Bari Mantis PharmD Clinical Pharmacist 03/14/2022

## 2022-03-15 ENCOUNTER — Inpatient Hospital Stay: Payer: Medicare Other

## 2022-03-15 DIAGNOSIS — A419 Sepsis, unspecified organism: Secondary | ICD-10-CM | POA: Diagnosis not present

## 2022-03-15 DIAGNOSIS — I2699 Other pulmonary embolism without acute cor pulmonale: Secondary | ICD-10-CM | POA: Diagnosis not present

## 2022-03-15 DIAGNOSIS — J9601 Acute respiratory failure with hypoxia: Secondary | ICD-10-CM | POA: Diagnosis not present

## 2022-03-15 LAB — CBC
HCT: 26.8 % — ABNORMAL LOW (ref 36.0–46.0)
Hemoglobin: 7.9 g/dL — ABNORMAL LOW (ref 12.0–15.0)
MCH: 28.1 pg (ref 26.0–34.0)
MCHC: 29.5 g/dL — ABNORMAL LOW (ref 30.0–36.0)
MCV: 95.4 fL (ref 80.0–100.0)
Platelets: 298 10*3/uL (ref 150–400)
RBC: 2.81 MIL/uL — ABNORMAL LOW (ref 3.87–5.11)
RDW: 19.6 % — ABNORMAL HIGH (ref 11.5–15.5)
WBC: 23 10*3/uL — ABNORMAL HIGH (ref 4.0–10.5)
nRBC: 0 % (ref 0.0–0.2)

## 2022-03-15 LAB — BLOOD GAS, ARTERIAL
Acid-Base Excess: 13.2 mmol/L — ABNORMAL HIGH (ref 0.0–2.0)
Bicarbonate: 36.7 mmol/L — ABNORMAL HIGH (ref 20.0–28.0)
FIO2: 35 %
O2 Content: 50 L/min
O2 Saturation: 99.8 %
Patient temperature: 37
pCO2 arterial: 41 mmHg (ref 32–48)
pH, Arterial: 7.56 — ABNORMAL HIGH (ref 7.35–7.45)
pO2, Arterial: 95 mmHg (ref 83–108)

## 2022-03-15 LAB — GLUCOSE, CAPILLARY
Glucose-Capillary: 109 mg/dL — ABNORMAL HIGH (ref 70–99)
Glucose-Capillary: 116 mg/dL — ABNORMAL HIGH (ref 70–99)
Glucose-Capillary: 119 mg/dL — ABNORMAL HIGH (ref 70–99)
Glucose-Capillary: 120 mg/dL — ABNORMAL HIGH (ref 70–99)
Glucose-Capillary: 125 mg/dL — ABNORMAL HIGH (ref 70–99)
Glucose-Capillary: 151 mg/dL — ABNORMAL HIGH (ref 70–99)

## 2022-03-15 LAB — HEPARIN LEVEL (UNFRACTIONATED): Heparin Unfractionated: 0.38 IU/mL (ref 0.30–0.70)

## 2022-03-15 MED ORDER — APIXABAN 5 MG PO TABS
5.0000 mg | ORAL_TABLET | Freq: Two times a day (BID) | ORAL | Status: DC
  Administered 2022-03-15 – 2022-03-20 (×10): 5 mg
  Filled 2022-03-15 (×10): qty 1

## 2022-03-15 MED ORDER — ORAL CARE MOUTH RINSE: 15.0000 mL | OROMUCOSAL | Status: DC | PRN

## 2022-03-15 MED ORDER — ORAL CARE MOUTH RINSE
15.0000 mL | OROMUCOSAL | Status: DC
  Administered 2022-03-15 – 2022-03-20 (×20): 15 mL via OROMUCOSAL

## 2022-03-15 NOTE — Progress Notes (Signed)
Pulmonary and Critical care    NAME:  [name], MRN:  [id number], DOB:  1960/09/18, LOS: 19 ADMISSION DATE:  02/24/2022,     History of Present Illness:  Patient was started on spontaneous breathing trial today she did well and subsequently was transition to T-piece through the endotracheal tube.  She did that well for the next 1 hour she has been coherent.  Appropriately nodding her neck and making appropriate facial gestures.  I spoke to patient her sister as well as significant other and the patient's nurse at bedside.  We discussed about extubation patient actually wants to be a full code if extubation fails.  We also discussed transitioning to NG tube before extubation.  Patient is agreeable with this plan.  Patient sister also agreeable with plan.         03/15/2022   12:00 PM 03/15/2022   11:00 AM 03/15/2022   10:00 AM  Vitals with BMI  Systolic 144 131 785  Diastolic 78 74 71  Pulse 125 118 117    Vent Mode: PRVC FiO2 (%):  [35 %] 35 % Set Rate:  [16 bmp] 16 bmp Vt Set:  [440 mL] 440 mL PEEP:  [5 cmH20] 5 cmH20 Plateau Pressure:  [17 cmH20] 17 cmH20   Examination: General: Awake comfortable tracking with neck movements and eye movements, patient was able to move tongue out while on ventilator. HENT: Mild pallor no JVD Lungs: Lungs air entry was heard bilaterally crackles at the bases Cardiovascular: S1-S2 heard no murmurs Abdomen: Soft nontender bowel sounds heard Extremities: No edema or cyanosis Neuro: Did not move extremities to commands or spontaneously.    Assessment & Plan:  1.  Acute hypoxic respiratory failure 2.  Bilateral pulmonary embolism 3.  Pulmonary hemorrhage 4. Multiple sclerosis  5.  Metabolic encephalopathy. 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  Failure to wean from ventilator/ventilator dependence 8.  Recurrent fever 9.Cerebral microhemorrhages 10. DVT 11, acute on chronic anemia   Plan Transition to Eliquis tonight discontinue heparin drip Patient  extubated to Optiflow.   Speech therapy  Continue formoterol and Yupelri Antibiotics have been stopped NG tube in place x-ray seen.  Can be started on tube feeds later in the day Best Practice (right click and "Reselect all SmartList Selections" daily)   Diet/type: tubefeeds DVT prophylaxis: Eliquis  GI prophylaxis: PPI Lines: N/A Foley:  Yes, and it is still needed Code Status: Full code Labs   CBC: Recent Labs  Lab 03/08/22 2105 03/09/22 0220 03/09/22 1943 03/10/22 0621 03/11/22 0421 03/12/22 0331 03/13/22 0454 03/14/22 0504 03/15/22 0335  WBC 39.5*   < > 38.1*   < > 24.7* 25.7* 25.7* 26.2* 23.0*  NEUTROABS 25.9*  --  26.3*  --   --  15.3* 15.7*  --   --   HGB 7.8*   < > 7.7*   < > 7.5* 8.9* 8.8* 8.2* 7.9*  HCT 25.4*   < > 26.0*   < > 24.9* 29.1* 29.1* 27.5* 26.8*  MCV 93.7   < > 95.6   < > 93.3 93.9 94.5 93.2 95.4  PLT 241   < > 308   < > 322 303 273 268 298   < > = values in this interval not displayed.    Basic Metabolic Panel: Recent Labs  Lab 03/09/22 0220 03/10/22 0621 03/11/22 0421 03/12/22 0331 03/13/22 0454 03/14/22 0504  NA 141 142 137 139 138 137  K 3.4* 3.6 3.8 3.8 3.6 4.4  CL 103 109 107  102 101 99  CO2 29 27 24 28 29 31   GLUCOSE 132* 144* 130* 125* 123* 145*  BUN 24* 25* 23* 20 21* 27*  CREATININE 0.40* 0.35* 0.40* 0.36* <0.30* 0.39*  CALCIUM 8.7* 8.6* 8.9 9.0 8.7* 8.8*  MG 2.2 2.2 2.3 2.2 2.2  --   PHOS 2.2* 2.7 3.2 3.0 2.2* 3.2   GFR: Estimated Creatinine Clearance: 68.7 mL/min (A) (by C-G formula based on SCr of 0.39 mg/dL (L)). Recent Labs  Lab 03/12/22 0331 03/13/22 0454 03/14/22 0504 03/15/22 0335  WBC 25.7* 25.7* 26.2* 23.0*    Liver Function Tests: No results for input(s): "AST", "ALT", "ALKPHOS", "BILITOT", "PROT", "ALBUMIN" in the last 168 hours. No results for input(s): "LIPASE", "AMYLASE" in the last 168 hours. No results for input(s): "AMMONIA" in the last 168 hours.  ABG    Component Value Date/Time   PHART 7.59  (H) 03/07/2022 0848   PCO2ART 43 03/07/2022 0848   PO2ART 87 03/07/2022 0848   HCO3 41.3 (H) 03/07/2022 0848   ACIDBASEDEF 4.0 (H) 02/26/2022 0307   O2SAT 98.6 03/07/2022 0848     Coagulation Profile: Recent Labs  Lab 03/13/22 1653  INR 1.2    Cardiac Enzymes: Recent Labs  Lab 03/14/22 0504  CKTOTAL 16*    HbA1C: No results found for: "HGBA1C"  CBG: Recent Labs  Lab 03/14/22 1918 03/14/22 2313 03/15/22 0352 03/15/22 0737 03/15/22 1130  GLUCAP 135* 134* 119* 116* 120*    RADIOLOGY Abdomen x-ray Seen tube in position Review of Systems:   Denies pain denies shortness of breath   Past Medical History:  She,  has a past medical history of GERD (gastroesophageal reflux disease) and Multiple sclerosis (HCC).   Surgical History:   Past Surgical History:  Procedure Laterality Date   ESOPHAGOGASTRODUODENOSCOPY (EGD) WITH PROPOFOL N/A 09/11/2021   Procedure: ESOPHAGOGASTRODUODENOSCOPY (EGD) WITH PROPOFOL;  Surgeon: 11/11/2021, DO;  Location: [facility] ENDOSCOPY;  Service: Gastroenterology;  Laterality: N/A;   PULMONARY THROMBECTOMY Bilateral 02/25/2022   Procedure: PULMONARY THROMBECTOMY;  Surgeon: 02/27/2022, MD;  Location: [facility] INVASIVE CV LAB;  Service: Cardiovascular;  Laterality: Bilateral;   TUBAL LIGATION       Social History:   reports that she has never smoked. She has never used smokeless tobacco. She reports current alcohol use. She reports that she does not use drugs.   Family History:  Her family history is not on file.   Allergies Allergies  Allergen Reactions   Erythromycin Hives and Nausea And Vomiting   Lexapro [Escitalopram Oxalate] Hives     Home Medications  Prior to Admission medications   Medication Sig Start Date End Date Taking? Authorizing Provider  cyanocobalamin 1000 MCG tablet Take by mouth.   Yes [provider]  DULoxetine (CYMBALTA) 30 MG capsule Take 30 mg by mouth daily. 06/25/21  Yes [provider]  gabapentin (NEURONTIN) 400 MG capsule Take Gabapentin 400 mg in the morning, 400 mg in the afternoon, and 800 mg at night 12/29/21  Yes [provider]  omeprazole (PRILOSEC) 20 MG capsule Take 20 mg by mouth daily. 06/10/21  Yes [provider]  propranolol (INDERAL) 20 MG tablet Take 1 tablet by mouth 2 (two) times daily. 12/16/21  Yes [provider]  rOPINIRole (REQUIP) 0.25 MG tablet Take 0.25 mg at night for 1 week, then increase to 0.5 mg at night 05/29/21  Yes [provider]  tolterodine (DETROL LA) 2 MG 24 hr capsule Take 2 mg by  mouth 2 (two) times daily. 12/29/21 12/29/22 Yes [provider]  ascorbic acid (VITAMIN C) 500 MG tablet Take by mouth.    [provider]  Cholecalciferol 50 MCG (2000 UT) CAPS Take by mouth.    [provider]  cyclobenzaprine (FLEXERIL) 10 MG tablet Take 10 mg by mouth 2 (two) times daily as needed. 11/26/21   [provider]  dalfampridine 10 MG TB12 Take 1 tablet by mouth every 12 (twelve) hours. 12/29/21   [provider]  meloxicam (MOBIC) 15 MG tablet Take 1 tablet (15 mg total) by mouth daily. 08/23/21 08/23/22  Cuthriell, Delorise Royals, PA-C  methylPREDNISolone (MEDROL DOSEPAK) 4 MG TBPK tablet Take by mouth. 11/26/21   [provider]  naproxen (NAPROSYN) 500 MG tablet Take by mouth.    [provider]  nitrofurantoin, macrocrystal-monohydrate, (MACROBID) 100 MG capsule Take 1 capsule (100 mg total) by mouth 2 (two) times daily. 01/19/22   Immordino, Jeannett Senior, FNP  pregabalin (LYRICA) 75 MG capsule Take 75 mg by mouth 3 (three) times daily. 08/18/21   [provider]  traMADol (ULTRAM) 50 MG tablet Take 50 mg by mouth 3 (three) times daily as needed. 12/26/21   [provider]     Critical care time:  I have personally spent _______55___ minutes of critical care time, exclusive of time spent on any  procedures, in evaluation and management of this  critically ill patient's condition.    Jeremia Groot MD ([facility]) FOR [facility]

## 2022-03-15 NOTE — Progress Notes (Signed)
MD at bedside spoke with pt, family and sister Ruth(POA), explained to family and pt that we will attempt to extubate but if pt does not do well then she will be re intubated and then trached as planned, pt and family agrees, pt wants to be converted to FULL CODE. Pt answering questions appropriately by nodding head and mouthing words.

## 2022-03-15 NOTE — Progress Notes (Signed)
SLP Cancellation Note  Patient Details Name: [name] MRN: [id number] DOB: 1960/05/06   Cancelled treatment:       Reason Eval/Treat Not Completed: Patient not medically ready (SLP consult received and appreciated. Chart review completed. Pt chart review, pt extubated at 1150 following prolonged intubation (02/25/22). Pt currently on 40L/min O2 via HFNC. Pt is at increased risk for reintubation per chart review. Will defer clinical swallowing evaluation to next date given the above. RN aware.  Clyde Canterbury, M.S., CCC-SLP Speech-Language Pathologist [facility] [phone] Arnette Felts)   Woodroe Chen 03/15/2022, 12:54 PM

## 2022-03-15 NOTE — Progress Notes (Signed)
PHARMACY CONSULT NOTE  Pharmacy Consult for Electrolyte Monitoring and Replacement   Recent Labs: Potassium (mmol/L)  Date Value  03/14/2022 4.4   Magnesium (mg/dL)  Date Value  65/68/1275 2.2   Calcium (mg/dL)  Date Value  17/00/1749 8.8 (L)   Albumin (g/dL)  Date Value  44/96/7591 2.4 (L)   Phosphorus (mg/dL)  Date Value  63/84/6659 3.2   Sodium (mmol/L)  Date Value  03/14/2022 137   Assessment: 61 yo female presented to ED with SOB when lying flat and some right sided rib and shoulder pain.  Chest CT showed acute PE.  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 Pharmacy is asked to follow and replace electrolytes while in CCU.  Nutrition: Tube feeds at 85ml/h, FWF 100 q6h  Goal of Therapy:  Electrolytes within normal limits  Plan:  --no BMP, no electrolyte replacement at this time --Follow-up electrolytes if ordered with AM labs   Lanay Zinda A 03/15/2022 9:29 AM

## 2022-03-15 NOTE — Progress Notes (Signed)
Pt extubated by RT per order, tolerated extubation well, pt placed on heated hi flow 40l O2, family at bedside talking to pt, pt alert and oriented and verbal. VS wnl as charted.

## 2022-03-15 NOTE — Progress Notes (Signed)
Extubation order written. Cuf leak noted. Patient extubated and placed on HFNC 50L @ 35%. Will continue to monitor.

## 2022-03-15 NOTE — Consult Note (Signed)
ANTICOAGULATION CONSULT NOTE   Pharmacy Consult for heparin infusion - NO BOLUS Indication: DVT  Allergies  Allergen Reactions   Erythromycin Hives and Nausea And Vomiting   Lexapro [Escitalopram Oxalate] Hives    Patient Measurements: Height: 5\' 2"  (157.5 cm) Weight: 66.6 kg (146 lb 13.2 oz) IBW/kg (Calculated) : 50.1 Heparin Dosing Weight: 66.8kg  Vital Signs: Temp: 100 F (37.8 C) (12/03 0400) Temp Source: Axillary (12/03 0400) BP: 135/50 (12/03 0400) Pulse Rate: 140 (12/03 0400)  Labs: Recent Labs    03/13/22 0454 03/13/22 1653 03/13/22 2241 03/14/22 0504 03/14/22 0938 03/14/22 1607 03/15/22 0335  HGB 8.8*  --   --  8.2*  --   --  7.9*  HCT 29.1*  --   --  27.5*  --   --  26.8*  PLT 273  --   --  268  --   --  298  APTT  --  41*  --   --   --   --   --   LABPROT  --  15.2  --   --   --   --   --   INR  --  1.2  --   --   --   --   --   HEPARINUNFRC  --   --    < >  --  0.32 0.34 0.38  CREATININE <0.30*  --   --  0.39*  --   --   --   CKTOTAL  --   --   --  16*  --   --   --    < > = values in this interval not displayed.     Estimated Creatinine Clearance: 68.6 mL/min (A) (by C-G formula based on SCr of 0.39 mg/dL (L)).   Medical History: Past Medical History:  Diagnosis Date   GERD (gastroesophageal reflux disease)    Multiple sclerosis (HCC)     Medications:  PTA: N/A Inpatient: Heparin infusion (12/1 >) Allergies: No AC/APT related allergies  Assessment: 60 yo female presented to ED with SOB when lying flat and some right sided rib and shoulder pain. Chest CT showed acute PE.  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 Status post thrombectomy 11/15. Heparin was held in setting of hemoptysis on 11/19. Patient was assessed by neurology and found to have relatively new brain microhemorrhages. Pharmacy consulted for management of heparin infusion in the setting of DVT without bolus per neurology recommendations.   Date    Time    aPTT/HL          Rate/Comment 1117    2311    0.21                 subtherapeutic; 1150 un/hr .... Removed d/t length of note, see older entry if needed... 1122    0833    0.51                Supratherapeutic 1200>1150 un/hr 1122    1834    0.19                Subtherapeutic 1150>1200 un/hr        1123    0224    0.32                Therapeutic x 1  1124    0513    0.17                SUBtherapeutic 1200 > 1300 un/hr  1124    1200    0.24                SUBtherapeutic 1300 > 1350 un/hr 1124    1913   0.30                 Therapeutic x 1 1125    0059    0.21                SUBtherapeutic @ 1350>1400 un/hr 1125    0755    0.15                SUBtherapeutic 1400 > 1500 un/hr 11/25   1639    0.31                Therapeutic x1 11/25   2329    0.32                Therapeutic x 2  11/26   0628    0.16                Subtherapeutic; 1500>1600 un/hr 11/26   1506    0.28                Subtherapeutic ; 1600 un/hr > 1650 un/hr 11/27   0220    0.33                Therapeutic x 1  11/27   0740    0.38                Therapeutic x 2 12/01 2241 0.16  Subtherapeutic x 1 12/2 0938 0.32  Therapeutic x 1 12/2 1607 0.34  Therapeutic x 2 12/03 0352 0.38  Therapeutic x 3    Goal of Therapy:  Heparin level 0.3-0.5 units/ml (NO BOLUSES)  Monitor platelets by anticoagulation protocol: Yes   Plan:  Continue heparin infusion at rate of 1400 units/hr Recheck HL daily w/ AM labs while therapeutic CBC daily while on heparin  Otelia Sergeant, PharmD, [facility] 03/15/2022 5:41 AM

## 2022-03-16 ENCOUNTER — Ambulatory Visit: Payer: Medicare Other | Admitting: Dermatology

## 2022-03-16 DIAGNOSIS — I2699 Other pulmonary embolism without acute cor pulmonale: Secondary | ICD-10-CM | POA: Diagnosis not present

## 2022-03-16 DIAGNOSIS — R509 Fever, unspecified: Secondary | ICD-10-CM | POA: Diagnosis not present

## 2022-03-16 LAB — GLUCOSE, CAPILLARY
Glucose-Capillary: 123 mg/dL — ABNORMAL HIGH (ref 70–99)
Glucose-Capillary: 123 mg/dL — ABNORMAL HIGH (ref 70–99)
Glucose-Capillary: 145 mg/dL — ABNORMAL HIGH (ref 70–99)
Glucose-Capillary: 122 mg/dL — ABNORMAL HIGH (ref 70–99)
Glucose-Capillary: 147 mg/dL — ABNORMAL HIGH (ref 70–99)
Glucose-Capillary: 132 mg/dL — ABNORMAL HIGH (ref 70–99)

## 2022-03-16 LAB — BCR-ABL1 FISH
Cells Analyzed: 200
Cells Counted: 200

## 2022-03-16 LAB — CBC
HCT: 28.7 % — ABNORMAL LOW (ref 36.0–46.0)
Hemoglobin: 8.6 g/dL — ABNORMAL LOW (ref 12.0–15.0)
MCH: 28 pg (ref 26.0–34.0)
MCHC: 30 g/dL (ref 30.0–36.0)
MCV: 93.5 fL (ref 80.0–100.0)
Platelets: 359 10*3/uL (ref 150–400)
RBC: 3.07 MIL/uL — ABNORMAL LOW (ref 3.87–5.11)
RDW: 19.2 % — ABNORMAL HIGH (ref 11.5–15.5)
WBC: 22 10*3/uL — ABNORMAL HIGH (ref 4.0–10.5)
nRBC: 0.2 % (ref 0.0–0.2)

## 2022-03-16 LAB — PTT FACTOR INHIBITOR (MIXING STUDY): aPTT: 26.4 s (ref 22.9–30.2)

## 2022-03-16 MED ORDER — FREE WATER
170.0000 mL | Status: DC
  Administered 2022-03-16 – 2022-03-17 (×6): 170 mL

## 2022-03-16 MED ORDER — OSMOLITE 1.5 CAL PO LIQD
1000.0000 mL | ORAL | Status: DC
  Administered 2022-03-16 – 2022-03-19 (×4): 1000 mL

## 2022-03-16 MED ORDER — METOPROLOL TARTRATE 5 MG/5ML IV SOLN
2.5000 mg | Freq: Once | INTRAVENOUS | Status: AC
  Administered 2022-03-16: 2.5 mg via INTRAVENOUS
  Filled 2022-03-16: qty 5

## 2022-03-16 NOTE — Progress Notes (Addendum)
Nutrition Follow-up  DOCUMENTATION CODES:   Not applicable  INTERVENTION:   -Continue TF via NGT:   Osmolite 1.5 @ 55 ml/hr   60 ml Prosource TF daily  170 ml free water flush every 4 hours    Tube feeding regimen provides 2060 kcal (100% of needs), 103 grams of protein, and 1003 ml of H2O. Total free water: 2023 ml daily    -1 packet Juven BID via tube, each packet provides 95 calories, 2.5 grams of protein (collagen), and 9.8 grams of carbohydrate (3 grams sugar); also contains 7 grams of L-arginine and L-glutamine, 300 mg vitamin C, 15 mg vitamin E, 1.2 mcg vitamin B-12, 9.5 mg zinc, 200 mg calcium, and 1.5 g  Calcium Beta-hydroxy-Beta-methylbutyrate to support wound healing   NUTRITION DIAGNOSIS:   Inadequate oral intake related to inability to eat (pt sedated and ventilated) as evidenced by NPO status.  Ongoing  GOAL:   Patient will meet greater than or equal to 90% of their needs  Met with TF  MONITOR:   Diet advancement, TF tolerance  REASON FOR ASSESSMENT:   Ventilator    ASSESSMENT:   61 y/o female with h/o MS, GERD and RLS who is admitted with bilateral PEs s/p thrombectomy 11/15. Pt also with new PNA.  12/3- extubated, NGT (tip of tube below diaphragm in stomach, side port just past GE function)  Reviewed I/O's: -1 L x 24 hours and -402 ml since 03/02/22  UOP: 2 L x 24 hours   Case discussed with RN, MD, and during ICU rounds. Pt extubated yesterday and NGT was placed for nutrition. Pt with delayed neurological response (may be due to hearing impairment?). Pt is alert and awake and at high risk for intubation. Pt also with thick secretions. Pt alert and awake. Plan for PT and OT consult daily.   Per RN, pt tolerating TF well.   Case discussed with SLP; plan to continue NPO status. SLP agrees that pt will likely continue to needs nutritional support if aggressive measured are desired, even if pt is placed on a diet.   Per MD, pt is a good LTACH  candidate. TOC to investigate.   Medications reviewed and include vitamin B-12.   Labs reviewed: CBGS: 123-145 (inpatient orders for glycemic control are none).    Diet Order:   Diet Order             Diet NPO time specified  Diet effective now                   EDUCATION NEEDS:   No education needs have been identified at this time  Skin:  Skin Assessment: Skin Integrity Issues: Skin Integrity Issues:: DTI, Unstageable DTI: perineum Unstageable: rt foot  Last BM:  03/15/22 (type 7)  Height:   Ht Readings from Last 1 Encounters:  02/26/22 _0  (1.575 m)    Weight:   Wt Readings from Last 1 Encounters:  03/16/22 65.9 kg    Ideal Body Weight:  50 kg  BMI:  Body mass index is 26.57 kg/m.  Estimated Nutritional Needs:   Kcal:  2000-2200  Protein:  100-115 grams  Fluid:  > 2 L    Loistine Chance, RD, LDN, [place] Registered Dietitian II Certified Diabetes Care and Education Specialist Please refer to [facility] for RD and/or RD on-call/weekend/after hours pager

## 2022-03-16 NOTE — TOC Progression Note (Signed)
Transition of Care [facility]) - Progression Note    Patient Details  Name: Nakira Litzau MRN: [id number] Date of Birth: 1960-05-20  [facility]) CM/SW Contact  Allayne Butcher, RN Phone Number: 03/16/2022, 1:59 PM  Clinical Narrative:    Patient was extubated yesterday currently on [place] 2L.  Critical Care MD believes patient is a great candidate for LTAC.  RNCM spoke with patient's sister in law, Windell Moulding to discuss discharge planning.  Windell Moulding is interested in LTAC.  Offered choice of [facility] or [facility]  in [place].  Windell Moulding agreed to speak with both hospital liaison's.  Carollee Herter with [facility] and Irving Burton with [facility] will reach out to Windell Moulding to talk about their hospitals.      Expected Discharge Plan: Long Term Acute Care (LTAC) Barriers to Discharge: Continued Medical Work up  Expected Discharge Plan and Services Expected Discharge Plan: Long Term Acute Care (LTAC)   Discharge Planning Services: CM Consult Post Acute Care Choice: Long Term Acute Care (LTAC) Living arrangements for the past 2 months: Single Family Home                 DME Arranged: N/A DME Agency: NA       HH Arranged: NA HH Agency: NA         Social Determinants of Health (SDOH) Interventions    Readmission Risk Interventions     No data to display

## 2022-03-16 NOTE — Progress Notes (Signed)
PHARMACY CONSULT NOTE  Pharmacy Consult for Electrolyte Monitoring and Replacement   Recent Labs: Potassium (mmol/L)  Date Value  03/14/2022 4.4   Magnesium (mg/dL)  Date Value  48/54/6270 2.2   Calcium (mg/dL)  Date Value  35/00/9381 8.8 (L)   Albumin (g/dL)  Date Value  82/99/3716 2.4 (L)   Phosphorus (mg/dL)  Date Value  96/78/9381 3.2   Sodium (mmol/L)  Date Value  03/14/2022 137   Assessment: 61 yo female presented to ED with SOB when lying flat and some right sided rib and shoulder pain.  Chest CT showed acute PE.  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 Pharmacy is asked to follow and replace electrolytes while in CCU.  Nutrition: Tube feeds at 72ml/h, FWF q6h  Goal of Therapy:  Electrolytes within normal limits  Plan:  --no BMP, no electrolyte replacement at this time --Follow-up electrolytes if ordered with AM labs   Lowella Bandy 03/16/2022 1:57 PM

## 2022-03-16 NOTE — Progress Notes (Signed)
OT Cancellation Note  Patient Details Name: Karrisa Didio MRN: [id number] DOB: [birth year]   Cancelled Treatment:    Reason Eval/Treat Not Completed: Medical issues which prohibited therapy. Pt with red mews all morning, 3 currently. Pt with elevated HR, 130 at rest. Will hold OT at this time and re-attempt as able.   Gerrie Nordmann 03/16/2022, 3:24 PM

## 2022-03-16 NOTE — Progress Notes (Addendum)
ID Pt extubated on 12/3 Off antibiotics since Friday and no fever Not clearing secretions well  Patient Vitals for the past 24 hrs:  BP Temp Temp src Pulse Resp SpO2 Weight  03/16/22 1100 117/72 -- -- -- (!) 26 -- --  03/16/22 1000 119/77 -- -- (!) 130 (!) 29 94 % --  03/16/22 0900 121/80 -- -- (!) 142 (!) 25 94 % --  03/16/22 0814 133/70 -- -- (!) 141 (!) 26 94 % --  03/16/22 0800 133/70 98.4 F (36.9 C) Axillary (!) 142 (!) 27 93 % --  03/16/22 0700 136/72 -- -- (!) 141 20 98 % --  03/16/22 0600 (!) 140/75 -- -- (!) 138 (!) 27 95 % --  03/16/22 0500 136/72 -- -- (!) 131 (!) 23 94 % --  03/16/22 0446 -- -- -- -- -- -- 65.9 kg  03/16/22 0400 (!) 148/62 99.6 F (37.6 C) Axillary (!) 146 17 93 % --  03/16/22 0300 (!) 128/93 -- -- (!) 141 18 96 % --  03/16/22 0200 136/72 -- -- (!) 142 (!) 23 93 % --  03/16/22 0100 (!) 141/70 -- -- (!) 135 (!) 27 98 % --  03/16/22 0000 138/73 99.6 F (37.6 C) Axillary (!) 128 (!) 23 97 % --  03/15/22 2300 139/68 -- -- -- (!) 24 -- --  03/15/22 2200 121/67 -- -- (!) 129 (!) 27 98 % --  03/15/22 2100 127/68 -- -- (!) 126 20 99 % --  03/15/22 2017 -- -- -- (!) 135 (!) 27 97 % --  03/15/22 2000 134/68 -- -- (!) 134 (!) 29 94 % --  03/15/22 1900 128/73 97.8 F (36.6 C) Oral (!) 124 (!) 30 96 % --  03/15/22 1800 133/64 -- -- (!) 115 (!) 22 94 % --  03/15/22 1700 (!) 146/64 -- -- (!) 135 (!) 22 96 % --  03/15/22 1600 135/72 99.8 F (37.7 C) Axillary (!) 127 -- 99 % --  03/15/22 1500 133/72 -- -- (!) 122 19 98 % --  03/15/22 1448 -- -- -- -- -- 98 % --  03/15/22 1400 129/76 -- -- (!) 117 (!) 30 93 % --  03/15/22 1300 136/76 -- -- (!) 124 16 95 % --   Lethargic But responds  Chest b/l air entry- crepts Abd soft Hs s1s2-Tachycardia Cns cannot be assessed  Labs    Latest Ref Rng & Units 03/16/2022    3:24 AM 03/15/2022    3:35 AM 03/14/2022    5:04 AM  CBC  WBC 4.0 - 10.5 K/uL 22.0  23.0  26.2   Hemoglobin 12.0 - 15.0 g/dL 8.6  7.9  8.2    Hematocrit 36.0 - 46.0 % 28.7  26.8  27.5   Platelets 150 - 400 K/uL 359  298  268        Latest Ref Rng & Units 03/14/2022    5:04 AM 03/13/2022    4:54 AM 03/12/2022    3:31 AM  CMP  Glucose 70 - 99 mg/dL 270  350  093   BUN 6 - 20 mg/dL 27  21  20    Creatinine 0.44 - 1.00 mg/dL  8.18  <2.99   Sodium 135 - 145 mmol/L 137  138  139   Potassium 3.5 - 5.1 mmol/L 4.4  3.6  3.8   Chloride 98 - 111 mmol/L 99  101  102   CO2 22 - 32 mmol/L 31  29  28   Calcium 8.9 - 10.3 mg/dL 8.8  8.7  9.0     Micto 11/21 BC / tracheal culture NG 11/26 tracheal culture neg 11/27 BC - NG Cdiff neg   Impression/recommendation  61 yr female with h/o MS wheel chair bound presented with SOB and rt sided chest pain with recent covid infection   Acute PE b/l with mod- severe clot burden with strain Underwent thrombectomy Complicated by severe pulmonary hemorrhage requiring bronchoscopy to clear the clots on 02/25/22 Ongoing fever and leucocytosis-  had not  responded to antibiotics-until it was changed to  meropenem and linezolid- was afebrile \\for  24 hrs  but started fever again  and also leucocytosis fluctuating,. Stopped antibiotics 03/13/22- no fever since then  (Was the fever due to pulmonary infarct? ?? ARDS ( as per pulmonologist her resp status improving ? Aspiration pneumonitis   rule out acute myelomonocytic leukemia causing fever -onc has sent many tets Leuccoytosis has improved- arund 22K  Or drug fever?/  CNS micro hemorrhages brain  Acute hypoxic resp failure- s/p extubation  Discussed the management with care team ID will sign off - call if needed

## 2022-03-16 NOTE — Progress Notes (Signed)
NAME:  [name], MRN:  [id number], DOB:  01-23-1961, LOS: 80 ADMISSION DATE:  02/24/2022, CONSULTATION DATE:  02/25/2022 REFERRING MD:  Dr. Lucky Cowboy, CHIEF COMPLAINT:  Acute Respiratory Distress & hypoxia, development of Hemoptysis post thrombectomy  Brief Pt Description / Synopsis:  61 y.o. female admitted with Acute Hypoxic Respiratory Failure in setting of Bilateral Pulmonary Embolism.  Underwent thrombectomy with Vascular Surgery, post procedure developed Hemoptysis leading to severe blockage of airways requiring emergent intubation. S/p bronch  therapeutic aspiration of blood clots from tracheobronchial tree.  History of Present Illness:  [name] is a 61 year old female with history of hypertension, depression, anxiety, restless leg syndrome, GERD, who presents emergency department for chief concerns of shortness of breath via POV.  Pt is currently intubated and sedation and unable to contribute to history, therefore history is obtained from chart review.   Per ED and nursing notes, she reported that she developed shortness of breath 3 to 4 days ago.  She denies known fever at home and trauma to her person.  She denies chest pain, abdominal pain, dysuria, hematuria.  She did have 1 episode of diarrhea today.  She denies blood in her stool.  She endorses having COVID about 4 weeks ago.   She denies recent international travel or long car trips.  She denies history of cancer that she knows of.  She denies recent surgery.   She denies changes to her leg pain.  She states at baseline she has leg pain because of MS and restless leg syndrome.  ED Course: Initial Vital Signs:  temperature of 99.1, respiration rate of 20, heart rate of 140, blood pressure 124/59, SPO2 of 94% on room air.  Significant Labs: Serum sodium is 138, potassium 2.7, chloride 106, bicarb 23, BUN of 14, serum creatinine 0.62, EGFR greater than 60, nonfasting blood glucose 106, WBC 15.7, hemoglobin  9.3, platelets of 231.  Imaging CTA PE: Was read as acute PE with moderate thrombus burden involving both lungs.  RV-LV ratio is 1.3 suggesting acute or chronic right heart dysfunction.  Small right pleural effusion.  Patchy infiltrates in the perihilar regions of both lung fields, right more than left.  Moderate to large size fixed hiatal hernia.  Bilateral lower extremity venous doppler ultrasounds>>  occlusive thrombus in both peroneal veins.  Medications Administered: Ceftriaxone 2 g IV, vancomycin, LR 150 mL bolus, doxycycline.   Hospitalist were asked to admit for further workup and treatment.  Vascular Surgery was consulted with plan for Thrombectomy.  Please see "significant hospital events" section below for full detailed hospital course. Pertinent  Medical History   Past Medical History:  Diagnosis Date   GERD (gastroesophageal reflux disease)    Multiple sclerosis ([place])      Micro Data:  11/14: Blood culture x2>>NGTD 11/15: MRSA PCR>>negative 11/21: Blood x2>>negative  11/22: COVID-19>>positive  11/26: Respiratory panel >>negative  11/26: Resp Culture>>no staph aureus or pseudomonas seen  11/26: MRSA PCR>>negative  11/26: Cdiff>>negative  11/27: Blood x4>>negative   Antimicrobials:   Antibiotics Given (last 72 hours)     Date/Time Action Medication Dose Rate   03/13/22 1100 New Bag/Given   linezolid (ZYVOX) IVPB 600 mg 600 mg 300 mL/hr   03/13/22 1331 New Bag/Given   meropenem (MERREM) 1 g in sodium chloride 0.9 % 100 mL IVPB 1 g 200 mL/hr      Significant Hospital Events: Including procedures, antibiotic start and stop dates in addition to other pertinent events   11/14: Admitted by Hospitalist.  Vascular Surgery consulted 11/15:  Thrombectomy performed, massive amounts of clot removed.  Post procedure developed Acute Hemoptysis requiring emergent intubation and transfer to ICU.  PCCM consulted, plan for emergent Bronchoscopy 11/15 3 hour BRONCH to extract  clots 11/16  2 units PRBC's to be given.  Repeat Bronch. 11/17: Off pressors. Increasing FiO2 requirements (60% FiO2, 5 peep), CXR with increase in bilateral opacities concerning for PNA.  NO SBT today. Place OG and start feeds. 11/18: Heparin gtt restarted yesterday with no bolus, no hemoptysis reported and Hgb remains stable.  Slow improvement in FiO2 to 60% (from 75%) with diuresis yesterday, renal function remains normal so will diurese again today 11/19: Still with high vent requirements (60% FiO2, 10 PEEP), Heparin gtt d/c due to bmall volume bright red blood from ETT.  Collagen Vascular workup sent.  Holding diuresis due to increased Na+ & BUN.  Solumedrol increased to 40 mg BID 11/20: Slight improvement in vent requirements (50% FiO2, 8 PEEP).  Worsening Leukocytosis, tracheal aspirate results pending, start Zosyn. 11/21: repeat CT overnight, vomited while in CT. 11/22: no bleeding noted. 11/23: tolerated SBT and sedation holiday - mental status continues to be sluggish 11/24: tolerated SBT and sedation holiday 11/25: tolerated SBT and sedation holiday 11/28: ABNORMAL MRI, REMIANS PVS 11/29: Pt able to nod yes/no and blink on commands and respond to questions appropriately by nodding yes/no; still unable to follow commands on BLU/BLL extremities; will consult PT/OT  11/30: Pt unresponsive and not following commands this am.  No acute events overnight  12/3: Pt successfully extubated 12/4: Pt with weak cough and inability to clear secretions requiring prn suctioning, however no signs of respiratory distress  Interim History / Subjective:  As outlined above   Objective   Blood pressure 119/77, pulse (!) 130, temperature 98.4 F (36.9 C), temperature source Axillary, resp. rate (!) 29, height _0  (1.575 m), weight 65.9 kg, SpO2 94 %.    FiO2 (%):  [35 %] 35 %   Intake/Output Summary (Last 24 hours) at 03/16/2022 1037 Last data filed at 03/16/2022 0600 Gross per 24 hour  Intake  745.37 ml  Output 1800 ml  Net -1054.63 ml   Filed Weights   03/14/22 0500 03/15/22 0500 03/16/22 0446  Weight: 66.6 kg 66.9 kg 65.9 kg   Examination: General: Acute on chronic ill-appearing female, NAD on 3L O2 HENT: Atraumatic, normocephalic, neck supple, no JVD present  Lungs: Rhonchi throughout, even, non labored  Cardiovascular: Sinus tachycardia, s1s2, no r/g, 2+ radial/2+ distal pulses, no edema  Abdomen: +BS x4, obese, soft, non tender, non distended  Neuro: Alert and following commands, PERRLA  Extrem: Not spont moving BLU/BLL extremities GU: Indwelling foley catheter in place draining yellow urine   REVIEW OF SYSTEMS: Positives in BOLD  Gen: Denies fever, chills, weight change, fatigue, night sweats HEENT: Denies blurred vision, double vision, hearing loss, tinnitus, sinus congestion, rhinorrhea, sore throat, neck stiffness, dysphagia PULM: Denies shortness of breath, cough, sputum production, hemoptysis, wheezing CV: Denies chest pain, edema, orthopnea, paroxysmal nocturnal dyspnea, palpitations GI: Denies abdominal pain, nausea, vomiting, diarrhea, hematochezia, melena, constipation, change in bowel habits GU: Denies dysuria, hematuria, polyuria, oliguria, urethral discharge Endocrine: Denies hot or cold intolerance, polyuria, polyphagia or appetite change Derm: Denies rash, dry skin, scaling or peeling skin change Heme: Denies easy bruising, bleeding, bleeding gums Neuro: Denies headache, numbness, weakness, slurred speech, loss of memory or consciousness   Resolved Hospital Problem list     Assessment & Plan:   Acute  hypoxic respiratory failure in setting of bilateral pulmonary embolism, hemoptysis, and presumed Aspiration  S/p Bronchoscopy x2 - Supplemental O2 for dyspnea and/or hypoxia  - Aggressive pulmonary hygiene  - Scheduled bronchodilator therapy   Bilateral Pulmonary Embolism & Bilateral peroneal lower extremity thrombus, suspect in setting of recent  COVID-19 infection about 3 weeks prior Hypotension: Related to sedation +/- acute blood loss with hemoptysis +/- sepsis from aspiration/pneumonia~resolved  Sinus tachycardia  Echocardiogram 02/24/22: LVEF 31-49%, normal diastolic parameters, RV size and systolic function normal S/p Thrombectomy 11/15 - Continuous cardiac monitoring - Continue scheduled metoprolol and apixaban  - Vascular Surgery following, appreciate input  Presumed VAP vs Aspiration pneumonitis  - Trend WBC and monitor fever curve  - Completed abx course   Acute metabolic encephalopathy~resolving   PMHx: MS  MRI Brain 03/09/22: No acute infarct but does reveal numerous chronic microhemorrhages which are new from the scan from few months ago - Pt severely debilitated PT/OT consulted appreciate input  - Prn dilaudid for pain management  - Neurology consulted appreciate input   Hyperglycemia - CBG's q4h; Target range of 140 to 180 - SSI - Follow ICU Hypo/Hyperglycemia protocol  Best Practice (right click and "Reselect all SmartList Selections" daily)   Diet/type: NPO, Continue TF's DVT prophylaxis: SCD; apixaban  GI prophylaxis: PPI Lines: N/A Foley:  Indwelling foley catheter and still needed  Code Status:  full code  12/04: Pt's significant other updated at bedside. Labs   CBC: Recent Labs  Lab 03/09/22 1943 03/10/22 0621 03/12/22 0331 03/13/22 0454 03/14/22 0504 03/15/22 0335 03/16/22 0324  WBC 38.1*   < > 25.7* 25.7* 26.2* 23.0* 22.0*  NEUTROABS 26.3*  --  15.3* 15.7*  --   --   --   HGB 7.7*   < > 8.9* 8.8* 8.2* 7.9* 8.6*  HCT 26.0*   < > 29.1* 29.1* 27.5* 26.8* 28.7*  MCV 95.6   < > 93.9 94.5 93.2 95.4 93.5  PLT 308   < > 303 273 268 298 359   < > = values in this interval not displayed.    Basic Metabolic Panel: Recent Labs  Lab 03/10/22 0621 03/11/22 0421 03/12/22 0331 03/13/22 0454 03/14/22 0504  NA 142 137 139 138 137  K 3.6 3.8 3.8 3.6 4.4  CL 109 107 102 101 99  CO2 _0 GLUCOSE 144* 130* 125* 123* 145*  BUN 25* 23* 20 21* 27*  CREATININE 0.35* 0.40* 0.36* <0.30* 0.39*  CALCIUM 8.6* 8.9 9.0 8.7* 8.8*  MG 2.2 2.3 2.2 2.2  --   PHOS 2.7 3.2 3.0 2.2* 3.2   GFR: Estimated Creatinine Clearance: 68.2 mL/min (A) (by C-G formula based on SCr of 0.39 mg/dL (L)). Recent Labs  Lab 03/13/22 0454 03/14/22 0504 03/15/22 0335 03/16/22 0324  WBC 25.7* 26.2* 23.0* 22.0*    Liver Function Tests: No results for input(s): "AST", "ALT", "ALKPHOS", "BILITOT", "PROT", "ALBUMIN" in the last 168 hours.  No results for input(s): "LIPASE", "AMYLASE" in the last 168 hours. No results for input(s): "AMMONIA" in the last 168 hours.  ABG    Component Value Date/Time   PHART 7.56 (H) 03/15/2022 1320   PCO2ART 41 03/15/2022 1320   PO2ART 95 03/15/2022 1320   HCO3 36.7 (H) 03/15/2022 1320   ACIDBASEDEF 4.0 (H) 02/26/2022 0307   O2SAT 99.8 03/15/2022 1320    REVIEW OF SYSTEMS: Positives in BOLD  Gen: Denies fever, chills, weight change, fatigue, night sweats HEENT: Denies blurred  vision, double vision, hearing loss, tinnitus, sinus congestion, rhinorrhea, sore throat, neck stiffness, dysphagia PULM: shortness of breath, cough, sputum production, hemoptysis, wheezing CV: Denies chest pain, edema, orthopnea, paroxysmal nocturnal dyspnea, palpitations GI: Denies abdominal pain, nausea, vomiting, diarrhea, hematochezia, melena, constipation, change in bowel habits GU: Denies dysuria, hematuria, polyuria, oliguria, urethral discharge Endocrine: Denies hot or cold intolerance, polyuria, polyphagia or appetite change Derm: Denies rash, dry skin, scaling or peeling skin change Heme: Denies easy bruising, bleeding, bleeding gums Neuro: headache, numbness, weakness, slurred speech, loss of memory or consciousness   Coagulation Profile: Home Medications  Prior to Admission medications   Medication Sig Start Date End Date Taking? Authorizing Provider  cyanocobalamin 1000  MCG tablet Take by mouth.   Yes [provider]  DULoxetine (CYMBALTA) 30 MG capsule Take 30 mg by mouth daily. 06/25/21  Yes [provider]  gabapentin (NEURONTIN) 400 MG capsule Take Gabapentin 400 mg in the morning, 400 mg in the afternoon, and 800 mg at night 12/29/21  Yes [provider]  omeprazole (PRILOSEC) 20 MG capsule Take 20 mg by mouth daily. 06/10/21  Yes [provider]  propranolol (INDERAL) 20 MG tablet Take 1 tablet by mouth 2 (two) times daily. 12/16/21  Yes [provider]  rOPINIRole (REQUIP) 0.25 MG tablet Take 0.25 mg at night for 1 week, then increase to 0.5 mg at night 05/29/21  Yes [provider]  tolterodine (DETROL LA) 2 MG 24 hr capsule Take 2 mg by mouth 2 (two) times daily. 12/29/21 12/29/22 Yes [provider]  ascorbic acid (VITAMIN C) 500 MG tablet Take by mouth.    [provider]  Cholecalciferol 50 MCG (2000 UT) CAPS Take by mouth.    [provider]  cyclobenzaprine (FLEXERIL) 10 MG tablet Take 10 mg by mouth 2 (two) times daily as needed. 11/26/21   [provider]  dalfampridine 10 MG TB12 Take 1 tablet by mouth every 12 (twelve) hours. 12/29/21   [provider]  meloxicam (MOBIC) 15 MG tablet Take 1 tablet (15 mg total) by mouth daily. 08/23/21 08/23/22  Cuthriell, Charline Bills, PA-C  methylPREDNISolone (MEDROL DOSEPAK) 4 MG TBPK tablet Take by mouth. 11/26/21   [provider]  naproxen (NAPROSYN) 500 MG tablet Take by mouth.    [provider]  nitrofurantoin, macrocrystal-monohydrate, (MACROBID) 100 MG capsule Take 1 capsule (100 mg total) by mouth 2 (two) times daily. 01/19/22   Immordino, Annie Main, FNP  pregabalin (LYRICA) 75 MG capsule Take 75 mg by mouth 3 (three) times daily. 08/18/21   [provider]  traMADol (ULTRAM) 50 MG tablet Take 50 mg by mouth 3 (three) times daily as needed. 12/26/21   [provider]    Will transfer to  [facility] on 03/17/22  Critical Care Time: 32 minutes   Donell Beers, [place] Pager [phone] (please enter 7 digits) PCCM Consult Pager [phone] (please enter 7 digits)

## 2022-03-16 NOTE — Evaluation (Signed)
Clinical/Bedside Swallow Evaluation Patient Details  Name: [name] MRN: [id number] Date of Birth: 1960-09-11  Today's Date: 03/16/2022 Time: SLP Start Time (ACUTE ONLY): 0830 SLP Stop Time (ACUTE ONLY): 0845 SLP Time Calculation (min) (ACUTE ONLY): 15 min  Past Medical History:  Past Medical History:  Diagnosis Date   GERD (gastroesophageal reflux disease)    Multiple sclerosis ([place])    Past Surgical History:  Past Surgical History:  Procedure Laterality Date   ESOPHAGOGASTRODUODENOSCOPY (EGD) WITH PROPOFOL N/A 09/11/2021   Procedure: ESOPHAGOGASTRODUODENOSCOPY (EGD) WITH PROPOFOL;  Surgeon: Annamaria Helling, DO;  Location: [facility] ENDOSCOPY;  Service: Gastroenterology;  Laterality: N/A;   PULMONARY THROMBECTOMY Bilateral 02/25/2022   Procedure: PULMONARY THROMBECTOMY;  Surgeon: Algernon Huxley, MD;  Location: [place] CV LAB;  Service: Cardiovascular;  Laterality: Bilateral;   TUBAL LIGATION     HPI:  Per H&P "Ms. Kelisha Dall is a 61 year old female with history of hypertension, depression, anxiety, restless leg syndrome, GERD, who presents emergency department for chief concerns of shortness of breath via POV.     Initial vitals in the emergency department showed temperature of 99.1, respiration rate of 20, heart rate of 140, blood pressure 124/59, SPO2 of 94% on room air.     Serum sodium is 138, potassium 2.7, chloride 106, bicarb 23, BUN of 14, serum creatinine 0.62, EGFR greater than 60, nonfasting blood glucose 106, WBC 15.7, hemoglobin 9.3, platelets of 231." Pt intubated 02/25/22-03/15/22.    Assessment / Plan / Recommendation  Clinical Impression  Pt seen for clinical swallowing evaluation. Pt alert. Aphonic. Inconsistent ability to follow commands. Pt and husband endorse pt coughing up secretions; suction at beside. On 2L/min O2 via [place]. Cleared with RN. Husband at bedside.   Oral motor examination completed and remarkable for aphonia, weak/congested  cough, absent volitional swallow, mild xerostomia, and reduced lingual movement with protrusion and lateralization (vs effort; ability to follow commands).   Pt given trials of x2 ice chips. Concern for strongly suspected pharyngeal dysphagia given absent swallow on 1 of 2 trials, seemingly delayed swallow initiation with seemingly reduced laryngeal elevation on 1 of 2 trials, and wet/weak coughing without both trials.   Recommend strict NPO with continuation of short term alternate route of nutrition/hydration/medication.   Pt is at increased risk of aspiration/aspiration PNA give dysphonia, weak/wet cough which is concerning for reduced secretion management, overall deconditioned state, prolonged/recent intubation, and medical comorbidities.   SLP to f/u per POC for clinical swallowing re-evaluation and consideration for instrumental swallowing evaluation given clincial risk factors.   Pt and pt's husband made aware of results, recommendations, SLP POC, rationale for NPO recommendation, clincial risk factors for pharyngeal dysphagia, sequelae of aspiration/aspiration PNA, and relationship between breathing/swallowing. Pt nodding in agreement, ?full understanding. Reinforcement of content may be needed. Husband verbalized understanding/agreement. RN also made aware of results, recommendations, and SLP POC.   SLP Visit Diagnosis: Dysphagia, pharyngeal phase (R13.13)    Aspiration Risk  Severe aspiration risk    Diet Recommendation NPO;Alternative means - temporary   Medication Administration: Via alternative means    Other  Recommendations Oral Care Recommendations: Oral care QID;Staff/trained caregiver to provide oral care    Recommendations for follow up therapy are one component of a multi-disciplinary discharge planning process, led by the attending physician.  Recommendations may be updated based on patient status, additional functional criteria and insurance authorization.  Follow up  Recommendations  (TBD)      Assistance Recommended at Discharge    Functional Status  Assessment Patient has had a recent decline in their functional status and demonstrates the ability to make significant improvements in function in a reasonable and predictable amount of time.  Frequency and Duration            Prognosis Prognosis for Safe Diet Advancement: Fair Barriers to Reach Goals: Severity of deficits;Time post onset      Swallow Study   General Date of Onset: 02/25/22 HPI: Per H&P "Ms. Tinika Bucknam is a 61 year old female with history of hypertension, depression, anxiety, restless leg syndrome, GERD, who presents emergency department for chief concerns of shortness of breath via POV.     Initial vitals in the emergency department showed temperature of 99.1, respiration rate of 20, heart rate of 140, blood pressure 124/59, SPO2 of 94% on room air.     Serum sodium is 138, potassium 2.7, chloride 106, bicarb 23, BUN of 14, serum creatinine 0.62, EGFR greater than 60, nonfasting blood glucose 106, WBC 15.7, hemoglobin 9.3, platelets of 231." Pt intubated 02/25/22-03/15/22. Type of Study: Bedside Swallow Evaluation Diet Prior to this Study: NPO;NG Tube Respiratory Status: Nasal cannula (2L/min) History of Recent Intubation: Yes Length of Intubations (days): 19 days Date extubated: 03/15/22 Behavior/Cognition: Alert;Requires cueing Oral Cavity Assessment: Dry Oral Care Completed by SLP: Recent completion by staff Oral Cavity - Dentition: Adequate natural dentition Vision: Functional for self-feeding Self-Feeding Abilities:  (did not attempt to feed self) Baseline Vocal Quality: Aphonic Volitional Cough: Weak;Congested Volitional Swallow: Unable to elicit    Oral/Motor/Sensory Function Overall Oral Motor/Sensory Function: Mild impairment Lingual ROM: Reduced right;Reduced left (vs effort)   Ice Chips Ice chips: Impaired Presentation: Spoon Pharyngeal Phase Impairments:  Suspected delayed Swallow;Decreased hyoid-laryngeal movement;Wet Vocal Quality;Cough - Immediate;Unable to trigger swallow   Thin Liquid Thin Liquid: Not tested    Nectar Thick Nectar Thick Liquid: Not tested   Honey Thick Honey Thick Liquid: Not tested   Puree Puree: Not tested   Solid     Solid: Not tested     Cherrie Gauze, M.S., [facility] [phone] (ASCOM)   Clearnce Sorrel Nickalaus [place] 03/16/2022,9:36 AM

## 2022-03-17 ENCOUNTER — Encounter
Admission: EM | Disposition: A | Discharge status: Nursing Home (Includes ICF) | Payer: Self-pay | Source: Home / Self Care | Attending: Student in an Organized Health Care Education/Training Program

## 2022-03-17 DIAGNOSIS — I2699 Other pulmonary embolism without acute cor pulmonale: Secondary | ICD-10-CM | POA: Diagnosis not present

## 2022-03-17 DIAGNOSIS — R1319 Other dysphagia: Secondary | ICD-10-CM | POA: Diagnosis not present

## 2022-03-17 DIAGNOSIS — J9601 Acute respiratory failure with hypoxia: Secondary | ICD-10-CM | POA: Diagnosis not present

## 2022-03-17 LAB — GLUCOSE, CAPILLARY
Glucose-Capillary: 112 mg/dL — ABNORMAL HIGH (ref 70–99)
Glucose-Capillary: 126 mg/dL — ABNORMAL HIGH (ref 70–99)
Glucose-Capillary: 139 mg/dL — ABNORMAL HIGH (ref 70–99)
Glucose-Capillary: 141 mg/dL — ABNORMAL HIGH (ref 70–99)
Glucose-Capillary: 142 mg/dL — ABNORMAL HIGH (ref 70–99)
Glucose-Capillary: 147 mg/dL — ABNORMAL HIGH (ref 70–99)

## 2022-03-17 LAB — BASIC METABOLIC PANEL
Anion gap: 8 (ref 5–15)
BUN: 27 mg/dL — ABNORMAL HIGH (ref 6–20)
CO2: 30 mmol/L (ref 22–32)
Calcium: 10 mg/dL (ref 8.9–10.3)
Chloride: 105 mmol/L (ref 98–111)
Creatinine, Ser: 0.34 mg/dL — ABNORMAL LOW (ref 0.44–1.00)
GFR, Estimated: >60 mL/min (ref >60)
Glucose, Bld: 132 mg/dL — ABNORMAL HIGH (ref 70–99)
Potassium: 3.9 mmol/L (ref 3.5–5.1)
Sodium: 143 mmol/L (ref 135–145)

## 2022-03-17 LAB — COMP PANEL: LEUKEMIA/LYMPHOMA

## 2022-03-17 LAB — MAGNESIUM: Magnesium: 2.4 mg/dL (ref 1.7–2.4)

## 2022-03-17 LAB — CBC
HCT: 30.4 % — ABNORMAL LOW (ref 36.0–46.0)
Hemoglobin: 9.1 g/dL — ABNORMAL LOW (ref 12.0–15.0)
MCH: 28.4 pg (ref 26.0–34.0)
MCHC: 29.9 g/dL — ABNORMAL LOW (ref 30.0–36.0)
MCV: 95 fL (ref 80.0–100.0)
Platelets: 363 10*3/uL (ref 150–400)
RBC: 3.2 MIL/uL — ABNORMAL LOW (ref 3.87–5.11)
RDW: 19.7 % — ABNORMAL HIGH (ref 11.5–15.5)
WBC: 17.7 10*3/uL — ABNORMAL HIGH (ref 4.0–10.5)
nRBC: 0.1 % (ref 0.0–0.2)

## 2022-03-17 LAB — EPSTEIN BARR VRS(EBV DNA BY PCR): EBV DNA QN by PCR: NEGATIVE IU/mL

## 2022-03-17 LAB — PHOSPHORUS: Phosphorus: 4.2 mg/dL (ref 2.5–4.6)

## 2022-03-17 SURGERY — CREATION, TRACHEOSTOMY
Anesthesia: Choice

## 2022-03-17 MED ORDER — NUTRISOURCE FIBER PO PACK
1.0000 | PACK | Freq: Three times a day (TID) | ORAL | Status: DC
  Administered 2022-03-17 – 2022-03-20 (×10): 1
  Filled 2022-03-17 (×11): qty 1

## 2022-03-17 MED ORDER — FREE WATER
100.0000 mL | Status: DC
  Administered 2022-03-17 – 2022-03-20 (×19): 100 mL

## 2022-03-17 MED ORDER — METOPROLOL TARTRATE 5 MG/5ML IV SOLN
5.0000 mg | Freq: Three times a day (TID) | INTRAVENOUS | Status: DC | PRN
  Administered 2022-03-17 – 2022-03-20 (×5): 5 mg via INTRAVENOUS
  Filled 2022-03-17 (×5): qty 5

## 2022-03-17 NOTE — Progress Notes (Signed)
Physical Therapy Treatment Patient Details Name: [name] MRN: [id number] DOB: 07-Mar-1961 Today's Date: 03/17/2022   History of Present Illness 62 year old female with history of MS, hypertension, depression, anxiety, restless leg syndrome, GERD, who presented to emergency department for chief concerns of shortness of breath. Pt found to be positive for covid with b/l PE and DVT noted.  Now s/p thrombectomy and multiple bronchoscopy procedures. unable to wean from the ventilator. MRI imaging of the brain showed chronic cerebral and cerebellar micro-hemorrhages.    PT Comments    Pt awake and obviously pleased to see PT on arrival.  Significant other present during session PT issued HEP hand out and provided demo and explanation of LE exercises/PROM/stretches.  Pt showing effort despite being very limited with what she is able to engage re: LE activity.  She did appear to have some occasional hip Abd AAROM effort (R>L) but inconsistent.  There was tone responses during some exercises most notedly in R quad and L ankle clonus.  B/L calf stretches with ankle DF to neutral.  Pt motivated but overall very functionally limited.  Recommendations for follow up therapy are one component of a multi-disciplinary discharge planning process, led by the attending physician.  Recommendations may be updated based on patient status, additional functional criteria and insurance authorization.  Follow Up Recommendations   (per progress, likely LTC)     Assistance Recommended at Discharge Frequent or constant Supervision/Assistance  Patient can return home with the following Two people to help with walking and/or transfers;Two people to help with bathing/dressing/bathroom;Assistance with cooking/housework;Assistance with feeding   Equipment Recommendations       Recommendations for Other Services       Precautions / Restrictions Precautions Precautions: Fall Restrictions Weight Bearing  Restrictions: No RLE Weight Bearing: Non weight bearing LLE Weight Bearing: Non weight bearing     Mobility  Bed Mobility               General bed mobility comments: deferred due to severity of weakness    Transfers                   General transfer comment: unsafe to attempt    Ambulation/Gait                   Stairs             Wheelchair Mobility    Modified Rankin (Stroke Patients Only)       Balance                                            Cognition Arousal/Alertness: Awake/alert                                     General Comments: Pt appeared to follow conversation well, less sleepy than she has been        Exercises Other Exercises Other Exercises: PROM exercises for U&LEs:  pt unable to show any AROM strength but did have ability to assist with b/l elbow extension on a few reps with elbow PROM.  unable to grip. ankle PF/DF stretch/ROM, hip Ab/Ad (seemly occasional fleeting hip Abd "pushes", heel slides. all PROM with very rare possible AAROM despite much encouragement (and apparent pt effort). Clonus/reflexive tone responses with some exercises (  R quad, L ankle most noted).  At least 10 of each exercises with consistent cuing and pulsed ROM and muscle mass tapping with attempt to illicit some response. Other Exercises: Signficant other present, issued hand out for LE supine exericses/stretches/PROM.  Explanation along with education on positioning and strategies for HEP.    General Comments General comments (skin integrity, edema, etc.): Pt more awake and alert than prior session, BM during supine exercises.  HR staying in the 120-130 range w/o session, O2 high 90s on 3L      Pertinent Vitals/Pain Pain Assessment Faces Pain Scale: No hurt Pain Location: signficant other reports that she was having consistent R hip pain before hospitalization, she indicates no pain when asked during R hip  PROM    Home Living                          Prior Function            PT Goals (current goals can now be found in the care plan section) Progress towards PT goals:  (better able to interact and attempt participation this date)    Frequency    Min 2X/week      PT Plan Current plan remains appropriate    Co-evaluation              AM-PAC PT "6 Clicks" Mobility   Outcome Measure  Help needed turning from your back to your side while in a flat bed without using bedrails?: Total Help needed moving from lying on your back to sitting on the side of a flat bed without using bedrails?: Total Help needed moving to and from a bed to a chair (including a wheelchair)?: Total Help needed standing up from a chair using your arms (e.g., wheelchair or bedside chair)?: Total Help needed to walk in hospital room?: Total Help needed climbing 3-5 steps with a railing? : Total 6 Click Score: 6    End of Session Equipment Utilized During Treatment: Oxygen (3L)       PT Visit Diagnosis: Muscle weakness (generalized) (M62.81);Other symptoms and signs involving the nervous system (R29.898)     Time: 2229-7989 PT Time Calculation (min) (ACUTE ONLY): 28 min  Charges:  $Therapeutic Exercise: 23-37 mins                     Malachi Pro, DPT 03/17/2022, 5:22 PM

## 2022-03-17 NOTE — Progress Notes (Signed)
PROGRESS NOTE   HPI was taken from Dr. Tobie Poet: [name] is a 61 year old female with history of hypertension, depression, anxiety, restless leg syndrome, GERD, who presents emergency department for chief concerns of shortness of breath via POV.   Initial vitals in the emergency department showed temperature of 99.1, respiration rate of 20, heart rate of 140, blood pressure 124/59, SPO2 of 94% on room air.   Serum sodium is 138, potassium 2.7, chloride 106, bicarb 23, BUN of 14, serum creatinine 0.62, EGFR greater than 60, nonfasting blood glucose 106, WBC 15.7, hemoglobin 9.3, platelets of 231.   CTA PE: Was read as acute PE with moderate thrombus burden involving both lungs.  RV-LV ratio is 1.3 suggesting acute or chronic right heart dysfunction.  Small right pleural effusion.  Patchy infiltrates in the perihilar regions of both lung fields, right more than left.  Moderate to large size fixed hiatal hernia.   ED treatment: Ceftriaxone 2 g IV, vancomycin, LR 150 mL bolus, doxycycline. --------------------------- At bedside patient was able to tell me her name, her age, the current calendar year and she knows that she is in the hospital.   She reports that she developed shortness of breath 3 to 4 days ago.  She denies known fever at home and trauma to her person.  She denies chest pain, abdominal pain, dysuria, hematuria.  She did have 1 episode of diarrhea today.  She denies blood in her stool.  She endorses having COVID about 4 weeks ago.   She denies recent international travel or long car trips.  She denies history of cancer that she knows of.  She denies recent surgery.   She denies changes to her leg pain.  She states at baseline she has leg pain because of MS and restless leg syndrome.   Patient reports that her last meal was approximately 8 or 9 AM and numerous piece of toast.    As per NP Nelson: 11/14: Admitted by Hospitalist.  Vascular Surgery consulted 11/15:   Thrombectomy performed, massive amounts of clot removed.  Post procedure developed Acute Hemoptysis requiring emergent intubation and transfer to ICU.  PCCM consulted, plan for emergent Bronchoscopy 11/15 3 hour BRONCH to extract clots 11/16  2 units PRBC's to be given.  Repeat Bronch. 11/17: Off pressors. Increasing FiO2 requirements (60% FiO2, 5 peep), CXR with increase in bilateral opacities concerning for PNA.  NO SBT today. Place OG and start feeds. 11/18: Heparin gtt restarted yesterday with no bolus, no hemoptysis reported and Hgb remains stable.  Slow improvement in FiO2 to 60% (from 75%) with diuresis yesterday, renal function remains normal so will diurese again today 11/19: Still with high vent requirements (60% FiO2, 10 PEEP), Heparin gtt d/c due to bmall volume bright red blood from ETT.  Collagen Vascular workup sent.  Holding diuresis due to increased Na+ & BUN.  Solumedrol increased to 40 mg BID 11/20: Slight improvement in vent requirements (50% FiO2, 8 PEEP).  Worsening Leukocytosis, tracheal aspirate results pending, start Zosyn. 11/21: repeat CT overnight, vomited while in CT. 11/22: no bleeding noted. 11/23: tolerated SBT and sedation holiday - mental status continues to be sluggish 11/24: tolerated SBT and sedation holiday 11/25: tolerated SBT and sedation holiday 11/28: ABNORMAL MRI, REMIANS PVS 11/29: Pt able to nod yes/no and blink on commands and respond to questions appropriately by nodding yes/no; still unable to follow commands on BLU/BLL extremities; will consult PT/OT  11/30: Pt unresponsive and not following commands this am.  No acute events overnight  12/3: Pt successfully extubated 12/4: Pt with weak cough and inability to clear secretions requiring prn suctioning, however no signs of respiratory distress    [name]  TML:[id number] DOB: [birth year] DOA: 02/24/2022 PCP: [facility]   Assessment & Plan:   Principal Problem:   Bilateral  pulmonary embolism ([place]) Active Problems:   Multiple sclerosis (HCC)   Chronic constipation   Gastroesophageal reflux disease   RLS (restless legs syndrome)   Tachycardia   Sepsis ([place])   Acute hypoxic respiratory failure (HCC)   HAP (hospital-acquired pneumonia)   Pulmonary hemorrhage   Leucocytosis   FUO (fever of unknown origin)  Assessment and Plan: Acute hypoxic respiratory failure: secondary to b/l PE, hemoptysis & presumed aspiration. S/p bronchoscopy x 2. S/p intubation, ventilation & extubation. Continue on supplemental oxygen and wean as tolerated. Continue on bronchodilators & encourage incentive spirometry   Aspiration pneumonia: completed abx course. Continue on bronchodilators    B/L PE: w/ b/l peroneal LE DVT: w/ recent COVID19 infection approx 3 weeks prior. Echo in 11/23 showed EF 68-12%, normal diastolic function. S/p thrombectomy 11/15. Continue on metoprolol, eliquis  Acute metabolic encephalopathy: MRI brain: no acute infarct but does reveal numerous chronic microhemorrhages which are new from scan a from a few months ago. Continue w/ PT/OT. Neuro recs apprec  Generalized weakness: PT/OT recs LTAC   Hyperglycemia: continue on accuchecks q4H  Dysphagia: NPO as per speech. Continue w/ tube feeds. Speech will revaluate   Normocytic anemia: H&H are labile. Will transfuse if Hb < 7.0   DVT prophylaxis: eliquis  Code Status: full  Family Communication:  Disposition Plan: unclear, possibly d/c to LTAC   Level of care: Stepdown  Status is: Inpatient Remains inpatient appropriate because: severity of illness    Consultants:  ICU Onco  ENT  Procedures:  Antimicrobials:    Subjective: Pt shakes her head yes and no to questions. Pt denies any pain. Pt did not have any complaints.   Objective: Vitals:   03/17/22 0500 03/17/22 0600 03/17/22 0720 03/17/22 0800  BP: 130/71 128/75  (!) 142/74  Pulse: (!) 131 (!) 138  (!) 143  Resp: (!) 21 (!) 27  (!)  28  Temp:    99.1 F (37.3 C)  TempSrc:    Axillary  SpO2: 98% 97% 97% 97%  Weight: 64.2 kg     Height:        Intake/Output Summary (Last 24 hours) at 03/17/2022 0903 Last data filed at 03/17/2022 7517 Gross per 24 hour  Intake 3883.91 ml  Output 2050 ml  Net 1833.91 ml   Filed Weights   03/15/22 0500 03/16/22 0446 03/17/22 0500  Weight: 66.9 kg 65.9 kg 64.2 kg    Examination:  General exam: Appears calm and comfortable  Respiratory system: diminished breath sounds b/l Cardiovascular system: S1 & S2 +. No rubs, gallops or clicks.  Gastrointestinal system: Abdomen is nondistended, soft and nontender. Normal bowel sounds heard. Central nervous system: Alert and awake.  Psychiatry: Judgement and insight appears not at baseline. Flat mood and affect     Data Reviewed: I have personally reviewed following labs and imaging studies  CBC: Recent Labs  Lab 03/12/22 0331 03/13/22 0454 03/14/22 0504 03/15/22 0335 03/16/22 0324 03/17/22 0645  WBC 25.7* 25.7* 26.2* 23.0* 22.0* 17.7*  NEUTROABS 15.3* 15.7*  --   --   --   --   HGB 8.9* 8.8* 8.2* 7.9* 8.6* 9.1*  HCT 29.1* 29.1* 27.5*  26.8* 28.7* 30.4*  MCV 93.9 94.5 93.2 95.4 93.5 95.0  PLT 303 273 268 298 359 637   Basic Metabolic Panel: Recent Labs  Lab 03/11/22 0421 03/12/22 0331 03/13/22 0454 03/14/22 0504 03/17/22 0645  NA 137 139 138 137 143  K 3.8 3.8 3.6 4.4 3.9  CL 107 102 101 99 105  CO2 _0 GLUCOSE 130* 125* 123* 145* 132*  BUN 23* 20 21* 27* 27*  CREATININE 0.40* 0.36* <0.30* 0.39* 0.34*  CALCIUM 8.9 9.0 8.7* 8.8* 10.0  MG 2.3 2.2 2.2  --  2.4  PHOS 3.2 3.0 2.2* 3.2 4.2   GFR: Estimated Creatinine Clearance: 67.4 mL/min (A) (by C-G formula based on SCr of 0.34 mg/dL (L)). Liver Function Tests: No results for input(s): "AST", "ALT", "ALKPHOS", "BILITOT", "PROT", "ALBUMIN" in the last 168 hours. No results for input(s): "LIPASE", "AMYLASE" in the last 168 hours. No results for input(s):  "AMMONIA" in the last 168 hours. Coagulation Profile: Recent Labs  Lab 03/13/22 1653  INR 1.2   Cardiac Enzymes: Recent Labs  Lab 03/14/22 0504  CKTOTAL 16*   BNP (last 3 results) No results for input(s): "PROBNP" in the last 8760 hours. HbA1C: No results for input(s): "HGBA1C" in the last 72 hours. CBG: Recent Labs  Lab 03/16/22 1606 03/16/22 1939 03/16/22 2307 03/17/22 0352 03/17/22 0720  GLUCAP 122* 147* 132* 141* 142*   Lipid Profile: No results for input(s): "CHOL", "HDL", "LDLCALC", "TRIG", "CHOLHDL", "LDLDIRECT" in the last 72 hours. Thyroid Function Tests: No results for input(s): "TSH", "T4TOTAL", "FREET4", "T3FREE", "THYROIDAB" in the last 72 hours. Anemia Panel: No results for input(s): "VITAMINB12", "FOLATE", "FERRITIN", "TIBC", "IRON", "RETICCTPCT" in the last 72 hours. Sepsis Labs: No results for input(s): "PROCALCITON", "LATICACIDVEN" in the last 168 hours.  Recent Results (from the past 240 hour(s))  C Difficile Quick Screen (NO PCR Reflex)     Status: None   Collection Time: 03/08/22  9:15 AM   Specimen: STOOL  Result Value Ref Range Status   C Diff antigen NEGATIVE NEGATIVE Final   C Diff toxin NEGATIVE NEGATIVE Final   C Diff interpretation No C. difficile detected.  Final    Comment: Performed at [facility], [address] [place], [place] [address]  Respiratory (~20 pathogens) panel by PCR     Status: None   Collection Time: 03/08/22  8:41 PM   Specimen: Tracheal Aspirate; Respiratory  Result Value Ref Range Status   Adenovirus NOT DETECTED NOT DETECTED Final   Coronavirus 229E NOT DETECTED NOT DETECTED Final    Comment: (NOTE) The Coronavirus on the Respiratory Panel, DOES NOT test for the novel  Coronavirus (2019 nCoV)    Coronavirus HKU1 NOT DETECTED NOT DETECTED Final   Coronavirus NL63 NOT DETECTED NOT DETECTED Final   Coronavirus OC43 NOT DETECTED NOT DETECTED Final   Metapneumovirus NOT DETECTED NOT DETECTED Final    Rhinovirus / Enterovirus NOT DETECTED NOT DETECTED Final   Influenza A NOT DETECTED NOT DETECTED Final   Influenza B NOT DETECTED NOT DETECTED Final   Parainfluenza Virus 1 NOT DETECTED NOT DETECTED Final   Parainfluenza Virus 2 NOT DETECTED NOT DETECTED Final   Parainfluenza Virus 3 NOT DETECTED NOT DETECTED Final   Parainfluenza Virus 4 NOT DETECTED NOT DETECTED Final   Respiratory Syncytial Virus NOT DETECTED NOT DETECTED Final   Bordetella pertussis NOT DETECTED NOT DETECTED Final   Bordetella Parapertussis NOT DETECTED NOT DETECTED Final   Chlamydophila pneumoniae NOT  DETECTED NOT DETECTED Final   Mycoplasma pneumoniae NOT DETECTED NOT DETECTED Final    Comment: Performed at [facility], [place] [address], [place], [place] [address]  Culture, Respiratory w Gram Stain     Status: None   Collection Time: 03/08/22  8:42 PM   Specimen: Tracheal Aspirate; Respiratory  Result Value Ref Range Status   Specimen Description   Final    EXPECTORATED SPUTUM Performed at [facility], [address], [place], [place] [address]    Special Requests   Final    NONE Performed at [facility], [address] [place], [place] [address]    Gram Stain   Final    FEW WBC PRESENT,BOTH PMN AND MONONUCLEAR FEW GRAM POSITIVE COCCI IN PAIRS IN CLUSTERS FEW GRAM NEGATIVE RODS FEW GRAM POSITIVE RODS    Culture   Final    FEW Normal respiratory flora-no Staph aureus or Pseudomonas seen Performed at [facility], [address], [place], [place] [address]    Report Status 03/11/2022 FINAL  Final  MRSA Next Gen by PCR, Nasal     Status: None   Collection Time: 03/08/22  9:52 PM   Specimen: Nasal Mucosa; Nasal Swab  Result Value Ref Range Status   MRSA by PCR Next Gen NOT DETECTED NOT DETECTED Final    Comment: (NOTE) The GeneXpert MRSA Assay (FDA approved for NASAL specimens only), is one component of a comprehensive MRSA colonization surveillance program. It is not  intended to diagnose MRSA infection nor to guide or monitor treatment for MRSA infections. Test performance is not FDA approved in patients less than 57 years old. Performed at [facility], [address] [place], [place] [address]   Culture, blood (Routine X 2) w Reflex to ID Panel     Status: None   Collection Time: 03/09/22 10:45 AM   Specimen: BLOOD  Result Value Ref Range Status   Specimen Description BLOOD BLOOD LEFT HAND  Final   Special Requests   Final    BOTTLES DRAWN AEROBIC ONLY Blood Culture results may not be optimal due to an inadequate volume of blood received in culture bottles   Culture   Final    NO GROWTH 5 DAYS Performed at [facility], [address] [place], [place] [address]    Report Status 03/14/2022 FINAL  Final  Culture, blood (Routine X 2) w Reflex to ID Panel     Status: None   Collection Time: 03/09/22 10:57 AM   Specimen: BLOOD  Result Value Ref Range Status   Specimen Description BLOOD BLOOD RIGHT HAND  Final   Special Requests   Final    BOTTLES DRAWN AEROBIC AND ANAEROBIC Blood Culture adequate volume   Culture   Final    NO GROWTH 5 DAYS Performed at [facility], [address] [place], [place] [address]    Report Status 03/14/2022 FINAL  Final  Culture, blood (Routine X 2) w Reflex to ID Panel     Status: None   Collection Time: 03/09/22  7:44 PM   Specimen: BLOOD  Result Value Ref Range Status   Specimen Description BLOOD BLOOD RIGHT HAND  Final   Special Requests   Final    BOTTLES DRAWN AEROBIC AND ANAEROBIC Blood Culture adequate volume   Culture   Final    NO GROWTH 5 DAYS Performed at [facility], [address], [place], [place] [address]    Report Status  03/14/2022 FINAL  Final  Culture, blood (Routine X 2) w Reflex to ID Panel     Status: None   Collection Time: 03/09/22  7:47 PM   Specimen: BLOOD  Result Value Ref Range Status   Specimen Description BLOOD BLOOD LEFT HAND  Final    Special Requests   Final    BOTTLES DRAWN AEROBIC AND ANAEROBIC Blood Culture adequate volume   Culture   Final    NO GROWTH 5 DAYS Performed at [facility], [address] [place], [place] [address]    Report Status 03/14/2022 FINAL  Final         Radiology Studies: DG Abd 1 View  Result Date: 03/15/2022 CLINICAL DATA:  NG tube placement. EXAM: ABDOMEN - 1 VIEW COMPARISON:  CT 03/03/2022 FINDINGS: Tip of the enteric tube below the diaphragm in the stomach, the side port is just beyond the gastroesophageal junction. Nonspecific upper abdominal bowel gas pattern, generalized paucity of bowel gas. IMPRESSION: Tip of the enteric tube below the diaphragm in the stomach, side-port just beyond the gastroesophageal junction. Electronically Signed   By: Keith Rake M.D.   On: 03/15/2022 11:32        Scheduled Meds:  apixaban  5 mg Per Tube BID   arformoterol  15 mcg Nebulization BID   Chlorhexidine Gluconate Cloth  6 each Topical Daily   cyanocobalamin  1,000 mcg Per Tube Daily   feeding supplement (PROSource TF20)  60 mL Per Tube Daily   fiber  1 packet Per Tube BID   free water  170 mL Per Tube Q4H   insulin aspart  0-15 Units Subcutaneous Q4H   metoprolol tartrate  25 mg Per Tube QID   nutrition supplement (JUVEN)  1 packet Per Tube BID BM   mouth rinse  15 mL Mouth Rinse 4 times per day   pantoprazole (PROTONIX) IV  40 mg Intravenous Q24H   revefenacin  175 mcg Nebulization Daily   Continuous Infusions:  sodium chloride Stopped (03/09/22 0212)   feeding supplement (OSMOLITE 1.5 CAL) 55 mL/hr at 03/17/22 0838     LOS: 21 days    Time spent: 35 mins     Wyvonnia Dusky, MD [facility] Pager [phone]  If 7PM-7AM, please contact night-coverage [email or web address] 03/17/2022, 9:03 AM

## 2022-03-17 NOTE — TOC Progression Note (Signed)
Transition of Care [facility]) - Progression Note    Patient Details  Name: [name] MRN: [id number] Date of Birth: [birth year]  Transition of Care [facility] CM/SW Contact  Allayne Butcher, RN Phone Number: 03/17/2022, 3:10 PM  Clinical Narrative:    Patient's sister will be touring [facility] this evening and she will look at the virtual tour on [employer].  Sister should have a decision by tomorrow.    Expected Discharge Plan: Long Term Acute Care (LTAC) Barriers to Discharge: Continued Medical Work up  Expected Discharge Plan and Services Expected Discharge Plan: Long Term Acute Care (LTAC)   Discharge Planning Services: CM Consult Post Acute Care Choice: Long Term Acute Care (LTAC) Living arrangements for the past 2 months: Single Family Home                 DME Arranged: N/A DME Agency: NA       HH Arranged: NA HH Agency: NA         Social Determinants of Health (SDOH) Interventions    Readmission Risk Interventions     No data to display

## 2022-03-17 NOTE — Progress Notes (Signed)
PHARMACY CONSULT NOTE  Pharmacy Consult for Electrolyte Monitoring and Replacement   Recent Labs: Potassium (mmol/L)  Date Value  03/14/2022 4.4   Magnesium (mg/dL)  Date Value  00/86/7619 2.2   Calcium (mg/dL)  Date Value  50/93/2671 8.8 (L)   Albumin (g/dL)  Date Value  24/58/0998 2.4 (L)   Phosphorus (mg/dL)  Date Value  33/82/5053 3.2   Sodium (mmol/L)  Date Value  03/14/2022 137   Assessment: 61 yo female presented to ED with SOB when lying flat and some right sided rib and shoulder pain.  Chest CT showed acute PE.  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 Pharmacy is asked to follow and replace electrolytes while in CCU.  Nutrition: Tube feeds at 7ml/h, FWF q4h  Goal of Therapy:  Electrolytes within normal limits  Plan:  --no BMP, no electrolyte replacement at this time --electrolytes have been stable, follow-up electrolytes with AM labs every other day  Lowella Bandy 03/17/2022 7:36 AM

## 2022-03-17 NOTE — Progress Notes (Addendum)
Occupational Therapy Treatment Patient Details Name: [name] MRN: [id number] DOB: May 31, 1960 Today's Date: 03/17/2022   History of present illness 61 year old female with history of MS, hypertension, depression, anxiety, restless leg syndrome, GERD, who presented to emergency department for chief concerns of shortness of breath. Pt found to be positive for covid with b/l PE and DVT noted.  Now s/p thrombectomy and multiple bronchoscopy procedures. unable to wean from the ventilator. MRI imaging of the brain showed chronic cerebral and cerebellar micro-hemorrhages.   OT comments  Upon entering the room, pt supine in bed with eyes open and significant other present. Pt does attempt to speak softly once during session. Hand over hand assistance utilized to wash face and B hands. Pt quickly falls asleep and wakes back up multiple times during session. Pt placed into chair position to increase alertness. Pt's significant other reports that he has been performing PROM to UEs while pt is in bed. OT performing PROM to B LEs and placing PRAFO boots back on pt at end of session. Pt sleeping sounds at this time. All needs within reach. Resting HR remains 135-140's while in bed.    Recommendations for follow up therapy are one component of a multi-disciplinary discharge planning process, led by the attending physician.  Recommendations may be updated based on patient status, additional functional criteria and insurance authorization.    Follow Up Recommendations  OT at [facility]-term acute care hospital     Assistance Recommended at Discharge Frequent or constant Supervision/Assistance     Equipment Recommendations  Other (comment) (defer to next venue of care)       Precautions / Restrictions Precautions Precautions: Fall Restrictions RLE Weight Bearing: Non weight bearing LLE Weight Bearing: Non weight bearing       Mobility Bed Mobility Overal bed mobility: Needs Assistance Bed  Mobility: Rolling Rolling: Total assist         General bed mobility comments: deferred 2/2 vent, flaccidity and resting HR 130-140    Transfers                   General transfer comment: not attempted secondary to lethargy         ADL either performed or assessed with clinical judgement   ADL                                         General ADL Comments: total dependent care at this time      Cognition Arousal/Alertness: Awake/alert Behavior During Therapy:  (nods head and speaks softly) Overall Cognitive Status: Impaired/Different from baseline                                 General Comments: P                   Pertinent Vitals/ Pain       Pain Assessment Pain Assessment: Faces Faces Pain Scale: No hurt         Frequency  Min 2X/week        Progress Toward Goals  OT Goals(current goals can now be found in the care plan section)  Progress towards OT goals: Progressing toward goals  Acute Rehab OT Goals Patient Stated Goal: to get stronger OT Goal Formulation: With family Time For Goal Achievement: 03/25/22 Potential to Achieve Goals: Fair  Plan Discharge plan remains appropriate;Frequency remains appropriate       AM-PAC OT "6 Clicks" Daily Activity     Outcome Measure   Help from another person eating meals?: Total Help from another person taking care of personal grooming?: Total Help from another person toileting, which includes using toliet, bedpan, or urinal?: Total Help from another person bathing (including washing, rinsing, drying)?: Total Help from another person to put on and taking off regular upper body clothing?: Total Help from another person to put on and taking off regular lower body clothing?: Total 6 Click Score: 6    End of Session    OT Visit Diagnosis: Muscle weakness (generalized) (M62.81);Unsteadiness on feet (R26.81)   Activity Tolerance Patient limited by lethargy    Patient Left in bed;with call bell/phone within reach;with family/visitor present   Nurse Communication Mobility status        Time: 1425-1447 OT Time Calculation (min): 22 min  Charges: OT General Charges $OT Visit: 1 Visit OT Treatments $Therapeutic Activity: 8-22 mins  Jackquline Denmark, MS, OTR/L , CBIS ascom [phone]  03/17/22, 4:05 PM

## 2022-03-18 DIAGNOSIS — G35 Multiple sclerosis: Secondary | ICD-10-CM | POA: Diagnosis not present

## 2022-03-18 DIAGNOSIS — D72821 Monocytosis (symptomatic): Secondary | ICD-10-CM

## 2022-03-18 DIAGNOSIS — I2699 Other pulmonary embolism without acute cor pulmonale: Secondary | ICD-10-CM | POA: Diagnosis not present

## 2022-03-18 DIAGNOSIS — J9601 Acute respiratory failure with hypoxia: Secondary | ICD-10-CM | POA: Diagnosis not present

## 2022-03-18 LAB — CBC
HCT: 28.8 % — ABNORMAL LOW (ref 36.0–46.0)
Hemoglobin: 8.5 g/dL — ABNORMAL LOW (ref 12.0–15.0)
MCH: 28.6 pg (ref 26.0–34.0)
MCHC: 29.5 g/dL — ABNORMAL LOW (ref 30.0–36.0)
MCV: 97 fL (ref 80.0–100.0)
Platelets: 375 10*3/uL (ref 150–400)
RBC: 2.97 MIL/uL — ABNORMAL LOW (ref 3.87–5.11)
RDW: 19.9 % — ABNORMAL HIGH (ref 11.5–15.5)
WBC: 17.5 10*3/uL — ABNORMAL HIGH (ref 4.0–10.5)
nRBC: 0 % (ref 0.0–0.2)

## 2022-03-18 LAB — ANTIPHOSPHOLIPID SYNDROME PROF
Anticardiolipin IgG: <9 GPL U/mL (ref 0–14)
Anticardiolipin IgM: <9 MPL U/mL (ref 0–12)
DRVVT: 47.8 s — ABNORMAL HIGH (ref 0.0–47.0)
PTT Lupus Anticoagulant: 51 s — ABNORMAL HIGH (ref 0.0–43.5)

## 2022-03-18 LAB — BASIC METABOLIC PANEL
Anion gap: 6 (ref 5–15)
BUN: 33 mg/dL — ABNORMAL HIGH (ref 6–20)
CO2: 30 mmol/L (ref 22–32)
Calcium: 10.2 mg/dL (ref 8.9–10.3)
Chloride: 108 mmol/L (ref 98–111)
Creatinine, Ser: 0.3 mg/dL — ABNORMAL LOW (ref 0.44–1.00)
GFR, Estimated: >60 mL/min (ref >60)
Glucose, Bld: 125 mg/dL — ABNORMAL HIGH (ref 70–99)
Potassium: 3.7 mmol/L (ref 3.5–5.1)
Sodium: 144 mmol/L (ref 135–145)

## 2022-03-18 LAB — GLUCOSE, CAPILLARY
Glucose-Capillary: 130 mg/dL — ABNORMAL HIGH (ref 70–99)
Glucose-Capillary: 126 mg/dL — ABNORMAL HIGH (ref 70–99)
Glucose-Capillary: 129 mg/dL — ABNORMAL HIGH (ref 70–99)
Glucose-Capillary: 136 mg/dL — ABNORMAL HIGH (ref 70–99)
Glucose-Capillary: 139 mg/dL — ABNORMAL HIGH (ref 70–99)

## 2022-03-18 LAB — DRVVT CONFIRM: dRVVT Confirm: 1.1 ratio (ref 0.8–1.2)

## 2022-03-18 LAB — HEXAGONAL PHASE PHOSPHOLIPID: Hexagonal Phase Phospholipid: 14 s — ABNORMAL HIGH (ref 0–11)

## 2022-03-18 LAB — CMV DNA BY PCR, QUALITATIVE: CMV DNA, Qual PCR: NEGATIVE

## 2022-03-18 LAB — PTT-LA MIX: PTT-LA Mix: 52.6 s — ABNORMAL HIGH (ref 0.0–40.5)

## 2022-03-18 LAB — DRVVT MIX: dRVVT Mix: 45.7 s — ABNORMAL HIGH (ref 0.0–40.4)

## 2022-03-18 MED ORDER — MELATONIN 5 MG PO TABS
5.0000 mg | ORAL_TABLET | Freq: Once | ORAL | Status: AC
  Administered 2022-03-18: 5 mg
  Filled 2022-03-18: qty 1

## 2022-03-18 NOTE — Progress Notes (Signed)
PHARMACY CONSULT NOTE  Pharmacy Consult for Electrolyte Monitoring and Replacement   Recent Labs: Potassium (mmol/L)  Date Value  03/18/2022 3.7   Magnesium (mg/dL)  Date Value  91/50/5697 2.4   Calcium (mg/dL)  Date Value  94/80/1655 10.2   Albumin (g/dL)  Date Value  37/48/2707 2.4 (L)   Phosphorus (mg/dL)  Date Value  86/75/4492 4.2   Sodium (mmol/L)  Date Value  03/18/2022 144   Assessment: 61 yo female presented to ED with SOB when lying flat and some right sided rib and shoulder pain.  Chest CT showed acute PE.  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 Pharmacy is asked to follow and replace electrolytes while in CCU.  Nutrition: Tube feeds at 9ml/h, FWF q4h  Goal of Therapy:  Electrolytes within normal limits  Plan:  --no BMP, no electrolyte replacement at this time --electrolytes have been stable, follow-up electrolytes with AM labs every other day  Lowella Bandy 03/18/2022 2:46 PM

## 2022-03-18 NOTE — TOC Progression Note (Signed)
Transition of Care [facility]) - Progression Note    Patient Details  Name: [name] MRN: [id number] Date of Birth: [birth year]  Transition of Care [facility] CM/SW Contact  Allayne Butcher, RN Phone Number: 03/18/2022, 2:59 PM  Clinical Narrative:    [facility] does not have a bed today but will have a bed for patient tomorrow.  Patient's sister updated on plan for transfer to [facility] tomorrow.    Expected Discharge Plan: Long Term Acute Care (LTAC) Barriers to Discharge: Continued Medical Work up  Expected Discharge Plan and Services Expected Discharge Plan: Long Term Acute Care (LTAC)   Discharge Planning Services: CM Consult Post Acute Care Choice: Long Term Acute Care (LTAC) Living arrangements for the past 2 months: Single Family Home                 DME Arranged: N/A DME Agency: NA       HH Arranged: NA HH Agency: NA         Social Determinants of Health (SDOH) Interventions    Readmission Risk Interventions     No data to display

## 2022-03-18 NOTE — Progress Notes (Signed)
Hematology/Oncology Progress note [phone] [phone]     Patient Care Team: [place] as PCP - General   Name of the patient: [name]  [id number]  [birth year]  Date of visit: 03/18/22   INTERVAL HISTORY-   Patient was extubated. 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  EBV negative.  CMV pending. BCR-ABL FISH negative.  Peripheral blood flow cytometry showed monocytosis 17% of the leukocytes, with immunophenotypic aberrancies-CD56 in a subset and down-regulation of CD13.  A nonspecific finding that can be seen in association with reactive/active processes as well as neoplastic processes.  No immature B cells detected in the specimen.    Allergies  Allergen Reactions   Erythromycin Hives and Nausea And Vomiting   Lexapro [Escitalopram Oxalate] Hives    Patient Active Problem List   Diagnosis Date Noted   Leucocytosis 03/13/2022   FUO (fever of unknown origin) 03/13/2022   Sepsis ([place]) 03/02/2022   Acute hypoxic respiratory failure ([place]) 03/02/2022   HAP (hospital-acquired pneumonia) 03/02/2022   Pulmonary hemorrhage 03/02/2022   Bilateral pulmonary embolism ([place]) 02/24/2022   Tachycardia 02/24/2022   Multiple sclerosis ([place]) 06/26/2021   Abdominal pain, LUQ (left upper quadrant) 06/10/2021   Chronic constipation 06/10/2021   Family history of pancreatic cancer 06/10/2021   Gastroesophageal reflux disease 06/10/2021   Low back pain 12/06/2020   Numbness 09/26/2020   Falls frequently 04/18/2020   Right leg pain 04/18/2020   RLS (restless legs syndrome) 04/18/2020   History of multiple sclerosis ([place]) 02/22/2020     Past Medical History:  Diagnosis Date   GERD (gastroesophageal reflux disease)    Multiple sclerosis ([place])      Past Surgical History:  Procedure Laterality Date   ESOPHAGOGASTRODUODENOSCOPY (EGD) WITH PROPOFOL N/A 09/11/2021   Procedure: ESOPHAGOGASTRODUODENOSCOPY (EGD) WITH PROPOFOL;  Surgeon:  Annamaria Helling, DO;  Location: [facility] ENDOSCOPY;  Service: Gastroenterology;  Laterality: N/A;   PULMONARY THROMBECTOMY Bilateral 02/25/2022   Procedure: PULMONARY THROMBECTOMY;  Surgeon: Algernon Huxley, MD;  Location: [place] [facility] LAB;  Service: Cardiovascular;  Laterality: Bilateral;   TUBAL LIGATION      Social History   Socioeconomic History   Marital status: Widowed    Spouse name: Not on file   Number of children: Not on file   Years of education: Not on file   Highest education level: Not on file  Occupational History   Not on file  Tobacco Use   Smoking status: Never   Smokeless tobacco: Never  Vaping Use   Vaping Use: Never used  Substance and Sexual Activity   Alcohol use: Yes    Comment: Social   Drug use: Never   Sexual activity: Not Currently  Other Topics Concern   Not on file  Social History Narrative   Not on file   Social Determinants of Health   Financial Resource Strain: Not on file  Food Insecurity: Not on file  Transportation Needs: Not on file  Physical Activity: Not on file  Stress: Not on file  Social Connections: Not on file  Intimate Partner Violence: Not on file     History reviewed. No pertinent family history.   Current Facility-Administered Medications:    0.9 %  sodium chloride infusion, 250 mL, Intravenous, Continuous, Flora Lipps, MD, Stopped at 03/09/22 0212   acetaminophen (TYLENOL) tablet 650 mg, 650 mg, Per Tube, Q6H PRN, Rust-Chester, Britton L, NP, 650 mg at 03/17/22 1936   apixaban (ELIQUIS) tablet 5 mg, 5 mg, Per  Tube, BID, Chawla, Harsh, MD, 5 mg at 03/17/22 2111   arformoterol (BROVANA) nebulizer solution 15 mcg, 15 mcg, Nebulization, BID, Chawla, Harsh, MD, 15 mcg at 03/18/22 1572   Chlorhexidine Gluconate Cloth 2 % PADS 6 each, 6 each, Topical, Daily, Algernon Huxley, MD, 6 each at 03/17/22 2111   cyanocobalamin (VITAMIN B12) tablet 1,000 mcg, 1,000 mcg, Per Tube, Daily, Dallie Piles, RPH, 1,000 mcg at 03/17/22  1041   feeding supplement (OSMOLITE 1.5 CAL) liquid 1,000 mL, 1,000 mL, Per Tube, Continuous, Flora Lipps, MD, Last Rate: 55 mL/hr at 03/18/22 0700, Infusion Verify at 03/18/22 0700   feeding supplement (PROSource TF20) liquid 60 mL, 60 mL, Per Tube, Daily, Chawla, Harsh, MD, 60 mL at 03/17/22 1041   fiber (NUTRISOURCE FIBER) 1 packet, 1 packet, Per Tube, TID, Wyvonnia Dusky, MD, 1 packet at 03/17/22 2114   free water 100 mL, 100 mL, Per Tube, Q4H, Wyvonnia Dusky, MD, 100 mL at 03/18/22 0414   insulin aspart (novoLOG) injection 0-15 Units, 0-15 Units, Subcutaneous, Q4H, Rust-Chester, Toribio Harbour L, NP, 2 Units at 03/18/22 0414   metoprolol tartrate (LOPRESSOR) injection 5 mg, 5 mg, Intravenous, Q8H PRN, Wyvonnia Dusky, MD, 5 mg at 03/17/22 1936   metoprolol tartrate (LOPRESSOR) tablet 25 mg, 25 mg, Per Tube, QID, Chawla, Harsh, MD, 25 mg at 03/18/22 0100   nutrition supplement (JUVEN) (JUVEN) powder packet 1 packet, 1 packet, Per Tube, BID BM, Chawla, Harsh, MD, 1 packet at 03/17/22 1543   Oral care mouth rinse, 15 mL, Mouth Rinse, 4 times per day, Dillard Cannon, Harsh, MD, 15 mL at 03/17/22 2112   Oral care mouth rinse, 15 mL, Mouth Rinse, PRN, Dillard Cannon, Harsh, MD   pantoprazole (PROTONIX) injection 40 mg, 40 mg, Intravenous, Q24H, Dgayli, Khabib, MD, 40 mg at 03/17/22 1041   revefenacin (YUPELRI) nebulizer solution 175 mcg, 175 mcg, Nebulization, Daily, Chawla, Harsh, MD, 175 mcg at 03/18/22 0723   Physical exam:  Vitals:   03/18/22 0600 03/18/22 0700 03/18/22 0723 03/18/22 0747  BP: 125/71 123/65    Pulse: (!) 125 (!) 122    Resp: (!) 25 (!) 23    Temp: 98.6 F (37 C)   (!) 97.5 F (36.4 C)  TempSrc: Oral   Oral  SpO2: 100% 94% 99%   Weight:      Height:       Physical Exam    Labs    Latest Ref Rng & Units 03/18/2022    5:51 AM 03/17/2022    6:45 AM 03/16/2022    3:24 AM  CBC  WBC 4.0 - 10.5 K/uL 17.5  17.7  22.0   Hemoglobin 12.0 - 15.0 g/dL 8.5  9.1  8.6   Hematocrit  36.0 - 46.0 % 28.8  30.4  28.7   Platelets 150 - 400 K/uL 375  363  359       Latest Ref Rng & Units 03/18/2022    5:51 AM 03/17/2022    6:45 AM 03/14/2022    5:04 AM  CMP  Glucose 70 - 99 mg/dL 125  132  145   BUN 6 - 20 mg/dL 33  27  27   Creatinine 0.44 - 1.00 mg/dL 0.30  0.34  0.39   Sodium 135 - 145 mmol/L 144  143  137   Potassium 3.5 - 5.1 mmol/L 3.7  3.9  4.4   Chloride 98 - 111 mmol/L 108  105  99   CO2 22 - 32 mmol/L  30  30  31   Calcium 8.9 - 10.3 mg/dL 10.2  10.0  8.8      RADIOGRAPHIC STUDIES: I have personally reviewed the radiological images as listed and agreed with the findings in the report. DG Abd 1 View  Result Date: 03/15/2022 CLINICAL DATA:  NG tube placement. EXAM: ABDOMEN - 1 VIEW COMPARISON:  CT 03/03/2022 FINDINGS: Tip of the enteric tube below the diaphragm in the stomach, the side port is just beyond the gastroesophageal junction. Nonspecific upper abdominal bowel gas pattern, generalized paucity of bowel gas. IMPRESSION: Tip of the enteric tube below the diaphragm in the stomach, side-port just beyond the gastroesophageal junction. Electronically Signed   By: Melanie  Sanford M.D.   On: 03/15/2022 11:32   CT HEAD WO CONTRAST (5MM)  Result Date: 03/14/2022 CLINICAL DATA:  Altered mental status, on heparin drip. History of micro hemorrhages previously EXAM: CT HEAD WITHOUT CONTRAST TECHNIQUE: Contiguous axial images were obtained from the base of the skull through the vertex without intravenous contrast. RADIATION DOSE REDUCTION: This exam was performed according to the departmental dose-optimization program which includes automated exposure control, adjustment of the mA and/or kV according to patient size and/or use of iterative reconstruction technique. COMPARISON:  03/09/2022 FINDINGS: Brain: No acute finding. No visible infarct or hemorrhage. No mass, hydrocephalus, or collection. Small ovoid low densities around the lateral ventricles correlating with history  of multiple sclerosis. Vascular: No hyperdense vessel or unexpected calcification. Skull: Normal. Negative for fracture or focal lesion. Sinuses/Orbits: No acute finding. IMPRESSION: No acute finding.  Negative for intracranial hemorrhage. Electronically Signed   By: Jonathan  Watts M.D.   On: 03/14/2022 12:23   DG Chest Port 1 View  Result Date: 03/14/2022 CLINICAL DATA:  Acute respiratory failure. Endotracheally intubated. EXAM: PORTABLE CHEST 1 VIEW COMPARISON:  03/08/2022 FINDINGS: Endotracheal tube and nasogastric tube remain in appropriate position. Airspace disease in both lower lobes shows mild improvement since previous exam. Ill-defined airspace opacity is seen in the right upper lobe which is new since prior exam. IMPRESSION: Mild improvement of bilateral lower lobe airspace disease. New ill-defined airspace opacity in right upper lobe. Electronically Signed   By: John A Stahl M.D.   On: 03/14/2022 09:49   US Venous Img Lower Bilateral (DVT)  Result Date: 03/13/2022 CLINICAL DATA:  Pulmonary embolism EXAM: BILATERAL LOWER EXTREMITY VENOUS DOPPLER ULTRASOUND TECHNIQUE: Gray-scale sonography with graded compression, as well as color Doppler and duplex ultrasound were performed to evaluate the lower extremity deep venous systems from the level of the common femoral vein and including the common femoral, femoral, profunda femoral, popliteal and calf veins including the posterior tibial, peroneal and gastrocnemius veins when visible. The superficial great saphenous vein was also interrogated. Spectral Doppler was utilized to evaluate flow at rest and with distal augmentation maneuvers in the common femoral, femoral and popliteal veins. COMPARISON:  CTA PE and CT AP, 03/03/2022. FINDINGS: RIGHT LOWER EXTREMITY VENOUS Normal compressibility of the RIGHT common femoral, superficial femoral, and popliteal veins. Visualized portions of profunda femoral vein and great saphenous vein unremarkable. Non  compressibility with filling defect within the imaged RIGHT peroneal vein. OTHER No evidence of superficial thrombophlebitis or abnormal fluid collection. Limitations: Patient body habitus LEFT LOWER EXTREMITY VENOUS Normal compressibility of the LEFT common femoral vein. Homogeneously echogenic, occlusive and non-compressible filling defect within the imaged portions of the LEFT profunda, SFV, popliteal and imaged portions of the LEFT calf veins. OTHER No evidence of superficial thrombophlebitis or abnormal fluid collection. Limitations: Patient   body habitus IMPRESSION: Examination is POSITIVE for bilateral lower extremity DVT, greater on LEFT with femoropopliteal involvement and within the RIGHT peroneal vein. Jon Mugweru, MD [facility] Electronically Signed   By: Jon  Mugweru M.D.   On: 03/13/2022 10:10   MR BRAIN W WO CONTRAST  Result Date: 03/09/2022 CLINICAL DATA:  Mental status change, unknown cause. Acute metabolic encephalopathy. History of COVID and pulmonary embolism requiring thrombectomy on 02/25/2022. History of multiple sclerosis. EXAM: MRI HEAD WITHOUT AND WITH CONTRAST TECHNIQUE: Multiplanar, multiecho pulse sequences of the brain and surrounding structures were obtained without and with intravenous contrast. CONTRAST:  7mL GADAVIST GADOBUTROL 1 MMOL/ML IV SOLN COMPARISON:  Head CT 03/07/2022 and MRI 09/23/2021 FINDINGS: Some sequences are mildly to moderately motion degraded. Brain: There is no evidence of an acute infarct, intracranial hemorrhage, mass, midline shift, or extra-axial fluid collection. There is mild cerebral atrophy. There are there are multiple new foci of chronic microhemorrhage scattered throughout both cerebral hemispheres (including in the splenium of the corpus callosum), and there are also multiple new small foci of chronic hemorrhage in both cerebellar hemispheres. Punctate foci of susceptibility are also noted  in the occipital horns of the lateral ventricles. There is no associated enhancement. Numerous foci of T2 FLAIR hyperintensity in the juxtacortical, deep, and periventricular white matter bilaterally have not appreciably changed from the prior MRI. Small lesions in the midbrain, pons, and possibly right cerebellum are also grossly unchanged within limitations of motion artifact. Multiple black holes are noted on T1 weighted images. No lesions demonstrate restricted diffusion or enhancement. Vascular: Major intracranial vascular flow voids are preserved. Skull and upper cervical spine: Unremarkable bone marrow signal. Sinuses/Orbits: Unremarkable orbits. Prominent mucosal thickening in the right maxillary sinus. Moderate left mastoid effusion. Other: None. IMPRESSION: 1. Motion degraded examination.  No acute infarct. 2. Numerous foci of chronic cerebral and cerebellar microhemorrhage which are new from 09/23/2021, of indeterminate etiology. Considerations include emboli, sequelae of COVID infection or other critical illness, and vasculitis. There are also trace chronic blood products or other debris in the occipital horns of the lateral ventricles. 3. Unchanged white matter disease consistent with multiple sclerosis. No evidence of active demyelination. Electronically Signed   By: Allen  Grady M.D.   On: 03/09/2022 16:26   EEG adult  Result Date: 03/09/2022 Yadav, Priyanka O, MD     03/09/2022  2:24 PM Patient Name: Zeyna Alwin MRN: [id number] Epilepsy Attending: Priyanka O Yadav Referring Physician/Provider: Keene, Jeremiah D, NP Date: 03/09/2022 Duration: 27.49 mins Patient history: 60-year-old female with altered mental status.  EEG to evaluate for seizure. Level of alertness: Awake, asleep AEDs during EEG study: None Technical aspects: This EEG study was done with scalp electrodes positioned according to the 10-20 International system of electrode placement. Electrical activity was reviewed with  band pass filter of 1-70Hz, sensitivity of 7 uV/mm, display speed of 30mm/sec with a 60Hz notched filter applied as appropriate. EEG data were recorded continuously and digitally stored.  Video monitoring was available and reviewed as appropriate. Description: The posterior dominant rhythm consists of 8-9 Hz activity of moderate voltage (25-35 uV) seen predominantly in posterior head regions, symmetric and reactive to eye opening and eye closing. Sleep was characterized by vertex waves, sleep spindles (12 to 14 Hz), maximal frontocentral region. EEG showed intermittent generalized 3 to 6 Hz theta-delta slowing. Hyperventilation and photic stimulation were not performed.   ABNORMALITY - Intermittent slow, generalized IMPRESSION: This study is suggestive of mild diffuse   encephalopathy, nonspecific etiology. No seizures or epileptiform discharges were seen throughout the recording. Priyanka O Yadav   US RENAL  Result Date: 03/08/2022 CLINICAL DATA:  Fever EXAM: RENAL / URINARY TRACT ULTRASOUND COMPLETE COMPARISON:  None Available. FINDINGS: Right Kidney: Renal measurements: 9.9 x 4.5 x 5.3 cm = volume: 121.4 mL. Echogenicity within normal limits. No mass or hydronephrosis visualized. Left Kidney: Renal measurements: 10.1 x 5.2 x 5.6 cm = volume: 153.1 mL. Echogenicity within normal limits. No mass or hydronephrosis visualized. Bladder: Urinary bladder is collapsed about the Foley's catheter. Other: None. IMPRESSION: 1. No hydronephrosis. 2. Urinary bladder is collapsed about the Foley's catheter. Electronically Signed   By: Keane Police D.O.   On: 03/08/2022 21:33   DG Chest Port 1 View  Result Date: 03/08/2022 CLINICAL DATA:  Chronic dyspnea.  History of bilateral PEs. EXAM: PORTABLE CHEST 1 VIEW COMPARISON:  03/06/2022 FINDINGS: Tracheostomy tube tip is stable above the carina. There is an enteric tube with tip and side port below the GE junction. Stable cardiomediastinal contours. Bilateral interstitial  and airspace opacities are again noted, not significantly improved from previous exam. IMPRESSION: 1. No significant change in aeration to the lungs compared with previous exam. 2. Stable support apparatus. Electronically Signed   By: Kerby Moors M.D.   On: 03/08/2022 09:05   CT HEAD WO CONTRAST (5MM)  Result Date: 03/07/2022 CLINICAL DATA:  Altered mental status EXAM: CT HEAD WITHOUT CONTRAST TECHNIQUE: Contiguous axial images were obtained from the base of the skull through the vertex without intravenous contrast. RADIATION DOSE REDUCTION: This exam was performed according to the departmental dose-optimization program which includes automated exposure control, adjustment of the mA and/or kV according to patient size and/or use of iterative reconstruction technique. COMPARISON:  Brain MRI 09/23/2021 FINDINGS: Brain: There is no acute intracranial hemorrhage, extra-axial fluid collection, or acute infarct. Parenchymal volume is normal. The ventricles are normal in size. Gray-white differentiation is preserved. Hypodense lesions in the supratentorial white matter consistent with the history of multiple sclerosis are noted but better seen on prior MRI. There is no mass lesion.  There is no mass effect or midline shift. Vascular: No hyperdense vessel or unexpected calcification. Skull: Normal. Negative for fracture or focal lesion. Sinuses/Orbits: There is opacification of the imaged portion of the right maxillary sinus with a small amount of layering fluid in the imaged left maxillary sinus. There is a dysconjugate gaze. The globes and orbits are otherwise unremarkable. Other: There is a left mastoid effusion. IMPRESSION: 1. No acute intracranial pathology. 2. Dysconjugate gaze. 3. Hypodense lesions in the supratentorial white matter consistent with multiple sclerosis, better seen on prior MRI. 4. Paranasal sinus disease with layering fluid in the imaged left maxillary sinus which could reflect acute sinusitis  in the correct clinical setting. 5. Left mastoid effusion. Electronically Signed   By: Valetta Mole M.D.   On: 03/07/2022 18:54   DG Chest Port 1 View  Result Date: 03/06/2022 CLINICAL DATA:  Dyspnea EXAM: PORTABLE CHEST 1 VIEW COMPARISON:  Chest radiograph dated 03/03/2022. FINDINGS: The heart size and mediastinal contours are within normal limits. Moderate to severe bilateral interstitial and airspace opacities appear increased from prior exam. A small right pleural effusion likely contributes. There is no left pleural effusion. There is no pneumothorax on either side. An endotracheal tube terminates in the upper thoracic trachea. An enteric tube terminates in the stomach. The osseous structures are unremarkable. IMPRESSION: Moderate to severe bilateral interstitial and airspace opacities appear increased from  prior exam. A small right pleural effusion likely contributes. Electronically Signed   By: Tyler  Litton M.D.   On: 03/06/2022 08:17   CT Angio Chest Pulmonary Embolism (PE) W or WO Contrast  Result Date: 03/03/2022 CLINICAL DATA:  Pulmonary embolism (PE) suspected, high prob EXAM: CT ANGIOGRAPHY CHEST WITH CONTRAST TECHNIQUE: Multidetector CT imaging of the chest was performed using the standard protocol during bolus administration of intravenous contrast. Multiplanar CT image reconstructions and MIPs were obtained to evaluate the vascular anatomy. RADIATION DOSE REDUCTION: This exam was performed according to the departmental dose-optimization program which includes automated exposure control, adjustment of the mA and/or kV according to patient size and/or use of iterative reconstruction technique. CONTRAST:  100mL OMNIPAQUE IOHEXOL 350 MG/ML SOLN COMPARISON:  02/24/2022 FINDINGS: Cardiovascular: Bilateral lower lobe pulmonary emboli are noted. Overall clot burden appears slightly reduced when compared to prior study. Elevated RV to LV ratio of 1.42, similar to prior study compatible with right  heart dysfunction. Mediastinum/Nodes: Esophagus is dilated and fluid-filled. NG tube is in place terminating in the stomach. Endotracheal tube tip is just above the carina. No mediastinal, hilar, or axillary adenopathy. Lungs/Pleura: Extensive bilateral lower lobe airspace disease and ground-glass opacities in the upper lobes. Findings are worsening since prior study. Trace right pleural effusion. Upper Abdomen: Fluid-filled stomach. Gaseous distention of the colon. See abdominal CT report for further discussion. Musculoskeletal: No acute bony abnormality. Review of the MIP images confirms the above findings. IMPRESSION: Bilateral pulmonary emboli. Overall clot burden slightly decreased since previous study. Continued elevated RV to LV ratio of 1.42, similar to prior study compatible with right heart dysfunction. Worsening bilateral airspace disease, most confluent in the lower lobes. Trace right pleural effusion. Distended, fluid-filled stomach and esophagus. Esophageal fluid likely related to reflux. NG tube is in the stomach. Electronically Signed   By: Kevin  Dover M.D.   On: 03/03/2022 22:18   CT ABDOMEN PELVIS W CONTRAST  Result Date: 03/03/2022 CLINICAL DATA:  Sepsis EXAM: CT ABDOMEN AND PELVIS WITH CONTRAST TECHNIQUE: Multidetector CT imaging of the abdomen and pelvis was performed using the standard protocol following bolus administration of intravenous contrast. RADIATION DOSE REDUCTION: This exam was performed according to the departmental dose-optimization program which includes automated exposure control, adjustment of the mA and/or kV according to patient size and/or use of iterative reconstruction technique. CONTRAST:  100mL OMNIPAQUE IOHEXOL 350 MG/ML SOLN COMPARISON:  06/26/2021 FINDINGS: Lower chest: Bilateral lower lobe pulmonary emboli noted. Bilateral lower lobe airspace disease. See chest CT report for further discussion. Hepatobiliary: No focal hepatic abnormality. Gallbladder  unremarkable. Pancreas: No focal abnormality or ductal dilatation. Spleen: No focal abnormality.  Normal size. Adrenals/Urinary Tract: No adrenal abnormality. No focal renal abnormality. No stones or hydronephrosis. Urinary bladder is unremarkable. Stomach/Bowel: Diffuse gaseous distention of the colon, likely ileus. Stomach and small bowel decompressed, grossly unremarkable. NG tube tip is in the distal stomach. Vascular/Lymphatic: No evidence of aneurysm or adenopathy. Reproductive: Uterus and adnexa unremarkable.  No mass. Other: No free fluid or free air. Musculoskeletal: No acute bony abnormality. IMPRESSION: Bilateral lower lobe pulmonary emboli with bilateral airspace disease. See chest CT for further discussion. Gaseous distention of the colon diffusely suggests ileus. No evidence of bowel obstruction. NG tube in the stomach. Electronically Signed   By: Kevin  Dover M.D.   On: 03/03/2022 22:12   DG Chest Port 1 View  Result Date: 03/03/2022 CLINICAL DATA:  History of PE EXAM: PORTABLE CHEST 1 VIEW COMPARISON:  Chest x-ray dated March 10, 2022 FINDINGS: Cardiac and mediastinal contours are within normal limits unchanged position of the ET tube and gastric decompression tube. Unchanged left-greater-than-right heterogeneous opacities. No evidence of pleural effusion or pneumothorax. IMPRESSION: 1. Unchanged left-greater-than-right heterogeneous opacities. 2. Unchanged position of ET tube and gastric decompression tube. Electronically Signed   By: [name]  Strickland M.D.   On: 03/03/2022 13:56   DG Chest Port 1 View  Result Date: 03/02/2022 CLINICAL DATA:  [id number] Acute respiratory failure with hypoxia (HCC) [id number] EXAM: PORTABLE CHEST 1 VIEW COMPARISON:  Chest radiograph from one day prior. FINDINGS: Endotracheal tube tip is 5.5 cm above the carina. Enteric tube enters stomach with the tip not seen on this image. Stable cardiomediastinal silhouette with normal heart size. No pneumothorax. No pleural  effusion. Patchy hazy opacities in the mid to lower lungs bilaterally, left greater than right, slightly improved. IMPRESSION: 1. Well-positioned endotracheal and enteric tubes. No pneumothorax. 2. Patchy hazy opacities in the mid to lower lungs bilaterally, left greater than right, slightly improved, favor improving pneumonia. Electronically Signed   By: Jason A Poff M.D.   On: 03/02/2022 08:25   DG Chest Port 1 View  Result Date: 03/01/2022 CLINICAL DATA:  Acute respiratory failure, hypoxia EXAM: PORTABLE CHEST 1 VIEW COMPARISON:  Prior chest x-ray 02/28/2022 FINDINGS: The endotracheal tube is 3.6 cm above the carina. Gastric tube remains in unchanged position. Similar appearance of the lungs with diffuse bilateral interstitial and airspace opacities more confluent on the left than the right. No pneumothorax. No large effusion. Stable cardiomegaly. IMPRESSION: 1. Overall, similar appearance of the lungs with diffuse bilateral interstitial and airspace opacities more confluent on the left than the right. As before, this could represent pulmonary edema, multifocal pneumonia, or ARDS. 2. Stable and satisfactory support apparatus. Electronically Signed   By: Heath  McCullough M.D.   On: 03/01/2022 07:06   DG Chest Port 1 View  Result Date: 02/28/2022 CLINICAL DATA:  5727 through 3 with hypoxic respiratory failure, ventilator dependent. EXAM: PORTABLE CHEST 1 VIEW COMPARISON:  Portable chest yesterday at 8:39 a.m. FINDINGS: 5:08 a.m. ETT tip is 3.9 cm from the carina. Interval NGT insertion which is well inside the stomach but neither the side-hole or tip are filmed. Multiple overlying monitor wires. The heart is enlarged. Central vascular distension is still seen but has improved mildly. Widespread interstitial and patchy alveolar consolidative opacities are again noted, with relative apical sparing. The opacities are increasingly dense in the left midlung today, and elsewhere minimally less dense than  previously. There are minimal pleural effusions. Findings could be due to edema, pneumonia or combination. Mediastinal configuration is stable. No acute osseous findings. IMPRESSION: 1. Support apparatus as above. 2. Slight improvement in central vascular distension. 3. Widespread interstitial and patchy alveolar consolidative opacities, with increasingly dense opacities in the left mid lung today, and elsewhere minimally less dense than previously. Findings could be due to edema, pneumonia or combination. Electronically Signed   By: Keith  Chesser M.D.   On: 02/28/2022 07:16   DG Abd 1 View  Result Date: 02/27/2022 CLINICAL DATA:  252332 Encounter for orogastric (OG) tube placement 252332 EXAM: ABDOMEN - 1 VIEW COMPARISON:  None Available. FINDINGS: Orogastric tube tip and side port overlie the stomach. Nonobstructive bowel gas pattern. IMPRESSION: Orogastric tube tip and side port overlie the stomach. Electronically Signed   By: Jacob  Kahn M.D.   On: 02/27/2022 12:27   DG Chest Port 1 View  Result Date: 02/27/2022 CLINICAL DATA:  Acute respiratory failure EXAM:   PORTABLE CHEST 1 VIEW COMPARISON:  02/25/22 CXR FINDINGS: Endotracheal tube terminates approximately 4 cm above the carina. Compared to prior exam there is interval increase in bilateral patchy pulmonary opacities. Cardiac and mediastinal contours are unchanged from prior exam. Visualized upper abdomen notable for a distended stomach, slightly improved from prior. No displaced rib fractures. IMPRESSION: 1. Endotracheal tube terminates approximately 4 cm above the carina. 2. Interval increase in bilateral patchy pulmonary opacities, nonspecific, but worrisome for multifocal infection. Electronically Signed   By: Hemant  Desai M.D.   On: 02/27/2022 09:08   DG Chest Port 1 View  Result Date: 02/25/2022 CLINICAL DATA:  Respiratory failure, EXAM: PORTABLE CHEST 1 VIEW COMPARISON:  02/25/2022 FINDINGS: Endotracheal tube seen 3.5 cm above the  carina. Pulmonary insufflation has slightly diminished since prior examination but remains normal and symmetric. Bibasilar pulmonary infiltrates have progressed in the interval in keeping with infection or inflammation. No pneumothorax or pleural effusion. Cardiac size within normal limits. No acute bone abnormality. IMPRESSION: 1. Endotracheal tube in appropriate position. 2. Progressive bibasilar pulmonary infiltrates in keeping with infection or inflammation. Electronically Signed   By: Ashesh  Parikh M.D.   On: 02/25/2022 19:41   DG Chest Port 1 View  Result Date: 02/25/2022 CLINICAL DATA:  Hypoxia, intubated EXAM: PORTABLE CHEST 1 VIEW COMPARISON:  02/24/2022 FINDINGS: Single frontal view of the chest demonstrates endotracheal tube overlying tracheal air column, tip midway between thoracic inlet and carina. Stable small right pleural effusion. There is bibasilar consolidation, with marked progression on the left since previous exams. Given findings of concurrent pulmonary embolus, pulmonary infarct could be considered though aspiration, pneumonia, and edema could give a similar pattern. No pneumothorax. Cardiac silhouette is unremarkable. No acute bony abnormalities. IMPRESSION: 1. Endotracheal tube as above. 2. Progressive bibasilar airspace disease, left greater than right. This could reflect developing pulmonary infarcts given history of pulmonary emboli, though aspiration, edema, and infection could give a similar pattern. 3. Stable small right pleural effusion. Electronically Signed   By: Michael  Brown M.D.   On: 02/25/2022 16:24   PERIPHERAL VASCULAR CATHETERIZATION  Result Date: 02/25/2022 See surgical note for result.  ECHOCARDIOGRAM COMPLETE  Result Date: 02/24/2022    ECHOCARDIOGRAM REPORT   Patient Name:   Emmabelle Lillo Date of Exam: 02/24/2022 Medical Rec #:  [id number]             Height:       62.0 in Accession #:    [id number]            Weight:       126.1 lb Date of  Birth:  09/08/1960             BSA:          1.571 m Patient Age:    60 years              BP:           155/65 mmHg Patient Gender: F                     HR:           150 bpm. Exam Location:  [facility] Procedure: 2D Echo, Cardiac Doppler and Color Doppler Indications:     I26.09 Pulmonary Embolus  History:         Patient has no prior history of Echocardiogram examinations.                  Multiple Sclerosis.  Sonographer:       NaTashia Rodgers-Jones RDCS Referring Phys:  [id number] AMY N [facility] Diagnosing Phys: Dwayne D Callwood MD IMPRESSIONS  1. Left ventricular ejection fraction, by estimation, is 60 to 65%. The left ventricle has normal function. The left ventricle has no regional wall motion abnormalities. Left ventricular diastolic parameters were normal.  2. Right ventricular systolic function is normal. The right ventricular size is normal.  3. The mitral valve is normal in structure. Trivial mitral valve regurgitation.  4. The aortic valve is normal in structure. Aortic valve regurgitation is not visualized. FINDINGS  Left Ventricle: Left ventricular ejection fraction, by estimation, is 60 to 65%. The left ventricle has normal function. The left ventricle has no regional wall motion abnormalities. The left ventricular internal cavity size was normal in size. There is  no left ventricular hypertrophy. Left ventricular diastolic parameters were normal. Right Ventricle: The right ventricular size is normal. No increase in right ventricular wall thickness. Right ventricular systolic function is normal. Left Atrium: Left atrial size was normal in size. Right Atrium: Right atrial size was normal in size. Pericardium: There is no evidence of pericardial effusion. Mitral Valve: The mitral valve is normal in structure. Trivial mitral valve regurgitation. Tricuspid Valve: The tricuspid valve is normal in structure. Tricuspid valve regurgitation is not demonstrated. Aortic Valve: The aortic valve is normal in structure.  Aortic valve regurgitation is not visualized. Pulmonic Valve: The pulmonic valve was normal in structure. Pulmonic valve regurgitation is not visualized. Aorta: The ascending aorta was not well visualized. IAS/Shunts: No atrial level shunt detected by color flow Doppler.  LEFT VENTRICLE PLAX 2D LVIDd:         3.70 cm LVIDs:         2.50 cm LV PW:         0.80 cm LV IVS:        0.80 cm LVOT diam:     1.70 cm LVOT Area:     2.27 cm  RIGHT VENTRICLE RV Basal diam:  3.70 cm RV S prime:     34.60 cm/s TAPSE (M-mode): 2.3 cm LEFT ATRIUM             Index        RIGHT ATRIUM          Index LA diam:        3.60 cm 2.29 cm/m   RA Area:     9.98 cm LA Vol (A2C):   19.7 ml 12.54 ml/m  RA Volume:   22.70 ml 14.45 ml/m LA Vol (A4C):   22.9 ml 14.57 ml/m LA Biplane Vol: 22.8 ml 14.51 ml/m   AORTA Ao Root diam: 3.00 cm Ao Asc diam:  3.10 cm TRICUSPID VALVE TR Peak grad:   49.3 mmHg TR Vmax:        351.00 cm/s  SHUNTS Systemic Diam: 1.70 cm Dwayne D Callwood MD Electronically signed by Dwayne D Callwood MD Signature Date/Time: 02/24/2022/11:42:30 PM    Final    US Venous Img Lower Bilateral (DVT)  Result Date: 02/24/2022 CLINICAL DATA:  History of pulmonary embolus. EXAM: BILATERAL LOWER EXTREMITY VENOUS DOPPLER ULTRASOUND TECHNIQUE: Gray-scale sonography with graded compression, as well as color Doppler and duplex ultrasound were performed to evaluate the lower extremity deep venous systems from the level of the common femoral vein and including the common femoral, femoral, profunda femoral, popliteal and calf veins including the posterior tibial, peroneal and gastrocnemius veins when visible. The superficial great saphenous vein was also interrogated. Spectral Doppler was utilized to   evaluate flow at rest and with distal augmentation maneuvers in the common femoral, femoral and popliteal veins. COMPARISON:  CT scan of same day. FINDINGS: RIGHT LOWER EXTREMITY Common Femoral Vein: No evidence of thrombus. Normal  compressibility, respiratory phasicity and response to augmentation. Saphenofemoral Junction: No evidence of thrombus. Normal compressibility and flow on color Doppler imaging. Profunda Femoral Vein: No evidence of thrombus. Normal compressibility and flow on color Doppler imaging. Femoral Vein: No evidence of thrombus. Normal compressibility, respiratory phasicity and response to augmentation. Popliteal Vein: No evidence of thrombus. Normal compressibility, respiratory phasicity and response to augmentation. Calf Veins: There appears to be occlusive thrombus in the peroneal vein. Venous Reflux:  None. Other Findings:  None. LEFT LOWER EXTREMITY Common Femoral Vein: No evidence of thrombus. Normal compressibility, respiratory phasicity and response to augmentation. Saphenofemoral Junction: No evidence of thrombus. Normal compressibility and flow on color Doppler imaging. Profunda Femoral Vein: No evidence of thrombus. Normal compressibility and flow on color Doppler imaging. Femoral Vein: No evidence of thrombus. Normal compressibility, respiratory phasicity and response to augmentation. Popliteal Vein: No evidence of thrombus. Normal compressibility, respiratory phasicity and response to augmentation. Calf Veins: There appears to be occlusive thrombus in the peroneal vein. Superficial Great Saphenous Vein: No evidence of thrombus. Normal compressibility. Venous Reflux:  None. Other Findings:  None. IMPRESSION: Occlusive thrombus is noted in the peroneal veins bilaterally. Electronically Signed   By: Marijo Conception M.D.   On: 02/24/2022 18:54   CT Angio Chest PE W and/or Wo Contrast  Result Date: 02/24/2022 CLINICAL DATA:  Shortness of breath, right chest pain EXAM: CT ANGIOGRAPHY CHEST WITH CONTRAST TECHNIQUE: Multidetector CT imaging of the chest was performed using the standard protocol during bolus administration of intravenous contrast. Multiplanar CT image reconstructions and MIPs were obtained to  evaluate the vascular anatomy. RADIATION DOSE REDUCTION: This exam was performed according to the departmental dose-optimization program which includes automated exposure control, adjustment of the mA and/or kV according to patient size and/or use of iterative reconstruction technique. CONTRAST:  50m OMNIPAQUE IOHEXOL 350 MG/ML SOLN COMPARISON:  Chest radiograph done earlier today FINDINGS: Cardiovascular: Heart is enlarged in size. RV LV ratio is 1.3. This may be recent or chronic. There is homogeneous enhancement in thoracic aorta. There are filling defects in segmental and subsegmental branches in right upper lobe, right middle lobe, right lower lobe, left upper lobe and left lower lobe. There is moderate thrombus burden. Mediastinum/Nodes: No significant lymphadenopathy seen. Lungs/Pleura: There are patchy infiltrates in parahilar regions and lower lung fields, more so in right lower lobe. There is small right pleural effusion. There is no pneumothorax. Upper Abdomen: There is moderate to large sized fixed hiatal hernia. Musculoskeletal: No acute findings are seen. Review of the MIP images confirms the above findings. IMPRESSION: Acute PE with moderate thrombus burden involving both lungs. RV-LV ratio is 1.3 suggesting acute or chronic right heart dysfunction. Small right pleural effusion. There are patchy infiltrates in parahilar regions and both lower lung fields, more so in right lower lobe suggesting atelectasis/pneumonia. Part of this finding could be due to developing pulmonary infarcts. Moderate to large sized fixed hiatal hernia. Imaging findings were relayed to patient's provider Dr. MMarjean Donnaby telephone call. Electronically Signed   By: PElmer PickerM.D.   On: 02/24/2022 17:13   DG Chest 2 View  Result Date: 02/24/2022 CLINICAL DATA:  Short of breath EXAM: CHEST - 2 VIEW COMPARISON:  None Available. FINDINGS: Heart size and vascularity normal. Negative for  heart failure. Mild bibasilar  airspace disease likely atelectasis. Small right pleural effusion. IMPRESSION: Mild bibasilar atelectasis.  Small right pleural effusion Electronically Signed   By: Charles  Clark M.D.   On: 02/24/2022 16:05    Assessment and plan-   # Leukocytosis, predominantly neutrophilia and monocytosis. Not responding to antibiotics. Peripheral blood smear showed small amount of blast which is nonspecific. Peripheral flow cytometry showed monocytosis, reactive versus underlying neoplasm or both. No increase of blasts.  Less likely acute leukemia. 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  Recommend bone marrow biopsy for further evaluation. I discussed with patient's Sister Ruth and she would like to defer bone marrow biopsy until outpatient. Leukocytosis has improved.  She is off antibiotics and so far no fever.  #bilateral pulmonary embolism, possibly provoked by COVID-19 infection.+ proximal DVT bilaterally  Status post thrombectomy 11/15, repeat CT scan decreased clot burden. Microbleeds on MRI brain.  Cleared by neurology to start her on anticoagulation.  Repeat CT scan 24 hours after after anticoagulation was stable. Continue anticoagulation, currently on Eliquis 5 mg twice daily.  Patient can follow-up outpatient with oncology to further discuss with bone marrow biopsy, follow-up labs Thank you for allowing me to participate in the care of this patient.   Zhou Yu, MD, PhD Hematology Oncology 03/18/2022  

## 2022-03-18 NOTE — Progress Notes (Signed)
Physical Therapy Treatment Patient Details Name: [name] MRN: [id number] DOB: [birth year] Today's Date: 03/18/2022   History of Present Illness 61 year old female with history of MS, hypertension, depression, anxiety, restless leg syndrome, GERD, who presented to emergency department for chief concerns of shortness of breath. Pt found to be positive for covid with b/l PE and DVT noted.  Now s/p thrombectomy and multiple bronchoscopy procedures. unable to wean from the ventilator. MRI imaging of the brain showed chronic cerebral and cerebellar micro-hemorrhages.    PT Comments    Significant other present and again educated on HEP, reports she has been very sleepy today but that he did some calf stretches with her this AM.  P/AAROM attempting to facilitate/encourage some motion.  Again tone/reflexive responses with multiple movements but very limited signs of muscle engagement.  Did have volitional elbow extension on occasion (L>R).  Continues to be very pleasant and engaged with PT but functionally very limited.     Recommendations for follow up therapy are one component of a multi-disciplinary discharge planning process, led by the attending physician.  Recommendations may be updated based on patient status, additional functional criteria and insurance authorization.  Follow Up Recommendations  Long-term institutional care without follow-up therapy Can patient physically be transported by private vehicle: No   Assistance Recommended at Discharge Frequent or constant Supervision/Assistance  Patient can return home with the following Two people to help with walking and/or transfers;Two people to help with bathing/dressing/bathroom;Assistance with cooking/housework;Assistance with feeding   Equipment Recommendations       Recommendations for Other Services       Precautions / Restrictions Precautions Precautions: Fall Restrictions Weight Bearing Restrictions: No      Mobility  Bed Mobility               General bed mobility comments: deferred due to severity of weakness    Transfers                        Ambulation/Gait                   Stairs             Wheelchair Mobility    Modified Rankin (Stroke Patients Only)       Balance                                            Cognition Arousal/Alertness: Awake/alert Behavior During Therapy: WFL for tasks assessed/performed Overall Cognitive Status: Impaired/Different from baseline                                          Exercises Other Exercises Other Exercises: PROM exercises for U&LEs:  pt unable to show any AROM strength but did have ability to assist with b/l elbow extension (L>R) on a few reps with elbow AA/PROM.  unable to grip. ankle PF/DF stretch/ROM, hip Ab/Ad (less signs of hip Abd "pushes" today) heel slides. all PROM with very rare possible AAROM despite much encouragement (and apparent pt effort). Clonus/reflexive tone responses with some exercises (R quad, L ankle most noted).  At least 10 of each exercises with consistent cuing and pulsed ROM and muscle mass tapping with attempt to illicit some response. Other Exercises: Signficant  other present, discussed hand out for LE supine exericses/stretches/PROM.  Explanation along with education on positioning and strategies for HEP.    General Comments        Pertinent Vitals/Pain Pain Assessment Pain Assessment: Faces Pain Location: reports no pain on arrival, indicates mild pain with calf stretches    Home Living                          Prior Function            PT Goals (current goals can now be found in the care plan section) Progress towards PT goals:  (remains nearly flaccid t/o)    Frequency    Min 2X/week      PT Plan Current plan remains appropriate    Co-evaluation              AM-PAC PT "6 Clicks" Mobility    Outcome Measure  Help needed turning from your back to your side while in a flat bed without using bedrails?: Total Help needed moving from lying on your back to sitting on the side of a flat bed without using bedrails?: Total Help needed moving to and from a bed to a chair (including a wheelchair)?: Total Help needed standing up from a chair using your arms (e.g., wheelchair or bedside chair)?: Total Help needed to walk in hospital room?: Total Help needed climbing 3-5 steps with a railing? : Total 6 Click Score: 6    End of Session   Activity Tolerance: Patient tolerated treatment well Patient left: in bed;with call bell/phone within reach Nurse Communication: Mobility status PT Visit Diagnosis: Muscle weakness (generalized) (M62.81);Other symptoms and signs involving the nervous system (I34.742)     Time: 5956-3875 PT Time Calculation (min) (ACUTE ONLY): 19 min  Charges:  $Therapeutic Exercise: 8-22 mins                     Malachi Pro, DPT 03/18/2022, 5:42 PM

## 2022-03-18 NOTE — Progress Notes (Signed)
       CROSS COVER NOTE  NAME: Justice Aguirre MRN: [id number] DOB : 1960/12/03 ATTENDING PHYSICIAN: Charise Killian, MD    Date of Service   03/18/2022   HPI/Events of Note   Medication request received for sleep aid.  Interventions   Assessment/Plan:  Melatonin     This document was prepared using Dragon voice recognition software and may include unintentional dictation errors.  Bishop Limbo DNP, MBA, FNP-BC Nurse Practitioner [facility] Pager [phone]

## 2022-03-18 NOTE — TOC Progression Note (Signed)
Transition of Care [facility]) - Progression Note    Patient Details  Name: [name] MRN: [id number] Date of Birth: [birth year]  Transition of Care [facility] CM/SW Contact  Allayne Butcher, RN Phone Number: 03/18/2022, 9:00 AM  Clinical Narrative:    Patient's sister has decided on [facility] after touring last night.  Irving Burton notified that sister chooses [facility], she will let me know if they have a bed available today, if not today then definitely tomorrow.     Expected Discharge Plan: Long Term Acute Care (LTAC) Barriers to Discharge: Continued Medical Work up  Expected Discharge Plan and Services Expected Discharge Plan: Long Term Acute Care (LTAC)   Discharge Planning Services: CM Consult Post Acute Care Choice: Long Term Acute Care (LTAC) Living arrangements for the past 2 months: Single Family Home                 DME Arranged: N/A DME Agency: NA       HH Arranged: NA HH Agency: NA         Social Determinants of Health (SDOH) Interventions    Readmission Risk Interventions     No data to display

## 2022-03-18 NOTE — Progress Notes (Addendum)
Progress Note    [name]  IOX:[id number] DOB: [birth year]  DOA: 02/24/2022 PCP: [facility]      Brief Narrative:    Medical records reviewed and are as summarized below:   [name] is a 61 year old female with history of hypertension, depression, anxiety, restless leg syndrome, GERD, who presents emergency department for chief concerns of shortness of breath via POV.  Initial vitals in the emergency department showed temperature of 99.1, respiration rate of 20, heart rate of 140, blood pressure 124/59, SPO2 of 94% on room air.  CTA PE: Was read as acute PE with moderate thrombus burden involving both lungs.  RV-LV ratio is 1.3 suggesting acute or chronic right heart dysfunction.  Small right pleural effusion.  Patchy infiltrates in the perihilar regions of both lung fields, right more than left.  Moderate to large size fixed hiatal hernia.   As per NP Nelson: 11/14: Admitted by Hospitalist.  Vascular Surgery consulted 11/15:  Thrombectomy performed, massive amounts of clot removed.  Post procedure developed Acute Hemoptysis requiring emergent intubation and transfer to ICU.  PCCM consulted, plan for emergent Bronchoscopy 11/15 3 hour BRONCH to extract clots 11/16  2 units PRBC's to be given.  Repeat Bronch. 11/17: Off pressors. Increasing FiO2 requirements (60% FiO2, 5 peep), CXR with increase in bilateral opacities concerning for PNA.  NO SBT today. Place OG and start feeds. 11/18: Heparin gtt restarted yesterday with no bolus, no hemoptysis reported and Hgb remains stable.  Slow improvement in FiO2 to 60% (from 75%) with diuresis yesterday, renal function remains normal so will diurese again today 11/19: Still with high vent requirements (60% FiO2, 10 PEEP), Heparin gtt d/c due to bmall volume bright red blood from ETT.  [facility] Vascular workup sent.  Holding diuresis due to increased Na+ & BUN.  Solumedrol increased to 40 mg BID 11/20:  Slight improvement in vent requirements (50% FiO2, 8 PEEP).  Worsening Leukocytosis, tracheal aspirate results pending, start Zosyn. 11/21: repeat CT overnight, vomited while in CT. 11/22: no bleeding noted. 11/23: tolerated SBT and sedation holiday - mental status continues to be sluggish 11/24: tolerated SBT and sedation holiday 11/25: tolerated SBT and sedation holiday 11/28: ABNORMAL MRI, [facility] PVS 11/29: Pt able to nod yes/no and blink on commands and respond to questions appropriately by nodding yes/no; still unable to follow commands on BLU/BLL extremities; will consult PT/OT  11/30: Pt unresponsive and not following commands this am.  No acute events overnight  12/3: Pt successfully extubated 12/4: Pt with weak cough and inability to clear secretions requiring prn suctioning, however no signs of respiratory distress        Assessment/Plan:   Principal Problem:   Bilateral pulmonary embolism (HCC) Active Problems:   Multiple sclerosis (HCC)   Chronic constipation   Gastroesophageal reflux disease   RLS (restless legs syndrome)   Tachycardia   Sepsis ([place])   Acute hypoxic respiratory failure ([place])   HAP (hospital-acquired pneumonia)   Pulmonary hemorrhage   Leucocytosis   FUO (fever of unknown origin)   Nutrition Problem: Inadequate oral intake Etiology: inability to eat (pt sedated and ventilated)  Signs/Symptoms: NPO status   Body mass index is 25.93 kg/m.    Acute hypoxic respiratory failure: secondary to b/l PE, hemoptysis & presumed aspiration. S/p bronchoscopy x 2. S/p intubation, ventilation & extubation.  Continue 3 L/min oxygen via [place] and taper off as able.  Continue bronchodilators and incentive spirometry as needed.   Aspiration pneumonia:  Completed antibiotics on 03/13/2022.  Continue bronchodilators as needed.      B/L PE: w/ b/l peroneal LE DVT: w/ recent COVID19 infection approx 3 weeks prior to admission. Echo in 11/23 showed EF 58-30%, normal  diastolic function. S/p thrombectomy 11/15.  Continue Eliquis and metoprolol.  Acute metabolic encephalopathy: MRI brain: no acute infarct but does reveal numerous chronic microhemorrhages which are new from scan a from a few months ago. Continue w/ PT/OT.  Okay for anticoagulation from Dr. Johny Chess, (neurologist) perspective (see note 03/13/2022).   Generalized weakness: PT/OT recs LTAC    Hyperglycemia: continue on accuchecks q4H   Dysphagia: She remains NPO.  Follow-up with speech therapist for further recommendations.  Continue enteral nutrition.   Normocytic anemia: H&H trending down but stable.  No need for blood transfusion at this time.  Continue to monitor.  Persistent leukocytosis, monocytosis: Follow-up with Dr. Tasia Catchings, oncologist, as an outpatient for bone marrow biopsy.   History of multiple sclerosis    Diet Order             Diet NPO time specified  Diet effective now                            Consultants: Neurologist, oncologist, intensivist, vascular surgeon  Procedures: Mechanical thrombectomy to right lower lobe, right middle lobe, right upper lobe pulmonary arteries, left lower lobe, left upper lobe pulmonary arteries 02/25/2022    Medications:    apixaban  5 mg Per Tube BID   arformoterol  15 mcg Nebulization BID   Chlorhexidine Gluconate Cloth  6 each Topical Daily   cyanocobalamin  1,000 mcg Per Tube Daily   feeding supplement (PROSource TF20)  60 mL Per Tube Daily   fiber  1 packet Per Tube TID   free water  100 mL Per Tube Q4H   insulin aspart  0-15 Units Subcutaneous Q4H   metoprolol tartrate  25 mg Per Tube QID   nutrition supplement (JUVEN)  1 packet Per Tube BID BM   mouth rinse  15 mL Mouth Rinse 4 times per day   pantoprazole (PROTONIX) IV  40 mg Intravenous Q24H   revefenacin  175 mcg Nebulization Daily   Continuous Infusions:  sodium chloride Stopped (03/09/22 0212)   feeding supplement (OSMOLITE 1.5 CAL) 55 mL/hr at  03/18/22 0700     Anti-infectives (From admission, onward)    Start     Dose/Rate Route Frequency Ordered Stop   03/11/22 1600  meropenem (MERREM) 1 g in sodium chloride 0.9 % 100 mL IVPB  Status:  Discontinued        1 g 200 mL/hr over 30 Minutes Intravenous Every 8 hours 03/11/22 1519 03/13/22 1554   03/11/22 1530  linezolid (ZYVOX) IVPB 600 mg  Status:  Discontinued        600 mg 300 mL/hr over 60 Minutes Intravenous Every 12 hours 03/11/22 1519 03/13/22 1554   03/08/22 2200  meropenem (MERREM) 1 g in sodium chloride 0.9 % 100 mL IVPB  Status:  Discontinued        1 g 200 mL/hr over 30 Minutes Intravenous Every 8 hours 03/08/22 2050 03/11/22 0854   03/08/22 2200  linezolid (ZYVOX) IVPB 600 mg  Status:  Discontinued        600 mg 300 mL/hr over 60 Minutes Intravenous Every 12 hours 03/08/22 2051 03/11/22 0855   03/02/22 0915  piperacillin-tazobactam (ZOSYN) IVPB 3.375 g  Status:  Discontinued  3.375 g 12.5 mL/hr over 240 Minutes Intravenous Every 8 hours 03/02/22 0828 03/08/22 1837   02/25/22 2030  Ampicillin-Sulbactam (UNASYN) 3 g in sodium chloride 0.9 % 100 mL IVPB  Status:  Discontinued        3 g 200 mL/hr over 30 Minutes Intravenous Every 6 hours 02/25/22 1930 03/01/22 0849   02/25/22 1449  ceFAZolin (ANCEF) 1-4 GM/50ML-% IVPB       Note to Pharmacy: Theadora Rama : cabinet override      02/25/22 1449 02/26/22 0259   02/25/22 1330  ceFAZolin (ANCEF) IVPB 2g/100 mL premix        2 g 200 mL/hr over 30 Minutes Intravenous 30 min pre-op 02/25/22 1330 02/25/22 1729   02/24/22 1730  vancomycin (VANCOCIN) IVPB 1000 mg/200 mL premix        1,000 mg 200 mL/hr over 60 Minutes Intravenous  Once 02/24/22 1719 02/24/22 2209   02/24/22 1730  cefTRIAXone (ROCEPHIN) 2 g in sodium chloride 0.9 % 100 mL IVPB        2 g 200 mL/hr over 30 Minutes Intravenous  Once 02/24/22 1719 02/24/22 2040   02/24/22 1730  doxycycline (VIBRA-TABS) tablet 100 mg        100 mg Oral  Once  02/24/22 1719 02/24/22 1821              Family Communication/Anticipated D/C date and plan/Code Status   DVT prophylaxis: Place TED hose Start: 02/24/22 1725 apixaban (ELIQUIS) tablet 5 mg     Code Status: Full Code  Family Communication: None Disposition Plan: Plan to discharge to LTAC   Status is: Inpatient Remains inpatient appropriate because: Dysphagia, n.p.o.       Subjective:   Interval events noted.  She has no complaints and says she feels better.  Speech is soft and difficult to comprehend  Objective:    Vitals:   03/18/22 0700 03/18/22 0723 03/18/22 0747 03/18/22 0800  BP: 123/65   136/77  Pulse: (!) 122   (!) 125  Resp: (!) 23   (!) 24  Temp:   (!) 97.5 F (36.4 C)   TempSrc:   Oral   SpO2: 94% 99%  96%  Weight:      Height:       No data found.   Intake/Output Summary (Last 24 hours) at 03/18/2022 1014 Last data filed at 03/18/2022 0843 Gross per 24 hour  Intake 2074.08 ml  Output 1100 ml  Net 974.08 ml   Filed Weights   03/16/22 0446 03/17/22 0500 03/18/22 0406  Weight: 65.9 kg 64.2 kg 64.3 kg    Exam:   GEN: NAD SKIN: Warm and dry EYES: No pallor or icterus ENT: MMM, nasogastric tube in place CV: RRR PULM: CTA B ABD: soft, ND, NT, +BS CNS: AAO x 3, quadriplegic EXT: No edema or tenderness   Pressure Injury Foot Anterior;Right;Medial Unstageable - Full thickness tissue loss in which the base of the injury is covered by slough (yellow, tan, gray, green or brown) and/or eschar (tan, brown or black) in the wound bed. brown/black wounds; look li (Active)     Location: Foot  Location Orientation: Anterior;Right;Medial  Staging: Unstageable - Full thickness tissue loss in which the base of the injury is covered by slough (yellow, tan, gray, green or brown) and/or eschar (tan, brown or black) in the wound bed.  Wound Description (Comments): brown/black wounds; look like scabs  Present on Admission: Yes  Dressing Type None  03/18/22 0200  Pressure Injury 02/25/22 Perineum Left;Posterior Deep Tissue Pressure Injury - Purple or maroon localized area of discolored intact skin or blood-filled blister due to damage of underlying soft tissue from pressure and/or shear. purple discoloration on l (Active)  02/25/22 0400  Location: Perineum  Location Orientation: Left;Posterior  Staging: Deep Tissue Pressure Injury - Purple or maroon localized area of discolored intact skin or blood-filled blister due to damage of underlying soft tissue from pressure and/or shear.  Wound Description (Comments): purple discoloration on left buttocks surrounded by redness along butt crack  Present on Admission: Yes  Dressing Type Foam - Lift dressing to assess site every shift 03/18/22 0200     Data Reviewed:   I have personally reviewed following labs and imaging studies:  Labs: Labs show the following:   Basic Metabolic Panel: Recent Labs  Lab 03/12/22 0331 03/13/22 0454 03/14/22 0504 03/17/22 0645 03/18/22 0551  NA 139 138 137 143 144  K 3.8 3.6 4.4 3.9 3.7  CL 102 101 99 105 108  CO2 _0 GLUCOSE 125* 123* 145* 132* 125*  BUN 20 21* 27* 27* 33*  CREATININE 0.36* <0.30* 0.39* 0.34* 0.30*  CALCIUM 9.0 8.7* 8.8* 10.0 10.2  MG 2.2 2.2  --  2.4  --   PHOS 3.0 2.2* 3.2 4.2  --    GFR Estimated Creatinine Clearance: 67.5 mL/min (A) (by C-G formula based on SCr of 0.3 mg/dL (L)). Liver Function Tests: No results for input(s): "AST", "ALT", "ALKPHOS", "BILITOT", "PROT", "ALBUMIN" in the last 168 hours. No results for input(s): "LIPASE", "AMYLASE" in the last 168 hours. No results for input(s): "AMMONIA" in the last 168 hours. Coagulation profile Recent Labs  Lab 03/13/22 1653  INR 1.2    CBC: Recent Labs  Lab 03/12/22 0331 03/13/22 0454 03/14/22 0504 03/15/22 0335 03/16/22 0324 03/17/22 0645 03/18/22 0551  WBC 25.7* 25.7* 26.2* 23.0* 22.0* 17.7* 17.5*  NEUTROABS 15.3* 15.7*  --   --   --   --    --   HGB 8.9* 8.8* 8.2* 7.9* 8.6* 9.1* 8.5*  HCT 29.1* 29.1* 27.5* 26.8* 28.7* 30.4* 28.8*  MCV 93.9 94.5 93.2 95.4 93.5 95.0 97.0  PLT 303 273 268 298 359 363 375   Cardiac Enzymes: Recent Labs  Lab 03/14/22 0504  CKTOTAL 16*   BNP (last 3 results) No results for input(s): "PROBNP" in the last 8760 hours. CBG: Recent Labs  Lab 03/17/22 1531 03/17/22 1954 03/17/22 2355 03/18/22 0341 03/18/22 0746  GLUCAP 126* 147* 112* 130* 126*   D-Dimer: No results for input(s): "DDIMER" in the last 72 hours. Hgb A1c: No results for input(s): "HGBA1C" in the last 72 hours. Lipid Profile: No results for input(s): "CHOL", "HDL", "LDLCALC", "TRIG", "CHOLHDL", "LDLDIRECT" in the last 72 hours. Thyroid function studies: No results for input(s): "TSH", "T4TOTAL", "T3FREE", "THYROIDAB" in the last 72 hours.  Invalid input(s): "FREET3" Anemia work up: No results for input(s): "VITAMINB12", "FOLATE", "FERRITIN", "TIBC", "IRON", "RETICCTPCT" in the last 72 hours. Sepsis Labs: Recent Labs  Lab 03/15/22 0335 03/16/22 0324 03/17/22 0645 03/18/22 0551  WBC 23.0* 22.0* 17.7* 17.5*    Microbiology Recent Results (from the past 240 hour(s))  Respiratory (~20 pathogens) panel by PCR     Status: None   Collection Time: 03/08/22  8:41 PM   Specimen: Tracheal Aspirate; Respiratory  Result Value Ref Range Status   Adenovirus NOT DETECTED NOT DETECTED Final   Coronavirus 229E NOT DETECTED NOT DETECTED Final  Comment: (NOTE) The Coronavirus on the Respiratory Panel, DOES NOT test for the novel  Coronavirus (2019 nCoV)    Coronavirus HKU1 NOT DETECTED NOT DETECTED Final   Coronavirus NL63 NOT DETECTED NOT DETECTED Final   Coronavirus OC43 NOT DETECTED NOT DETECTED Final   Metapneumovirus NOT DETECTED NOT DETECTED Final   Rhinovirus / Enterovirus NOT DETECTED NOT DETECTED Final   Influenza A NOT DETECTED NOT DETECTED Final   Influenza B NOT DETECTED NOT DETECTED Final   Parainfluenza  Virus 1 NOT DETECTED NOT DETECTED Final   Parainfluenza Virus 2 NOT DETECTED NOT DETECTED Final   Parainfluenza Virus 3 NOT DETECTED NOT DETECTED Final   Parainfluenza Virus 4 NOT DETECTED NOT DETECTED Final   Respiratory Syncytial Virus NOT DETECTED NOT DETECTED Final   Bordetella pertussis NOT DETECTED NOT DETECTED Final   Bordetella Parapertussis NOT DETECTED NOT DETECTED Final   Chlamydophila pneumoniae NOT DETECTED NOT DETECTED Final   Mycoplasma pneumoniae NOT DETECTED NOT DETECTED Final    Comment: Performed at [facility], [place] [address] [place], [place] [address]  Culture, Respiratory w Gram Stain     Status: None   Collection Time: 03/08/22  8:42 PM   Specimen: Tracheal Aspirate; Respiratory  Result Value Ref Range Status   Specimen Description   Final    EXPECTORATED SPUTUM Performed at [facility], [address], [place], [place] [address]    Special Requests   Final    NONE Performed at [facility], [address] [place], [place] [address]    Gram Stain   Final    FEW WBC PRESENT,BOTH PMN AND MONONUCLEAR FEW GRAM POSITIVE COCCI IN PAIRS IN CLUSTERS FEW GRAM NEGATIVE RODS FEW GRAM POSITIVE RODS    Culture   Final    FEW Normal respiratory flora-no Staph aureus or Pseudomonas seen Performed at [facility], [address] [place], [place] [address]    Report Status 03/11/2022 FINAL  Final  MRSA Next Gen by PCR, Nasal     Status: None   Collection Time: 03/08/22  9:52 PM   Specimen: Nasal Mucosa; Nasal Swab  Result Value Ref Range Status   MRSA by PCR Next Gen NOT DETECTED NOT DETECTED Final    Comment: (NOTE) The GeneXpert MRSA Assay (FDA approved for NASAL specimens only), is one component of a comprehensive MRSA colonization surveillance program. It is not intended to diagnose MRSA infection nor to guide or monitor treatment for MRSA infections. Test performance is not FDA approved in patients less than 26  years old. Performed at [facility], [address] [place], [place] [address]   Culture, blood (Routine X 2) w Reflex to ID Panel     Status: None   Collection Time: 03/09/22 10:45 AM   Specimen: BLOOD  Result Value Ref Range Status   Specimen Description BLOOD BLOOD LEFT HAND  Final   Special Requests   Final    BOTTLES DRAWN AEROBIC ONLY Blood Culture results may not be optimal due to an inadequate volume of blood received in culture bottles   Culture   Final    NO GROWTH 5 DAYS Performed at [facility], [address], [place], [place] [address]    Report Status 03/14/2022 FINAL  Final  Culture, blood (Routine X 2) w Reflex to ID Panel     Status: None   Collection Time: 03/09/22 10:57 AM   Specimen: BLOOD  Result Value Ref Range Status  Specimen Description BLOOD BLOOD RIGHT HAND  Final   Special Requests   Final    BOTTLES DRAWN AEROBIC AND ANAEROBIC Blood Culture adequate volume   Culture   Final    NO GROWTH 5 DAYS Performed at [facility], [address] [place], [place] [address]    Report Status 03/14/2022 FINAL  Final  Culture, blood (Routine X 2) w Reflex to ID Panel     Status: None   Collection Time: 03/09/22  7:44 PM   Specimen: BLOOD  Result Value Ref Range Status   Specimen Description BLOOD BLOOD RIGHT HAND  Final   Special Requests   Final    BOTTLES DRAWN AEROBIC AND ANAEROBIC Blood Culture adequate volume   Culture   Final    NO GROWTH 5 DAYS Performed at [facility], [address] [place], [place] [address]    Report Status 03/14/2022 FINAL  Final  Culture, blood (Routine X 2) w Reflex to ID Panel     Status: None   Collection Time: 03/09/22  7:47 PM   Specimen: BLOOD  Result Value Ref Range Status   Specimen Description BLOOD BLOOD LEFT HAND  Final   Special Requests   Final    BOTTLES DRAWN AEROBIC AND ANAEROBIC Blood Culture adequate volume   Culture   Final    NO GROWTH 5 DAYS Performed at  [facility], [address], [place], [place] [address]    Report Status 03/14/2022 FINAL  Final    Procedures and diagnostic studies:  No results found.             LOS: 22 days   [employer]olla Copywriter, advertising on [email or web address]. If 7PM-7AM, please contact night-coverage at [email or web address]     03/18/2022, 10:14 AM

## 2022-03-18 NOTE — Progress Notes (Signed)
Speech Language Pathology Treatment: Dysphagia  Patient Details Name: [name] MRN: [id number] DOB: Aug 22, 1960 Today's Date: 03/18/2022 Time: 9937-1696 SLP Time Calculation (min) (ACUTE ONLY): 45 min  Assessment / Plan / Recommendation Clinical Impression  Pt seen for ongoing assessment of dysphagia; appropriateness for oral diet. Per BSE, pt has been recommended to remain strictly NPO d/t degree of pharyngeal phase dysphagia and risk for aspiration/aspiration pneumonia. NGT remains in place for nutrition/hydration needs.  Pt awake, alert but required min-mod verbal/tactile cues intermittently to return her attention to tasks.  On 3L/min O2 via [place]; afebrile, WBC wnl. Noted aphonia though pt mouthed and whispered(x2) during verbal responses. She engaged, whispered name of her stuffed bear, and smiled x2.    Dysphagia tx addressed oral motor exam, oral activities, and therapeutic ice chip trials and drips on sponge swab. Pt continues to present w/ aphonia, congested (min stronger, per NSG) cough, absent volitional swallow, but improved lingual movements of protrusion and lateralization. Noted biting and sucking on sponge swabs -- NSG reported same earlier during care. NSG reported she seemed "interested" in taking something today.   Oral care for hygiene and stimulation of swallowing completed first. Pt often chewed/bit on swab, then sucked on it. Instructed pt to use tongue to "push it around in the mouth", then "push it out" which she did 10/10 reps of exercise. Noted lateral and protrusion movements of tongue as she manipulated the swab orally.  Pt then given 4 ice chip trials; 4 sponge swabs slightly damp w/ water. Overt, clinical s/s of aspiration noted c/b immediate and delayed coughing w/ both ice chips and sucking on damp sponge swab -- strongly suspect pharyngeal phase dysphagia given reduced laryngeal excursion during the swallow; also strongly suspect delayed pharyngeal swallow  initiation. Pt's cough was stronger in production and movement of phlegm per NSG report -- both NSG and SLP felt pt brought phlegm to her mouth during coughing x2-3 occasions.  Noted fair oral control of the swab and ice chips during trials/exs, however, pt exhibited prolonged bolus management w/ chips w/ decreased attention intermittently. This can increase pharyngeal phase deficits also.   Recommend strict NPO with continuation of alternate route of nutrition/hydration/medication currently.  Pt is at increased risk of aspiration/aspiration PNA give dysphonia, weak/wet cough which is concerning for reduced secretion management, overall deconditioned state, prolonged/recent intubation, and medical comorbidities.    SLP to f/u per POC for ongoing clinical assessment of swallowing via dysphagia tx w/ consideration for instrumental swallowing evaluation when appropriate given clincial risk factors. Recommend continued oral care via swabs for hygiene and stimulation of swallowing; aspiration precautions. Pt updated and nodded in agreement but unsure of full understanding. Reinforcement of content may be needed. NSG also made aware of tx results, recommendations, and POC.       HPI HPI: Per H&P "[name] is a 61 year old female with history of hypertension, depression, anxiety, restless leg syndrome, GERD, who presents emergency department for chief concerns of shortness of breath via POV.     Initial vitals in the emergency department showed temperature of 99.1, respiration rate of 20, heart rate of 140, blood pressure 124/59, SPO2 of 94% on room air.     Serum sodium is 138, potassium 2.7, chloride 106, bicarb 23, BUN of 14, serum creatinine 0.62, EGFR greater than 60, nonfasting blood glucose 106, WBC 15.7, hemoglobin 9.3, platelets of 231." Pt intubated 02/25/22-03/15/22.      SLP Plan  Continue with current plan of care  Recommendations for follow up therapy are one component  of a multi-disciplinary discharge planning process, led by the attending physician.  Recommendations may be updated based on patient status, additional functional criteria and insurance authorization.    Recommendations  Diet recommendations: NPO (therapeutic trials during dysphagia tx) Medication Administration: Via alternative means (NGT)                General recommendations:  (Dietician f/u) Oral Care Recommendations: Oral care QID;Staff/trained caregiver to provide oral care Follow Up Recommendations: Skilled nursing-short term rehab (<3 hours/day) Assistance recommended at discharge: Frequent or constant Supervision/Assistance SLP Visit Diagnosis: Dysphagia, oropharyngeal phase (R13.12) Plan: Continue with current plan of care            Orinda Kenner, [place], [place]; [place] [phone] (ascom) Rashidah [place]  03/18/2022, 3:51 PM

## 2022-03-19 ENCOUNTER — Encounter: Payer: Self-pay | Admitting: Internal Medicine

## 2022-03-19 ENCOUNTER — Inpatient Hospital Stay: Payer: Medicare Other

## 2022-03-19 DIAGNOSIS — A419 Sepsis, unspecified organism: Secondary | ICD-10-CM | POA: Diagnosis not present

## 2022-03-19 DIAGNOSIS — R000 Tachycardia, unspecified: Secondary | ICD-10-CM | POA: Diagnosis not present

## 2022-03-19 DIAGNOSIS — I2699 Other pulmonary embolism without acute cor pulmonale: Secondary | ICD-10-CM | POA: Diagnosis not present

## 2022-03-19 LAB — CBC WITH DIFFERENTIAL/PLATELET
Abs Immature Granulocytes: 2.28 10*3/uL — ABNORMAL HIGH (ref 0.00–0.07)
Basophils Absolute: 0.1 10*3/uL (ref 0.0–0.1)
Basophils Relative: 0 %
Eosinophils Absolute: 0 10*3/uL (ref 0.0–0.5)
Eosinophils Relative: 0 %
HCT: 33.1 % — ABNORMAL LOW (ref 36.0–46.0)
Hemoglobin: 9.7 g/dL — ABNORMAL LOW (ref 12.0–15.0)
Immature Granulocytes: 9 %
Lymphocytes Relative: 4 %
Lymphs Abs: 1 10*3/uL (ref 0.7–4.0)
MCH: 28 pg (ref 26.0–34.0)
MCHC: 29.3 g/dL — ABNORMAL LOW (ref 30.0–36.0)
MCV: 95.7 fL (ref 80.0–100.0)
Monocytes Absolute: 3.1 10*3/uL — ABNORMAL HIGH (ref 0.1–1.0)
Monocytes Relative: 12 %
Neutro Abs: 19.8 10*3/uL — ABNORMAL HIGH (ref 1.7–7.7)
Neutrophils Relative %: 75 %
Platelets: 392 10*3/uL (ref 150–400)
RBC: 3.46 MIL/uL — ABNORMAL LOW (ref 3.87–5.11)
RDW: 19.9 % — ABNORMAL HIGH (ref 11.5–15.5)
WBC: 26.3 10*3/uL — ABNORMAL HIGH (ref 4.0–10.5)
nRBC: 0 % (ref 0.0–0.2)

## 2022-03-19 LAB — COMPREHENSIVE METABOLIC PANEL
ALT: 68 U/L — ABNORMAL HIGH (ref 0–44)
AST: 29 U/L (ref 15–41)
Albumin: 3.4 g/dL — ABNORMAL LOW (ref 3.5–5.0)
Alkaline Phosphatase: 171 U/L — ABNORMAL HIGH (ref 38–126)
Anion gap: 7 (ref 5–15)
BUN: 31 mg/dL — ABNORMAL HIGH (ref 6–20)
CO2: 28 mmol/L (ref 22–32)
Calcium: 10.1 mg/dL (ref 8.9–10.3)
Chloride: 107 mmol/L (ref 98–111)
Creatinine, Ser: 0.44 mg/dL (ref 0.44–1.00)
GFR, Estimated: >60 mL/min (ref >60)
Glucose, Bld: 152 mg/dL — ABNORMAL HIGH (ref 70–99)
Potassium: 3.8 mmol/L (ref 3.5–5.1)
Sodium: 142 mmol/L (ref 135–145)
Total Bilirubin: 0.5 mg/dL (ref 0.3–1.2)
Total Protein: 7.2 g/dL (ref 6.5–8.1)

## 2022-03-19 LAB — URINALYSIS, ROUTINE W REFLEX MICROSCOPIC
Bilirubin Urine: NEGATIVE
Glucose, UA: NEGATIVE mg/dL
Hgb urine dipstick: NEGATIVE
Ketones, ur: NEGATIVE mg/dL
Leukocytes,Ua: NEGATIVE
Nitrite: NEGATIVE
Protein, ur: NEGATIVE mg/dL
Specific Gravity, Urine: 1.025 (ref 1.005–1.030)
pH: 7 (ref 5.0–8.0)

## 2022-03-19 LAB — GLUCOSE, CAPILLARY
Glucose-Capillary: 101 mg/dL — ABNORMAL HIGH (ref 70–99)
Glucose-Capillary: 111 mg/dL — ABNORMAL HIGH (ref 70–99)
Glucose-Capillary: 112 mg/dL — ABNORMAL HIGH (ref 70–99)
Glucose-Capillary: 113 mg/dL — ABNORMAL HIGH (ref 70–99)
Glucose-Capillary: 123 mg/dL — ABNORMAL HIGH (ref 70–99)
Glucose-Capillary: 143 mg/dL — ABNORMAL HIGH (ref 70–99)
Glucose-Capillary: 93 mg/dL (ref 70–99)
Glucose-Capillary: 99 mg/dL (ref 70–99)

## 2022-03-19 LAB — LACTIC ACID, PLASMA: Lactic Acid, Venous: 1.7 mmol/L (ref 0.5–1.9)

## 2022-03-19 LAB — PROCALCITONIN: Procalcitonin: 0.16 ng/mL

## 2022-03-19 LAB — MRSA NEXT GEN BY PCR, NASAL: MRSA by PCR Next Gen: NOT DETECTED

## 2022-03-19 MED ORDER — SODIUM CHLORIDE 0.9 % IV SOLN: INTRAVENOUS | Status: AC

## 2022-03-19 MED ORDER — SODIUM CHLORIDE 0.9 % IV BOLUS
1000.0000 mL | Freq: Once | INTRAVENOUS | Status: AC
  Administered 2022-03-19: 1000 mL via INTRAVENOUS

## 2022-03-19 MED ORDER — SODIUM CHLORIDE 0.9 % IV SOLN
2.0000 g | Freq: Three times a day (TID) | INTRAVENOUS | Status: DC
  Administered 2022-03-19 – 2022-03-20 (×4): 2 g via INTRAVENOUS
  Filled 2022-03-19 (×5): qty 12.5

## 2022-03-19 MED ORDER — IOHEXOL 9 MG/ML PO SOLN
500.0000 mL | Freq: Once | ORAL | Status: DC | PRN
  Administered 2022-03-19: 500 mL via ORAL

## 2022-03-19 MED ORDER — IOHEXOL 300 MG/ML  SOLN
100.0000 mL | Freq: Once | INTRAMUSCULAR | Status: AC | PRN
  Administered 2022-03-19: 100 mL via INTRAVENOUS

## 2022-03-19 MED ORDER — STERILE WATER FOR INJECTION IJ SOLN
INTRAMUSCULAR | Status: AC
  Administered 2022-03-19: 10 mL
  Filled 2022-03-19: qty 10

## 2022-03-19 MED ORDER — VANCOMYCIN HCL IN DEXTROSE 1-5 GM/200ML-% IV SOLN
1000.0000 mg | INTRAVENOUS | Status: DC
  Administered 2022-03-20: 1000 mg via INTRAVENOUS
  Filled 2022-03-19: qty 200

## 2022-03-19 MED ORDER — IOHEXOL 9 MG/ML PO SOLN: 500.0000 mL | Freq: Once | ORAL | Status: DC | PRN

## 2022-03-19 MED ORDER — IBUPROFEN 400 MG PO TABS
400.0000 mg | ORAL_TABLET | Freq: Once | ORAL | Status: AC
  Administered 2022-03-19: 400 mg via NASOGASTRIC
  Filled 2022-03-19: qty 1

## 2022-03-19 MED ORDER — VANCOMYCIN HCL 1250 MG/250ML IV SOLN
1250.0000 mg | Freq: Once | INTRAVENOUS | Status: AC
  Administered 2022-03-19: 1250 mg via INTRAVENOUS
  Filled 2022-03-19: qty 250

## 2022-03-19 NOTE — Progress Notes (Signed)
SLP Cancellation Note  Patient Details Name: [name] MRN: [id number] DOB: 16-Mar-1961   Cancelled treatment:       Reason Eval/Treat Not Completed: Patient not medically ready (Clinical swallowing re-evaluation deferred at this time as pt lethargic, febrile, and tachycardic. Red MEWS. Will continue efforts as appropriate.)  Clyde Canterbury, M.S., CCC-SLP Speech-Language Pathologist [facility] [phone] (ASCOM)   Woodroe Chen 03/19/2022, 1:52 PM

## 2022-03-19 NOTE — TOC Progression Note (Signed)
Transition of Care [facility]) - Progression Note    Patient Details  Name: [name] MRN: [id number] Date of Birth: 01/23/1961  Transition of Care [facility]) CM/SW Contact  Allayne Butcher, RN Phone Number: 03/19/2022, 2:05 PM  Clinical Narrative:    Received a call from [facility] with [facility] this morning.  The discharge they were scheduled to have was not going as planned so they would not have a bed today.  She reports they would definitely have a bed tomorrow.  Also received a message from the MD this morning that he felt the patient may be getting septic and he did not feel comfortable transferring her today. TOC will follow up tomorrow with [facility].    Expected Discharge Plan: Long Term Acute Care (LTAC) Barriers to Discharge: Continued Medical Work up  Expected Discharge Plan and Services Expected Discharge Plan: Long Term Acute Care (LTAC)   Discharge Planning Services: CM Consult Post Acute Care Choice: Long Term Acute Care (LTAC) Living arrangements for the past 2 months: Single Family Home                 DME Arranged: N/A DME Agency: NA       HH Arranged: NA HH Agency: NA         Social Determinants of Health (SDOH) Interventions    Readmission Risk Interventions     No data to display

## 2022-03-19 NOTE — Progress Notes (Signed)
Pt transferred from ICU at approx 1530.  See VS, HR continues to be elevated.  MD made aware or MEWS score.  Pt needs CT abd.  Remaining contrast given via NGT and additional line placed by Lelon Mast, RN.  Charge RN spoke with MD, plan to transfer back to ICU.  Patient transferred via bed with belongings, updated report given.  SO at the bedside

## 2022-03-19 NOTE — Progress Notes (Signed)
Pharmacy Antibiotic Note  [name] is a 61 y.o. female w/ PMH of RLS, MS, HTN, depression, tachycardia admitted on 02/24/2022 with PE, DVT, Cerebral microhemorrhages now with fever and worsening leucocytosis.  Pharmacy has been consulted for vancomycin and cefepime dosing.  Plan:  1) start cefepime 2 grams IV every 8 hours  2) start vancomycin 1250 mg IV x 1 then 1000mg  Q 24 hrs Goal AUC 400-550. Expected AUC: 416.1 SCr used: 0.80 mg/dL (rounded up) Ke h-1, T1/2 12.7h MRSA PCR ordered for potential de--escalation Daily serum creatinine while on IV vancomycin  Height: 5\' 2"  (157.5 cm) Weight: 61.2 kg (134 lb 14.7 oz) IBW/kg (Calculated) : 50.1  Temp (24hrs), Avg:100 F (37.8 C), Min:99 F (37.2 C), Max:102 F (38.9 C)  Recent Labs  Lab 03/13/22 0454 03/14/22 0504 03/15/22 0335 03/16/22 0324 03/17/22 0645 03/18/22 0551 03/19/22 0350  WBC 25.7* 26.2* 23.0* 22.0* 17.7* 17.5* 26.3*  CREATININE <0.30* 0.39*  --   --  0.34* 0.30*  --     Estimated Creatinine Clearance: 65.9 mL/min (A) (by C-G formula based on SCr of 0.3 mg/dL (L)).    Allergies  Allergen Reactions   Erythromycin Hives and Nausea And Vomiting   Lexapro [Escitalopram Oxalate] Hives    Antimicrobials this admission: Unasyn 11/15 >> 11/19 Zosyn 11/20 >> 11/26 Linezolid 11/26 >> 12/1 Meropenem 11/26 >> 12/1 Vancomycin 12/07 >>  cefepime 12/07 >>   Cultures this admission 11/14 Bcx: NG 11/15 MRSA PCR: (-) 11/19 Tracheal aspirate: NG 11/21 Tracheal aspirate: NG 11/21 Bcx: NG 11/22 SARS-CoV-2: (+) 11/26 C diff: (-) 11/26 MRSA PCR: (-) 11/26 RVP: (-) 11/26: Tracheal aspirate: NF 11/27 Bcx: NG  Thank you for allowing pharmacy to be a part of this patient's care.  12/26 03/19/2022 9:26 AM

## 2022-03-19 NOTE — Progress Notes (Signed)
Progress Note    [name]  WIO:[id number] DOB: 1960-10-03  DOA: 02/24/2022 PCP: [place]      Brief Narrative:    Medical records reviewed and are as summarized below:   [name] is a 61 year old female with history of hypertension, depression, anxiety, restless leg syndrome, GERD, who presents emergency department for chief concerns of shortness of breath via POV.  Initial vitals in the emergency department showed temperature of 99.1, respiration rate of 20, heart rate of 140, blood pressure 124/59, SPO2 of 94% on room air.  CTA PE: Was read as acute PE with moderate thrombus burden involving both lungs.  RV-LV ratio is 1.3 suggesting acute or chronic right heart dysfunction.  Small right pleural effusion.  Patchy infiltrates in the perihilar regions of both lung fields, right more than left.  Moderate to large size fixed hiatal hernia.   As per NP Nelson: 11/14: Admitted by Hospitalist.  Vascular Surgery consulted 11/15:  Thrombectomy performed, massive amounts of clot removed.  Post procedure developed Acute Hemoptysis requiring emergent intubation and transfer to ICU.  PCCM consulted, plan for emergent Bronchoscopy 11/15 3 hour BRONCH to extract clots 11/16  2 units PRBC's to be given.  Repeat Bronch. 11/17: Off pressors. Increasing FiO2 requirements (60% FiO2, 5 peep), CXR with increase in bilateral opacities concerning for PNA.  NO SBT today. Place OG and start feeds. 11/18: Heparin gtt restarted yesterday with no bolus, no hemoptysis reported and Hgb remains stable.  Slow improvement in FiO2 to 60% (from 75%) with diuresis yesterday, renal function remains normal so will diurese again today 11/19: Still with high vent requirements (60% FiO2, 10 PEEP), Heparin gtt d/c due to bmall volume bright red blood from ETT.  Collagen Vascular workup sent.  Holding diuresis due to increased Na+ & BUN.  Solumedrol increased to 40 mg BID 11/20:  Slight improvement in vent requirements (50% FiO2, 8 PEEP).  Worsening Leukocytosis, tracheal aspirate results pending, start Zosyn. 11/21: repeat CT overnight, vomited while in CT. 11/22: no bleeding noted. 11/23: tolerated SBT and sedation holiday - mental status continues to be sluggish 11/24: tolerated SBT and sedation holiday 11/25: tolerated SBT and sedation holiday 11/28: ABNORMAL MRI, REMIANS PVS 11/29: Pt able to nod yes/no and blink on commands and respond to questions appropriately by nodding yes/no; still unable to follow commands on BLU/BLL extremities; will consult PT/OT  11/30: Pt unresponsive and not following commands this am.  No acute events overnight  12/3: Pt successfully extubated 12/4: Pt with weak cough and inability to clear secretions requiring prn suctioning, however no signs of respiratory distress        Assessment/Plan:   Principal Problem:   Bilateral pulmonary embolism (HCC) Active Problems:   Multiple sclerosis (HCC)   Chronic constipation   Gastroesophageal reflux disease   RLS (restless legs syndrome)   Tachycardia   Sepsis ([place])   Acute hypoxic respiratory failure ([place])   HAP (hospital-acquired pneumonia)   Pulmonary hemorrhage   Leucocytosis   FUO (fever of unknown origin)   Nutrition Problem: Inadequate oral intake Etiology: inability to eat (pt sedated and ventilated)  Signs/Symptoms: NPO status   Body mass index is 24.68 kg/m.  Fever, sinus tachycardia, worsening leukocytosis, suspected sepsis: Urinalysis, chest x-ray, blood culture, lactic acid, procalcitonin, CT chest abdomen pelvis were ordered.  Lactic acid and urinalysis were unremarkable.  Chest x-ray showed mild improvement in bilateral airspace opacities.  No new findings noted on chest x-ray.  CT chest abdomen pelvis is pending.  She has been started on empiric IV vancomycin and cefepime for suspected sepsis.  She was also given IV fluids for hydration.  Acute hypoxic  respiratory failure: secondary to b/l PE, hemoptysis & presumed aspiration. S/p bronchoscopy x 2. S/p intubation, ventilation & extubation. Continue bronchodilators and incentive spirometry as needed.  Continue 3 L/min oxygen via nasal cannula and wean off oxygen as able.   Aspiration pneumonia: Completed antibiotics on 03/13/2022.  Continue bronchodilators as needed.      Bilateral pulmonary embolism, bilateral lower extremity DVT: w/ recent COVID19 infection approx 3 weeks prior to admission. Echo in 11/23 showed EF 72-09%, normal diastolic function. S/p thrombectomy 11/15.  Continue Eliquis and metoprolol.  Acute metabolic encephalopathy: MRI brain: no acute infarct but does reveal numerous chronic microhemorrhages which are new from scan a from a few months ago. Continue w/ PT/OT.  Okay for anticoagulation from Dr. Johny Chess, (neurologist) perspective (see note 03/13/2022).   Generalized weakness: PT/OT recs LTAC    Hyperglycemia: Monitor glucose every 6 hours.  Check hemoglobin A1c.   Dysphagia: She remains NPO.  Follow-up with speech therapist for further recommendations.  Continue enteral nutrition.   Normocytic anemia: H&H trending down but stable.  No need for blood transfusion at this time.  Continue to monitor.  Persistent leukocytosis, neutrophilia, monocytosis: Follow-up with Dr. Tasia Catchings, oncologist, as an outpatient for bone marrow biopsy.   History of multiple sclerosis    Diet Order             Diet NPO time specified  Diet effective now                            Consultants: Neurologist, oncologist, intensivist, vascular surgeon  Procedures: Mechanical thrombectomy to right lower lobe, right middle lobe, right upper lobe pulmonary arteries, left lower lobe, left upper lobe pulmonary arteries 02/25/2022    Medications:    apixaban  5 mg Per Tube BID   arformoterol  15 mcg Nebulization BID   Chlorhexidine Gluconate Cloth  6 each Topical Daily    cyanocobalamin  1,000 mcg Per Tube Daily   feeding supplement (PROSource TF20)  60 mL Per Tube Daily   fiber  1 packet Per Tube TID   free water  100 mL Per Tube Q4H   insulin aspart  0-15 Units Subcutaneous Q4H   metoprolol tartrate  25 mg Per Tube QID   nutrition supplement (JUVEN)  1 packet Per Tube BID BM   mouth rinse  15 mL Mouth Rinse 4 times per day   pantoprazole (PROTONIX) IV  40 mg Intravenous Q24H   revefenacin  175 mcg Nebulization Daily   Continuous Infusions:  sodium chloride Stopped (03/09/22 0212)   feeding supplement (OSMOLITE 1.5 CAL) 1,000 mL (03/19/22 0258)     Anti-infectives (From admission, onward)    Start     Dose/Rate Route Frequency Ordered Stop   03/11/22 1600  meropenem (MERREM) 1 g in sodium chloride 0.9 % 100 mL IVPB  Status:  Discontinued        1 g 200 mL/hr over 30 Minutes Intravenous Every 8 hours 03/11/22 1519 03/13/22 1554   03/11/22 1530  linezolid (ZYVOX) IVPB 600 mg  Status:  Discontinued        600 mg 300 mL/hr over 60 Minutes Intravenous Every 12 hours 03/11/22 1519 03/13/22 1554   03/08/22 2200  meropenem (MERREM) 1 g in sodium chloride  0.9 % 100 mL IVPB  Status:  Discontinued        1 g 200 mL/hr over 30 Minutes Intravenous Every 8 hours 03/08/22 2050 03/11/22 0854   03/08/22 2200  linezolid (ZYVOX) IVPB 600 mg  Status:  Discontinued        600 mg 300 mL/hr over 60 Minutes Intravenous Every 12 hours 03/08/22 2051 03/11/22 0855   03/02/22 0915  piperacillin-tazobactam (ZOSYN) IVPB 3.375 g  Status:  Discontinued        3.375 g 12.5 mL/hr over 240 Minutes Intravenous Every 8 hours 03/02/22 0828 03/08/22 1837   02/25/22 2030  Ampicillin-Sulbactam (UNASYN) 3 g in sodium chloride 0.9 % 100 mL IVPB  Status:  Discontinued        3 g 200 mL/hr over 30 Minutes Intravenous Every 6 hours 02/25/22 1930 03/01/22 0849   02/25/22 1449  ceFAZolin (ANCEF) 1-4 GM/50ML-% IVPB       Note to Pharmacy: Theadora Rama : cabinet override      02/25/22  1449 02/26/22 0259   02/25/22 1330  ceFAZolin (ANCEF) IVPB 2g/100 mL premix        2 g 200 mL/hr over 30 Minutes Intravenous 30 min pre-op 02/25/22 1330 02/25/22 1729   02/24/22 1730  vancomycin (VANCOCIN) IVPB 1000 mg/200 mL premix        1,000 mg 200 mL/hr over 60 Minutes Intravenous  Once 02/24/22 1719 02/24/22 2209   02/24/22 1730  cefTRIAXone (ROCEPHIN) 2 g in sodium chloride 0.9 % 100 mL IVPB        2 g 200 mL/hr over 30 Minutes Intravenous  Once 02/24/22 1719 02/24/22 2040   02/24/22 1730  doxycycline (VIBRA-TABS) tablet 100 mg        100 mg Oral  Once 02/24/22 1719 02/24/22 1821              Family Communication/Anticipated D/C date and plan/Code Status   DVT prophylaxis: Place TED hose Start: 02/24/22 1725 apixaban (ELIQUIS) tablet 5 mg     Code Status: Full Code  Family Communication: Shanon Brow, boyfriend,at the bedside Disposition Plan: Plan to discharge to LTAC   Status is: Inpatient Remains inpatient appropriate because: Dysphagia, n.p.o.       Subjective:   Interval events noted.  Patient had fever this morning with Tmax of 102 F.  McKenzie, RN, was at the bedside.  Patient's boyfriend, Shanon Brow, was also at the bedside.  She appears confused and is unable to provide an adequate history.  She denies any symptoms such as cough, chest pain, abdominal pain or any urinary symptoms.  Objective:    Vitals:   03/19/22 0401 03/19/22 0500 03/19/22 0559 03/19/22 0800  BP: 129/86 115/82  122/77  Pulse: (!) 144 (!) 127 (!) 138 (!) 138  Resp: (!) 29 (!) 27 (!) 28 (!) 27  Temp:   (!) 102 F (38.9 C) 100.3 F (37.9 C)  TempSrc:   Axillary Axillary  SpO2: 100% 94% 99% 92%  Weight: 61.2 kg     Height:       No data found.   Intake/Output Summary (Last 24 hours) at 03/19/2022 0903 Last data filed at 03/19/2022 0558 Gross per 24 hour  Intake 1557.5 ml  Output 1400 ml  Net 157.5 ml   Filed Weights   03/17/22 0500 03/18/22 0406 03/19/22 0401  Weight: 64.2 kg  64.3 kg 61.2 kg    Exam:  GEN: NAD SKIN: Warm and dry EYES: No pallor or icterus ENT:  MMM, NG tube in place CV: RRR, tachycardic PULM: CTA B ABD: soft, ND, NT, +BS CNS: Alert but confused, quadriplegic EXT: No edema or tenderness      Pressure Injury Foot Anterior;Right;Medial Unstageable - Full thickness tissue loss in which the base of the injury is covered by slough (yellow, tan, gray, green or brown) and/or eschar (tan, brown or black) in the wound bed. brown/black wounds; look li (Active)     Location: Foot  Location Orientation: Anterior;Right;Medial  Staging: Unstageable - Full thickness tissue loss in which the base of the injury is covered by slough (yellow, tan, gray, green or brown) and/or eschar (tan, brown or black) in the wound bed.  Wound Description (Comments): brown/black wounds; look like scabs  Present on Admission: Yes  Dressing Type Foam - Lift dressing to assess site every shift 03/18/22 2000     Pressure Injury 02/25/22 Perineum Left;Posterior Deep Tissue Pressure Injury - Purple or maroon localized area of discolored intact skin or blood-filled blister due to damage of underlying soft tissue from pressure and/or shear. purple discoloration on l (Active)  02/25/22 0400  Location: Perineum  Location Orientation: Left;Posterior  Staging: Deep Tissue Pressure Injury - Purple or maroon localized area of discolored intact skin or blood-filled blister due to damage of underlying soft tissue from pressure and/or shear.  Wound Description (Comments): purple discoloration on left buttocks surrounded by redness along butt crack  Present on Admission: Yes  Dressing Type Foam - Lift dressing to assess site every shift 03/18/22 0200     Data Reviewed:   I have personally reviewed following labs and imaging studies:  Labs: Labs show the following:   Basic Metabolic Panel: Recent Labs  Lab 03/13/22 0454 03/14/22 0504 03/17/22 0645 03/18/22 0551  NA 138 137  143 144  K 3.6 4.4 3.9 3.7  CL 101 99 105 108  CO2 _0 GLUCOSE 123* 145* 132* 125*  BUN 21* 27* 27* 33*  CREATININE <0.30* 0.39* 0.34* 0.30*  CALCIUM 8.7* 8.8* 10.0 10.2  MG 2.2  --  2.4  --   PHOS 2.2* 3.2 4.2  --    GFR Estimated Creatinine Clearance: 65.9 mL/min (A) (by C-G formula based on SCr of 0.3 mg/dL (L)). Liver Function Tests: No results for input(s): "AST", "ALT", "ALKPHOS", "BILITOT", "PROT", "ALBUMIN" in the last 168 hours. No results for input(s): "LIPASE", "AMYLASE" in the last 168 hours. No results for input(s): "AMMONIA" in the last 168 hours. Coagulation profile Recent Labs  Lab 03/13/22 1653  INR 1.2    CBC: Recent Labs  Lab 03/13/22 0454 03/14/22 0504 03/15/22 0335 03/16/22 0324 03/17/22 0645 03/18/22 0551 03/19/22 0350  WBC 25.7*   < > 23.0* 22.0* 17.7* 17.5* 26.3*  NEUTROABS 15.7*  --   --   --   --   --  19.8*  HGB 8.8*   < > 7.9* 8.6* 9.1* 8.5* 9.7*  HCT 29.1*   < > 26.8* 28.7* 30.4* 28.8* 33.1*  MCV 94.5   < > 95.4 93.5 95.0 97.0 95.7  PLT 273   < > 298 359 363 375 392   < > = values in this interval not displayed.   Cardiac Enzymes: Recent Labs  Lab 03/14/22 0504  CKTOTAL 16*   BNP (last 3 results) No results for input(s): "PROBNP" in the last 8760 hours. CBG: Recent Labs  Lab 03/18/22 1550 03/18/22 1953 03/19/22 0011 03/19/22 0349 03/19/22 0805  GLUCAP 136* 139* 101* 123*  111*   D-Dimer: No results for input(s): "DDIMER" in the last 72 hours. Hgb A1c: No results for input(s): "HGBA1C" in the last 72 hours. Lipid Profile: No results for input(s): "CHOL", "HDL", "LDLCALC", "TRIG", "CHOLHDL", "LDLDIRECT" in the last 72 hours. Thyroid function studies: No results for input(s): "TSH", "T4TOTAL", "T3FREE", "THYROIDAB" in the last 72 hours.  Invalid input(s): "FREET3" Anemia work up: No results for input(s): "VITAMINB12", "FOLATE", "FERRITIN", "TIBC", "IRON", "RETICCTPCT" in the last 72 hours. Sepsis Labs: Recent  Labs  Lab 03/16/22 0324 03/17/22 0645 03/18/22 0551 03/19/22 0350  WBC 22.0* 17.7* 17.5* 26.3*    Microbiology Recent Results (from the past 240 hour(s))  Culture, blood (Routine X 2) w Reflex to ID Panel     Status: None   Collection Time: 03/09/22 10:45 AM   Specimen: BLOOD  Result Value Ref Range Status   Specimen Description BLOOD BLOOD LEFT HAND  Final   Special Requests   Final    BOTTLES DRAWN AEROBIC ONLY Blood Culture results may not be optimal due to an inadequate volume of blood received in culture bottles   Culture   Final    NO GROWTH 5 DAYS Performed at [facility], [address] [place], [place] [address]    Report Status 03/14/2022 FINAL  Final  Culture, blood (Routine X 2) w Reflex to ID Panel     Status: None   Collection Time: 03/09/22 10:57 AM   Specimen: BLOOD  Result Value Ref Range Status   Specimen Description BLOOD BLOOD RIGHT HAND  Final   Special Requests   Final    BOTTLES DRAWN AEROBIC AND ANAEROBIC Blood Culture adequate volume   Culture   Final    NO GROWTH 5 DAYS Performed at [facility], [address], [place], [place] [address]    Report Status 03/14/2022 FINAL  Final  Culture, blood (Routine X 2) w Reflex to ID Panel     Status: None   Collection Time: 03/09/22  7:44 PM   Specimen: BLOOD  Result Value Ref Range Status   Specimen Description BLOOD BLOOD RIGHT HAND  Final   Special Requests   Final    BOTTLES DRAWN AEROBIC AND ANAEROBIC Blood Culture adequate volume   Culture   Final    NO GROWTH 5 DAYS Performed at [facility], [address] [place], [place] [address]    Report Status 03/14/2022 FINAL  Final  Culture, blood (Routine X 2) w Reflex to ID Panel     Status: None   Collection Time: 03/09/22  7:47 PM   Specimen: BLOOD  Result Value Ref Range Status   Specimen Description BLOOD BLOOD LEFT HAND  Final   Special Requests   Final    BOTTLES DRAWN AEROBIC AND ANAEROBIC Blood Culture  adequate volume   Culture   Final    NO GROWTH 5 DAYS Performed at [facility], [address], [place],  [address]    Report Status 03/14/2022 FINAL  Final    Procedures and diagnostic studies:  No results found.             LOS: 23 days   [place] Copywriter, advertising on [email or web address]. If 7PM-7AM, please contact night-coverage at [email or web address]     03/19/2022, 9:03 AM

## 2022-03-20 LAB — BASIC METABOLIC PANEL
Anion gap: 6 (ref 5–15)
BUN: 19 mg/dL (ref 6–20)
CO2: 26 mmol/L (ref 22–32)
Calcium: 9 mg/dL (ref 8.9–10.3)
Chloride: 106 mmol/L (ref 98–111)
Creatinine, Ser: 0.32 mg/dL — ABNORMAL LOW (ref 0.44–1.00)
GFR, Estimated: >60 mL/min (ref >60)
Glucose, Bld: 107 mg/dL — ABNORMAL HIGH (ref 70–99)
Potassium: 3.3 mmol/L — ABNORMAL LOW (ref 3.5–5.1)
Sodium: 138 mmol/L (ref 135–145)

## 2022-03-20 LAB — GLUCOSE, CAPILLARY
Glucose-Capillary: 95 mg/dL (ref 70–99)
Glucose-Capillary: 120 mg/dL — ABNORMAL HIGH (ref 70–99)
Glucose-Capillary: 117 mg/dL — ABNORMAL HIGH (ref 70–99)
Glucose-Capillary: 100 mg/dL — ABNORMAL HIGH (ref 70–99)

## 2022-03-20 LAB — CBC WITH DIFFERENTIAL/PLATELET
Abs Immature Granulocytes: 1.47 10*3/uL — ABNORMAL HIGH (ref 0.00–0.07)
Basophils Absolute: 0.1 10*3/uL (ref 0.0–0.1)
Basophils Relative: 0 %
Eosinophils Absolute: 0 10*3/uL (ref 0.0–0.5)
Eosinophils Relative: 0 %
HCT: 29.8 % — ABNORMAL LOW (ref 36.0–46.0)
Hemoglobin: 8.9 g/dL — ABNORMAL LOW (ref 12.0–15.0)
Immature Granulocytes: 7 %
Lymphocytes Relative: 5 %
Lymphs Abs: 1 10*3/uL (ref 0.7–4.0)
MCH: 28.4 pg (ref 26.0–34.0)
MCHC: 29.9 g/dL — ABNORMAL LOW (ref 30.0–36.0)
MCV: 95.2 fL (ref 80.0–100.0)
Monocytes Absolute: 2.1 10*3/uL — ABNORMAL HIGH (ref 0.1–1.0)
Monocytes Relative: 10 %
Neutro Abs: 16.1 10*3/uL — ABNORMAL HIGH (ref 1.7–7.7)
Neutrophils Relative %: 78 %
Platelets: 311 10*3/uL (ref 150–400)
RBC: 3.13 MIL/uL — ABNORMAL LOW (ref 3.87–5.11)
RDW: 19.1 % — ABNORMAL HIGH (ref 11.5–15.5)
Smear Review: ADEQUATE
WBC: 20.9 10*3/uL — ABNORMAL HIGH (ref 4.0–10.5)
nRBC: 0 % (ref 0.0–0.2)

## 2022-03-20 LAB — PHOSPHORUS: Phosphorus: 3 mg/dL (ref 2.5–4.6)

## 2022-03-20 LAB — HEMOGLOBIN A1C
Hgb A1c MFr Bld: 6 % — ABNORMAL HIGH (ref 4.8–5.6)
Mean Plasma Glucose: 126 mg/dL

## 2022-03-20 LAB — LACTIC ACID, PLASMA: Lactic Acid, Venous: 1.4 mmol/L (ref 0.5–1.9)

## 2022-03-20 LAB — PROCALCITONIN: Procalcitonin: 0.15 ng/mL

## 2022-03-20 LAB — MAGNESIUM: Magnesium: 2 mg/dL (ref 1.7–2.4)

## 2022-03-20 MED ORDER — PROSOURCE TF20 ENFIT COMPATIBL EN LIQD: 60.0000 mL | Freq: Every day | ENTERAL | Status: DC

## 2022-03-20 MED ORDER — JUVEN PO PACK: 1.0000 | PACK | Freq: Two times a day (BID) | ORAL | 0 refills | Status: DC

## 2022-03-20 MED ORDER — APIXABAN 5 MG PO TABS: 5.0000 mg | ORAL_TABLET | Freq: Two times a day (BID) | ORAL | Status: DC

## 2022-03-20 MED ORDER — OSMOLITE 1.5 CAL PO LIQD: 1000.0000 mL | ORAL | 0 refills | Status: DC

## 2022-03-20 MED ORDER — POTASSIUM CHLORIDE 20 MEQ PO PACK
40.0000 meq | PACK | Freq: Two times a day (BID) | ORAL | Status: DC
  Administered 2022-03-20: 40 meq via ORAL
  Filled 2022-03-20: qty 2

## 2022-03-20 MED ORDER — METOPROLOL TARTRATE 25 MG PO TABS
25.0000 mg | ORAL_TABLET | Freq: Four times a day (QID) | ORAL | Status: DC
  Administered 2022-03-20: 25 mg
  Filled 2022-03-20: qty 1

## 2022-03-20 MED ORDER — VANCOMYCIN HCL 1250 MG/250ML IV SOLN: 1250.0000 mg | INTRAVENOUS | Status: DC

## 2022-03-20 MED ORDER — SODIUM CHLORIDE 0.9 % IV SOLN: 2.0000 g | Freq: Three times a day (TID) | INTRAVENOUS | Status: AC

## 2022-03-20 MED ORDER — CYANOCOBALAMIN 1000 MCG PO TABS: 1000.0000 ug | ORAL_TABLET | Freq: Every day | ORAL | Status: DC

## 2022-03-20 MED ORDER — METOPROLOL TARTRATE 25 MG PO TABS: 25.0000 mg | ORAL_TABLET | Freq: Four times a day (QID) | ORAL | Status: DC

## 2022-03-20 MED ORDER — FREE WATER: 100.0000 mL | Status: DC

## 2022-03-20 NOTE — Progress Notes (Signed)
Nutrition Follow-up  DOCUMENTATION CODES:   Not applicable  INTERVENTION:   -Continue TF via NGT:    Osmolite 1.5 @ 55 ml/hr    60 ml Prosource TF daily   100 ml free water flush every 4 hours     Tube feeding regimen provides 2060 kcal (100% of needs), 103 grams of protein, and 1003 ml of H2O. Total free water: 1603 ml daily     -1 packet Juven BID via tube, each packet provides 95 calories, 2.5 grams of protein (collagen), and 9.8 grams of carbohydrate (3 grams sugar); also contains 7 grams of L-arginine and L-glutamine, 300 mg vitamin C, 15 mg vitamin E, 1.2 mcg vitamin B-12, 9.5 mg zinc, 200 mg calcium, and 1.5 g  Calcium Beta-hydroxy-Beta-methylbutyrate to support wound healing   NUTRITION DIAGNOSIS:   Inadequate oral intake related to inability to eat (pt sedated and ventilated) as evidenced by NPO status.  Ongoing  GOAL:   Patient will meet greater than or equal to 90% of their needs  Met with TF  MONITOR:   Diet advancement, TF tolerance  REASON FOR ASSESSMENT:   Ventilator    ASSESSMENT:   61 y/o female with h/o MS, GERD and RLS who is admitted with bilateral PEs s/p thrombectomy 11/15. Pt also with new PNA.  12/3- extubated, NGT (tip of tube below diaphragm in stomach, side port just past GE function)   Reviewed I/O's: +2 L x 24 hours and +6.5 L since 03/06/22  UOP: 300 ml x 24 hours   Per SLP notes, plan to maintain NPO status with alternative means of feeding and hydration secondary to high aspiration risk. Pt remains on TF and tolerating well.   Medications reviewed and include vitamin B-12, potassium chloride, and 0.9% sodium chloride infusion @ 100 ml/hr.  Per TOC notes, plan to transfer to [facility] once bed is available and pt stable for transfer.   Labs reviewed: K: 3.3 (on supplementation), CBGS: 117-120 (inpatient orders for glycemic control are 0-15 units insulin aspart every 4 hours).    Diet Order:   Diet Order             Diet NPO time  specified  Diet effective now                   EDUCATION NEEDS:   No education needs have been identified at this time  Skin:  Skin Assessment: Skin Integrity Issues: Skin Integrity Issues:: DTI, Unstageable DTI: perineum Unstageable: rt foot  Last BM:  03/20/22 (type 7)  Height:   Ht Readings from Last 1 Encounters:  02/26/22 _0  (1.575 m)    Weight:   Wt Readings from Last 1 Encounters:  03/20/22 63.8 kg    Ideal Body Weight:  50 kg  BMI:  Body mass index is 25.73 kg/m.  Estimated Nutritional Needs:   Kcal:  1700-2000kcal/day  Protein:  85-100g/day  Fluid:  1.5-1.8L/day    Loistine Chance, RD, LDN, [place] Registered Dietitian II Certified Diabetes Care and Education Specialist Please refer to AMION for RD and/or RD on-call/weekend/after hours pager

## 2022-03-20 NOTE — [place]2 (Signed)
[place] MEDICAID FL2 LEVEL OF CARE FORM     IDENTIFICATION  Patient Name: [name] Birthdate: 19-Oct-1960 Sex: female Admission Date (Current Location): 02/24/2022  [place] and [place] Number:  Chiropodist and Address:  [facility], [address], [place], [place] [address]      Provider Number: [id number]  Attending Physician Name and Address:  Lurene Shadow, MD  Relative Name and Phone Number:  Windell Moulding sister, [phone]    Current Level of Care: Hospital Recommended Level of Care: Other (Comment) ([facility]) Prior Approval Number:    Date Approved/Denied:   PASRR Number: [id number]  Discharge Plan: Other (Comment) ([facility])    Current Diagnoses: Patient Active Problem List   Diagnosis Date Noted   Leucocytosis 03/13/2022   FUO (fever of unknown origin) 03/13/2022   Sepsis (HCC) 03/02/2022   Acute hypoxic respiratory failure (HCC) 03/02/2022   HAP (hospital-acquired pneumonia) 03/02/2022   Pulmonary hemorrhage 03/02/2022   Bilateral pulmonary embolism (HCC) 02/24/2022   Tachycardia 02/24/2022   Multiple sclerosis (HCC) 06/26/2021   Abdominal pain, LUQ (left upper quadrant) 06/10/2021   Chronic constipation 06/10/2021   Family history of pancreatic cancer 06/10/2021   Gastroesophageal reflux disease 06/10/2021   Low back pain 12/06/2020   Numbness 09/26/2020   Falls frequently 04/18/2020   Right leg pain 04/18/2020   RLS (restless legs syndrome) 04/18/2020   History of multiple sclerosis (HCC) 02/22/2020    Orientation RESPIRATION BLADDER Height & Weight     Self, Situation, Place  Normal Incontinent, External catheter Weight: 140 lb 10.5 oz (63.8 kg) Height:  5\' 2"  (157.5 cm)  BEHAVIORAL SYMPTOMS/MOOD NEUROLOGICAL BOWEL NUTRITION STATUS      Incontinent Feeding tube  AMBULATORY STATUS COMMUNICATION OF NEEDS Skin   Total Care Verbally Other (Comment) (pale, dry, redness)                        Personal Care Assistance Level of Assistance  Bathing, Feeding, Dressing, Total care Bathing Assistance: Maximum assistance Feeding assistance: Maximum assistance Dressing Assistance: Maximum assistance Total Care Assistance: Maximum assistance   Functional Limitations Info             SPECIAL CARE FACTORS FREQUENCY  PT (By licensed PT), OT (By licensed OT), Speech therapy                    Contractures      Additional Factors Info  Allergies   Allergies Info: Erythromycin, Lexapro (Escitalopram Oxalate)           Current Medications (03/20/2022):  This is the current hospital active medication list Current Facility-Administered Medications  Medication Dose Route Frequency Provider Last Rate Last Admin   0.9 %  sodium chloride infusion  250 mL Intravenous Continuous 14/11/2021, MD   Stopped at 03/09/22 0212   0.9 %  sodium chloride infusion   Intravenous Continuous 03/11/22, MD 100 mL/hr at 03/20/22 1000 Infusion Verify at 03/20/22 1000   acetaminophen (TYLENOL) tablet 650 mg  650 mg Per Tube Q6H PRN 14/08/23, MD   650 mg at 03/19/22 1135   apixaban (ELIQUIS) tablet 5 mg  5 mg Per Tube BID 14/07/23, MD   5 mg at 03/20/22 0935   arformoterol (BROVANA) nebulizer solution 15 mcg  15 mcg Nebulization BID 14/08/23, MD   15 mcg at 03/20/22 0804   ceFEPIme (MAXIPIME) 2 g in sodium chloride 0.9 % 100 mL IVPB  2 g Intravenous Q8H Dallie Piles, [facility]   Stopped at 03/20/22 [id number]   Chlorhexidine Gluconate Cloth 2 % PADS 6 each  6 each Topical Daily Jennye Boroughs, MD   6 each at 03/20/22 0400   cyanocobalamin (VITAMIN B12) tablet 1,000 mcg  1,000 mcg Per Tube Daily Jennye Boroughs, MD   1,000 mcg at 03/20/22 0935   feeding supplement (OSMOLITE 1.5 CAL) liquid 1,000 mL  1,000 mL Per Tube Continuous Jennye Boroughs, MD 55 mL/hr at 03/20/22 1000 Infusion Verify at 03/20/22 1000   feeding supplement (PROSource TF20) liquid 60 mL  60 mL Per Tube Daily  Jennye Boroughs, MD   60 mL at 03/20/22 [id number]   fiber (NUTRISOURCE FIBER) 1 packet  1 packet Per Tube TID Jennye Boroughs, MD   1 packet at 03/20/22 0933   free water 100 mL  100 mL Per Tube Q4H Jennye Boroughs, MD   100 mL at 03/20/22 0800   insulin aspart (novoLOG) injection 0-15 Units  0-15 Units Subcutaneous Q4H Jennye Boroughs, MD   2 Units at 03/19/22 1251   iohexol (OMNIPAQUE) 9 MG/ML oral solution 500 mL  500 mL Per Tube Once PRN Jennye Boroughs, MD       metoprolol tartrate (LOPRESSOR) injection 5 mg  5 mg Intravenous Q8H PRN Jennye Boroughs, MD   5 mg at 03/19/22 1135   metoprolol tartrate (LOPRESSOR) tablet 25 mg  25 mg Per Tube QID Jennye Boroughs, MD   25 mg at 03/20/22 [id number]   nutrition supplement (JUVEN) (JUVEN) powder packet 1 packet  1 packet Per Tube BID BM Jennye Boroughs, MD   1 packet at 03/20/22 0935   Oral care mouth rinse  15 mL Mouth Rinse 4 times per day Jennye Boroughs, MD   15 mL at 03/19/22 2143   Oral care mouth rinse  15 mL Mouth Rinse PRN Jennye Boroughs, MD       pantoprazole (PROTONIX) injection 40 mg  40 mg Intravenous Q24H Jennye Boroughs, MD   40 mg at 03/20/22 0929   potassium chloride (KLOR-CON) packet 40 mEq  40 mEq Oral BID Jennye Boroughs, MD   40 mEq at 03/20/22 0936   revefenacin (YUPELRI) nebulizer solution 175 mcg  175 mcg Nebulization Daily Jennye Boroughs, MD   175 mcg at 03/20/22 0804   vancomycin (VANCOCIN) IVPB 1000 mg/200 mL premix  1,000 mg Intravenous Q24H Dallie Piles, [place] at 03/20/22 [id number]     Discharge Medications: Please see discharge summary for a list of discharge medications.  Relevant Imaging Results:  Relevant Lab Results:   Additional Information ss#: [id number]  Judeen Hammans Selma Mink, LCSW

## 2022-03-20 NOTE — Progress Notes (Addendum)
Progress Note    [name]  FAO:[id number] DOB: [birth year]  DOA: 02/24/2022 PCP: [place]      Brief Narrative:    Medical records reviewed and are as summarized below:   [name] is a 61 year old female with history of hypertension, depression, anxiety, restless leg syndrome, GERD, who presents emergency department for chief concerns of shortness of breath via POV.  Initial vitals in the emergency department showed temperature of 99.1, respiration rate of 20, heart rate of 140, blood pressure 124/59, SPO2 of 94% on room air.  CTA PE: Was read as acute PE with moderate thrombus burden involving both lungs.  RV-LV ratio is 1.3 suggesting acute or chronic right heart dysfunction.  Small right pleural effusion.  Patchy infiltrates in the perihilar regions of both lung fields, right more than left.  Moderate to large size fixed hiatal hernia.   As per NP Nelson: 11/14: Admitted by Hospitalist.  Vascular Surgery consulted 11/15:  Thrombectomy performed, massive amounts of clot removed.  Post procedure developed Acute Hemoptysis requiring emergent intubation and transfer to ICU.  PCCM consulted, plan for emergent Bronchoscopy 11/15 3 hour BRONCH to extract clots 11/16  2 units PRBC's to be given.  Repeat Bronch. 11/17: Off pressors. Increasing FiO2 requirements (60% FiO2, 5 peep), CXR with increase in bilateral opacities concerning for PNA.  NO SBT today. Place OG and start feeds. 11/18: Heparin gtt restarted yesterday with no bolus, no hemoptysis reported and Hgb remains stable.  Slow improvement in FiO2 to 60% (from 75%) with diuresis yesterday, renal function remains normal so will diurese again today 11/19: Still with high vent requirements (60% FiO2, 10 PEEP), Heparin gtt d/c due to bmall volume bright red blood from ETT.  [facility] V[facility] workup sent.  Holding diuresis due to increased Na+ & BUN.  Solumedrol increased to 40 mg BID 11/20:  Slight improvement in vent requirements (50% FiO2, 8 PEEP).  Worsening Leukocytosis, tracheal aspirate results pending, start Zosyn. 11/21: repeat CT overnight, vomited while in CT. 11/22: no bleeding noted. 11/23: tolerated SBT and sedation holiday - mental status continues to be sluggish 11/24: tolerated SBT and sedation holiday 11/25: tolerated SBT and sedation holiday 11/28: ABNORMAL MRI, REMIANS PVS 11/29: Pt able to nod yes/no and blink on commands and respond to questions appropriately by nodding yes/no; still unable to follow commands on BLU/BLL extremities; will consult PT/OT  11/30: Pt unresponsive and not following commands this am.  No acute events overnight  12/3: Pt successfully extubated 12/4: Pt with weak cough and inability to clear secretions requiring prn suctioning, however no signs of respiratory distress        Assessment/Plan:   Principal Problem:   Bilateral pulmonary embolism (HCC) Active Problems:   Multiple sclerosis (HCC)   Chronic constipation   Gastroesophageal reflux disease   RLS (restless legs syndrome)   Tachycardia   Sepsis ([place])   Acute hypoxic respiratory failure ([place])   Bilateral pneumonia   Pulmonary hemorrhage   Leucocytosis   FUO (fever of unknown origin)   Nutrition Problem: Inadequate oral intake Etiology: inability to eat (pt sedated and ventilated)  Signs/Symptoms: NPO status   Body mass index is 25.73 kg/m.  Fever, sinus tachycardia, leukocytosis, sepsis secondary to new bilateral pneumonia: CT chest abdomen pelvis obtained on 03/19/2022 showed  Multifocal bilateral upper and lower lobe pneumonia.  MRSA PCR was negative.  Discontinue IV vancomycin but continue IV cefepime.  Plan to treat for minimum of 5 days.  Discontinue IV fluids.  Lactic acid and procalcitonin levels were normal.  Urinalysis was unremarkable. TSH was normal.  Persistent sinus tachycardia may be due to acute illness.  Acute hypoxic respiratory failure: She  was weaned off of oxygen today and she is tolerating room air. secondary to b/l PE, hemoptysis & presumed aspiration. S/p bronchoscopy x 2. S/p intubation, ventilation & extubation. Continue bronchodilators and incentive spirometry as needed.   Recent aspiration pneumonia: Initially completed antibiotics on 03/13/2022.     Bilateral pulmonary embolism, bilateral lower extremity DVT: w/ recent COVID19 infection approx 3 weeks prior to admission. Echo in 11/23 showed EF 85-02%, normal diastolic function. S/p thrombectomy 11/15.  Continue Eliquis and metoprolol.  Acute metabolic encephalopathy: MRI brain: no acute infarct but does reveal numerous chronic microhemorrhages which are new from scan a from a few months ago. Continue w/ PT/OT.  Okay for anticoagulation from Dr. Johny Chess, (neurologist) perspective (see note 03/13/2022).   Generalized weakness: Patient is very deconditioned.  Plan to transfer to long-term acute care hospital for treatment and rehabilitation.   Hyperglycemia, prediabetes: Hemoglobin A1c 6.0.  Monitor glucose level and treat accordingly.   Dysphagia: She remains NPO.  Follow-up with speech therapist for further recommendations.  Continue enteral nutrition.   Normocytic anemia: H&H trending down but stable.  No need for blood transfusion at this time.  Continue to monitor.  Persistent leukocytosis, neutrophilia, monocytosis: Follow-up with Dr. Tasia Catchings, oncologist, as an outpatient for bone marrow biopsy.   History of multiple sclerosis, wheelchair-bound at baseline   Plan to transfer to [facility] today.  Case discussed with Raquel Sarna at [phone].  She says she is familiar with the patient and will be able to manage on the patient at their facility.  The only problem today is getting transportation.  They are still working on it.    Diet Order             Diet NPO time specified  Diet effective now                             Consultants: Neurologist, oncologist, intensivist, vascular surgeon  Procedures: Mechanical thrombectomy to right lower lobe, right middle lobe, right upper lobe pulmonary arteries, left lower lobe, left upper lobe pulmonary arteries 02/25/2022    Medications:    apixaban  5 mg Per Tube BID   arformoterol  15 mcg Nebulization BID   Chlorhexidine Gluconate Cloth  6 each Topical Daily   cyanocobalamin  1,000 mcg Per Tube Daily   feeding supplement (PROSource TF20)  60 mL Per Tube Daily   fiber  1 packet Per Tube TID   free water  100 mL Per Tube Q4H   insulin aspart  0-15 Units Subcutaneous Q4H   metoprolol tartrate  25 mg Per Tube Q6H   nutrition supplement (JUVEN)  1 packet Per Tube BID BM   mouth rinse  15 mL Mouth Rinse 4 times per day   pantoprazole (PROTONIX) IV  40 mg Intravenous Q24H   potassium chloride  40 mEq Oral BID   revefenacin  175 mcg Nebulization Daily   Continuous Infusions:  sodium chloride Stopped (03/09/22 0212)   ceFEPime (MAXIPIME) IV 2 g (03/20/22 1522)   feeding supplement (OSMOLITE 1.5 CAL) 55 mL/hr at 03/20/22 1400     Anti-infectives (From admission, onward)    Start     Dose/Rate Route Frequency Ordered Stop   03/21/22 0600  vancomycin (VANCOREADY)  IVPB 1250 mg/250 mL  Status:  Discontinued        1,250 mg 166.7 mL/hr over 90 Minutes Intravenous Every 24 hours 03/20/22 1424 03/20/22 1448   03/20/22 0600  vancomycin (VANCOCIN) IVPB 1000 mg/200 mL premix  Status:  Discontinued        1,000 mg 200 mL/hr over 60 Minutes Intravenous Every 24 hours 03/19/22 1139 03/20/22 1424   03/19/22 1200  vancomycin (VANCOREADY) IVPB 1250 mg/250 mL        1,250 mg 166.7 mL/hr over 90 Minutes Intravenous  Once 03/19/22 0930 03/19/22 1413   03/19/22 1100  ceFEPIme (MAXIPIME) 2 g in sodium chloride 0.9 % 100 mL IVPB        2 g 200 mL/hr over 30 Minutes Intravenous Every 8 hours 03/19/22 0930     03/11/22 1600  meropenem (MERREM) 1 g in  sodium chloride 0.9 % 100 mL IVPB  Status:  Discontinued        1 g 200 mL/hr over 30 Minutes Intravenous Every 8 hours 03/11/22 1519 03/13/22 1554   03/11/22 1530  linezolid (ZYVOX) IVPB 600 mg  Status:  Discontinued        600 mg 300 mL/hr over 60 Minutes Intravenous Every 12 hours 03/11/22 1519 03/13/22 1554   03/08/22 2200  meropenem (MERREM) 1 g in sodium chloride 0.9 % 100 mL IVPB  Status:  Discontinued        1 g 200 mL/hr over 30 Minutes Intravenous Every 8 hours 03/08/22 2050 03/11/22 0854   03/08/22 2200  linezolid (ZYVOX) IVPB 600 mg  Status:  Discontinued        600 mg 300 mL/hr over 60 Minutes Intravenous Every 12 hours 03/08/22 2051 03/11/22 0855   03/02/22 0915  piperacillin-tazobactam (ZOSYN) IVPB 3.375 g  Status:  Discontinued        3.375 g 12.5 mL/hr over 240 Minutes Intravenous Every 8 hours 03/02/22 0828 03/08/22 1837   02/25/22 2030  Ampicillin-Sulbactam (UNASYN) 3 g in sodium chloride 0.9 % 100 mL IVPB  Status:  Discontinued        3 g 200 mL/hr over 30 Minutes Intravenous Every 6 hours 02/25/22 1930 03/01/22 0849   02/25/22 1449  ceFAZolin (ANCEF) 1-4 GM/50ML-% IVPB       Note to Pharmacy: Theadora Rama : cabinet override      02/25/22 1449 02/26/22 0259   02/25/22 1330  ceFAZolin (ANCEF) IVPB 2g/100 mL premix        2 g 200 mL/hr over 30 Minutes Intravenous 30 min pre-op 02/25/22 1330 02/25/22 1729   02/24/22 1730  vancomycin (VANCOCIN) IVPB 1000 mg/200 mL premix        1,000 mg 200 mL/hr over 60 Minutes Intravenous  Once 02/24/22 1719 02/24/22 2209   02/24/22 1730  cefTRIAXone (ROCEPHIN) 2 g in sodium chloride 0.9 % 100 mL IVPB        2 g 200 mL/hr over 30 Minutes Intravenous  Once 02/24/22 1719 02/24/22 2040   02/24/22 1730  doxycycline (VIBRA-TABS) tablet 100 mg        100 mg Oral  Once 02/24/22 1719 02/24/22 1821              Family Communication/Anticipated D/C date and plan/Code Status   DVT prophylaxis: Place TED hose Start: 02/24/22  1725 apixaban (ELIQUIS) tablet 5 mg     Code Status: Full Code  Family Communication: Plan discussed with Daniel at the bedside.  Plan was also discussed with  Rod Holler (sister) over the phone Disposition Plan: Plan to discharge to LTAC   Status is: Inpatient Remains inpatient appropriate because: Dysphagia, n.p.o.       Subjective:   Interval events noted.  No fever overnight.  She remains tachycardic but heart rate appears slightly better than yesterday.  She said she is okay.   She was coughing intermittently during this encounter.  Objective:    Vitals:   03/20/22 1600 03/20/22 1615 03/20/22 1630 03/20/22 1645  BP: (!) 146/89 (!) 158/69 136/71 (!) 144/77  Pulse: (!) 132 (!) 137 (!) 130 (!) 132  Resp: (!) 29 (!) 25 (!) 23 (!) 27  Temp:      TempSrc:      SpO2: 97% 95% 97% 99%  Weight:      Height:       No data found.   Intake/Output Summary (Last 24 hours) at 03/20/2022 1652 Last data filed at 03/20/2022 1400 Gross per 24 hour  Intake 2846.3 ml  Output 1450 ml  Net 1396.3 ml   Filed Weights   03/18/22 0406 03/19/22 0401 03/20/22 0500  Weight: 64.3 kg 61.2 kg 63.8 kg    Exam:  GEN: NAD SKIN: Warm and dry EYES: No pallor or icterus ENT: MMM, NG tube in place CV: RRR PULM: Bilateral coarse breath sounds ABD: soft, ND, NT, +BS CNS: AAO x 1 (person), quadriplegic EXT: No edema or tenderness         Data Reviewed:   I have personally reviewed following labs and imaging studies:  Labs: Labs show the following:   Basic Metabolic Panel: Recent Labs  Lab 03/14/22 0504 03/17/22 0645 03/18/22 0551 03/19/22 0943 03/20/22 0438 03/20/22 0445  NA 137 143 144 142  --  138  K 4.4 3.9 3.7 3.8  --  3.3*  CL 99 105 108 107  --  106  CO2 _0 --  26  GLUCOSE 145* 132* 125* 152*  --  107*  BUN 27* 27* 33* 31*  --  19  CREATININE 0.39* 0.34* 0.30* 0.44  --  0.32*  CALCIUM 8.8* 10.0 10.2 10.1  --  9.0  MG  --  2.4  --   --  2.0  --   PHOS 3.2  4.2  --   --  3.0  --    GFR Estimated Creatinine Clearance: 67.3 mL/min (A) (by C-G formula based on SCr of 0.32 mg/dL (L)). Liver Function Tests: Recent Labs  Lab 03/19/22 0943  AST 29  ALT 68*  ALKPHOS 171*  BILITOT 0.5  PROT 7.2  ALBUMIN 3.4*   No results for input(s): "LIPASE", "AMYLASE" in the last 168 hours. No results for input(s): "AMMONIA" in the last 168 hours. Coagulation profile Recent Labs  Lab 03/13/22 1653  INR 1.2    CBC: Recent Labs  Lab 03/16/22 0324 03/17/22 0645 03/18/22 0551 03/19/22 0350 03/20/22 0445  WBC 22.0* 17.7* 17.5* 26.3* 20.9*  NEUTROABS  --   --   --  19.8* 16.1*  HGB 8.6* 9.1* 8.5* 9.7* 8.9*  HCT 28.7* 30.4* 28.8* 33.1* 29.8*  MCV 93.5 95.0 97.0 95.7 95.2  PLT 359 363 375 392 311   Cardiac Enzymes: Recent Labs  Lab 03/14/22 0504  CKTOTAL 16*   BNP (last 3 results) No results for input(s): "PROBNP" in the last 8760 hours. CBG: Recent Labs  Lab 03/19/22 1925 03/19/22 2321 03/20/22 0349 03/20/22 0729 03/20/22 1227  GLUCAP 113* 112* 95 120* 117*  D-Dimer: No results for input(s): "DDIMER" in the last 72 hours. Hgb A1c: Recent Labs    03/19/22 0051  HGBA1C 6.0*   Lipid Profile: No results for input(s): "CHOL", "HDL", "LDLCALC", "TRIG", "CHOLHDL", "LDLDIRECT" in the last 72 hours. Thyroid function studies: No results for input(s): "TSH", "T4TOTAL", "T3FREE", "THYROIDAB" in the last 72 hours.  Invalid input(s): "FREET3" Anemia work up: No results for input(s): "VITAMINB12", "FOLATE", "FERRITIN", "TIBC", "IRON", "RETICCTPCT" in the last 72 hours. Sepsis Labs: Recent Labs  Lab 03/17/22 0645 03/18/22 0551 03/19/22 0350 03/19/22 0943 03/20/22 0445  PROCALCITON  --   --   --  0.16 0.15  WBC 17.7* 17.5* 26.3*  --  20.9*  LATICACIDVEN  --   --   --  1.7 1.4    Microbiology Recent Results (from the past 240 hour(s))  Culture, blood (Routine X 2) w Reflex to ID Panel     Status: None (Preliminary result)    Collection Time: 03/19/22  9:41 AM   Specimen: BLOOD RIGHT HAND  Result Value Ref Range Status   Specimen Description BLOOD RIGHT HAND  Final   Special Requests   Final    BOTTLES DRAWN AEROBIC AND ANAEROBIC Blood Culture adequate volume   Culture   Final    NO GROWTH < 24 HOURS Performed at [facility], [address] [place], [place] [address]    Report Status PENDING  Incomplete  Culture, blood (Routine X 2) w Reflex to ID Panel     Status: None (Preliminary result)   Collection Time: 03/19/22 10:35 AM   Specimen: BLOOD RIGHT HAND  Result Value Ref Range Status   Specimen Description BLOOD RIGHT HAND  Final   Special Requests   Final    BOTTLES DRAWN AEROBIC AND ANAEROBIC Blood Culture adequate volume   Culture   Final    NO GROWTH < 24 HOURS Performed at [facility], [address] [place], [place] [address]    Report Status PENDING  Incomplete  MRSA Next Gen by PCR, Nasal     Status: None   Collection Time: 03/19/22 12:45 PM  Result Value Ref Range Status   MRSA by PCR Next Gen NOT DETECTED NOT DETECTED Final    Comment: (NOTE) The GeneXpert MRSA Assay (FDA approved for NASAL specimens only), is one component of a comprehensive MRSA colonization surveillance program. It is not intended to diagnose MRSA infection nor to guide or monitor treatment for MRSA infections. Test performance is not FDA approved in patients less than 31 years old. Performed at [facility], [address] [place], [place] [address]     Procedures and diagnostic studies:  CT CHEST ABDOMEN PELVIS W CONTRAST  Result Date: 03/19/2022 CLINICAL DATA:  Sepsis.  Shortness of breath. EXAM: CT CHEST, ABDOMEN, AND PELVIS WITH CONTRAST TECHNIQUE: Multidetector CT imaging of the chest, abdomen and pelvis was performed following the standard protocol during bolus administration of intravenous contrast. RADIATION DOSE REDUCTION: This exam was performed according to the  departmental dose-optimization program which includes automated exposure control, adjustment of the mA and/or kV according to patient size and/or use of iterative reconstruction technique. CONTRAST:  12m OMNIPAQUE IOHEXOL 300 MG/ML  SOLN COMPARISON:  03/03/2022 FINDINGS: CT CHEST FINDINGS Cardiovascular: Heart size is normal. No pericardial effusion. Central pulmonary arteries appear patent. Previously noted bilateral lower lobe pulmonary emboli have resolved in the interval. Mediastinum/Nodes: 1.6 cm nodule is identified within the left lobe of thyroid gland, image 8/2. Recommend thyroid [place](  ref: J Am Coll Radiol. 2015 Feb;12(2): 143-50). The trachea appears patent. Nasogastric tube is identified within the esophagus. No enlarged axillary, mediastinal, or hilar lymph nodes. Lungs/Pleura: No pleural fluid. Multifocal bilateral upper and lower lobe interstitial and airspace opacities are identified. This is most severe within the posterior left lower lobe and anterior right lower lobe. Musculoskeletal: No chest wall mass or suspicious bone lesions identified. CT ABDOMEN PELVIS FINDINGS Hepatobiliary: No focal liver abnormality is seen. No gallstones, gallbladder wall thickening, or biliary dilatation. Pancreas: Unremarkable. No pancreatic ductal dilatation or surrounding inflammatory changes. Spleen: Normal in size without focal abnormality. Adrenals/Urinary Tract: Adrenal glands are unremarkable. Kidneys are normal, without renal calculi, focal lesion, or hydronephrosis. New heterogeneous, hyperdense debris within the posterolateral aspect of the bladder measuring 2.5 x 1.2 cm, image 110/2. Not seen on recent CT from 03/03/2022. Stomach/Bowel: Nasogastric tube is in the body of the stomach. Small hiatal hernia. The appendix is visualized and appears normal. No bowel wall thickening, inflammation, or distension. Vascular/Lymphatic: Normal appearance of the abdominal aorta. No abdominopelvic adenopathy identified.  Reproductive: Uterus and bilateral adnexa are unremarkable. Other: No abdominal wall hernia or abnormality. No abdominopelvic ascites. Musculoskeletal: No acute or significant osseous findings. IMPRESSION: 1. Multifocal bilateral upper and lower lobe interstitial and airspace opacities are identified. This is most severe within the posterior left lower lobe and anterior right lower lobe. Imaging findings are favored to represent multifocal pneumonia. 2. No acute abnormality noted within the abdomen or pelvis. 3. New heterogeneous, hyperdense material within the posterolateral aspect of the bladder measuring 2.5 x 1.2 cm. Not seen on recent CT from 03/03/2022. This is nonspecific, but favored to represent a small amount of hemorrhagic or post infectious/inflammatory debris. 4. 1.6 cm nodule within the left lobe of thyroid gland. Recommend thyroid US (ref: J Am Coll Radiol. 2015 Feb;12(2): 143-50). Electronically Signed   By: Kerby Moors M.D.   On: 03/19/2022 18:37   DG Chest Port 1 View  Result Date: 03/19/2022 CLINICAL DATA:  Fever. EXAM: PORTABLE CHEST 1 VIEW COMPARISON:  Prior chest radiographs, most recently done 03/14/2022. CT 03/03/2022. FINDINGS: 0943 hours. Interval extubation. Enteric tube projects below the diaphragm. The heart size and mediastinal contours are stable. Previously demonstrated multifocal airspace opacities in both lungs show interval mild improvement. The right upper lobe density seen on the most recent study has not progressed. No pneumothorax or significant pleural effusion is identified. The bones appear unchanged. IMPRESSION: Interval extubation with further mild improvement in bilateral airspace opacities. No new findings. Electronically Signed   By: Richardean Sale M.D.   On: 03/19/2022 10:21               LOS: 24 days   Juwuan Sedita  [facility]   Pager on [email or web address]. If 7PM-7AM, please contact night-coverage at [email or web address]     03/20/2022, 4:52  PM

## 2022-03-20 NOTE — Progress Notes (Signed)
Patient is A&Ox3 - easily reoriented. See full assessment for more details. Patient was transferred from hospital bed to [facility]. Sister-in-law, Windell Moulding, and Ruth's husband are at the bedside. EMTALA completed. Patient was transferred out (to [facility]) with all belongings and paper chart. Report was attempted at 8:10P.M. Spoke with supervisor at [facility] Joni Reining) who was given my name and number and requested to call me back.

## 2022-03-20 NOTE — Progress Notes (Signed)
Report given to Waynard Reeds RN at [facility] at [employer].M..

## 2022-03-20 NOTE — Discharge Summary (Signed)
Physician Discharge Summary   Patient: [name] MRN: [id number] DOB: 19-Jan-1961  Admit date:     02/24/2022  Discharge date: 03/20/22  Discharge Physician: Jennye Boroughs   PCP: [place]   Recommendations at discharge:   Follow-up with physician hospitalist at [facility] within 24 hours.  Discharge Diagnoses: Principal Problem:   Bilateral pulmonary embolism (HCC) Active Problems:   Multiple sclerosis (HCC)   Chronic constipation   Gastroesophageal reflux disease   RLS (restless legs syndrome)   Tachycardia   Sepsis (HCC)   Acute hypoxic respiratory failure (HCC)   Bilateral pneumonia   Pulmonary hemorrhage   Leucocytosis   FUO (fever of unknown origin)  Resolved Problems:   * No resolved hospital problems. [facility] Course:  Ms. Amai Cappiello is a 61 year old female with history of hypertension, depression, anxiety, restless leg syndrome, GERD, who presented to the emergency room with shortness of breath.   Initial vitals in the emergency department showed temperature of 99.1, respiration rate of 20, heart rate of 140, blood pressure 124/59, SPO2 of 94% on room air.   CTA PE: Was read as acute PE with moderate thrombus burden involving both lungs.  RV-LV ratio is 1.3 suggesting acute or chronic right heart dysfunction.  Small right pleural effusion.  Patchy infiltrates in the perihilar regions of both lung fields, right more than left.  Moderate to large size fixed hiatal hernia.     As per NP Nelson: 11/14: Admitted by Hospitalist.  Vascular Surgery consulted 11/15:  Thrombectomy performed, massive amounts of clot removed.  Post procedure developed Acute Hemoptysis requiring emergent intubation and transfer to ICU.  PCCM consulted, plan for emergent Bronchoscopy 11/15 3 hour BRONCH to extract clots 11/16  2 units PRBC's to be given.  Repeat Bronch. 11/17: Off pressors. Increasing FiO2 requirements (60% FiO2, 5 peep), CXR with  increase in bilateral opacities concerning for PNA.  NO SBT today. Place OG and start feeds. 11/18: Heparin gtt restarted yesterday with no bolus, no hemoptysis reported and Hgb remains stable.  Slow improvement in FiO2 to 60% (from 75%) with diuresis yesterday, renal function remains normal so will diurese again today 11/19: Still with high vent requirements (60% FiO2, 10 PEEP), Heparin gtt d/c due to bmall volume bright red blood from ETT.  Collagen Vascular workup sent.  Holding diuresis due to increased Na+ & BUN.  Solumedrol increased to 40 mg BID 11/20: Slight improvement in vent requirements (50% FiO2, 8 PEEP).  Worsening Leukocytosis, tracheal aspirate results pending, start Zosyn. 11/21: repeat CT overnight, vomited while in CT. 11/22: no bleeding noted. 11/23: tolerated SBT and sedation holiday - mental status continues to be sluggish 11/24: tolerated SBT and sedation holiday 11/25: tolerated SBT and sedation holiday 11/28: ABNORMAL MRI, REMIANS PVS 11/29: Pt able to nod yes/no and blink on commands and respond to questions appropriately by nodding yes/no; still unable to follow commands on BLU/BLL extremities; will consult PT/OT  11/30: Pt unresponsive and not following commands this am.  No acute events overnight  12/3: Pt successfully extubated 12/4: Pt with weak cough and inability to clear secretions requiring prn suctioning, however no signs of respiratory distress      Assessment and Plan:   Fever, sinus tachycardia, leukocytosis, sepsis secondary to new bilateral pneumonia: CT chest abdomen pelvis obtained on 03/19/2022 showed  Multifocal bilateral upper and lower lobe pneumonia.  MRSA PCR was negative.  Discontinue IV vancomycin but continue IV cefepime.  Plan to treat for minimum of  5 days.  Discontinue IV fluids.  Lactic acid and procalcitonin levels were normal.  Urinalysis was unremarkable. TSH was normal.  Persistent sinus tachycardia may be due to acute illness.    Acute hypoxic respiratory failure: She was weaned off of oxygen today and she is tolerating room air. secondary to b/l PE, hemoptysis & presumed aspiration. S/p bronchoscopy x 2. S/p intubation, ventilation & extubation. Continue bronchodilators and incentive spirometry as needed.   Recent aspiration pneumonia: Initially completed antibiotics on 03/13/2022.     Bilateral pulmonary embolism, bilateral lower extremity DVT: w/ recent COVID19 infection approx 3 weeks prior to admission. Echo in 11/23 showed EF 40-98%, normal diastolic function. S/p thrombectomy 11/15.  Continue Eliquis and metoprolol.   Acute metabolic encephalopathy: MRI brain: no acute infarct but does reveal numerous chronic microhemorrhages which are new from scan a from a few months ago. Continue w/ PT/OT.  Okay for anticoagulation from Dr. Johny Chess, (neurologist) perspective (see note 03/13/2022).   Generalized weakness: Patient is very deconditioned.  Plan to transfer to long-term acute care hospital for treatment and rehabilitation.   Hyperglycemia, prediabetes: Hemoglobin A1c 6.0.  Monitor glucose level and treat accordingly.   Dysphagia: She remains NPO.  Follow-up with speech therapist for further recommendations.  Continue enteral nutrition.   Normocytic anemia: H&H trending down but stable.  No need for blood transfusion at this time.  Continue to monitor.   Persistent leukocytosis, neutrophilia, monocytosis: Follow-up with Dr. Tasia Catchings, oncologist, as an outpatient for bone marrow biopsy.   History of multiple sclerosis, wheelchair-bound at baseline     Plan to transfer to [facility] today.  Case discussed with Raquel Sarna (liaison) at [phone].  She says she is familiar with the patient and will be able to manage on the patient at their facility.  The only problem today is getting transportation.  They are still working on it.    Discharge plan discussed with Ms. Solon Augusta, sister, over the phone.  Plan was  also discussed with Shanon Brow, boyfriend at the bedside.       Consultants: Neurologist, oncologist, intensivist, vascular surgeon  Procedures performed:  Mechanical thrombectomy to right lower lobe, right middle lobe, right upper lobe pulmonary arteries, left lower lobe, left upper lobe pulmonary arteries 02/25/2022   Disposition:  [facility] Diet recommendation:  NPO on enteral nutrition via nasogastric tube DISCHARGE MEDICATION: Allergies as of 03/20/2022       Reactions   Erythromycin Hives, Nausea And Vomiting   Lexapro [escitalopram Oxalate] Hives        Medication List     STOP taking these medications    meloxicam 15 MG tablet Commonly known as: MOBIC   methylPREDNISolone 4 MG Tbpk tablet Commonly known as: MEDROL DOSEPAK   naproxen 500 MG tablet Commonly known as: NAPROSYN   nitrofurantoin (macrocrystal-monohydrate) 100 MG capsule Commonly known as: MACROBID   omeprazole 20 MG capsule Commonly known as: PRILOSEC   propranolol 20 MG tablet Commonly known as: INDERAL   tolterodine 2 MG 24 hr capsule Commonly known as: DETROL LA   traMADol 50 MG tablet Commonly known as: ULTRAM       TAKE these medications    apixaban 5 MG Tabs tablet Commonly known as: ELIQUIS Place 1 tablet (5 mg total) into feeding tube 2 (two) times daily.   ascorbic acid 500 MG tablet Commonly known as: VITAMIN C Take by mouth.   ceFEPIme 2 g in sodium chloride 0.9 % 100 mL Inject 2 g into  the vein every 8 (eight) hours for 5 days.   Cholecalciferol 50 MCG (2000 UT) Caps Take by mouth.   cyanocobalamin 1000 MCG tablet Place 1 tablet (1,000 mcg total) into feeding tube daily. Start taking on: March 21, 2022 What changed:  how much to take how to take this when to take this   cyclobenzaprine 10 MG tablet Commonly known as: FLEXERIL Take 10 mg by mouth 2 (two) times daily as needed.   dalfampridine 10 MG Tb12 Take 1 tablet by mouth every 12 (twelve)  hours.   DULoxetine 30 MG capsule Commonly known as: CYMBALTA Take 30 mg by mouth daily.   feeding supplement (OSMOLITE 1.5 CAL) Liqd Place 1,000 mLs into feeding tube continuous.   nutrition supplement (JUVEN) Pack Place 1 packet into feeding tube 2 (two) times daily between meals. Start taking on: March 21, 2022   feeding supplement (PROSource TF20) liquid Place 60 mLs into feeding tube daily. Start taking on: March 21, 2022   free water Soln Place 100 mLs into feeding tube every 4 (four) hours.   gabapentin 400 MG capsule Commonly known as: NEURONTIN Take Gabapentin 400 mg in the morning, 400 mg in the afternoon, and 800 mg at night   metoprolol tartrate 25 MG tablet Commonly known as: LOPRESSOR Place 1 tablet (25 mg total) into feeding tube every 6 (six) hours.   pregabalin 75 MG capsule Commonly known as: LYRICA Take 75 mg by mouth 3 (three) times daily.   rOPINIRole 0.25 MG tablet Commonly known as: REQUIP Take 0.25 mg at night for 1 week, then increase to 0.5 mg at night               Discharge Care Instructions  (From admission, onward)           Start     Ordered   03/20/22 0000  Discharge wound care:       Comments: Silicone foam dressings to the right foot wound, ok to lift to apply ointment BID, change every 3 days. Assess under dressings each shift for any acute changes in the wounds.   03/20/22 1653            Discharge Exam: Filed Weights   03/18/22 0406 03/19/22 0401 03/20/22 0500  Weight: 64.3 kg 61.2 kg 63.8 kg   GEN: NAD SKIN: Warm and dry EYES: No pallor or icterus ENT: MMM, NG tube in place CV: RRR PULM: Bilateral coarse breath sounds ABD: soft, ND, NT, +BS CNS: AAO x 1 (person), quadriplegic EXT: No edema or tenderness      Condition at discharge: stable  The results of significant diagnostics from this hospitalization (including imaging, microbiology, ancillary and laboratory) are listed below for reference.    Imaging Studies: CT CHEST ABDOMEN PELVIS W CONTRAST  Result Date: 03/19/2022 CLINICAL DATA:  Sepsis.  Shortness of breath. EXAM: CT CHEST, ABDOMEN, AND PELVIS WITH CONTRAST TECHNIQUE: Multidetector CT imaging of the chest, abdomen and pelvis was performed following the standard protocol during bolus administration of intravenous contrast. RADIATION DOSE REDUCTION: This exam was performed according to the departmental dose-optimization program which includes automated exposure control, adjustment of the mA and/or kV according to patient size and/or use of iterative reconstruction technique. CONTRAST:  160m OMNIPAQUE IOHEXOL 300 MG/ML  SOLN COMPARISON:  03/03/2022 FINDINGS: CT CHEST FINDINGS Cardiovascular: Heart size is normal. No pericardial effusion. Central pulmonary arteries appear patent. Previously noted bilateral lower lobe pulmonary emboli have resolved in the interval. Mediastinum/Nodes: 1.6 cm nodule  is identified within the left lobe of thyroid gland, image 8/2. Recommend thyroid US (ref: J Am Coll Radiol. 2015 Feb;12(2): 143-50). The trachea appears patent. Nasogastric tube is identified within the esophagus. No enlarged axillary, mediastinal, or hilar lymph nodes. Lungs/Pleura: No pleural fluid. Multifocal bilateral upper and lower lobe interstitial and airspace opacities are identified. This is most severe within the posterior left lower lobe and anterior right lower lobe. Musculoskeletal: No chest wall mass or suspicious bone lesions identified. CT ABDOMEN PELVIS FINDINGS Hepatobiliary: No focal liver abnormality is seen. No gallstones, gallbladder wall thickening, or biliary dilatation. Pancreas: Unremarkable. No pancreatic ductal dilatation or surrounding inflammatory changes. Spleen: Normal in size without focal abnormality. Adrenals/Urinary Tract: Adrenal glands are unremarkable. Kidneys are normal, without renal calculi, focal lesion, or hydronephrosis. New heterogeneous, hyperdense debris  within the posterolateral aspect of the bladder measuring 2.5 x 1.2 cm, image 110/2. Not seen on recent CT from 03/03/2022. Stomach/Bowel: Nasogastric tube is in the body of the stomach. Small hiatal hernia. The appendix is visualized and appears normal. No bowel wall thickening, inflammation, or distension. Vascular/Lymphatic: Normal appearance of the abdominal aorta. No abdominopelvic adenopathy identified. Reproductive: Uterus and bilateral adnexa are unremarkable. Other: No abdominal wall hernia or abnormality. No abdominopelvic ascites. Musculoskeletal: No acute or significant osseous findings. IMPRESSION: 1. Multifocal bilateral upper and lower lobe interstitial and airspace opacities are identified. This is most severe within the posterior left lower lobe and anterior right lower lobe. Imaging findings are favored to represent multifocal pneumonia. 2. No acute abnormality noted within the abdomen or pelvis. 3. New heterogeneous, hyperdense material within the posterolateral aspect of the bladder measuring 2.5 x 1.2 cm. Not seen on recent CT from 03/03/2022. This is nonspecific, but favored to represent a small amount of hemorrhagic or post infectious/inflammatory debris. 4. 1.6 cm nodule within the left lobe of thyroid gland. Recommend thyroid US (ref: J Am Coll Radiol. 2015 Feb;12(2): 143-50). Electronically Signed   By: Kerby Moors M.D.   On: 03/19/2022 18:37   DG Chest Port 1 View  Result Date: 03/19/2022 CLINICAL DATA:  Fever. EXAM: PORTABLE CHEST 1 VIEW COMPARISON:  Prior chest radiographs, most recently done 03/14/2022. CT 03/03/2022. FINDINGS: 0943 hours. Interval extubation. Enteric tube projects below the diaphragm. The heart size and mediastinal contours are stable. Previously demonstrated multifocal airspace opacities in both lungs show interval mild improvement. The right upper lobe density seen on the most recent study has not progressed. No pneumothorax or significant pleural effusion is  identified. The bones appear unchanged. IMPRESSION: Interval extubation with further mild improvement in bilateral airspace opacities. No new findings. Electronically Signed   By: Richardean Sale M.D.   On: 03/19/2022 10:21   DG Abd 1 View  Result Date: 03/15/2022 CLINICAL DATA:  NG tube placement. EXAM: ABDOMEN - 1 VIEW COMPARISON:  CT 03/03/2022 FINDINGS: Tip of the enteric tube below the diaphragm in the stomach, the side port is just beyond the gastroesophageal junction. Nonspecific upper abdominal bowel gas pattern, generalized paucity of bowel gas. IMPRESSION: Tip of the enteric tube below the diaphragm in the stomach, side-port just beyond the gastroesophageal junction. Electronically Signed   By: Keith Rake M.D.   On: 03/15/2022 11:32   CT HEAD WO CONTRAST (5MM)  Result Date: 03/14/2022 CLINICAL DATA:  Altered mental status, on heparin drip. History of micro hemorrhages previously EXAM: CT HEAD WITHOUT CONTRAST TECHNIQUE: Contiguous axial images were obtained from the base of the skull through the vertex without intravenous contrast. RADIATION  DOSE REDUCTION: This exam was performed according to the departmental dose-optimization program which includes automated exposure control, adjustment of the mA and/or kV according to patient size and/or use of iterative reconstruction technique. COMPARISON:  03/09/2022 FINDINGS: Brain: No acute finding. No visible infarct or hemorrhage. No mass, hydrocephalus, or collection. Small ovoid low densities around the lateral ventricles correlating with history of multiple sclerosis. Vascular: No hyperdense vessel or unexpected calcification. Skull: Normal. Negative for fracture or focal lesion. Sinuses/Orbits: No acute finding. IMPRESSION: No acute finding.  Negative for intracranial hemorrhage. Electronically Signed   By: Jorje Guild M.D.   On: 03/14/2022 12:23   DG Chest Port 1 View  Result Date: 03/14/2022 CLINICAL DATA:  Acute respiratory failure.  Endotracheally intubated. EXAM: PORTABLE CHEST 1 VIEW COMPARISON:  03/08/2022 FINDINGS: Endotracheal tube and nasogastric tube remain in appropriate position. Airspace disease in both lower lobes shows mild improvement since previous exam. Ill-defined airspace opacity is seen in the right upper lobe which is new since prior exam. IMPRESSION: Mild improvement of bilateral lower lobe airspace disease. New ill-defined airspace opacity in right upper lobe. Electronically Signed   By: Marlaine Hind M.D.   On: 03/14/2022 09:49   US Venous Img Lower Bilateral (DVT)  Result Date: 03/13/2022 CLINICAL DATA:  Pulmonary embolism EXAM: BILATERAL LOWER EXTREMITY VENOUS DOPPLER ULTRASOUND TECHNIQUE: Gray-scale sonography with graded compression, as well as color Doppler and duplex ultrasound were performed to evaluate the lower extremity deep venous systems from the level of the common femoral vein and including the common femoral, femoral, profunda femoral, popliteal and calf veins including the posterior tibial, peroneal and gastrocnemius veins when visible. The superficial great saphenous vein was also interrogated. Spectral Doppler was utilized to evaluate flow at rest and with distal augmentation maneuvers in the common femoral, femoral and popliteal veins. COMPARISON:  CTA PE and CT AP, 03/03/2022. FINDINGS: RIGHT LOWER EXTREMITY VENOUS Normal compressibility of the RIGHT common femoral, superficial femoral, and popliteal veins. Visualized portions of profunda femoral vein and great saphenous vein unremarkable. Non compressibility with filling defect within the imaged RIGHT peroneal vein. OTHER No evidence of superficial thrombophlebitis or abnormal fluid collection. Limitations: Patient body habitus LEFT LOWER EXTREMITY VENOUS Normal compressibility of the LEFT common femoral vein. Homogeneously echogenic, occlusive and non-compressible filling defect within the imaged portions of the LEFT profunda, SFV, popliteal and  imaged portions of the LEFT calf veins. OTHER No evidence of superficial thrombophlebitis or abnormal fluid collection. Limitations: Patient body habitus IMPRESSION: Examination is POSITIVE for bilateral lower extremity DVT, greater on LEFT with femoropopliteal involvement and within the RIGHT peroneal vein. Michaelle Birks, MD [facility] Electronically Signed   By: Michaelle Birks M.D.   On: 03/13/2022 10:10   MR BRAIN W WO CONTRAST  Result Date: 03/09/2022 CLINICAL DATA:  Mental status change, unknown cause. Acute metabolic encephalopathy. History of COVID and pulmonary embolism requiring thrombectomy on 02/25/2022. History of multiple sclerosis. EXAM: MRI HEAD WITHOUT AND WITH CONTRAST TECHNIQUE: Multiplanar, multiecho pulse sequences of the brain and surrounding structures were obtained without and with intravenous contrast. CONTRAST:  70m GADAVIST GADOBUTROL 1 MMOL/ML IV SOLN COMPARISON:  Head CT 03/07/2022 and MRI 09/23/2021 FINDINGS: Some sequences are mildly to moderately motion degraded. Brain: There is no evidence of an acute infarct, intracranial hemorrhage, mass, midline shift, or extra-axial fluid collection. There is mild cerebral atrophy. There are there are multiple new foci of chronic microhemorrhage scattered throughout both cerebral hemispheres (including in the splenium of  the corpus callosum), and there are also multiple new small foci of chronic hemorrhage in both cerebellar hemispheres. Punctate foci of susceptibility are also noted in the occipital horns of the lateral ventricles. There is no associated enhancement. Numerous foci of T2 FLAIR hyperintensity in the juxtacortical, deep, and periventricular white matter bilaterally have not appreciably changed from the prior MRI. Small lesions in the midbrain, pons, and possibly right cerebellum are also grossly unchanged within limitations of motion artifact. Multiple black holes are  noted on T1 weighted images. No lesions demonstrate restricted diffusion or enhancement. Vascular: Major intracranial vascular flow voids are preserved. Skull and upper cervical spine: Unremarkable bone marrow signal. Sinuses/Orbits: Unremarkable orbits. Prominent mucosal thickening in the right maxillary sinus. Moderate left mastoid effusion. Other: None. IMPRESSION: 1. Motion degraded examination.  No acute infarct. 2. Numerous foci of chronic cerebral and cerebellar microhemorrhage which are new from 09/23/2021, of indeterminate etiology. Considerations include emboli, sequelae of COVID infection or other critical illness, and vasculitis. There are also trace chronic blood products or other debris in the occipital horns of the lateral ventricles. 3. Unchanged white matter disease consistent with multiple sclerosis. No evidence of active demyelination. Electronically Signed   By: Logan Bores M.D.   On: 03/09/2022 16:26   EEG adult  Result Date: 03/09/2022 Lora Havens, MD     03/09/2022  2:24 PM Patient Name: Grae Leathers MRN: [id number] Epilepsy Attending: Lora Havens Referring Physician/Provider: Bradly Bienenstock, NP Date: 03/09/2022 Duration: 27.49 mins Patient history: 61 year old female with altered mental status.  EEG to evaluate for seizure. Level of alertness: Awake, asleep AEDs during EEG study: None Technical aspects: This EEG study was done with scalp electrodes positioned according to the 10-20 International system of electrode placement. Electrical activity was reviewed with band pass filter of 1-_0 , sensitivity of 7 uV/mm, display speed of 1m/sec with a _1  notched filter applied as appropriate. EEG data were recorded continuously and digitally stored.  Video monitoring was available and reviewed as appropriate. Description: The posterior dominant rhythm consists of 8-9 Hz activity of moderate voltage (25-35 uV) seen predominantly in posterior head regions, symmetric and  reactive to eye opening and eye closing. Sleep was characterized by vertex waves, sleep spindles (12 to 14 Hz), maximal frontocentral region. EEG showed intermittent generalized 3 to 6 Hz theta-delta slowing. Hyperventilation and photic stimulation were not performed.   ABNORMALITY - Intermittent slow, generalized IMPRESSION: This study is suggestive of mild diffuse encephalopathy, nonspecific etiology. No seizures or epileptiform discharges were seen throughout the recording. Priyanka O Yadav   [place]  Result Date: 03/08/2022 CLINICAL DATA:  Fever EXAM: RENAL / URINARY TRACT ULTRASOUND COMPLETE COMPARISON:  None Available. FINDINGS: Right Kidney: Renal measurements: 9.9 x 4.5 x 5.3 cm = volume: 121.4 mL. Echogenicity within normal limits. No mass or hydronephrosis visualized. Left Kidney: Renal measurements: 10.1 x 5.2 x 5.6 cm = volume: 153.1 mL. Echogenicity within normal limits. No mass or hydronephrosis visualized. Bladder: Urinary bladder is collapsed about the Foley's catheter. Other: None. IMPRESSION: 1. No hydronephrosis. 2. Urinary bladder is collapsed about the Foley's catheter. Electronically Signed   By: IKeane PoliceD.O.   On: 03/08/2022 21:33   DG Chest Port 1 View  Result Date: 03/08/2022 CLINICAL DATA:  Chronic dyspnea.  History of bilateral PEs. EXAM: PORTABLE CHEST 1 VIEW COMPARISON:  03/06/2022 FINDINGS: Tracheostomy tube tip is stable above the carina. There is an enteric tube with tip and side port below the GE junction. Stable  cardiomediastinal contours. Bilateral interstitial and airspace opacities are again noted, not significantly improved from previous exam. IMPRESSION: 1. No significant change in aeration to the lungs compared with previous exam. 2. Stable support apparatus. Electronically Signed   By: Kerby Moors M.D.   On: 03/08/2022 09:05   CT HEAD WO CONTRAST (5MM)  Result Date: 03/07/2022 CLINICAL DATA:  Altered mental status EXAM: CT HEAD WITHOUT CONTRAST  TECHNIQUE: Contiguous axial images were obtained from the base of the skull through the vertex without intravenous contrast. RADIATION DOSE REDUCTION: This exam was performed according to the departmental dose-optimization program which includes automated exposure control, adjustment of the mA and/or kV according to patient size and/or use of iterative reconstruction technique. COMPARISON:  Brain MRI 09/23/2021 FINDINGS: Brain: There is no acute intracranial hemorrhage, extra-axial fluid collection, or acute infarct. Parenchymal volume is normal. The ventricles are normal in size. Gray-white differentiation is preserved. Hypodense lesions in the supratentorial white matter consistent with the history of multiple sclerosis are noted but better seen on prior MRI. There is no mass lesion.  There is no mass effect or midline shift. Vascular: No hyperdense vessel or unexpected calcification. Skull: Normal. Negative for fracture or focal lesion. Sinuses/Orbits: There is opacification of the imaged portion of the right maxillary sinus with a small amount of layering fluid in the imaged left maxillary sinus. There is a dysconjugate gaze. The globes and orbits are otherwise unremarkable. Other: There is a left mastoid effusion. IMPRESSION: 1. No acute intracranial pathology. 2. Dysconjugate gaze. 3. Hypodense lesions in the supratentorial white matter consistent with multiple sclerosis, better seen on prior MRI. 4. Paranasal sinus disease with layering fluid in the imaged left maxillary sinus which could reflect acute sinusitis in the correct clinical setting. 5. Left mastoid effusion. Electronically Signed   By: Valetta Mole M.D.   On: 03/07/2022 18:54   DG Chest Port 1 View  Result Date: 03/06/2022 CLINICAL DATA:  Dyspnea EXAM: PORTABLE CHEST 1 VIEW COMPARISON:  Chest radiograph dated 03/03/2022. FINDINGS: The heart size and mediastinal contours are within normal limits. Moderate to severe bilateral interstitial and  airspace opacities appear increased from prior exam. A small right pleural effusion likely contributes. There is no left pleural effusion. There is no pneumothorax on either side. An endotracheal tube terminates in the upper thoracic trachea. An enteric tube terminates in the stomach. The osseous structures are unremarkable. IMPRESSION: Moderate to severe bilateral interstitial and airspace opacities appear increased from prior exam. A small right pleural effusion likely contributes. Electronically Signed   By: Zerita Boers M.D.   On: 03/06/2022 08:17   CT Angio Chest Pulmonary Embolism (PE) W or WO Contrast  Result Date: 03/03/2022 CLINICAL DATA:  Pulmonary embolism (PE) suspected, high prob EXAM: CT ANGIOGRAPHY CHEST WITH CONTRAST TECHNIQUE: Multidetector CT imaging of the chest was performed using the standard protocol during bolus administration of intravenous contrast. Multiplanar CT image reconstructions and MIPs were obtained to evaluate the vascular anatomy. RADIATION DOSE REDUCTION: This exam was performed according to the departmental dose-optimization program which includes automated exposure control, adjustment of the mA and/or kV according to patient size and/or use of iterative reconstruction technique. CONTRAST:  153m OMNIPAQUE IOHEXOL 350 MG/ML SOLN COMPARISON:  02/24/2022 FINDINGS: Cardiovascular: Bilateral lower lobe pulmonary emboli are noted. Overall clot burden appears slightly reduced when compared to prior study. Elevated RV to LV ratio of 1.42, similar to prior study compatible with right heart dysfunction. Mediastinum/Nodes: Esophagus is dilated and fluid-filled. NG tube is in  place terminating in the stomach. Endotracheal tube tip is just above the carina. No mediastinal, hilar, or axillary adenopathy. Lungs/Pleura: Extensive bilateral lower lobe airspace disease and ground-glass opacities in the upper lobes. Findings are worsening since prior study. Trace right pleural effusion.  Upper Abdomen: Fluid-filled stomach. Gaseous distention of the colon. See abdominal CT report for further discussion. Musculoskeletal: No acute bony abnormality. Review of the MIP images confirms the above findings. IMPRESSION: Bilateral pulmonary emboli. Overall clot burden slightly decreased since previous study. Continued elevated RV to LV ratio of 1.42, similar to prior study compatible with right heart dysfunction. Worsening bilateral airspace disease, most confluent in the lower lobes. Trace right pleural effusion. Distended, fluid-filled stomach and esophagus. Esophageal fluid likely related to reflux. NG tube is in the stomach. Electronically Signed   By: Rolm Baptise M.D.   On: 03/03/2022 22:18   CT ABDOMEN PELVIS W CONTRAST  Result Date: 03/03/2022 CLINICAL DATA:  Sepsis EXAM: CT ABDOMEN AND PELVIS WITH CONTRAST TECHNIQUE: Multidetector CT imaging of the abdomen and pelvis was performed using the standard protocol following bolus administration of intravenous contrast. RADIATION DOSE REDUCTION: This exam was performed according to the departmental dose-optimization program which includes automated exposure control, adjustment of the mA and/or kV according to patient size and/or use of iterative reconstruction technique. CONTRAST:  148m OMNIPAQUE IOHEXOL 350 MG/ML SOLN COMPARISON:  06/26/2021 FINDINGS: Lower chest: Bilateral lower lobe pulmonary emboli noted. Bilateral lower lobe airspace disease. See chest CT report for further discussion. Hepatobiliary: No focal hepatic abnormality. Gallbladder unremarkable. Pancreas: No focal abnormality or ductal dilatation. Spleen: No focal abnormality.  Normal size. Adrenals/Urinary Tract: No adrenal abnormality. No focal renal abnormality. No stones or hydronephrosis. Urinary bladder is unremarkable. Stomach/Bowel: Diffuse gaseous distention of the colon, likely ileus. Stomach and small bowel decompressed, grossly unremarkable. NG tube tip is in the distal  stomach. Vascular/Lymphatic: No evidence of aneurysm or adenopathy. Reproductive: Uterus and adnexa unremarkable.  No mass. Other: No free fluid or free air. Musculoskeletal: No acute bony abnormality. IMPRESSION: Bilateral lower lobe pulmonary emboli with bilateral airspace disease. See chest CT for further discussion. Gaseous distention of the colon diffusely suggests ileus. No evidence of bowel obstruction. NG tube in the stomach. Electronically Signed   By: KRolm BaptiseM.D.   On: 03/03/2022 22:12   DG Chest Port 1 View  Result Date: 03/03/2022 CLINICAL DATA:  History of PE EXAM: PORTABLE CHEST 1 VIEW COMPARISON:  Chest x-ray dated March 10, 2022 FINDINGS: Cardiac and mediastinal contours are within normal limits unchanged position of the ET tube and gastric decompression tube. Unchanged left-greater-than-right heterogeneous opacities. No evidence of pleural effusion or pneumothorax. IMPRESSION: 1. Unchanged left-greater-than-right heterogeneous opacities. 2. Unchanged position of ET tube and gastric decompression tube. Electronically Signed   By: LYetta GlassmanM.D.   On: 03/03/2022 13:56   DG Chest Port 1 View  Result Date: 03/02/2022 CLINICAL DATA:  [id number] respiratory failure with hypoxia ([facility] [id number]: PORTABLE CHEST 1 VIEW COMPARISON:  Chest radiograph from one day prior. FINDINGS: Endotracheal tube tip is 5.5 cm above the carina. Enteric tube enters stomach with the tip not seen on this image. Stable cardiomediastinal silhouette with normal heart size. No pneumothorax. No pleural effusion. Patchy hazy opacities in the mid to lower lungs bilaterally, left greater than right, slightly improved. IMPRESSION: 1. Well-positioned endotracheal and enteric tubes. No pneumothorax. 2. Patchy hazy opacities in the mid to lower lungs bilaterally, left greater than right, slightly improved, favor improving pneumonia. Electronically  Signed   By: Ilona Sorrel M.D.   On: 03/02/2022 08:25   DG  Chest Port 1 View  Result Date: 03/01/2022 CLINICAL DATA:  Acute respiratory failure, hypoxia EXAM: PORTABLE CHEST 1 VIEW COMPARISON:  Prior chest x-ray 02/28/2022 FINDINGS: The endotracheal tube is 3.6 cm above the carina. Gastric tube remains in unchanged position. Similar appearance of the lungs with diffuse bilateral interstitial and airspace opacities more confluent on the left than the right. No pneumothorax. No large effusion. Stable cardiomegaly. IMPRESSION: 1. Overall, similar appearance of the lungs with diffuse bilateral interstitial and airspace opacities more confluent on the left than the right. As before, this could represent pulmonary edema, multifocal pneumonia, or ARDS. 2. Stable and satisfactory support apparatus. Electronically Signed   By: Jacqulynn Cadet M.D.   On: 03/01/2022 07:06   DG Chest Port 1 View  Result Date: 02/28/2022 CLINICAL DATA:  5727 through 3 with hypoxic respiratory failure, ventilator dependent. EXAM: PORTABLE CHEST 1 VIEW COMPARISON:  Portable chest yesterday at 8:39 a.m. FINDINGS: 5:08 a.m. ETT tip is 3.9 cm from the carina. Interval NGT insertion which is well inside the stomach but neither the side-hole or tip are filmed. Multiple overlying monitor wires. The heart is enlarged. Central vascular distension is still seen but has improved mildly. Widespread interstitial and patchy alveolar consolidative opacities are again noted, with relative apical sparing. The opacities are increasingly dense in the left midlung today, and elsewhere minimally less dense than previously. There are minimal pleural effusions. Findings could be due to edema, pneumonia or combination. Mediastinal configuration is stable. No acute osseous findings. IMPRESSION: 1. Support apparatus as above. 2. Slight improvement in central vascular distension. 3. Widespread interstitial and patchy alveolar consolidative opacities, with increasingly dense opacities in the left mid lung today, and  elsewhere minimally less dense than previously. Findings could be due to edema, pneumonia or combination. Electronically Signed   By: Telford Nab M.D.   On: 02/28/2022 07:16   DG Abd 1 View  Result Date: 02/27/2022 CLINICAL DATA:  [id number] Encounter for orogastric (OG) tube placement [id number] EXAM: ABDOMEN - 1 VIEW COMPARISON:  None Available. FINDINGS: Orogastric tube tip and side port overlie the stomach. Nonobstructive bowel gas pattern. IMPRESSION: Orogastric tube tip and side port overlie the stomach. Electronically Signed   By: Maurine Simmering M.D.   On: 02/27/2022 12:27   DG Chest Port 1 View  Result Date: 02/27/2022 CLINICAL DATA:  Acute respiratory failure EXAM: PORTABLE CHEST 1 VIEW COMPARISON:  02/25/22 CXR FINDINGS: Endotracheal tube terminates approximately 4 cm above the carina. Compared to prior exam there is interval increase in bilateral patchy pulmonary opacities. Cardiac and mediastinal contours are unchanged from prior exam. Visualized upper abdomen notable for a distended stomach, slightly improved from prior. No displaced rib fractures. IMPRESSION: 1. Endotracheal tube terminates approximately 4 cm above the carina. 2. Interval increase in bilateral patchy pulmonary opacities, nonspecific, but worrisome for multifocal infection. Electronically Signed   By: Marin Roberts M.D.   On: 02/27/2022 09:08   DG Chest Port 1 View  Result Date: 02/25/2022 CLINICAL DATA:  Respiratory failure, EXAM: PORTABLE CHEST 1 VIEW COMPARISON:  02/25/2022 FINDINGS: Endotracheal tube seen 3.5 cm above the carina. Pulmonary insufflation has slightly diminished since prior examination but remains normal and symmetric. Bibasilar pulmonary infiltrates have progressed in the interval in keeping with infection or inflammation. No pneumothorax or pleural effusion. Cardiac size within normal limits. No acute bone abnormality. IMPRESSION: 1. Endotracheal tube in appropriate position. 2.  Progressive bibasilar pulmonary  infiltrates in keeping with infection or inflammation. Electronically Signed   By: Fidela Salisbury M.D.   On: 02/25/2022 19:41   DG Chest Port 1 View  Result Date: 02/25/2022 CLINICAL DATA:  Hypoxia, intubated EXAM: PORTABLE CHEST 1 VIEW COMPARISON:  02/24/2022 FINDINGS: Single frontal view of the chest demonstrates endotracheal tube overlying tracheal air column, tip midway between thoracic inlet and carina. Stable small right pleural effusion. There is bibasilar consolidation, with marked progression on the left since previous exams. Given findings of concurrent pulmonary embolus, pulmonary infarct could be considered though aspiration, pneumonia, and edema could give a similar pattern. No pneumothorax. Cardiac silhouette is unremarkable. No acute bony abnormalities. IMPRESSION: 1. Endotracheal tube as above. 2. Progressive bibasilar airspace disease, left greater than right. This could reflect developing pulmonary infarcts given history of pulmonary emboli, though aspiration, edema, and infection could give a similar pattern. 3. Stable small right pleural effusion. Electronically Signed   By: Randa Ngo M.D.   On: 02/25/2022 16:24   PERIPHERAL VASCULAR CATHETERIZATION  Result Date: 02/25/2022 See surgical note for result.  ECHOCARDIOGRAM COMPLETE  Result Date: 02/24/2022    ECHOCARDIOGRAM REPORT   Patient Name:   [name] Date of Exam: 02/24/2022 Medical Rec #:  [id number]             Height:       62.0 in Accession #:    [id number]            Weight:       126.1 lb Date of Birth:  Aug 20, 1960             BSA:          1.571 m Patient Age:    8 years              BP:           155/65 mmHg Patient Gender: F                     HR:           150 bpm. Exam Location:  [facility] Procedure: 2D Echo, Cardiac Doppler and Color Doppler Indications:     I26.09 Pulmonary Embolus  History:         Patient has no prior history of Echocardiogram examinations.                  Multiple Sclerosis.   Sonographer:     Cresenciano Lick RDCS Referring Phys:  [id number] AMY N COX Diagnosing Phys: Yolonda Kida MD IMPRESSIONS  1. Left ventricular ejection fraction, by estimation, is 60 to 65%. The left ventricle has normal function. The left ventricle has no regional wall motion abnormalities. Left ventricular diastolic parameters were normal.  2. Right ventricular systolic function is normal. The right ventricular size is normal.  3. The mitral valve is normal in structure. Trivial mitral valve regurgitation.  4. The aortic valve is normal in structure. Aortic valve regurgitation is not visualized. FINDINGS  Left Ventricle: Left ventricular ejection fraction, by estimation, is 60 to 65%. The left ventricle has normal function. The left ventricle has no regional wall motion abnormalities. The left ventricular internal cavity size was normal in size. There is  no left ventricular hypertrophy. Left ventricular diastolic parameters were normal. Right Ventricle: The right ventricular size is normal. No increase in right ventricular wall thickness. Right ventricular systolic function is normal. Left Atrium: Left atrial size was normal in  size. Right Atrium: Right atrial size was normal in size. Pericardium: There is no evidence of pericardial effusion. Mitral Valve: The mitral valve is normal in structure. Trivial mitral valve regurgitation. Tricuspid Valve: The tricuspid valve is normal in structure. Tricuspid valve regurgitation is not demonstrated. Aortic Valve: The aortic valve is normal in structure. Aortic valve regurgitation is not visualized. Pulmonic Valve: The pulmonic valve was normal in structure. Pulmonic valve regurgitation is not visualized. Aorta: The ascending aorta was not well visualized. IAS/Shunts: No atrial level shunt detected by color flow Doppler.  LEFT VENTRICLE PLAX 2D LVIDd:         3.70 cm LVIDs:         2.50 cm LV PW:         0.80 cm LV IVS:        0.80 cm LVOT diam:     1.70 cm LVOT  Area:     2.27 cm  RIGHT VENTRICLE RV Basal diam:  3.70 cm RV S prime:     34.60 cm/s TAPSE (M-mode): 2.3 cm LEFT ATRIUM             Index        RIGHT ATRIUM          Index LA diam:        3.60 cm 2.29 cm/m   RA Area:     9.98 cm LA Vol (A2C):   19.7 ml 12.54 ml/m  RA Volume:   22.70 ml 14.45 ml/m LA Vol (A4C):   22.9 ml 14.57 ml/m LA Biplane Vol: 22.8 ml 14.51 ml/m   AORTA Ao Root diam: 3.00 cm Ao Asc diam:  3.10 cm TRICUSPID VALVE TR Peak grad:   49.3 mmHg TR Vmax:        351.00 cm/s  SHUNTS Systemic Diam: 1.70 cm Yolonda Kida MD Electronically signed by Yolonda Kida MD Signature Date/Time: 02/24/2022/11:42:30 PM    Final    US Venous Img Lower Bilateral (DVT)  Result Date: 02/24/2022 CLINICAL DATA:  History of pulmonary embolus. EXAM: BILATERAL LOWER EXTREMITY VENOUS DOPPLER ULTRASOUND TECHNIQUE: Gray-scale sonography with graded compression, as well as color Doppler and duplex ultrasound were performed to evaluate the lower extremity deep venous systems from the level of the common femoral vein and including the common femoral, femoral, profunda femoral, popliteal and calf veins including the posterior tibial, peroneal and gastrocnemius veins when visible. The superficial great saphenous vein was also interrogated. Spectral Doppler was utilized to evaluate flow at rest and with distal augmentation maneuvers in the common femoral, femoral and popliteal veins. COMPARISON:  CT scan of same day. FINDINGS: RIGHT LOWER EXTREMITY Common Femoral Vein: No evidence of thrombus. Normal compressibility, respiratory phasicity and response to augmentation. Saphenofemoral Junction: No evidence of thrombus. Normal compressibility and flow on color Doppler imaging. Profunda Femoral Vein: No evidence of thrombus. Normal compressibility and flow on color Doppler imaging. Femoral Vein: No evidence of thrombus. Normal compressibility, respiratory phasicity and response to augmentation. Popliteal Vein: No  evidence of thrombus. Normal compressibility, respiratory phasicity and response to augmentation. Calf Veins: There appears to be occlusive thrombus in the peroneal vein. Venous Reflux:  None. Other Findings:  None. LEFT LOWER EXTREMITY Common Femoral Vein: No evidence of thrombus. Normal compressibility, respiratory phasicity and response to augmentation. Saphenofemoral Junction: No evidence of thrombus. Normal compressibility and flow on color Doppler imaging. Profunda Femoral Vein: No evidence of thrombus. Normal compressibility and flow on color Doppler imaging. Femoral Vein: No evidence of  thrombus. Normal compressibility, respiratory phasicity and response to augmentation. Popliteal Vein: No evidence of thrombus. Normal compressibility, respiratory phasicity and response to augmentation. Calf Veins: There appears to be occlusive thrombus in the peroneal vein. Superficial Great Saphenous Vein: No evidence of thrombus. Normal compressibility. Venous Reflux:  None. Other Findings:  None. IMPRESSION: Occlusive thrombus is noted in the peroneal veins bilaterally. Electronically Signed   By: Marijo Conception M.D.   On: 02/24/2022 18:54   CT Angio Chest PE W and/or Wo Contrast  Result Date: 02/24/2022 CLINICAL DATA:  Shortness of breath, right chest pain EXAM: CT ANGIOGRAPHY CHEST WITH CONTRAST TECHNIQUE: Multidetector CT imaging of the chest was performed using the standard protocol during bolus administration of intravenous contrast. Multiplanar CT image reconstructions and MIPs were obtained to evaluate the vascular anatomy. RADIATION DOSE REDUCTION: This exam was performed according to the departmental dose-optimization program which includes automated exposure control, adjustment of the mA and/or kV according to patient size and/or use of iterative reconstruction technique. CONTRAST:  38m OMNIPAQUE IOHEXOL 350 MG/ML SOLN COMPARISON:  Chest radiograph done earlier today FINDINGS: Cardiovascular: Heart is  enlarged in size. RV LV ratio is 1.3. This may be recent or chronic. There is homogeneous enhancement in thoracic aorta. There are filling defects in segmental and subsegmental branches in right upper lobe, right middle lobe, right lower lobe, left upper lobe and left lower lobe. There is moderate thrombus burden. Mediastinum/Nodes: No significant lymphadenopathy seen. Lungs/Pleura: There are patchy infiltrates in parahilar regions and lower lung fields, more so in right lower lobe. There is small right pleural effusion. There is no pneumothorax. Upper Abdomen: There is moderate to large sized fixed hiatal hernia. Musculoskeletal: No acute findings are seen. Review of the MIP images confirms the above findings. IMPRESSION: Acute PE with moderate thrombus burden involving both lungs. RV-LV ratio is 1.3 suggesting acute or chronic right heart dysfunction. Small right pleural effusion. There are patchy infiltrates in parahilar regions and both lower lung fields, more so in right lower lobe suggesting atelectasis/pneumonia. Part of this finding could be due to developing pulmonary infarcts. Moderate to large sized fixed hiatal hernia. Imaging findings were relayed to patient's provider Dr. MMarjean Donnaby telephone call. Electronically Signed   By: PElmer PickerM.D.   On: 02/24/2022 17:13   DG Chest 2 View  Result Date: 02/24/2022 CLINICAL DATA:  Short of breath EXAM: CHEST - 2 VIEW COMPARISON:  None Available. FINDINGS: Heart size and vascularity normal. Negative for heart failure. Mild bibasilar airspace disease likely atelectasis. Small right pleural effusion. IMPRESSION: Mild bibasilar atelectasis.  Small right pleural effusion Electronically Signed   By: CFranchot GalloM.D.   On: 02/24/2022 16:05    Microbiology: Results for orders placed or performed during the hospital encounter of 02/24/22  Blood culture (routine x 2)     Status: None   Collection Time: 02/24/22  6:52 PM   Specimen: BLOOD   Result Value Ref Range Status   Specimen Description BLOOD BLOOD LEFT HAND  Final   Special Requests   Final    BOTTLES DRAWN AEROBIC AND ANAEROBIC Blood Culture adequate volume   Culture   Final    NO GROWTH 5 DAYS [facility] [address] [place] [address]   Report Status 03/01/2022 FINAL  Final  Blood culture (routine x 2)     Status: None   Collection Time: 02/24/22  6:58 PM   Specimen: BLOOD  Result Value Ref  Range Status   Specimen Description BLOOD RIGHT ANTECUBITAL  Final   Special Requests   Final    BOTTLES DRAWN AEROBIC AND ANAEROBIC Blood Culture adequate volume   Culture   Final    NO GROWTH 5 DAYS Performed at [facility], [address] [place], [place] [address]    Report Status 03/01/2022 FINAL  Final  MRSA Next Gen by PCR, Nasal     Status: None   Collection Time: 02/25/22  2:58 AM   Specimen: Nasal Mucosa; Nasal Swab  Result Value Ref Range Status   MRSA by PCR Next Gen NOT DETECTED NOT DETECTED Final    Comment: (NOTE) The GeneXpert MRSA Assay (FDA approved for NASAL specimens only), is one component of a comprehensive MRSA colonization surveillance program. It is not intended to diagnose MRSA infection nor to guide or monitor treatment for MRSA infections. Test performance is not FDA approved in patients less than 72 years old. Performed at [facility], [address] [place], [place] [address]   Culture, Respiratory w Gram Stain     Status: None   Collection Time: 03/01/22 11:21 AM   Specimen: Tracheal Aspirate; Respiratory  Result Value Ref Range Status   Specimen Description   Final    TRACHEAL ASPIRATE Performed at [facility], [address] [place], [place] [address]    Special Requests   Final    NONE Performed at [facility], [address] [place], [place] [address]    Gram Stain   Final    FEW WBC PRESENT, PREDOMINANTLY PMN NO ORGANISMS SEEN     Culture   Final    NO GROWTH 2 DAYS Performed at [facility] [address], [place], [place] [address]    Report Status 03/04/2022 FINAL  Final  Culture, blood (Routine X 2) w Reflex to ID Panel     Status: None   Collection Time: 03/03/22 12:41 PM   Specimen: BLOOD LEFT HAND  Result Value Ref Range Status   Specimen Description BLOOD LEFT HAND  Final   Special Requests   Final    BOTTLES DRAWN AEROBIC AND ANAEROBIC Blood Culture adequate volume   Culture   Final    NO GROWTH 5 DAYS Performed at [facility], [address] [place], [place] [address]    Report Status 03/08/2022 FINAL  Final  Culture, blood (Routine X 2) w Reflex to ID Panel     Status: None   Collection Time: 03/03/22 12:46 PM   Specimen: BLOOD  Result Value Ref Range Status   Specimen Description BLOOD RIGHT WRIST  Final   Special Requests   Final    BOTTLES DRAWN AEROBIC AND ANAEROBIC Blood Culture adequate volume   Culture   Final    NO GROWTH 5 DAYS Performed at [facility], [address], [place], [place] [address]    Report Status 03/08/2022 FINAL  Final  Culture, Respiratory w Gram Stain     Status: None   Collection Time: 03/03/22  9:00 PM   Specimen: Tracheal Aspirate; Respiratory  Result Value Ref Range Status   Specimen Description   Final    TRACHEAL ASPIRATE Performed at [facility], [address] [place], [place] [address]    Special Requests   Final    NONE Performed at [facility], [place], [place] [address]    Gram Stain NO WBC SEEN NO ORGANISMS SEEN  Final   Culture   Final    NO GROWTH 2 DAYS Performed at [facility], [place] [address], [place], [place] [address]    Report Status 03/06/2022 FINAL  Final  SARS Coronavirus 2 by RT PCR (hospital order, performed in [facility] hospital lab) *cepheid single result test* Anterior Nasal Swab     Status: Abnormal   Collection Time: 03/04/22 12:12 AM   Specimen:  Anterior Nasal Swab  Result Value Ref Range Status   SARS Coronavirus 2 by RT PCR POSITIVE (A) NEGATIVE Final    Comment: (NOTE) SARS-CoV-2 target nucleic acids are DETECTED  SARS-CoV-2 RNA is generally detectable in upper respiratory specimens  during the acute phase of infection.  Positive results are indicative  of the presence of the identified virus, but do not rule out bacterial infection or co-infection with other pathogens not detected by the test.  Clinical correlation with patient history and  other diagnostic information is necessary to determine patient infection status.  The expected result is negative.  Fact Sheet for Patients:   https://www.patel.info/   Fact Sheet for Healthcare Providers:   https://hall.com/    This test is not yet approved or cleared by the [place] FDA and  has been authorized for detection and/or diagnosis of SARS-CoV-2 by FDA under an Emergency Use Authorization (EUA).  This EUA will remain in effect (meaning this test can be used) for the duration of  the COVID-19 declaration under Section 564(b)(1)  of the Act, 21 U.S.C. section 360-bbb-3(b)(1), unless the authorization is terminated or revoked sooner.   Performed at [facility], [place], [place] [address]   C Difficile Quick Screen (NO PCR Reflex)     Status: None   Collection Time: 03/08/22  9:15 AM   Specimen: STOOL  Result Value Ref Range Status   C Diff antigen NEGATIVE NEGATIVE Final   C Diff toxin NEGATIVE NEGATIVE Final   C Diff interpretation No C. difficile detected.  Final    Comment: Performed at [facility], [address] [place], [place] [address]  Respiratory (~20 pathogens) panel by PCR     Status: None   Collection Time: 03/08/22  8:41 PM   Specimen: Tracheal Aspirate; Respiratory  Result Value Ref Range Status   Adenovirus NOT DETECTED NOT DETECTED Final   Coronavirus 229E NOT  DETECTED NOT DETECTED Final    Comment: (NOTE) The Coronavirus on the Respiratory Panel, DOES NOT test for the novel  Coronavirus (2019 nCoV)    Coronavirus HKU1 NOT DETECTED NOT DETECTED Final   Coronavirus NL63 NOT DETECTED NOT DETECTED Final   Coronavirus OC43 NOT DETECTED NOT DETECTED Final   Metapneumovirus NOT DETECTED NOT DETECTED Final   Rhinovirus / Enterovirus NOT DETECTED NOT DETECTED Final   Influenza A NOT DETECTED NOT DETECTED Final   Influenza B NOT DETECTED NOT DETECTED Final   Parainfluenza Virus 1 NOT DETECTED NOT DETECTED Final   Parainfluenza Virus 2 NOT DETECTED NOT DETECTED Final   Parainfluenza Virus 3 NOT DETECTED NOT DETECTED Final   Parainfluenza Virus 4 NOT DETECTED NOT DETECTED Final   Respiratory Syncytial Virus NOT DETECTED NOT DETECTED Final   Bordetella pertussis NOT DETECTED NOT DETECTED Final   Bordetella Parapertussis NOT DETECTED NOT DETECTED Final   Chlamydophila pneumoniae NOT DETECTED NOT DETECTED Final   Mycoplasma pneumoniae NOT DETECTED NOT DETECTED Final    Comment: Performed at [facility], [place]. [address], [place], [place] [address]  Culture, Respiratory w Gram Stain     Status: None   Collection Time: 03/08/22  8:42 PM   Specimen: Tracheal Aspirate; Respiratory  Result Value Ref Range Status   Specimen Description   Final    EXPECTORATED SPUTUM Performed at [facility], [address] [place], [place] [address]    Special Requests   Final    NONE Performed at [facility] [address] [place], [place] [address]    Gram Stain   Final    FEW WBC PRESENT,BOTH PMN AND MONONUCLEAR FEW GRAM POSITIVE COCCI IN PAIRS IN CLUSTERS FEW GRAM NEGATIVE RODS FEW GRAM POSITIVE RODS    Culture   Final    FEW Normal respiratory flora-no Staph aureus or Pseudomonas seen Performed at [facility], [address] [place], [place] [address]    Report Status 03/11/2022 FINAL  Final  MRSA Next Gen by PCR, Nasal      Status: None   Collection Time: 03/08/22  9:52 PM   Specimen: Nasal Mucosa; Nasal Swab  Result Value Ref Range Status   MRSA by PCR Next Gen NOT DETECTED NOT DETECTED Final    Comment: (NOTE) The GeneXpert MRSA Assay (FDA approved for NASAL specimens only), is one component of a comprehensive MRSA colonization surveillance program. It is not intended to diagnose MRSA infection nor to guide or monitor treatment for MRSA infections. Test performance is not FDA approved in patients less than 31 years old. Performed at [facility], [address] [place], [place] [address]   Culture, blood (Routine X 2) w Reflex to ID Panel     Status: None   Collection Time: 03/09/22 10:45 AM   Specimen: BLOOD  Result Value Ref Range Status   Specimen Description BLOOD BLOOD LEFT HAND  Final   Special Requests   Final    BOTTLES DRAWN AEROBIC ONLY Blood Culture results may not be optimal due to an inadequate volume of blood received in culture bottles   Culture   Final    NO GROWTH 5 DAYS Performed at [facility], [address] [place], [place] [address]    Report Status 03/14/2022 FINAL  Final  Culture, blood (Routine X 2) w Reflex to ID Panel     Status: None   Collection Time: 03/09/22 10:57 AM   Specimen: BLOOD  Result Value Ref Range Status   Specimen Description BLOOD BLOOD RIGHT HAND  Final   Special Requests   Final    BOTTLES DRAWN AEROBIC AND ANAEROBIC Blood Culture adequate volume   Culture   Final    NO GROWTH 5 DAYS Performed at [facility], [address] [place], [place] [address]    Report Status 03/14/2022 FINAL  Final  Culture, blood (Routine X 2) w Reflex to ID Panel     Status: None   Collection Time: 03/09/22  7:44 PM   Specimen: BLOOD  Result Value Ref Range Status   Specimen Description BLOOD BLOOD RIGHT HAND  Final   Special Requests   Final    BOTTLES DRAWN AEROBIC AND ANAEROBIC Blood Culture adequate volume   Culture   Final     NO GROWTH 5 DAYS Performed at [facility], [address], [place], [place] [address]    Report Status 03/14/2022 FINAL  Final  Culture, blood (Routine X 2) w Reflex to ID Panel     Status: None   Collection Time: 03/09/22  7:47 PM  Specimen: BLOOD  Result Value Ref Range Status   Specimen Description BLOOD BLOOD LEFT HAND  Final   Special Requests   Final    BOTTLES DRAWN AEROBIC AND ANAEROBIC Blood Culture adequate volume   Culture   Final    NO GROWTH 5 DAYS Performed at [facility], [address] [place], [place] [address]    Report Status 03/14/2022 FINAL  Final  Culture, blood (Routine X 2) w Reflex to ID Panel     Status: None (Preliminary result)   Collection Time: 03/19/22  9:41 AM   Specimen: BLOOD RIGHT HAND  Result Value Ref Range Status   Specimen Description BLOOD RIGHT HAND  Final   Special Requests   Final    BOTTLES DRAWN AEROBIC AND ANAEROBIC Blood Culture adequate volume   Culture   Final    NO GROWTH < 24 HOURS Performed at [facility], [address], [place], [place] [address]    Report Status PENDING  Incomplete  Culture, blood (Routine X 2) w Reflex to ID Panel     Status: None (Preliminary result)   Collection Time: 03/19/22 10:35 AM   Specimen: BLOOD RIGHT HAND  Result Value Ref Range Status   Specimen Description BLOOD RIGHT HAND  Final   Special Requests   Final    BOTTLES DRAWN AEROBIC AND ANAEROBIC Blood Culture adequate volume   Culture   Final    NO GROWTH < 24 HOURS Performed at [facility], [address], [place], [place] [address]    Report Status PENDING  Incomplete  MRSA Next Gen by PCR, Nasal     Status: None   Collection Time: 03/19/22 12:45 PM  Result Value Ref Range Status   MRSA by PCR Next Gen NOT DETECTED NOT DETECTED Final    Comment: (NOTE) The GeneXpert MRSA Assay (FDA approved for NASAL specimens only), is one component of a comprehensive MRSA colonization  surveillance program. It is not intended to diagnose MRSA infection nor to guide or monitor treatment for MRSA infections. Test performance is not FDA approved in patients less than 44 years old. Performed at [facility], [address] [place],  [address]     Labs: CBC: Recent Labs  Lab 03/16/22 0324 03/17/22 0645 03/18/22 0551 03/19/22 0350 03/20/22 0445  WBC 22.0* 17.7* 17.5* 26.3* 20.9*  NEUTROABS  --   --   --  19.8* 16.1*  HGB 8.6* 9.1* 8.5* 9.7* 8.9*  HCT 28.7* 30.4* 28.8* 33.1* 29.8*  MCV 93.5 95.0 97.0 95.7 95.2  PLT 359 363 375 392 638   Basic Metabolic Panel: Recent Labs  Lab 03/14/22 0504 03/17/22 0645 03/18/22 0551 03/19/22 0943 03/20/22 0438 03/20/22 0445  NA 137 143 144 142  --  138  K 4.4 3.9 3.7 3.8  --  3.3*  CL 99 105 108 107  --  106  CO2 _0 --  26  GLUCOSE 145* 132* 125* 152*  --  107*  BUN 27* 27* 33* 31*  --  19  CREATININE 0.39* 0.34* 0.30* 0.44  --  0.32*  CALCIUM 8.8* 10.0 10.2 10.1  --  9.0  MG  --  2.4  --   --  2.0  --   PHOS 3.2 4.2  --   --  3.0  --    Liver Function Tests: Recent Labs  Lab 03/19/22 0943  AST 29  ALT 68*  ALKPHOS 171*  BILITOT 0.5  PROT 7.2  ALBUMIN 3.4*   CBG: Recent Labs  Lab 03/19/22 1925 03/19/22 2321 03/20/22 0349 03/20/22 0729 03/20/22 1227  GLUCAP 113* 112* 95 120* 117*    Discharge time spent: greater than 30 minutes.  Signed: Jennye Boroughs, MD [facility] 03/20/2022

## 2022-03-20 NOTE — Progress Notes (Addendum)
SLP Cancellation Note  Patient Details Name: [name] MRN: [id number] DOB: 1960/11/23   Cancelled treatment:       Reason Eval/Treat Not Completed:  (chart reviewed) Per chart notes, there was attempted transfer out of CCU yesterday PM, but pt was transferred back to CCU d/t elevated HR, VS, WBC, and MEWS score. Similar VS, WBC noted this AM. Recommend continue frequent oral care for hygiene and stim of swallowing. Pt has NGT for nutrition currently. ST services will f/u next 1-2 days w/ ongoing assessment of swallowing as appropriate.     Jerilynn Som, MS, CCC-SLP Speech Language Pathologist Rehab Services; [facility] [phone] (ascom) [name] 03/20/2022, 8:21 AM

## 2022-03-20 NOTE — Progress Notes (Addendum)
Physical Therapy Treatment Patient Details Name: [name] MRN: [id number] DOB: 02-09-1961 Today's Date: 03/20/2022   History of Present Illness 61 year old female with history of MS, hypertension, depression, anxiety, restless leg syndrome, GERD, who presented to emergency department for chief concerns of shortness of breath. Pt found to be positive for covid with b/l PE and DVT noted.  Now s/p thrombectomy and multiple bronchoscopy procedures. unable to wean from the ventilator. MRI imaging of the brain showed chronic cerebral and cerebellar micro-hemorrhages.    PT Comments    Pt seen for PT tx with OT joining for co-tx for pt & therapists safety. Pt received in bed, requiring encouragement to speak at audible volume - pt attempts but it requires effort. Pt noted to be incontinent of BM & requires max assist +2 for rolling L<>R. Pt assisted to sitting EOB with total assist +2 for supine<>sit. Pt requires max assist for static sitting with BLE barely touching floor (2/2 height of bed). Pt with absent righting reactions with anterior/posterior LOB but when pt does achieve midline pt is able to maintain for ~3-5 seconds. Pt appreciative of therapists' efforts. Will continue to follow pt acutely to address balance, strengthening, and bed mobility.  HR 119-139 bpm during session. PT goals reviewed & POC updated as appropriate. No active movement in BLE when asked to perform LAQ sitting EOB but able to hold BLE up briefly when OT positioned either LE in somewhat of a LAQ.    Recommendations for follow up therapy are one component of a multi-disciplinary discharge planning process, led by the attending physician.  Recommendations may be updated based on patient status, additional functional criteria and insurance authorization.  Follow Up Recommendations  Long-term institutional care without follow-up therapy Can patient physically be transported by private vehicle: No   Assistance  Recommended at Discharge Frequent or constant Supervision/Assistance  Patient can return home with the following Two people to help with walking and/or transfers;Two people to help with bathing/dressing/bathroom;Assistance with cooking/housework;Assistance with feeding;Help with stairs or ramp for entrance;Direct supervision/assist for medications management;Assist for transportation;Direct supervision/assist for financial management   Equipment Recommendations  None recommended by PT (TBD in next venue)    Recommendations for Other Services       Precautions / Restrictions Precautions Precautions: Fall Precaution Comments: NG tube Restrictions Weight Bearing Restrictions: No     Mobility  Bed Mobility Overal bed mobility: Needs Assistance Bed Mobility: Rolling, Supine to Sit, Sit to Supine Rolling: Max assist, +2 for physical assistance   Supine to sit: +2 for physical assistance, Total assist Sit to supine: +2 for physical assistance, Total assist        Transfers                        Ambulation/Gait                   Stairs             Wheelchair Mobility    Modified Rankin (Stroke Patients Only)       Balance Overall balance assessment: Needs assistance Sitting-balance support: No upper extremity supported, Feet supported Sitting balance-Leahy Scale: Poor Sitting balance - Comments: posterior/anterior LOB, absent righting reactions                                    Cognition Arousal/Alertness: Awake/alert Behavior During Therapy: WFL for tasks assessed/performed  Overall Cognitive Status: Impaired/Different from baseline Area of Impairment: Safety/judgement, Attention, Memory, Following commands, Awareness, Problem solving                     Memory: Decreased recall of precautions, Decreased short-term memory Following Commands: Follows one step commands inconsistently, Follows multi-step commands with  increased time Safety/Judgement: Decreased awareness of safety, Decreased awareness of deficits Awareness: Intellectual Problem Solving: Decreased initiation, Difficulty sequencing, Requires verbal cues, Requires tactile cues, Slow processing          Exercises      General Comments General comments (skin integrity, edema, etc.): HR 110s-130s in supine and sitting      Pertinent Vitals/Pain Pain Assessment Pain Assessment: Faces Faces Pain Scale: Hurts little more Pain Location: back Pain Descriptors / Indicators: Discomfort, Grimacing Pain Intervention(s): Monitored during session, Repositioned    Home Living                          Prior Function            PT Goals (current goals can now be found in the care plan section) Acute Rehab PT Goals Patient Stated Goal: unable to state PT Goal Formulation: Patient unable to participate in goal setting Time For Goal Achievement: 03/27/22 Potential to Achieve Goals: Fair Progress towards PT goals: Progressing toward goals    Frequency    Min 2X/week      PT Plan Current plan remains appropriate    Co-evaluation PT/OT/SLP Co-Evaluation/Treatment: Yes Reason for Co-Treatment: Complexity of the patient's impairments (multi-system involvement);Necessary to address cognition/behavior during functional activity;For patient/therapist safety PT goals addressed during session: Mobility/safety with mobility;Balance;Strengthening/ROM OT goals addressed during session: ADL's and self-care      AM-PAC PT "6 Clicks" Mobility   Outcome Measure  Help needed turning from your back to your side while in a flat bed without using bedrails?: Total Help needed moving from lying on your back to sitting on the side of a flat bed without using bedrails?: Total Help needed moving to and from a bed to a chair (including a wheelchair)?: Total Help needed standing up from a chair using your arms (e.g., wheelchair or bedside  chair)?: Total Help needed to walk in hospital room?: Total Help needed climbing 3-5 steps with a railing? : Total 6 Click Score: 6    End of Session   Activity Tolerance: Patient tolerated treatment well Patient left: in bed;with call bell/phone within reach;with bed alarm set;with family/visitor present Nurse Communication: Mobility status (HR) PT Visit Diagnosis: Muscle weakness (generalized) (M62.81);Other symptoms and signs involving the nervous system (R29.898);Other abnormalities of gait and mobility (R26.89)     Time: 1740-8144 PT Time Calculation (min) (ACUTE ONLY): 24 min  Charges:  $Therapeutic Activity: 8-22 mins                     Aleda Grana, PT, DPT 03/20/22, 4:45 PM   Sandi Mariscal 03/20/2022, 4:37 PM

## 2022-03-20 NOTE — Progress Notes (Signed)
Pharmacy Antibiotic Note  [name] is a 61 y.o. female w/ PMH of RLS, MS, HTN, depression, tachycardia admitted on 02/24/2022 with PE, DVT, Cerebral microhemorrhages now with fever and worsening leucocytosis.  Pharmacy has been consulted for cefepime dosing.  Plan: Continue cefepime 2 grams IV every 8 hours   Height: 5\' 2"  (157.5 cm) Weight: 63.8 kg (140 lb 10.5 oz) IBW/kg (Calculated) : 50.1  Temp (24hrs), Avg:98.5 F (36.9 C), Min:97.9 F (36.6 C), Max:99.4 F (37.4 C)  Recent Labs  Lab 03/14/22 0504 03/15/22 0335 03/16/22 0324 03/17/22 0645 03/18/22 0551 03/19/22 0350 03/19/22 0943 03/20/22 0445  WBC 26.2*   < > 22.0* 17.7* 17.5* 26.3*  --  20.9*  CREATININE 0.39*  --   --  0.34* 0.30*  --  0.44 0.32*  LATICACIDVEN  --   --   --   --   --   --  1.7 1.4   < > = values in this interval not displayed.     Estimated Creatinine Clearance: 67.3 mL/min (A) (by C-G formula based on SCr of 0.32 mg/dL (L)).    Allergies  Allergen Reactions   Erythromycin Hives and Nausea And Vomiting   Lexapro [Escitalopram Oxalate] Hives    Antimicrobials this admission: Unasyn 11/15 >> 11/19 Zosyn 11/20 >> 11/26 Linezolid 11/26 >> 12/1 Meropenem 11/26 >> 12/1 Vancomycin 12/07 >> 12/08 cefepime 12/07 >>   Cultures this admission 11/14 Bcx: NG 11/15 MRSA PCR: (-) 11/19 Tracheal aspirate: NG 11/21 Tracheal aspirate: NG 11/21 Bcx: NG 11/22 SARS-CoV-2: (+) 11/26 C diff: (-) 11/26 MRSA PCR: (-) 11/26 RVP: (-) 11/26: Tracheal aspirate: NF 11/27 Bcx: NG  Thank you for allowing pharmacy to be a part of this patient's care.  12/27 03/20/2022 2:48 PM

## 2022-03-20 NOTE — Progress Notes (Signed)
Occupational Therapy Treatment Patient Details Name: [name] MRN: [id number] DOB: 1960/05/16 Today's Date: 03/20/2022   History of present illness 61 year old female with history of [name], hypertension, depression, anxiety, restless leg syndrome, GERD, who presented to emergency department for chief concerns of shortness of breath. Pt found to be positive for covid with b/l PE and DVT noted.  Now s/p thrombectomy and multiple bronchoscopy procedures. unable to wean from the ventilator. MRI imaging of the brain showed chronic cerebral and cerebellar micro-hemorrhages.   OT comments  [name] was seen for OT treatment on this date. Upon arrival to room pt sidelying with PT, agreeable to tx. Pt requires MAX A x2 pericare bed level. TOTAL A sup<>sit, initial TOTAL for sitting balance improves to MAX A. MAX A x2 don gown seated EOB, +2 for sitting balance. Pt making good progress toward goals, will continue to follow POC. Discharge recommendation remains appropriate.     Recommendations for follow up therapy are one component of a multi-disciplinary discharge planning process, led by the attending physician.  Recommendations may be updated based on patient status, additional functional criteria and insurance authorization.    Follow Up Recommendations  OT at Long-term acute care hospital     Assistance Recommended at Discharge Frequent or constant Supervision/Assistance  Patient can return home with the following  Two people to help with walking and/or transfers;Two people to help with bathing/dressing/bathroom   Equipment Recommendations  Other (comment) (defer)    Recommendations for Other Services      Precautions / Restrictions Precautions Precautions: Fall Restrictions Weight Bearing Restrictions: No       Mobility Bed Mobility Overal bed mobility: Needs Assistance Bed Mobility: Rolling, Supine to Sit, Sit to Supine Rolling: Max assist, +2 for physical  assistance   Supine to sit: +2 for physical assistance, Total assist Sit to supine: +2 for physical assistance, Total assist        Transfers                         Balance Overall balance assessment: Needs assistance Sitting-balance support: No upper extremity supported, Feet supported Sitting balance-Leahy Scale: Poor                                     ADL either performed or assessed with clinical judgement   ADL Overall ADL's : Needs assistance/impaired                                       General ADL Comments: MAX A x2 pericare bed level. MAX A don gown seated EOB, +2 for sitting balance      Cognition Arousal/Alertness: Awake/alert Behavior During Therapy: WFL for tasks assessed/performed Overall Cognitive Status: Impaired/Different from baseline Area of Impairment: Safety/judgement                         Safety/Judgement: Decreased awareness of safety, Decreased awareness of deficits                    General Comments HR 110s-130s in supine and sitting    Pertinent Vitals/ Pain       Pain Assessment Pain Assessment: Faces Faces Pain Scale: Hurts little more Pain Descriptors / Indicators: Grimacing Pain Intervention(s): Limited activity within  patient's tolerance, Repositioned   Frequency  Min 2X/week        Progress Toward Goals  OT Goals(current goals can now be found in the care plan section)  Progress towards OT goals: Progressing toward goals  Acute Rehab OT Goals Patient Stated Goal: to get stronger OT Goal Formulation: With family Time For Goal Achievement: 03/25/22 Potential to Achieve Goals: Fair ADL Goals Pt Will Perform Grooming: with mod assist;sitting Pt Will Transfer to Toilet: with mod assist;stand pivot transfer Pt Will Perform Toileting - Clothing Manipulation and hygiene: with mod assist;sit to/from stand Pt/caregiver will Perform Home Exercise Program: With  written HEP provided;With Supervision;Both right and left upper extremity  Plan Discharge plan remains appropriate;Frequency remains appropriate    Co-evaluation    PT/OT/SLP Co-Evaluation/Treatment: Yes Reason for Co-Treatment: Complexity of the patient's impairments (multi-system involvement);To address functional/ADL transfers PT goals addressed during session: Mobility/safety with mobility OT goals addressed during session: ADL's and self-care      AM-PAC OT "6 Clicks" Daily Activity     Outcome Measure   Help from another person eating meals?: Total Help from another person taking care of personal grooming?: A Lot Help from another person toileting, which includes using toliet, bedpan, or urinal?: Total Help from another person bathing (including washing, rinsing, drying)?: A Lot Help from another person to put on and taking off regular upper body clothing?: A Lot Help from another person to put on and taking off regular lower body clothing?: Total 6 Click Score: 9    End of Session    OT Visit Diagnosis: Muscle weakness (generalized) (M62.81);Unsteadiness on feet (R26.81) Pain - Right/Left: Right   Activity Tolerance Patient tolerated treatment well   Patient Left in bed;with call bell/phone within reach;with bed alarm set;with nursing/sitter in room;with family/visitor present   Nurse Communication          Time: 9924-2683 OT Time Calculation (min): 20 min  Charges: OT General Charges $OT Visit: 1 Visit OT Treatments $Self Care/Home Management : 8-22 mins  Kathie Dike, M.S. OTR/L  03/20/22, 4:33 PM  ascom [phone]

## 2022-03-21 NOTE — Progress Notes (Signed)
  Progress Note   Date: 03/17/2022  Patient Name: [name]        MRN#: [id number]    Severe sepsis was not present on admission (developed during hospital admission)     Zada Girt, [facility]  Pulmonary/Critical Care Pager [phone] (please enter 7 digits) PCCM Consult Pager [phone] (please enter 7 digits)

## 2022-03-22 LAB — CALR +MPL + E12-E15  (REFLEX)

## 2022-03-22 LAB — JAK2 V617F RFX CALR/MPL/E12-15

## 2022-03-23 ENCOUNTER — Telehealth: Payer: Self-pay

## 2022-03-23 LAB — NOROVIRUS GROUP 1 & 2 BY PCR, STOOL

## 2022-03-23 NOTE — Telephone Encounter (Signed)
Please schedule pt for hospital f/u (MD only) and inform pt of appt.

## 2022-03-23 NOTE — Telephone Encounter (Signed)
-----   Message from Rickard Patience, MD sent at 03/22/2022 10:18 PM EST ----- She was seen by me during her admission. Please arrange her to do a MD follow up  thanks.

## 2022-03-25 LAB — CULTURE, BLOOD (ROUTINE X 2)
Culture: NO GROWTH
Culture: NO GROWTH
Special Requests: ADEQUATE
Special Requests: ADEQUATE

## 2022-03-26 ENCOUNTER — Ambulatory Visit: Payer: Medicare Other | Admitting: Dermatology

## 2022-03-31 ENCOUNTER — Inpatient Hospital Stay: Payer: Medicare Other | Attending: Oncology | Admitting: Oncology

## 2022-04-16 ENCOUNTER — Emergency Department: Payer: Medicare Other

## 2022-04-16 ENCOUNTER — Inpatient Hospital Stay
Admission: EM | Admit: 2022-04-16 | Discharge: 2022-04-21 | DRG: 871 | Disposition: A | Discharge status: Home Health | Payer: Medicare Other | Attending: Internal Medicine | Admitting: Internal Medicine

## 2022-04-16 ENCOUNTER — Other Ambulatory Visit: Payer: Self-pay

## 2022-04-16 DIAGNOSIS — K5909 Other constipation: Secondary | ICD-10-CM | POA: Diagnosis present

## 2022-04-16 DIAGNOSIS — G2581 Restless legs syndrome: Secondary | ICD-10-CM | POA: Diagnosis present

## 2022-04-16 DIAGNOSIS — R000 Tachycardia, unspecified: Secondary | ICD-10-CM | POA: Diagnosis not present

## 2022-04-16 DIAGNOSIS — G35 Multiple sclerosis: Secondary | ICD-10-CM | POA: Diagnosis not present

## 2022-04-16 DIAGNOSIS — J9601 Acute respiratory failure with hypoxia: Secondary | ICD-10-CM | POA: Diagnosis present

## 2022-04-16 DIAGNOSIS — Z993 Dependence on wheelchair: Secondary | ICD-10-CM

## 2022-04-16 DIAGNOSIS — Z8616 Personal history of COVID-19: Secondary | ICD-10-CM | POA: Diagnosis not present

## 2022-04-16 DIAGNOSIS — D638 Anemia in other chronic diseases classified elsewhere: Secondary | ICD-10-CM | POA: Diagnosis present

## 2022-04-16 DIAGNOSIS — N390 Urinary tract infection, site not specified: Secondary | ICD-10-CM | POA: Diagnosis not present

## 2022-04-16 DIAGNOSIS — J189 Pneumonia, unspecified organism: Secondary | ICD-10-CM | POA: Diagnosis not present

## 2022-04-16 DIAGNOSIS — Z79899 Other long term (current) drug therapy: Secondary | ICD-10-CM

## 2022-04-16 DIAGNOSIS — K219 Gastro-esophageal reflux disease without esophagitis: Secondary | ICD-10-CM | POA: Diagnosis present

## 2022-04-16 DIAGNOSIS — R32 Unspecified urinary incontinence: Secondary | ICD-10-CM | POA: Diagnosis present

## 2022-04-16 DIAGNOSIS — R6510 Systemic inflammatory response syndrome (SIRS) of non-infectious origin without acute organ dysfunction: Secondary | ICD-10-CM | POA: Diagnosis present

## 2022-04-16 DIAGNOSIS — L03312 Cellulitis of back [any part except buttock]: Secondary | ICD-10-CM | POA: Diagnosis present

## 2022-04-16 DIAGNOSIS — Z1152 Encounter for screening for COVID-19: Secondary | ICD-10-CM

## 2022-04-16 DIAGNOSIS — Z86718 Personal history of other venous thrombosis and embolism: Secondary | ICD-10-CM

## 2022-04-16 DIAGNOSIS — G8220 Paraplegia, unspecified: Secondary | ICD-10-CM | POA: Diagnosis present

## 2022-04-16 DIAGNOSIS — Z881 Allergy status to other antibiotic agents status: Secondary | ICD-10-CM | POA: Diagnosis not present

## 2022-04-16 DIAGNOSIS — T17908A Unspecified foreign body in respiratory tract, part unspecified causing other injury, initial encounter: Secondary | ICD-10-CM | POA: Diagnosis not present

## 2022-04-16 DIAGNOSIS — R6520 Severe sepsis without septic shock: Secondary | ICD-10-CM | POA: Diagnosis present

## 2022-04-16 DIAGNOSIS — A419 Sepsis, unspecified organism: Secondary | ICD-10-CM | POA: Diagnosis not present

## 2022-04-16 DIAGNOSIS — F32A Depression, unspecified: Secondary | ICD-10-CM | POA: Diagnosis present

## 2022-04-16 DIAGNOSIS — L89320 Pressure ulcer of left buttock, unstageable: Secondary | ICD-10-CM | POA: Diagnosis present

## 2022-04-16 DIAGNOSIS — I10 Essential (primary) hypertension: Secondary | ICD-10-CM | POA: Diagnosis present

## 2022-04-16 DIAGNOSIS — Z86711 Personal history of pulmonary embolism: Secondary | ICD-10-CM

## 2022-04-16 DIAGNOSIS — Z7901 Long term (current) use of anticoagulants: Secondary | ICD-10-CM

## 2022-04-16 DIAGNOSIS — I2699 Other pulmonary embolism without acute cor pulmonale: Secondary | ICD-10-CM | POA: Diagnosis present

## 2022-04-16 DIAGNOSIS — L89150 Pressure ulcer of sacral region, unstageable: Secondary | ICD-10-CM | POA: Diagnosis present

## 2022-04-16 DIAGNOSIS — L03319 Cellulitis of trunk, unspecified: Secondary | ICD-10-CM | POA: Diagnosis present

## 2022-04-16 DIAGNOSIS — R509 Fever, unspecified: Principal | ICD-10-CM

## 2022-04-16 DIAGNOSIS — Z888 Allergy status to other drugs, medicaments and biological substances status: Secondary | ICD-10-CM

## 2022-04-16 DIAGNOSIS — I959 Hypotension, unspecified: Secondary | ICD-10-CM | POA: Diagnosis present

## 2022-04-16 DIAGNOSIS — F419 Anxiety disorder, unspecified: Secondary | ICD-10-CM | POA: Diagnosis present

## 2022-04-16 DIAGNOSIS — E876 Hypokalemia: Secondary | ICD-10-CM | POA: Diagnosis not present

## 2022-04-16 DIAGNOSIS — L89310 Pressure ulcer of right buttock, unstageable: Secondary | ICD-10-CM | POA: Diagnosis present

## 2022-04-16 DIAGNOSIS — L89154 Pressure ulcer of sacral region, stage 4: Secondary | ICD-10-CM | POA: Diagnosis present

## 2022-04-16 HISTORY — DX: Family history of malignant neoplasm of digestive organs: Z80.0

## 2022-04-16 HISTORY — DX: Tachycardia, unspecified: R00.0

## 2022-04-16 HISTORY — DX: Pneumonia, unspecified organism: J18.9

## 2022-04-16 HISTORY — DX: Diaphragmatic hernia without obstruction or gangrene: K44.9

## 2022-04-16 HISTORY — DX: Restless legs syndrome: G25.81

## 2022-04-16 HISTORY — DX: Repeated falls: R29.6

## 2022-04-16 HISTORY — DX: Other constipation: K59.09

## 2022-04-16 HISTORY — DX: Other pulmonary embolism without acute cor pulmonale: I26.99

## 2022-04-16 HISTORY — DX: Hemorrhage from other sites in respiratory passages: R04.89

## 2022-04-16 HISTORY — DX: Sepsis, unspecified organism: A41.9

## 2022-04-16 LAB — URINALYSIS, ROUTINE W REFLEX MICROSCOPIC
Bacteria, UA: NONE SEEN
Bilirubin Urine: NEGATIVE
Glucose, UA: NEGATIVE mg/dL
Ketones, ur: 5 mg/dL — AB
Nitrite: POSITIVE — AB
Protein, ur: NEGATIVE mg/dL
Specific Gravity, Urine: 1.04 — ABNORMAL HIGH (ref 1.005–1.030)
WBC, UA: >50 WBC/hpf — ABNORMAL HIGH (ref 0–5)
pH: 7 (ref 5.0–8.0)

## 2022-04-16 LAB — COMPREHENSIVE METABOLIC PANEL
ALT: 23 U/L (ref 0–44)
AST: 30 U/L (ref 15–41)
Albumin: 3.1 g/dL — ABNORMAL LOW (ref 3.5–5.0)
Alkaline Phosphatase: 184 U/L — ABNORMAL HIGH (ref 38–126)
Anion gap: 15 (ref 5–15)
BUN: 10 mg/dL (ref 6–20)
CO2: 22 mmol/L (ref 22–32)
Calcium: 9.1 mg/dL (ref 8.9–10.3)
Chloride: 97 mmol/L — ABNORMAL LOW (ref 98–111)
Creatinine, Ser: 0.5 mg/dL (ref 0.44–1.00)
GFR, Estimated: >60 mL/min (ref >60)
Glucose, Bld: 92 mg/dL (ref 70–99)
Potassium: 4.5 mmol/L (ref 3.5–5.1)
Sodium: 134 mmol/L — ABNORMAL LOW (ref 135–145)
Total Bilirubin: 0.7 mg/dL (ref 0.3–1.2)
Total Protein: 7.2 g/dL (ref 6.5–8.1)

## 2022-04-16 LAB — CBC
HCT: 35.4 % — ABNORMAL LOW (ref 36.0–46.0)
Hemoglobin: 10.5 g/dL — ABNORMAL LOW (ref 12.0–15.0)
MCH: 27.1 pg (ref 26.0–34.0)
MCHC: 29.7 g/dL — ABNORMAL LOW (ref 30.0–36.0)
MCV: 91.5 fL (ref 80.0–100.0)
Platelets: 374 10*3/uL (ref 150–400)
RBC: 3.87 MIL/uL (ref 3.87–5.11)
RDW: 18.2 % — ABNORMAL HIGH (ref 11.5–15.5)
WBC: 29.1 10*3/uL — ABNORMAL HIGH (ref 4.0–10.5)
nRBC: 0 % (ref 0.0–0.2)

## 2022-04-16 LAB — RESP PANEL BY RT-PCR (RSV, FLU A&B, COVID)  RVPGX2
Influenza A by PCR: NEGATIVE
Influenza B by PCR: NEGATIVE
Resp Syncytial Virus by PCR: NEGATIVE
SARS Coronavirus 2 by RT PCR: NEGATIVE

## 2022-04-16 LAB — LACTIC ACID, PLASMA
Lactic Acid, Venous: 3.3 mmol/L (ref 0.5–1.9)
Lactic Acid, Venous: 2.6 mmol/L (ref 0.5–1.9)

## 2022-04-16 LAB — PROCALCITONIN: Procalcitonin: 0.58 ng/mL

## 2022-04-16 MED ORDER — POLYETHYLENE GLYCOL 3350 17 G PO PACK
17.0000 g | PACK | Freq: Every day | ORAL | Status: DC
  Administered 2022-04-17 – 2022-04-21 (×3): 17 g via ORAL
  Filled 2022-04-16 (×5): qty 1

## 2022-04-16 MED ORDER — SODIUM CHLORIDE 0.9 % IV BOLUS
1000.0000 mL | Freq: Once | INTRAVENOUS | Status: AC
  Administered 2022-04-16: 1000 mL via INTRAVENOUS

## 2022-04-16 MED ORDER — PROPRANOLOL HCL 20 MG PO TABS
20.0000 mg | ORAL_TABLET | Freq: Two times a day (BID) | ORAL | Status: DC
  Administered 2022-04-16 – 2022-04-18 (×4): 20 mg via ORAL
  Filled 2022-04-16 (×4): qty 1

## 2022-04-16 MED ORDER — SENNOSIDES-DOCUSATE SODIUM 8.6-50 MG PO TABS
1.0000 | ORAL_TABLET | Freq: Every day | ORAL | Status: DC
  Administered 2022-04-16 – 2022-04-21 (×5): 1 via ORAL
  Filled 2022-04-16 (×6): qty 1

## 2022-04-16 MED ORDER — METRONIDAZOLE 500 MG/100ML IV SOLN
500.0000 mg | Freq: Two times a day (BID) | INTRAVENOUS | Status: DC
  Administered 2022-04-16 – 2022-04-19 (×6): 500 mg via INTRAVENOUS
  Filled 2022-04-16 (×7): qty 100

## 2022-04-16 MED ORDER — PREGABALIN 75 MG PO CAPS
75.0000 mg | ORAL_CAPSULE | Freq: Three times a day (TID) | ORAL | Status: DC
  Administered 2022-04-17 – 2022-04-21 (×13): 75 mg via ORAL
  Filled 2022-04-16 (×14): qty 1

## 2022-04-16 MED ORDER — VANCOMYCIN HCL 1250 MG/250ML IV SOLN
1250.0000 mg | Freq: Once | INTRAVENOUS | Status: AC
  Administered 2022-04-16: 1250 mg via INTRAVENOUS
  Filled 2022-04-16: qty 250

## 2022-04-16 MED ORDER — ACETAMINOPHEN 325 MG PO TABS
650.0000 mg | ORAL_TABLET | Freq: Four times a day (QID) | ORAL | Status: DC | PRN
  Administered 2022-04-17 – 2022-04-20 (×5): 650 mg via ORAL
  Filled 2022-04-16 (×6): qty 2

## 2022-04-16 MED ORDER — ACETAMINOPHEN 650 MG RE SUPP: 650.0000 mg | Freq: Four times a day (QID) | RECTAL | Status: DC | PRN

## 2022-04-16 MED ORDER — VANCOMYCIN HCL 1250 MG/250ML IV SOLN
1250.0000 mg | INTRAVENOUS | Status: DC
  Administered 2022-04-17 – 2022-04-20 (×4): 1250 mg via INTRAVENOUS
  Filled 2022-04-16 (×6): qty 250

## 2022-04-16 MED ORDER — RIVAROXABAN 20 MG PO TABS
20.0000 mg | ORAL_TABLET | Freq: Every day | ORAL | Status: DC
  Administered 2022-04-16 – 2022-04-20 (×5): 20 mg via ORAL
  Filled 2022-04-16 (×6): qty 1

## 2022-04-16 MED ORDER — GABAPENTIN 400 MG PO CAPS
400.0000 mg | ORAL_CAPSULE | Freq: Two times a day (BID) | ORAL | Status: DC
  Administered 2022-04-16 – 2022-04-21 (×10): 400 mg via ORAL
  Filled 2022-04-16 (×10): qty 1

## 2022-04-16 MED ORDER — IOHEXOL 300 MG/ML  SOLN
100.0000 mL | Freq: Once | INTRAMUSCULAR | Status: AC | PRN
  Administered 2022-04-16: 100 mL via INTRAVENOUS

## 2022-04-16 MED ORDER — SODIUM CHLORIDE 0.9 % IV SOLN
2.0000 g | Freq: Three times a day (TID) | INTRAVENOUS | Status: DC
  Administered 2022-04-16 – 2022-04-18 (×5): 2 g via INTRAVENOUS
  Filled 2022-04-16 (×6): qty 12.5

## 2022-04-16 MED ORDER — ONDANSETRON HCL 4 MG PO TABS: 4.0000 mg | ORAL_TABLET | Freq: Four times a day (QID) | ORAL | Status: DC | PRN

## 2022-04-16 MED ORDER — DULOXETINE HCL 30 MG PO CPEP
30.0000 mg | ORAL_CAPSULE | Freq: Every day | ORAL | Status: DC
  Administered 2022-04-17 – 2022-04-21 (×5): 30 mg via ORAL
  Filled 2022-04-16 (×5): qty 1

## 2022-04-16 MED ORDER — DALFAMPRIDINE ER 10 MG PO TB12
10.0000 mg | ORAL_TABLET | Freq: Two times a day (BID) | ORAL | Status: DC
  Administered 2022-04-18 – 2022-04-21 (×6): 10 mg via ORAL
  Filled 2022-04-16 (×2): qty 1

## 2022-04-16 MED ORDER — SODIUM CHLORIDE 0.9 % IV SOLN
1.0000 g | Freq: Three times a day (TID) | INTRAVENOUS | Status: DC
  Administered 2022-04-16: 1 g via INTRAVENOUS
  Filled 2022-04-16: qty 10

## 2022-04-16 MED ORDER — ONDANSETRON HCL 4 MG/2ML IJ SOLN
4.0000 mg | Freq: Four times a day (QID) | INTRAMUSCULAR | Status: DC | PRN
  Administered 2022-04-19: 4 mg via INTRAVENOUS
  Filled 2022-04-16: qty 2

## 2022-04-16 MED ORDER — SODIUM CHLORIDE 0.9% FLUSH
3.0000 mL | Freq: Two times a day (BID) | INTRAVENOUS | Status: DC
  Administered 2022-04-16 – 2022-04-21 (×7): 3 mL via INTRAVENOUS

## 2022-04-16 MED ORDER — LACTATED RINGERS IV SOLN: INTRAVENOUS | Status: DC

## 2022-04-16 NOTE — H&P (Addendum)
History and Physical    Patient: [name] UVO:[id number] DOB: [birth year] DOA: 04/16/2022 DOS: the patient was seen and examined on 04/16/2022 PCP: [facility]  Patient coming from: Home  Chief Complaint: Fever  HPI: [name] is a 62 y.o. female with medical history significant of recent hospitalization for acute PE s/p PE thrombectomy complicated by sepsis 2/2 COVID-19 pneumonia requiring intubation, multiple sclerosis, restless leg syndrome, who presents to the ED due to fever.  Ms. Zahn states that over the last several days, she has been feeling generally weak, fatigued, and chills.  She denies any fever, shortness of breath, chest pain, nausea, vomiting, diarrhea, abdominal pain, dysuria.  She notes that she is incontinent of urine and does not have intact sensation of when she is urinating due to MS.  She notes that the newest development has been a wound on her bottom for which she went to her physician today.  Her significant other has been covering it with Desitin cream but this has not seemed to help.  Vital signs at PCPs office demonstrated a fever of 101, heart rate of 122, blood pressure of 104/80, and oxygen saturation of 80%.  Oxygen saturation improved to 92% with repositioning.   Per chart review, patient recently had a long hospitalization from 11/14 to 12/8.  At that time, she was admitted for acute PE with large clot burden.  Patient underwent thrombectomy complicated by postprocedural acute hemoptysis requiring pressor support.  She subsequently developed sepsis secondary to pneumonia with COVID-19.  Additional complications in includes brain MRI with numerous chronic microhemorrhages.  Eventually, she was extubated and discharged to [facility].  She was discharged home on 03/31/2022.  ED course: On arrival to the ED, patient's temperature was 100.0 with heart rate of 126 and blood pressure 111/45.  She was saturating at 97% on room air.  Initial blood work remarkable for WBC of 29.1, hemoglobin of 10.5, platelets of 374, lactic acid 3.3.  CT chest/abdomen/pelvis was obtained that demonstrated improvement in the bilateral airspace opacities, however new presacral soft tissue stranding.  There was also a decubitus ulceration over the distal sacrum with adjacent soft tissue stranding.  No evidence of osteomyelitis or abscess.  Blood cultures were obtained.  Due to concern for cellulitis, patient was started on Vanco and cefepime.  TRH contacted for admission.  Review of Systems: As mentioned in the history of present illness. All other systems reviewed and are negative.  Past Medical History:  Diagnosis Date   GERD (gastroesophageal reflux disease)    Multiple sclerosis (HCC)    Past Surgical History:  Procedure Laterality Date   ESOPHAGOGASTRODUODENOSCOPY (EGD) WITH PROPOFOL N/A 09/11/2021   Procedure: ESOPHAGOGASTRODUODENOSCOPY (EGD) WITH PROPOFOL;  Surgeon: Jaynie Collins, DO;  Location: [facility] ENDOSCOPY;  Service: Gastroenterology;  Laterality: N/A;   PULMONARY THROMBECTOMY Bilateral 02/25/2022   Procedure: PULMONARY THROMBECTOMY;  Surgeon: Annice Needy, MD;  Location: [facility] INVASIVE CV LAB;  Service: Cardiovascular;  Laterality: Bilateral;   TUBAL LIGATION     Social History:  reports that she has never smoked. She has never used smokeless tobacco. She reports that she does not drink alcohol and does not use drugs.  Allergies  Allergen Reactions   Erythromycin Hives and Nausea And Vomiting   Lexapro [Escitalopram Oxalate] Hives   History reviewed. No pertinent family history.  Prior to Admission medications   Medication Sig Start Date End Date Taking? Authorizing Provider  apixaban (ELIQUIS) 5 MG TABS tablet Place 1 tablet (5 mg  total) into feeding tube 2 (two) times daily. 03/20/22   Jennye Boroughs, MD  ascorbic acid (VITAMIN C) 500 MG tablet Take by mouth.    [provider]  Cholecalciferol 50 MCG (2000  UT) CAPS Take by mouth.    [provider]  cyanocobalamin 1000 MCG tablet Place 1 tablet (1,000 mcg total) into feeding tube daily. 03/21/22   Jennye Boroughs, MD  cyclobenzaprine (FLEXERIL) 10 MG tablet Take 10 mg by mouth 2 (two) times daily as needed. 11/26/21   [provider]  dalfampridine 10 MG TB12 Take 1 tablet by mouth every 12 (twelve) hours. 12/29/21   [provider]  DULoxetine (CYMBALTA) 30 MG capsule Take 30 mg by mouth daily. 06/25/21   [provider]  gabapentin (NEURONTIN) 400 MG capsule Take Gabapentin 400 mg in the morning, 400 mg in the afternoon, and 800 mg at night 12/29/21   [provider]  metoprolol tartrate (LOPRESSOR) 25 MG tablet Place 1 tablet (25 mg total) into feeding tube every 6 (six) hours. 03/20/22   Jennye Boroughs, MD  nutrition supplement, JUVEN, (JUVEN) PACK Place 1 packet into feeding tube 2 (two) times daily between meals. 03/21/22   Jennye Boroughs, MD  Nutritional Supplements (FEEDING SUPPLEMENT, OSMOLITE 1.5 CAL,) LIQD Place 1,000 mLs into feeding tube continuous. 03/20/22   Jennye Boroughs, MD  pregabalin (LYRICA) 75 MG capsule Take 75 mg by mouth 3 (three) times daily. 08/18/21   [provider]  Protein (FEEDING SUPPLEMENT, PROSOURCE TF20,) liquid Place 60 mLs into feeding tube daily. 03/21/22   Jennye Boroughs, MD  rOPINIRole (REQUIP) 0.25 MG tablet Take 0.25 mg at night for 1 week, then increase to 0.5 mg at night 05/29/21   [provider]  Water For Irrigation, Sterile (FREE WATER) SOLN Place 100 mLs into feeding tube every 4 (four) hours. 03/20/22   Jennye Boroughs, MD    Physical Exam: Vitals:   04/16/22 1722 04/16/22 1730 04/16/22 1800 04/16/22 1819  BP:  103/81    Pulse: (!) 153 (!) 140 (!) 137 (!) 133  Resp: 20 (!) 22  (!) 26  Temp:   99.5 F (37.5 C)   TempSrc:   Oral   SpO2: 96% 99% 99% 99%  Weight:      Height:       Physical Exam Vitals and nursing note reviewed.  Constitutional:       General: She is not in acute distress.    Appearance: She is normal weight. She is not toxic-appearing.  HENT:     Head: Normocephalic and atraumatic.     Mouth/Throat:     Mouth: Mucous membranes are dry.     Pharynx: Oropharynx is clear.  Eyes:     Conjunctiva/sclera: Conjunctivae normal.     Pupils: Pupils are equal, round, and reactive to light.  Cardiovascular:     Rate and Rhythm: Regular rhythm. Tachycardia present.     Heart sounds: No murmur heard.    No gallop.  Pulmonary:     Effort: Pulmonary effort is normal. No respiratory distress.     Breath sounds: Rales (Right middle and lower lung fields, left lower lung field) present. No wheezing or rhonchi.  Abdominal:     General: Bowel sounds are normal. There is no distension.     Palpations: Abdomen is soft.     Tenderness: There is no abdominal tenderness. There is no guarding.  Musculoskeletal:     Cervical back: Neck supple.  Right lower leg: No edema.     Left lower leg: No edema.  Skin:    General: Skin is warm and dry.     Comments: On the sacral region, there is extensive erythema and induration surrounding a 3 cm wide and 1 cm deep pressure ulcer.  Erythema and induration tracks down towards the perirectal region.  There is purulence in the affected area, but no fluctuance or abscess palpated.  Neurological:     Mental Status: She is alert and oriented to person, place, and time.     Comments: Bilateral lower extremities with 1/5 strength Bilateral upper extremities with 2/5 strength  Psychiatric:        Mood and Affect: Mood normal.        Behavior: Behavior normal.        Thought Content: Thought content normal.        Judgment: Judgment normal.    Data Reviewed: CBC with WBC of 29.1, hemoglobin of 10.5, MCV 91, platelets of 374 CMP with sodium of 134, potassium 4.5, chloride of 97, bicarb of 22, creatinine 0.50, BUN 10, anion gap 15, alkaline phosphatase 184, albumin 3.1, AST 30, ALT 23. Lactic  acid 3.3 with improvement to 2.6 Procalcitonin elevated at 0.58 COVID-19, influenza and RSV negative.  EKG personally reviewed.  Sinus rhythm with rate of 128.  Nonspecific repolarization changes.  No findings concerning for acute ischemia.  DG Chest 2 View  Result Date: 04/16/2022 CLINICAL DATA:  Fever EXAM: CHEST - 2 VIEW COMPARISON:  Radiograph 03/19/2022 FINDINGS: There is overall improved airspace disease in the lungs with probable residual scarring and bandlike atelectasis bilaterally. There is no definite new airspace disease. There is no pleural effusion or evidence of pneumothorax. Cardiomediastinal silhouette is unchanged. Bones are unchanged. IMPRESSION: Improved airspace disease in comparison to recent exams, with residual scarring and bandlike atelectasis bilaterally. No definite new airspace disease. Electronically Signed   By: Maurine Simmering M.D.   On: 04/16/2022 16:21   CT CHEST ABDOMEN PELVIS W CONTRAST  Result Date: 04/16/2022 CLINICAL DATA:  Sepsis.  Fever. EXAM: CT CHEST, ABDOMEN, AND PELVIS WITH CONTRAST TECHNIQUE: Multidetector CT imaging of the chest, abdomen and pelvis was performed following the standard protocol during bolus administration of intravenous contrast. RADIATION DOSE REDUCTION: This exam was performed according to the departmental dose-optimization program which includes automated exposure control, adjustment of the mA and/or kV according to patient size and/or use of iterative reconstruction technique. CONTRAST:  13mL OMNIPAQUE IOHEXOL 300 MG/ML  SOLN COMPARISON:  Chest radiographs same date. CT of the chest, abdomen and pelvis 03/19/2022 and 03/03/2022. FINDINGS: CT CHEST FINDINGS Cardiovascular: No acute vascular findings. No significant atherosclerosis demonstrated. The heart size is normal. There is no pericardial effusion. Mediastinum/Nodes: There are no enlarged mediastinal, hilar or axillary lymph nodes. Posterior left thyroid nodule again noted measuring up to  1.8 cm in diameter. If not previously performed, recommend thyroid ultrasound. (Ref: J Am Coll Radiol. 2015 Feb;12(2): 143-50). Enteric tube has been removed in the interval. There is a moderate size hiatal hernia. Lungs/Pleura: No pleural effusion or pneumothorax. Previously demonstrated diffuse bilateral airspace opacities have significantly improved in the interval. There are scattered small areas of residual inflammation or postinflammatory scarring dependently in both upper and lower lobes. No new pulmonary findings. Musculoskeletal/Chest wall: No chest wall mass or suspicious osseous findings. CT ABDOMEN AND PELVIS FINDINGS Hepatobiliary: The liver is normal in density without suspicious focal abnormality. No evidence of gallstones, gallbladder wall thickening or biliary  dilatation. Pancreas: Unremarkable. No pancreatic ductal dilatation or surrounding inflammatory changes. Spleen: Normal in size without focal abnormality. Adrenals/Urinary Tract: Both adrenal glands appear normal. No evidence of urinary tract calculus, suspicious renal lesion or hydronephrosis. A small amount of air is noted non dependently within the bladder lumen. Previously demonstrated hyperdense focus dependently on the right is no longer seen. There is no bladder wall thickening. Stomach/Bowel: No enteric contrast administered. As above, moderate-size hiatal hernia. The stomach otherwise appears unremarkable for its degree of distention. No evidence of bowel wall thickening, distention or surrounding inflammation. The appendix appears normal. There is moderate stool throughout the colon. There is new presacral soft tissue stranding which appears non circumferential and without associated abnormality of the rectum. Vascular/Lymphatic: There are no enlarged abdominal or pelvic lymph nodes. No significant vascular findings. Reproductive: The uterus and ovaries appear stable. Probable small posterior uterine fundal fibroid. No adnexal mass.  Other: As above, new presacral soft tissue stranding without focal fluid collection, foreign body or soft tissue emphysema. No ascites or free air. Musculoskeletal: Apparent decubitus ulceration over the distal sacrum and coccyx with adjacent subcutaneous soft tissue stranding which may account for the pre sacral edema. No evidence of osteomyelitis. No evidence of acute fracture or dislocation. Mild facet hypertrophy noted. There is asymmetric fluid lateral to the right greater trochanter which appears similar to previous study, likely reflecting trochanteric bursitis. The soft tissues lateral to the hips are incompletely visualized on this examination. IMPRESSION: 1. Interval improvement in previously demonstrated diffuse bilateral airspace opacities consistent with resolving pneumonia. There are scattered small areas of residual inflammation or postinflammatory scarring dependently in both upper and lower lobes. No new pulmonary findings. 2. Apparent decubitus ulceration over the distal sacrum and coccyx with adjacent subcutaneous soft tissue stranding. No evidence of osteomyelitis or abscess. 3. New presacral soft tissue stranding without associated abnormality of the rectum. This is nonspecific and could be inflammatory or posttraumatic. 4. Small amount of air non dependently within the bladder lumen, likely iatrogenic. Previously demonstrated hyperdense focus dependently on the right is no longer seen. 5. Moderate-size hiatal hernia. 6. Stable asymmetric fluid lateral to the right greater trochanter, likely reflecting trochanteric bursitis. 7. Stable left thyroid nodule. If not previously performed, recommend thyroid ultrasound. Electronically Signed   By: Carey Bullocks M.D.   On: 04/16/2022 16:19    There are no new results to review at this time.  Assessment and Plan: * SIRS (systemic inflammatory response syndrome) (HCC) Patient presenting with several day chills, fatigue, and increasing generalized  weakness in the setting of a developing pressure ulcer.  Patient meets SIRS criteria given fever, tachycardia, elevated WBC.  Blood pressure is currently at her baseline per chart review.  Most likely etiology of SIRS is sacral cellulitis with fat stranding seen on CT imaging but no evidence of osteomyelitis or abscess.  Differential also includes UTI given possible increased urinary frequency.    No evidence of endorgan damage at this time to suggest sepsis.  However, patient is certainly at high risk.  - Telemetry monitoring - Continue broad-spectrum antibiotics with vancomycin and cefepime given location of cellulitis and risk of MDR - Add on metronidazole for anaerobic coverage - Continue IV fluid resuscitation - Blood cultures pending - Urinalysis and urine cultures to be obtained as soon as possible - Wound care consultation  Bilateral pulmonary embolism (HCC) CT obtained today did not show any evidence of PE.  Patient states that since starting Eliquis several weeks ago, she has  been experiencing dry mouth.  She would like to try Xarelto if possible to see if this alleviates her symptoms.  Discussed with pharmacy, and able to switch from Eliquis 5 mg twice daily to Xarelto 20 mg once daily.  - Start Xarelto 20 mg once daily - Counseled patient that she must take it with a meal  Multiple sclerosis (HCC) Patient feels that her MS may have been flaring due to fatigue and generalized weakness, however it is more likely a symptoms are in the setting of underlying infection.   - Continue home medications  Chronic constipation No bowel movement in 1 week.  - Start daily senna and MiraLAX  Tachycardia Patient states that she has a history of tachycardia and feels that current tachycardia may be due to this.  She is on daily propranolol.  Previous office visits reviewed.  Her heart rate seems previously well-controlled in the 70s and 80s when she was not ill.  Given her blood pressure is  currently at her baseline, can restart propranolol.  - Restart home propranolol  Aspiration into airway Prior to entering into the room, patient aspirated on a small amount of water and was coughing.  She states that she follows with a speech therapist who had instructed her to only take small sips, but she still finds this difficult.  She notes 1 prior similar event in the last few weeks.  Immediately after the event, her oxygen saturation dropped and she was started on low-dose supplemental oxygen.  - Continue supplemental oxygen to maintain oxygen saturation above 88% - Wean as tolerated - SLP evaluation - Regular diet with thickened consistency for now  Advance Care Planning:   Code Status: Full Code.  Verified with patient  Consults: None  Family Communication: Patient significant other updated at bedside  Severity of Illness: The appropriate patient status for this patient is INPATIENT. Inpatient status is judged to be reasonable and necessary in order to provide the required intensity of service to ensure the patient's safety. The patient's presenting symptoms, physical exam findings, and initial radiographic and laboratory data in the context of their chronic comorbidities is felt to place them at high risk for further clinical deterioration. Furthermore, it is not anticipated that the patient will be medically stable for discharge from the hospital within 2 midnights of admission.   * I certify that at the point of admission it is my clinical judgment that the patient will require inpatient hospital care spanning beyond 2 midnights from the point of admission due to high intensity of service, high risk for further deterioration and high frequency of surveillance required.*  Author: Verdene Lennert, MD 04/16/2022 7:53 PM  For on call review [email or web address].

## 2022-04-16 NOTE — ED Notes (Signed)
This RN assessed bed sore on bottom. Pt has large amount of thickening around sacrum region with cavity area. Pressure ulcer appears to be between stage 2 and stage 3. Pt bottom also covered in desitin per the caregiver.

## 2022-04-16 NOTE — ED Notes (Signed)
Report given to [place], RN in [employer]

## 2022-04-16 NOTE — Assessment & Plan Note (Addendum)
Patient presenting with several day chills, fatigue, and increasing generalized weakness in the setting of a developing pressure ulcer.  Patient meets SIRS criteria given fever, tachycardia, elevated WBC.  Blood pressure is currently at her baseline per chart review.  Most likely etiology of SIRS is sacral cellulitis with fat stranding seen on CT imaging but no evidence of osteomyelitis or abscess.  Differential also includes UTI given possible increased urinary frequency.    No evidence of endorgan damage at this time to suggest sepsis.  However, patient is certainly at high risk.  - Telemetry monitoring - Continue broad-spectrum antibiotics with vancomycin and cefepime given location of cellulitis and risk of MDR - Add on metronidazole for anaerobic coverage - Continue IV fluid resuscitation - Blood cultures pending - Urinalysis and urine cultures to be obtained as soon as possible - Wound care consultation

## 2022-04-16 NOTE — ED Triage Notes (Addendum)
Pt in from [facility] with fever 101, recently sent home from hospital, had covid and PNA at that time, possible hypoxia, possible sepsis. Initially pt was wondering if fatigue and weakness was bc of MS flare-up but then thought it may not be.

## 2022-04-16 NOTE — ED Notes (Signed)
Pt's bed linens and briefs changed out, peri care provided, pt repositioned on stretcher, purewick in use.

## 2022-04-16 NOTE — Assessment & Plan Note (Signed)
Prior to entering into the room, patient aspirated on a small amount of water and was coughing.  She states that she follows with a speech therapist who had instructed her to only take small sips, but she still finds this difficult.  She notes 1 prior similar event in the last few weeks.  Immediately after the event, her oxygen saturation dropped and she was started on low-dose supplemental oxygen.  - Continue supplemental oxygen to maintain oxygen saturation above 88% - Wean as tolerated - SLP evaluation - Regular diet with thickened consistency for now

## 2022-04-16 NOTE — ED Notes (Signed)
Attending remains at bedside.

## 2022-04-16 NOTE — Assessment & Plan Note (Signed)
No bowel movement in 1 week.  - Start daily senna and MiraLAX

## 2022-04-16 NOTE — Assessment & Plan Note (Signed)
Patient feels that her MS may have been flaring due to fatigue and generalized weakness, however it is more likely a symptoms are in the setting of underlying infection.   - Continue home medications

## 2022-04-16 NOTE — Assessment & Plan Note (Signed)
CT obtained today did not show any evidence of PE.  Patient states that since starting Eliquis several weeks ago, she has been experiencing dry mouth.  She would like to try Xarelto if possible to see if this alleviates her symptoms.  Discussed with pharmacy, and able to switch from Eliquis 5 mg twice daily to Xarelto 20 mg once daily.  - Start Xarelto 20 mg once daily - Counseled patient that she must take it with a meal

## 2022-04-16 NOTE — Assessment & Plan Note (Signed)
Patient states that she has a history of tachycardia and feels that current tachycardia may be due to this.  She is on daily propranolol.  Previous office visits reviewed.  Her heart rate seems previously well-controlled in the 70s and 80s when she was not ill.  Given her blood pressure is currently at her baseline, can restart propranolol.  - Restart home propranolol

## 2022-04-16 NOTE — Progress Notes (Signed)
PHARMACY -  BRIEF ANTIBIOTIC NOTE   Pharmacy has received consult(s) for vancomycin from an ED provider.  The patient's profile has been reviewed for ht/wt/allergies/indication/available labs.    One time order(s) placed for vancomycin 1250 mg IV x 1  Further antibiotics/pharmacy consults should be ordered by admitting physician if indicated.                       Thank you, Alison Murray 04/16/2022  4:48 PM

## 2022-04-16 NOTE — ED Notes (Signed)
Attending at bedside. Will complete I&O cath once doc done assessing and talking with pt and her bf.

## 2022-04-16 NOTE — ED Notes (Signed)
IV team to bedside. 

## 2022-04-16 NOTE — ED Notes (Signed)
Into room, pt choking, coughing after drinking water. See flowsheet for vs. Pt placed on 2LNC due to sats 82% RA.

## 2022-04-16 NOTE — ED Notes (Signed)
Pt adamant wanted to put both of her personal sweaters back on. Assisted pt to do so. Visitor remains at bedside and he assisted. Pt repositioned in bed.

## 2022-04-16 NOTE — ED Notes (Signed)
Attending verbal order to place purewick and collect fresh urine sample from canister; verbal hold on I&O cath at this time. States pt urinated while she was talking with pt and her bf; this P[employer] scheme manager will provide peri care soon.

## 2022-04-16 NOTE — Consult Note (Signed)
Pharmacy Antibiotic Note  [name] is a 62 y.o. female admitted on 04/16/2022 with  wound infection . PMH significant for PE s/p thrombectomy, MS, RLS. Patient was hospitalized 02/24/22-03/20/22 for acute PE with large clot burden complicated by sepsis 2/2 COVID-19 pneumonia requiring intubation. She received multiple IV antibiotics (Unasyn, Zosyn, Meropenem, Linezolid, Vancomycin, Cefepime) over the course of several weeks during that admission. Pharmacy has been consulted for vancomycin and cefepime dosing.  Plan: Day 1 of antibiotics Give Vancomycin 1250 mg IV x1 followed by 1250 mg IV Q24H. Goal AUC 400-550. Expected AUC: 512.6 Expected Css min: 11.3 SCr used: 0.8 (actual 0.50)  Weight used: IBW, Vd used: 0.72 (BMI 25.2) Start cefepime 2 g IV Q8H Patient is also on metronidazole 500 mg IV Q12H Continue to monitor renal function and follow culture results   Height: 5\' 2"  (157.5 cm) Weight: 62.6 kg (138 lb) IBW/kg (Calculated) : 50.1  Temp (24hrs), Avg:99.8 F (37.7 C), Min:99.5 F (37.5 C), Max:100 F (37.8 C)  Recent Labs  Lab 04/16/22 1307 04/16/22 1527  WBC 29.1*  --   CREATININE 0.50  --   LATICACIDVEN 3.3* 2.6*    Estimated Creatinine Clearance: 65.9 mL/min (by C-G formula based on SCr of 0.5 mg/dL).    Allergies  Allergen Reactions   Erythromycin Hives and Nausea And Vomiting   Lexapro [Escitalopram Oxalate] Hives    Antimicrobials this admission: 1/4 Vancomycin >>  1/4 Cefepime >>  1/4 Metronidazole >>  Dose adjustments this admission: N/A  Microbiology results: 1/4 BCx: IP  Thank you for allowing pharmacy to be a part of this patient's care.  Gretel Acre, PharmD PGY1 Pharmacy Resident 04/16/2022 7:44 PM

## 2022-04-16 NOTE — ED Provider Notes (Addendum)
[facility] Provider Note    Event Date/Time   First MD Initiated Contact with Patient 04/16/22 1345     (approximate)   History   Chief Complaint No chief complaint on file.   HPI [name] is a 62 y.o. female, history of multiple sclerosis, GERD, pulmonary embolism, presents to the emergency department for evaluation of fever.  She states that over the past week she has been experiencing fatigue, weakness, and intermittent fevers.  She states that is unsure and this is from a MS flareup or something else.  She was recently admitted 02/23/2022- 03/20/2022 for multifocal pneumonia and bilateral pulmonary embolisms.  She states that she feels like it could be related to this as well.  She does endorse a dry cough over the past couple weeks.  Lastly, she has also been experiencing a rash along her sacral region that her and her husband have been treating with antifungal cream/powder.  Denies chest pain, abdominal pain, shortness of breath, nausea/vomiting, rash/lesions, dizziness/lightheadedness, vision changes, hearing changes, or vertigo.  History Limitations: No limitations.        Physical Exam  Triage Vital Signs: ED Triage Vitals  Enc Vitals Group     BP 04/16/22 1305 (!) 111/45     Pulse Rate 04/16/22 1305 (!) 126     Resp 04/16/22 1305 20     Temp 04/16/22 1305 100 F (37.8 C)     Temp Source 04/16/22 1305 Oral     SpO2 04/16/22 1305 97 %     Weight 04/16/22 1311 138 lb (62.6 kg)     Height 04/16/22 1311 5\' 2"  (1.575 m)     Head Circumference --      Peak Flow --      Pain Score 04/16/22 1310 7     Pain Loc --      Pain Edu? --      Excl. in GC? --     Most recent vital signs: Vitals:   04/16/22 1722 04/16/22 1730  BP:  103/81  Pulse: (!) 153 (!) 140  Resp: 20 (!) 22  Temp:    SpO2: 96% 99%    General: Awake, NAD.  Skin: Warm, dry. No rashes or lesions.  Eyes: PERRL. Conjunctivae normal.  CV: Good peripheral  perfusion.  Resp: Normal effort.  Mild rhonchi bilaterally. Abd: Soft, non-tender. No distention.  Neuro: At baseline. No gross neurological deficits.  Musculoskeletal: Normal ROM of all extremities.  Focused Exam: There appears to be warmth, erythema, and tenderness along the sacral region, particularly along the left buttocks with what appears to be an ulceration more inferiorly.  Appears to be white substance around the affected area, for which she states that was an antifungal cream/powder.  No other active bleeding or discharge.  Physical Exam    ED Results / Procedures / Treatments  Labs (all labs ordered are listed, but only abnormal results are displayed) Labs Reviewed  CBC - Abnormal; Notable for the following components:      Result Value   WBC 29.1 (*)    Hemoglobin 10.5 (*)    HCT 35.4 (*)    MCHC 29.7 (*)    RDW 18.2 (*)    All other components within normal limits  COMPREHENSIVE METABOLIC PANEL - Abnormal; Notable for the following components:   Sodium 134 (*)    Chloride 97 (*)    Albumin 3.1 (*)    Alkaline Phosphatase 184 (*)    All other  components within normal limits  LACTIC ACID, PLASMA - Abnormal; Notable for the following components:   Lactic Acid, Venous 3.3 (*)    All other components within normal limits  LACTIC ACID, PLASMA - Abnormal; Notable for the following components:   Lactic Acid, Venous 2.6 (*)    All other components within normal limits  RESP PANEL BY RT-PCR (RSV, FLU A&B, COVID)  RVPGX2  CULTURE, BLOOD (ROUTINE X 2)  CULTURE, BLOOD (ROUTINE X 2)  PROCALCITONIN  PROCALCITONIN  URINALYSIS, ROUTINE W REFLEX MICROSCOPIC     EKG Sinus tachycardia, rate of 120, no segment changes, normal QRS, no QT prolongation, no significant axis deviations.    RADIOLOGY  ED Provider Interpretation: I personally reviewed and interpreted these images, chest x-ray shows improved airspace disease.  CT chest/abdomen/pelvis shows presacral soft tissue  stranding  DG Chest 2 View  Result Date: 04/16/2022 CLINICAL DATA:  Fever EXAM: CHEST - 2 VIEW COMPARISON:  Radiograph 03/19/2022 FINDINGS: There is overall improved airspace disease in the lungs with probable residual scarring and bandlike atelectasis bilaterally. There is no definite new airspace disease. There is no pleural effusion or evidence of pneumothorax. Cardiomediastinal silhouette is unchanged. Bones are unchanged. IMPRESSION: Improved airspace disease in comparison to recent exams, with residual scarring and bandlike atelectasis bilaterally. No definite new airspace disease. Electronically Signed   By: Maurine Simmering M.D.   On: 04/16/2022 16:21   CT CHEST ABDOMEN PELVIS W CONTRAST  Result Date: 04/16/2022 CLINICAL DATA:  Sepsis.  Fever. EXAM: CT CHEST, ABDOMEN, AND PELVIS WITH CONTRAST TECHNIQUE: Multidetector CT imaging of the chest, abdomen and pelvis was performed following the standard protocol during bolus administration of intravenous contrast. RADIATION DOSE REDUCTION: This exam was performed according to the departmental dose-optimization program which includes automated exposure control, adjustment of the mA and/or kV according to patient size and/or use of iterative reconstruction technique. CONTRAST:  190mL OMNIPAQUE IOHEXOL 300 MG/ML  SOLN COMPARISON:  Chest radiographs same date. CT of the chest, abdomen and pelvis 03/19/2022 and 03/03/2022. FINDINGS: CT CHEST FINDINGS Cardiovascular: No acute vascular findings. No significant atherosclerosis demonstrated. The heart size is normal. There is no pericardial effusion. Mediastinum/Nodes: There are no enlarged mediastinal, hilar or axillary lymph nodes. Posterior left thyroid nodule again noted measuring up to 1.8 cm in diameter. If not previously performed, recommend thyroid ultrasound. (Ref: J Am Coll Radiol. 2015 Feb;12(2): 143-50). Enteric tube has been removed in the interval. There is a moderate size hiatal hernia. Lungs/Pleura: No  pleural effusion or pneumothorax. Previously demonstrated diffuse bilateral airspace opacities have significantly improved in the interval. There are scattered small areas of residual inflammation or postinflammatory scarring dependently in both upper and lower lobes. No new pulmonary findings. Musculoskeletal/Chest wall: No chest wall mass or suspicious osseous findings. CT ABDOMEN AND PELVIS FINDINGS Hepatobiliary: The liver is normal in density without suspicious focal abnormality. No evidence of gallstones, gallbladder wall thickening or biliary dilatation. Pancreas: Unremarkable. No pancreatic ductal dilatation or surrounding inflammatory changes. Spleen: Normal in size without focal abnormality. Adrenals/Urinary Tract: Both adrenal glands appear normal. No evidence of urinary tract calculus, suspicious renal lesion or hydronephrosis. A small amount of air is noted non dependently within the bladder lumen. Previously demonstrated hyperdense focus dependently on the right is no longer seen. There is no bladder wall thickening. Stomach/Bowel: No enteric contrast administered. As above, moderate-size hiatal hernia. The stomach otherwise appears unremarkable for its degree of distention. No evidence of bowel wall thickening, distention or surrounding inflammation. The appendix  appears normal. There is moderate stool throughout the colon. There is new presacral soft tissue stranding which appears non circumferential and without associated abnormality of the rectum. Vascular/Lymphatic: There are no enlarged abdominal or pelvic lymph nodes. No significant vascular findings. Reproductive: The uterus and ovaries appear stable. Probable small posterior uterine fundal fibroid. No adnexal mass. Other: As above, new presacral soft tissue stranding without focal fluid collection, foreign body or soft tissue emphysema. No ascites or free air. Musculoskeletal: Apparent decubitus ulceration over the distal sacrum and coccyx  with adjacent subcutaneous soft tissue stranding which may account for the pre sacral edema. No evidence of osteomyelitis. No evidence of acute fracture or dislocation. Mild facet hypertrophy noted. There is asymmetric fluid lateral to the right greater trochanter which appears similar to previous study, likely reflecting trochanteric bursitis. The soft tissues lateral to the hips are incompletely visualized on this examination. IMPRESSION: 1. Interval improvement in previously demonstrated diffuse bilateral airspace opacities consistent with resolving pneumonia. There are scattered small areas of residual inflammation or postinflammatory scarring dependently in both upper and lower lobes. No new pulmonary findings. 2. Apparent decubitus ulceration over the distal sacrum and coccyx with adjacent subcutaneous soft tissue stranding. No evidence of osteomyelitis or abscess. 3. New presacral soft tissue stranding without associated abnormality of the rectum. This is nonspecific and could be inflammatory or posttraumatic. 4. Small amount of air non dependently within the bladder lumen, likely iatrogenic. Previously demonstrated hyperdense focus dependently on the right is no longer seen. 5. Moderate-size hiatal hernia. 6. Stable asymmetric fluid lateral to the right greater trochanter, likely reflecting trochanteric bursitis. 7. Stable left thyroid nodule. If not previously performed, recommend thyroid ultrasound. Electronically Signed   By: Carey Bullocks M.D.   On: 04/16/2022 16:19    PROCEDURES:  Critical Care performed: N/A.  Procedures    MEDICATIONS ORDERED IN ED: Medications  ceFEPIme (MAXIPIME) 1 g in sodium chloride 0.9 % 100 mL IVPB (0 g Intravenous Stopped 04/16/22 1745)  vancomycin (VANCOREADY) IVPB 1250 mg/250 mL (has no administration in time range)  sodium chloride 0.9 % bolus 1,000 mL (1,000 mLs Intravenous New Bag/Given 04/16/22 1529)  iohexol (OMNIPAQUE) 300 MG/ML solution 100 mL (100 mLs  Intravenous Contrast Given 04/16/22 1548)     IMPRESSION / MDM / ASSESSMENT AND PLAN / ED COURSE  I reviewed the triage vital signs and the nursing notes.                              Differential diagnosis includes, but is not limited to, pneumonia, sepsis, pulmonary embolism, cystitis, pyelonephritis,  ED Course Patient appears stable, though very tachycardic with heart rate of 127.  Elevated temperature at 100 F.  Otherwise normal vitals.  CBC shows notable leukocytosis at 29.1.  Moderate anemia present with hemoglobin of 10.5.  CMP shows no significant electrolyte abnormalities, AKI, or transaminitis.  Respiratory panel negative for COVID-19, influenza, or RSV.  Lactic acid notably elevated at 2.6.  Procalcitonin 0.58.  Assessment/Plan Patient presents with fever, fatigue, weakness x 1 week.  On physical exam, she does have significant erythema, warmth, and tenderness along the sacral region/left buttocks with a decubitus ulceration.  No evidence of abscess at this time.  Lab workup notable for significant leukocytosis with elevated lactate.  CT chest/abdomen/pelvis shows overall improving airspace disease, but does show soft tissue stranding along the sacral region, consistent with physical exam findings, as well as a small amount air  in the bladder lumen, favored to be iatrogenic.  I suspect that her symptoms are likely from soft tissue infection, however still pending urinalysis.  Possible urinary tract infection.  In the meantime, will initiate IV antibiotics and admit to the hospital.  Spoke with Dr. Charleen Kirks, who agreed to admission.  Patient's presentation is most consistent with acute presentation with potential threat to life or bodily function.       FINAL CLINICAL IMPRESSION(S) / ED DIAGNOSES   Final diagnoses:  Fever, unspecified fever cause  Cellulitis of sacral region     Rx / DC Orders   ED Discharge Orders     None        Note:  This document was  prepared using Dragon voice recognition software and may include unintentional dictation errors.   Teodoro Spray, PA 04/16/22 [place], [place], [place] 04/16/22 [place]    Delman Kitten, MD 04/16/22 2014

## 2022-04-17 ENCOUNTER — Inpatient Hospital Stay: Payer: Medicare Other

## 2022-04-17 ENCOUNTER — Encounter: Payer: Self-pay | Admitting: Internal Medicine

## 2022-04-17 DIAGNOSIS — G35 Multiple sclerosis: Secondary | ICD-10-CM | POA: Diagnosis not present

## 2022-04-17 DIAGNOSIS — R6520 Severe sepsis without septic shock: Secondary | ICD-10-CM

## 2022-04-17 DIAGNOSIS — A419 Sepsis, unspecified organism: Secondary | ICD-10-CM

## 2022-04-17 DIAGNOSIS — L03319 Cellulitis of trunk, unspecified: Secondary | ICD-10-CM | POA: Diagnosis not present

## 2022-04-17 DIAGNOSIS — N390 Urinary tract infection, site not specified: Secondary | ICD-10-CM | POA: Diagnosis not present

## 2022-04-17 DIAGNOSIS — R6510 Systemic inflammatory response syndrome (SIRS) of non-infectious origin without acute organ dysfunction: Secondary | ICD-10-CM

## 2022-04-17 DIAGNOSIS — L89154 Pressure ulcer of sacral region, stage 4: Secondary | ICD-10-CM | POA: Diagnosis present

## 2022-04-17 LAB — CBC WITH DIFFERENTIAL/PLATELET
Abs Immature Granulocytes: 1.13 10*3/uL — ABNORMAL HIGH (ref 0.00–0.07)
Basophils Absolute: 0 10*3/uL (ref 0.0–0.1)
Basophils Relative: 0 %
Eosinophils Absolute: 0 10*3/uL (ref 0.0–0.5)
Eosinophils Relative: 0 %
HCT: 22.9 % — ABNORMAL LOW (ref 36.0–46.0)
Hemoglobin: 7 g/dL — ABNORMAL LOW (ref 12.0–15.0)
Immature Granulocytes: 7 %
Lymphocytes Relative: 3 %
Lymphs Abs: 0.6 10*3/uL — ABNORMAL LOW (ref 0.7–4.0)
MCH: 27 pg (ref 26.0–34.0)
MCHC: 30.6 g/dL (ref 30.0–36.0)
MCV: 88.4 fL (ref 80.0–100.0)
Monocytes Absolute: 1.2 10*3/uL — ABNORMAL HIGH (ref 0.1–1.0)
Monocytes Relative: 7 %
Neutro Abs: 14.5 10*3/uL — ABNORMAL HIGH (ref 1.7–7.7)
Neutrophils Relative %: 83 %
Platelets: 265 10*3/uL (ref 150–400)
RBC: 2.59 MIL/uL — ABNORMAL LOW (ref 3.87–5.11)
RDW: 18.2 % — ABNORMAL HIGH (ref 11.5–15.5)
Smear Review: NORMAL
WBC: 17.4 10*3/uL — ABNORMAL HIGH (ref 4.0–10.5)
nRBC: 0 % (ref 0.0–0.2)

## 2022-04-17 LAB — TYPE AND SCREEN
ABO/RH(D): O POS
Antibody Screen: NEGATIVE

## 2022-04-17 LAB — COMPREHENSIVE METABOLIC PANEL
ALT: 16 U/L (ref 0–44)
AST: 13 U/L — ABNORMAL LOW (ref 15–41)
Albumin: 2.1 g/dL — ABNORMAL LOW (ref 3.5–5.0)
Alkaline Phosphatase: 127 U/L — ABNORMAL HIGH (ref 38–126)
Anion gap: 6 (ref 5–15)
BUN: 10 mg/dL (ref 6–20)
CO2: 22 mmol/L (ref 22–32)
Calcium: 7.5 mg/dL — ABNORMAL LOW (ref 8.9–10.3)
Chloride: 106 mmol/L (ref 98–111)
Creatinine, Ser: 0.52 mg/dL (ref 0.44–1.00)
GFR, Estimated: >60 mL/min (ref >60)
Glucose, Bld: 98 mg/dL (ref 70–99)
Potassium: 3.2 mmol/L — ABNORMAL LOW (ref 3.5–5.1)
Sodium: 134 mmol/L — ABNORMAL LOW (ref 135–145)
Total Bilirubin: 1 mg/dL (ref 0.3–1.2)
Total Protein: 5.1 g/dL — ABNORMAL LOW (ref 6.5–8.1)

## 2022-04-17 LAB — MAGNESIUM: Magnesium: 1.9 mg/dL (ref 1.7–2.4)

## 2022-04-17 LAB — POTASSIUM: Potassium: 3.9 mmol/L (ref 3.5–5.1)

## 2022-04-17 LAB — HEMOGLOBIN AND HEMATOCRIT, BLOOD
HCT: 27.3 % — ABNORMAL LOW (ref 36.0–46.0)
Hemoglobin: 8 g/dL — ABNORMAL LOW (ref 12.0–15.0)

## 2022-04-17 LAB — PROCALCITONIN: Procalcitonin: 1.25 ng/mL

## 2022-04-17 MED ORDER — SODIUM CHLORIDE 0.9 % IV BOLUS
500.0000 mL | Freq: Once | INTRAVENOUS | Status: AC
  Administered 2022-04-17: 500 mL via INTRAVENOUS

## 2022-04-17 MED ORDER — MAGNESIUM OXIDE -MG SUPPLEMENT 400 (240 MG) MG PO TABS
400.0000 mg | ORAL_TABLET | Freq: Once | ORAL | Status: AC
  Administered 2022-04-17: 400 mg via ORAL
  Filled 2022-04-17: qty 1

## 2022-04-17 MED ORDER — POTASSIUM CHLORIDE CRYS ER 20 MEQ PO TBCR
20.0000 meq | EXTENDED_RELEASE_TABLET | Freq: Once | ORAL | Status: AC
  Administered 2022-04-17: 20 meq via ORAL
  Filled 2022-04-17: qty 1

## 2022-04-17 MED ORDER — MEDIHONEY WOUND/BURN DRESSING EX PSTE
1.0000 | PASTE | Freq: Every day | CUTANEOUS | Status: DC
  Administered 2022-04-17 – 2022-04-21 (×5): 1 via TOPICAL
  Filled 2022-04-17 (×2): qty 44

## 2022-04-17 MED ORDER — POTASSIUM CHLORIDE CRYS ER 20 MEQ PO TBCR
40.0000 meq | EXTENDED_RELEASE_TABLET | Freq: Once | ORAL | Status: AC
  Administered 2022-04-17: 40 meq via ORAL
  Filled 2022-04-17: qty 2

## 2022-04-17 NOTE — Consult Note (Addendum)
WOC consult requested for sacrum wound.    The following secure chat message was sent to the primary team, "There are NO WOC nurses working today, since several on our team have Covid.  I am attempting to handle as much as possible from home.  Can you please post a photo of the sacrum wound, no hurry, and I will provide topical treatment recommendations.  Thank-you  Post note 11:00: There are now photos for review.  Pt has several areas  of Unstageable pressure injuries to the bilat buttocks/sacrum with black [name] eschar.  Wound in the center also has depth to the wound.  Erythemia and patchy areas of full thickness skin loss noted in between wounds, as well as [name] creaked peeling skin to outer wound edges. . Topical treatment orders provided for bedside nurses to perform to assist with removal of nonviable tissue: Apply Medihoney to black wounds on buttocks/sacrum, then cover with moist gauze, using swab to fill in depth.  Cover with ABD pads and tape. These wounds were noted as present on admission. Please re-consult if further assistance is needed.  [name] Dry MSN, RN, [place], [place], [place] .

## 2022-04-17 NOTE — Progress Notes (Signed)
       CROSS COVER NOTE  NAME: [name] MRN: [id number] DOB : Jun 20, 1960    HPI/Events of Note   Patient remains tachy with soft pressures. RED MEWS  Assessment and  Interventions   Assessment: 3 gm drop in HGB from 1/4 to 1/5. No overt evidence of acute bleed Hypokalemia this am Plan: Repeat H & H, 8.0/27.3 - continue to monitor - has hx of pulmonary hemorrhage Recheck K and check mag 3.9/1.9 - additional 20 meq of potassium and 400 mg mag ox for goal  K above 4/ mag above 2 Continue inderal       Kathlene Cote NP [facility]

## 2022-04-17 NOTE — ED Notes (Signed)
Contacted MD about pt's BP.

## 2022-04-17 NOTE — TOC Progression Note (Signed)
Transition of Care [facility]) - Progression Note    Patient Details  Name: [name] MRN: [id number] Date of Birth: Jun 26, 1960  Transition of [facility] CM/SW Contact  Shelbie Hutching, RN Phone Number: 04/17/2022, 2:40 PM  Clinical Narrative:    Received a call from [facility] with [employer], she spoke with the patient and patient did not want to pay for the electric lift and declined the [facility] hoyer lift as well.     Expected Discharge Plan: [place] Barriers to Discharge: Continued Medical Work up  Expected Discharge Plan and Services   Discharge Planning Services: CM Consult Post Acute Care Choice: Resumption of Svcs/PTA Provider Living arrangements for the past 2 months: Single Family Home                 DME Arranged: Other see comment (hoyer lift) DME Agency: [employer] Date DME Agency Contacted: 04/17/22 Time DME Agency Contacted: [phone] Representative spoke with at DME Agency: [place]: RN, PT, OT [facility] Agency: [place] ([place]) Date [place]: 04/17/22 Time [place]: [place] Representative spoke with at [place]: [place] ([place]) Interventions [place]: No Food Insecurity (04/17/2022)  Housing: Low Risk  (04/17/2022)  Transportation Needs: No Transportation Needs (04/17/2022)  Utilities: Not At Risk (04/17/2022)  Tobacco Use: Low Risk  (04/17/2022)    Readmission Risk Interventions     No data to display

## 2022-04-17 NOTE — Consult Note (Signed)
Pharmacy Antibiotic Note  [name] is a 62 y.o. female admitted on 04/16/2022 with  wound infection . PMH significant for PE s/p thrombectomy, MS, RLS. Patient was hospitalized 02/24/22-03/20/22 for acute PE with large clot burden complicated by sepsis 2/2 COVID-19 pneumonia requiring intubation. She received multiple IV antibiotics (Unasyn, Zosyn, Meropenem, Linezolid, Vancomycin, Cefepime) over the course of several weeks during that admission. Pharmacy has been consulted for vancomycin and cefepime dosing.  Plan: Day 2 of antibiotics, Scr stable  Continue vancomycin 1250 mg IV Q24H. Goal AUC 400-550. Expected AUC: 512.6 Expected Css min: 11.3 SCr used: 0.8 (actual 0.50)  Weight used: IBW, Vd used: 0.72 (BMI 25.2) Continue cefepime 2 g IV Q8H Patient is also on metronidazole 500 mg IV Q12H Continue to monitor renal function and follow culture results   Height: 5\' 2"  (157.5 cm) Weight: 62.6 kg (138 lb) IBW/kg (Calculated) : 50.1  Temp (24hrs), Avg:98.7 F (37.1 C), Min:97.9 F (36.6 C), Max:100 F (37.8 C)  Recent Labs  Lab 04/16/22 1307 04/16/22 1527 04/17/22 0410  WBC 29.1*  --  17.4*  CREATININE 0.50  --  0.52  LATICACIDVEN 3.3* 2.6*  --      Estimated Creatinine Clearance: 65.9 mL/min (by C-G formula based on SCr of 0.52 mg/dL).    Allergies  Allergen Reactions   Erythromycin Hives and Nausea And Vomiting   Lexapro [Escitalopram Oxalate] Hives    Antimicrobials this admission: 1/4 Vancomycin >>  1/4 Cefepime >>  1/4 Metronidazole >>  Dose adjustments this admission: N/A  Microbiology results: 1/4 BCx: NGTD  Thank you for allowing pharmacy to be a part of this patient's care.  Aubery Lapping, PharmD 04/17/2022 1:09 PM

## 2022-04-17 NOTE — Progress Notes (Signed)
       CROSS COVER NOTE  NAME: [name] MRN: [id number] DOB : 02-04-1961  HPI/Events of Note   Nurse reports soft pressures and patient states she runs low  Assessment and  Interventions   Assessment: 78/45 (55) 88/54 (6) on retake with teart rate 118-120. Admitted with SIRS - only received 1 L fluid bolus + continuous at 125/h Plan: 500 ml fluid bolus NS       Kathlene Cote NP [facility]

## 2022-04-17 NOTE — ED Notes (Signed)
Secure chat to provider to inform of heart rate

## 2022-04-17 NOTE — Evaluation (Signed)
Clinical/Bedside Swallow Evaluation Patient Details  Name: [name] MRN: [id number] Date of Birth: 1960/09/15  Today's Date: 04/17/2022 Time: SLP Start Time (ACUTE ONLY): 0930 SLP Stop Time (ACUTE ONLY): 7628 SLP Time Calculation (min) (ACUTE ONLY): 45 min  Past Medical History:  Past Medical History:  Diagnosis Date   GERD (gastroesophageal reflux disease)    Multiple sclerosis ([place])    Past Surgical History:  Past Surgical History:  Procedure Laterality Date   ESOPHAGOGASTRODUODENOSCOPY (EGD) WITH PROPOFOL N/A 09/11/2021   Procedure: ESOPHAGOGASTRODUODENOSCOPY (EGD) WITH PROPOFOL;  Surgeon: Annamaria Helling, DO;  Location: [facility] ENDOSCOPY;  Service: Gastroenterology;  Laterality: N/A;   PULMONARY THROMBECTOMY Bilateral 02/25/2022   Procedure: PULMONARY THROMBECTOMY;  Surgeon: Algernon Huxley, MD;  Location: [facility];  Service: Cardiovascular;  Laterality: Bilateral;   TUBAL LIGATION     HPI:  Pt is a 62 y.o. female with medical history significant for hypertension, depression, anxiety, restless leg syndrome, HIatal Hernia, GERD, multiple sclerosis, history of COVID-19 infection in November 2023, bilateral pulmonary embolism and bilateral lower extremity DVT diagnosed in November 2023, s/p pulmonary thrombectomy on 02/25/2022, recent discharge from the hospital on 03/20/2022 to [facility] after hospitalization 02/24/2022 for acute PE, aspiration pneumonia, sepsis, respiratory failure and metabolic encephalopathy.  She said she stayed at [facility] for about 2 weeks.   She presented to the hospital because of fever.  She was actually referred from [facility] clinic to the emergency department.  She complained of generalized weakness, fatigue, chills and a wound on her buttock.  Her significant other had been applying Desitin cream but it did not seem to help.  She was hypoxic with oxygen saturation of 80%, tachycardic with heart rate of 122, febrile  with temperature of 101 F at the PCPs office.   She was admitted to the hospital for severe sepsis secondary to acute UTI and sacral cellulitis.   CT of Chest at admit: Lungs/Pleura: No pleural effusion or pneumothorax. Previously  demonstrated diffuse bilateral airspace opacities have significantly  improved in the interval. There are scattered small areas of  residual inflammation or postinflammatory scarring dependently in  both upper and lower lobes. No new pulmonary findings.    Assessment / Plan / Recommendation  Clinical Impression  Pt seen for BSE today. Pt awake, resting in bed but too reclined for eating the breakfast tray in front of her. Pt verbal and engaged in conversation easily; followed all directions. Boyfriend arrived at end of session.  On RA; afebrile.   Pt appears to present w/ functional oropharyngeal phase swallowing w/ No overt oropharyngeal phase dysphagia noted, No neuromuscular deficits impacting swallowing noted. Pt consumed po trials w/ No immediate, overt clinical s/s of aspiration during po trials. Pt appears at reduced risk for aspiration following general aspiration and REFLUX precautions.   However, pt does have challenging factors that could impact her oropharyngeal swallowing to include deconditioning/weakness from recent illness/hospitalization, chronic illness/MS, Esophageal issues of GERD and Hiatal Hernia, and need for support w/ positioning for meals. These factors can increase risk for dysphagia, aspiration as well as decreased oral intake overall.  Pt has a baseline of GERD and Hiatal Hernia per recent EGD. ANY Dysmotility or Regurgitation of Reflux material can increase risk for aspiration of the Reflux material during Retrograde flow thus impact Voicing and Pulmonary status. Pt described the feeling of food "sticking in my chest" pointing to mid sternum area; strongly recommend f/u w/ GI for management of above issues.  OF NOTE: Pt w/ recent admit in 02/2022  involving a lengthy oral intubation of ~2.5 weeks. She was extubated and transferred to [facility] while still NPO. At [facility], she stated she received a swallow study which cleared her for an oral diet w/ general aspiration precautions, which she could recite. She has been eating a regular diet at home but endorsed a reduced appetite.    During po trials, pt consumed all consistencies w/ no overt coughing, decline in vocal quality, or change in respiratory presentation during/post trials. O2 sats remained 97%. Oral phase appeared [facility] w/ timely bolus management, mastication, and control of bolus propulsion for A-P transfer for swallowing. Oral clearing achieved w/ all trial consistencies -- moistened, soft foods given for ease of eating and Esophageal clearing.  OM Exam appeared [facility] w/ no unilateral weakness noted; lingual strength from protrusion/ROM was [facility]. Speech Clear, min low volume. Pt fed self w/ setup support given.   Recommend a more Mech Soft/Regular consistency diet w/ well-Cut meats, moistened foods; Thin liquids -- carefully monitor straw use. Recommend general aspiration precautions, setup and positioning support. Pills WHOLE in Puree for safer, easier swallowing -- pt has done this in the past, and it is encourged now and for D/C; pt agreed.   Education given on Pills in Puree; food consistencies and easy to eat options for conservation of energy and Esophageal ease; general aspiration precautions to pt. NSG to reconsult if any new needs arise. NSG updated, agreed. MD updated. Recommend Dietician f/u for support. SLP Visit Diagnosis: Dysphagia, unspecified (R13.10) (baseline GERD, Hiatal Hernia, MS)    Aspiration Risk  Mild aspiration risk;Risk for inadequate nutrition/hydration (reduced following general aspiration and REFLUX precautions)    Diet Recommendation   a more Mech Soft/Regular consistency diet w/ well-Cut meats, moistened foods; Thin liquids -- carefully monitor straw use. Recommend  general aspiration precautions, setup support and positioning upright at meals.   Medication Administration: Whole meds with puree (for ease and safer swallowing)    Other  Recommendations Recommended Consults: Consider GI evaluation;Consider esophageal assessment (management of GERD and Hiatal Hernia; Dietician) Oral Care Recommendations: Oral care BID;Patient independent with oral care (setup) Other Recommendations:  (n/a)    Recommendations for follow up therapy are one component of a multi-disciplinary discharge planning process, led by the attending physician.  Recommendations may be updated based on patient status, additional functional criteria and insurance authorization.  Follow up Recommendations No SLP follow up      Assistance Recommended at Discharge  PRN-intermittent for setup  Functional Status Assessment Patient has had a recent decline in their functional status and demonstrates the ability to make significant improvements in function in a reasonable and predictable amount of time.  Frequency and Duration  (n/a)   (n/a)       Prognosis Prognosis for Safe Diet Advancement: Fair (-Good) Barriers to Reach Goals: Time post onset;Severity of deficits Barriers/Prognosis Comment: Hiatal Hernia; GERD; MS      Swallow Study   General Date of Onset: 04/16/22 HPI: Pt is a 62 y.o. female with medical history significant for hypertension, depression, anxiety, restless leg syndrome, HIatal Hernia, GERD, multiple sclerosis, history of COVID-19 infection in November 2023, bilateral pulmonary embolism and bilateral lower extremity DVT diagnosed in November 2023, s/p pulmonary thrombectomy on 02/25/2022, recent discharge from the hospital on 03/20/2022 to [facility] hospital after hospitalization 02/24/2022 for acute PE, aspiration pneumonia, sepsis, respiratory failure and metabolic encephalopathy.  She said she stayed at [facility] for about  2 weeks.   She presented to the  hospital because of fever.  She was actually referred from [facility] clinic to the emergency department.  She complained of generalized weakness, fatigue, chills and a wound on her buttock.  Her significant other had been applying Desitin cream but it did not seem to help.  She was hypoxic with oxygen saturation of 80%, tachycardic with heart rate of 122, febrile with temperature of 101 F at the PCPs office.   She was admitted to the hospital for severe sepsis secondary to acute UTI and sacral cellulitis.   CT of Chest at admit: Lungs/Pleura: No pleural effusion or pneumothorax. Previously  demonstrated diffuse bilateral airspace opacities have significantly  improved in the interval. There are scattered small areas of  residual inflammation or postinflammatory scarring dependently in  both upper and lower lobes. No new pulmonary findings. Type of Study: Bedside Swallow Evaluation Previous Swallow Assessment: BSE 03/16/22 - NPO until objective assessment/LTACH Diet Prior to this Study: Regular;Thin liquids (per MD) Temperature Spikes Noted: No (wbc 17.4 trending down) Respiratory Status: Room air History of Recent Intubation: No Behavior/Cognition: Alert;Cooperative;Pleasant mood Oral Cavity Assessment: Within Functional Limits Oral Care Completed by SLP: Recent completion by staff Oral Cavity - Dentition: Adequate natural dentition Vision: Functional for self-feeding Self-Feeding Abilities: Able to feed self;Needs assist;Needs set up (generalized weakness) Patient Positioning: Upright in bed (needed support and positioning midline/upright) Baseline Vocal Quality: Normal;Low vocal intensity (min) Volitional Cough: Strong Volitional Swallow: Able to elicit    Oral/Motor/Sensory Function Overall Oral Motor/Sensory Function: Within functional limits (adequate lingual strength/ROM)   Ice Chips Ice chips: Within functional limits Presentation: Spoon (fed; 2 trials)   Thin Liquid Thin Liquid: Within  functional limits Presentation: Cup;Self Fed;Straw (10+ trials)    Nectar Thick Nectar Thick Liquid: Not tested   Honey Thick Honey Thick Liquid: Not tested   Puree Puree: Within functional limits Presentation: Self Fed;Spoon (7-8 trials)   Solid     Solid: Within functional limits Presentation: Self Fed (5+ bites) Other Comments: moistened foods for ease of Esophageal clearing        Orinda Kenner, MS, CCC-SLP Speech Language Pathologist Rehab Services; [place] [phone] (ascom) Jordi Kamm 04/17/2022,3:17 PM

## 2022-04-17 NOTE — Progress Notes (Signed)
   04/17/22 1600  Spiritual Encounters  Type of Visit Initial  Care provided to: Patient  Conversation partners present during encounter Nurse  Referral source Nurse (RN/NT/LPN)  Reason for visit Advance directives  OnCall Visit Yes   Chaplain responded to nurse consult. Chaplain provided advance directive paperwork to patient. Chaplain services are available for follow up as needed.

## 2022-04-17 NOTE — TOC Initial Note (Signed)
Transition of Care [facility]) - Initial/Assessment Note    Patient Details  Name: [name] MRN: [id number] Date of Birth: March 02, 1961  Transition of [facility]) CM/SW Contact:    Shelbie Hutching, RN Phone Number: 04/17/2022, 1:02 PM  Clinical Narrative:                 Patient admitted to the hospital with fever and sepsis.  RNCM met with patient at the bedside, significant other, Quillian Quince also at the bedside.  Patient is from home with Quillian Quince, she is current with [facility] for PT, OT, and ST, we will need to add RN at discharge for wound care.  Patient has an electric wheelchair, the home has ramps in and out, walk in shower, and grab bars throughout.  Significant other provides transportation they have a wheelchair lift on the back of the vehicle.   Corene Cornea with [facility] is aware that patient is at the hospital.  Patient could use a hoyer lift at home.  Hoyer lift ordered from [employer], family requests fully electric.    Expected Discharge Plan: [place] Barriers to Discharge: Continued Medical Work up   Patient Goals and CMS Choice Patient states their goals for this hospitalization and ongoing recovery are:: patient wants to go home CMS Medicare.gov Compare Post Acute Care list provided to:: Patient Choice offered to / list presented to : Patient      Expected Discharge Plan and Services   Discharge Planning Services: CM Consult Post Acute Care Choice: Resumption of Svcs/PTA Provider Living arrangements for the past 2 months: Single Family Home                 DME Arranged: Other see comment (hoyer lift) DME Agency: [employer] Date DME Agency Contacted: 04/17/22 Time DME Agency Contacted: 17 Representative spoke with at DME Agency: [place]: RN, PT, [place] Agency: [place] ([place]) Date [place]: 04/17/22 Time [place]: [place] Representative spoke with at [place]: [place]  Arrangements/Services Living arrangements for the past 2 months: [place] Lives with:: Spouse Patient language and need for interpreter reviewed:: Yes Do you feel safe going back to the place where you live?: Yes      Need for Family Participation in Patient Care: Yes (Comment) Care giver support system in place?: Yes (comment) Current home services: DME (ramps in and out of home, electric wheelchair) Criminal Activity/Legal Involvement Pertinent to Current Situation/Hospitalization: No - Comment as needed  Activities of Daily Living Home Assistive Devices/Equipment: Transport planner, Teacher, adult education, Eyeglasses ADL Screening (condition at time of admission) Patient's cognitive ability adequate to safely complete daily activities?: Yes Is the patient deaf or have difficulty hearing?: No Does the patient have difficulty seeing, even when wearing glasses/contacts?: No Does the patient have difficulty concentrating, remembering, or making decisions?: No Patient able to express need for assistance with ADLs?: Yes Does the patient have difficulty dressing or bathing?: Yes Independently performs ADLs?: No Communication: Independent Dressing (OT): Needs assistance Is this a change from baseline?: Pre-admission baseline Grooming: Independent Feeding: Independent Bathing: Needs assistance Is this a change from baseline?: Pre-admission baseline Toileting: Needs assistance Is this a change from baseline?: Pre-admission baseline In/Out Bed: Needs assistance Is this a change from baseline?: Pre-admission baseline Walks in Home: Dependent Is this a change from baseline?: Pre-admission baseline Does the patient have difficulty walking or climbing stairs?: Yes Weakness of Legs: Both Weakness of Arms/Hands: Both  Permission  Sought/Granted Permission sought to share information with : Case Manager, Family Supports, Other (comment) Permission granted to share information with : Yes,  Verbal Permission Granted  Share Information with NAME: Thomes Dinning  Permission granted to share info w AGENCY: [facility]  Permission granted to share info w Relationship: significant other  Permission granted to share info w Contact Information: [phone]  Emotional Assessment Appearance:: Appears stated age Attitude/Demeanor/Rapport: Engaged Affect (typically observed): Accepting Orientation: : Oriented to Self, Oriented to Place, Oriented to  Time, Oriented to Situation Alcohol / Substance Use: Not Applicable Psych Involvement: No (comment)  Admission diagnosis:  SIRS (systemic inflammatory response syndrome) (HCC) [R65.10] Patient Active Problem List   Diagnosis Date Noted   SIRS (systemic inflammatory response syndrome) ([place]) 04/16/2022   Aspiration into airway 04/16/2022   Leucocytosis 03/13/2022   FUO (fever of unknown origin) 03/13/2022   Sepsis ([place]) 03/02/2022   Acute hypoxic respiratory failure ([place]) 03/02/2022   Bilateral pneumonia 03/02/2022   Pulmonary hemorrhage 03/02/2022   Bilateral pulmonary embolism ([place]) 02/24/2022   Tachycardia 02/24/2022   Multiple sclerosis ([place]) 06/26/2021   Abdominal pain, LUQ (left upper quadrant) 06/10/2021   Chronic constipation 06/10/2021   Family history of pancreatic cancer 06/10/2021   Gastroesophageal reflux disease 06/10/2021   Low back pain 12/06/2020   Numbness 09/26/2020   Falls frequently 04/18/2020   Right leg pain 04/18/2020   RLS (restless legs syndrome) 04/18/2020   History of multiple sclerosis ([place]) 02/22/2020   PCP:  [facility]:   [id number] - [place], [place] [address] [place] [address] Phone: [phone] Fax: [phone]     Social Determinants of Health (SDOH) Social History: SDOH Screenings   Food Insecurity: No Food Insecurity (04/17/2022)  Housing: Low Risk  (04/17/2022)  Transportation Needs: No Transportation Needs (04/17/2022)   Utilities: Not At Risk (04/17/2022)  Tobacco Use: Low Risk  (04/17/2022)   SDOH Interventions:     Readmission Risk Interventions     No data to display

## 2022-04-17 NOTE — Progress Notes (Addendum)
Progress Note    [name]  IOX:[id number] DOB: 01-Feb-1961  DOA: 04/16/2022 PCP: [facility]      Brief Narrative:    Medical records reviewed and are as summarized below:  [name] is a 62 y.o. female with medical history significant for hypertension, depression, anxiety, restless leg syndrome, GERD, multiple sclerosis, history of COVID-19 infection in November 2023, bilateral pulmonary embolism and bilateral lower extremity DVT diagnosed in November 2023, s/p pulmonary thrombectomy on 02/25/2022, recent discharge from the hospital on 03/20/2022 to [facility] after hospitalization 02/24/2022 for acute PE, aspiration pneumonia, sepsis, respiratory failure and metabolic encephalopathy.  She said she stayed at [facility] for about 2 weeks.  She presented to the hospital because of fever.  She was actually referred from [facility] to the emergency department.  She complained of generalized weakness, fatigue, chills and a wound on her buttock.  Her significant other had been applying Desitin cream but it did not seem to help. She was hypoxic with oxygen saturation of 80%, tachycardic with heart rate of 122, febrile with temperature of 101 F at the PCPs office.    Lactic acid was 3.3 on admission.  She was admitted to the hospital for severe sepsis secondary to acute UTI and sacral cellulitis.  She also had acute hypoxic respiratory failure.      Assessment/Plan:   Principal Problem:   Severe sepsis (HCC) Active Problems:   Acute UTI   Cellulitis of sacral region   Bilateral pulmonary embolism (HCC)   Tachycardia   Aspiration into airway   Multiple sclerosis (HCC)   Chronic constipation    Body mass index is 25.24 kg/m.   Severe sepsis secondary to acute UTI, sacral cellulitis: Continue empiric IV antibiotics.  Discontinue IV fluids to avoid fluid overload  Hypotension: Likely from severe sepsis.  Discontinue IV  fluids to avoid fluid overload.  Hypokalemia: Replete potassium and monitor levels  Aspiration into airway: Repeat chest x-ray.  Recent bilateral pulmonary embolism and lower extremity DVT in November 2023: Continue Xarelto.  She Complained of dry mouth from Eliquis and wants to try Xarelto.  Unstageable decubitus ulcers on sacrum and bilateral buttocks: Continue local wound care.  Appreciate input from wound care nurse.  Multiple sclerosis with paraplegia, wheelchair-bound: Continue dalfampridine  Patient is on propranolol for history of sinus tachycardia.  This may have to be held because of tenuous blood pressure.     Diet Order             Diet regular Room service appropriate? Yes with Assist; Fluid consistency: Thin  Diet effective now                            Consultants: None  Procedures: None    Medications:    dalfampridine  10 mg Oral Q12H   DULoxetine  30 mg Oral Daily   gabapentin  400 mg Oral BID   leptospermum manuka honey  1 Application Topical Daily   polyethylene glycol  17 g Oral Daily   pregabalin  75 mg Oral TID   propranolol  20 mg Oral BID   rivaroxaban  20 mg Oral Q supper   senna-docusate  1 tablet Oral Daily   sodium chloride flush  3 mL Intravenous Q12H   Continuous Infusions:  ceFEPime (MAXIPIME) IV Stopped (04/17/22 0557)   lactated ringers 125 mL/hr at 04/17/22 1154   metronidazole Stopped (04/17/22  1105)   vancomycin       Anti-infectives (From admission, onward)    Start     Dose/Rate Route Frequency Ordered Stop   04/17/22 1800  vancomycin (VANCOREADY) IVPB 1250 mg/250 mL        1,250 mg 166.7 mL/hr over 90 Minutes Intravenous Every 24 hours 04/16/22 1948     04/16/22 2200  ceFEPIme (MAXIPIME) 2 g in sodium chloride 0.9 % 100 mL IVPB        2 g 200 mL/hr over 30 Minutes Intravenous Every 8 hours 04/16/22 1948     04/16/22 2000  metroNIDAZOLE (FLAGYL) IVPB 500 mg        500 mg 100 mL/hr over 60 Minutes  Intravenous Every 12 hours 04/16/22 1909     04/16/22 1730  vancomycin (VANCOREADY) IVPB 1250 mg/250 mL        1,250 mg 166.7 mL/hr over 90 Minutes Intravenous  Once 04/16/22 1647 04/16/22 1951   04/16/22 1700  ceFEPIme (MAXIPIME) 1 g in sodium chloride 0.9 % 100 mL IVPB  Status:  Discontinued        1 g 200 mL/hr over 30 Minutes Intravenous Every 8 hours 04/16/22 1645 04/16/22 1948              Family Communication/Anticipated D/C date and plan/Code Status   DVT prophylaxis:  rivaroxaban (XARELTO) tablet 20 mg     Code Status: Full Code  Family Communication: Plan discussed with his significant other at the bedside Disposition Plan: Plan to discharge home in 3 to 5 days   Status is: Inpatient Remains inpatient appropriate because: Severe sepsis       Subjective:   Interval events noted.  She has a wound on buttocks.  No shortness of breath or chest pain.  Her boyfriend was at the bedside.  Objective:    Vitals:   04/17/22 0800 04/17/22 0930 04/17/22 1143 04/17/22 1230  BP: (!) 106/50 (!) 114/48 (!) 84/52 104/61  Pulse: (!) 122 (!) 128 (!) 118 (!) 121  Resp: (!) 27 (!) 26 (!) 22 (!) 25  Temp:  98.2 F (36.8 C) 97.9 F (36.6 C)   TempSrc:  Oral    SpO2: 91% 94% 100% 100%  Weight:      Height:       No data found.   Intake/Output Summary (Last 24 hours) at 04/17/2022 1342 Last data filed at 04/17/2022 1105 Gross per 24 hour  Intake 2150 ml  Output --  Net 2150 ml   Filed Weights   04/16/22 1311  Weight: 62.6 kg    Exam:  GEN: NAD SKIN: Unstageable sacral and bilateral buttock decubitus ulcers EYES: No pallor or icterus ENT: MMM CV: RRR PULM: Bibasilar rales, no wheezing ABD: soft, ND, NT, +BS CNS: AAO x 3, paraplegic but moves both upper extremities spontaneously EXT: No edema or tenderness          Pressure Injury 04/17/22 Buttocks Mid;Right;Left Unstageable - Full thickness tissue loss in which the base of the injury is covered  by slough (yellow, tan, gray, green or brown) and/or eschar (tan, brown or black) in the wound bed. Larger area of thicke (Active)  04/17/22 0014  Location: Buttocks  Location Orientation: Mid;Right;Left  Staging: Unstageable - Full thickness tissue loss in which the base of the injury is covered by slough (yellow, tan, gray, green or brown) and/or eschar (tan, brown or black) in the wound bed.  Wound Description (Comments): Larger area of thickened skin with  pressure ulcer in the middle  Present on Admission: Yes     Data Reviewed:   I have personally reviewed following labs and imaging studies:  Labs: Labs show the following:   Basic Metabolic Panel: Recent Labs  Lab 04/16/22 1307 04/17/22 0410  NA 134* 134*  K 4.5 3.2*  CL 97* 106  CO2 22 22  GLUCOSE 92 98  BUN 10 10  CREATININE 0.50 0.52  CALCIUM 9.1 7.5*   GFR Estimated Creatinine Clearance: 65.9 mL/min (by C-G formula based on SCr of 0.52 mg/dL). Liver Function Tests: Recent Labs  Lab 04/16/22 1307 04/17/22 0410  AST 30 13*  ALT 23 16  ALKPHOS 184* 127*  BILITOT 0.7 1.0  PROT 7.2 5.1*  ALBUMIN 3.1* 2.1*   No results for input(s): "LIPASE", "AMYLASE" in the last 168 hours. No results for input(s): "AMMONIA" in the last 168 hours. Coagulation profile No results for input(s): "INR", "PROTIME" in the last 168 hours.  CBC: Recent Labs  Lab 04/16/22 1307 04/17/22 0410  WBC 29.1* 17.4*  NEUTROABS  --  14.5*  HGB 10.5* 7.0*  HCT 35.4* 22.9*  MCV 91.5 88.4  PLT 374 265   Cardiac Enzymes: No results for input(s): "CKTOTAL", "CKMB", "CKMBINDEX", "TROPONINI" in the last 168 hours. BNP (last 3 results) No results for input(s): "PROBNP" in the last 8760 hours. CBG: No results for input(s): "GLUCAP" in the last 168 hours. D-Dimer: No results for input(s): "DDIMER" in the last 72 hours. Hgb A1c: No results for input(s): "HGBA1C" in the last 72 hours. Lipid Profile: No results for input(s): "CHOL", "HDL",  "LDLCALC", "TRIG", "CHOLHDL", "LDLDIRECT" in the last 72 hours. Thyroid function studies: No results for input(s): "TSH", "T4TOTAL", "T3FREE", "THYROIDAB" in the last 72 hours.  Invalid input(s): "FREET3" Anemia work up: No results for input(s): "VITAMINB12", "FOLATE", "FERRITIN", "TIBC", "IRON", "RETICCTPCT" in the last 72 hours. Sepsis Labs: Recent Labs  Lab 04/16/22 1307 04/16/22 1527 04/17/22 0410  PROCALCITON 0.58  --  1.25  WBC 29.1*  --  17.4*  LATICACIDVEN 3.3* 2.6*  --     Microbiology Recent Results (from the past 240 hour(s))  Blood culture (routine x 2)     Status: None (Preliminary result)   Collection Time: 04/16/22  1:07 PM   Specimen: BLOOD  Result Value Ref Range Status   Specimen Description BLOOD BLOOD RIGHT ARM  Final   Special Requests   Final    BOTTLES DRAWN AEROBIC AND ANAEROBIC Blood Culture adequate volume   Culture   Final    NO GROWTH < 24 HOURS Performed at [facility], [address], [place], [place] [address]    Report Status PENDING  Incomplete  Resp panel by RT-PCR (RSV, Flu A&B, Covid) Anterior Nasal Swab     Status: None   Collection Time: 04/16/22  1:07 PM   Specimen: Anterior Nasal Swab  Result Value Ref Range Status   SARS Coronavirus 2 by RT PCR NEGATIVE NEGATIVE Final    Comment: (NOTE) SARS-CoV-2 target nucleic acids are NOT DETECTED.  The SARS-CoV-2 RNA is generally detectable in upper respiratory specimens during the acute phase of infection. The lowest concentration of SARS-CoV-2 viral copies this assay can detect is 138 copies/mL. A negative result does not preclude SARS-Cov-2 infection and should not be used as the sole basis for treatment or other patient management decisions. A negative result may occur with  improper specimen collection/handling, submission of specimen other than nasopharyngeal swab, presence of viral mutation(s) within  the areas targeted by this assay, and inadequate number of  viral copies(<138 copies/mL). A negative result must be combined with clinical observations, patient history, and epidemiological information. The expected result is Negative.  Fact Sheet for Patients:  BloggerCourse.com  Fact Sheet for Healthcare Providers:  SeriousBroker.it  This test is no t yet approved or cleared by the [place] FDA and  has been authorized for detection and/or diagnosis of SARS-CoV-2 by FDA under an Emergency Use Authorization (EUA). This EUA will remain  in effect (meaning this test can be used) for the duration of the COVID-19 declaration under Section 564(b)(1) of the Act, 21 U.S.C.section 360bbb-3(b)(1), unless the authorization is terminated  or revoked sooner.       Influenza A by PCR NEGATIVE NEGATIVE Final   Influenza B by PCR NEGATIVE NEGATIVE Final    Comment: (NOTE) The Xpert Xpress SARS-CoV-2/FLU/RSV plus assay is intended as an aid in the diagnosis of influenza from Nasopharyngeal swab specimens and should not be used as a sole basis for treatment. Nasal washings and aspirates are unacceptable for Xpert Xpress SARS-CoV-2/FLU/RSV testing.  Fact Sheet for Patients: BloggerCourse.com  Fact Sheet for Healthcare Providers: SeriousBroker.it  This test is not yet approved or cleared by the [place] FDA and has been authorized for detection and/or diagnosis of SARS-CoV-2 by FDA under an Emergency Use Authorization (EUA). This EUA will remain in effect (meaning this test can be used) for the duration of the COVID-19 declaration under Section 564(b)(1) of the Act, 21 U.S.C. section 360bbb-3(b)(1), unless the authorization is terminated or revoked.     Resp Syncytial Virus by PCR NEGATIVE NEGATIVE Final    Comment: (NOTE) Fact Sheet for Patients: BloggerCourse.com  Fact Sheet for Healthcare  Providers: SeriousBroker.it  This test is not yet approved or cleared by the [place] FDA and has been authorized for detection and/or diagnosis of SARS-CoV-2 by FDA under an Emergency Use Authorization (EUA). This EUA will remain in effect (meaning this test can be used) for the duration of the COVID-19 declaration under Section 564(b)(1) of the Act, 21 U.S.C. section 360bbb-3(b)(1), unless the authorization is terminated or revoked.  Performed at [facility], [address] [place], [place] [address]   Blood culture (routine x 2)     Status: None (Preliminary result)   Collection Time: 04/16/22  1:12 PM   Specimen: BLOOD  Result Value Ref Range Status   Specimen Description BLOOD BLOOD LEFT ARM  Final   Special Requests   Final    BOTTLES DRAWN AEROBIC AND ANAEROBIC Blood Culture adequate volume   Culture   Final    NO GROWTH < 24 HOURS Performed at [facility], [address], [place], [place] [address]    Report Status PENDING  Incomplete    Procedures and diagnostic studies:  DG Chest 2 View  Result Date: 04/16/2022 CLINICAL DATA:  Fever EXAM: CHEST - 2 VIEW COMPARISON:  Radiograph 03/19/2022 FINDINGS: There is overall improved airspace disease in the lungs with probable residual scarring and bandlike atelectasis bilaterally. There is no definite new airspace disease. There is no pleural effusion or evidence of pneumothorax. Cardiomediastinal silhouette is unchanged. Bones are unchanged. IMPRESSION: Improved airspace disease in comparison to recent exams, with residual scarring and bandlike atelectasis bilaterally. No definite new airspace disease. Electronically Signed   By: Caprice Renshaw M.D.   On: 04/16/2022 16:21   CT CHEST ABDOMEN PELVIS W CONTRAST  Result Date: 04/16/2022 CLINICAL DATA:  Sepsis.  Fever.  EXAM: CT CHEST, ABDOMEN, AND PELVIS WITH CONTRAST TECHNIQUE: Multidetector CT imaging of the chest, abdomen and pelvis  was performed following the standard protocol during bolus administration of intravenous contrast. RADIATION DOSE REDUCTION: This exam was performed according to the departmental dose-optimization program which includes automated exposure control, adjustment of the mA and/or kV according to patient size and/or use of iterative reconstruction technique. CONTRAST:  OMNIPAQUE IOHEXOL 300 MG/ML  SOLN COMPARISON:  Chest radiographs same date. CT of the chest, abdomen and pelvis 03/19/2022 and 03/03/2022. FINDINGS: CT CHEST FINDINGS Cardiovascular: No acute vascular findings. No significant atherosclerosis demonstrated. The heart size is normal. There is no pericardial effusion. Mediastinum/Nodes: There are no enlarged mediastinal, hilar or axillary lymph nodes. Posterior left thyroid nodule again noted measuring up to 1.8 cm in diameter. If not previously performed, recommend thyroid ultrasound. (Ref: J Am Coll Radiol. 2015 Feb;12(2): 143-50). Enteric tube has been removed in the interval. There is a moderate size hiatal hernia. Lungs/Pleura: No pleural effusion or pneumothorax. Previously demonstrated diffuse bilateral airspace opacities have significantly improved in the interval. There are scattered small areas of residual inflammation or postinflammatory scarring dependently in both upper and lower lobes. No new pulmonary findings. Musculoskeletal/Chest wall: No chest wall mass or suspicious osseous findings. CT ABDOMEN AND PELVIS FINDINGS Hepatobiliary: The liver is normal in density without suspicious focal abnormality. No evidence of gallstones, gallbladder wall thickening or biliary dilatation. Pancreas: Unremarkable. No pancreatic ductal dilatation or surrounding inflammatory changes. Spleen: Normal in size without focal abnormality. Adrenals/Urinary Tract: Both adrenal glands appear normal. No evidence of urinary tract calculus, suspicious renal lesion or hydronephrosis. A small amount of air is noted non  dependently within the bladder lumen. Previously demonstrated hyperdense focus dependently on the right is no longer seen. There is no bladder wall thickening. Stomach/Bowel: No enteric contrast administered. As above, moderate-size hiatal hernia. The stomach otherwise appears unremarkable for its degree of distention. No evidence of bowel wall thickening, distention or surrounding inflammation. The appendix appears normal. There is moderate stool throughout the colon. There is new presacral soft tissue stranding which appears non circumferential and without associated abnormality of the rectum. Vascular/Lymphatic: There are no enlarged abdominal or pelvic lymph nodes. No significant vascular findings. Reproductive: The uterus and ovaries appear stable. Probable small posterior uterine fundal fibroid. No adnexal mass. Other: As above, new presacral soft tissue stranding without focal fluid collection, foreign body or soft tissue emphysema. No ascites or free air. Musculoskeletal: Apparent decubitus ulceration over the distal sacrum and coccyx with adjacent subcutaneous soft tissue stranding which may account for the pre sacral edema. No evidence of osteomyelitis. No evidence of acute fracture or dislocation. Mild facet hypertrophy noted. There is asymmetric fluid lateral to the right greater trochanter which appears similar to previous study, likely reflecting trochanteric bursitis. The soft tissues lateral to the hips are incompletely visualized on this examination. IMPRESSION: 1. Interval improvement in previously demonstrated diffuse bilateral airspace opacities consistent with resolving pneumonia. There are scattered small areas of residual inflammation or postinflammatory scarring dependently in both upper and lower lobes. No new pulmonary findings. 2. Apparent decubitus ulceration over the distal sacrum and coccyx with adjacent subcutaneous soft tissue stranding. No evidence of osteomyelitis or abscess. 3. New  presacral soft tissue stranding without associated abnormality of the rectum. This is nonspecific and could be inflammatory or posttraumatic. 4. Small amount of air non dependently within the bladder lumen, likely iatrogenic. Previously demonstrated hyperdense focus dependently on the right is no longer seen.  5. Moderate-size hiatal hernia. 6. Stable asymmetric fluid lateral to the right greater trochanter, likely reflecting trochanteric bursitis. 7. Stable left thyroid nodule. If not previously performed, recommend thyroid ultrasound. Electronically Signed   By: Carey Bullocks M.D.   On: 04/16/2022 16:19               LOS: 1 day   Chetan Mehring  [facility]   Pager on [email or web address]. If 7PM-7AM, please contact night-coverage at [email or web address]     04/17/2022, 1:42 PM

## 2022-04-17 NOTE — ED Notes (Signed)
Per provider on secure chat about hr, pt's hr typically runs high, no new orders at this time

## 2022-04-17 NOTE — ED Notes (Signed)
Pt changed, dressing applied to sacral area

## 2022-04-18 DIAGNOSIS — N390 Urinary tract infection, site not specified: Secondary | ICD-10-CM | POA: Diagnosis not present

## 2022-04-18 DIAGNOSIS — A419 Sepsis, unspecified organism: Secondary | ICD-10-CM | POA: Diagnosis not present

## 2022-04-18 DIAGNOSIS — I959 Hypotension, unspecified: Secondary | ICD-10-CM | POA: Diagnosis not present

## 2022-04-18 DIAGNOSIS — L03319 Cellulitis of trunk, unspecified: Secondary | ICD-10-CM | POA: Diagnosis not present

## 2022-04-18 DIAGNOSIS — G35 Multiple sclerosis: Secondary | ICD-10-CM | POA: Diagnosis not present

## 2022-04-18 LAB — COMPREHENSIVE METABOLIC PANEL
ALT: 16 U/L (ref 0–44)
AST: 12 U/L — ABNORMAL LOW (ref 15–41)
Albumin: 2.3 g/dL — ABNORMAL LOW (ref 3.5–5.0)
Alkaline Phosphatase: 142 U/L — ABNORMAL HIGH (ref 38–126)
Anion gap: 7 (ref 5–15)
BUN: 6 mg/dL (ref 6–20)
CO2: 22 mmol/L (ref 22–32)
Calcium: 8.1 mg/dL — ABNORMAL LOW (ref 8.9–10.3)
Chloride: 105 mmol/L (ref 98–111)
Creatinine, Ser: 0.41 mg/dL — ABNORMAL LOW (ref 0.44–1.00)
GFR, Estimated: >60 mL/min (ref >60)
Glucose, Bld: 99 mg/dL (ref 70–99)
Potassium: 3.9 mmol/L (ref 3.5–5.1)
Sodium: 134 mmol/L — ABNORMAL LOW (ref 135–145)
Total Bilirubin: 0.8 mg/dL (ref 0.3–1.2)
Total Protein: 5.4 g/dL — ABNORMAL LOW (ref 6.5–8.1)

## 2022-04-18 LAB — CBC WITH DIFFERENTIAL/PLATELET
Abs Immature Granulocytes: 1.07 10*3/uL — ABNORMAL HIGH (ref 0.00–0.07)
Basophils Absolute: 0 10*3/uL (ref 0.0–0.1)
Basophils Relative: 0 %
Eosinophils Absolute: 0 10*3/uL (ref 0.0–0.5)
Eosinophils Relative: 0 %
HCT: 24.6 % — ABNORMAL LOW (ref 36.0–46.0)
Hemoglobin: 7.6 g/dL — ABNORMAL LOW (ref 12.0–15.0)
Immature Granulocytes: 6 %
Lymphocytes Relative: 3 %
Lymphs Abs: 0.5 10*3/uL — ABNORMAL LOW (ref 0.7–4.0)
MCH: 27 pg (ref 26.0–34.0)
MCHC: 30.9 g/dL (ref 30.0–36.0)
MCV: 87.2 fL (ref 80.0–100.0)
Monocytes Absolute: 1.2 10*3/uL — ABNORMAL HIGH (ref 0.1–1.0)
Monocytes Relative: 7 %
Neutro Abs: 14.8 10*3/uL — ABNORMAL HIGH (ref 1.7–7.7)
Neutrophils Relative %: 84 %
Platelets: 285 10*3/uL (ref 150–400)
RBC: 2.82 MIL/uL — ABNORMAL LOW (ref 3.87–5.11)
RDW: 18.1 % — ABNORMAL HIGH (ref 11.5–15.5)
WBC: 17.6 10*3/uL — ABNORMAL HIGH (ref 4.0–10.5)
nRBC: 0 % (ref 0.0–0.2)

## 2022-04-18 LAB — PROCALCITONIN: Procalcitonin: 1.01 ng/mL

## 2022-04-18 LAB — LACTIC ACID, PLASMA: Lactic Acid, Venous: 0.7 mmol/L (ref 0.5–1.9)

## 2022-04-18 MED ORDER — SODIUM CHLORIDE 0.9 % IV SOLN
2.0000 g | Freq: Three times a day (TID) | INTRAVENOUS | Status: DC
  Administered 2022-04-18 – 2022-04-21 (×9): 2 g via INTRAVENOUS
  Filled 2022-04-18: qty 2
  Filled 2022-04-18 (×9): qty 12.5

## 2022-04-18 MED ORDER — MIDODRINE HCL 5 MG PO TABS
5.0000 mg | ORAL_TABLET | Freq: Three times a day (TID) | ORAL | Status: DC
  Administered 2022-04-18 – 2022-04-21 (×10): 5 mg via ORAL
  Filled 2022-04-18 (×10): qty 1

## 2022-04-18 NOTE — Progress Notes (Addendum)
Progress Note    [name]  ELF:[id number] DOB: 1960-09-23  DOA: 04/16/2022 PCP: [facility]      Brief Narrative:    Medical records reviewed and are as summarized below:  [name] is a 62 y.o. female with medical history significant for hypertension, depression, anxiety, restless leg syndrome, GERD, multiple sclerosis, history of COVID-19 infection in November 2023, bilateral pulmonary embolism and bilateral lower extremity DVT diagnosed in November 2023, s/p pulmonary thrombectomy on 02/25/2022, recent discharge from the hospital on 03/20/2022 to [facility] after hospitalization 02/24/2022 for acute PE, aspiration pneumonia, sepsis, respiratory failure and metabolic encephalopathy.  She said she stayed at [facility] for about 2 weeks.  She presented to the hospital because of fever.  She was actually referred from [facility] clinic to the emergency department.  She complained of generalized weakness, fatigue, chills and a wound on her buttock.  Her significant other had been applying Desitin cream but it did not seem to help. She was hypoxic with oxygen saturation of 80%, tachycardic with heart rate of 122, febrile with temperature of 101 F at the PCPs office.    Lactic acid was 3.3 on admission.  She was admitted to the hospital for severe sepsis secondary to acute UTI and sacral cellulitis.  She also had acute hypoxic respiratory failure.      Assessment/Plan:   Principal Problem:   Severe sepsis (HCC) Active Problems:   Acute UTI   Cellulitis of sacral region   Bilateral pulmonary embolism (HCC)   Tachycardia   Aspiration into airway   Multiple sclerosis (HCC)   Chronic constipation   Hypotension    Body mass index is 25.24 kg/m.   Severe sepsis secondary to acute UTI, sacral cellulitis: Continue IV cefepime, Flagyl and vancomycin.  Follow-up urine and blood cultures.    Hypotension: Start midodrine.   Discontinue propranolol.  Hypokalemia: Improved  Aspiration into airway: Repeat chest x-ray on 04/18/2022 showed resolving multilobar pneumonia.  She is on empiric antibiotics.  Recent bilateral pulmonary embolism and lower extremity DVT in November 2023: Continue Xarelto.  She Complained of dry mouth from Eliquis and wants to try Xarelto.  Unstageable decubitus ulcers on sacrum and bilateral buttocks: Continue local wound care.  Appreciate input from wound care nurse.  Multiple sclerosis with paraplegia, wheelchair-bound: Continue dalfampridine  Sinus tachycardia: Patient said she has chronic sinus tachycardia for which she takes propranolol.  Propranolol held because of hypotension.     Diet Order             Diet regular Room service appropriate? Yes with Assist; Fluid consistency: Thin  Diet effective now                            Consultants: None  Procedures: None    Medications:    dalfampridine  10 mg Oral Q12H   DULoxetine  30 mg Oral Daily   gabapentin  400 mg Oral BID   leptospermum manuka honey  1 Application Topical Daily   midodrine  5 mg Oral TID WC   polyethylene glycol  17 g Oral Daily   pregabalin  75 mg Oral TID   rivaroxaban  20 mg Oral Q supper   senna-docusate  1 tablet Oral Daily   sodium chloride flush  3 mL Intravenous Q12H   Continuous Infusions:  ceFEPime (MAXIPIME) IV Stopped (04/18/22 5852)   metronidazole Stopped (04/18/22 1029)  vancomycin Stopped (04/17/22 2027)     Anti-infectives (From admission, onward)    Start     Dose/Rate Route Frequency Ordered Stop   04/17/22 1800  vancomycin (VANCOREADY) IVPB 1250 mg/250 mL        1,250 mg 166.7 mL/hr over 90 Minutes Intravenous Every 24 hours 04/16/22 1948     04/16/22 2200  ceFEPIme (MAXIPIME) 2 g in sodium chloride 0.9 % 100 mL IVPB        2 g 200 mL/hr over 30 Minutes Intravenous Every 8 hours 04/16/22 1948     04/16/22 2000  metroNIDAZOLE (FLAGYL) IVPB 500 mg         500 mg 100 mL/hr over 60 Minutes Intravenous Every 12 hours 04/16/22 1909     04/16/22 1730  vancomycin (VANCOREADY) IVPB 1250 mg/250 mL        1,250 mg 166.7 mL/hr over 90 Minutes Intravenous  Once 04/16/22 1647 04/16/22 1951   04/16/22 1700  ceFEPIme (MAXIPIME) 1 g in sodium chloride 0.9 % 100 mL IVPB  Status:  Discontinued        1 g 200 mL/hr over 30 Minutes Intravenous Every 8 hours 04/16/22 1645 04/16/22 1948              Family Communication/Anticipated D/C date and plan/Code Status   DVT prophylaxis:  rivaroxaban (XARELTO) tablet 20 mg     Code Status: Full Code  Family Communication: Plan discussed with his significant other at the bedside Disposition Plan: Plan to discharge home in 3 to 5 days   Status is: Inpatient Remains inpatient appropriate because: Severe sepsis       Subjective:   Interval events noted.  She has no complaints.  She is tachycardic on the monitor but she says that her heart rate has always been elevated and that is why she takes propranolol.  Her significant other was at the bedside.  Objective:    Vitals:   04/18/22 0900 04/18/22 1000 04/18/22 1100 04/18/22 1145  BP: (!) 107/56 (!) 108/57 (!) 93/49   Pulse: (!) 134 (!) 134 (!) 125 (!) 119  Resp: (!) 26 (!) 29 (!) 27 (!) 25  Temp:    98.1 F (36.7 C)  TempSrc:    Oral  SpO2: 93% 97% 98% 100%  Weight:      Height:       No data found.   Intake/Output Summary (Last 24 hours) at 04/18/2022 1358 Last data filed at 04/18/2022 0655 Gross per 24 hour  Intake 650 ml  Output 1 ml  Net 649 ml   Filed Weights   04/16/22 1311  Weight: 62.6 kg    Exam:  GEN: NAD SKIN: Warm and dry.  Unstageable sacral and bilateral buttock decubitus ulcers EYES: EOMI ENT: MMM CV: RRR PULM: Bibasilar rales, no wheezing ABD: soft, ND, NT, +BS CNS: AAO x 3, paraplegia EXT: Trace bilateral leg edema, no tenderness       Pressure Injury 04/17/22 Buttocks Mid;Right;Left Unstageable -  Full thickness tissue loss in which the base of the injury is covered by slough (yellow, tan, gray, green or brown) and/or eschar (tan, brown or black) in the wound bed. Larger area of thicke (Active)  04/17/22 0014  Location: Buttocks  Location Orientation: Mid;Right;Left  Staging: Unstageable - Full thickness tissue loss in which the base of the injury is covered by slough (yellow, tan, gray, green or brown) and/or eschar (tan, brown or black) in the wound bed.  Wound Description (Comments):  Larger area of thickened skin with pressure ulcer in the middle  Present on Admission: Yes     Data Reviewed:   I have personally reviewed following labs and imaging studies:  Labs: Labs show the following:   Basic Metabolic Panel: Recent Labs  Lab 04/16/22 1307 04/17/22 0410 04/17/22 2035 04/18/22 0440  NA 134* 134*  --  134*  K 4.5 3.2* 3.9 3.9  CL 97* 106  --  105  CO2 22 22  --  22  GLUCOSE 92 98  --  99  BUN 10 10  --  6  CREATININE 0.50 0.52  --  0.41*  CALCIUM 9.1 7.5*  --  8.1*  MG  --   --  1.9  --    GFR Estimated Creatinine Clearance: 65.9 mL/min (A) (by C-G formula based on SCr of 0.41 mg/dL (L)). Liver Function Tests: Recent Labs  Lab 04/16/22 1307 04/17/22 0410 04/18/22 0440  AST 30 13* 12*  ALT 23 16 16   ALKPHOS 184* 127* 142*  BILITOT 0.7 1.0 0.8  PROT 7.2 5.1* 5.4*  ALBUMIN 3.1* 2.1* 2.3*   No results for input(s): "LIPASE", "AMYLASE" in the last 168 hours. No results for input(s): "AMMONIA" in the last 168 hours. Coagulation profile No results for input(s): "INR", "PROTIME" in the last 168 hours.  CBC: Recent Labs  Lab 04/16/22 1307 04/17/22 0410 04/17/22 2035 04/18/22 0440  WBC 29.1* 17.4*  --  17.6*  NEUTROABS  --  14.5*  --  14.8*  HGB 10.5* 7.0* 8.0* 7.6*  HCT 35.4* 22.9* 27.3* 24.6*  MCV 91.5 88.4  --  87.2  PLT 374 265  --  285   Cardiac Enzymes: No results for input(s): "CKTOTAL", "CKMB", "CKMBINDEX", "TROPONINI" in the last 168  hours. BNP (last 3 results) No results for input(s): "PROBNP" in the last 8760 hours. CBG: No results for input(s): "GLUCAP" in the last 168 hours. D-Dimer: No results for input(s): "DDIMER" in the last 72 hours. Hgb A1c: No results for input(s): "HGBA1C" in the last 72 hours. Lipid Profile: No results for input(s): "CHOL", "HDL", "LDLCALC", "TRIG", "CHOLHDL", "LDLDIRECT" in the last 72 hours. Thyroid function studies: No results for input(s): "TSH", "T4TOTAL", "T3FREE", "THYROIDAB" in the last 72 hours.  Invalid input(s): "FREET3" Anemia work up: No results for input(s): "VITAMINB12", "FOLATE", "FERRITIN", "TIBC", "IRON", "RETICCTPCT" in the last 72 hours. Sepsis Labs: Recent Labs  Lab 04/16/22 1307 04/16/22 1527 04/17/22 0410 04/18/22 0440  PROCALCITON 0.58  --  1.25 1.01  WBC 29.1*  --  17.4* 17.6*  LATICACIDVEN 3.3* 2.6*  --  0.7    Microbiology Recent Results (from the past 240 hour(s))  Blood culture (routine x 2)     Status: None (Preliminary result)   Collection Time: 04/16/22  1:07 PM   Specimen: BLOOD  Result Value Ref Range Status   Specimen Description BLOOD BLOOD RIGHT ARM  Final   Special Requests   Final    BOTTLES DRAWN AEROBIC AND ANAEROBIC Blood Culture adequate volume   Culture   Final    NO GROWTH 2 DAYS Performed at [facility], [address] [place], [place]    Report Status PENDING  Incomplete  Resp panel by RT-PCR (RSV, Flu A&B, Covid) Anterior Nasal Swab     Status: None   Collection Time: 04/16/22  1:07 PM   Specimen: Anterior Nasal Swab  Result Value Ref Range Status   SARS Coronavirus 2 by RT PCR NEGATIVE NEGATIVE  Final    Comment: (NOTE) SARS-CoV-2 target nucleic acids are NOT DETECTED.  The SARS-CoV-2 RNA is generally detectable in upper respiratory specimens during the acute phase of infection. The lowest concentration of SARS-CoV-2 viral copies this assay can detect is 138 copies/mL. A negative result does  not preclude SARS-Cov-2 infection and should not be used as the sole basis for treatment or other patient management decisions. A negative result may occur with  improper specimen collection/handling, submission of specimen other than nasopharyngeal swab, presence of viral mutation(s) within the areas targeted by this assay, and inadequate number of viral copies(<138 copies/mL). A negative result must be combined with clinical observations, patient history, and epidemiological information. The expected result is Negative.  Fact Sheet for Patients:  BloggerCourse.com  Fact Sheet for Healthcare Providers:  SeriousBroker.it  This test is no t yet approved or cleared by the [place] FDA and  has been authorized for detection and/or diagnosis of SARS-CoV-2 by FDA under an Emergency Use Authorization (EUA). This EUA will remain  in effect (meaning this test can be used) for the duration of the COVID-19 declaration under Section 564(b)(1) of the Act, 21 U.S.C.section 360bbb-3(b)(1), unless the authorization is terminated  or revoked sooner.       Influenza A by PCR NEGATIVE NEGATIVE Final   Influenza B by PCR NEGATIVE NEGATIVE Final    Comment: (NOTE) The Xpert Xpress SARS-CoV-2/FLU/RSV plus assay is intended as an aid in the diagnosis of influenza from Nasopharyngeal swab specimens and should not be used as a sole basis for treatment. Nasal washings and aspirates are unacceptable for Xpert Xpress SARS-CoV-2/FLU/RSV testing.  Fact Sheet for Patients: BloggerCourse.com  Fact Sheet for Healthcare Providers: SeriousBroker.it  This test is not yet approved or cleared by the [place] FDA and has been authorized for detection and/or diagnosis of SARS-CoV-2 by FDA under an Emergency Use Authorization (EUA). This EUA will remain in effect (meaning this test can be used) for the  duration of the COVID-19 declaration under Section 564(b)(1) of the Act, 21 U.S.C. section 360bbb-3(b)(1), unless the authorization is terminated or revoked.     Resp Syncytial Virus by PCR NEGATIVE NEGATIVE Final    Comment: (NOTE) Fact Sheet for Patients: BloggerCourse.com  Fact Sheet for Healthcare Providers: SeriousBroker.it  This test is not yet approved or cleared by the [place] FDA and has been authorized for detection and/or diagnosis of SARS-CoV-2 by FDA under an Emergency Use Authorization (EUA). This EUA will remain in effect (meaning this test can be used) for the duration of the COVID-19 declaration under Section 564(b)(1) of the Act, 21 U.S.C. section 360bbb-3(b)(1), unless the authorization is terminated or revoked.  Performed at [facility], [address] [place], [place] [address]   Blood culture (routine x 2)     Status: None (Preliminary result)   Collection Time: 04/16/22  1:12 PM   Specimen: BLOOD  Result Value Ref Range Status   Specimen Description BLOOD BLOOD LEFT ARM  Final   Special Requests   Final    BOTTLES DRAWN AEROBIC AND ANAEROBIC Blood Culture adequate volume   Culture   Final    NO GROWTH 2 DAYS Performed at [facility], [address], [place], [place] [address]    Report Status PENDING  Incomplete    Procedures and diagnostic studies:  DG Chest Port 1 View  Result Date: 04/17/2022 CLINICAL DATA:  Aspiration. EXAM: PORTABLE CHEST 1 VIEW COMPARISON:  04/16/2022 and CT chest 04/16/2022, 03/19/2022.  FINDINGS: Trachea is midline. Heart size normal. Streaky densities in the upper lobes and lung bases, previously seen as airspace consolidation on 03/19/2022. No dense airspace consolidation or pleural fluid. IMPRESSION: Resolving multilobar pneumonia with residual streaky densities bilaterally. Electronically Signed   By: Lorin Picket M.D.   On: 04/17/2022 14:10    DG Chest 2 View  Result Date: 04/16/2022 CLINICAL DATA:  Fever EXAM: CHEST - 2 VIEW COMPARISON:  Radiograph 03/19/2022 FINDINGS: There is overall improved airspace disease in the lungs with probable residual scarring and bandlike atelectasis bilaterally. There is no definite new airspace disease. There is no pleural effusion or evidence of pneumothorax. Cardiomediastinal silhouette is unchanged. Bones are unchanged. IMPRESSION: Improved airspace disease in comparison to recent exams, with residual scarring and bandlike atelectasis bilaterally. No definite new airspace disease. Electronically Signed   By: Maurine Simmering M.D.   On: 04/16/2022 16:21   CT CHEST ABDOMEN PELVIS W CONTRAST  Result Date: 04/16/2022 CLINICAL DATA:  Sepsis.  Fever. EXAM: CT CHEST, ABDOMEN, AND PELVIS WITH CONTRAST TECHNIQUE: Multidetector CT imaging of the chest, abdomen and pelvis was performed following the standard protocol during bolus administration of intravenous contrast. RADIATION DOSE REDUCTION: This exam was performed according to the departmental dose-optimization program which includes automated exposure control, adjustment of the mA and/or kV according to patient size and/or use of iterative reconstruction technique. CONTRAST:  144mL OMNIPAQUE IOHEXOL 300 MG/ML  SOLN COMPARISON:  Chest radiographs same date. CT of the chest, abdomen and pelvis 03/19/2022 and 03/03/2022. FINDINGS: CT CHEST FINDINGS Cardiovascular: No acute vascular findings. No significant atherosclerosis demonstrated. The heart size is normal. There is no pericardial effusion. Mediastinum/Nodes: There are no enlarged mediastinal, hilar or axillary lymph nodes. Posterior left thyroid nodule again noted measuring up to 1.8 cm in diameter. If not previously performed, recommend thyroid ultrasound. (Ref: J Am Coll Radiol. 2015 Feb;12(2): 143-50). Enteric tube has been removed in the interval. There is a moderate size hiatal hernia. Lungs/Pleura: No pleural  effusion or pneumothorax. Previously demonstrated diffuse bilateral airspace opacities have significantly improved in the interval. There are scattered small areas of residual inflammation or postinflammatory scarring dependently in both upper and lower lobes. No new pulmonary findings. Musculoskeletal/Chest wall: No chest wall mass or suspicious osseous findings. CT ABDOMEN AND PELVIS FINDINGS Hepatobiliary: The liver is normal in density without suspicious focal abnormality. No evidence of gallstones, gallbladder wall thickening or biliary dilatation. Pancreas: Unremarkable. No pancreatic ductal dilatation or surrounding inflammatory changes. Spleen: Normal in size without focal abnormality. Adrenals/Urinary Tract: Both adrenal glands appear normal. No evidence of urinary tract calculus, suspicious renal lesion or hydronephrosis. A small amount of air is noted non dependently within the bladder lumen. Previously demonstrated hyperdense focus dependently on the right is no longer seen. There is no bladder wall thickening. Stomach/Bowel: No enteric contrast administered. As above, moderate-size hiatal hernia. The stomach otherwise appears unremarkable for its degree of distention. No evidence of bowel wall thickening, distention or surrounding inflammation. The appendix appears normal. There is moderate stool throughout the colon. There is new presacral soft tissue stranding which appears non circumferential and without associated abnormality of the rectum. Vascular/Lymphatic: There are no enlarged abdominal or pelvic lymph nodes. No significant vascular findings. Reproductive: The uterus and ovaries appear stable. Probable small posterior uterine fundal fibroid. No adnexal mass. Other: As above, new presacral soft tissue stranding without focal fluid collection, foreign body or soft tissue emphysema. No ascites or free air. Musculoskeletal: Apparent decubitus ulceration over the distal sacrum  and coccyx with  adjacent subcutaneous soft tissue stranding which may account for the pre sacral edema. No evidence of osteomyelitis. No evidence of acute fracture or dislocation. Mild facet hypertrophy noted. There is asymmetric fluid lateral to the right greater trochanter which appears similar to previous study, likely reflecting trochanteric bursitis. The soft tissues lateral to the hips are incompletely visualized on this examination. IMPRESSION: 1. Interval improvement in previously demonstrated diffuse bilateral airspace opacities consistent with resolving pneumonia. There are scattered small areas of residual inflammation or postinflammatory scarring dependently in both upper and lower lobes. No new pulmonary findings. 2. Apparent decubitus ulceration over the distal sacrum and coccyx with adjacent subcutaneous soft tissue stranding. No evidence of osteomyelitis or abscess. 3. New presacral soft tissue stranding without associated abnormality of the rectum. This is nonspecific and could be inflammatory or posttraumatic. 4. Small amount of air non dependently within the bladder lumen, likely iatrogenic. Previously demonstrated hyperdense focus dependently on the right is no longer seen. 5. Moderate-size hiatal hernia. 6. Stable asymmetric fluid lateral to the right greater trochanter, likely reflecting trochanteric bursitis. 7. Stable left thyroid nodule. If not previously performed, recommend thyroid ultrasound. Electronically Signed   By: Carey Bullocks M.D.   On: 04/16/2022 16:19               LOS: 2 days   Jalysa Swopes  [facility]   Pager on [email or web address]. If 7PM-7AM, please contact night-coverage at [email or web address]     04/18/2022, 1:58 PM

## 2022-04-19 DIAGNOSIS — R6520 Severe sepsis without septic shock: Secondary | ICD-10-CM | POA: Diagnosis not present

## 2022-04-19 DIAGNOSIS — L03319 Cellulitis of trunk, unspecified: Secondary | ICD-10-CM | POA: Diagnosis not present

## 2022-04-19 DIAGNOSIS — N390 Urinary tract infection, site not specified: Secondary | ICD-10-CM | POA: Diagnosis not present

## 2022-04-19 DIAGNOSIS — A419 Sepsis, unspecified organism: Secondary | ICD-10-CM | POA: Diagnosis not present

## 2022-04-19 LAB — CBC WITH DIFFERENTIAL/PLATELET
Abs Immature Granulocytes: 0.82 10*3/uL — ABNORMAL HIGH (ref 0.00–0.07)
Basophils Absolute: 0 10*3/uL (ref 0.0–0.1)
Basophils Relative: 0 %
Eosinophils Absolute: 0.1 10*3/uL (ref 0.0–0.5)
Eosinophils Relative: 1 %
HCT: 24.5 % — ABNORMAL LOW (ref 36.0–46.0)
Hemoglobin: 7.5 g/dL — ABNORMAL LOW (ref 12.0–15.0)
Immature Granulocytes: 7 %
Lymphocytes Relative: 4 %
Lymphs Abs: 0.4 10*3/uL — ABNORMAL LOW (ref 0.7–4.0)
MCH: 26.2 pg (ref 26.0–34.0)
MCHC: 30.6 g/dL (ref 30.0–36.0)
MCV: 85.7 fL (ref 80.0–100.0)
Monocytes Absolute: 1.2 10*3/uL — ABNORMAL HIGH (ref 0.1–1.0)
Monocytes Relative: 10 %
Neutro Abs: 10.1 10*3/uL — ABNORMAL HIGH (ref 1.7–7.7)
Neutrophils Relative %: 78 %
Platelets: 285 10*3/uL (ref 150–400)
RBC: 2.86 MIL/uL — ABNORMAL LOW (ref 3.87–5.11)
RDW: 18.2 % — ABNORMAL HIGH (ref 11.5–15.5)
WBC: 12.7 10*3/uL — ABNORMAL HIGH (ref 4.0–10.5)
nRBC: 0 % (ref 0.0–0.2)

## 2022-04-19 LAB — BASIC METABOLIC PANEL
Anion gap: 10 (ref 5–15)
BUN: 7 mg/dL (ref 6–20)
CO2: 22 mmol/L (ref 22–32)
Calcium: 8 mg/dL — ABNORMAL LOW (ref 8.9–10.3)
Chloride: 102 mmol/L (ref 98–111)
Creatinine, Ser: 0.46 mg/dL (ref 0.44–1.00)
GFR, Estimated: >60 mL/min (ref >60)
Glucose, Bld: 95 mg/dL (ref 70–99)
Potassium: 3.6 mmol/L (ref 3.5–5.1)
Sodium: 134 mmol/L — ABNORMAL LOW (ref 135–145)

## 2022-04-19 LAB — MAGNESIUM: Magnesium: 1.9 mg/dL (ref 1.7–2.4)

## 2022-04-19 LAB — URINE CULTURE

## 2022-04-19 LAB — PHOSPHORUS: Phosphorus: 3 mg/dL (ref 2.5–4.6)

## 2022-04-19 MED ORDER — PROPRANOLOL HCL 20 MG PO TABS
20.0000 mg | ORAL_TABLET | Freq: Two times a day (BID) | ORAL | Status: DC
  Administered 2022-04-19 – 2022-04-21 (×4): 20 mg via ORAL
  Filled 2022-04-19 (×6): qty 1

## 2022-04-19 NOTE — Progress Notes (Addendum)
Progress Note    [name]  HER:[id number] DOB: Nov 01, 1960  DOA: 04/16/2022 PCP: [facility]      Brief Narrative:    Medical records reviewed and are as summarized below:  [name] is a 62 y.o. female with medical history significant for hypertension, depression, anxiety, restless leg syndrome, GERD, multiple sclerosis, history of COVID-19 infection in November 2023, bilateral pulmonary embolism and bilateral lower extremity DVT diagnosed in November 2023, s/p pulmonary thrombectomy on 02/25/2022, recent discharge from the hospital on 03/20/2022 to [facility] after hospitalization 02/24/2022 for acute PE, aspiration pneumonia, sepsis, respiratory failure and metabolic encephalopathy.  She said she stayed at [facility] for about 2 weeks.  She presented to the hospital because of fever.  She was actually referred from [facility] clinic to the emergency department.  She complained of generalized weakness, fatigue, chills and a wound on her buttock.  Her significant other had been applying Desitin cream but it did not seem to help. She was hypoxic with oxygen saturation of 80%, tachycardic with heart rate of 122, febrile with temperature of 101 F at the PCPs office.    Lactic acid was 3.3 on admission.  She was admitted to the hospital for severe sepsis secondary to acute UTI and sacral cellulitis.  She also had acute hypoxic respiratory failure.      Assessment/Plan:   Principal Problem:   Severe sepsis (HCC) Active Problems:   Acute UTI   Cellulitis of sacral region   Bilateral pulmonary embolism (HCC)   Tachycardia   Aspiration into airway   Multiple sclerosis (HCC)   Chronic constipation   Hypotension    Body mass index is 25.24 kg/m.   Severe sepsis secondary to acute UTI, sacral cellulitis: Continue IV cefepime and vancomycin.  Discontinue Flagyl.  Follow-up urine and blood cultures.    Hypotension: Continue  midodrine.  Resume propranolol because of tachycardia.  Hypokalemia: Improved  Aspiration into airway: Repeat chest x-ray on 04/18/2022 showed resolving multilobar pneumonia.  She is on empiric antibiotics.  Recent bilateral pulmonary embolism and lower extremity DVT in November 2023: Continue Xarelto.  She Complained of dry mouth from Eliquis and wants to try Xarelto.  Unstageable decubitus ulcers on sacrum and bilateral buttocks: Continue local wound care.  Multiple sclerosis with paraplegia, wheelchair-bound: Continue dalfampridine  Sinus tachycardia: Patient said she has chronic sinus tachycardia for which she takes propranolol.  Resume propranolol.    Diet Order             Diet regular Room service appropriate? Yes with Assist; Fluid consistency: Thin  Diet effective now                            Consultants: None  Procedures: None    Medications:    dalfampridine  10 mg Oral Q12H   DULoxetine  30 mg Oral Daily   gabapentin  400 mg Oral BID   leptospermum manuka honey  1 Application Topical Daily   midodrine  5 mg Oral TID WC   polyethylene glycol  17 g Oral Daily   pregabalin  75 mg Oral TID   propranolol  20 mg Oral BID   rivaroxaban  20 mg Oral Q supper   senna-docusate  1 tablet Oral Daily   sodium chloride flush  3 mL Intravenous Q12H   Continuous Infusions:  ceFEPime (MAXIPIME) IV 2 g (04/19/22 1112)   vancomycin Stopped (04/19/22  0150)     Anti-infectives (From admission, onward)    Start     Dose/Rate Route Frequency Ordered Stop   04/18/22 2000  ceFEPIme (MAXIPIME) 2 g in sodium chloride 0.9 % 100 mL IVPB        2 g 200 mL/hr over 30 Minutes Intravenous Every 8 hours 04/18/22 1849     04/17/22 1800  vancomycin (VANCOREADY) IVPB 1250 mg/250 mL        1,250 mg 166.7 mL/hr over 90 Minutes Intravenous Every 24 hours 04/16/22 1948     04/16/22 2200  ceFEPIme (MAXIPIME) 2 g in sodium chloride 0.9 % 100 mL IVPB  Status:  Discontinued         2 g 200 mL/hr over 30 Minutes Intravenous Every 8 hours 04/16/22 1948 04/18/22 1849   04/16/22 2000  metroNIDAZOLE (FLAGYL) IVPB 500 mg  Status:  Discontinued        500 mg 100 mL/hr over 60 Minutes Intravenous Every 12 hours 04/16/22 1909 04/19/22 1510   04/16/22 1730  vancomycin (VANCOREADY) IVPB 1250 mg/250 mL        1,250 mg 166.7 mL/hr over 90 Minutes Intravenous  Once 04/16/22 1647 04/16/22 1951   04/16/22 1700  ceFEPIme (MAXIPIME) 1 g in sodium chloride 0.9 % 100 mL IVPB  Status:  Discontinued        1 g 200 mL/hr over 30 Minutes Intravenous Every 8 hours 04/16/22 1645 04/16/22 1948              Family Communication/Anticipated D/C date and plan/Code Status   DVT prophylaxis:  rivaroxaban (XARELTO) tablet 20 mg     Code Status: Full Code  Family Communication: Plan discussed with his significant other at the bedside Disposition Plan: Plan to discharge home in 1 to 3 days   Status is: Inpatient Remains inpatient appropriate because: Severe sepsis       Subjective:   She feels better today.  No cough, shortness of breath or chest pain.  No urinary symptoms.  Significant other was at the bedside.  Objective:    Vitals:   04/18/22 2340 04/19/22 0341 04/19/22 0801 04/19/22 1203  BP: (!) 105/59 (!) 106/57 103/62 117/62  Pulse: (!) 131 (!) 140 (!) 135 (!) 135  Resp: 18 19 17 17   Temp: 99.8 F (37.7 C) (!) 100.4 F (38 C) 98 F (36.7 C) 97.7 F (36.5 C)  TempSrc: Oral Oral    SpO2: 92% 91% 95% 98%  Weight:      Height:       No data found.   Intake/Output Summary (Last 24 hours) at 04/19/2022 1510 Last data filed at 04/19/2022 1338 Gross per 24 hour  Intake 651.85 ml  Output 1250 ml  Net -598.15 ml   Filed Weights   04/16/22 1311  Weight: 62.6 kg    Exam:  GEN: NAD SKIN: Unstageable sacral and bilateral buttock decubitus ulcers. EYES: Anicteric ENT: MMM CV: RRR, tachycardic PULM: Bibasilar rales, no wheezing ABD: soft, ND, NT,  +BS CNS: AAO x 3, paraplegic EXT: No edema or tenderness       Pressure Injury 04/17/22 Buttocks Mid;Right;Left Unstageable - Full thickness tissue loss in which the base of the injury is covered by slough (yellow, tan, gray, green or brown) and/or eschar (tan, brown or black) in the wound bed. Larger area of thicke (Active)  04/17/22 0014  Location: Buttocks  Location Orientation: Mid;Right;Left  Staging: Unstageable - Full thickness tissue loss in which the base  of the injury is covered by slough (yellow, tan, gray, green or brown) and/or eschar (tan, brown or black) in the wound bed.  Wound Description (Comments): Larger area of thickened skin with pressure ulcer in the middle  Present on Admission: Yes     Data Reviewed:   I have personally reviewed following labs and imaging studies:  Labs: Labs show the following:   Basic Metabolic Panel: Recent Labs  Lab 04/16/22 1307 04/17/22 0410 04/17/22 2035 04/18/22 0440 04/19/22 0627  NA 134* 134*  --  134* 134*  K 4.5 3.2* 3.9 3.9 3.6  CL 97* 106  --  105 102  CO2 22 22  --  22 22  GLUCOSE 92 98  --  99 95  BUN 10 10  --  6 7  CREATININE 0.50 0.52  --  0.41* 0.46  CALCIUM 9.1 7.5*  --  8.1* 8.0*  MG  --   --  1.9  --  1.9  PHOS  --   --   --   --  3.0   GFR Estimated Creatinine Clearance: 65.9 mL/min (by C-G formula based on SCr of 0.46 mg/dL). Liver Function Tests: Recent Labs  Lab 04/16/22 1307 04/17/22 0410 04/18/22 0440  AST 30 13* 12*  ALT 23 16 16   ALKPHOS 184* 127* 142*  BILITOT 0.7 1.0 0.8  PROT 7.2 5.1* 5.4*  ALBUMIN 3.1* 2.1* 2.3*   No results for input(s): "LIPASE", "AMYLASE" in the last 168 hours. No results for input(s): "AMMONIA" in the last 168 hours. Coagulation profile No results for input(s): "INR", "PROTIME" in the last 168 hours.  CBC: Recent Labs  Lab 04/16/22 1307 04/17/22 0410 04/17/22 2035 04/18/22 0440 04/19/22 0627  WBC 29.1* 17.4*  --  17.6* 12.7*  NEUTROABS  --  14.5*   --  14.8* 10.1*  HGB 10.5* 7.0* 8.0* 7.6* 7.5*  HCT 35.4* 22.9* 27.3* 24.6* 24.5*  MCV 91.5 88.4  --  87.2 85.7  PLT 374 265  --  285 285   Cardiac Enzymes: No results for input(s): "CKTOTAL", "CKMB", "CKMBINDEX", "TROPONINI" in the last 168 hours. BNP (last 3 results) No results for input(s): "PROBNP" in the last 8760 hours. CBG: No results for input(s): "GLUCAP" in the last 168 hours. D-Dimer: No results for input(s): "DDIMER" in the last 72 hours. Hgb A1c: No results for input(s): "HGBA1C" in the last 72 hours. Lipid Profile: No results for input(s): "CHOL", "HDL", "LDLCALC", "TRIG", "CHOLHDL", "LDLDIRECT" in the last 72 hours. Thyroid function studies: No results for input(s): "TSH", "T4TOTAL", "T3FREE", "THYROIDAB" in the last 72 hours.  Invalid input(s): "FREET3" Anemia work up: No results for input(s): "VITAMINB12", "FOLATE", "FERRITIN", "TIBC", "IRON", "RETICCTPCT" in the last 72 hours. Sepsis Labs: Recent Labs  Lab 04/16/22 1307 04/16/22 1527 04/17/22 0410 04/18/22 0440 04/19/22 0627  PROCALCITON 0.58  --  1.25 1.01  --   WBC 29.1*  --  17.4* 17.6* 12.7*  LATICACIDVEN 3.3* 2.6*  --  0.7  --     Microbiology Recent Results (from the past 240 hour(s))  Blood culture (routine x 2)     Status: None (Preliminary result)   Collection Time: 04/16/22  1:07 PM   Specimen: BLOOD  Result Value Ref Range Status   Specimen Description BLOOD BLOOD RIGHT ARM  Final   Special Requests   Final    BOTTLES DRAWN AEROBIC AND ANAEROBIC Blood Culture adequate volume   Culture   Final    NO GROWTH 3 DAYS  Performed at [facility], [address] [place], [place] [address]    Report Status PENDING  Incomplete  Resp panel by RT-PCR (RSV, Flu A&B, Covid) Anterior Nasal Swab     Status: None   Collection Time: 04/16/22  1:07 PM   Specimen: Anterior Nasal Swab  Result Value Ref Range Status   SARS Coronavirus 2 by RT PCR NEGATIVE NEGATIVE Final    Comment:  (NOTE) SARS-CoV-2 target nucleic acids are NOT DETECTED.  The SARS-CoV-2 RNA is generally detectable in upper respiratory specimens during the acute phase of infection. The lowest concentration of SARS-CoV-2 viral copies this assay can detect is 138 copies/mL. A negative result does not preclude SARS-Cov-2 infection and should not be used as the sole basis for treatment or other patient management decisions. A negative result may occur with  improper specimen collection/handling, submission of specimen other than nasopharyngeal swab, presence of viral mutation(s) within the areas targeted by this assay, and inadequate number of viral copies(<138 copies/mL). A negative result must be combined with clinical observations, patient history, and epidemiological information. The expected result is Negative.  Fact Sheet for Patients:  BloggerCourse.com  Fact Sheet for Healthcare Providers:  SeriousBroker.it  This test is no t yet approved or cleared by the [place] FDA and  has been authorized for detection and/or diagnosis of SARS-CoV-2 by FDA under an Emergency Use Authorization (EUA). This EUA will remain  in effect (meaning this test can be used) for the duration of the COVID-19 declaration under Section 564(b)(1) of the Act, 21 U.S.C.section 360bbb-3(b)(1), unless the authorization is terminated  or revoked sooner.       Influenza A by PCR NEGATIVE NEGATIVE Final   Influenza B by PCR NEGATIVE NEGATIVE Final    Comment: (NOTE) The Xpert Xpress SARS-CoV-2/FLU/RSV plus assay is intended as an aid in the diagnosis of influenza from Nasopharyngeal swab specimens and should not be used as a sole basis for treatment. Nasal washings and aspirates are unacceptable for Xpert Xpress SARS-CoV-2/FLU/RSV testing.  Fact Sheet for Patients: BloggerCourse.com  Fact Sheet for Healthcare  Providers: SeriousBroker.it  This test is not yet approved or cleared by the [place] FDA and has been authorized for detection and/or diagnosis of SARS-CoV-2 by FDA under an Emergency Use Authorization (EUA). This EUA will remain in effect (meaning this test can be used) for the duration of the COVID-19 declaration under Section 564(b)(1) of the Act, 21 U.S.C. section 360bbb-3(b)(1), unless the authorization is terminated or revoked.     Resp Syncytial Virus by PCR NEGATIVE NEGATIVE Final    Comment: (NOTE) Fact Sheet for Patients: BloggerCourse.com  Fact Sheet for Healthcare Providers: SeriousBroker.it  This test is not yet approved or cleared by the [place] FDA and has been authorized for detection and/or diagnosis of SARS-CoV-2 by FDA under an Emergency Use Authorization (EUA). This EUA will remain in effect (meaning this test can be used) for the duration of the COVID-19 declaration under Section 564(b)(1) of the Act, 21 U.S.C. section 360bbb-3(b)(1), unless the authorization is terminated or revoked.  Performed at [facility], [address] [place], [place] [address]   Blood culture (routine x 2)     Status: None (Preliminary result)   Collection Time: 04/16/22  1:12 PM   Specimen: BLOOD  Result Value Ref Range Status   Specimen Description BLOOD BLOOD LEFT ARM  Final   Special Requests   Final    BOTTLES DRAWN AEROBIC AND ANAEROBIC Blood Culture adequate  volume   Culture   Final    NO GROWTH 3 DAYS Performed at [facility], [address] [place], [place] [address]    Report Status PENDING  Incomplete  Urine Culture     Status: None (Preliminary result)   Collection Time: 04/16/22  3:27 PM   Specimen: Urine, Clean Catch  Result Value Ref Range Status   Specimen Description   Final    URINE, CLEAN CATCH Performed at [facility], [address] [place], [place] [address]    Special Requests   Final    NONE Performed at [facility], [address], [place], [place] [address]    Culture   Final    CULTURE REINCUBATED FOR BETTER GROWTH Performed at [facility], [address], [place], [place] [address]    Report Status PENDING  Incomplete    Procedures and diagnostic studies:  No results found.             LOS: 3 days   Jawuan Robb  [facility]   Pager on [email or web address]. If 7PM-7AM, please contact night-coverage at [email or web address]     04/19/2022, 3:10 PM

## 2022-04-20 DIAGNOSIS — A419 Sepsis, unspecified organism: Secondary | ICD-10-CM | POA: Diagnosis not present

## 2022-04-20 DIAGNOSIS — N390 Urinary tract infection, site not specified: Secondary | ICD-10-CM | POA: Diagnosis not present

## 2022-04-20 DIAGNOSIS — R6520 Severe sepsis without septic shock: Secondary | ICD-10-CM | POA: Diagnosis not present

## 2022-04-20 DIAGNOSIS — L03319 Cellulitis of trunk, unspecified: Secondary | ICD-10-CM | POA: Diagnosis not present

## 2022-04-20 LAB — CBC
HCT: 25.7 % — ABNORMAL LOW (ref 36.0–46.0)
Hemoglobin: 7.9 g/dL — ABNORMAL LOW (ref 12.0–15.0)
MCH: 27 pg (ref 26.0–34.0)
MCHC: 30.7 g/dL (ref 30.0–36.0)
MCV: 87.7 fL (ref 80.0–100.0)
Platelets: 260 10*3/uL (ref 150–400)
RBC: 2.93 MIL/uL — ABNORMAL LOW (ref 3.87–5.11)
RDW: 18.4 % — ABNORMAL HIGH (ref 11.5–15.5)
WBC: 12.1 10*3/uL — ABNORMAL HIGH (ref 4.0–10.5)
nRBC: 0 % (ref 0.0–0.2)

## 2022-04-20 LAB — BASIC METABOLIC PANEL
Anion gap: 9 (ref 5–15)
BUN: 8 mg/dL (ref 6–20)
CO2: 24 mmol/L (ref 22–32)
Calcium: 8.2 mg/dL — ABNORMAL LOW (ref 8.9–10.3)
Chloride: 104 mmol/L (ref 98–111)
Creatinine, Ser: 0.46 mg/dL (ref 0.44–1.00)
GFR, Estimated: >60 mL/min (ref >60)
Glucose, Bld: 99 mg/dL (ref 70–99)
Potassium: 3.4 mmol/L — ABNORMAL LOW (ref 3.5–5.1)
Sodium: 137 mmol/L (ref 135–145)

## 2022-04-20 MED ORDER — ALUM & MAG HYDROXIDE-SIMETH 200-200-20 MG/5ML PO SUSP
30.0000 mL | Freq: Four times a day (QID) | ORAL | Status: DC | PRN
  Administered 2022-04-20: 30 mL via ORAL
  Filled 2022-04-20: qty 30

## 2022-04-20 MED ORDER — IMIPRAMINE HCL 25 MG PO TABS
25.0000 mg | ORAL_TABLET | Freq: Every day | ORAL | Status: DC
  Administered 2022-04-20: 25 mg via ORAL
  Filled 2022-04-20: qty 1

## 2022-04-20 MED ORDER — TRAZODONE HCL 50 MG PO TABS
50.0000 mg | ORAL_TABLET | Freq: Every day | ORAL | Status: DC
  Administered 2022-04-20: 50 mg via ORAL
  Filled 2022-04-20: qty 1

## 2022-04-20 MED ORDER — ROPINIROLE HCL 1 MG PO TABS
0.5000 mg | ORAL_TABLET | Freq: Every day | ORAL | Status: DC
  Administered 2022-04-20: 0.5 mg via ORAL
  Filled 2022-04-20: qty 1

## 2022-04-20 MED ORDER — POTASSIUM CHLORIDE CRYS ER 20 MEQ PO TBCR
40.0000 meq | EXTENDED_RELEASE_TABLET | ORAL | Status: AC
  Administered 2022-04-20 (×2): 40 meq via ORAL
  Filled 2022-04-20 (×2): qty 2

## 2022-04-20 NOTE — Care Management Important Message (Signed)
Important Message  Patient Details  Name: [name] MRN: [id number] Date of Birth: [birth year]   Medicare Important Message Given:  Yes     Dannette Barbara 04/20/2022, 12:00 PM

## 2022-04-20 NOTE — Consult Note (Signed)
Pharmacy Antibiotic Note  [name] is a 62 y.o. female admitted on 04/16/2022 with UTI, multilobar pneumonia on chest xray, and unstageable decubitis ulcers on sacrum and bilateral buttocks. PMH significant for PE s/p thrombectomy, MS, RLS. Recent IV antibiotics November 2023. Pharmacy has been consulted for vancomycin and cefepime dosing.  Plan: Day 5 of antibiotics, Scr stable  Continue vancomycin 1250 mg IV Q24H. Goal AUC 400-550. Expected AUC: 512.6 Expected Css min: 11.3 SCr used: 0.8 (actual 0.50)  Weight used: IBW, Vd used: 0.72 (BMI 25.2; IBW 50 kg) Continue cefepime 2 g IV Q8H Patient is also on metronidazole 500 mg IV Q12H Continue to monitor renal function and follow culture results   Height: 5\' 2"  (157.5 cm) Weight: 62.6 kg (138 lb) IBW/kg (Calculated) : 50.1  Temp (24hrs), Avg:98.5 F (36.9 C), Min:97.7 F (36.5 C), Max:100 F (37.8 C)  Recent Labs  Lab 04/16/22 1307 04/16/22 1527 04/17/22 0410 04/18/22 0440 04/19/22 0627 04/20/22 0507  WBC 29.1*  --  17.4* 17.6* 12.7* 12.1*  CREATININE 0.50  --  0.52 0.41* 0.46 0.46  LATICACIDVEN 3.3* 2.6*  --  0.7  --   --      Estimated Creatinine Clearance: 65.9 mL/min (by C-G formula based on SCr of 0.46 mg/dL).    Allergies  Allergen Reactions   Erythromycin Hives and Nausea And Vomiting   Lexapro [Escitalopram Oxalate] Hives    Antimicrobials this admission: 1/4 Vancomycin >>  1/4 Cefepime >>  1/4 Metronidazole >>  Dose adjustments this admission: N/A  Microbiology results: 1/4 BCx: NGTD 1/4 Ucx: multiple species  Thank you for allowing pharmacy to be a part of this patient's care.   Glean Salvo, PharmD, BCPS Clinical Pharmacist  04/20/2022 11:13 AM

## 2022-04-20 NOTE — Plan of Care (Signed)
  Problem: Clinical Measurements: Goal: Ability to maintain clinical measurements within normal limits will improve Outcome: Not Progressing Note: Notified NP Foust of low blood pressure. Patient is asymptomatic. No new orders at this time.

## 2022-04-20 NOTE — Progress Notes (Signed)
Progress Note    [name]  OIZ:[id number] DOB: 1960-08-11  DOA: 04/16/2022 PCP: [facility]      Brief Narrative:    Medical records reviewed and are as summarized below:  [name] is a 62 y.o. female with medical history significant for hypertension, depression, anxiety, restless leg syndrome, GERD, multiple sclerosis, history of COVID-19 infection in November 2023, bilateral pulmonary embolism and bilateral lower extremity DVT diagnosed in November 2023, s/p pulmonary thrombectomy on 02/25/2022, recent discharge from the hospital on 03/20/2022 to [facility] after hospitalization 02/24/2022 for acute PE, aspiration pneumonia, sepsis, respiratory failure and metabolic encephalopathy.  She said she stayed at [facility] for about 2 weeks.  She presented to the hospital because of fever.  She was actually referred from [facility] clinic to the emergency department.  She complained of generalized weakness, fatigue, chills and a wound on her buttock.  Her significant other had been applying Desitin cream but it did not seem to help. She was hypoxic with oxygen saturation of 80%, tachycardic with heart rate of 122, febrile with temperature of 101 F at the PCPs office.    Lactic acid was 3.3 on admission.  She was admitted to the hospital for severe sepsis secondary to acute UTI and sacral cellulitis.  She also had acute hypoxic respiratory failure.      Assessment/Plan:   Principal Problem:   Severe sepsis (HCC) Active Problems:   Acute UTI   Cellulitis of sacral region   Bilateral pulmonary embolism (HCC)   Tachycardia   Aspiration into airway   Multiple sclerosis (HCC)   Chronic constipation   Hypotension    Body mass index is 25.24 kg/m.   Severe sepsis secondary to acute UTI, sacral cellulitis: Continue IV cefepime and vancomycin.  Urine culture showed multiple species and was not helpful.  No growth on blood  cultures thus far.  Plan to complete 5 days of IV cefepime and vancomycin today.  Hypotension: Continue midodrine.  Hypokalemia: Replete potassium and monitor levels  Aspiration into airway: Repeat chest x-ray on 04/18/2022 showed resolving multilobar pneumonia.  She is on empiric antibiotics.  Recent bilateral pulmonary embolism and lower extremity DVT in November 2023: Continue Xarelto.  She Complained of dry mouth from Eliquis and wants to try Xarelto.  Unstageable decubitus ulcers on sacrum and bilateral buttocks: Continue local wound care.  Multiple sclerosis with paraplegia, wheelchair-bound: Continue dalfampridine  Sinus tachycardia: Patient said she has chronic sinus tachycardia for which she takes propranolol.  Continue propranolol    Diet Order             Diet regular Room service appropriate? Yes with Assist; Fluid consistency: Thin  Diet effective now                            Consultants: None  Procedures: None    Medications:    dalfampridine  10 mg Oral Q12H   DULoxetine  30 mg Oral Daily   gabapentin  400 mg Oral BID   imipramine  25 mg Oral QHS   leptospermum manuka honey  1 Application Topical Daily   midodrine  5 mg Oral TID WC   polyethylene glycol  17 g Oral Daily   pregabalin  75 mg Oral TID   propranolol  20 mg Oral BID   rivaroxaban  20 mg Oral Q supper   rOPINIRole  0.5 mg Oral QHS  senna-docusate  1 tablet Oral Daily   sodium chloride flush  3 mL Intravenous Q12H   traZODone  50 mg Oral QHS   Continuous Infusions:  ceFEPime (MAXIPIME) IV 2 g (04/20/22 1216)   vancomycin 1,250 mg (04/19/22 2329)     Anti-infectives (From admission, onward)    Start     Dose/Rate Route Frequency Ordered Stop   04/18/22 2000  ceFEPIme (MAXIPIME) 2 g in sodium chloride 0.9 % 100 mL IVPB        2 g 200 mL/hr over 30 Minutes Intravenous Every 8 hours 04/18/22 1849     04/17/22 1800  vancomycin (VANCOREADY) IVPB 1250 mg/250 mL         1,250 mg 166.7 mL/hr over 90 Minutes Intravenous Every 24 hours 04/16/22 1948     04/16/22 2200  ceFEPIme (MAXIPIME) 2 g in sodium chloride 0.9 % 100 mL IVPB  Status:  Discontinued        2 g 200 mL/hr over 30 Minutes Intravenous Every 8 hours 04/16/22 1948 04/18/22 1849   04/16/22 2000  metroNIDAZOLE (FLAGYL) IVPB 500 mg  Status:  Discontinued        500 mg 100 mL/hr over 60 Minutes Intravenous Every 12 hours 04/16/22 1909 04/19/22 1510   04/16/22 1730  vancomycin (VANCOREADY) IVPB 1250 mg/250 mL        1,250 mg 166.7 mL/hr over 90 Minutes Intravenous  Once 04/16/22 1647 04/16/22 1951   04/16/22 1700  ceFEPIme (MAXIPIME) 1 g in sodium chloride 0.9 % 100 mL IVPB  Status:  Discontinued        1 g 200 mL/hr over 30 Minutes Intravenous Every 8 hours 04/16/22 1645 04/16/22 1948              Family Communication/Anticipated D/C date and plan/Code Status   DVT prophylaxis:  rivaroxaban (XARELTO) tablet 20 mg     Code Status: Full Code  Family Communication: Plan discussed with his significant other at the bedside Disposition Plan: Plan to discharge home tomorrow   Status is: Inpatient Remains inpatient appropriate because: Severe sepsis       Subjective:   Interval events noted.  She has no complaints.  She said she feels better.  Her significant other was at the bedside.  Objective:    Vitals:   04/19/22 2326 04/20/22 0346 04/20/22 0822 04/20/22 1218  BP: (!) 83/54 (!) 86/47 (!) 96/54 (!) 95/53  Pulse: (!) 102 (!) 102 (!) 114 (!) 121  Resp: 18 17 20 18   Temp: 98.2 F (36.8 C) 98.3 F (36.8 C) 98.8 F (37.1 C) 98.3 F (36.8 C)  TempSrc: Oral Oral Oral Oral  SpO2: 94% 94% 93% 98%  Weight:      Height:       No data found.   Intake/Output Summary (Last 24 hours) at 04/20/2022 1532 Last data filed at 04/20/2022 0244 Gross per 24 hour  Intake 511.5 ml  Output 1600 ml  Net -1088.5 ml   Filed Weights   04/16/22 1311  Weight: 62.6 kg    Exam:   GEN:  NAD SKIN: Unstageable sacral and bilateral buttock decubitus ulcers EYES: No pallor or icterus ENT: MMM CV: RRR, tachycardic PULM: CTA B ABD: soft, ND, NT, +BS CNS: AAO x 3, paraplegic EXT: No edema or tenderness    Pressure Injury 04/17/22 Buttocks Mid;Right;Left Unstageable - Full thickness tissue loss in which the base of the injury is covered by slough (yellow, tan, gray, green or brown) and/or  eschar (tan, brown or black) in the wound bed. Larger area of thicke (Active)  04/17/22 0014  Location: Buttocks  Location Orientation: Mid;Right;Left  Staging: Unstageable - Full thickness tissue loss in which the base of the injury is covered by slough (yellow, tan, gray, green or brown) and/or eschar (tan, brown or black) in the wound bed.  Wound Description (Comments): Larger area of thickened skin with pressure ulcer in the middle  Present on Admission: Yes  Dressing Type ABD 04/20/22 0000     Data Reviewed:   I have personally reviewed following labs and imaging studies:  Labs: Labs show the following:   Basic Metabolic Panel: Recent Labs  Lab 04/16/22 1307 04/17/22 0410 04/17/22 2035 04/18/22 0440 04/19/22 0627 04/20/22 0507  NA 134* 134*  --  134* 134* 137  K 4.5 3.2* 3.9 3.9 3.6 3.4*  CL 97* 106  --  105 102 104  CO2 22 22  --  22 22 24   GLUCOSE 92 98  --  99 95 99  BUN 10 10  --  6 7 8   CREATININE 0.50 0.52  --  0.41* 0.46 0.46  CALCIUM 9.1 7.5*  --  8.1* 8.0* 8.2*  MG  --   --  1.9  --  1.9  --   PHOS  --   --   --   --  3.0  --    GFR Estimated Creatinine Clearance: 65.9 mL/min (by C-G formula based on SCr of 0.46 mg/dL). Liver Function Tests: Recent Labs  Lab 04/16/22 1307 04/17/22 0410 04/18/22 0440  AST 30 13* 12*  ALT 23 16 16   ALKPHOS 184* 127* 142*  BILITOT 0.7 1.0 0.8  PROT 7.2 5.1* 5.4*  ALBUMIN 3.1* 2.1* 2.3*   No results for input(s): "LIPASE", "AMYLASE" in the last 168 hours. No results for input(s): "AMMONIA" in the last 168  hours. Coagulation profile No results for input(s): "INR", "PROTIME" in the last 168 hours.  CBC: Recent Labs  Lab 04/16/22 1307 04/17/22 0410 04/17/22 2035 04/18/22 0440 04/19/22 0627 04/20/22 0507  WBC 29.1* 17.4*  --  17.6* 12.7* 12.1*  NEUTROABS  --  14.5*  --  14.8* 10.1*  --   HGB 10.5* 7.0* 8.0* 7.6* 7.5* 7.9*  HCT 35.4* 22.9* 27.3* 24.6* 24.5* 25.7*  MCV 91.5 88.4  --  87.2 85.7 87.7  PLT 374 265  --  285 285 260   Cardiac Enzymes: No results for input(s): "CKTOTAL", "CKMB", "CKMBINDEX", "TROPONINI" in the last 168 hours. BNP (last 3 results) No results for input(s): "PROBNP" in the last 8760 hours. CBG: No results for input(s): "GLUCAP" in the last 168 hours. D-Dimer: No results for input(s): "DDIMER" in the last 72 hours. Hgb A1c: No results for input(s): "HGBA1C" in the last 72 hours. Lipid Profile: No results for input(s): "CHOL", "HDL", "LDLCALC", "TRIG", "CHOLHDL", "LDLDIRECT" in the last 72 hours. Thyroid function studies: No results for input(s): "TSH", "T4TOTAL", "T3FREE", "THYROIDAB" in the last 72 hours.  Invalid input(s): "FREET3" Anemia work up: No results for input(s): "VITAMINB12", "FOLATE", "FERRITIN", "TIBC", "IRON", "RETICCTPCT" in the last 72 hours. Sepsis Labs: Recent Labs  Lab 04/16/22 1307 04/16/22 1527 04/17/22 0410 04/18/22 0440 04/19/22 0627 04/20/22 0507  PROCALCITON 0.58  --  1.25 1.01  --   --   WBC 29.1*  --  17.4* 17.6* 12.7* 12.1*  LATICACIDVEN 3.3* 2.6*  --  0.7  --   --     Microbiology Recent Results (from the  past 240 hour(s))  Blood culture (routine x 2)     Status: None (Preliminary result)   Collection Time: 04/16/22  1:07 PM   Specimen: BLOOD  Result Value Ref Range Status   Specimen Description BLOOD BLOOD RIGHT ARM  Final   Special Requests   Final    BOTTLES DRAWN AEROBIC AND ANAEROBIC Blood Culture adequate volume   Culture   Final    NO GROWTH 4 DAYS Performed at [facility], [address], [place], [place] [address]    Report Status PENDING  Incomplete  Resp panel by RT-PCR (RSV, Flu A&B, Covid) Anterior Nasal Swab     Status: None   Collection Time: 04/16/22  1:07 PM   Specimen: Anterior Nasal Swab  Result Value Ref Range Status   SARS Coronavirus 2 by RT PCR NEGATIVE NEGATIVE Final    Comment: (NOTE) SARS-CoV-2 target nucleic acids are NOT DETECTED.  The SARS-CoV-2 RNA is generally detectable in upper respiratory specimens during the acute phase of infection. The lowest concentration of SARS-CoV-2 viral copies this assay can detect is 138 copies/mL. A negative result does not preclude SARS-Cov-2 infection and should not be used as the sole basis for treatment or other patient management decisions. A negative result may occur with  improper specimen collection/handling, submission of specimen other than nasopharyngeal swab, presence of viral mutation(s) within the areas targeted by this assay, and inadequate number of viral copies(<138 copies/mL). A negative result must be combined with clinical observations, patient history, and epidemiological information. The expected result is Negative.  Fact Sheet for Patients:  BloggerCourse.com  Fact Sheet for Healthcare Providers:  SeriousBroker.it  This test is no t yet approved or cleared by the [place] FDA and  has been authorized for detection and/or diagnosis of SARS-CoV-2 by FDA under an Emergency Use Authorization (EUA). This EUA will remain  in effect (meaning this test can be used) for the duration of the COVID-19 declaration under Section 564(b)(1) of the Act, 21 U.S.C.section 360bbb-3(b)(1), unless the authorization is terminated  or revoked sooner.       Influenza A by PCR NEGATIVE NEGATIVE Final   Influenza B by PCR NEGATIVE NEGATIVE Final    Comment: (NOTE) The Xpert Xpress SARS-CoV-2/FLU/RSV plus assay is intended as an aid in the diagnosis of  influenza from Nasopharyngeal swab specimens and should not be used as a sole basis for treatment. Nasal washings and aspirates are unacceptable for Xpert Xpress SARS-CoV-2/FLU/RSV testing.  Fact Sheet for Patients: BloggerCourse.com  Fact Sheet for Healthcare Providers: SeriousBroker.it  This test is not yet approved or cleared by the [place] FDA and has been authorized for detection and/or diagnosis of SARS-CoV-2 by FDA under an Emergency Use Authorization (EUA). This EUA will remain in effect (meaning this test can be used) for the duration of the COVID-19 declaration under Section 564(b)(1) of the Act, 21 U.S.C. section 360bbb-3(b)(1), unless the authorization is terminated or revoked.     Resp Syncytial Virus by PCR NEGATIVE NEGATIVE Final    Comment: (NOTE) Fact Sheet for Patients: BloggerCourse.com  Fact Sheet for Healthcare Providers: SeriousBroker.it  This test is not yet approved or cleared by the [place] FDA and has been authorized for detection and/or diagnosis of SARS-CoV-2 by FDA under an Emergency Use Authorization (EUA). This EUA will remain in effect (meaning this test can be used) for the duration of the COVID-19 declaration under Section 564(b)(1) of the Act, 21 U.S.C. section 360bbb-3(b)(1), unless the authorization is  terminated or revoked.  Performed at [facility], [address] [place], [place] [address]   Blood culture (routine x 2)     Status: None (Preliminary result)   Collection Time: 04/16/22  1:12 PM   Specimen: BLOOD  Result Value Ref Range Status   Specimen Description BLOOD BLOOD LEFT ARM  Final   Special Requests   Final    BOTTLES DRAWN AEROBIC AND ANAEROBIC Blood Culture adequate volume   Culture   Final    NO GROWTH 4 DAYS Performed at [facility], [address], [place], [place] [address]     Report Status PENDING  Incomplete  Urine Culture     Status: Abnormal   Collection Time: 04/16/22  3:27 PM   Specimen: Urine, Clean Catch  Result Value Ref Range Status   Specimen Description   Final    URINE, CLEAN CATCH Performed at [facility], [address] [place], [place] [address]    Special Requests   Final    NONE Performed at [facility], [address] [place], [place] [address]    Culture MULTIPLE SPECIES PRESENT, SUGGEST RECOLLECTION (A)  Final   Report Status 04/19/2022 FINAL  Final    Procedures and diagnostic studies:  No results found.             LOS: 4 days   Chrysa Rampy  [facility]   Pager on [email or web address]. If 7PM-7AM, please contact night-coverage at [email or web address]     04/20/2022, 3:32 PM

## 2022-04-20 NOTE — Progress Notes (Signed)
   04/19/22 2057  Assess: MEWS Score  Temp 100 F (37.8 C)  BP (!) 105/51  MAP (mmHg) 68  Pulse Rate (!) 128  Resp 19  SpO2 90 %  O2 Device Room Air  Assess: MEWS Score  MEWS Temp 0  MEWS Systolic 0  MEWS Pulse 2  MEWS RR 0  MEWS LOC 0  MEWS Score 2  MEWS Score Color Yellow  Assess: if the MEWS score is Yellow or Red  Were vital signs taken at a resting state? Yes  Focused Assessment No change from prior assessment  Does the patient meet 2 or more of the SIRS criteria? Yes  Does the patient have a confirmed or suspected source of infection? Yes  Provider and Rapid Response Notified? No  MEWS guidelines implemented *See Row Information* No, previously yellow, continue vital signs every 4 hours  Assess: SIRS CRITERIA  SIRS Temperature  0  SIRS Pulse 1  SIRS Respirations  0  SIRS WBC 0  SIRS Score Sum  1   Patient was given tylenol for a elevated temperature.

## 2022-04-21 DIAGNOSIS — L03319 Cellulitis of trunk, unspecified: Secondary | ICD-10-CM | POA: Diagnosis not present

## 2022-04-21 DIAGNOSIS — N390 Urinary tract infection, site not specified: Secondary | ICD-10-CM | POA: Diagnosis not present

## 2022-04-21 DIAGNOSIS — R000 Tachycardia, unspecified: Secondary | ICD-10-CM | POA: Diagnosis not present

## 2022-04-21 DIAGNOSIS — A419 Sepsis, unspecified organism: Secondary | ICD-10-CM | POA: Diagnosis not present

## 2022-04-21 LAB — CBC WITH DIFFERENTIAL/PLATELET
Abs Immature Granulocytes: 0.96 10*3/uL — ABNORMAL HIGH (ref 0.00–0.07)
Basophils Absolute: 0.1 10*3/uL (ref 0.0–0.1)
Basophils Relative: 0 %
Eosinophils Absolute: 0.1 10*3/uL (ref 0.0–0.5)
Eosinophils Relative: 1 %
HCT: 25.4 % — ABNORMAL LOW (ref 36.0–46.0)
Hemoglobin: 7.6 g/dL — ABNORMAL LOW (ref 12.0–15.0)
Immature Granulocytes: 8 %
Lymphocytes Relative: 4 %
Lymphs Abs: 0.5 10*3/uL — ABNORMAL LOW (ref 0.7–4.0)
MCH: 26.3 pg (ref 26.0–34.0)
MCHC: 29.9 g/dL — ABNORMAL LOW (ref 30.0–36.0)
MCV: 87.9 fL (ref 80.0–100.0)
Monocytes Absolute: 1.4 10*3/uL — ABNORMAL HIGH (ref 0.1–1.0)
Monocytes Relative: 12 %
Neutro Abs: 8.7 10*3/uL — ABNORMAL HIGH (ref 1.7–7.7)
Neutrophils Relative %: 75 %
Platelets: 283 10*3/uL (ref 150–400)
RBC: 2.89 MIL/uL — ABNORMAL LOW (ref 3.87–5.11)
RDW: 18.3 % — ABNORMAL HIGH (ref 11.5–15.5)
Smear Review: NORMAL
WBC: 11.7 10*3/uL — ABNORMAL HIGH (ref 4.0–10.5)
nRBC: 0 % (ref 0.0–0.2)

## 2022-04-21 LAB — POTASSIUM: Potassium: 4.4 mmol/L (ref 3.5–5.1)

## 2022-04-21 LAB — CULTURE, BLOOD (ROUTINE X 2)
Culture: NO GROWTH
Culture: NO GROWTH
Special Requests: ADEQUATE
Special Requests: ADEQUATE

## 2022-04-21 LAB — CREATININE, SERUM
Creatinine, Ser: 0.43 mg/dL — ABNORMAL LOW (ref 0.44–1.00)
GFR, Estimated: >60 mL/min (ref >60)

## 2022-04-21 MED ORDER — MIDODRINE HCL 5 MG PO TABS: 5.0000 mg | ORAL_TABLET | Freq: Two times a day (BID) | ORAL | 0 refills | Status: AC

## 2022-04-21 MED ORDER — RIVAROXABAN 20 MG PO TABS: 20.0000 mg | ORAL_TABLET | Freq: Every day | ORAL | 0 refills | Status: AC

## 2022-04-21 NOTE — TOC Transition Note (Addendum)
Transition of Care [facility]) - CM/SW Discharge Note   Patient Details  Name: [name] MRN: [id number] Date of Birth: 1960/11/05  [facility] CM/SW Contact:  Tiburcio Bash, LCSW Phone Number: 04/21/2022, 1:48 PM   Clinical Narrative:     Patient will DC to home with max HH services through [facility], no dme needs as patient declined hoyer lift and electric wheelchair offered due to costs.   TOC signing off.   [place], [place]   Final next level of care: Home w Home Health Services Barriers to Discharge: No Barriers Identified   Patient Goals and CMS Choice CMS Medicare.gov Compare Post Acute Care list provided to:: Patient Choice offered to / list presented to : Patient  Discharge Placement                         Discharge Plan and Services Additional resources added to the After Visit Summary for     Discharge Planning Services: CM Consult Post Acute Care Choice: Resumption of Svcs/PTA Provider          DME Arranged: Other see comment (hoyer lift) DME Agency: [employer] Date DME Agency Contacted: 04/17/22 Time DME Agency Contacted: [phone] Representative spoke with at DME Agency: [place]: RN, PT, OT, Speech Therapy [place] Agency: [place] ([address]) Date [place] Laiza Veenstra: 04/21/22 Time [place]al[place]: [place] Representative spoke with at [place]: [employer] ([place]) Interventions [place]: No Food Insecurity (04/17/2022)  Housing: Low Risk  (04/17/2022)  Transportation Needs: No Transportation Needs (04/17/2022)  Utilities: Not At Risk (04/17/2022)  Tobacco Use: Low Risk  (04/17/2022)     Readmission Risk Interventions     No data to display

## 2022-04-21 NOTE — Discharge Summary (Signed)
Physician Discharge Summary   Patient: [name] MRN: [id number] DOB: 20-Jul-1960  Admit date:     04/16/2022  Discharge date: 04/21/22  Discharge Physician: Lurene Shadow   PCP: [facility]   Recommendations at discharge:   Follow-up with PCP in 1 week  Discharge Diagnoses: Principal Problem:   Severe sepsis (HCC) Active Problems:   Acute UTI   Cellulitis of sacral region   Bilateral pulmonary embolism (HCC)   Tachycardia   Aspiration into airway   Multiple sclerosis (HCC)   Chronic constipation   Hypotension  Resolved Problems:   * No resolved hospital problems. [facility] Course:  Ms. Mekhia Brogan is a 62 y.o. female with medical history significant for hypertension, depression, anxiety, restless leg syndrome, GERD, multiple sclerosis, history of COVID-19 infection in November 2023, bilateral pulmonary embolism and bilateral lower extremity DVT diagnosed in November 2023, s/p pulmonary thrombectomy on 02/25/2022, recent discharge from the hospital on 03/20/2022 to [facility] after hospitalization 02/24/2022 for acute PE, aspiration pneumonia, sepsis, respiratory failure and metabolic encephalopathy.  She said she stayed at [facility] for about 2 weeks.  She presented to the hospital because of fever.  She was actually referred from [facility] clinic to the emergency department.  She complained of generalized weakness, fatigue, chills and a wound on her buttock.  Her significant other had been applying Desitin cream but it did not seem to help. She was hypoxic with oxygen saturation of 80%, tachycardic with heart rate of 122, febrile with temperature of 101 F at the PCPs office.       Lactic acid was 3.3 on admission.  She was admitted to the hospital for severe sepsis secondary to acute UTI and sacral cellulitis.  She also had acute hypoxic respiratory failure.  She was treated with empiric IV antibiotics, IV fluids and oxygen via  nasal cannula.  Her condition has improved.  She feels better and she wants to be discharged home today.  She  understands that she is at high risk for infections and readmission.  Close follow-up with PCP was strongly recommended.     Assessment and Plan:   Severe sepsis secondary to acute UTI, sacral cellulitis: Improved.  She completed 5 days of IV cefepime and vancomycin.  Urine culture showed multiple species and was not helpful.  No growth on blood cultures.    Hypotension: Continue midodrine at discharge with close follow-up with PCP   Hypokalemia: Improved   Aspiration into airway: Repeat chest x-ray on 04/18/2022 showed resolving multilobar pneumonia.  She is on empiric antibiotics.   Recent bilateral pulmonary embolism and lower extremity DVT in November 2023: Continue Xarelto.  She complained of dry mouth from Eliquis and wants to try Xarelto.  Xarelto prescription was provided at discharge with instructions to obtain refills from PCP prior to running out on Xarelto.   Unstageable decubitus ulcers on sacrum and bilateral buttocks: Continue local wound care.   Multiple sclerosis with paraplegia, wheelchair-bound: Continue dalfampridine   Chronic sinus tachycardia: Continue propranolol at discharge.  Chronic anemia: Outpatient follow-up with PCP        Consultants: None Procedures performed: None  Disposition: Home health Diet recommendation:  Cardiac diet DISCHARGE MEDICATION: Allergies as of 04/21/2022       Reactions   Erythromycin Hives, Nausea And Vomiting   Lexapro [escitalopram Oxalate] Hives        Medication List     STOP taking these medications    apixaban 5 MG  Tabs tablet Commonly known as: ELIQUIS   free water Soln   metoprolol tartrate 50 MG tablet Commonly known as: LOPRESSOR       TAKE these medications    ascorbic acid 500 MG tablet Commonly known as: VITAMIN C Take 500 mg by mouth daily.   Cholecalciferol 50 MCG (2000 UT)  Caps Take 2,000 Units by mouth daily.   cyanocobalamin 1000 MCG tablet Place 1 tablet (1,000 mcg total) into feeding tube daily.   cyclobenzaprine 10 MG tablet Commonly known as: FLEXERIL Take 10 mg by mouth 2 (two) times daily as needed.   dalfampridine 10 MG Tb12 Take 1 tablet by mouth every 12 (twelve) hours.   DULoxetine 30 MG capsule Commonly known as: CYMBALTA Take 30 mg by mouth daily.   feeding supplement (OSMOLITE 1.5 CAL) Liqd Place 1,000 mLs into feeding tube continuous.   nutrition supplement (JUVEN) Pack Place 1 packet into feeding tube 2 (two) times daily between meals.   feeding supplement (PROSource TF20) liquid Place 60 mLs into feeding tube daily.   gabapentin 800 MG tablet Commonly known as: NEURONTIN Take 400-800 mg by mouth 2 (two) times daily.   imipramine 25 MG tablet Commonly known as: TOFRANIL Take 25 mg by mouth at bedtime.   midodrine 5 MG tablet Commonly known as: PROAMATINE Take 1 tablet (5 mg total) by mouth 2 (two) times daily with a meal.   omeprazole 20 MG capsule Commonly known as: PRILOSEC Take 20 mg by mouth daily.   pregabalin 75 MG capsule Commonly known as: LYRICA Take 75 mg by mouth 3 (three) times daily.   propranolol 20 MG tablet Commonly known as: INDERAL Take 20 mg by mouth 2 (two) times daily.   rivaroxaban 20 MG Tabs tablet Commonly known as: XARELTO Take 1 tablet (20 mg total) by mouth daily with supper.   rOPINIRole 0.5 MG tablet Commonly known as: REQUIP Take 0.5 mg by mouth at bedtime.   traZODone 50 MG tablet Commonly known as: DESYREL Take 50 mg by mouth at bedtime.               Durable Medical Equipment  (From admission, onward)           Start     Ordered   04/17/22 1259  For home use only DME Other see comment  Once       Comments: Publishing copyHoyer Lift  Fully Electric  Question:  Length of Need  Answer:  Lifetime   04/17/22 1258            Discharge Exam: Filed Weights   04/16/22  1311  Weight: 62.6 kg   GEN: NAD SKIN: Warm and dry EYES: No pallor or icterus ENT: MMM CV: RRR PULM: CTA B ABD: soft, ND, NT, +BS CNS: AAO x 3, paraplegic EXT: No edema or tenderness   Condition at discharge: good  The results of significant diagnostics from this hospitalization (including imaging, microbiology, ancillary and laboratory) are listed below for reference.   Imaging Studies: DG Chest Port 1 View  Result Date: 04/17/2022 CLINICAL DATA:  Aspiration. EXAM: PORTABLE CHEST 1 VIEW COMPARISON:  04/16/2022 and CT chest 04/16/2022, 03/19/2022. FINDINGS: Trachea is midline. Heart size normal. Streaky densities in the upper lobes and lung bases, previously seen as airspace consolidation on 03/19/2022. No dense airspace consolidation or pleural fluid. IMPRESSION: Resolving multilobar pneumonia with residual streaky densities bilaterally. Electronically Signed   By: Leanna BattlesMelinda  Blietz M.D.   On: 04/17/2022 14:10   DG  Chest 2 View  Result Date: 04/16/2022 CLINICAL DATA:  Fever EXAM: CHEST - 2 VIEW COMPARISON:  Radiograph 03/19/2022 FINDINGS: There is overall improved airspace disease in the lungs with probable residual scarring and bandlike atelectasis bilaterally. There is no definite new airspace disease. There is no pleural effusion or evidence of pneumothorax. Cardiomediastinal silhouette is unchanged. Bones are unchanged. IMPRESSION: Improved airspace disease in comparison to recent exams, with residual scarring and bandlike atelectasis bilaterally. No definite new airspace disease. Electronically Signed   By: Caprice Renshaw M.D.   On: 04/16/2022 16:21   CT CHEST ABDOMEN PELVIS W CONTRAST  Result Date: 04/16/2022 CLINICAL DATA:  Sepsis.  Fever. EXAM: CT CHEST, ABDOMEN, AND PELVIS WITH CONTRAST TECHNIQUE: Multidetector CT imaging of the chest, abdomen and pelvis was performed following the standard protocol during bolus administration of intravenous contrast. RADIATION DOSE REDUCTION: This  exam was performed according to the departmental dose-optimization program which includes automated exposure control, adjustment of the mA and/or kV according to patient size and/or use of iterative reconstruction technique. CONTRAST:  OMNIPAQUE IOHEXOL 300 MG/ML  SOLN COMPARISON:  Chest radiographs same date. CT of the chest, abdomen and pelvis 03/19/2022 and 03/03/2022. FINDINGS: CT CHEST FINDINGS Cardiovascular: No acute vascular findings. No significant atherosclerosis demonstrated. The heart size is normal. There is no pericardial effusion. Mediastinum/Nodes: There are no enlarged mediastinal, hilar or axillary lymph nodes. Posterior left thyroid nodule again noted measuring up to 1.8 cm in diameter. If not previously performed, recommend thyroid ultrasound. (Ref: J Am Coll Radiol. 2015 Feb;12(2): 143-50). Enteric tube has been removed in the interval. There is a moderate size hiatal hernia. Lungs/Pleura: No pleural effusion or pneumothorax. Previously demonstrated diffuse bilateral airspace opacities have significantly improved in the interval. There are scattered small areas of residual inflammation or postinflammatory scarring dependently in both upper and lower lobes. No new pulmonary findings. Musculoskeletal/Chest wall: No chest wall mass or suspicious osseous findings. CT ABDOMEN AND PELVIS FINDINGS Hepatobiliary: The liver is normal in density without suspicious focal abnormality. No evidence of gallstones, gallbladder wall thickening or biliary dilatation. Pancreas: Unremarkable. No pancreatic ductal dilatation or surrounding inflammatory changes. Spleen: Normal in size without focal abnormality. Adrenals/Urinary Tract: Both adrenal glands appear normal. No evidence of urinary tract calculus, suspicious renal lesion or hydronephrosis. A small amount of air is noted non dependently within the bladder lumen. Previously demonstrated hyperdense focus dependently on the right is no longer seen. There  is no bladder wall thickening. Stomach/Bowel: No enteric contrast administered. As above, moderate-size hiatal hernia. The stomach otherwise appears unremarkable for its degree of distention. No evidence of bowel wall thickening, distention or surrounding inflammation. The appendix appears normal. There is moderate stool throughout the colon. There is new presacral soft tissue stranding which appears non circumferential and without associated abnormality of the rectum. Vascular/Lymphatic: There are no enlarged abdominal or pelvic lymph nodes. No significant vascular findings. Reproductive: The uterus and ovaries appear stable. Probable small posterior uterine fundal fibroid. No adnexal mass. Other: As above, new presacral soft tissue stranding without focal fluid collection, foreign body or soft tissue emphysema. No ascites or free air. Musculoskeletal: Apparent decubitus ulceration over the distal sacrum and coccyx with adjacent subcutaneous soft tissue stranding which may account for the pre sacral edema. No evidence of osteomyelitis. No evidence of acute fracture or dislocation. Mild facet hypertrophy noted. There is asymmetric fluid lateral to the right greater trochanter which appears similar to previous study, likely reflecting trochanteric bursitis. The soft tissues lateral to  the hips are incompletely visualized on this examination. IMPRESSION: 1. Interval improvement in previously demonstrated diffuse bilateral airspace opacities consistent with resolving pneumonia. There are scattered small areas of residual inflammation or postinflammatory scarring dependently in both upper and lower lobes. No new pulmonary findings. 2. Apparent decubitus ulceration over the distal sacrum and coccyx with adjacent subcutaneous soft tissue stranding. No evidence of osteomyelitis or abscess. 3. New presacral soft tissue stranding without associated abnormality of the rectum. This is nonspecific and could be inflammatory or  posttraumatic. 4. Small amount of air non dependently within the bladder lumen, likely iatrogenic. Previously demonstrated hyperdense focus dependently on the right is no longer seen. 5. Moderate-size hiatal hernia. 6. Stable asymmetric fluid lateral to the right greater trochanter, likely reflecting trochanteric bursitis. 7. Stable left thyroid nodule. If not previously performed, recommend thyroid ultrasound. Electronically Signed   By: Carey Bullocks M.D.   On: 04/16/2022 16:19    Microbiology: Results for orders placed or performed during the hospital encounter of 04/16/22  Blood culture (routine x 2)     Status: None   Collection Time: 04/16/22  1:07 PM   Specimen: BLOOD  Result Value Ref Range Status   Specimen Description BLOOD BLOOD RIGHT ARM  Final   Special Requests   Final    BOTTLES DRAWN AEROBIC AND ANAEROBIC Blood Culture adequate volume   Culture   Final    NO GROWTH 5 DAYS Performed at [facility], [address] [place], [place] [address]    Report Status 04/21/2022 FINAL  Final  Resp panel by RT-PCR (RSV, Flu A&B, Covid) Anterior Nasal Swab     Status: None   Collection Time: 04/16/22  1:07 PM   Specimen: Anterior Nasal Swab  Result Value Ref Range Status   SARS Coronavirus 2 by RT PCR NEGATIVE NEGATIVE Final    Comment: (NOTE) SARS-CoV-2 target nucleic acids are NOT DETECTED.  The SARS-CoV-2 RNA is generally detectable in upper respiratory specimens during the acute phase of infection. The lowest concentration of SARS-CoV-2 viral copies this assay can detect is 138 copies/mL. A negative result does not preclude SARS-Cov-2 infection and should not be used as the sole basis for treatment or other patient management decisions. A negative result may occur with  improper specimen collection/handling, submission of specimen other than nasopharyngeal swab, presence of viral mutation(s) within the areas targeted by this assay, and inadequate number of  viral copies(<138 copies/mL). A negative result must be combined with clinical observations, patient history, and epidemiological information. The expected result is Negative.  Fact Sheet for Patients:  BloggerCourse.com  Fact Sheet for Healthcare Providers:  SeriousBroker.it  This test is no t yet approved or cleared by the [place] FDA and  has been authorized for detection and/or diagnosis of SARS-CoV-2 by FDA under an Emergency Use Authorization (EUA). This EUA will remain  in effect (meaning this test can be used) for the duration of the COVID-19 declaration under Section 564(b)(1) of the Act, 21 U.S.C.section 360bbb-3(b)(1), unless the authorization is terminated  or revoked sooner.       Influenza A by PCR NEGATIVE NEGATIVE Final   Influenza B by PCR NEGATIVE NEGATIVE Final    Comment: (NOTE) The Xpert Xpress SARS-CoV-2/FLU/RSV plus assay is intended as an aid in the diagnosis of influenza from Nasopharyngeal swab specimens and should not be used as a sole basis for treatment. Nasal washings and aspirates are unacceptable for Xpert Xpress SARS-CoV-2/FLU/RSV testing.  Fact Sheet for Patients: BloggerCourse.com  Fact Sheet for Healthcare Providers: IncredibleEmployment.be  This test is not yet approved or cleared by the [place] FDA and has been authorized for detection and/or diagnosis of SARS-CoV-2 by FDA under an Emergency Use Authorization (EUA). This EUA will remain in effect (meaning this test can be used) for the duration of the COVID-19 declaration under Section 564(b)(1) of the Act, 21 U.S.C. section 360bbb-3(b)(1), unless the authorization is terminated or revoked.     Resp Syncytial Virus by PCR NEGATIVE NEGATIVE Final    Comment: (NOTE) Fact Sheet for Patients: EntrepreneurPulse.com.au  Fact Sheet for Healthcare  Providers: IncredibleEmployment.be  This test is not yet approved or cleared by the [place] FDA and has been authorized for detection and/or diagnosis of SARS-CoV-2 by FDA under an Emergency Use Authorization (EUA). This EUA will remain in effect (meaning this test can be used) for the duration of the COVID-19 declaration under Section 564(b)(1) of the Act, 21 U.S.C. section 360bbb-3(b)(1), unless the authorization is terminated or revoked.  Performed at [facility], [address] [place], [place] [address]   Blood culture (routine x 2)     Status: None   Collection Time: 04/16/22  1:12 PM   Specimen: BLOOD  Result Value Ref Range Status   Specimen Description BLOOD BLOOD LEFT ARM  Final   Special Requests   Final    BOTTLES DRAWN AEROBIC AND ANAEROBIC Blood Culture adequate volume   Culture   Final    NO GROWTH 5 DAYS Performed at [facility], [address] [place], [place] [address]    Report Status 04/21/2022 FINAL  Final  Urine Culture     Status: Abnormal   Collection Time: 04/16/22  3:27 PM   Specimen: Urine, Clean Catch  Result Value Ref Range Status   Specimen Description   Final    URINE, CLEAN CATCH Performed at [facility], [address] [place], [place] [address]    Special Requests   Final    NONE Performed at [facility], [address] [place], [place] [address]    Culture MULTIPLE SPECIES PRESENT, SUGGEST RECOLLECTION (A)  Final   Report Status 04/19/2022 FINAL  Final    Labs: CBC: Recent Labs  Lab 04/17/22 0410 04/17/22 2035 04/18/22 0440 04/19/22 0627 04/20/22 0507 04/21/22 0618  WBC 17.4*  --  17.6* 12.7* 12.1* 11.7*  NEUTROABS 14.5*  --  14.8* 10.1*  --  8.7*  HGB 7.0* 8.0* 7.6* 7.5* 7.9* 7.6*  HCT 22.9* 27.3* 24.6* 24.5* 25.7* 25.4*  MCV 88.4  --  87.2 85.7 87.7 87.9  PLT 265  --  285 285 260 413   Basic Metabolic Panel: Recent Labs  Lab 04/16/22 1307  04/17/22 0410 04/17/22 2035 04/18/22 0440 04/19/22 0627 04/20/22 0507 04/21/22 0618  NA 134* 134*  --  134* 134* 137  --   K 4.5 3.2* 3.9 3.9 3.6 3.4* 4.4  CL 97* 106  --  105 102 104  --   CO2 22 22  --  22 22 24   --   GLUCOSE 92 98  --  99 95 99  --   BUN 10 10  --  6 7 8   --   CREATININE 0.50 0.52  --  0.41* 0.46 0.46 0.43*  CALCIUM 9.1 7.5*  --  8.1* 8.0* 8.2*  --   MG  --   --  1.9  --  1.9  --   --   PHOS  --   --   --   --  3.0  --   --    Liver Function Tests: Recent Labs  Lab 04/16/22 1307 04/17/22 0410 04/18/22 0440  AST 30 13* 12*  ALT 23 16 16   ALKPHOS 184* 127* 142*  BILITOT 0.7 1.0 0.8  PROT 7.2 5.1* 5.4*  ALBUMIN 3.1* 2.1* 2.3*   CBG: No results for input(s): "GLUCAP" in the last 168 hours.  Discharge time spent: greater than 30 minutes.  Signed: , MD [facility] 04/21/2022

## 2022-04-21 NOTE — Progress Notes (Signed)
   04/20/22 2015  Assess: MEWS Score  BP (!) 90/56  MAP (mmHg) 65  Pulse Rate (!) 102  Assess: MEWS Score  MEWS Temp 0  MEWS Systolic 1  MEWS Pulse 1  MEWS RR 1  MEWS LOC 0  MEWS Score 3  MEWS Score Color Yellow  Assess: if the MEWS score is Yellow or Red  Were vital signs taken at a resting state? Yes  Focused Assessment No change from prior assessment  Does the patient meet 2 or more of the SIRS criteria? No  Does the patient have a confirmed or suspected source of infection? No  Provider and Rapid Response Notified? No  MEWS guidelines implemented *See Row Information* No, previously yellow, continue vital signs every 4 hours  Assess: SIRS CRITERIA  SIRS Temperature  0  SIRS Pulse 1  SIRS Respirations  1  SIRS WBC 0  SIRS Score Sum  2

## 2022-05-04 ENCOUNTER — Inpatient Hospital Stay: Payer: Medicare Other

## 2022-05-04 ENCOUNTER — Inpatient Hospital Stay: Payer: Medicare Other | Admitting: Oncology

## 2022-05-04 NOTE — Assessment & Plan Note (Deleted)
bilateral pulmonary embolism, possibly provoked by COVID-19 infection.+ proximal DVT bilaterally  Status post thrombectomy 11/15, repeat CT scan decreased clot burden. Microbleeds on MRI brain.  Cleared by neurology to continue anticoagulation.   Continue anticoagulation, currently on Eliquis 5 mg twice daily

## 2022-05-04 NOTE — Assessment & Plan Note (Deleted)
Peripheral blood smear showed small amount of blast which is nonspecific. Peripheral flow cytometry showed monocytosis, reactive versus underlying neoplasm or both. No increase of blasts.

## 2022-05-06 ENCOUNTER — Other Ambulatory Visit
Admission: RE | Admit: 2022-05-06 | Discharge: 2022-05-06 | Disposition: A | Discharge status: Home / Self Care | Payer: Medicare Other | Source: Ambulatory Visit | Attending: Internal Medicine | Admitting: Internal Medicine

## 2022-05-06 ENCOUNTER — Other Ambulatory Visit (HOSPITAL_BASED_OUTPATIENT_CLINIC_OR_DEPARTMENT_OTHER): Payer: Self-pay | Admitting: Internal Medicine

## 2022-05-06 ENCOUNTER — Encounter: Payer: Medicare Other | Attending: Internal Medicine | Admitting: Internal Medicine

## 2022-05-06 DIAGNOSIS — L89154 Pressure ulcer of sacral region, stage 4: Secondary | ICD-10-CM | POA: Diagnosis not present

## 2022-05-06 DIAGNOSIS — L89159 Pressure ulcer of sacral region, unspecified stage: Secondary | ICD-10-CM | POA: Insufficient documentation

## 2022-05-06 DIAGNOSIS — L89610 Pressure ulcer of right heel, unstageable: Secondary | ICD-10-CM | POA: Diagnosis not present

## 2022-05-06 DIAGNOSIS — L89153 Pressure ulcer of sacral region, stage 3: Secondary | ICD-10-CM | POA: Insufficient documentation

## 2022-05-06 DIAGNOSIS — Z7901 Long term (current) use of anticoagulants: Secondary | ICD-10-CM | POA: Diagnosis not present

## 2022-05-06 DIAGNOSIS — G35 Multiple sclerosis: Secondary | ICD-10-CM | POA: Diagnosis not present

## 2022-05-06 DIAGNOSIS — Z86711 Personal history of pulmonary embolism: Secondary | ICD-10-CM | POA: Insufficient documentation

## 2022-05-06 DIAGNOSIS — L89620 Pressure ulcer of left heel, unstageable: Secondary | ICD-10-CM | POA: Insufficient documentation

## 2022-05-07 NOTE — Progress Notes (Signed)
[name] ([id number]) [id number] Page 1 of 5 Visit Report for 05/06/2022 Abuse Risk Screen Details Patient Name: Date of Service: [name] [name], [name] 05/06/2022 8:45 A M Medical Record Number: [id number] Patient Account Number: [id number] Date of Birth/Sex: Treating [name]: 1960/07/23 (62 y.o. [name] Primary Care Edelin Fryer: [name] Other Clinician: Referring Baelynn Schmuhl: Treating Marquel Spoto/Extender: [name], Referral Weeks in Treatment: 0 Abuse Risk Screen Items Answer ABUSE RISK SCREEN: Has anyone close to you tried to hurt or harm you recentlyo No Do you feel uncomfortable with anyone in your familyo No Has anyone forced you do things that you didnt want to doo No Electronic Signature(s) Signed: 05/06/2022 5:47:34 PM By: [name], BSN, [name], [name], [name], [name] Entered By: [name], BSN, [name], [name], [name] on 05/06/2022 09:19:26 -------------------------------------------------------------------------------- Activities of Daily Living Details Patient Name: Date of Service: [name] STRA [name], [name] 05/06/2022 8:45 A M Medical Record Number: [id number] Patient Account Number: [id number] Date of Birth/Sex: Treating [name]: 03-Aug-1960 (62 y.o. [name] Primary Care Nyna Chilton: [name] Other Clinician: Referring Wynette Jersey: Treating Jabin Tapp/Extender: [name], Referral Weeks in Treatment: 0 Activities of Daily Living Items Answer Activities of Daily Living (Please select one for each item) Drive Automobile Not Able T Medications ake Need Assistance Use T elephone Completely Able Care for Appearance Need Assistance Use T oilet Need Assistance Bath / Shower Need Assistance Dress [name] Need Assistance Feed [name] Need Assistance Walk Not Able Get In / Out Bed Need Assistance Housework Need Assistance [place], [place] ([id number]) [id number] [id number].pdf Page 2 of  5 Prepare Meals Need Assistance Handle Money Need Assistance Shop for [name] Need Radio producer) Signed: 05/06/2022 5:47:34 PM By: [name], BSN, [name], [name], [name], [name] Entered By: [name], BSN, [name], [name], [name] on 05/06/2022 09:19:56 -------------------------------------------------------------------------------- Education Screening Details Patient Name: Date of Service: [name] [name], [name] 05/06/2022 8:45 A M Medical Record Number: [id number] Patient Account Number: [id number] Date of Birth/Sex: Treating [name]: May 11, 1960 (62 y.o. [name] Primary Care Harlow Basley: [name] Other Clinician: Referring Leeah Politano: Treating Aphrodite Harpenau/Extender: [name], Referral Weeks in Treatment: 0 Learning Preferences/Education Level/Primary Language Preferred Language: English Cognitive Barrier Language Barrier: No Translator Needed: No Memory Deficit: No Emotional Barrier: No Physical Barrier Impaired Vision: No Impaired Hearing: No Decreased Hand dexterity: No Knowledge/Comprehension Knowledge Level: High Comprehension Level: High Ability to understand written instructions: High Ability to understand verbal instructions: High Motivation Anxiety Level: Calm Cooperation: Cooperative Education Importance: Acknowledges Need Interest in Health Problems: Asks Questions Perception: Coherent Willingness to Engage in [name]-Management High Activities: Readiness to Engage in [name]-Management High Activities: Electronic Signature(s) Signed: 05/06/2022 5:47:34 PM By: [name], BSN, [name], [name], [name], [name] Entered By: [name], BSN, [name], [name], [name] on 05/06/2022 09:20:15 [name] ([id number]) [phone] [id number].pdf Page 3 of 5 -------------------------------------------------------------------------------- Fall Risk Assessment Details Patient Name: Date of Service: [name] [name], [name] 05/06/2022 8:45 A M Medical Record Number:  [id number] Patient Account Number: [id number] Date of Birth/Sex: Treating [name]: May 22, 1960 (62 y.o. Charolette Forward, [name] Primary Care Jonavon Trieu: [name] Other Clinician: Referring Treyvion Durkee: Treating [name]/Extender: [name], Referral Weeks in Treatment: 0 Fall Risk Assessment Items Have you had 2 or more falls in the last 12 monthso 0 Yes Have you had any fall that resulted in injury in the last 12 monthso 0 No FALLS RISK SCREEN History of falling - immediate or within 3 months 25 Yes Secondary diagnosis (Do you have  2 or more medical diagnoseso) 15 Yes Ambulatory aid None/bed rest/wheelchair/nurse 0 Yes Crutches/cane/walker 0 No Furniture 0 No Intravenous therapy Access/Saline/Heparin Lock 0 No Gait/Transferring Normal/ bed rest/ wheelchair 0 Yes Weak (short steps with or without shuffle, stooped but able to lift head while walking, may seek 0 No support from furniture) Impaired (short steps with shuffle, may have difficulty arising from chair, head down, impaired 0 No balance) Mental Status Oriented to own ability 0 Yes Electronic Signature(s) Signed: 05/06/2022 5:47:34 PM By: [name], BSN, [name], [name], [name], [name] Entered By: [name], BSN, [name], [name], [name] on 05/06/2022 09:20:44 -------------------------------------------------------------------------------- Foot Assessment Details Patient Name: Date of Service: [name] STRA [name], [name] 05/06/2022 8:45 A M Medical Record Number: [id number] Patient Account Number: [id number] Date of Birth/Sex: Treating [name]: Jul 28, 1960 (62 y.o. [name] Primary Care [name] Kretchmer: [name] Other Clinician: Referring [name]: Treating [name]/Extender: [name], Referral Weeks in Treatment: 0 Foot Assessment Items Site Locations [place], [place] ([id number]) [id number] [id number].pdf Page 4 of 5 + = Sensation present, - = Sensation absent, C = Callus, U = Ulcer R = Redness, W = Warmth, M  = Maceration, PU = Pre-ulcerative lesion F = Fissure, S = Swelling, D = Dryness Assessment Right: Left: Other Deformity: No No Prior Foot Ulcer: No No Prior Amputation: No No Charcot Joint: No No Ambulatory Status: Non-ambulatory Assistance Device: Wheelchair GaitYouth worker) Signed: 05/06/2022 5:47:34 PM By: [name], BSN, [name], [name], [name], [name] Entered By: [name], BSN, [name], [name], [name] on 05/06/2022 09:21:33 -------------------------------------------------------------------------------- Nutrition Risk Screening Details Patient Name: Date of Service: [name] STRA [name], [name] 05/06/2022 8:45 A M Medical Record Number: [id number] Patient Account Number: [id number] Date of Birth/Sex: Treating [name]: 21-Mar-1961 (62 y.o. [name] Primary Care Meral Geissinger: [name] Other Clinician: Referring Mariaeduarda Defranco: Treating [name]/Extender: [name], Referral Weeks in Treatment: 0 Height (in): Weight (lbs): Body Mass Index (BMI): Nutrition Risk Screening Items Score Screening NUTRITION RISK SCREEN: I have an illness or condition that made me change the kind and/or amount of food I eat 0 No I eat fewer than two meals per day 0 No I eat few fruits and vegetables, or milk products 0 No I have three or more drinks of beer, liquor or wine almost every day 0 No I have tooth or mouth problems that make it hard for me to eat 0 No BREELYNN, BANKERT ([id number]) [phone] [id number].pdf Page 5 of 5 I don't always have enough money to buy the food I need 0 No I eat alone most of the time 0 No I take three or more different prescribed or over-the-counter drugs a day 1 Yes Without wanting to, I have lost or gained 10 pounds in the last six months 0 No I am not always physically able to shop, cook and/or feed myself 0 No Nutrition Protocols Good Risk Protocol 0 No interventions needed Moderate Risk Protocol High Risk Proctocol Risk Level: Good  Risk Score: 1 Electronic Signature(s) Signed: 05/06/2022 5:47:34 PM By: [name], BSN, [name], [name], [name], [name] Entered By: [name], BSN, [name], [name], [name] on 05/06/2022 09:21:09

## 2022-05-07 NOTE — Progress Notes (Addendum)
ANAIRA, SEAY ([id number]) [id number] Page 1 of 11 Visit Report for 05/06/2022 Chief Complaint Document Details Patient Name: Date of Service: [name] STRA ND, [name] 05/06/2022 8:45 A M Medical Record Number: [id number] Patient Account Number: [id number] Date of Birth/Sex: Treating RN: 03-26-1961 (62 y.o. [name] Primary Care Provider: Gladstone Lighter Other Clinician: Referring Provider: Treating Provider/Extender: Kalman Shan Self, Referral Weeks in Treatment: 0 Information Obtained from: Patient Chief Complaint 05/06/2022; Bilateral feet wounds and sacral wounds Electronic Signature(s) Signed: 05/06/2022 3:12:59 PM By: Kalman Shan DO Entered By: Kalman Shan on 05/06/2022 10:53:51 -------------------------------------------------------------------------------- Debridement Details Patient Name: Date of Service: [name] ND, [name] 05/06/2022 8:45 A M Medical Record Number: [id number] Patient Account Number: [id number] Date of Birth/Sex: Treating RN: Jun 04, 1960 (62 y.o. [name] Primary Care Provider: Gladstone Lighter Other Clinician: Referring Provider: Treating Provider/Extender: Kalman Shan Self, Referral Weeks in Treatment: 0 Debridement Performed for Assessment: Wound #2 Left Calcaneus Performed By: Physician Kalman Shan, MD Debridement Type: Debridement Level of Consciousness (Pre-procedure): Awake and Alert Pre-procedure Verification/Time Out Yes - 10:15 Taken: Pain Control: Lidocaine T Area Debrided (L x W): otal 2.8 (cm) x 4.2 (cm) = 11.76 (cm) Tissue and other material debrided: Non-Viable, Eschar Level: Non-Viable Tissue Debridement Description: Selective/Open Wound Instrument: Curette, Rongeur Bleeding: None Hemostasis Achieved: Pressure Response to Treatment: Procedure was tolerated well Level of Consciousness (Post- Awake and Alert procedure): Post Debridement Measurements  of Total Wound [name] ([id number]) [id number] Page 2 of 11 Length: (cm) 2.8 Stage: Unstageable/Unclassified Width: (cm) 4.2 Depth: (cm) 0.1 Volume: (cm) 0.924 Character of Wound/Ulcer Post Debridement: Stable Post Procedure Diagnosis Same as Pre-procedure Notes scraped to allow santyl to penetrate wound. Electronic Signature(s) Signed: 05/06/2022 3:12:59 PM By: Kalman Shan DO Signed: 05/06/2022 5:47:34 PM By: Gretta Cool, BSN, RN, CWS, Kim RN, BSN Entered By: Gretta Cool, BSN, RN, CWS, Kim on 05/06/2022 10:33:50 -------------------------------------------------------------------------------- Debridement Details Patient Name: Date of Service: [name] ND, [name] 05/06/2022 8:45 A M Medical Record Number: [id number] Patient Account Number: [id number] Date of Birth/Sex: Treating RN: [birth year] (62 y.o. Charolette Forward, Kim Primary Care Provider: Gladstone Lighter Other Clinician: Referring Provider: Treating Provider/Extender: Kalman Shan Self, Referral Weeks in Treatment: 0 Debridement Performed for Assessment: Wound #3 Proximal,Midline Sacrum Performed By: Physician Kalman Shan, MD Debridement Type: Debridement Level of Consciousness (Pre-procedure): Awake and Alert Pre-procedure Verification/Time Out Yes - 10:15 Taken: T Area Debrided (L x W): otal 0.5 (cm) x 0.5 (cm) = 0.25 (cm) Tissue and other material debrided: Non-Viable, Bone Level: Skin/Subcutaneous Tissue/Muscle/Bone Debridement Description: Excisional Instrument: Rongeur Specimen: Swab, Tissue Culture Number of Specimens T aken: 2 Bleeding: None Hemostasis Achieved: Pressure Response to Treatment: Procedure was tolerated well Level of Consciousness (Post- Awake and Alert procedure): Post Debridement Measurements of Total Wound Length: (cm) 7.5 Stage: Category/Stage IV Width: (cm) 4 Depth: (cm) 6 Volume: (cm) 141.372 Character of Wound/Ulcer Post Debridement:  Stable Post Procedure Diagnosis Same as Pre-procedure Notes Bone and tissue were sent for biopsy and culture. Electronic Signature(s) Signed: 05/06/2022 1:31:41 PM By: Gretta Cool, BSN, RN, CWS, Kim RN, BSN Signed: 05/06/2022 3:12:59 PM By: Antonieta Pert, 05/06/2022 3:12:59 PM By: Kalman Shan DO SignedAggie Moats ([id number]) [id number] Page 3 of 11 Entered By: Gretta Cool, BSN, RN, CWS, Kim on 05/06/2022 13:31:41 -------------------------------------------------------------------------------- HPI Details Patient Name: Date of Service: [name] ND, [name] 05/06/2022 8:45 A M Medical Record Number: [id number] Patient Account Number: [id number] Date of Birth/Sex: Treating RN: 1960/10/02 (62 y.o. F)  [name] Primary Care Provider: Enid Baas Other Clinician: Referring Provider: Treating Provider/Extender: Geralyn Corwin Self, Referral Weeks in Treatment: 0 History of Present Illness HPI Description: 05/06/2022 [name] is a 62 year old female with a past medical history of multiple sclerosis and PEs and DVT that presents to the clinic for sacral s wounds and Bilateral feet wounds. Patient states that she obtained the sacral ulcers when she was hospitalized in November 2023 for bilateral pulmonary embolisms. She was in the ICU on a ventilator. Since discharge she has been using Dakin's wet-to-dry dressings to the area. Since her hospitalization she has been bedbound. However she is doing physical therapy. Prior to being hospitalized she was able to pull herself up and transfer from bed to chair on her own. Due to her MS she is not fully mobile. Unfortunately she states that the wound has gotten larger on her sacrum despite Wound care. Patient has home health. On 04/16/2022 she was admitted to the hospital for severe sepsis secondary to acute UTI and sacral cellulitis. She completed 5 days of IV cefepime and vancomycin. She  was not discharged with oral antibiotics. She states she developed the bilateral feet wounds at that time. She states she has bunny boots however she is not wearing them. She currently denies systemic signs of infection. Electronic Signature(s) Signed: 05/06/2022 3:12:59 PM By: Geralyn Corwin DO Entered By: Geralyn Corwin on 05/06/2022 11:35:04 -------------------------------------------------------------------------------- Physical Exam Details Patient Name: Date of Service: [name] ND, [name] 05/06/2022 8:45 A M Medical Record Number: [id number] Patient Account Number: [id number] Date of Birth/Sex: Treating RN: [birth year] (62 y.o. [name] Primary Care Provider: Enid Baas Other Clinician: Referring Provider: Treating Provider/Extender: Geralyn Corwin Self, Referral Weeks in Treatment: 0 Constitutional . Cardiovascular . Psychiatric . Notes RAINEE, SWEATT ([id number]) [id number] Page 4 of 11 Left heel: Eschar Right foot: T the anterior aspect there is a wound with eschar o Sacrum: Extensive large wound that tunnels and undermines with nonviable tissue, granulation tissue and exposed bone. Electronic Signature(s) Signed: 05/06/2022 3:12:59 PM By: Geralyn Corwin DO Entered By: Geralyn Corwin on 05/06/2022 11:07:02 -------------------------------------------------------------------------------- Physician Orders Details Patient Name: Date of Service: [name] ND, [name] 05/06/2022 8:45 A M Medical Record Number: [id number] Patient Account Number: [id number] Date of Birth/Sex: Treating RN: Jan 26, 1961 (62 y.o. [name] Cower, Kim Primary Care Provider: Enid Baas Other Clinician: Referring Provider: Treating Provider/Extender: Geralyn Corwin Self, Referral Weeks in Treatment: 0 Verbal / Phone Orders: No Diagnosis Coding ICD-10 Coding Code Description L89.154 Pressure ulcer of sacral region, stage  4 L89.153 Pressure ulcer of sacral region, stage 3 L89.610 Pressure ulcer of right heel, unstageable L89.620 Pressure ulcer of left heel, unstageable G35 Multiple sclerosis I26.99 Other pulmonary embolism without acute cor pulmonale I82.409 Acute embolism and thrombosis of unspecified deep veins of unspecified lower extremity Z79.01 Long term (current) use of anticoagulants Follow-up Appointments Return Appointment in 2 weeks. Home Health Home Health Company: - [facility] for wound care. May utilize formulary equivalent dressing for wound treatment orders unless otherwise specified. Home Health Nurse may visit PRN to address patients wound care needs. Scheduled days for dressing changes to be completed; exception, patient has scheduled wound care visit that day. **Please direct any NON-WOUND related issues/requests for orders to patient's Primary Care Physician. **If current dressing causes regression in wound condition, may D/C ordered dressing product/s and apply Normal Saline Moist Dressing daily until next Wound Healing Center or Other MD appointment. **Notify Wound  Healing Center of regression in wound condition at [phone]. Bathing/ Shower/ Hygiene May shower; gently cleanse wound with antibacterial soap, rinse and pat dry prior to dressing wounds No tub bath. Anesthetic (Use 'Patient Medications' Section for Anesthetic Order Entry) Lidocaine applied to wound bed Off-Loading Roho cushion for wheelchair Hospital bed/mattress A fluidized (Group 3) ir Turn and reposition every 2 hours Other: - Wear offloading boots at all times, no pressure on heels. Float heels on pillow. Additional Orders / Instructions Follow Nutritious Diet and Increase Protein Intake - Increase protein, supplement with Ensure, Boost, etc. Medications-Please add to medication list. [address] Enzymatic Ointment Arnetha GulaVANNOSTRAND, [name] ([id number]) [id number] Page  5 of 11 Other: - 1/4 Strength Dakins Solution Wound Treatment Wound #1 - Foot Wound Laterality: Right, Medial Cleanser: Soap and Water (Home Health) 3 x Per Week/30 Days Discharge Instructions: Gently cleanse wound with antibacterial soap, rinse and pat dry prior to dressing wounds Cleanser: Wound Cleanser (Home Health) 3 x Per Week/30 Days Discharge Instructions: Wash your hands with soap and water. Remove old dressing, discard into plastic bag and place into trash. Cleanse the wound with Wound Cleanser prior to applying a clean dressing using gauze sponges, not tissues or cotton balls. Do not scrub or use excessive force. Pat dry using gauze sponges, not tissue or cotton balls. Topical: Santyl Collagenase Ointment, 30 (gm), tube (Home Health) 3 x Per Week/30 Days Discharge Instructions: apply nickel thick to wound bed only Prim Dressing: Gauze (Home Health) 3 x Per Week/30 Days ary Discharge Instructions: As directed: dry, moistened with saline or moistened with Dakins Solution Secondary Dressing: Kerlix 4.5 x 4.1 (in/yd) (Home Health) 3 x Per Week/30 Days Discharge Instructions: Apply Kerlix 4.5 x 4.1 (in/yd) as instructed Secured With: Medipore T - 9M Medipore H Soft Cloth Surgical T ape ape, 2x2 (in/yd) (Home Health) 3 x Per Week/30 Days Add-Ons: ALLEVYN Heel 4 1/2in x 5 1/2in / 10.5cm x 13.5cm (Home Health) 3 x Per Week/30 Days Discharge Instructions: Apply the white, patterned surface to the heel. Wound #2 - Calcaneus Wound Laterality: Left Cleanser: Soap and Water (Home Health) 3 x Per Week/30 Days Discharge Instructions: Gently cleanse wound with antibacterial soap, rinse and pat dry prior to dressing wounds Cleanser: Wound Cleanser (Home Health) 3 x Per Week/30 Days Discharge Instructions: Wash your hands with soap and water. Remove old dressing, discard into plastic bag and place into trash. Cleanse the wound with Wound Cleanser prior to applying a clean dressing using gauze  sponges, not tissues or cotton balls. Do not scrub or use excessive force. Pat dry using gauze sponges, not tissue or cotton balls. Topical: Santyl Collagenase Ointment, 30 (gm), tube (Home Health) 3 x Per Week/30 Days Discharge Instructions: apply nickel thick to wound bed only Prim Dressing: Gauze (Home Health) 3 x Per Week/30 Days ary Discharge Instructions: As directed: dry, moistened with saline or moistened with Dakins Solution Secondary Dressing: Kerlix 4.5 x 4.1 (in/yd) (Home Health) 3 x Per Week/30 Days Discharge Instructions: Apply Kerlix 4.5 x 4.1 (in/yd) as instructed Secured With: Medipore T - 9M Medipore H Soft Cloth Surgical T ape ape, 2x2 (in/yd) (Home Health) 3 x Per Week/30 Days Add-Ons: ALLEVYN Heel 4 1/2in x 5 1/2in / 10.5cm x 13.5cm (Home Health) 3 x Per Week/30 Days Discharge Instructions: Apply the white, patterned surface to the heel. Wound #3 - Sacrum Wound Laterality: Midline, Proximal Cleanser: Soap and Water (Home Health) 1 x Per Day/30 Days Discharge Instructions: Gently cleanse wound with antibacterial soap,  rinse and pat dry prior to dressing wounds Cleanser: Wound Cleanser (Home Health) 1 x Per Day/30 Days Discharge Instructions: Wash your hands with soap and water. Remove old dressing, discard into plastic bag and place into trash. Cleanse the wound with Wound Cleanser prior to applying a clean dressing using gauze sponges, not tissues or cotton balls. Do not scrub or use excessive force. Pat dry using gauze sponges, not tissue or cotton balls. Secondary Dressing: Conforming Guaze Roll-Medium [facility]) (Generic) 1 x Per Day/30 Days Discharge Instructions: Pack lightly with rolled gauze soaked in Dakins solution. Secured With: Bordered foam dressing. (Home Health) (Generic) 1 x Per Day/30 Days Wound #4 - Sacrum Wound Laterality: Midline, Distal Cleanser: Soap and Water (Home Health) 1 x Per Day/30 Days Discharge Instructions: Gently cleanse wound with  antibacterial soap, rinse and pat dry prior to dressing wounds Cleanser: Wound Cleanser (Home Health) 1 x Per Day/30 Days Discharge Instructions: Wash your hands with soap and water. Remove old dressing, discard into plastic bag and place into trash. Cleanse the wound with Wound Cleanser prior to applying a clean dressing using gauze sponges, not tissues or cotton balls. Do not scrub or use excessive force. Pat dry using gauze sponges, not tissue or cotton balls. Secondary Dressing: Conforming Guaze Roll-Medium [facility]) (Generic) 1 x Per Day/30 Days Discharge Instructions: Pack lightly with rolled gauze soaked in Dakins solution. Secured With: Bordered foam dressing. [facility]) (Generic) 1 x Per Day/30 Days Discharge Instructions: cover to secure [name], [name] ([id number]) [id number] Page 6 of 11 Laboratory Bacteria identified in Wound by Culture (MICRO) - Sacrum tissue/bone LOINC Code: [phone] Convenience Name: Wound culture routine Bacteria identified in Tissue by Biopsy culture (MICRO) - Bone from sacrum LOINC Code: 20474-3 Convenience Name: Biopsy specimen culture Patient Medications llergies: erythromycin base, Lexapro A Notifications Medication Indication Start End 05/06/2022 Santyl DOSE 1 - topical 250 unit/gram ointment - apply daily to the wound bed Electronic Signature(s) Signed: 05/06/2022 3:12:59 PM By: Geralyn Corwin DO Signed: 05/06/2022 5:47:34 PM By: Elliot Gurney, BSN, RN, CWS, Kim RN, BSN Previous Signature: 05/06/2022 1:08:17 PM Version By: Elliot Gurney, BSN, RN, CWS, Kim RN, BSN Previous Signature: 05/06/2022 10:51:47 AM Version By: Geralyn Corwin DO Entered By: Elliot Gurney BSN, RN, CWS, Kim on 05/06/2022 13:13:42 -------------------------------------------------------------------------------- Problem List Details Patient Name: Date of Service: [name] STRA ND, [name] 05/06/2022 8:45 A M Medical Record Number: [id number] Patient Account  Number: [id number] Date of Birth/Sex: Treating RN: Nov 21, 1960 (62 y.o. [name] Primary Care Provider: Enid Baas Other Clinician: Referring Provider: Treating Provider/Extender: Geralyn Corwin Self, Referral Weeks in Treatment: 0 Active Problems ICD-10 Encounter Code Description Active Date MDM Diagnosis L89.154 Pressure ulcer of sacral region, stage 4 05/06/2022 No Yes L89.153 Pressure ulcer of sacral region, stage 3 05/06/2022 No Yes L89.610 Pressure ulcer of right heel, unstageable 05/06/2022 No Yes L89.620 Pressure ulcer of left heel, unstageable 05/06/2022 No Yes G35 Multiple sclerosis 05/06/2022 No Yes DYONNA, JASPERS ([id number]) [id number] Page 7 of 11 I26.99 Other pulmonary embolism without acute cor pulmonale 05/06/2022 No Yes I82.409 Acute embolism and thrombosis of unspecified deep veins of unspecified 05/06/2022 No Yes lower extremity Z79.01 Long term (current) use of anticoagulants 05/06/2022 No Yes Inactive Problems Resolved Problems Electronic Signature(s) Signed: 05/06/2022 3:12:59 PM By: Geralyn Corwin DO Entered By: Geralyn Corwin on 05/06/2022 10:52:07 -------------------------------------------------------------------------------- Progress Note Details Patient Name: Date of Service: [name] ND, [name] 05/06/2022 8:45 A M Medical Record Number: [id number] Patient Account Number: [id number] Date  of Birth/Sex: Treating RN: Aug 17, 1960 (62 y.o. [name] Primary Care Provider: Enid Baas Other Clinician: Referring Provider: Treating Provider/Extender: Geralyn Corwin Self, Referral Weeks in Treatment: 0 Subjective Chief Complaint Information obtained from Patient 05/06/2022; Bilateral feet wounds and sacral wounds History of Present Illness (HPI) 05/06/2022 [name] is a 62 year old female with a past medical history of multiple sclerosis and PEs and DVT that presents to the  clinic for sacral s wounds and Bilateral feet wounds. Patient states that she obtained the sacral ulcers when she was hospitalized in November 2023 for bilateral pulmonary embolisms. She was in the ICU on the ventilator. Since discharge she has been using Dakin's wet-to-dry dressings to the area. Since her hospitalization she has been bedbound. However she is doing physical therapy. Prior to being hospitalized she was able to pull herself up and transfer from bed to chair on her own. Due to her MS she is not fully mobile. Unfortunately she states that the wound has gotten larger on her sacrum despite Wound care. Patient has home health. On 04/16/2022 she was admitted to the hospital for severe sepsis secondary to acute UTI and sacral cellulitis. She completed 5 days of IV cefepime and vancomycin. She was not discharged with oral antibiotics. She states she developed the bilateral feet wounds at that time. She states she has bunny boots however she is not wearing them. She currently denies systemic signs of infection. Patient History Information obtained from Patient. Allergies erythromycin base, Lexapro Social History Never smoker, Marital Status - Widowed, Alcohol Use - Never, Caffeine Use - Daily. Medical History Cardiovascular Patient has history of Angina, Hypotension [name], [name] ([id number]) [id number] Page 8 of 11 Endocrine Denies history of Type II Diabetes Integumentary (Skin) Denies history of History of pressure wounds Medical A Surgical History Notes nd Respiratory pulmonary hemorrage Review of Systems (ROS) Hematologic/Lymphatic Complains or has symptoms of Bleeding / Clotting Disorders. Integumentary (Skin) Complains or has symptoms of Wounds, Bleeding or bruising tendency. Musculoskeletal MS Psychiatric Complains or has symptoms of Anxiety. Objective Constitutional Vitals Time Taken: 9:14 AM, Temperature: 97.7 F, Pulse: 106  bpm, Respiratory Rate: 16 breaths/min, Blood Pressure: 85/56 mmHg. General Notes: Left heel: Eschar Right foot: T the anterior aspect there is a wound with eschar Sacrum: Extensive large wound that tunnels and undermines o with nonviable tissue, granulation tissue and exposed bone. Integumentary (Hair, Skin) Wound #1 status is Open. Original cause of wound was Pressure Injury. The date acquired was: 03/12/2022. The wound is located on the Right,Medial Foot. The wound measures 2.4cm length x 1cm width x 0.1cm depth; 1.885cm^2 area and 0.188cm^3 volume. There is no granulation within the wound bed. There is a large (67-100%) amount of necrotic tissue within the wound bed including Eschar. Wound #2 status is Open. Original cause of wound was Pressure Injury. The date acquired was: 03/12/2022. The wound is located on the Left Calcaneus. The wound measures 2.8cm length x 4.2cm width x 0.1cm depth; 9.236cm^2 area and 0.924cm^3 volume. There is no granulation within the wound bed. There is a large (67-100%) amount of necrotic tissue within the wound bed including Eschar. Wound #3 status is Open. Original cause of wound was Pressure Injury. The date acquired was: 03/09/2022. The wound is located on the Proximal,Midline Sacrum. The wound measures 7.5cm length x 4cm width x 6cm depth; 23.562cm^2 area and 141.372cm^3 volume. There is undermining starting at 10:00 and ending at 5:00 with a maximum distance of 12.5cm. There is small (1-33%) granulation within the  wound bed. There is a large (67-100%) amount of necrotic tissue within the wound bed including Eschar. Wound #4 status is Open. Original cause of wound was Pressure Injury. The date acquired was: 03/12/2022. The wound is located on the Distal,Midline Sacrum. The wound measures 3cm length x 2.5cm width x 0.2cm depth; 5.89cm^2 area and 1.178cm^3 volume. There is Fat Layer (Subcutaneous Tissue) exposed. There is small (1-33%) granulation within the wound  bed. There is a large (67-100%) amount of necrotic tissue within the wound bed including Adherent Slough. Assessment Active Problems ICD-10 Pressure ulcer of sacral region, stage 4 Pressure ulcer of sacral region, stage 3 Pressure ulcer of right heel, unstageable Pressure ulcer of left heel, unstageable Multiple sclerosis Other pulmonary embolism without acute cor pulmonale Acute embolism and thrombosis of unspecified deep veins of unspecified lower extremity Long term (current) use of anticoagulants Patient presents with pressure ulcers to her sacrum and feet bilaterally. Her mobility is limited due to her multiple sclerosis. Unfortunately she has been completely bedbound since her hospitalization in November. We had a long discussion about the importance of aggressive offloading for her wound healing. She can reposition herself from side-to-side. I was able to obtain a bone biopsy and culture today. 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 For now she can continue Dakin's wet-to-dry. T the heels I recommended she wear bunny boots at all o times. I debrided nonviable tissue and recommended Santyl to the areas. Procedures Wound #2 Pre-procedure diagnosis of Wound #2 is a Pressure Ulcer located on the Left Calcaneus . There was a Selective/Open Wound Non-Viable Tissue Debridement with a total area of 11.76 sq cm performed by Geralyn Corwin, MD. With the following instrument(s): Curette, and Rongeur to remove Non-Viable GWENDA, [name] ([id number]) [id number] Page 9 of 11 tissue/material. Material removed includes Eschar after achieving pain control using Lidocaine. No specimens were taken. A time out was conducted at 10:15, prior to the start of the procedure. There was no bleeding. The procedure was tolerated well. Post Debridement Measurements: 2.8cm length x 4.2cm width x 0.1cm depth;  0.924cm^3 volume. Post debridement Stage noted as Unstageable/Unclassified. Character of Wound/Ulcer Post Debridement is stable. Post procedure Diagnosis Wound #2: Same as Pre-Procedure General Notes: scraped to allow santyl to penetrate wound.. Wound #3 Pre-procedure diagnosis of Wound #3 is a Pressure Ulcer located on the Proximal,Midline Sacrum . There was a Excisional Skin/Subcutaneous Tissue/Muscle/Bone Debridement with a total area of 30 sq cm performed by Geralyn Corwin, MD. With the following instrument(s): Rongeur to remove Non-Viable tissue/material. Material removed includes Bone after achieving pain control using Lidocaine. 2 specimens were taken by a Swab and Tissue Culture and sent to the lab per facility protocol. A time out was conducted at 10:15, prior to the start of the procedure. A Minimum amount of bleeding was controlled with Pressure. The procedure was tolerated well. Post Debridement Measurements: 7.5cm length x 4cm width x 6cm depth; 141.372cm^3 volume. Post debridement Stage noted as Category/Stage IV. Character of Wound/Ulcer Post Debridement is stable. Post procedure Diagnosis Wound #3: Same as Pre-Procedure Plan The following medication(s) was prescribed: Santyl topical 250 unit/gram ointment 1 apply daily to the wound bed starting 05/06/2022 1. Bone biopsy with culture 2. Dakin's wet-to-dry dressings 3. Aggressive offloading 4. Santyl 5. Bunny boots 6. Follow-up in 2 weeks Electronic Signature(s) Signed: 05/06/2022 3:12:59 PM By: Geralyn Corwin DO Entered By: Geralyn Corwin on 05/06/2022 11:13:13 -------------------------------------------------------------------------------- ROS/PFSH Details Patient Name: Date of  Service: [name] STRA ND, [name] 05/06/2022 8:45 A M Medical Record Number: [id number] Patient Account Number: [id number] Date of Birth/Sex: Treating RN: 1961/02/27 (62 y.o. [name] Primary Care Provider: Gladstone Lighter Other  Clinician: Referring Provider: Treating Provider/Extender: Kalman Shan Self, Referral Weeks in Treatment: 0 Information Obtained From Patient Hematologic/Lymphatic Complaints and Symptoms: Positive for: Bleeding / Clotting Disorders Integumentary (Skin) Complaints and Symptoms: Positive for: Wounds; Bleeding or bruising tendency Medical History: Negative for: History of pressure wounds Psychiatric [name], [name] ([id number]) [id number] Page 10 of 11 Complaints and Symptoms: Positive for: Anxiety Respiratory Medical History: Past Medical History Notes: pulmonary hemorrage Cardiovascular Medical History: Positive for: Angina; Hypotension Endocrine Medical History: Negative for: Type II Diabetes Musculoskeletal Complaints and Symptoms: Review of System Notes: MS Neurologic Medical History: Past Medical History Notes: MS Immunizations Pneumococcal Vaccine: Received Pneumococcal Vaccination: No Implantable Devices None Family and Social History Never smoker; Marital Status - Widowed; Alcohol Use: Never; Caffeine Use: Daily Electronic Signature(s) Signed: 05/07/2022 3:12:14 PM By: Gretta Cool, BSN, RN, CWS, Kim RN, BSN Signed: 05/08/2022 11:11:40 AM By: Kalman Shan DO Previous Signature: 05/06/2022 3:12:59 PM Version By: Kalman Shan DO Previous Signature: 05/06/2022 5:47:34 PM Version By: Gretta Cool, BSN, RN, CWS, Kim RN, BSN Entered By: Gretta Cool, BSN, RN, CWS, Kim on 05/07/2022 10:05:34 -------------------------------------------------------------------------------- SuperBill Details Patient Name: Date of Service: [name] ND, [name] 05/06/2022 Medical Record Number: [id number] Patient Account Number: [id number] Date of Birth/Sex: Treating RN: 02-25-1961 (62 y.o. [name] Primary Care Provider: Gladstone Lighter Other Clinician: Referring Provider: Treating Provider/Extender: Kalman Shan Self, Referral Weeks in  Treatment: 0 Diagnosis Coding ICD-10 Codes Code Description VELORA, HORSTMAN ([id number]) [id number] Page 11 of 11 L89.154 Pressure ulcer of sacral region, stage 4 L89.153 Pressure ulcer of sacral region, stage 3 L89.610 Pressure ulcer of right heel, unstageable L89.620 Pressure ulcer of left heel, unstageable G35 Multiple sclerosis I26.99 Other pulmonary embolism without acute cor pulmonale I82.409 Acute embolism and thrombosis of unspecified deep veins of unspecified lower extremity Z79.01 Long term (current) use of anticoagulants Facility Procedures : CPT4 Code: 01027253 Description: 99213 - WOUND CARE VISIT-LEV 3 EST PT Modifier: Quantity: 1 : CPT4 Code: 66440347 Description: 42595 - DEB BONE 20 SQ CM/< ICD-10 Diagnosis Description L89.154 Pressure ulcer of sacral region, stage 4 Modifier: Quantity: 1 : CPT4 Code: 63875643 Description: 32951 - DEBRIDE WOUND 1ST 20 SQ CM OR < ICD-10 Diagnosis Description L89.620 Pressure ulcer of left heel, unstageable Modifier: Quantity: 1 Physician Procedures : CPT4: Description Modifier Code [id number] 99204 - WC PHYS LEVEL 4 - NEW PT ICD-10 Diagnosis Description L89.154 Pressure ulcer of sacral region, stage 4 L89.153 Pressure ulcer of sacral region, stage 3 L89.610 Pressure ulcer of right heel, unstageable  L89.620 Pressure ulcer of left heel, unstageable Quantity: 1 : CPT4: 6301601 Debridement; bone (includes epidermis, dermis, subQ tissue, muscle and/or fascia, if performed) 1st 20 sqcm or less ICD-10 Diagnosis Description L89.154 Pressure ulcer of sacral region, stage 4 Quantity: 1 : CPT4: 0932355 73220 - WC PHYS DEBR WO ANESTH 20 SQ CM ICD-10 Diagnosis Description L89.620 Pressure ulcer of left heel, unstageable Quantity: 1 Electronic Signature(s) Signed: 05/06/2022 1:32:56 PM By: Gretta Cool, BSN, RN, CWS, Kim RN, BSN Signed: 05/06/2022 3:12:59 PM By: Kalman Shan DO Previous Signature: 05/06/2022  1:32:28 PM Version By: Gretta Cool, BSN, RN, CWS, Kim RN, BSN Entered By: Gretta Cool, BSN, RN, CWS, Kim on 05/06/2022 13:32:55

## 2022-05-07 NOTE — Progress Notes (Signed)
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 Structure Granulation Quality: Red, Hyper-granulation Fascia Exposed: No Necrotic Amount: Large (67-100%) Fat Layer (Subcutaneous Tissue) Exposed: Yes Necrotic Quality: Adherent Slough Tendon Exposed: No Muscle Exposed: Yes Necrosis of Muscle: No Joint Exposed: No Bone Exposed: Yes Treatment Notes Wound #3 (Sacrum) Wound Laterality: Midline, Proximal Cleanser Soap and Water Discharge Instruction: Gently cleanse wound with antibacterial soap, rinse and pat dry prior to dressing wounds Wound Cleanser Discharge Instruction: Wash your hands with soap and water. Remove old dressing, discard into plastic bag and place into trash. Cleanse the wound with Wound Cleanser prior to applying a clean dressing using gauze sponges, not tissues or cotton balls. Do not scrub or use excessive force. Pat dry using gauze sponges, not tissue or cotton balls. Peri-Wound Care Topical Primary Dressing Secondary Dressing Conforming [place] Roll-Medium Discharge Instruction: Pack lightly with rolled gauze soaked in Dakins solution. Secured With Bordered foam dressing. Compression Wrap Compression Stockings MAHALEY, SCHWERING ([id number]) [id number] Page 13 of 14 Add-Ons Electronic Signature(s) Signed: 05/06/2022 12:48:12 PM By: Elliot Gurney, BSN, RN, CWS,  Kim RN, BSN Entered By: Elliot Gurney, BSN, RN, CWS, Kim on 05/06/2022 12:48:11 -------------------------------------------------------------------------------- Wound Assessment Details Patient Name: Date of Service: [name] Glen [name], [name] 05/06/2022 8:45 A M Medical Record Number: [id number] Patient Account Number: [id number] Date of Birth/Sex: Treating RN: [birth year] (62 y.o. Cathlean Cower, Kim Primary Care Jessen Siegman: Enid Baas Other Clinician: Referring Kynlei Piontek: Treating Montrelle Eddings/Extender: [name], Referral Weeks in Treatment: 0 Wound Status Wound Number: 4 Primary Etiology: Pressure Ulcer Wound Location: Distal, Midline Sacrum Wound Status: Open Wounding Event: Pressure Injury Comorbid History: Angina, Hypotension Date Acquired: 03/12/2022 Weeks Of Treatment: 0 Clustered Wound: No Photos Wound Measurements Length: (cm) Width: (cm) Depth: (cm) Area: (cm) Volume: (cm) 3 % Reduction in Area: 2.5 % Reduction in Volume: 0.2 5.89 1.178 Wound Description Classification: Category/Stage III Exudate Amount: Medium Exudate Type: Serosanguineous Exudate Color: red, brown Foul Odor After Cleansing: No Slough/Fibrino Yes Wound Bed Granulation Amount: Small (1-33%) Exposed Structure Granulation Quality: Red Fascia Exposed: No Necrotic Amount: Large (67-100%) Fat Layer (Subcutaneous Tissue) Exposed: Yes Necrotic Quality: Adherent Slough Tendon Exposed: No Muscle Exposed: No Joint Exposed: No Bone Exposed: No [name] Gula ([id number]) [id number] Page 14 of 14 Treatment Notes Wound #4 (Sacrum) Wound Laterality: Midline, Distal Cleanser Soap and Water Discharge Instruction: Gently cleanse wound with antibacterial soap, rinse and pat dry prior to dressing wounds Wound Cleanser  Discharge Instruction: Wash your hands with soap and water. Remove old dressing, discard into plastic bag and place into trash. Cleanse the wound  with Wound Cleanser prior to applying a clean dressing using gauze sponges, not tissues or cotton balls. Do not scrub or use excessive force. Pat dry using gauze sponges, not tissue or cotton balls. Peri-Wound Care Topical Primary Dressing Secondary Dressing Conforming [place] Roll-Medium Discharge Instruction: Pack lightly with rolled gauze soaked in Dakins solution. Secured With Bordered foam dressing. Discharge Instruction: cover to secure Compression Wrap Compression Stockings Add-Ons Electronic Signature(s) Signed: 05/06/2022 12:48:51 PM By: Gretta Cool, BSN, RN, CWS, Kim RN, BSN Entered By: Gretta Cool, BSN, RN, CWS, Kim on 05/06/2022 12:48:51 -------------------------------------------------------------------------------- [place] Details Patient Name: Date of Service: [name] [name], [name] 05/06/2022 8:45 A M Medical Record Number: [id number] Patient Account Number: [id number] Date of Birth/Sex: Treating RN: 04-Dec-1960 (62 y.o. [name] Primary Care July Linam: Gladstone Lighter Other Clinician: Referring Clark Clowdus: Treating Tanyah Debruyne/Extender: [name], Referral Weeks in Treatment: 0 Vital Signs Time Taken: 09:14 Temperature (F): 97.7 Unable to obtain height and weight: Medical Reason Pulse (bpm): 106 Respiratory Rate (breaths/min): 16 Blood Pressure (mmHg): 85/56 Reference Range: 80 - 120 mg / dl Electronic Signature(s) Signed: 05/06/2022 5:47:34 PM By: Gretta Cool, BSN, RN, CWS, Kim RN, BSN Entered By: Gretta Cool, BSN, RN, CWS, Kim on 05/06/2022 09:14:45

## 2022-05-08 LAB — SURGICAL PATHOLOGY

## 2022-05-09 LAB — AEROBIC CULTURE W GRAM STAIN (SUPERFICIAL SPECIMEN): Gram Stain: NONE SEEN

## 2022-05-13 ENCOUNTER — Ambulatory Visit: Payer: Medicare Other | Admitting: Internal Medicine

## 2022-05-20 ENCOUNTER — Encounter: Payer: Medicare Other | Attending: Internal Medicine | Admitting: Internal Medicine

## 2022-05-20 DIAGNOSIS — Z7901 Long term (current) use of anticoagulants: Secondary | ICD-10-CM | POA: Insufficient documentation

## 2022-05-20 DIAGNOSIS — L89153 Pressure ulcer of sacral region, stage 3: Secondary | ICD-10-CM | POA: Diagnosis not present

## 2022-05-20 DIAGNOSIS — L89610 Pressure ulcer of right heel, unstageable: Secondary | ICD-10-CM | POA: Diagnosis not present

## 2022-05-20 DIAGNOSIS — G35 Multiple sclerosis: Secondary | ICD-10-CM | POA: Diagnosis not present

## 2022-05-20 DIAGNOSIS — M4628 Osteomyelitis of vertebra, sacral and sacrococcygeal region: Secondary | ICD-10-CM

## 2022-05-20 DIAGNOSIS — Z86711 Personal history of pulmonary embolism: Secondary | ICD-10-CM | POA: Insufficient documentation

## 2022-05-20 DIAGNOSIS — Z86718 Personal history of other venous thrombosis and embolism: Secondary | ICD-10-CM | POA: Insufficient documentation

## 2022-05-20 DIAGNOSIS — L89154 Pressure ulcer of sacral region, stage 4: Secondary | ICD-10-CM

## 2022-05-20 DIAGNOSIS — L89620 Pressure ulcer of left heel, unstageable: Secondary | ICD-10-CM

## 2022-05-20 NOTE — Progress Notes (Addendum)
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 Vital Signs Time Taken: 12:40 Temperature (F): 98.2 Pulse (bpm): 112 Respiratory Rate (breaths/min): 18 Blood Pressure (mmHg): 101/66 Reference Range: 80 - 120 mg / dl Electronic Signature(s) Signed: 05/20/2022 12:59:23 PM By: Carlene Coria RN Entered By: Carlene Coria on 05/20/2022 12:59:23

## 2022-05-22 NOTE — Progress Notes (Signed)
[name], [name] ([id number]) [id number] Page 1 of 9 Visit Report for 05/20/2022 Chief Complaint Document Details Patient Name: Date of Service: Kennieth Rad STRA ND, [name] 05/20/2022 12:45 PM Medical Record Number: [id number] Patient Account Number: [id number] Date of Birth/Sex: Treating RN: 03/25/1961 (62 y.o. [name] Primary Care Provider: Gladstone Lighter Other Clinician: Referring Provider: Treating Provider/Extender: Milda Smart Weeks in Treatment: 2 Information Obtained from: Patient Chief Complaint 05/06/2022; Bilateral feet wounds and sacral wounds Electronic Signature(s) Signed: 05/20/2022 2:40:27 PM By: Kalman Shan DO Entered By: Kalman Shan on 05/20/2022 13:39:18 -------------------------------------------------------------------------------- Debridement Details Patient Name: Date of Service: [name] ND, [name] 05/20/2022 12:45 PM Medical Record Number: [id number] Patient Account Number: [id number] Date of Birth/Sex: Treating RN: [birth year] (62 y.o. [name] Primary Care Provider: Gladstone Lighter Other Clinician: Referring Provider: Treating Provider/Extender: Milda Smart Weeks in Treatment: 2 Debridement Performed for Assessment: Wound #2 Left Calcaneus Performed By: Physician Kalman Shan, MD Debridement Type: Debridement Level of Consciousness (Pre-procedure): Awake and Alert Pre-procedure Verification/Time Out Yes - 13:15 Taken: Start Time: 13:15 T Area Debrided (L x W): otal 3 (cm) x 2 (cm) = 6 (cm) Tissue and other material debrided: Viable, Non-Viable, Eschar, Skin: Dermis , Skin: Epidermis Level: Skin/Epidermis Debridement Description: Selective/Open Wound Instrument: Curette Bleeding: Minimum Hemostasis Achieved: Pressure End Time: 13:17 Procedural Pain: 0 Post Procedural Pain: 0 Response to Treatment: Procedure was tolerated  well Level of Consciousness [name], [name] ([id number]) [id number] Page 2 of 9 Level of Consciousness (Post- Awake and Alert procedure): Post Debridement Measurements of Total Wound Length: (cm) 3 Stage: Unstageable/Unclassified Width: (cm) 2 Depth: (cm) 0.1 Volume: (cm) 0.471 Character of Wound/Ulcer Post Debridement: Improved Post Procedure Diagnosis Same as Pre-procedure Electronic Signature(s) Signed: 05/20/2022 2:40:27 PM By: Kalman Shan DO Signed: 05/22/2022 12:08:06 PM By: Carlene Coria RN Entered By: Carlene Coria on 05/20/2022 13:20:57 -------------------------------------------------------------------------------- HPI Details Patient Name: Date of Service: [name] ND, [name] 05/20/2022 12:45 PM Medical Record Number: [id number] Patient Account Number: [id number] Date of Birth/Sex: Treating RN: 1960-12-05 (62 y.o. [name] Primary Care Provider: Gladstone Lighter Other Clinician: Referring Provider: Treating Provider/Extender: Milda Smart Weeks in Treatment: 2 History of Present Illness HPI Description: 05/06/2022 [name] is a 62 year old female with a past medical history of multiple sclerosis and PEs and DVT that presents to the clinic for sacral s wounds and Bilateral feet wounds. Patient states that she obtained the sacral ulcers when she was hospitalized in November 2023 for bilateral pulmonary embolisms. She was in the ICU on a ventilator. Since discharge she has been using Dakin's wet-to-dry dressings to the area. Since her hospitalization she has been bedbound. However she is doing physical therapy. Prior to being hospitalized she was able to pull herself up and transfer from bed to chair on her own. Due to her MS she is not fully mobile. Unfortunately she states that the wound has gotten larger on her sacrum despite Wound care. Patient has home health. On 04/16/2022  she was admitted to the hospital for severe sepsis secondary to acute UTI and sacral cellulitis. She completed 5 days of IV cefepime and vancomycin. She was not discharged with oral antibiotics. She states she developed the bilateral feet wounds at that time. She states she has bunny boots however she is not wearing them. She currently denies systemic signs of infection. 2/7; patient presents for follow-up. She had a bone biopsy done at last clinic visit  that showed fragments of inflamed granulation tissue, necrotic material and bone with acute osteomyelitis. Culture grew Proteus mirabilis and Klebsiella pneumoniae. Patient is scheduled to see infectious disease next week. For now she has been doing Dakin's wet-to-dry dressings to the sacrum. She is not able to obtain Santyl and has been keeping the left heel covered. T the right foot where o there was a previous wound with scab however this has healed. This has remained closed. No open wound noted. She states over the past week she has had chills but no fevers, nausea/vomiting or purulent drainage. Electronic Signature(s) Signed: 05/20/2022 2:40:27 PM By: Kalman Shan DO Entered By: Kalman Shan on 05/20/2022 13:40:18 Susette Racer ([id number]) [id number] Page 3 of 9 -------------------------------------------------------------------------------- Physical Exam Details Patient Name: Date of Service: [name] ND, [name] 05/20/2022 12:45 PM Medical Record Number: [id number] Patient Account Number: [id number] Date of Birth/Sex: Treating RN: 24-Oct-1960 (62 y.o. [name] Primary Care Provider: Gladstone Lighter Other Clinician: Referring Provider: Treating Provider/Extender: Milda Smart Weeks in Treatment: 2 Constitutional . Cardiovascular . Psychiatric . Notes Left heel: Eschar Right foot: T the anterior aspect there is no open wound. Sacrum: Extensive large  wound that tunnels and undermines with nonviable tissue, o granulation tissue and exposed bone. Electronic Signature(s) Signed: 05/20/2022 2:40:27 PM By: Kalman Shan DO Entered By: Kalman Shan on 05/20/2022 13:42:19 -------------------------------------------------------------------------------- Physician Orders Details Patient Name: Date of Service: [name] ND, [name] 05/20/2022 12:45 PM Medical Record Number: [id number] Patient Account Number: [id number] Date of Birth/Sex: Treating RN: 21-Dec-1960 (62 y.o. [name] Primary Care Provider: Gladstone Lighter Other Clinician: Referring Provider: Treating Provider/Extender: Milda Smart Weeks in Treatment: 2 Verbal / Phone Orders: No Diagnosis Coding Follow-up Appointments Return Appointment in 2 weeks. [place]: - [facility] [phone] [place] for wound care. May utilize formulary equivalent dressing for wound treatment orders unless otherwise specified. Home Health Nurse may visit PRN to address patients wound care needs. Scheduled days for dressing changes to be completed; exception, patient has scheduled wound care visit that day. **Please direct any NON-WOUND related issues/requests for orders to patient's Primary Care Physician. **If current dressing causes regression in wound condition, may D/C ordered dressing product/s and apply Normal Saline Moist Dressing daily until next [place] or Other MD appointment. **Notify [facility] of regression in wound condition at [phone]. ROMOLA, WORKS ([id number]) [id number] Page 4 of 9 Bathing/ Shower/ Hygiene May shower; gently cleanse wound with antibacterial soap, rinse and pat dry prior to dressing wounds No tub bath. Anesthetic (Use 'Patient Medications' Section for Anesthetic Order Entry) Lidocaine applied to wound bed Off-Loading Roho  cushion for wheelchair Hospital bed/mattress A fluidized (Group 3) ir Turn and reposition every 2 hours Other: - Wear offloading boots at all times, no pressure on heels. Float heels on pillow. Additional Orders / Instructions Follow Nutritious Diet and Increase Protein Intake - Increase protein, supplement with Ensure, Boost, etc. Wound Treatment Wound #2 - Calcaneus Wound Laterality: Left Cleanser: Soap and Water (Home Health) 1 x Per Day/30 Days Discharge Instructions: Gently cleanse wound with antibacterial soap, rinse and pat dry prior to dressing wounds Cleanser: Wound Cleanser ([place]) 1 x Per Day/30 Days Discharge Instructions: Wash your hands with soap and water. Remove old dressing, discard into plastic bag and place into trash. Cleanse the wound with Wound Cleanser prior to applying a clean dressing using gauze sponges, not tissues or cotton balls.  Do not scrub or use excessive force. Pat dry using gauze sponges, not tissue or cotton balls. Topical: Activon [name] Gel, 25 (g) Tube 1 x Per Day/30 Days Prim Dressing: Hydrofera Blue Ready Transfer Foam, 2.5x2.5 (in/in) 1 x Per Day/30 Days ary Discharge Instructions: Apply Hydrofera Blue Ready to wound bed as directed Secondary Dressing: Kerlix 4.5 x 4.1 (in/yd) ([place]) 1 x Per Day/30 Days Discharge Instructions: Apply Kerlix 4.5 x 4.1 (in/yd) as instructed Secured With: Medipore T - 30M Medipore H Soft Cloth Surgical T ape ape, 2x2 (in/yd) ([place]) 1 x Per Day/30 Days Add-Ons: ALLEVYN Heel 4 1/2in x 5 1/2in / 10.5cm x 13.5cm (Home Health) 1 x Per Day/30 Days Discharge Instructions: Apply the white, patterned surface to the heel. Wound #3 - Sacrum Wound Laterality: Midline, Proximal Cleanser: Soap and Water (Home Health) 1 x Per Day/30 Days Discharge Instructions: Gently cleanse wound with antibacterial soap, rinse and pat dry prior to dressing wounds Cleanser: Wound Cleanser ([place]) 1 x Per Day/30  Days Discharge Instructions: Wash your hands with soap and water. Remove old dressing, discard into plastic bag and place into trash. Cleanse the wound with Wound Cleanser prior to applying a clean dressing using gauze sponges, not tissues or cotton balls. Do not scrub or use excessive force. Pat dry using gauze sponges, not tissue or cotton balls. Secondary Dressing: Conforming Guaze Roll-Medium [facility]) (Generic) 1 x Per Day/30 Days Discharge Instructions: Pack lightly with rolled gauze soaked in Dakins solution. Secured With: Bordered foam dressing. (Home Health) (Generic) 1 x Per Day/30 Days Wound #4 - Sacrum Wound Laterality: Midline, Distal Cleanser: Soap and Water (Home Health) 1 x Per Day/30 Days Discharge Instructions: Gently cleanse wound with antibacterial soap, rinse and pat dry prior to dressing wounds Cleanser: Wound Cleanser ([place]) 1 x Per Day/30 Days Discharge Instructions: Wash your hands with soap and water. Remove old dressing, discard into plastic bag and place into trash. Cleanse the wound with Wound Cleanser prior to applying a clean dressing using gauze sponges, not tissues or cotton balls. Do not scrub or use excessive force. Pat dry using gauze sponges, not tissue or cotton balls. Secondary Dressing: Conforming Guaze Roll-Medium [facility]) (Generic) 1 x Per Day/30 Days Discharge Instructions: Pack lightly with rolled gauze soaked in Dakins solution. Secured With: Bordered foam dressing. (Home Health) (Generic) 1 x Per Day/30 Days Discharge Instructions: cover to secure Electronic Signature(s) Signed: 05/20/2022 2:40:27 PM By: Kalman Shan DO Signed: 05/22/2022 12:08:06 PM By: Carlene Coria RN Entered By: Carlene Coria on 05/20/2022 13:46:15 Susette Racer ([id number]) [id number] Page 5 of 9 -------------------------------------------------------------------------------- Problem List Details Patient Name: Date of  Service: [name] ND, [name] 05/20/2022 12:45 PM Medical Record Number: [id number] Patient Account Number: [id number] Date of Birth/Sex: Treating RN: Oct 31, 1960 (62 y.o. [name] Primary Care Provider: Gladstone Lighter Other Clinician: Referring Provider: Treating Provider/Extender: Milda Smart Weeks in Treatment: 2 Active Problems ICD-10 Encounter Code Description Active Date MDM Diagnosis L89.154 Pressure ulcer of sacral region, stage 4 05/06/2022 No Yes L89.153 Pressure ulcer of sacral region, stage 3 05/06/2022 No Yes L89.610 Pressure ulcer of right heel, unstageable 05/06/2022 No Yes L89.620 Pressure ulcer of left heel, unstageable 05/06/2022 No Yes G35 Multiple sclerosis 05/06/2022 No Yes I26.99 Other pulmonary embolism without acute cor pulmonale 05/06/2022 No Yes I82.409 Acute embolism and thrombosis of unspecified deep veins of unspecified 05/06/2022 No Yes lower extremity Z79.01 Long term (current) use of anticoagulants 05/06/2022 No  Yes 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, sacral and sacrococcygeal region 05/20/2022 No Yes Inactive Problems Resolved Problems Electronic Signature(s) Signed: 05/20/2022 2:40:27 PM By: Kalman Shan DO Entered By: Kalman Shan on 05/20/2022 13:39:14 Susette Racer ([id number]) [id number] Page 6 of 9 -------------------------------------------------------------------------------- Progress Note Details Patient Name: Date of Service: [name] ND, [name] 05/20/2022 12:45 PM Medical Record Number: [id number] Patient Account Number: [id number] Date of Birth/Sex: Treating RN: Sep 13, 1960 (62 y.o. [name] Primary Care Provider: Gladstone Lighter Other Clinician: Referring Provider: Treating Provider/Extender: Milda Smart Weeks in Treatment: 2 Subjective Chief Complaint Information obtained from Patient 05/06/2022; Bilateral feet wounds and  sacral wounds History of Present Illness (HPI) 05/06/2022 Ms. Spencer Schifano is a 62 year old female with a past medical history of multiple sclerosis and PEs and DVT that presents to the clinic for sacral s wounds and Bilateral feet wounds. Patient states that she obtained the sacral ulcers when she was hospitalized in November 2023 for bilateral pulmonary embolisms. She was in the ICU on a ventilator. Since discharge she has been using Dakin's wet-to-dry dressings to the area. Since her hospitalization she has been bedbound. However she is doing physical therapy. Prior to being hospitalized she was able to pull herself up and transfer from bed to chair on her own. Due to her MS she is not fully mobile. Unfortunately she states that the wound has gotten larger on her sacrum despite Wound care. Patient has home health. On 04/16/2022 she was admitted to the hospital for severe sepsis secondary to acute UTI and sacral cellulitis. She completed 5 days of IV cefepime and vancomycin. She was not discharged with oral antibiotics. She states she developed the bilateral feet wounds at that time. She states she has bunny boots however she is not wearing them. She currently denies systemic signs of infection. 2/7; patient presents for follow-up. She had a bone biopsy done at last clinic visit that showed fragments of inflamed granulation tissue, necrotic material and bone with acute osteomyelitis. Culture grew Proteus mirabilis and Klebsiella pneumoniae. Patient is scheduled to see infectious disease next week. For now she has been doing Dakin's wet-to-dry dressings to the sacrum. She is not able to obtain Santyl and has been keeping the left heel covered. T the right foot where o there was a previous wound with scab however this has healed. This has remained closed. No open wound noted. She states over the past week she has had chills but no fevers, nausea/vomiting or purulent  drainage. Objective Constitutional Vitals Time Taken: 12:40 PM, Temperature: 98.2 F, Pulse: 112 bpm, Respiratory Rate: 18 breaths/min, Blood Pressure: 101/66 mmHg. General Notes: Left heel: Eschar Right foot: T the anterior aspect there is no open wound. Sacrum: Extensive large wound that tunnels and undermines with o nonviable tissue, granulation tissue and exposed bone. Integumentary (Hair, Skin) Wound #1 status is Healed - Epithelialized. Original cause of wound was Pressure Injury. The date acquired was: 03/12/2022. The wound has been in treatment 2 weeks. The wound is located on the Right,Medial Foot. The wound measures 0cm length x 0cm width x 0cm depth; 0cm^2 area and 0cm^3 volume. There is no tunneling or undermining noted. There is a none present amount of drainage noted. There is no granulation within the wound bed. There is no necrotic tissue within the wound bed. Wound #2 status is Open. Original cause of wound was Pressure Injury. The date acquired was: 03/12/2022. The wound has been in treatment 2 weeks. The wound is located  on the Left Calcaneus. The wound measures 3cm length x 2cm width x 0.1cm depth; 4.712cm^2 area and 0.471cm^3 volume. There is no tunneling or undermining noted. There is a none present amount of drainage noted. There is no granulation within the wound bed. There is a large (67-100%) amount of necrotic tissue within the wound bed including Eschar. Wound #3 status is Open. Original cause of wound was Pressure Injury. The date acquired was: 03/09/2022. The wound has been in treatment 2 weeks. The wound is located on the Proximal,Midline Sacrum. The wound measures 7.5cm length x 4cm width x 5cm depth; 23.562cm^2 area and 117.81cm^3 volume. There is bone, muscle, and Fat Layer (Subcutaneous Tissue) exposed. There is no tunneling noted, however, there is undermining starting at 12:00 and ending at 12:00 with a maximum distance of 11cm. There is a medium amount of  serosanguineous drainage noted. There is small (1-33%) red, hyper - granulation within the wound bed. There is a large (67-100%) amount of necrotic tissue within the wound bed including Adherent Slough. Wound #4 status is Open. Original cause of wound was Pressure Injury. The date acquired was: 03/12/2022. The wound has been in treatment 2 weeks. The wound is located on the [place]. The wound measures 2cm length x 1.5cm width x 0.3cm depth; 2.356cm^2 area and 0.707cm^3 volume. There is Fat Layer (Subcutaneous Tissue) exposed. There is no tunneling or undermining noted. There is a medium amount of serosanguineous drainage noted. There is small (1-33%) red granulation within the wound bed. There is a large (67-100%) amount of necrotic tissue within the wound bed including Adherent Slough. [name], [name] ([id number]) [id number] Page 7 of 9 Assessment Active Problems ICD-10 Pressure ulcer of sacral region, stage 4 Pressure ulcer of sacral region, stage 3 Pressure ulcer of right heel, unstageable Pressure ulcer of left heel, unstageable Multiple sclerosis Other pulmonary embolism without acute cor pulmonale Acute embolism and thrombosis of unspecified deep veins of unspecified lower extremity Long term (current) use of anticoagulants Osteomyelitis of vertebra, sacral and sacrococcygeal region Based on biopsy results and culture patient has osteomyelitis of the sacrum. We sent a referral to infectious disease and she has been scheduled to be seen on 2/13. Since she is reporting chills I recommended starting oral antibiotics. Cefazolin was sensitive to the culture results. Will give her Keflex. For now I recommended Dakin's wet-to-dry dressings to the sacrum and aggressive offloading. Unfortunately she never obtained the [facility] for the right heel wound. I debrided nonviable tissue here and recommended Hydrofera Blue with Medihoney. Continue with bunny  boots. Procedures Wound #2 Pre-procedure diagnosis of Wound #2 is a Pressure Ulcer located on the Left Calcaneus . There was a Selective/Open Wound Skin/Epidermis Debridement with a total area of 6 sq cm performed by Kalman Shan, MD. With the following instrument(s): Curette to remove Viable and Non-Viable tissue/material. Material removed includes Eschar, Skin: Dermis, and Skin: Epidermis. No specimens were taken. A time out was conducted at 13:15, prior to the start of the procedure. A Minimum amount of bleeding was controlled with Pressure. The procedure was tolerated well with a pain level of 0 throughout and a pain level of 0 following the procedure. Post Debridement Measurements: 3cm length x 2cm width x 0.1cm depth; 0.471cm^3 volume. Post debridement Stage noted as Unstageable/Unclassified. Character of Wound/Ulcer Post Debridement is improved. Post procedure Diagnosis Wound #2: Same as Pre-Procedure Plan Follow-up Appointments: Return Appointment in 2 weeks. Home Health: [facility] for wound care. May  utilize formulary equivalent dressing for wound treatment orders unless otherwise specified. Home Health Nurse may visit PRN to address patientoos wound care needs. Scheduled days for dressing changes to be completed; exception, patient has scheduled wound care visit that day. **Please direct any NON-WOUND related issues/requests for orders to patient's Primary Care Physician. **If current dressing causes regression in wound condition, may D/C ordered dressing product/s and apply Normal Saline Moist Dressing daily until next [place] or Other MD appointment. **Notify [facility] of regression in wound condition at [phone]. Bathing/ Shower/ Hygiene: May shower; gently cleanse wound with antibacterial soap, rinse and pat dry prior to dressing wounds No tub bath. Anesthetic (Use 'Patient Medications' Section for  Anesthetic Order Entry): Lidocaine applied to wound bed Off-Loading: Roho cushion for wheelchair Hospital bed/mattress Air fluidized (Group 3) Turn and reposition every 2 hours Other: - Wear offloading boots at all times, no pressure on heels. Float heels on pillow. Additional Orders / Instructions: Follow Nutritious Diet and Increase Protein Intake - Increase protein, supplement with Ensure, Boost, etc. WOUND #2: - Calcaneus Wound Laterality: Left Cleanser: Soap and Water (Home Health) 3 x Per Week/30 Days Discharge Instructions: Gently cleanse wound with antibacterial soap, rinse and pat dry prior to dressing wounds Cleanser: Wound Cleanser ([place]) 3 x Per Week/30 Days Discharge Instructions: Wash your hands with soap and water. Remove old dressing, discard into plastic bag and place into trash. Cleanse the wound with Wound Cleanser prior to applying a clean dressing using gauze sponges, not tissues or cotton balls. Do not scrub or use excessive force. Pat dry using gauze sponges, not tissue or cotton balls. Topical: Activon [name] Gel, 25 (g) Tube 3 x Per Week/30 Days Prim Dressing: Hydrofera Blue Ready Transfer Foam, 2.5x2.5 (in/in) 3 x Per Week/30 Days ary Discharge Instructions: Apply Hydrofera Blue Ready to wound bed as directed Secondary Dressing: Kerlix 4.5 x 4.1 (in/yd) (Home Health) 3 x Per Week/30 Days Discharge Instructions: Apply Kerlix 4.5 x 4.1 (in/yd) as instructed Secured With: Medipore T - 36M Medipore H Soft Cloth Surgical T ape ape, 2x2 (in/yd) ([place]) 3 x Per Week/30 Days Add-Ons: ALLEVYN Heel 4 1/2in x 5 1/2in / 10.5cm x 13.5cm (Home Health) 3 x Per Week/30 Days Discharge Instructions: Apply the white, patterned surface to the heel. WOUND #3: - Sacrum Wound Laterality: Midline, Proximal Cleanser: Soap and Water (Home Health) 1 x Per Day/30 Days Discharge Instructions: Gently cleanse wound with antibacterial soap, rinse and pat dry prior to dressing  wounds [name], [name] ([id number]) [id number] Page 8 of 9 Cleanser: Wound Cleanser (Home Health) 1 x Per Day/30 Days Discharge Instructions: Wash your hands with soap and water. Remove old dressing, discard into plastic bag and place into trash. Cleanse the wound with Wound Cleanser prior to applying a clean dressing using gauze sponges, not tissues or cotton balls. Do not scrub or use excessive force. Pat dry using gauze sponges, not tissue or cotton balls. Secondary Dressing: Conforming Guaze Roll-Medium [facility]) (Generic) 1 x Per Day/30 Days Discharge Instructions: Pack lightly with rolled gauze soaked in Dakins solution. Secured With: Bordered foam dressing. (Home Health) (Generic) 1 x Per Day/30 Days WOUND #4: - Sacrum Wound Laterality: Midline, Distal Cleanser: Soap and Water (Home Health) 1 x Per Day/30 Days Discharge Instructions: Gently cleanse wound with antibacterial soap, rinse and pat dry prior to dressing wounds Cleanser: Wound Cleanser ([place]) 1 x Per Day/30 Days Discharge Instructions: Wash your hands with soap and water. Remove  old dressing, discard into plastic bag and place into trash. Cleanse the wound with Wound Cleanser prior to applying a clean dressing using gauze sponges, not tissues or cotton balls. Do not scrub or use excessive force. Pat dry using gauze sponges, not tissue or cotton balls. Secondary Dressing: Conforming Guaze Roll-Medium [facility]) (Generic) 1 x Per Day/30 Days Discharge Instructions: Pack lightly with rolled gauze soaked in Dakins solution. Secured With: Bordered foam dressing. (Home Health) (Generic) 1 x Per Day/30 Days Discharge Instructions: cover to secure 1. In office sharp debridement 2. Hydrofera Blue and Medihoney 3. Dakin's wet-to-dry dressings 4. Keflex 5. Follow-up in 2 weeks 6. Follow-up with infectious disease Electronic Signature(s) Signed: 05/20/2022 2:40:27 PM By: Kalman Shan  DO Entered By: Kalman Shan on 05/20/2022 13:42:38 -------------------------------------------------------------------------------- SuperBill Details Patient Name: Date of Service: [name] ND, [name] 05/20/2022 Medical Record Number: [id number] Patient Account Number: [id number] Date of Birth/Sex: Treating RN: [birth year] (62 y.o. [name] Primary Care Provider: Gladstone Lighter Other Clinician: Referring Provider: Treating Provider/Extender: Milda Smart Weeks in Treatment: 2 Diagnosis Coding ICD-10 Codes Code Description L89.154 Pressure ulcer of sacral region, stage 4 L89.153 Pressure ulcer of sacral region, stage 3 L89.610 Pressure ulcer of right heel, unstageable L89.620 Pressure ulcer of left heel, unstageable G35 Multiple sclerosis I26.99 Other pulmonary embolism without acute cor pulmonale I82.409 Acute embolism and thrombosis of unspecified deep veins of unspecified lower extremity Z79.01 Long term (current) use of anticoagulants 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, sacral and sacrococcygeal region Facility Procedures : Sharol Given Code: [id number] AND, Ori [id number] Description: [phone] - DEBRIDE WOUND 1ST 20 SQ CM OR < ICD-10 Diagnosis Description L89.620 Pressure ulcer of left heel, unstageable 33) [id number] Modifier: [id number] Quantity: 1 .pdf Page 9 of 9 Physician Procedures : CPT4 Code Description Modifier [id number] - WC PHYS LEVEL 4 - EST PT ICD-10 Diagnosis Description L89.153 Pressure ulcer of sacral region, stage 3 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

## 2022-05-26 ENCOUNTER — Ambulatory Visit: Payer: Medicare Other | Admitting: Infectious Diseases

## 2022-05-28 ENCOUNTER — Encounter: Payer: Self-pay | Admitting: Infectious Diseases

## 2022-05-28 ENCOUNTER — Other Ambulatory Visit
Admission: RE | Admit: 2022-05-28 | Discharge: 2022-05-28 | Disposition: A | Discharge status: Home / Self Care | Payer: Medicare Other | Source: Ambulatory Visit | Attending: Infectious Diseases | Admitting: Infectious Diseases

## 2022-05-28 ENCOUNTER — Ambulatory Visit: Payer: Medicare Other | Attending: Infectious Diseases | Admitting: Infectious Diseases

## 2022-05-28 VITALS — BP 108/62 | HR 102 | Temp 97.7°F

## 2022-05-28 DIAGNOSIS — G35 Multiple sclerosis: Secondary | ICD-10-CM | POA: Insufficient documentation

## 2022-05-28 DIAGNOSIS — B9689 Other specified bacterial agents as the cause of diseases classified elsewhere: Secondary | ICD-10-CM | POA: Diagnosis not present

## 2022-05-28 DIAGNOSIS — Z8616 Personal history of COVID-19: Secondary | ICD-10-CM | POA: Diagnosis not present

## 2022-05-28 DIAGNOSIS — L89154 Pressure ulcer of sacral region, stage 4: Secondary | ICD-10-CM | POA: Diagnosis not present

## 2022-05-28 DIAGNOSIS — Z79899 Other long term (current) drug therapy: Secondary | ICD-10-CM | POA: Insufficient documentation

## 2022-05-28 DIAGNOSIS — M8610 Other acute osteomyelitis, unspecified site: Secondary | ICD-10-CM | POA: Diagnosis present

## 2022-05-28 DIAGNOSIS — I10 Essential (primary) hypertension: Secondary | ICD-10-CM | POA: Insufficient documentation

## 2022-05-28 DIAGNOSIS — Z86711 Personal history of pulmonary embolism: Secondary | ICD-10-CM | POA: Diagnosis not present

## 2022-05-28 LAB — CBC WITH DIFFERENTIAL/PLATELET
Abs Immature Granulocytes: 1.69 10*3/uL — ABNORMAL HIGH (ref 0.00–0.07)
Basophils Absolute: 0 10*3/uL (ref 0.0–0.1)
Basophils Relative: 0 %
Eosinophils Absolute: 0 10*3/uL (ref 0.0–0.5)
Eosinophils Relative: 0 %
HCT: 21.8 % — ABNORMAL LOW (ref 36.0–46.0)
Hemoglobin: 6.7 g/dL — ABNORMAL LOW (ref 12.0–15.0)
Immature Granulocytes: 10 %
Lymphocytes Relative: 3 %
Lymphs Abs: 0.6 10*3/uL — ABNORMAL LOW (ref 0.7–4.0)
MCH: 24.2 pg — ABNORMAL LOW (ref 26.0–34.0)
MCHC: 30.7 g/dL (ref 30.0–36.0)
MCV: 78.7 fL — ABNORMAL LOW (ref 80.0–100.0)
Monocytes Absolute: 1.2 10*3/uL — ABNORMAL HIGH (ref 0.1–1.0)
Monocytes Relative: 7 %
Neutro Abs: 12.9 10*3/uL — ABNORMAL HIGH (ref 1.7–7.7)
Neutrophils Relative %: 80 %
Platelets: 151 10*3/uL (ref 150–400)
RBC: 2.77 MIL/uL — ABNORMAL LOW (ref 3.87–5.11)
RDW: 19.9 % — ABNORMAL HIGH (ref 11.5–15.5)
Smear Review: NORMAL
WBC: 16.4 10*3/uL — ABNORMAL HIGH (ref 4.0–10.5)
nRBC: 0 % (ref 0.0–0.2)

## 2022-05-28 LAB — COMPREHENSIVE METABOLIC PANEL
ALT: 17 U/L (ref 0–44)
AST: 32 U/L (ref 15–41)
Albumin: 2.2 g/dL — ABNORMAL LOW (ref 3.5–5.0)
Alkaline Phosphatase: 298 U/L — ABNORMAL HIGH (ref 38–126)
Anion gap: 12 (ref 5–15)
BUN: 11 mg/dL (ref 6–20)
CO2: 23 mmol/L (ref 22–32)
Calcium: 8.5 mg/dL — ABNORMAL LOW (ref 8.9–10.3)
Chloride: 95 mmol/L — ABNORMAL LOW (ref 98–111)
Creatinine, Ser: 0.46 mg/dL (ref 0.44–1.00)
GFR, Estimated: >60 mL/min (ref >60)
Glucose, Bld: 105 mg/dL — ABNORMAL HIGH (ref 70–99)
Potassium: 3.5 mmol/L (ref 3.5–5.1)
Sodium: 130 mmol/L — ABNORMAL LOW (ref 135–145)
Total Bilirubin: 0.5 mg/dL (ref 0.3–1.2)
Total Protein: 5.8 g/dL — ABNORMAL LOW (ref 6.5–8.1)

## 2022-05-28 LAB — SEDIMENTATION RATE: Sed Rate: 126 mm/hr — ABNORMAL HIGH (ref 0–30)

## 2022-05-28 MED ORDER — LEVOFLOXACIN 750 MG PO TABS: 750.0000 mg | ORAL_TABLET | Freq: Every day | ORAL | 1 refills | Status: DC

## 2022-05-28 NOTE — Progress Notes (Signed)
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 [name] is a 62 y.o. female with a history of multiple sclerosis on Dalfampridine, HTN, RLS, depresison Who had a prolonged hospitalizaion [place] 02/24/22-03/20/22 for acute PE  with moderate bioburden involving both lungs.She had covid before her presentation to the hospital ., She underwent thrombectomy on 02/25/22, developed acute hemoptysis requiring emergent intubation , emergent bronch to remove clots, blood transfusion. Complicated course with unresponsiveness persistent leucocytosis and finally extubated on 03/15/22 and sent to [facility] on 03/20/22 where she stayed for 2 weeks-  Some where along the hospital stays she developed sacral decubitus and she was back in [facility] for a short period 2nd week of Jan when the lesion was noted    She was followed at wound clinic and underwent a sacral bone biopsy on 05/06/22 and it was acute osteo- culture was proteus and klebsiella- Partner is doing dressing changes along with home health nurse Pt is here for management of infection Pt is bed bound currently Previously wheel chair bound Says body is locking 2-3 times /day Past Medical History:  Diagnosis Date   Bilateral pulmonary embolism (HCC)    Chronic constipation    Family history of pancreatic cancer    Frequent falls    GERD (gastroesophageal reflux disease)    Hiatal hernia    Multiple sclerosis ([place])    Pulmonary hemorrhage    RLS (restless legs syndrome)    Sepsis due to pneumonia ([place])    Tachycardia     Past Surgical History:  Procedure Laterality Date   ESOPHAGOGASTRODUODENOSCOPY (EGD) WITH PROPOFOL N/A 09/11/2021   Procedure: ESOPHAGOGASTRODUODENOSCOPY (EGD) WITH PROPOFOL;  Surgeon: Annamaria Helling, DO;  Location: [facility] ENDOSCOPY;  Service:  Gastroenterology;  Laterality: N/A;   PULMONARY THROMBECTOMY Bilateral 02/25/2022   Procedure: PULMONARY THROMBECTOMY;  Surgeon: Algernon Huxley, MD;  Location: [facility];  Service: Cardiovascular;  Laterality: Bilateral;   TUBAL LIGATION      Social History   Socioeconomic History   Marital status: Widowed    Spouse name: Not on file   Number of children: Not on file   Years of education: Not on file   Highest education level: Not on file  Occupational History   Not on file  Tobacco Use   Smoking status: Never   Smokeless tobacco: Never  Vaping Use   Vaping Use: Never used  Substance and Sexual Activity   Alcohol use: Never    Comment: Social   Drug use: Never   Sexual activity: Not Currently  Other Topics Concern   Not on file  Social History Narrative   Not on file   Social Determinants of Health   Financial Resource Strain: Not on file  Food Insecurity: No Food Insecurity (04/17/2022)   Hunger Vital Sign    Worried About Running Out of Food in the Last Year: Never true    Ran Out of Food in the Last Year: Never true  Transportation Needs: No Transportation Needs (04/17/2022)   PRAPARE - Hydrologist (Medical): No    Lack of Transportation (Non-Medical): No  Physical Activity: Not on file  Stress: Not on file  Social Connections: Not on file  Intimate Partner Violence: Not At Risk (04/17/2022)   Humiliation, Afraid, Rape, and Kick questionnaire    Fear of Current or  Ex-Partner: No    Emotionally Abused: No    Physically Abused: No    Sexually Abused: No    No family history on file. Allergies  Allergen Reactions   Erythromycin Hives and Nausea And Vomiting   Lexapro [Escitalopram Oxalate] Hives   I? Current Outpatient Medications  Medication Sig Dispense Refill   ascorbic acid (VITAMIN C) 500 MG tablet Take 500 mg by mouth daily.     Cholecalciferol 50 MCG (2000 UT) CAPS Take 2,000 Units by mouth daily.     cyanocobalamin  1000 MCG tablet Place 1 tablet (1,000 mcg total) into feeding tube daily.     cyclobenzaprine (FLEXERIL) 10 MG tablet Take 10 mg by mouth 2 (two) times daily as needed.     dalfampridine 10 MG TB12 Take 1 tablet by mouth every 12 (twelve) hours.     DULoxetine (CYMBALTA) 30 MG capsule Take 30 mg by mouth daily.     gabapentin (NEURONTIN) 800 MG tablet Take 400-800 mg by mouth 2 (two) times daily.     imipramine (TOFRANIL) 25 MG tablet Take 25 mg by mouth at bedtime.     midodrine (PROAMATINE) 5 MG tablet Take 1 tablet (5 mg total) by mouth 2 (two) times daily with a meal. 14 tablet 0   nutrition supplement, JUVEN, (JUVEN) PACK Place 1 packet into feeding tube 2 (two) times daily between meals.  0   Nutritional Supplements (FEEDING SUPPLEMENT, OSMOLITE 1.5 CAL,) LIQD Place 1,000 mLs into feeding tube continuous.  0   omeprazole (PRILOSEC) 20 MG capsule Take 20 mg by mouth daily.     pregabalin (LYRICA) 75 MG capsule Take 75 mg by mouth 3 (three) times daily.     propranolol (INDERAL) 20 MG tablet Take 20 mg by mouth 2 (two) times daily.     Protein (FEEDING SUPPLEMENT, PROSOURCE TF20,) liquid Place 60 mLs into feeding tube daily.     rivaroxaban (XARELTO) 20 MG TABS tablet Take 1 tablet (20 mg total) by mouth daily with supper. 30 tablet 0   rOPINIRole (REQUIP) 0.5 MG tablet Take 0.5 mg by mouth at bedtime.     traMADol-acetaminophen (ULTRACET) 37.5-325 MG tablet Take 2 tablets by mouth 2 (two) times daily.     traZODone (DESYREL) 50 MG tablet Take 50 mg by mouth at bedtime.     No current facility-administered medications for this visit.     Abtx:  Anti-infectives (From admission, onward)    None       REVIEW OF SYSTEMS:  Const:  fever of 101-104- lasts 20 min,  chills,  weight loss Eyes: negative diplopia or visual changes, negative eye pain ENT: negative coryza, negative sore throat Resp: negative cough, hemoptysis, dyspnea Cards: negative for chest pain, palpitations, lower  extremity edema GU: negative for frequency, dysuria and hematuria GI: Negative for abdominal pain, diarrhea, bleeding, constipation Skin: negative for rash and pruritus Heme: negative for easy bruising and gum/nose bleeding MS: weakness- as above Neurolo:negative for headaches, dizziness, vertigo, memory problems  Psych:  anxiety, depression  Endocrine: negative for thyroid, diabetes Allergy/Immunology- erythromycin and lexapro Objective:  VITALS:  There were no vitals taken for this visit. Pt brought in by EMS and is examined in the stretcher so limited examination PHYSICAL EXAM:  General: Alert, cooperative,pale, appears stated age.  Head: Normocephalic, without obvious abnormality, atraumatic. Eyes: Conjunctivae clear, anicteric sclerae. Pupils are equal ENT Nares normal. No drainage or sinus tenderness. Lips, mucosa, and tongue normal. No Thrush Neck: Supple, symmetrical, no  adenopathy, thyroid: non tender no carotid bruit and no JVD. Back: No CVA tenderness. Lungs: Clear to auscultation bilaterally. No Wheezing or Rhonchi. No rales. Heart: Regular rate and rhythm, no murmur, rub or gallop. Abdomen: Soft, non-tender,not distended. Bowel sounds normal. No masses Sacrum- stage IV decubitus clean Extremities: wasting Skin: No rashes or lesions. Or bruising Lymph: Cervical, supraclavicular normal. Neurologic: not examined in detail Pertinent Labs Lab Results CBC    Component Value Date/Time   WBC 11.7 (H) 04/21/2022 0618   RBC 2.89 (L) 04/21/2022 0618   HGB 7.6 (L) 04/21/2022 0618   HCT 25.4 (L) 04/21/2022 0618   PLT 283 04/21/2022 0618   MCV 87.9 04/21/2022 0618   MCH 26.3 04/21/2022 0618   MCHC 29.9 (L) 04/21/2022 0618   RDW 18.3 (H) 04/21/2022 0618   LYMPHSABS 0.5 (L) 04/21/2022 0618   MONOABS 1.4 (H) 04/21/2022 0618   EOSABS 0.1 04/21/2022 0618   BASOSABS 0.1 04/21/2022 0618       Latest Ref Rng & Units 04/21/2022    6:18 AM 04/20/2022    5:07 AM 04/19/2022     6:27 AM  CMP  Glucose 70 - 99 mg/dL  99  95   BUN 6 - 20 mg/dL  8  7   Creatinine 0.44 - 1.00 mg/dL 0.43  0.46  0.46   Sodium 135 - 145 mmol/L  137  134   Potassium 3.5 - 5.1 mmol/L 4.4  3.4  3.6   Chloride 98 - 111 mmol/L  104  102   CO2 22 - 32 mmol/L  24  22   Calcium 8.9 - 10.3 mg/dL  8.2  8.0    ? 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  Anemia  ?__________________________________________________ Discussed with patient, and partner- My chart video visit in 4 weeks Note:  This document was prepared using Dragon voice recognition software and may include unintentional dictation errors.

## 2022-05-28 NOTE — Patient Instructions (Signed)
You are here for sacral decubitus wound with costeomyleitis of the bone- will start on antibiotic levaquin 776m once a day- may need for 6-8 weeks- Drink plenty of water- take a probioitc- if you notice excess ligament or tendon pain from the current baselineplease let me know Will assess in 1 month.

## 2022-05-29 ENCOUNTER — Encounter: Payer: Self-pay | Admitting: Infectious Diseases

## 2022-06-02 LAB — CULTURE, BLOOD (ROUTINE X 2)
Culture: NO GROWTH
Culture: NO GROWTH
Special Requests: ADEQUATE

## 2022-06-03 ENCOUNTER — Encounter (HOSPITAL_BASED_OUTPATIENT_CLINIC_OR_DEPARTMENT_OTHER): Payer: Medicare Other | Admitting: Internal Medicine

## 2022-06-03 DIAGNOSIS — M4628 Osteomyelitis of vertebra, sacral and sacrococcygeal region: Secondary | ICD-10-CM

## 2022-06-03 DIAGNOSIS — L89154 Pressure ulcer of sacral region, stage 4: Secondary | ICD-10-CM

## 2022-06-03 DIAGNOSIS — L89610 Pressure ulcer of right heel, unstageable: Secondary | ICD-10-CM

## 2022-06-04 ENCOUNTER — Telehealth: Payer: Self-pay

## 2022-06-04 NOTE — Progress Notes (Addendum)
SHONNA, NANCE ([id number]) [id number] Page 1 of 9 Visit Report for 06/03/2022 Chief Complaint Document Details Patient Name: Date of Service: [name] STRA ND, [name] 06/03/2022 3:00 PM Medical Record Number: [id number] Patient Account Number: [id number] Date of Birth/Sex: Treating RN: [birth year] (62 y.o. [name] Primary Care Provider: Gladstone [name] Other Clinician: Referring Provider: Treating Provider/Extender: [name] Weeks in Treatment: 4 Information Obtained from: Patient Chief Complaint 05/06/2022; Bilateral feet wounds and sacral wounds Electronic Signature(s) Signed: 06/03/2022 4:39:06 PM By: [name] Shan DO Entered By: [name] on 06/03/2022 15:59:06 -------------------------------------------------------------------------------- Debridement Details Patient Name: Date of Service: [name] ND, [name] 06/03/2022 3:00 PM Medical Record Number: [id number] Patient Account Number: [id number] Date of Birth/Sex: Treating RN: [birth year] (62 y.o. [name] Primary Care Provider: Gladstone [name] Other Clinician: Referring Provider: Treating Provider/Extender: [name] Weeks in Treatment: 4 Debridement Performed for Assessment: Wound #2 Left Calcaneus Performed By: Physician [name] Shan, MD Debridement Type: Debridement Level of Consciousness (Pre-procedure): Awake and Alert Pre-procedure Verification/Time Out Yes - 15:15 Taken: Start Time: 15:15 T Area Debrided (L x W): otal 3 (cm) x 1.5 (cm) = 4.5 (cm) Tissue and other material debrided: Non-Viable, Eschar Level: Non-Viable Tissue Debridement Description: Selective/Open Wound Instrument: Curette Bleeding: None End Time: 15:21 Procedural Pain: 0 Post Procedural Pain: 0 Response to Treatment: Procedure was tolerated well Level of Consciousness (Post- Awake and  Alert procedure): [name] ([id number]) [phone].pdf Page 2 of 9 Post Debridement Measurements of Total Wound Length: (cm) 3 Stage: Unstageable/Unclassified Width: (cm) 1.5 Depth: (cm) 0.1 Volume: (cm) 0.353 Character of Wound/Ulcer Post Debridement: Improved Post Procedure Diagnosis Same as Pre-procedure Electronic Signature(s) Signed: 06/03/2022 4:35:53 PM By: [name] Coria RN Signed: 06/03/2022 4:39:06 PM By: [name] Shan DO Entered By: [name] on 06/03/2022 16:35:53 -------------------------------------------------------------------------------- HPI Details Patient Name: Date of Service: [name] ND, [name] 06/03/2022 3:00 PM Medical Record Number: [id number] Patient Account Number: [id number] Date of Birth/Sex: Treating RN: 10/08/1960 (62 y.o. [name] Primary Care Provider: Gladstone [name] Other Clinician: Referring Provider: Treating Provider/Extender: [name] Weeks in Treatment: 4 History of Present Illness HPI Description: 05/06/2022 [name] is a 62 year old female with a past medical history of multiple sclerosis and PEs and DVT that presents to the clinic for sacral s wounds and Bilateral feet wounds. Patient states that she obtained the sacral ulcers when she was hospitalized in November 2023 for bilateral pulmonary embolisms. She was in the ICU on a ventilator. Since discharge she has been using Dakin's wet-to-dry dressings to the area. Since her hospitalization she has been bedbound. However she is doing physical therapy. Prior to being hospitalized she was able to pull herself up and transfer from bed to chair on her own. Due to her MS she is not fully mobile. Unfortunately she states that the wound has gotten larger on her sacrum despite Wound care. Patient has home health. On 04/16/2022 she was admitted to the hospital for severe sepsis secondary to acute UTI  and sacral cellulitis. She completed 5 days of IV cefepime and vancomycin. She was not discharged with oral antibiotics. She states she developed the bilateral feet wounds at that time. She states she has bunny boots however she is not wearing them. She currently denies systemic signs of infection. 2/7; patient presents for follow-up. She had a bone biopsy done at last clinic visit that showed fragments of inflamed granulation tissue, necrotic material and bone with  acute osteomyelitis. Culture grew Proteus mirabilis and Klebsiella pneumoniae. Patient is scheduled to see infectious disease next week. For now she has been doing Dakin's wet-to-dry dressings to the sacrum. She is not able to obtain Santyl and has been keeping the left heel covered. T the right foot where o there was a previous wound with scab however this has healed. This has remained closed. No open wound noted. She states over the past week she has had chills but no fevers, nausea/vomiting or purulent drainage. 2/21; patient presents for follow-up. She saw Dr. Steva [name] on 2/15. 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 Today she had feces impacted in the wound and throughout the tunneling. We discussed a diverting colostomy to address this issue. She was agreeable with a referral to general surgery. She currently denies systemic signs of infection. She has been using Medihoney and Hydrofera Blue to the left heel wound. She has been using her Prevalon boots. Electronic Signature(s) Signed: 06/03/2022 4:39:06 PM By: [name] Shan DO Entered By: [name] on 06/03/2022 16:01:00 [name] ([id number]) [id number] Page 3 of 9 -------------------------------------------------------------------------------- Physical Exam Details Patient Name: Date of Service: [name] STRA ND, [name] 06/03/2022 3:00 PM Medical Record Number: [id number] Patient Account Number:  [id number] Date of Birth/Sex: Treating RN: [birth year] (62 y.o. [name] Primary Care Provider: Gladstone [name] Other Clinician: Referring Provider: Treating Provider/Extender: [name] Weeks in Treatment: 4 Constitutional . Cardiovascular . Psychiatric . Notes Left heel: Eschar Right foot: T the anterior aspect there is no open wound. Sacrum: Extensive large wound that tunnels and undermines with nonviable tissue, o granulation tissue and exposed bone. On intake feces throughout the entire wound bed. Electronic Signature(s) Signed: 06/03/2022 4:39:06 PM By: [name] Shan DO Entered By: [name] on 06/03/2022 16:01:30 -------------------------------------------------------------------------------- Physician Orders Details Patient Name: Date of Service: [name] ND, [name] 06/03/2022 3:00 PM Medical Record Number: [id number] Patient Account Number: [id number] Date of Birth/Sex: Treating RN: 01/02/1961 (62 y.o. [name] Primary Care Provider: Gladstone [name] Other Clinician: Referring Provider: Treating Provider/Extender: [name] Weeks in Treatment: 4 Verbal / Phone Orders: No Diagnosis Coding ICD-10 Coding Code Description L89.154 Pressure ulcer of sacral region, stage 4 L89.153 Pressure ulcer of sacral region, stage 3 L89.610 Pressure ulcer of right heel, unstageable L89.620 Pressure ulcer of left heel, unstageable G35 Multiple sclerosis I26.99 Other pulmonary embolism without acute cor pulmonale I82.409 Acute embolism and thrombosis of unspecified deep veins of unspecified lower extremity [name], [name] ([id number]) [phone].pdf Page 4 of 9 Z79.01 Long term (current) use of anticoagulants 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, sacral and sacrococcygeal region Follow-up Appointments Return Appointment in [place]: -  [facility] [phone] [place] for wound care. May utilize formulary equivalent dressing for wound treatment orders unless otherwise specified. Home Health Nurse may visit PRN to address patients wound care needs. Scheduled days for dressing changes to be completed; exception, patient has scheduled wound care visit that day. **Please direct any NON-WOUND related issues/requests for orders to patient's Primary Care Physician. **If current dressing causes regression in wound condition, may D/C ordered dressing product/s and apply Normal Saline Moist Dressing daily until next [place] or Other MD appointment. **Notify [facility] of regression in wound condition at [phone]. Bathing/ Shower/ Hygiene May shower; gently cleanse wound with antibacterial soap, rinse and pat dry prior to dressing wounds No tub bath. Anesthetic (Use 'Patient Medications' Section for  Anesthetic Order Entry) Lidocaine applied to wound bed Off-Loading Roho cushion for wheelchair Hospital bed/mattress A fluidized (Group 3) ir Turn and reposition every 2 hours Other: - Wear offloading boots at all times, no pressure on heels. Float heels on pillow. Additional Orders / Instructions Follow Nutritious Diet and Increase Protein Intake - Increase protein, supplement with Ensure, Boost, etc. Wound Treatment Wound #2 - Calcaneus Wound Laterality: Left Cleanser: Soap and Water (Home Health) 1 x Per Day/30 Days Discharge Instructions: Gently cleanse wound with antibacterial soap, rinse and pat dry prior to dressing wounds Cleanser: Wound Cleanser ([place]) 1 x Per Day/30 Days Discharge Instructions: Wash your hands with soap and water. Remove old dressing, discard into plastic bag and place into trash. Cleanse the wound with Wound Cleanser prior to applying a clean dressing using gauze sponges, not tissues or cotton balls. Do not scrub or use excessive force. Pat dry using gauze  sponges, not tissue or cotton balls. Topical: Activon Honey Gel, 25 (g) Tube 1 x Per Day/30 Days Prim Dressing: Hydrofera Blue [name] Transfer Foam, 2.5x2.5 (in/in) 1 x Per Day/30 Days ary Discharge Instructions: Apply Hydrofera Blue [name] to wound bed as directed Secondary Dressing: Kerlix 4.5 x 4.1 (in/yd) ([place]) 1 x Per Day/30 Days Discharge Instructions: Apply Kerlix 4.5 x 4.1 (in/yd) as instructed Secured With: Medipore T - 7M Medipore H Soft Cloth Surgical T ape ape, 2x2 (in/yd) ([place]) 1 x Per Day/30 Days Add-Ons: ALLEVYN Heel 4 1/2in x 5 1/2in / 10.5cm x 13.5cm (Home Health) 1 x Per Day/30 Days Discharge Instructions: Apply the white, patterned surface to the heel. Wound #3 - Sacrum Wound Laterality: Midline, Proximal Cleanser: Soap and Water (Home Health) 1 x Per Day/30 Days Discharge Instructions: Gently cleanse wound with antibacterial soap, rinse and pat dry prior to dressing wounds Cleanser: Wound Cleanser ([place]) 1 x Per Day/30 Days Discharge Instructions: Wash your hands with soap and water. Remove old dressing, discard into plastic bag and place into trash. Cleanse the wound with Wound Cleanser prior to applying a clean dressing using gauze sponges, not tissues or cotton balls. Do not scrub or use excessive force. Pat dry using gauze sponges, not tissue or cotton balls. Secondary Dressing: Conforming Guaze Roll-Medium [facility]) (Generic) 1 x Per Day/30 Days Discharge Instructions: Pack lightly with rolled gauze soaked in Dakins solution. Secured With: Bordered foam dressing. (Home Health) (Generic) 1 x Per Day/30 Days Consults General Surgery - consult for diverting colostomy with Dr Lysle Pearl - (ICD10 [id number] - Pressure ulcer of sacral region, stage 4) Electronic Signature(s) Signed: 06/04/2022 1:35:30 PM By: [name] Shan DO Signed: 06/08/2022 4:28:51 PM By: [name] Coria RN Previous Signature: 06/04/2022 10:17:31 AM Version By: [name], [name] ([id number]) [id number] Page 5 of 9 Previous Signature: 06/04/2022 10:17:31 AM Version By: [name] Shan DO Previous Signature: 06/03/2022 4:40:29 PM Version By: [name] Shan DO Previous Signature: 06/03/2022 4:36:54 PM Version By: [name] Coria RN Previous Signature: 06/03/2022 4:39:06 PM Version By: [name] Shan DO Entered By: [name] on 06/04/2022 12:49:39 -------------------------------------------------------------------------------- Problem List Details Patient Name: Date of Service: [name] ND, [name] 06/03/2022 3:00 PM Medical Record Number: [id number] Patient Account Number: [id number] Date of Birth/Sex: Treating RN: 06-05-1960 (62 y.o. [name] Primary Care Provider: Gladstone [name] Other Clinician: Referring Provider: Treating Provider/Extender: [name] Weeks in Treatment: 4 Active Problems ICD-10 Encounter Code Description Active Date MDM Diagnosis L89.154 Pressure ulcer of sacral region, stage 4 05/06/2022  No Yes L89.153 Pressure ulcer of sacral region, stage 3 05/06/2022 No Yes L89.610 Pressure ulcer of right heel, unstageable 05/06/2022 No Yes L89.620 Pressure ulcer of left heel, unstageable 05/06/2022 No Yes G35 Multiple sclerosis 05/06/2022 No Yes I26.99 Other pulmonary embolism without acute cor pulmonale 05/06/2022 No Yes I82.409 Acute embolism and thrombosis of unspecified deep veins of unspecified 05/06/2022 No Yes lower extremity Z79.01 Long term (current) use of anticoagulants 05/06/2022 No Yes 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, sacral and sacrococcygeal region 05/20/2022 No Yes Inactive Problems Resolved Problems [name], [name] ([id number]) [phone].pdf Page 6 of 9 Electronic Signature(s) Signed: 06/03/2022 4:39:06 PM By: [name] Shan DO Entered By: [name] on 06/03/2022  15:58:58 -------------------------------------------------------------------------------- Progress Note Details Patient Name: Date of Service: [name] ND, [name] 06/03/2022 3:00 PM Medical Record Number: [id number] Patient Account Number: [id number] Date of Birth/Sex: Treating RN: July 23, 1960 (62 y.o. [name] Primary Care Provider: Gladstone [name] Other Clinician: Referring Provider: Treating Provider/Extender: [name] Weeks in Treatment: 4 Subjective Chief Complaint Information obtained from Patient 05/06/2022; Bilateral feet wounds and sacral wounds History of Present Illness (HPI) 05/06/2022 Ms. Jenelly Hubbartt is a 62 year old female with a past medical history of multiple sclerosis and PEs and DVT that presents to the clinic for sacral s wounds and Bilateral feet wounds. Patient states that she obtained the sacral ulcers when she was hospitalized in November 2023 for bilateral pulmonary embolisms. She was in the ICU on a ventilator. Since discharge she has been using Dakin's wet-to-dry dressings to the area. Since her hospitalization she has been bedbound. However she is doing physical therapy. Prior to being hospitalized she was able to pull herself up and transfer from bed to chair on her own. Due to her MS she is not fully mobile. Unfortunately she states that the wound has gotten larger on her sacrum despite Wound care. Patient has home health. On 04/16/2022 she was admitted to the hospital for severe sepsis secondary to acute UTI and sacral cellulitis. She completed 5 days of IV cefepime and vancomycin. She was not discharged with oral antibiotics. She states she developed the bilateral feet wounds at that time. She states she has bunny boots however she is not wearing them. She currently denies systemic signs of infection. 2/7; patient presents for follow-up. She had a bone biopsy done at last clinic visit that showed fragments of  inflamed granulation tissue, necrotic material and bone with acute osteomyelitis. Culture grew Proteus mirabilis and Klebsiella pneumoniae. Patient is scheduled to see infectious disease next week. For now she has been doing Dakin's wet-to-dry dressings to the sacrum. She is not able to obtain Santyl and has been keeping the left heel covered. T the right foot where o there was a previous wound with scab however this has healed. This has remained closed. No open wound noted. She states over the past week she has had chills but no fevers, nausea/vomiting or purulent drainage. 2/21; patient presents for follow-up. She saw Dr. Steva [name] on 2/15. 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 Today she had feces impacted in the wound and throughout the tunneling. We discussed a diverting colostomy to address this issue. She was agreeable with a referral to general surgery. She currently denies systemic signs of infection. She has been using Medihoney and Hydrofera Blue to the left heel wound. She has been using her Prevalon boots. Objective Constitutional Vitals Time Taken: 3:00 PM, Temperature: 98.3 F, Pulse: 118 bpm, Respiratory Rate: 16 breaths/min,  Blood Pressure: 97/61 mmHg. General Notes: Left heel: Eschar Right foot: T the anterior aspect there is no open wound. Sacrum: Extensive large wound that tunnels and undermines with o nonviable tissue, granulation tissue and exposed bone. On intake feces throughout the entire wound bed. Integumentary (Hair, Skin) Wound #2 status is Open. Original cause of wound was Pressure Injury. The date acquired was: 03/12/2022. The wound has been in treatment 4 weeks. The wound is located on the Left Calcaneus. The wound measures 3cm length x 1.5cm width x 0.1cm depth; 3.534cm^2 area and 0.353cm^3 volume. There is no tunneling or undermining noted. There is a none present amount of drainage noted. There is no granulation within the wound bed.  There is a large (67-100%) amount of necrotic tissue within the wound bed including Eschar. Wound #3 status is Open. Original cause of wound was Pressure Injury. The date acquired was: 03/09/2022. The wound has been in treatment 4 weeks. The wound is located on the Proximal,Midline Sacrum. The wound measures 10cm length x 5cm width x 5cm depth; 39.27cm^2 area and 196.35cm^3 volume. There is bone, muscle, and Fat Layer (Subcutaneous Tissue) exposed. There is no undermining noted, however, there is tunneling at 5:00 with a maximum distance of 12cm. There is a medium amount of serosanguineous drainage noted. There is small (1-33%) red, hyper - granulation within the wound bed. There is a large [place], Aggie Moats ([id number]) [id number] Page 7 of 9 (67-100%) amount of necrotic tissue within the wound bed including Adherent Slough. Wound #4 status is Converted. Original cause of wound was Pressure Injury. The date acquired was: 03/12/2022. The wound has been in treatment 4 weeks. The wound is located on the [place]. The wound measures 2cm length x 1.5cm width x 0.3cm depth; 2.356cm^2 area and 0.707cm^3 volume. There is Fat Layer (Subcutaneous Tissue) exposed. There is no tunneling or undermining noted. There is a medium amount of serosanguineous drainage noted. There is small (1-33%) red granulation within the wound bed. There is a large (67-100%) amount of necrotic tissue within the wound bed including Adherent Slough. Assessment Active Problems ICD-10 Pressure ulcer of sacral region, stage 4 Pressure ulcer of sacral region, stage 3 Pressure ulcer of right heel, unstageable Pressure ulcer of left heel, unstageable Multiple sclerosis Other pulmonary embolism without acute cor pulmonale Acute embolism and thrombosis of unspecified deep veins of unspecified lower extremity Long term (current) use of anticoagulants Osteomyelitis of vertebra, sacral and  sacrococcygeal region Patient's wound is stable. Bone is still exposed. Plan is for 6 to 8 weeks of oral Levaquin. Since she had feces throughout the entire wound bed I recommended a diverting colostomy. We will refer her to general surgery for this. The wound bed does not appear healthy and would not do well with a wound VAC at this time. She will likely need plastic surgery referral for debridement and muscle flap. For now I recommended Dakin's wet-to-dry dressings to the sacral wound. I debrided the eschar to the right heel wound. I recommended continue Medihoney and Hydrofera Blue here with aggressive offloading and a Prevalon boot. Follow-up in 4 weeks. Procedures Wound #2 Pre-procedure diagnosis of Wound #2 is a Pressure Ulcer located on the Left Calcaneus . There was a Selective/Open Wound Non-Viable Tissue Debridement with a total area of 4.5 sq cm performed by [name] Shan, MD. With the following instrument(s): Curette to remove Non-Viable tissue/material. Material removed includes Eschar. No specimens were taken. A time out was conducted at 15:15, prior to the start of  the procedure. There was no bleeding. The procedure was tolerated well with a pain level of 0 throughout and a pain level of 0 following the procedure. Post Debridement Measurements: 3cm length x 1.5cm width x 0.1cm depth; 0.353cm^3 volume. Post debridement Stage noted as Unstageable/Unclassified. Character of Wound/Ulcer Post Debridement is improved. Post procedure Diagnosis Wound #2: Same as Pre-Procedure Plan Follow-up Appointments: Return Appointment in 1 month Home Health: [place]: - [facility] [phone] [place] for wound care. May utilize formulary equivalent dressing for wound treatment orders unless otherwise specified. Home Health Nurse may visit PRN to address patientoos wound care needs. Scheduled days for dressing changes to be completed; exception, patient has scheduled  wound care visit that day. **Please direct any NON-WOUND related issues/requests for orders to patient's Primary Care Physician. **If current dressing causes regression in wound condition, may D/C ordered dressing product/s and apply Normal Saline Moist Dressing daily until next [place] or Other MD appointment. **Notify [facility] of regression in wound condition at [phone]. Bathing/ Shower/ Hygiene: May shower; gently cleanse wound with antibacterial soap, rinse and pat dry prior to dressing wounds No tub bath. Anesthetic (Use 'Patient Medications' Section for Anesthetic Order Entry): Lidocaine applied to wound bed Off-Loading: Roho cushion for wheelchair Hospital bed/mattress Air fluidized (Group 3) Turn and reposition every 2 hours Other: - Wear offloading boots at all times, no pressure on heels. Float heels on pillow. Additional Orders / Instructions: Follow Nutritious Diet and Increase Protein Intake - Increase protein, supplement with Ensure, Boost, etc. WOUND #2: - Calcaneus Wound Laterality: Left Cleanser: Soap and Water (Home Health) 1 x Per Day/30 Days Discharge Instructions: Gently cleanse wound with antibacterial soap, rinse and pat dry prior to dressing wounds Cleanser: Wound Cleanser ([place]) 1 x Per Day/30 Days Discharge Instructions: Wash your hands with soap and water. Remove old dressing, discard into plastic bag and place into trash. Cleanse the wound with Wound Cleanser prior to applying a clean dressing using gauze sponges, not tissues or cotton balls. Do not scrub or use excessive force. Pat dry using gauze sponges, not tissue or cotton balls. Topical: Activon Honey Gel, 25 (g) Tube 1 x Per Day/30 Days Prim Dressing: Hydrofera Blue [name] Transfer Foam, 2.5x2.5 (in/in) 1 x Per Day/30 Days [name] ([id number]) [id number] Page 8 of 9 Discharge Instructions: Apply Hydrofera Blue [name] to  wound bed as directed Secondary Dressing: Kerlix 4.5 x 4.1 (in/yd) ([place]) 1 x Per Day/30 Days Discharge Instructions: Apply Kerlix 4.5 x 4.1 (in/yd) as instructed Secured With: Medipore T - 59M Medipore H Soft Cloth Surgical T ape ape, 2x2 (in/yd) ([place]) 1 x Per Day/30 Days Add-Ons: ALLEVYN Heel 4 1/2in x 5 1/2in / 10.5cm x 13.5cm (Home Health) 1 x Per Day/30 Days Discharge Instructions: Apply the white, patterned surface to the heel. WOUND #3: - Sacrum Wound Laterality: Midline, Proximal Cleanser: Soap and Water (Home Health) 1 x Per Day/30 Days Discharge Instructions: Gently cleanse wound with antibacterial soap, rinse and pat dry prior to dressing wounds Cleanser: Wound Cleanser ([place]) 1 x Per Day/30 Days Discharge Instructions: Wash your hands with soap and water. Remove old dressing, discard into plastic bag and place into trash. Cleanse the wound with Wound Cleanser prior to applying a clean dressing using gauze sponges, not tissues or cotton balls. Do not scrub or use excessive force. Pat dry using gauze sponges, not tissue or cotton balls. Secondary Dressing: Conforming Guaze Roll-Medium [facility]) (Generic)  1 x Per Day/30 Days Discharge Instructions: Pack lightly with rolled gauze soaked in Dakins solution. Secured With: Bordered foam dressing. (Home Health) (Generic) 1 x Per Day/30 Days 1. In office sharp debridement 2. Medihoney and Hydrofera Blue 3. Dakin's wet-to-dry dressings 4. Aggressive offloadingooPrevalon boot 5. General surgery referraloodiverting colostomy 6. Continue oral antibiotics per ID 7. Follow-up in 4 weeks Electronic Signature(s) Signed: 06/03/2022 4:40:29 PM By: [name] Shan DO Previous Signature: 06/03/2022 4:39:06 PM Version By: [name] Shan DO Entered By: [name] on 06/03/2022 16:39:30 -------------------------------------------------------------------------------- SuperBill Details Patient Name: Date of  Service: [name] ND, [name] 06/03/2022 Medical Record Number: [id number] Patient Account Number: [id number] Date of Birth/Sex: Treating RN: Sep 04, 1960 (62 y.o. [name] Primary Care Provider: Gladstone [name] Other Clinician: Referring Provider: Treating Provider/Extender: [name] Weeks in Treatment: 4 Diagnosis Coding ICD-10 Codes Code Description L89.154 Pressure ulcer of sacral region, stage 4 L89.153 Pressure ulcer of sacral region, stage 3 L89.610 Pressure ulcer of right heel, unstageable L89.620 Pressure ulcer of left heel, unstageable G35 Multiple sclerosis I26.99 Other pulmonary embolism without acute cor pulmonale I82.409 Acute embolism and thrombosis of unspecified deep veins of unspecified lower extremity Z79.01 Long term (current) use of anticoagulants 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, sacral and sacrococcygeal region Facility Procedures : Sharol Given Code: [id number] AND, Maudene [id number] Description: [phone] - DEBRIDE WOUND 1ST 20 SQ CM OR < ICD-10 Diagnosis Description L89.610 Pressure ulcer of right heel, unstageable 33) [phone] Modifier: [id number] Quantity: 1 .pdf Page 9 of 9 Physician Procedures : CPT4 Code Description Modifier [id number] - WC PHYS LEVEL 3 - EST PT 25 ICD-10 Diagnosis Description L89.154 Pressure ulcer of sacral region, 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, sacral and sacrococcygeal region Quantity: 1 : [id number] - WC PHYS DEBR WO ANESTH 20 SQ CM ICD-10 Diagnosis Description L89.610 Pressure ulcer of right heel, unstageable Quantity: 1 Electronic Signature(s) Signed: 06/03/2022 4:40:29 PM By: [name] Shan DO Entered By: [name] on 06/03/2022 16:39:59

## 2022-06-04 NOTE — Telephone Encounter (Signed)
Courtesy called placed to NP.  Reviewed referral with patient and boyfiend and informed them that visit will be a consult only. Informed her of appt details, mask and visitor policy. All questions and concerns answered. Pt's boyfriend states that pt will be coming via ambulance and that they may be a little late.

## 2022-06-05 ENCOUNTER — Other Ambulatory Visit: Payer: Self-pay

## 2022-06-05 ENCOUNTER — Inpatient Hospital Stay: Payer: Medicare Other | Attending: Oncology | Admitting: Oncology

## 2022-06-05 ENCOUNTER — Telehealth: Payer: Self-pay

## 2022-06-05 ENCOUNTER — Encounter: Payer: Self-pay | Admitting: Oncology

## 2022-06-05 ENCOUNTER — Inpatient Hospital Stay: Payer: Medicare Other

## 2022-06-05 VITALS — BP 99/42 | HR 51 | Temp 97.7°F | Resp 18

## 2022-06-05 DIAGNOSIS — K219 Gastro-esophageal reflux disease without esophagitis: Secondary | ICD-10-CM | POA: Insufficient documentation

## 2022-06-05 DIAGNOSIS — L89154 Pressure ulcer of sacral region, stage 4: Secondary | ICD-10-CM | POA: Diagnosis not present

## 2022-06-05 DIAGNOSIS — Z79899 Other long term (current) drug therapy: Secondary | ICD-10-CM | POA: Insufficient documentation

## 2022-06-05 DIAGNOSIS — I2699 Other pulmonary embolism without acute cor pulmonale: Secondary | ICD-10-CM | POA: Diagnosis not present

## 2022-06-05 DIAGNOSIS — Z888 Allergy status to other drugs, medicaments and biological substances status: Secondary | ICD-10-CM | POA: Diagnosis not present

## 2022-06-05 DIAGNOSIS — D72821 Monocytosis (symptomatic): Secondary | ICD-10-CM | POA: Diagnosis present

## 2022-06-05 DIAGNOSIS — Z86711 Personal history of pulmonary embolism: Secondary | ICD-10-CM | POA: Diagnosis not present

## 2022-06-05 DIAGNOSIS — D72829 Elevated white blood cell count, unspecified: Secondary | ICD-10-CM | POA: Diagnosis not present

## 2022-06-05 DIAGNOSIS — Z7901 Long term (current) use of anticoagulants: Secondary | ICD-10-CM | POA: Diagnosis not present

## 2022-06-05 DIAGNOSIS — D649 Anemia, unspecified: Secondary | ICD-10-CM | POA: Diagnosis not present

## 2022-06-05 DIAGNOSIS — G35 Multiple sclerosis: Secondary | ICD-10-CM | POA: Insufficient documentation

## 2022-06-05 DIAGNOSIS — Z881 Allergy status to other antibiotic agents status: Secondary | ICD-10-CM | POA: Diagnosis not present

## 2022-06-05 DIAGNOSIS — Z800 Family history of malignant neoplasm of digestive organs: Secondary | ICD-10-CM | POA: Insufficient documentation

## 2022-06-05 DIAGNOSIS — D500 Iron deficiency anemia secondary to blood loss (chronic): Secondary | ICD-10-CM | POA: Diagnosis not present

## 2022-06-05 LAB — IRON AND TIBC
Iron: 19 ug/dL — ABNORMAL LOW (ref 28–170)
Saturation Ratios: 9 % — ABNORMAL LOW (ref 10.4–31.8)
TIBC: 206 ug/dL — ABNORMAL LOW (ref 250–450)
UIBC: 187 ug/dL

## 2022-06-05 LAB — VITAMIN B12: Vitamin B-12: 3215 pg/mL — ABNORMAL HIGH (ref 180–914)

## 2022-06-05 LAB — CBC WITH DIFFERENTIAL/PLATELET
Abs Immature Granulocytes: 2.37 10*3/uL — ABNORMAL HIGH (ref 0.00–0.07)
Basophils Absolute: 0.1 10*3/uL (ref 0.0–0.1)
Basophils Relative: 0 %
Eosinophils Absolute: 0.2 10*3/uL (ref 0.0–0.5)
Eosinophils Relative: 1 %
HCT: 24.6 % — ABNORMAL LOW (ref 36.0–46.0)
Hemoglobin: 7.3 g/dL — ABNORMAL LOW (ref 12.0–15.0)
Immature Granulocytes: 14 %
Lymphocytes Relative: 7 %
Lymphs Abs: 1.3 10*3/uL (ref 0.7–4.0)
MCH: 23.9 pg — ABNORMAL LOW (ref 26.0–34.0)
MCHC: 29.7 g/dL — ABNORMAL LOW (ref 30.0–36.0)
MCV: 80.4 fL (ref 80.0–100.0)
Monocytes Absolute: 2.2 10*3/uL — ABNORMAL HIGH (ref 0.1–1.0)
Monocytes Relative: 13 %
Neutro Abs: 11.3 10*3/uL — ABNORMAL HIGH (ref 1.7–7.7)
Neutrophils Relative %: 65 %
Platelets: 393 10*3/uL (ref 150–400)
RBC: 3.06 MIL/uL — ABNORMAL LOW (ref 3.87–5.11)
RDW: 22.5 % — ABNORMAL HIGH (ref 11.5–15.5)
WBC: 17.4 10*3/uL — ABNORMAL HIGH (ref 4.0–10.5)
nRBC: 0 % (ref 0.0–0.2)

## 2022-06-05 LAB — RETIC PANEL
Immature Retic Fract: 29.5 % — ABNORMAL HIGH (ref 2.3–15.9)
RBC.: 3.09 MIL/uL — ABNORMAL LOW (ref 3.87–5.11)
Retic Count, Absolute: 134.4 10*3/uL (ref 19.0–186.0)
Retic Ct Pct: 4.4 % — ABNORMAL HIGH (ref 0.4–3.1)
Reticulocyte Hemoglobin: 24.7 pg — ABNORMAL LOW (ref >27.9)

## 2022-06-05 LAB — SAMPLE TO BLOOD BANK

## 2022-06-05 LAB — LACTATE DEHYDROGENASE: LDH: 86 U/L — ABNORMAL LOW (ref 98–192)

## 2022-06-05 LAB — FOLATE: Folate: 7 ng/mL (ref >5.9)

## 2022-06-05 LAB — FERRITIN: Ferritin: 455 ng/mL — ABNORMAL HIGH (ref 11–307)

## 2022-06-05 NOTE — Progress Notes (Signed)
Hematology/Oncology Progress note [phone] [phone]       CHIEF COMPLAINTS/PURPOSE OF CONSULTATION:  Leukocytosis  ASSESSMENT & PLAN:   Leukocytosis Chronic leukocytosis  Previous work up  JAK2 V617F mutation negative, with reflex to other mutations CALR, MPL, JAK 2 Ex 12-15 mutations negative. EBV negative.  CMV negative BCR-ABL FISH negative.  Peripheral blood flow cytometry showed monocytosis 17% of the leukocytes, with immunophenotypic aberrancies-CD56 in a subset and down-regulation of CD13.  A nonspecific finding that can be seen in association with reactive/active processes as well as neoplastic processes.  No immature B cells detected in the specimen. 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   Bilateral pulmonary embolism (HCC) Continue Xarelto   Anemia Iron panel today showed decreased iron saturation. Decreased retic panel  Ferritin is elevated, likely falsely elevated due to infection.  I discussed about option  IV Venofer treatments. I discussed about the potential risks including but not limited to allergic reactions/infusion reactions including anaphylactic reactions, phlebitis, high blood pressure, wheezing, SOB, skin rash, weight gain,dark urine, leg swelling, back pain, headache, nausea and fatigue, etc. Patient agrees with IV venofer  Plan IV venofer weekly x 3   Decubitus ulcer of sacral region, stage 4 ([place]) With acute osteomyelitis, continue antibiotics per ID recommendation.    Orders Placed This Encounter  Procedures   CT BONE MARROW BIOPSY & ASPIRATION    Standing Status:   Future    Standing Expiration Date:   06/06/2023    Order Specific Question:   Reason for Exam (SYMPTOM  OR DIAGNOSIS REQUIRED)    Answer:   leukocytosis    Order Specific Question:   Preferred location?    Answer:   [facility]    Order Specific Question:   Radiology Contrast Protocol - do NOT remove file path     Answer:   [email or web address]    Order Specific Question:   Is patient pregnant?    Answer:   No   CBC with Differential/Platelet    Standing Status:   Future    Number of Occurrences:   1    Standing Expiration Date:   06/06/2023   Ferritin    Standing Status:   Future    Number of Occurrences:   1    Standing Expiration Date:   12/04/2022   Folate    Standing Status:   Future    Number of Occurrences:   1    Standing Expiration Date:   06/06/2023   Vitamin B12    Standing Status:   Future    Number of Occurrences:   1    Standing Expiration Date:   06/06/2023   Iron and TIBC    Standing Status:   Future    Number of Occurrences:   1    Standing Expiration Date:   06/06/2023   Lactate dehydrogenase    Standing Status:   Future    Number of Occurrences:   1    Standing Expiration Date:   06/06/2023   Retic Panel    Standing Status:   Future    Number of Occurrences:   1    Standing Expiration Date:   06/06/2023   Kappa/lambda light chains    Standing Status:   Future    Number of Occurrences:   1    Standing Expiration Date:   06/05/2023   Multiple Myeloma Panel (SPEP&IFE w/QIG)    Standing Status:   Future  Number of Occurrences:   1    Standing Expiration Date:   06/05/2023   CBC with Differential ([place] Only)    Standing Status:   Future    Standing Expiration Date:   06/06/2023   Ambulatory Referral to Palliative Care    Referral Priority:   Routine    Referral Type:   Consultation    Referral Reason:   Goals of Care    Referred to Provider:   Irean Hong, NP    Number of Visits Requested:   1   Hold Tube- Blood Bank    Standing Status:   Future    Number of Occurrences:   1    Standing Expiration Date:   06/06/2023   Sample to Blood Bank    Standing Status:   Future    Standing Expiration Date:   06/06/2023   Follow up after bone marrow biopsy.  All questions were answered. The patient knows to call the clinic  with any problems, questions or concerns.  Earlie Server, MD, PhD [facility] Hematology Oncology 06/05/2022    HISTORY OF PRESENTING ILLNESS:  [name] 62 y.o. female presents to for post hospitalization follow up for leukocytosis Patient has no showed two times since her discharge.  She is accompanied by her boyfriend.  +Stage IV  Decubitus ulcer,acute osteomyelitis  recently seen by ID, on antibiotics.  Blood work obtain by ID recently, hemoglobin of 6.7. wbc 17.4 + fatigue,  She is Xarelto for PE. Denies hematochezia, hematuria, hematemesis, epistaxis, black tarry stool or easy bruising.     MEDICAL HISTORY:  Past Medical History:  Diagnosis Date   Bilateral pulmonary embolism (HCC)    Chronic constipation    Family history of pancreatic cancer    Frequent falls    GERD (gastroesophageal reflux disease)    Hiatal hernia    Multiple sclerosis (HCC)    Pulmonary hemorrhage    RLS (restless legs syndrome)    Sepsis due to pneumonia ([place])    Tachycardia     SURGICAL HISTORY: Past Surgical History:  Procedure Laterality Date   ESOPHAGOGASTRODUODENOSCOPY (EGD) WITH PROPOFOL N/A 09/11/2021   Procedure: ESOPHAGOGASTRODUODENOSCOPY (EGD) WITH PROPOFOL;  Surgeon: Annamaria Helling, DO;  Location: [place];  Service: Gastroenterology;  Laterality: N/A;   PULMONARY THROMBECTOMY Bilateral 02/25/2022   Procedure: PULMONARY THROMBECTOMY;  Surgeon: Algernon Huxley, MD;  Location: [place] [facility];  Service: Cardiovascular;  Laterality: Bilateral;   TUBAL LIGATION      SOCIAL HISTORY: Social History   Socioeconomic History   Marital status: Widowed    Spouse name: Not on file   Number of children: Not on file   Years of education: Not on file   Highest education level: Not on file  Occupational History   Not on file  Tobacco Use   Smoking status: Never   Smokeless tobacco: Never  Vaping Use   Vaping Use: Never used  Substance and Sexual Activity    Alcohol use: Never    Comment: Social   Drug use: Never   Sexual activity: Not Currently  Other Topics Concern   Not on file  Social History Narrative   Not on file   Social Determinants of Health   Financial Resource Strain: Not on file  Food Insecurity: No Food Insecurity (04/17/2022)   Hunger Vital Sign    Worried About Running Out of Food in the Last Year: Never true    Ran Out of Food in  the Last Year: Never true  Transportation Needs: No Transportation Needs (04/17/2022)   PRAPARE - Hydrologist (Medical): No    Lack of Transportation (Non-Medical): No  Physical Activity: Not on file  Stress: Not on file  Social Connections: Not on file  Intimate Partner Violence: Not At Risk (04/17/2022)   Humiliation, Afraid, Rape, and Kick questionnaire    Fear of Current or Ex-Partner: No    Emotionally Abused: No    Physically Abused: No    Sexually Abused: No    FAMILY HISTORY: History reviewed. No pertinent family history.  ALLERGIES:  is allergic to erythromycin and lexapro [escitalopram oxalate].  MEDICATIONS:  Current Outpatient Medications  Medication Sig Dispense Refill   ascorbic acid (VITAMIN C) 500 MG tablet Take 500 mg by mouth daily.     Cholecalciferol 50 MCG (2000 UT) CAPS Take 2,000 Units by mouth daily.     cyanocobalamin 1000 MCG tablet Place 1 tablet (1,000 mcg total) into feeding tube daily.     cyclobenzaprine (FLEXERIL) 10 MG tablet Take 10 mg by mouth 2 (two) times daily as needed.     dalfampridine 10 MG TB12 Take 1 tablet by mouth every 12 (twelve) hours.     DULoxetine (CYMBALTA) 30 MG capsule Take 30 mg by mouth daily.     gabapentin (NEURONTIN) 800 MG tablet Take 400-800 mg by mouth 2 (two) times daily.     imipramine (TOFRANIL) 25 MG tablet Take 25 mg by mouth at bedtime.     levofloxacin (LEVAQUIN) 750 MG tablet Take 1 tablet (750 mg total) by mouth daily. 30 tablet 1   midodrine (PROAMATINE) 5 MG tablet Take 1 tablet (5 mg  total) by mouth 2 (two) times daily with a meal. 14 tablet 0   nutrition supplement, JUVEN, (JUVEN) PACK Place 1 packet into feeding tube 2 (two) times daily between meals.  0   Nutritional Supplements (FEEDING SUPPLEMENT, OSMOLITE 1.5 CAL,) LIQD Place 1,000 mLs into feeding tube continuous.  0   omeprazole (PRILOSEC) 20 MG capsule Take 20 mg by mouth daily.     pregabalin (LYRICA) 75 MG capsule Take 75 mg by mouth 3 (three) times daily.     propranolol (INDERAL) 20 MG tablet Take 20 mg by mouth 2 (two) times daily.     Protein (FEEDING SUPPLEMENT, PROSOURCE TF20,) liquid Place 60 mLs into feeding tube daily.     rivaroxaban (XARELTO) 20 MG TABS tablet Take 1 tablet (20 mg total) by mouth daily with supper. 30 tablet 0   rOPINIRole (REQUIP) 0.5 MG tablet Take 0.5 mg by mouth at bedtime.     traMADol-acetaminophen (ULTRACET) 37.5-325 MG tablet Take 2 tablets by mouth 2 (two) times daily.     traZODone (DESYREL) 50 MG tablet Take 50 mg by mouth at bedtime.     No current facility-administered medications for this visit.    Review of Systems  Constitutional:  Positive for appetite change and fatigue. Negative for chills and fever.  HENT:   Negative for hearing loss and voice change.   Eyes:  Negative for eye problems.  Respiratory:  Negative for chest tightness, cough and shortness of breath.   Cardiovascular:  Negative for chest pain.  Gastrointestinal:  Negative for abdominal distention, abdominal pain and blood in stool.  Endocrine: Negative for hot flashes.  Genitourinary:  Negative for difficulty urinating and frequency.   Musculoskeletal:  Negative for arthralgias and back pain.  Skin:  Negative for itching  and rash.       Sacrum ulcer  Neurological:  Negative for extremity weakness.  Hematological:  Negative for adenopathy.  Psychiatric/Behavioral:  Negative for confusion.      PHYSICAL EXAMINATION: ECOG PERFORMANCE STATUS: 2 - Symptomatic, <50% confined to bed  Vitals:    06/05/22 1004  BP: (!) 99/42  Pulse: (!) 51  Resp: 18  Temp: 97.7 F (36.5 C)  SpO2: 95%   There were no vitals filed for this visit.  Physical Exam Constitutional:      General: She is not in acute distress.    Appearance: She is ill-appearing.  HENT:     Head: Normocephalic and atraumatic.     Mouth/Throat:     Pharynx: Oropharyngeal exudate present.  Eyes:     General: No scleral icterus. Cardiovascular:     Rate and Rhythm: Normal rate.     Heart sounds: No murmur heard. Pulmonary:     Effort: Pulmonary effort is normal. No respiratory distress.     Breath sounds: No wheezing.  Abdominal:     General: Bowel sounds are normal. There is no distension.     Palpations: Abdomen is soft.  Musculoskeletal:        General: Normal range of motion.     Cervical back: Normal range of motion and neck supple.  Skin:    General: Skin is warm.     Coloration: Skin is pale.     Findings: No erythema.  Neurological:     Mental Status: She is alert and oriented to person, place, and time. Mental status is at baseline.     Cranial Nerves: No cranial nerve deficit.     Motor: No abnormal muscle tone.  Psychiatric:        Mood and Affect: Affect normal.      LABORATORY DATA:  I have reviewed the data as listed    Latest Ref Rng & Units 06/05/2022   10:56 AM 05/28/2022   12:00 PM 04/21/2022    6:18 AM  CBC  WBC 4.0 - 10.5 K/uL 17.4  16.4  11.7   Hemoglobin 12.0 - 15.0 g/dL 7.3  6.7  7.6   Hematocrit 36.0 - 46.0 % 24.6  21.8  25.4   Platelets 150 - 400 K/uL 393  151  283       Latest Ref Rng & Units 05/28/2022   12:00 PM 04/21/2022    6:18 AM 04/20/2022    5:07 AM  CMP  Glucose 70 - 99 mg/dL 105   99   BUN 6 - 20 mg/dL 11   8   Creatinine 0.44 - 1.00 mg/dL 0.46  0.43  0.46   Sodium 135 - 145 mmol/L 130   137   Potassium 3.5 - 5.1 mmol/L 3.5  4.4  3.4   Chloride 98 - 111 mmol/L 95   104   CO2 22 - 32 mmol/L 23   24   Calcium 8.9 - 10.3 mg/dL 8.5   8.2   Total Protein 6.5 -  8.1 g/dL 5.8     Total Bilirubin 0.3 - 1.2 mg/dL 0.5     Alkaline Phos 38 - 126 U/L 298     AST 15 - 41 U/L 32     ALT 0 - 44 U/L 17        RADIOGRAPHIC STUDIES: I have personally reviewed the radiological images as listed and agreed with the findings in the report. No results found.

## 2022-06-05 NOTE — Assessment & Plan Note (Signed)
Chronic leukocytosis  Previous work up  JAK2 V617F mutation negative, with reflex to other mutations CALR, MPL, JAK 2 Ex 12-15 mutations negative. EBV negative.  CMV negative BCR-ABL FISH negative.  Peripheral blood flow cytometry showed monocytosis 17% of the leukocytes, with immunophenotypic aberrancies-CD56 in a subset and down-regulation of CD13.  A nonspecific finding that can be seen in association with reactive/active processes as well as neoplastic processes.  No immature B cells detected in the specimen. 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

## 2022-06-05 NOTE — Assessment & Plan Note (Signed)
With acute osteomyelitis, continue antibiotics per ID recommendation.

## 2022-06-05 NOTE — Telephone Encounter (Signed)
Per LOS on 2/23: Bone marrow biopsy - leukocytosis 1 week after biopsy- lab - cbc hold tube MD   Request for biopsy has been faxed to IR

## 2022-06-05 NOTE — Assessment & Plan Note (Signed)
Continue Xarelto 

## 2022-06-05 NOTE — Assessment & Plan Note (Signed)
Iron panel today showed decreased iron saturation. Decreased retic panel  Ferritin is elevated, likely falsely elevated due to infection.  I discussed about option  IV Venofer treatments. I discussed about the potential risks including but not limited to allergic reactions/infusion reactions including anaphylactic reactions, phlebitis, high blood pressure, wheezing, SOB, skin rash, weight gain,dark urine, leg swelling, back pain, headache, nausea and fatigue, etc. Patient agrees with IV venofer  Plan IV venofer weekly x 3

## 2022-06-08 ENCOUNTER — Telehealth: Payer: Self-pay

## 2022-06-08 LAB — KAPPA/LAMBDA LIGHT CHAINS
Kappa free light chain: 18.2 mg/L (ref 3.3–19.4)
Kappa, lambda light chain ratio: 1.19 (ref 0.26–1.65)
Lambda free light chains: 15.3 mg/L (ref 5.7–26.3)

## 2022-06-08 NOTE — Telephone Encounter (Signed)
-----   Message from Earlie Server, MD sent at 06/05/2022  8:54 PM EST ----- Iron level is low. I recommend her to get IV venofer weekly x 4, first dose ASAP Keep currently follow up plan thanks.

## 2022-06-08 NOTE — Telephone Encounter (Signed)
Called and spoke to pt regarding low iron levels and MD recommendation. Pt verbalized understanding.   Please schedule and inform pt of appts:  Venofer weekly x4, first dose asap.   *pt will coming via ambulance to appts*

## 2022-06-09 ENCOUNTER — Inpatient Hospital Stay: Payer: Medicare Other

## 2022-06-09 VITALS — BP 93/48 | HR 98 | Temp 97.3°F | Resp 18

## 2022-06-09 DIAGNOSIS — D649 Anemia, unspecified: Secondary | ICD-10-CM

## 2022-06-09 DIAGNOSIS — D72821 Monocytosis (symptomatic): Secondary | ICD-10-CM | POA: Diagnosis not present

## 2022-06-09 MED ORDER — SODIUM CHLORIDE 0.9 % IV SOLN
Freq: Once | INTRAVENOUS | Status: AC
  Filled 2022-06-09: qty 250

## 2022-06-09 MED ORDER — SODIUM CHLORIDE 0.9 % IV SOLN
200.0000 mg | Freq: Once | INTRAVENOUS | Status: AC
  Administered 2022-06-09: 200 mg via INTRAVENOUS
  Filled 2022-06-09: qty 200

## 2022-06-09 NOTE — Patient Instructions (Signed)

## 2022-06-09 NOTE — Telephone Encounter (Signed)
Called pt and informed her of a possible appt date. She says she will need to check with transportation and she will call IR to set up appt.
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
## 2022-06-10 ENCOUNTER — Inpatient Hospital Stay (HOSPITAL_BASED_OUTPATIENT_CLINIC_OR_DEPARTMENT_OTHER): Payer: Medicare Other | Admitting: Hospice and Palliative Medicine

## 2022-06-10 ENCOUNTER — Encounter: Payer: Self-pay | Admitting: Oncology

## 2022-06-10 DIAGNOSIS — D72829 Elevated white blood cell count, unspecified: Secondary | ICD-10-CM

## 2022-06-10 DIAGNOSIS — Z515 Encounter for palliative care: Secondary | ICD-10-CM | POA: Diagnosis not present

## 2022-06-10 LAB — MULTIPLE MYELOMA PANEL, SERUM
Albumin SerPl Elph-Mcnc: 2.3 g/dL — ABNORMAL LOW (ref 2.9–4.4)
Albumin/Glob SerPl: 0.8 (ref 0.7–1.7)
Alpha 1: 0.5 g/dL — ABNORMAL HIGH (ref 0.0–0.4)
Alpha2 Glob SerPl Elph-Mcnc: 1.2 g/dL — ABNORMAL HIGH (ref 0.4–1.0)
B-Globulin SerPl Elph-Mcnc: 0.8 g/dL (ref 0.7–1.3)
Gamma Glob SerPl Elph-Mcnc: 0.5 g/dL (ref 0.4–1.8)
Globulin, Total: 3.1 g/dL (ref 2.2–3.9)
IgA: 159 mg/dL (ref 87–352)
IgG (Immunoglobin G), Serum: 560 mg/dL — ABNORMAL LOW (ref 586–1602)
IgM (Immunoglobulin M), Srm: 31 mg/dL (ref 26–217)
Total Protein ELP: 5.4 g/dL — ABNORMAL LOW (ref 6.0–8.5)

## 2022-06-10 NOTE — Progress Notes (Signed)
Virtual Visit via Telephone Note  I connected with [name] on 06/10/22 at  1:00 PM EST by telephone and verified that I am speaking with the correct person using two identifiers.  Location: Patient: Home Provider: Clinic   I discussed the limitations, risks, security and privacy concerns of performing an evaluation and management service by telephone and the availability of in person appointments. I also discussed with the patient that there may be a patient responsible charge related to this service. The patient expressed understanding and agreed to proceed.   History of Present Illness: [name] is a 62 year old woman with multiple medical problems including hypertension, depression, anxiety, restless leg syndrome, GERD, multiple sclerosis, history of bilateral PE/DVT status post pulmonary thrombectomy with previous intubation.  Patient has an extensive medical history with multiple hospitalizations including a prolonged stay at an [facility].  She has a stage IV sacral decubitus with osteomyelitis.  Patient is currently undergoing workup for leukocytosis.  She was referred to palliative care to address goals.   Observations/Objective: I called and spoke with patient by phone.  Patient is mostly bedbound with a large sacral decubitus ulcer.  She is recently seen Dr. Tasia Catchings for evaluation leukocytosis with recommendation for bone marrow biopsy due to concern for possible CMML.   Patient admits that her health is quite poor.  At baseline, she lives at home with her boyfriend.  She does require assistance with ADLs and has a caregiver in the home twice weekly.  Patient says this is not enough care and has requested more resources at home.  Will consult acuity palliative care team to follow.  Patient says that she is in agreement with workup for leukocytosis including bone marrow biopsy.  She says that she has witnessed cancer treatment as her son had leukemia.  She would  be in agreement with pursuing treatment if any options are available.  Unclear how aggressive 6 treatments could be in the context of her extremely limited performance status.    Assessment and Plan: Leukocytosis-patient in agreement with pursuing workup  Weakness/debility -referral to community palliative care  Follow Up Instructions: TBD   I discussed the assessment and treatment plan with the patient. The patient was provided an opportunity to ask questions and all were answered. The patient agreed with the plan and demonstrated an understanding of the instructions.   The patient was advised to call back or seek an in-person evaluation if the symptoms worsen or if the condition fails to improve as anticipated.  I provided 10 minutes of non-face-to-face time during this encounter.   Irean Hong, NP

## 2022-06-10 NOTE — Telephone Encounter (Signed)
Pt confirmed appt for bx on 3/6 '@9'$ :30 with arrival 8:30a.   Please schedule lab/ MD approx 1 week after biopsy. Pt will come to appt via EMS.

## 2022-06-11 ENCOUNTER — Telehealth: Payer: Self-pay

## 2022-06-11 NOTE — Telephone Encounter (Signed)
145 pm.  Phone call made to patient to schedule a home visit.  Patient requested visit for next week due to multiple appointments.  Visit scheduled for next Wednesday at 330 pm.

## 2022-06-16 ENCOUNTER — Other Ambulatory Visit: Payer: Self-pay | Admitting: Internal Medicine

## 2022-06-16 ENCOUNTER — Inpatient Hospital Stay: Payer: Medicare Other

## 2022-06-16 ENCOUNTER — Inpatient Hospital Stay: Payer: Medicare Other | Attending: Oncology

## 2022-06-16 VITALS — BP 89/45 | HR 94 | Temp 98.8°F | Resp 18

## 2022-06-16 DIAGNOSIS — Z800 Family history of malignant neoplasm of digestive organs: Secondary | ICD-10-CM | POA: Diagnosis not present

## 2022-06-16 DIAGNOSIS — Z881 Allergy status to other antibiotic agents status: Secondary | ICD-10-CM | POA: Insufficient documentation

## 2022-06-16 DIAGNOSIS — Z79899 Other long term (current) drug therapy: Secondary | ICD-10-CM | POA: Diagnosis not present

## 2022-06-16 DIAGNOSIS — K219 Gastro-esophageal reflux disease without esophagitis: Secondary | ICD-10-CM | POA: Diagnosis not present

## 2022-06-16 DIAGNOSIS — R5382 Chronic fatigue, unspecified: Secondary | ICD-10-CM | POA: Insufficient documentation

## 2022-06-16 DIAGNOSIS — I2699 Other pulmonary embolism without acute cor pulmonale: Secondary | ICD-10-CM | POA: Insufficient documentation

## 2022-06-16 DIAGNOSIS — D649 Anemia, unspecified: Secondary | ICD-10-CM | POA: Diagnosis not present

## 2022-06-16 DIAGNOSIS — D72821 Monocytosis (symptomatic): Secondary | ICD-10-CM | POA: Insufficient documentation

## 2022-06-16 DIAGNOSIS — D72829 Elevated white blood cell count, unspecified: Secondary | ICD-10-CM

## 2022-06-16 DIAGNOSIS — Z888 Allergy status to other drugs, medicaments and biological substances status: Secondary | ICD-10-CM | POA: Insufficient documentation

## 2022-06-16 DIAGNOSIS — L89154 Pressure ulcer of sacral region, stage 4: Secondary | ICD-10-CM | POA: Diagnosis not present

## 2022-06-16 DIAGNOSIS — Z86711 Personal history of pulmonary embolism: Secondary | ICD-10-CM | POA: Diagnosis not present

## 2022-06-16 DIAGNOSIS — Z7901 Long term (current) use of anticoagulants: Secondary | ICD-10-CM | POA: Insufficient documentation

## 2022-06-16 MED ORDER — SODIUM CHLORIDE 0.9 % IV SOLN
200.0000 mg | Freq: Once | INTRAVENOUS | Status: AC
  Administered 2022-06-16: 200 mg via INTRAVENOUS
  Filled 2022-06-16: qty 200

## 2022-06-16 MED ORDER — SODIUM CHLORIDE 0.9 % IV SOLN
Freq: Once | INTRAVENOUS | Status: DC
  Filled 2022-06-16: qty 250

## 2022-06-16 NOTE — Patient Instructions (Signed)

## 2022-06-16 NOTE — Progress Notes (Signed)
Patient for Bone Marrow Biopsy on Wed 06/17/2022, I called and spoke with the patient on  the phone and gave pre-procedure instructions. Pt was made aware to be here at 8:30a, NPO after MN prior to procedure as well as driver post procedure/recovery/discharge. Pt stated understanding.  Called 06/16/2022

## 2022-06-17 ENCOUNTER — Other Ambulatory Visit: Payer: Self-pay

## 2022-06-17 ENCOUNTER — Encounter: Payer: Self-pay | Admitting: Oncology

## 2022-06-17 ENCOUNTER — Ambulatory Visit
Admission: RE | Admit: 2022-06-17 | Discharge: 2022-06-17 | Disposition: A | Discharge status: Home / Self Care | Payer: Medicare Other | Source: Ambulatory Visit | Attending: Oncology | Admitting: Oncology

## 2022-06-17 ENCOUNTER — Other Ambulatory Visit: Payer: Medicare Other

## 2022-06-17 DIAGNOSIS — Z993 Dependence on wheelchair: Secondary | ICD-10-CM | POA: Diagnosis not present

## 2022-06-17 DIAGNOSIS — G8220 Paraplegia, unspecified: Secondary | ICD-10-CM | POA: Diagnosis not present

## 2022-06-17 DIAGNOSIS — Z515 Encounter for palliative care: Secondary | ICD-10-CM

## 2022-06-17 DIAGNOSIS — D649 Anemia, unspecified: Secondary | ICD-10-CM

## 2022-06-17 DIAGNOSIS — Z86711 Personal history of pulmonary embolism: Secondary | ICD-10-CM | POA: Insufficient documentation

## 2022-06-17 DIAGNOSIS — Z1379 Encounter for other screening for genetic and chromosomal anomalies: Secondary | ICD-10-CM | POA: Insufficient documentation

## 2022-06-17 DIAGNOSIS — D509 Iron deficiency anemia, unspecified: Secondary | ICD-10-CM | POA: Diagnosis present

## 2022-06-17 DIAGNOSIS — G35 Multiple sclerosis: Secondary | ICD-10-CM | POA: Diagnosis not present

## 2022-06-17 DIAGNOSIS — D72829 Elevated white blood cell count, unspecified: Secondary | ICD-10-CM | POA: Diagnosis not present

## 2022-06-17 DIAGNOSIS — Z8616 Personal history of COVID-19: Secondary | ICD-10-CM | POA: Insufficient documentation

## 2022-06-17 LAB — CBC WITH DIFFERENTIAL/PLATELET
Abs Immature Granulocytes: 1.75 10*3/uL — ABNORMAL HIGH (ref 0.00–0.07)
Basophils Absolute: 0.1 10*3/uL (ref 0.0–0.1)
Basophils Relative: 1 %
Eosinophils Absolute: 0.1 10*3/uL (ref 0.0–0.5)
Eosinophils Relative: 1 %
HCT: 31.8 % — ABNORMAL LOW (ref 36.0–46.0)
Hemoglobin: 9.2 g/dL — ABNORMAL LOW (ref 12.0–15.0)
Immature Granulocytes: 10 %
Lymphocytes Relative: 8 %
Lymphs Abs: 1.4 10*3/uL (ref 0.7–4.0)
MCH: 23.1 pg — ABNORMAL LOW (ref 26.0–34.0)
MCHC: 28.9 g/dL — ABNORMAL LOW (ref 30.0–36.0)
MCV: 79.7 fL — ABNORMAL LOW (ref 80.0–100.0)
Monocytes Absolute: 1.6 10*3/uL — ABNORMAL HIGH (ref 0.1–1.0)
Monocytes Relative: 9 %
Neutro Abs: 12.3 10*3/uL — ABNORMAL HIGH (ref 1.7–7.7)
Neutrophils Relative %: 71 %
Platelets: 493 10*3/uL — ABNORMAL HIGH (ref 150–400)
RBC: 3.99 MIL/uL (ref 3.87–5.11)
RDW: 21.8 % — ABNORMAL HIGH (ref 11.5–15.5)
Smear Review: NORMAL
WBC: 17.3 10*3/uL — ABNORMAL HIGH (ref 4.0–10.5)
nRBC: 0 % (ref 0.0–0.2)

## 2022-06-17 MED ORDER — FENTANYL CITRATE (PF) 100 MCG/2ML IJ SOLN
INTRAMUSCULAR | Status: AC | PRN
  Administered 2022-06-17: 25 ug via INTRAVENOUS
  Administered 2022-06-17: 50 ug via INTRAVENOUS

## 2022-06-17 MED ORDER — LIDOCAINE HCL (PF) 1 % IJ SOLN
10.0000 mL | Freq: Once | INTRAMUSCULAR | Status: AC
  Administered 2022-06-17: 10 mL
  Filled 2022-06-17: qty 10

## 2022-06-17 MED ORDER — HEPARIN SOD (PORK) LOCK FLUSH 100 UNIT/ML IV SOLN
INTRAVENOUS | Status: AC
  Filled 2022-06-17: qty 5

## 2022-06-17 MED ORDER — MIDAZOLAM HCL 2 MG/2ML IJ SOLN
INTRAMUSCULAR | Status: AC
  Filled 2022-06-17: qty 2

## 2022-06-17 MED ORDER — MIDAZOLAM HCL 5 MG/5ML IJ SOLN
INTRAMUSCULAR | Status: AC | PRN
  Administered 2022-06-17: 1 mg via INTRAVENOUS

## 2022-06-17 MED ORDER — FENTANYL CITRATE (PF) 100 MCG/2ML IJ SOLN
INTRAMUSCULAR | Status: AC
  Filled 2022-06-17: qty 2

## 2022-06-17 MED ORDER — SODIUM CHLORIDE 0.9 % IV SOLN: INTRAVENOUS | Status: DC

## 2022-06-17 NOTE — H&P (Signed)
Chief Complaint: Patient was seen in consultation today for bone marrow biopsy with aspiration  Referring Physician(s): Yu,Zhou  Supervising Physician: Aletta Edouard  Patient Status: [facility] - Out-pt  History of Present Illness: [name] is a 62 y.o. female with a medical history significant for multiple sclerosis (paraplegia; wheelchair bound), chronic tachycardia, unstageable decubitus ulcers on sacrum and bilateral buttocks and hospitalization 02/24/22-03/20/22 for acute PE s/p PE thrombectomy by Vascular Surgery. Her hospital course was complicated by sepsis secondary to COVID pneumonia requiring intubation. She was discharged to [facility]   Hematology/Oncology was consulted during this hospitalization for evaluation of chronic leukocytosis. 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 She was hospitalized again 04/16/22-04/21/22 for sepsis.    Interventional Radiology has been asked to evaluate this patient for an image-guided bone marrow biopsy with aspiration for further work up.   Past Medical History:  Diagnosis Date   Bilateral pulmonary embolism (HCC)    Chronic constipation    Family history of pancreatic cancer    Frequent falls    GERD (gastroesophageal reflux disease)    Hiatal hernia    Multiple sclerosis (HCC)    Pulmonary hemorrhage    RLS (restless legs syndrome)    Sepsis due to pneumonia ([place])    Tachycardia     Past Surgical History:  Procedure Laterality Date   ESOPHAGOGASTRODUODENOSCOPY (EGD) WITH PROPOFOL N/A 09/11/2021   Procedure: ESOPHAGOGASTRODUODENOSCOPY (EGD) WITH PROPOFOL;  Surgeon: Annamaria Helling, DO;  Location: [place];  Service: Gastroenterology;  Laterality: N/A;   PULMONARY THROMBECTOMY Bilateral 02/25/2022   Procedure: PULMONARY THROMBECTOMY;  Surgeon: Algernon Huxley, MD;  Location: [place] [facility];  Service: Cardiovascular;  Laterality:  Bilateral;   TUBAL LIGATION      Allergies: Erythromycin and Lexapro [escitalopram oxalate]  Medications: Prior to Admission medications   Medication Sig Start Date End Date Taking? Authorizing Provider  ascorbic acid (VITAMIN C) 500 MG tablet Take 500 mg by mouth daily.    [provider]  Cholecalciferol 50 MCG (2000 UT) CAPS Take 2,000 Units by mouth daily.    [provider]  collagenase (SANTYL) 250 UNIT/GM ointment Apply 1 Application topically daily. 05/06/22   [provider]  cyanocobalamin 1000 MCG tablet Place 1 tablet (1,000 mcg total) into feeding tube daily. 03/21/22   Jennye Boroughs, MD  cyclobenzaprine (FLEXERIL) 10 MG tablet Take 10 mg by mouth 2 (two) times daily as needed. 11/26/21   [provider]  dalfampridine 10 MG TB12 Take 1 tablet by mouth every 12 (twelve) hours. 12/29/21   [provider]  DULoxetine (CYMBALTA) 30 MG capsule Take 30 mg by mouth daily. 06/25/21   [provider]  gabapentin (NEURONTIN) 800 MG tablet Take 400-800 mg by mouth 2 (two) times daily. 04/14/22   [provider]  HYDROcodone-acetaminophen (NORCO/VICODIN) 5-325 MG tablet Take 1 tablet by mouth every 6 (six) hours as needed for moderate pain. 05/12/22   [provider]  imipramine (TOFRANIL) 25 MG tablet Take 25 mg by mouth at bedtime.    [provider]  levofloxacin (LEVAQUIN) 750 MG tablet Take 1 tablet (750 mg total) by mouth daily. 05/28/22   Tsosie Billing, MD  midodrine (PROAMATINE) 5 MG tablet Take 1 tablet (5 mg total) by mouth 2 (two) times daily with a meal. 04/21/22   Jennye Boroughs, MD  nutrition supplement, JUVEN, (JUVEN) PACK Place 1 packet into feeding tube 2 (two) times daily  between meals. 03/21/22   Jennye Boroughs, MD  Nutritional Supplements (FEEDING SUPPLEMENT, OSMOLITE 1.5 CAL,) LIQD Place 1,000 mLs into feeding tube continuous. 03/20/22   Jennye Boroughs, MD  omeprazole (PRILOSEC) 20 MG capsule  Take 20 mg by mouth daily.    [provider]  pregabalin (LYRICA) 75 MG capsule Take 75 mg by mouth 3 (three) times daily. 08/18/21   [provider]  propranolol (INDERAL) 20 MG tablet Take 20 mg by mouth 2 (two) times daily. 04/09/22   [provider]  Protein (FEEDING SUPPLEMENT, PROSOURCE TF20,) liquid Place 60 mLs into feeding tube daily. 03/21/22   Jennye Boroughs, MD  rivaroxaban (XARELTO) 20 MG TABS tablet Take 1 tablet (20 mg total) by mouth daily with supper. 04/21/22   Jennye Boroughs, MD  rOPINIRole (REQUIP) 0.5 MG tablet Take 0.5 mg by mouth at bedtime. 03/31/22   [provider]  tolterodine (DETROL LA) 2 MG 24 hr capsule Take 2 mg by mouth daily.    [provider]  traMADol (ULTRAM) 50 MG tablet Take 50 mg by mouth every 12 (twelve) hours as needed for moderate pain. 06/08/22 06/23/22  [provider]  traMADol-acetaminophen (ULTRACET) 37.5-325 MG tablet Take 2 tablets by mouth 2 (two) times daily. 05/27/22   [provider]  traZODone (DESYREL) 50 MG tablet Take 50 mg by mouth at bedtime. 03/31/22   [provider]     History reviewed. No pertinent family history.  Social History   Socioeconomic History   Marital status: Widowed    Spouse name: Not on file   Number of children: Not on file   Years of education: Not on file   Highest education level: Not on file  Occupational History   Not on file  Tobacco Use   Smoking status: Never   Smokeless tobacco: Never  Vaping Use   Vaping Use: Never used  Substance and Sexual Activity   Alcohol use: Never   Drug use: Never   Sexual activity: Not Currently  Other Topics Concern   Not on file  Social History Narrative   Not on file   Social Determinants of Health   Financial Resource Strain: Not on file  Food Insecurity: No Food Insecurity (04/17/2022)   Hunger Vital Sign    Worried About Running Out of Food in the Last Year: Never true    Ran Out of Food  in the Last Year: Never true  Transportation Needs: No Transportation Needs (04/17/2022)   PRAPARE - Hydrologist (Medical): No    Lack of Transportation (Non-Medical): No  Physical Activity: Not on file  Stress: Not on file  Social Connections: Not on file    Review of Systems: A 12 point ROS discussed and pertinent positives are indicated in the HPI above.  All other systems are negative.  Review of Systems  Constitutional:  Positive for fatigue.  Respiratory:  Negative for cough and shortness of breath.   Cardiovascular:  Negative for chest pain and leg swelling.  Gastrointestinal:  Positive for nausea. Negative for abdominal pain, diarrhea and vomiting.  Musculoskeletal:  Positive for arthralgias, back pain and myalgias.  Neurological:  Negative for dizziness and headaches.    Vital Signs: BP 111/75   Pulse (!) 121   Temp 98.3 F (36.8 C) (Oral)   Resp 18   Ht '5\' 2"'$  (1.575 m)   Wt 138 lb (62.6 kg)   SpO2 97%   BMI 25.24 kg/m  Physical Exam Constitutional:      General: She is not in acute distress. Cardiovascular:     Rate and Rhythm: Tachycardia present.     Pulses: Normal pulses.     Heart sounds: Normal heart sounds.  Pulmonary:     Effort: Pulmonary effort is normal.     Breath sounds: Normal breath sounds.  Abdominal:     General: Bowel sounds are normal.     Palpations: Abdomen is soft.     Tenderness: There is no abdominal tenderness.  Musculoskeletal:     Right lower leg: No edema.     Left lower leg: No edema.  Skin:    General: Skin is warm and dry.     Comments: Sacral and buttock ulcers; not viewed.   Neurological:     Mental Status: She is alert and oriented to person, place, and time.     Imaging: No results found.  Labs:  CBC: Recent Labs    04/21/22 0618 05/28/22 1200 06/05/22 1056 06/17/22 0917  WBC 11.7* 16.4* 17.4* 17.3*  HGB 7.6* 6.7* 7.3* 9.2*  HCT 25.4* 21.8* 24.6* 31.8*  PLT 283 151 393 493*     COAGS: Recent Labs    02/24/22 1854 03/13/22 1653  INR 1.3* 1.2  APTT 47* 41*  26.4    BMP: Recent Labs    04/18/22 0440 04/19/22 0627 04/20/22 0507 04/21/22 0618 05/28/22 1200  NA 134* 134* 137  --  130*  K 3.9 3.6 3.4* 4.4 3.5  CL 105 102 104  --  95*  CO2 '22 22 24  '$ --  23  GLUCOSE 99 95 99  --  105*  BUN '6 7 8  '$ --  11  CALCIUM 8.1* 8.0* 8.2*  --  8.5*  CREATININE 0.41* 0.46 0.46 0.43* 0.46  GFRNONAA >60 >60 >60 >60 >60    LIVER FUNCTION TESTS: Recent Labs    04/16/22 1307 04/17/22 0410 04/18/22 0440 05/28/22 1200  BILITOT 0.7 1.0 0.8 0.5  AST 30 13* 12* 32  ALT '23 16 16 17  '$ ALKPHOS 184* 127* 142* 298*  PROT 7.2 5.1* 5.4* 5.8*  ALBUMIN 3.1* 2.1* 2.3* 2.2*    TUMOR MARKERS: No results for input(s): "AFPTM", "CEA", "CA199", "CHROMGRNA" in the last 8760 hours.  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  Risks and benefits of this procedure were discussed with the patient and/or patient's family including, but not limited to bleeding, infection, damage to adjacent structures or low yield requiring additional tests.  All of the questions were answered and there is agreement to proceed. She has been NPO. She is a full code.   Consent signed and in chart.  Thank you for this interesting consult.  I greatly enjoyed meeting [employer] and look forward to participating in their care.  A copy of this report was sent to the requesting provider on this date.  Electronically Signed: Soyla Dryer, AGACNP-BC [phone] 06/17/2022, 9:49 AM   I spent a total of  30 Minutes   in face to face in clinical consultation, greater than 50% of which was counseling/coordinating care for bone marrow biopsy with aspiration.

## 2022-06-17 NOTE — Procedures (Signed)
Interventional Radiology Procedure Note  Procedure: CT guided bone marrow aspiration and biopsy  Complications: None  EBL: < 10 mL  Findings: Aspirate and core biopsy performed of bone marrow in right iliac bone.  Plan: Bedrest supine x 1 hrs  Corie Allis T. Mehdi Gironda, M.D Pager:  [phone]   

## 2022-06-17 NOTE — Progress Notes (Signed)
Patient clinically stable post BMB per Dr Kathlene Cote. Vitals stable pre and post procedure. Awake/alert and oriented post procedure. Received Versed 1 mg along with Fentanyl 75 mcg IV  for procedure. Report given to Gari Crown Rn post procedure/329

## 2022-06-18 ENCOUNTER — Ambulatory Visit: Payer: Medicare Other | Attending: Infectious Diseases | Admitting: Infectious Diseases

## 2022-06-18 ENCOUNTER — Encounter: Payer: Self-pay | Admitting: Infectious Diseases

## 2022-06-18 DIAGNOSIS — M869 Osteomyelitis, unspecified: Secondary | ICD-10-CM | POA: Insufficient documentation

## 2022-06-18 DIAGNOSIS — D649 Anemia, unspecified: Secondary | ICD-10-CM | POA: Insufficient documentation

## 2022-06-18 DIAGNOSIS — G35 Multiple sclerosis: Secondary | ICD-10-CM | POA: Insufficient documentation

## 2022-06-18 DIAGNOSIS — L89154 Pressure ulcer of sacral region, stage 4: Secondary | ICD-10-CM | POA: Insufficient documentation

## 2022-06-18 DIAGNOSIS — D72829 Elevated white blood cell count, unspecified: Secondary | ICD-10-CM | POA: Diagnosis not present

## 2022-06-18 DIAGNOSIS — M4628 Osteomyelitis of vertebra, sacral and sacrococcygeal region: Secondary | ICD-10-CM | POA: Diagnosis not present

## 2022-06-18 DIAGNOSIS — M8610 Other acute osteomyelitis, unspecified site: Secondary | ICD-10-CM

## 2022-06-18 NOTE — Progress Notes (Signed)
NAME: [name]  DOB: [birth year]  MRN: [id number]  Date/Time: 06/18/2022 9:18 AM   The purpose of this virtual visit is to provide medical care while limiting exposure to the novel coronavirus (COVID19) for both patient and office staff.   Consent was obtained for video visit:  Yes.   Answered questions that patient had about telehealth interaction:  Yes.   I discussed the limitations, risks, security and privacy concerns of performing an evaluation and management service by telephone. I also discussed with the patient that there may be a patient responsible charge related to this service. The patient expressed understanding and agreed to proceed.   Patient Location: Home Provider Location:office Provider, patient and parrtner on the video visit  Subjective:   Sacral decubitus Stage IV with acute osteomyelitis ? [name] is a 62 y.o. female with a history of multiple sclerosis on Dalfampridine, HTN, RLS, depresison Who had a prolonged hospitalizaion [place] 02/24/22-03/20/22 for acute PE  with moderate bioburden involving both lungs.She had covid before her presentation to the hospital ., She underwent thrombectomy on 02/25/22, developed acute hemoptysis requiring emergent intubation , emergent bronch to remove clots, blood transfusion. Complicated course with unresponsiveness persistent leucocytosis and finally extubated on 03/15/22 and sent to [facility] on 03/20/22 where she stayed for 2 weeks-  Some where along the hospital stays she developed sacral decubitus and she was back in [facility] for a short period 2nd week of Jan when the lesion was noted    She was followed at wound clinic and underwent a sacral bone biopsy on 05/06/22 and it was acute osteo- culture was proteus and klebsiella- Partner is doing dressing changes along with home health nurse Saw the patient 2 weeks ago and started her on Levaquin to be given for a total of 4 to 6 weeks She is tolerating Levaquin. No side  effects No diarrhea No pain abdomen no skin rash Patient had bone marrow biopsy yesterday for leukocytosis She states there considering diverting colostomy and then placement of wound VAC  Past Medical History:  Diagnosis Date   Bilateral pulmonary embolism (HCC)    Chronic constipation    Family history of pancreatic cancer    Frequent falls    GERD (gastroesophageal reflux disease)    Hiatal hernia    Multiple sclerosis (HCC)    Pulmonary hemorrhage    RLS (restless legs syndrome)    Sepsis due to pneumonia ([place])    Tachycardia     Past Surgical History:  Procedure Laterality Date   ESOPHAGOGASTRODUODENOSCOPY (EGD) WITH PROPOFOL N/A 09/11/2021   Procedure: ESOPHAGOGASTRODUODENOSCOPY (EGD) WITH PROPOFOL;  Surgeon: Annamaria Helling, DO;  Location: [facility];  Service: Gastroenterology;  Laterality: N/A;   PULMONARY THROMBECTOMY Bilateral 02/25/2022   Procedure: PULMONARY THROMBECTOMY;  Surgeon: Algernon Huxley, MD;  Location: [place] [facility];  Service: Cardiovascular;  Laterality: Bilateral;   TUBAL LIGATION      Social History   Socioeconomic History   Marital status: Widowed    Spouse name: Not on file   Number of children: Not on file   Years of education: Not on file   Highest education level: Not on file  Occupational History   Not on file  Tobacco Use   Smoking status: Never   Smokeless tobacco: Never  Vaping Use   Vaping Use: Never used  Substance and Sexual Activity   Alcohol use: Never   Drug use: Never   Sexual activity: Not Currently  Other Topics Concern  Not on file  Social History Narrative   Not on file   Social Determinants of Health   Financial Resource Strain: Not on file  Food Insecurity: No Food Insecurity (04/17/2022)   Hunger Vital Sign    Worried About Running Out of Food in the Last Year: Never true    Ran Out of Food in the Last Year: Never true  Transportation Needs: No Transportation Needs (04/17/2022)   PRAPARE -  Hydrologist (Medical): No    Lack of Transportation (Non-Medical): No  Physical Activity: Not on file  Stress: Not on file  Social Connections: Not on file  Intimate Partner Violence: Not At Risk (04/17/2022)   Humiliation, Afraid, Rape, and Kick questionnaire    Fear of Current or Ex-Partner: No    Emotionally Abused: No    Physically Abused: No    Sexually Abused: No    No family history on file. Allergies  Allergen Reactions   Erythromycin Hives and Nausea And Vomiting   Lexapro [Escitalopram Oxalate] Hives   I? Current Outpatient Medications  Medication Sig Dispense Refill   ascorbic acid (VITAMIN C) 500 MG tablet Take 500 mg by mouth daily.     Cholecalciferol 50 MCG (2000 UT) CAPS Take 2,000 Units by mouth daily.     collagenase (SANTYL) 250 UNIT/GM ointment Apply 1 Application topically daily.     cyanocobalamin 1000 MCG tablet Place 1 tablet (1,000 mcg total) into feeding tube daily.     cyclobenzaprine (FLEXERIL) 10 MG tablet Take 10 mg by mouth 2 (two) times daily as needed.     dalfampridine 10 MG TB12 Take 1 tablet by mouth every 12 (twelve) hours.     DULoxetine (CYMBALTA) 30 MG capsule Take 30 mg by mouth daily.     gabapentin (NEURONTIN) 800 MG tablet Take 400-800 mg by mouth 2 (two) times daily.     HYDROcodone-acetaminophen (NORCO/VICODIN) 5-325 MG tablet Take 1 tablet by mouth every 6 (six) hours as needed for moderate pain.     imipramine (TOFRANIL) 25 MG tablet Take 25 mg by mouth at bedtime.     levofloxacin (LEVAQUIN) 750 MG tablet Take 1 tablet (750 mg total) by mouth daily. 30 tablet 1   midodrine (PROAMATINE) 5 MG tablet Take 1 tablet (5 mg total) by mouth 2 (two) times daily with a meal. 14 tablet 0   nutrition supplement, JUVEN, (JUVEN) PACK Place 1 packet into feeding tube 2 (two) times daily between meals.  0   Nutritional Supplements (FEEDING SUPPLEMENT, OSMOLITE 1.5 CAL,) LIQD Place 1,000 mLs into feeding tube continuous.  0    omeprazole (PRILOSEC) 20 MG capsule Take 20 mg by mouth daily.     pregabalin (LYRICA) 75 MG capsule Take 75 mg by mouth 3 (three) times daily.     propranolol (INDERAL) 20 MG tablet Take 20 mg by mouth 2 (two) times daily.     Protein (FEEDING SUPPLEMENT, PROSOURCE TF20,) liquid Place 60 mLs into feeding tube daily.     rivaroxaban (XARELTO) 20 MG TABS tablet Take 1 tablet (20 mg total) by mouth daily with supper. 30 tablet 0   rOPINIRole (REQUIP) 0.5 MG tablet Take 0.5 mg by mouth at bedtime.     tolterodine (DETROL LA) 2 MG 24 hr capsule Take 2 mg by mouth daily.     traMADol (ULTRAM) 50 MG tablet Take 50 mg by mouth every 12 (twelve) hours as needed for moderate pain.  traMADol-acetaminophen (ULTRACET) 37.5-325 MG tablet Take 2 tablets by mouth 2 (two) times daily.     traZODone (DESYREL) 50 MG tablet Take 50 mg by mouth at bedtime.     No current facility-administered medications for this visit.     Abtx:  Anti-infectives (From admission, onward)    None       REVIEW OF SYSTEMS:  Const: No fever or chills Eyes: negative diplopia or visual changes, negative eye pain ENT: negative coryza, negative sore throat Resp: negative cough, hemoptysis, dyspnea Cards: negative for chest pain, palpitations, lower extremity edema GU: negative for frequency, dysuria and hematuria GI: Negative for abdominal pain, diarrhea, bleeding, constipation Skin: negative for rash and pruritus Heme: negative for easy bruising and gum/nose bleeding MS: weakness- as above Neurolo:negative for headaches, dizziness, vertigo, memory problems  Psych:  anxiety, depression  Endocrine: negative for thyroid, diabetes Allergy/Immunology- erythromycin and lexapro Objective:  VITALS:  There were no vitals taken for this visit. Patient looked happy with noted stress on the video call I was unable to see the sacral decubitus Patient said she would upload a picture Pertinent Labs Lab Results CBC     Component Value Date/Time   WBC 17.3 (H) 06/17/2022 0917   RBC 3.99 06/17/2022 0917   HGB 9.2 (L) 06/17/2022 0917   HCT 31.8 (L) 06/17/2022 0917   PLT 493 (H) 06/17/2022 0917   MCV 79.7 (L) 06/17/2022 0917   MCH 23.1 (L) 06/17/2022 0917   MCHC 28.9 (L) 06/17/2022 0917   RDW 21.8 (H) 06/17/2022 0917   LYMPHSABS 1.4 06/17/2022 0917   MONOABS 1.6 (H) 06/17/2022 0917   EOSABS 0.1 06/17/2022 0917   BASOSABS 0.1 06/17/2022 0917       Latest Ref Rng & Units 05/28/2022   12:00 PM 04/21/2022    6:18 AM 04/20/2022    5:07 AM  CMP  Glucose 70 - 99 mg/dL 105   99   BUN 6 - 20 mg/dL 11   8   Creatinine 0.44 - 1.00 mg/dL 0.46  0.43  0.46   Sodium 135 - 145 mmol/L 130   137   Potassium 3.5 - 5.1 mmol/L 3.5  4.4  3.4   Chloride 98 - 111 mmol/L 95   104   CO2 22 - 32 mmol/L 23   24   Calcium 8.9 - 10.3 mg/dL 8.5   8.2   Total Protein 6.5 - 8.1 g/dL 5.8     Total Bilirubin 0.3 - 1.2 mg/dL 0.5     Alkaline Phos 38 - 126 U/L 298     AST 15 - 41 U/L 32     ALT 0 - 44 U/L 17      ? Impression/Recommendation Stage IV sacral decubitus with acute osteomyelitis-Bone biopsy showed acute OM, culture was proteus and klebsiella on levaquin- tolerating the medicine - will need for 6 weeks if she tolerates Says the wound is trying to improve  but to prevent  contamination with stool she is considering diverting colostomy and then wound vac  Leucocytosis- underwent BM biopsy yesterday  Multiple sclerosis-   Recent hospitalization for b/l PE, complicated by pulmonary hemorrhage following mechanical thrombectomy, was intubated for 10 days.   Anemia  ?__________________________________________________ Discussed with patient, in detail Total time spent on this video call was 17 minutes  Note:  This document was prepared using Dragon voice recognition software and may include unintentional dictation errors.

## 2022-06-19 LAB — SURGICAL PATHOLOGY

## 2022-06-19 NOTE — Progress Notes (Signed)
PATIENT NAME: [name] DOB: [birth year] MRN: [id number]  PRIMARY CARE PROVIDER: Gladstone Lighter, MD  RESPONSIBLE PARTY:  Acct ID - Guarantor Home Phone Work Phone Relationship Acct Type  [id number] Rubye [id number]  Self P/F     [address], [place], [place] [address]   Home visit completed with patient and significant other Dan.  ACP:  Patient advised POA is already in place and she has designated Chief of Staff.  We discussed code status and patient would like to be a DNR.  We discussed need for golden rod to be in the home.  This can be provided by PCP.     Grief:  Patient shared her experience with son having leukemia.  Having the bone biopsy today brought up lots of emotions for patient.  Patient tearful discussing.  Leukocytosis:  Patient completed bone biopsy this am.  She will follow up with oncology later this month for results.  Palliative Care:  Explained role of community palliative care. Patient agreeable to proceed with service.  Consent has been signed.   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  She is also reports being followed by the wound clinic.  Patient has been advised to have a temporary colostomy place in order to keep wound bed clean and allow healing.  She is uncertain about this path but also reports she would like wound to heal so she is able to be up in her wheelchair.  Patient advised she misses being able to go out for shopping and dinning.  Dan reports home health nurse was seeing improvement in wound.  Resources:  Patient and SO would like to speak with Palliative Care SW to discuss further resources.  They are specifically looking at transportation options, supplies and possibly additional support in the home.   Follow up visit scheduled.     CODE STATUS: Full-desires DNR ADVANCED DIRECTIVES: Yes MOST FORM: No PPS: 30%          Lorenza Burton, RN

## 2022-06-23 ENCOUNTER — Encounter: Payer: Self-pay | Admitting: Oncology

## 2022-06-23 ENCOUNTER — Inpatient Hospital Stay: Payer: Medicare Other

## 2022-06-23 VITALS — BP 90/63 | HR 92 | Temp 96.8°F | Resp 16

## 2022-06-23 DIAGNOSIS — D72821 Monocytosis (symptomatic): Secondary | ICD-10-CM | POA: Diagnosis not present

## 2022-06-23 DIAGNOSIS — D649 Anemia, unspecified: Secondary | ICD-10-CM

## 2022-06-23 MED ORDER — SODIUM CHLORIDE 0.9 % IV SOLN
Freq: Once | INTRAVENOUS | Status: AC
  Filled 2022-06-23: qty 250

## 2022-06-23 MED ORDER — SODIUM CHLORIDE 0.9 % IV SOLN
200.0000 mg | Freq: Once | INTRAVENOUS | Status: AC
  Administered 2022-06-23: 200 mg via INTRAVENOUS
  Filled 2022-06-23: qty 200

## 2022-06-25 ENCOUNTER — Encounter (HOSPITAL_COMMUNITY): Payer: Self-pay | Admitting: Oncology

## 2022-06-29 MED FILL — Iron Sucrose Inj 20 MG/ML (Fe Equiv): INTRAVENOUS | Qty: 10 | Status: AC

## 2022-06-30 ENCOUNTER — Inpatient Hospital Stay: Payer: Medicare Other

## 2022-07-01 ENCOUNTER — Encounter: Payer: Medicare Other | Attending: Internal Medicine | Admitting: Internal Medicine

## 2022-07-01 DIAGNOSIS — Z7901 Long term (current) use of anticoagulants: Secondary | ICD-10-CM | POA: Insufficient documentation

## 2022-07-01 DIAGNOSIS — Z86711 Personal history of pulmonary embolism: Secondary | ICD-10-CM | POA: Diagnosis not present

## 2022-07-01 DIAGNOSIS — Z86718 Personal history of other venous thrombosis and embolism: Secondary | ICD-10-CM | POA: Diagnosis not present

## 2022-07-01 DIAGNOSIS — G35 Multiple sclerosis: Secondary | ICD-10-CM | POA: Diagnosis not present

## 2022-07-01 DIAGNOSIS — L89153 Pressure ulcer of sacral region, stage 3: Secondary | ICD-10-CM | POA: Diagnosis not present

## 2022-07-01 DIAGNOSIS — M4628 Osteomyelitis of vertebra, sacral and sacrococcygeal region: Secondary | ICD-10-CM | POA: Diagnosis not present

## 2022-07-01 DIAGNOSIS — L89610 Pressure ulcer of right heel, unstageable: Secondary | ICD-10-CM | POA: Diagnosis not present

## 2022-07-01 DIAGNOSIS — L89620 Pressure ulcer of left heel, unstageable: Secondary | ICD-10-CM

## 2022-07-01 DIAGNOSIS — L89154 Pressure ulcer of sacral region, stage 4: Secondary | ICD-10-CM | POA: Diagnosis not present

## 2022-07-02 ENCOUNTER — Encounter: Payer: Self-pay | Admitting: Oncology

## 2022-07-02 ENCOUNTER — Inpatient Hospital Stay: Payer: Medicare Other

## 2022-07-02 VITALS — BP 88/51 | HR 91 | Temp 98.4°F | Resp 18

## 2022-07-02 DIAGNOSIS — D72821 Monocytosis (symptomatic): Secondary | ICD-10-CM | POA: Diagnosis not present

## 2022-07-02 DIAGNOSIS — D649 Anemia, unspecified: Secondary | ICD-10-CM

## 2022-07-02 MED ORDER — SODIUM CHLORIDE 0.9 % IV SOLN
200.0000 mg | Freq: Once | INTRAVENOUS | Status: AC
  Administered 2022-07-02: 200 mg via INTRAVENOUS
  Filled 2022-07-02: qty 200

## 2022-07-02 MED ORDER — SODIUM CHLORIDE 0.9 % IV SOLN
Freq: Once | INTRAVENOUS | Status: AC
  Filled 2022-07-02: qty 250

## 2022-07-02 NOTE — Progress Notes (Addendum)
[name], [name] ([id number]) [id number] Page 1 of 10 Visit Report for 07/01/2022 Arrival Information Details Patient Name: Date of Service: [name] STRA ND, MA [name] 07/01/2022 11:15 A M Medical Record Number: [id number] Patient Account Number: [id number] Date of Birth/Sex: Treating RN: November 19, 1960 (62 y.o. [name] Primary Care Dixon Luczak: Gladstone Lighter Other Clinician: Referring Gilles Trimpe: Treating Jerold Yoss/Extender: Milda Smart Weeks in Treatment: 8 Visit Information History Since Last Visit Added or deleted any medications: No Patient Arrived: Stretcher Any new allergies or adverse reactions: No Arrival Time: 11:10 Had a fall or experienced change in No Accompanied By: caregiver activities of daily living that may affect Transfer Assistance: Manual risk of falls: Secondary Verification Process Completed: Yes Signs or symptoms of abuse/neglect since last visito No Patient Requires Transmission-Based Precautions: No Hospitalized since last visit: No Patient Has Alerts: Yes Implantable device outside of the clinic excluding No Patient Alerts: Patient on Blood Thinner cellular tissue based products placed in the center Not Diabetic since last visit: Xarelto Has Dressing in Place as Prescribed: Yes Pain Present Now: Yes Electronic Signature(s) Signed: 07/01/2022 11:27:00 AM By: Carlene Coria RN Entered By: Carlene Coria on 07/01/2022 11:27:00 -------------------------------------------------------------------------------- Clinic Level of Care Assessment Details Patient Name: Date of Service: [name] 07/01/2022 11:15 A M Medical Record Number: [id number] Patient Account Number: [id number] Date of Birth/Sex: Treating RN: [birth year] (63 y.o. [name] Primary Care Adham Johnson: Gladstone Lighter Other Clinician: Referring Denaja Verhoeven: Treating Adonia Porada/Extender: Milda Smart Weeks in Treatment: 8 Clinic Level of Care Assessment Items TOOL 4 Quantity Score []  - 0 Use when only an EandM is performed on FOLLOW-UP visit ASSESSMENTS - Nursing Assessment / Reassessment []  - 0 Reassessment of Co-morbidities (includes updates in patient status) []  - 0 Reassessment of Adherence to Treatment Plan ASSESSMENTS - Wound and Skin A ssessment / Reassessment []  - 0 Simple Wound Assessment / Reassessment - one wound []  - 0 Complex Wound Assessment / Reassessment - multiple wounds []  - 0 Dermatologic / Skin Assessment (not related to wound area) ASSESSMENTS - Focused Assessment []  - 0 Circumferential Edema Measurements - multi extremities []  - 0 Nutritional Assessment / Counseling / Intervention []  - 0 Lower Extremity Assessment (monofilament, tuning fork, pulses) []  - 0 Peripheral Arterial Disease Assessment (using hand held doppler) ASSESSMENTS - Ostomy and/or Continence Assessment and Care []  - 0 Incontinence Assessment and Management []  - 0 Ostomy Care Assessment and Management (repouching, etc.) PROCESS - Coordination of Care []  - 0 Simple Patient / Family Education for ongoing care [name], [name] ([id number]) [id number] Page 2 of 10 []  - 0 Complex (extensive) Patient / Family Education for ongoing care []  - 0 Staff obtains Programmer, systems, Records, T Results / Process Orders est []  - 0 Staff telephones HHA, Nursing Homes / Clarify orders / etc []  - 0 Routine Transfer to another Facility (non-emergent condition) []  - 0 Routine Hospital Admission (non-emergent condition) []  - 0 New Admissions / Biomedical engineer / Ordering NPWT Apligraf, etc. , []  - 0 Emergency Hospital Admission (emergent condition) []  - 0 Simple Discharge Coordination []  - 0 Complex (extensive) Discharge Coordination PROCESS - Special Needs []  - 0 Pediatric / Minor Patient Management []  - 0 Isolation Patient Management []  -  0 Hearing / Language / Visual special needs []  - 0 Assessment of Community assistance (transportation, D/C planning, etc.) []  - 0 Additional assistance / Altered mentation []  - 0 Support Surface(s) Assessment (bed, cushion, seat, etc.) INTERVENTIONS - Wound  Cleansing / Measurement []  - 0 Simple Wound Cleansing - one wound []  - 0 Complex Wound Cleansing - multiple wounds []  - 0 Wound Imaging (photographs - any number of wounds) []  - 0 Wound Tracing (instead of photographs) []  - 0 Simple Wound Measurement - one wound []  - 0 Complex Wound Measurement - multiple wounds INTERVENTIONS - Wound Dressings []  - 0 Small Wound Dressing one or multiple wounds []  - 0 Medium Wound Dressing one or multiple wounds []  - 0 Large Wound Dressing one or multiple wounds []  - 0 Application of Medications - topical []  - 0 Application of Medications - injection INTERVENTIONS - Miscellaneous []  - 0 External ear exam []  - 0 Specimen Collection (cultures, biopsies, blood, body fluids, etc.) []  - 0 Specimen(s) / Culture(s) sent or taken to Lab for analysis []  - 0 Patient Transfer (multiple staff / Civil Service fast streamer / Similar devices) []  - 0 Simple Staple / Suture removal (25 or less) []  - 0 Complex Staple / Suture removal (26 or more) []  - 0 Hypo / Hyperglycemic Management (close monitor of Blood Glucose) []  - 0 Ankle / Brachial Index (ABI) - do not check if billed separately []  - 0 Vital Signs Has the patient been seen at the hospital within the last three years: Yes Total Score: 0 Level Of Care: ____ Electronic Signature(s) Signed: 07/03/2022 11:27:58 AM By: Carlene Coria RN Entered By: Carlene Coria on 07/01/2022 11:54:50 [name] ([id number]) [id number] Page 3 of 10 -------------------------------------------------------------------------------- Encounter Discharge Information Details Patient Name: Date of Service: [name] 07/01/2022  11:15 A M Medical Record Number: [id number] Patient Account Number: [id number] Date of Birth/Sex: Treating RN: 1961/01/25 (62 y.o. [name] Primary Care Addysen Louth: Gladstone Lighter Other Clinician: Referring Journi Moffa: Treating Latria Mccarron/Extender: Milda Smart Weeks in Treatment: 8 Encounter Discharge Information Items Post Procedure Vitals Discharge Condition: Stable Temperature (F): 98.7 Ambulatory Status: Other Pulse (bpm): 100 Discharge Destination: Home Respiratory Rate (breaths/min): 18 Transportation: Private Auto Blood Pressure (mmHg): 89/54 Accompanied By: caregiver Schedule Follow-up Appointment: Yes Clinical Summary of Care: Electronic Signature(s) Signed: 07/03/2022 11:27:58 AM By: Carlene Coria RN Entered By: Carlene Coria on 07/01/2022 11:55:58 -------------------------------------------------------------------------------- Lower Extremity Assessment Details Patient Name: Date of Service: [name] 07/01/2022 11:15 A M Medical Record Number: [id number] Patient Account Number: [id number] Date of Birth/Sex: Treating RN: [birth year] (62 y.o. [name] Primary Care Braxtyn Dorff: Gladstone Lighter Other Clinician: Referring Hassan Blackshire: Treating Edilson Vital/Extender: Milda Smart Weeks in Treatment: 8 Electronic Signature(s) Signed: 07/01/2022 11:33:30 AM By: Carlene Coria RN Entered By: Carlene Coria on 07/01/2022 11:33:30 -------------------------------------------------------------------------------- Multi Wound Chart Details Patient Name: Date of Service: [name] 07/01/2022 11:15 A M Medical Record Number: [id number] Patient Account Number: [id number] Date of Birth/Sex: Treating RN: November 25, 1960 (62 y.o. [name] Primary Care Nayson Traweek: Gladstone Lighter Other Clinician: Referring Byrd Rushlow: Treating Alvey Brockel/Extender: Milda Smart Weeks in Treatment:  8 Vital Signs Height(in): Pulse(bpm): 106 Weight(lbs): Blood Pressure(mmHg): 89/54 Body Mass Index(BMI): Temperature(F): 98.2 Respiratory Rate(breaths/min): 18 [2:Photos: No Photos] [name], [name] ([id number]) [2:Left Calcaneus Wound Location: Pressure Injury Wounding Event: Pressure Ulcer Primary Etiology: Angina, Hypotension Comorbid History: 03/12/2022 Date Acquired: 8 Weeks of Treatment: Open Wound Status: No Wound Recurrence: 2.5x1.4x0.1 Measurements L x  W x D (cm) 2.749 A (cm) : rea 0.275 Volume (cm) : 70.20% % Reduction in A rea: 70.20% % Reduction in Volume: Starting Position 1 (o'clock): Ending Position 1 (o'clock):  Maximum Distance 1 (cm): No Undermining: Unstageable/Unclassified Classification:  Medium Exudate A mount: Serosanguineous Exudate Type: red, brown Exudate Color: Medium (34-66%) Granulation A mount: N/A Granulation Quality: Medium (34-66%) Necrotic A mount: Eschar, Adherent Slough Necrotic Tissue: Fat Layer (Subcutaneous Tissue): Yes  Fat Layer (Subcutaneous Tissue): Yes N/A Exposed Structures: Fascia: No Tendon: No Muscle: No Joint: No Bone: No None Epithelialization:] [3:Proximal, Midline Sacrum Pressure Injury Pressure Ulcer Angina, Hypotension 03/09/2022 8 Open No 9x5x4.2 35.343  148.44 -50.00% -5.00% 12 12 16  Yes Category/Stage IV Medium Serosanguineous red, brown Large (67-100%) Red, Hyper-granulation Small (1-33%) Adherent Slough Muscle: Yes Bone: Yes Fascia: No Tendon: No Joint: No None] [[id number]  Page 4 of 10 Right, Lateral Ankle Gradually Appeared Pressure Ulcer Angina, Hypotension 06/22/2022 0 Open No 1.5x0.5x0.1 0.589 0.059 N/A N/A No Unstageable/Unclassified None Present N/A N/A None Present (0%) N/A Large (67-100%) Eschar, Adherent Slough  None] Treatment Notes Electronic Signature(s) Signed: 07/01/2022 11:33:40 AM By: Carlene Coria RN Entered By: Carlene Coria on 07/01/2022  11:33:40 -------------------------------------------------------------------------------- [place] Details Patient Name: Date of Service: [name] 07/01/2022 11:15 A M Medical Record Number: [id number] Patient Account Number: [id number] Date of Birth/Sex: Treating RN: 1960/05/14 (62 y.o. [name] Primary Care Yuriana Gaal: Gladstone Lighter Other Clinician: Referring Daana Petrasek: Treating Marshall Roehrich/Extender: Milda Smart Weeks in Treatment: 8 Active Inactive Abuse / Safety / Falls / Self Care Management Nursing Diagnoses: Abuse or neglect; actual or potential Impaired physical mobility Goals: Patient/caregiver will verbalize understanding of skin care regimen Date Initiated: 05/06/2022 Target Resolution Date: 07/29/2022 Goal Status: Active Patient/caregiver will verbalize/demonstrate measure taken to improve self care Date Initiated: 05/06/2022 Date Inactivated: 05/20/2022 Target Resolution Date: 04/29/2022 Goal Status: Met Interventions: Assess: immobility, friction, shearing, incontinence upon admission and as needed Assess impairment of mobility on admission and as needed per policy Assess personal safety and home safety (as indicated) on admission and as needed Provide education on safe transfers Treatment Activities: Patient referred to home care : 05/06/2022 [name] ([id number]) [id number] Page 5 of 10 Notes: Wound/Skin Impairment Nursing Diagnoses: Impaired tissue integrity Knowledge deficit related to ulceration/compromised skin integrity Goals: Patient/caregiver will verbalize understanding of skin care regimen Date Initiated: 05/06/2022 Target Resolution Date: 07/29/2022 Goal Status: Active Ulcer/skin breakdown will have a volume reduction of 30% by week 4 Date Initiated: 05/06/2022 Target Resolution Date: 06/06/2022 Goal Status: Active Ulcer/skin breakdown will have a  volume reduction of 50% by week 8 Date Initiated: 05/20/2022 Target Resolution Date: 07/05/2022 Goal Status: Active Ulcer/skin breakdown will have a volume reduction of 80% by week 12 Date Initiated: 05/20/2022 Target Resolution Date: 08/05/2022 Goal Status: Active Ulcer/skin breakdown will heal within 14 weeks Date Initiated: 05/20/2022 Target Resolution Date: 09/04/2022 Goal Status: Active Interventions: Assess patient/caregiver ability to perform ulcer/skin care regimen upon admission and as needed Assess ulceration(s) every visit Provide education on ulcer and skin care Screen for HBO Treatment Activities: Skin care regimen initiated : 05/06/2022 Topical wound management initiated : 05/06/2022 Notes: Electronic Signature(s) Signed: 07/01/2022 11:35:31 AM By: Carlene Coria RN Entered By: Carlene Coria on 07/01/2022 11:35:30 -------------------------------------------------------------------------------- Pain Assessment Details Patient Name: Date of Service: [name] 07/01/2022 11:15 A M Medical Record Number: [id number] Patient Account Number: [id number] Date of Birth/Sex: Treating RN: 1960/05/27 (63 y.o. [name] Primary Care Jule Whitsel: Gladstone Lighter Other Clinician: Referring Sachi Boulay: Treating Mandeep Kiser/Extender: Milda Smart Weeks in Treatment: 8 Active Problems Location of Pain Severity and Description of Pain Patient Has  Paino No Site Locations [name], [name] ([id number]) [id number] Page 6 of 10 Pain Management and Medication Current Pain Management: Electronic Signature(s) Signed: 07/01/2022 11:27:37 AM By: Carlene Coria RN Entered By: Carlene Coria on 07/01/2022 11:27:37 -------------------------------------------------------------------------------- Patient/Caregiver Education Details Patient Name: Date of Service: [name] 3/20/2024andnbsp11:15 [place] Record Number:  [id number] Patient Account Number: [id number] Date of Birth/Gender: Treating RN: [birth year] (62 y.o. [name] Primary Care Physician: Gladstone Lighter Other Clinician: Referring Physician: Treating Physician/Extender: Milda Smart Weeks in Treatment: 8 Education Assessment Education Provided To: Patient Education Topics Provided Pressure: Methods: Explain/Verbal Responses: State content correctly Electronic Signature(s) Signed: 07/03/2022 11:27:58 AM By: Carlene Coria RN Entered By: Carlene Coria on 07/01/2022 11:35:46 -------------------------------------------------------------------------------- Wound Assessment Details Patient Name: Date of Service: [name] 07/01/2022 11:15 A M Medical Record Number: [id number] Patient Account Number: [id number] Date of Birth/Sex: Treating RN: Feb 28, 1961 (62 y.o. [name] Primary Care Kenichi Cassada: Gladstone Lighter Other Clinician: Referring Aadarsh Cozort: Treating Tayshawn Purnell/Extender: Milda Smart Weeks in Treatment: 8 Wound Status Wound Number: 2 Primary Etiology: Pressure Ulcer Wound Location: Left Calcaneus Wound Status: Open Wounding Event: Pressure Injury Comorbid History: Angina, Hypotension Date Acquired: 03/12/2022 Weeks Of Treatment: 8 Clustered Wound: No Photos Wound Measurements AIANNA, MERKLINGER ([id number]) Length: (cm) 2.5 Width: (cm) 1.4 Depth: (cm) 0.1 Area: (cm) 2.749 Volume: (cm) 0.275 [id number] Page 7 of 10 % Reduction in Area: 70.2% % Reduction in Volume: 70.2% Epithelialization: None Tunneling: No Undermining: No Wound Description Classification: Unstageable/Unclassified Exudate Amount: Medium Exudate Type: Serosanguineous Exudate Color: red, brown Foul Odor After Cleansing: No Slough/Fibrino Yes Wound Bed Granulation Amount: Medium (34-66%) Exposed Structure Necrotic Amount: Medium  (34-66%) Fascia Exposed: No Necrotic Quality: Eschar, Adherent Slough Fat Layer (Subcutaneous Tissue) Exposed: Yes Tendon Exposed: No Muscle Exposed: No Joint Exposed: No Bone Exposed: No Treatment Notes Wound #2 (Calcaneus) Wound Laterality: Left Cleanser Soap and Water Discharge Instruction: Gently cleanse wound with antibacterial soap, rinse and pat dry prior to dressing wounds Wound Cleanser Discharge Instruction: Wash your hands with soap and water. Remove old dressing, discard into plastic bag and place into trash. Cleanse the wound with Wound Cleanser prior to applying a clean dressing using gauze sponges, not tissues or cotton balls. Do not scrub or use excessive force. Pat dry using gauze sponges, not tissue or cotton balls. Peri-Wound Care Topical Activon Honey Gel, 25 (g) Tube Primary Dressing Hydrofera Blue Ready Transfer Foam, 2.5x2.5 (in/in) Discharge Instruction: Apply Hydrofera Blue Ready to wound bed as directed Secondary Dressing Kerlix 4.5 x 4.1 (in/yd) Discharge Instruction: Apply Kerlix 4.5 x 4.1 (in/yd) as instructed Secured With Medipore T - 16M Medipore H Soft Cloth Surgical T ape ape, 2x2 (in/yd) Compression Wrap Compression Stockings Add-Ons ALLEVYN Heel 4 1/2in x 5 1/2in / 10.5cm x 13.5cm Discharge Instruction: Apply the white, patterned surface to the heel. Electronic Signature(s) Signed: 07/01/2022 11:34:16 AM By: Carlene Coria RN Previous Signature: 07/01/2022 11:29:21 AM Version By: Carlene Coria RN Entered By: Carlene Coria on 07/01/2022 11:34:15 -------------------------------------------------------------------------------- Wound Assessment Details Patient Name: Date of Service: [name] 07/01/2022 11:15 A M Medical Record Number: [id number] Patient Account Number: [id number] Date of Birth/Sex: Treating RN: 1960/08/14 (62 y.o. [name] Primary Care Calum Cormier: Gladstone Lighter Other Clinician: Referring  Yedidya Duddy: Treating Izayah Miner/Extender: Milda Smart Weeks in Treatment: 8 Wound Status [name] ([id number]) [id number] Page 8 of 10 Wound Number: 3 Primary Etiology: Pressure Ulcer Wound  Location: Proximal, Midline Sacrum Wound Status: Open Wounding Event: Pressure Injury Comorbid History: Angina, Hypotension Date Acquired: 03/09/2022 Weeks Of Treatment: 8 Clustered Wound: No Photos Wound Measurements Length: (cm) 9 Width: (cm) 5 Depth: (cm) 4.2 Area: (cm) 35.343 Volume: (cm) 148.44 % Reduction in Area: -50% % Reduction in Volume: -5% Epithelialization: None Tunneling: No Undermining: Yes Starting Position (o'clock): 12 Ending Position (o'clock): 12 Maximum Distance: (cm) 16 Wound Description Classification: Category/Stage IV Exudate Amount: Medium Exudate Type: Serosanguineous Exudate Color: red, brown Foul Odor After Cleansing: No Slough/Fibrino Yes Wound Bed Granulation Amount: Large (67-100%) Exposed Structure Granulation Quality: Red, Hyper-granulation Fascia Exposed: No Necrotic Amount: Small (1-33%) Fat Layer (Subcutaneous Tissue) Exposed: Yes Necrotic Quality: Adherent Slough Tendon Exposed: No Muscle Exposed: Yes Necrosis of Muscle: Yes Joint Exposed: No Bone Exposed: Yes Treatment Notes Wound #3 (Sacrum) Wound Laterality: Midline, Proximal Cleanser Soap and Water Discharge Instruction: Gently cleanse wound with antibacterial soap, rinse and pat dry prior to dressing wounds Wound Cleanser Discharge Instruction: Wash your hands with soap and water. Remove old dressing, discard into plastic bag and place into trash. Cleanse the wound with Wound Cleanser prior to applying a clean dressing using gauze sponges, not tissues or cotton balls. Do not scrub or use excessive force. Pat dry using gauze sponges, not tissue or cotton balls. Peri-Wound Care Topical Primary Dressing Secondary  Dressing Conforming [place] Roll-Medium Discharge Instruction: Pack lightly with rolled gauze soaked in Dakins solution. Secured With Bordered foam dressing. Compression Wrap Compression Stockings [name], [name] ([id number]) [id number] Page 9 of 10 Add-Ons Electronic Signature(s) Signed: 07/01/2022 11:30:15 AM By: Carlene Coria RN Entered By: Carlene Coria on 07/01/2022 11:30:14 -------------------------------------------------------------------------------- Wound Assessment Details Patient Name: Date of Service: [name] 07/01/2022 11:15 A M Medical Record Number: [id number] Patient Account Number: [id number] Date of Birth/Sex: Treating RN: 05/21/1960 (62 y.o. [name] Primary Care Jarick Harkins: Gladstone Lighter Other Clinician: Referring Keyuna Cuthrell: Treating Cambell Rickenbach/Extender: Milda Smart Weeks in Treatment: 8 Wound Status Wound Number: 5 Primary Etiology: Pressure Ulcer Wound Location: Right, Lateral Ankle Wound Status: Open Wounding Event: Gradually Appeared Comorbid History: Angina, Hypotension Date Acquired: 06/22/2022 Weeks Of Treatment: 0 Clustered Wound: No Photos Wound Measurements Length: (cm) 1.5 Width: (cm) 0.5 Depth: (cm) 0.1 Area: (cm) 0.589 Volume: (cm) 0.059 % Reduction in Area: % Reduction in Volume: Epithelialization: None Tunneling: No Undermining: No Wound Description Classification: Unstageable/Unclassified Exudate Amount: None Present Foul Odor After Cleansing: No Slough/Fibrino Yes Wound Bed Granulation Amount: None Present (0%) Necrotic Amount: Large (67-100%) Necrotic Quality: Eschar, Adherent Slough Treatment Notes Wound #5 (Ankle) Wound Laterality: Right, Lateral Cleanser Peri-Wound Care Topical Primary Dressing Secondary Dressing Secured With Compression [name], Aggie Moats ([id number]) [id number] Page 10 of  10 Compression Stockings Add-Ons Electronic Signature(s) Signed: 07/01/2022 11:31:55 AM By: Carlene Coria RN Entered By: Carlene Coria on 07/01/2022 11:31:55 -------------------------------------------------------------------------------- Vitals Details Patient Name: Date of Service: [name] 07/01/2022 11:15 A M Medical Record Number: [id number] Patient Account Number: [id number] Date of Birth/Sex: Treating RN: 15-Nov-1960 (62 y.o. [name] Primary Care Mykel Sponaugle: Gladstone Lighter Other Clinician: Referring Kyndra Condron: Treating Tanyla Stege/Extender: Milda Smart Weeks in Treatment: 8 Vital Signs Time Taken: 11:17 Temperature (F): 98.2 Pulse (bpm): 106 Respiratory Rate (breaths/min): 18 Blood Pressure (mmHg): 89/54 Reference Range: 80 - 120 mg / dl Electronic Signature(s) Signed: 07/01/2022 11:27:28 AM By: Carlene Coria RN Entered By: Carlene Coria on 07/01/2022 11:27:28

## 2022-07-02 NOTE — Patient Instructions (Signed)

## 2022-07-03 ENCOUNTER — Inpatient Hospital Stay: Payer: Medicare Other

## 2022-07-03 ENCOUNTER — Inpatient Hospital Stay (HOSPITAL_BASED_OUTPATIENT_CLINIC_OR_DEPARTMENT_OTHER): Payer: Medicare Other | Admitting: Oncology

## 2022-07-03 ENCOUNTER — Encounter: Payer: Self-pay | Admitting: Oncology

## 2022-07-03 VITALS — BP 90/57 | HR 98 | Temp 96.4°F | Resp 18

## 2022-07-03 DIAGNOSIS — I2699 Other pulmonary embolism without acute cor pulmonale: Secondary | ICD-10-CM

## 2022-07-03 DIAGNOSIS — D72829 Elevated white blood cell count, unspecified: Secondary | ICD-10-CM | POA: Diagnosis not present

## 2022-07-03 DIAGNOSIS — D500 Iron deficiency anemia secondary to blood loss (chronic): Secondary | ICD-10-CM | POA: Diagnosis not present

## 2022-07-03 DIAGNOSIS — D72821 Monocytosis (symptomatic): Secondary | ICD-10-CM | POA: Diagnosis not present

## 2022-07-03 DIAGNOSIS — D649 Anemia, unspecified: Secondary | ICD-10-CM

## 2022-07-03 DIAGNOSIS — L89154 Pressure ulcer of sacral region, stage 4: Secondary | ICD-10-CM | POA: Diagnosis not present

## 2022-07-03 LAB — CBC WITH DIFFERENTIAL (CANCER CENTER ONLY)
Abs Immature Granulocytes: 1.53 10*3/uL — ABNORMAL HIGH (ref 0.00–0.07)
Basophils Absolute: 0.1 10*3/uL (ref 0.0–0.1)
Basophils Relative: 0 %
Eosinophils Absolute: 0.1 10*3/uL (ref 0.0–0.5)
Eosinophils Relative: 1 %
HCT: 28.6 % — ABNORMAL LOW (ref 36.0–46.0)
Hemoglobin: 8.3 g/dL — ABNORMAL LOW (ref 12.0–15.0)
Immature Granulocytes: 12 %
Lymphocytes Relative: 6 %
Lymphs Abs: 0.8 10*3/uL (ref 0.7–4.0)
MCH: 23.5 pg — ABNORMAL LOW (ref 26.0–34.0)
MCHC: 29 g/dL — ABNORMAL LOW (ref 30.0–36.0)
MCV: 81 fL (ref 80.0–100.0)
Monocytes Absolute: 1.8 10*3/uL — ABNORMAL HIGH (ref 0.1–1.0)
Monocytes Relative: 15 %
Neutro Abs: 8 10*3/uL — ABNORMAL HIGH (ref 1.7–7.7)
Neutrophils Relative %: 66 %
Platelet Count: 426 10*3/uL — ABNORMAL HIGH (ref 150–400)
RBC: 3.53 MIL/uL — ABNORMAL LOW (ref 3.87–5.11)
RDW: 21.3 % — ABNORMAL HIGH (ref 11.5–15.5)
Smear Review: INCREASED
WBC Count: 12.3 10*3/uL — ABNORMAL HIGH (ref 4.0–10.5)
nRBC: 0 % (ref 0.0–0.2)

## 2022-07-03 LAB — SAMPLE TO BLOOD BANK

## 2022-07-03 NOTE — Assessment & Plan Note (Signed)
Continue Xarelto 

## 2022-07-03 NOTE — Assessment & Plan Note (Signed)
Continue follow-up with Dr. Steva Ready.

## 2022-07-03 NOTE — Assessment & Plan Note (Signed)
Chronic leukocytosis  Previous work up  JAK2 V617F mutation negative, with reflex to other mutations CALR, MPL, JAK 2 Ex 12-15 mutations negative. EBV negative.  CMV negative, BCR-ABL FISH negative.  Peripheral blood flow cytometry showed monocytosis 17% of the leukocytes, with immunophenotypic aberrancies-CD56 in a subset and down-regulation of CD13.  A nonspecific finding that can be seen in association with reactive/active processes as well as neoplastic processes.  No immature B cells detected in the specimen.  Bone marrow biopsy results were reviewed and discussed with patient. Hypercellular bone marrow for age with trilineage hematopoiesis, The overall myeloid findings are not considered entirely specific or  diagnostic of a neoplastic process and may be secondary in nature due to infection, immune mediated process, medication, etc.  Nonetheless,  correlation with cytogenetic studies is recommended. The bone marrow  also shows slight polyclonal plasmacytosis most consist with a reactive  process. -Cytogenetic is normal.  I discussed with pathology Dr.Smir regarding patient's case.  Her bone marrow findings are not diagnostic for neoplastic process.  Will check peripheral blood intelligen myeloid panel.

## 2022-07-03 NOTE — Assessment & Plan Note (Addendum)
Iron panel today showed decreased iron saturation. Decreased retic panel  Ferritin is elevated, likely falsely elevated due to infection.  Patient has received IV Venofer treatments empirically.  Hemoglobin remains low. Bone marrow showed abundant iron store.  Hold off additional iron treatment. Possible anemia due to chronic inflammation.

## 2022-07-03 NOTE — Progress Notes (Addendum)
Hematology/Oncology Progress note [phone] [phone]       CHIEF COMPLAINTS/PURPOSE OF CONSULTATION:  Leukocytosis  ASSESSMENT & PLAN:   Leukocytosis Chronic leukocytosis  Previous work up  JAK2 V617F mutation negative, with reflex to other mutations CALR, MPL, JAK 2 Ex 12-15 mutations negative. EBV negative.  CMV negative, BCR-ABL FISH negative.  Peripheral blood flow cytometry showed monocytosis 17% of the leukocytes, with immunophenotypic aberrancies-CD56 in a subset and down-regulation of CD13.  A nonspecific finding that can be seen in association with reactive/active processes as well as neoplastic processes.  No immature B cells detected in the specimen.  Bone marrow biopsy results were reviewed and discussed with patient. Hypercellular bone marrow for age with trilineage hematopoiesis, The overall myeloid findings are not considered entirely specific or  diagnostic of a neoplastic process and may be secondary in nature due to infection, immune mediated process, medication, etc.  Nonetheless,  correlation with cytogenetic studies is recommended. The bone marrow  also shows slight polyclonal plasmacytosis most consist with a reactive  process. -Cytogenetic is normal.  I discussed with pathology Dr.Smir regarding patient's case.  Her bone marrow findings are not diagnostic for neoplastic process.  Will check peripheral blood intelligen myeloid panel.   Decubitus ulcer of sacral region, stage 4 (HCC) Continue follow-up with Dr. Steva Ready.  Bilateral pulmonary embolism (HCC) Continue Xarelto   Anemia Iron panel today showed decreased iron saturation. Decreased retic panel  Ferritin is elevated, likely falsely elevated due to infection.  Patient has received IV Venofer treatments empirically.  Hemoglobin remains low. Bone marrow showed abundant iron store.  Hold off additional iron treatment. Possible anemia due to chronic inflammation.   Orders  Placed This Encounter  Procedures   IntelliGEN Myeloid    Standing Status:   Future    Number of Occurrences:   1    Standing Expiration Date:   07/03/2023   Follow up  to be determined All questions were answered. The patient knows to call the clinic with any problems, questions or concerns.  Earlie Server, MD, PhD [facility] Hematology Oncology 07/03/2022    HISTORY OF PRESENTING ILLNESS:  Katheren Mowell 62 y.o. female presents to for post hospitalization follow up for leukocytosis Patient has no showed two times since her discharge.  She is accompanied by her boyfriend.  +Stage IV  Decubitus ulcer,acute osteomyelitis  recently seen by ID, on antibiotics.  Chronic fatigue unchanged. She is Xarelto for PE.  Status post IV Venofer weekly x 3.   MEDICAL HISTORY:  Past Medical History:  Diagnosis Date   Bilateral pulmonary embolism (HCC)    Chronic constipation    Family history of pancreatic cancer    Frequent falls    GERD (gastroesophageal reflux disease)    Hiatal hernia    Multiple sclerosis (HCC)    Pulmonary hemorrhage    RLS (restless legs syndrome)    Sepsis due to pneumonia ([place])    Tachycardia     SURGICAL HISTORY: Past Surgical History:  Procedure Laterality Date   ESOPHAGOGASTRODUODENOSCOPY (EGD) WITH PROPOFOL N/A 09/11/2021   Procedure: ESOPHAGOGASTRODUODENOSCOPY (EGD) WITH PROPOFOL;  Surgeon: Annamaria Helling, DO;  Location: [facility];  Service: Gastroenterology;  Laterality: N/A;   PULMONARY THROMBECTOMY Bilateral 02/25/2022   Procedure: PULMONARY THROMBECTOMY;  Surgeon: Algernon Huxley, MD;  Location: [facility];  Service: Cardiovascular;  Laterality: Bilateral;   TUBAL LIGATION      SOCIAL HISTORY: Social History   Socioeconomic History   Marital status:  Widowed    Spouse name: Not on file   Number of children: Not on file   Years of education: Not on file   Highest education level: Not on file  Occupational History   Not on  file  Tobacco Use   Smoking status: Never   Smokeless tobacco: Never  Vaping Use   Vaping Use: Never used  Substance and Sexual Activity   Alcohol use: Never   Drug use: Never   Sexual activity: Not Currently  Other Topics Concern   Not on file  Social History Narrative   Not on file   Social Determinants of Health   Financial Resource Strain: Not on file  Food Insecurity: No Food Insecurity (04/17/2022)   Hunger Vital Sign    Worried About Running Out of Food in the Last Year: Never true    Ran Out of Food in the Last Year: Never true  Transportation Needs: No Transportation Needs (04/17/2022)   PRAPARE - Hydrologist (Medical): No    Lack of Transportation (Non-Medical): No  Physical Activity: Not on file  Stress: Not on file  Social Connections: Not on file  Intimate Partner Violence: Not At Risk (04/17/2022)   Humiliation, Afraid, Rape, and Kick questionnaire    Fear of Current or Ex-Partner: No    Emotionally Abused: No    Physically Abused: No    Sexually Abused: No    FAMILY HISTORY: History reviewed. No pertinent family history.  ALLERGIES:  is allergic to erythromycin and lexapro [escitalopram oxalate].  MEDICATIONS:  Current Outpatient Medications  Medication Sig Dispense Refill   ascorbic acid (VITAMIN C) 500 MG tablet Take 500 mg by mouth daily.     Cholecalciferol 50 MCG (2000 UT) CAPS Take 2,000 Units by mouth daily.     collagenase (SANTYL) 250 UNIT/GM ointment Apply 1 Application topically daily.     cyanocobalamin 1000 MCG tablet Place 1 tablet (1,000 mcg total) into feeding tube daily.     cyclobenzaprine (FLEXERIL) 10 MG tablet Take 10 mg by mouth 2 (two) times daily as needed.     dalfampridine 10 MG TB12 Take 1 tablet by mouth every 12 (twelve) hours.     DULoxetine (CYMBALTA) 30 MG capsule Take 30 mg by mouth daily.     gabapentin (NEURONTIN) 800 MG tablet Take 400-800 mg by mouth 2 (two) times daily.      HYDROcodone-acetaminophen (NORCO/VICODIN) 5-325 MG tablet Take 1 tablet by mouth every 6 (six) hours as needed for moderate pain.     imipramine (TOFRANIL) 25 MG tablet Take 25 mg by mouth at bedtime.     levofloxacin (LEVAQUIN) 750 MG tablet Take 1 tablet (750 mg total) by mouth daily. 30 tablet 1   midodrine (PROAMATINE) 5 MG tablet Take 1 tablet (5 mg total) by mouth 2 (two) times daily with a meal. 14 tablet 0   omeprazole (PRILOSEC) 20 MG capsule Take 20 mg by mouth daily.     pregabalin (LYRICA) 75 MG capsule Take 75 mg by mouth 3 (three) times daily.     propranolol (INDERAL) 20 MG tablet Take 20 mg by mouth 2 (two) times daily.     rivaroxaban (XARELTO) 20 MG TABS tablet Take 1 tablet (20 mg total) by mouth daily with supper. 30 tablet 0   rOPINIRole (REQUIP) 0.5 MG tablet Take 0.5 mg by mouth at bedtime.     tolterodine (DETROL LA) 2 MG 24 hr capsule Take 2 mg by  mouth daily.     traMADol (ULTRAM) 50 MG tablet Take 50 mg by mouth 2 (two) times daily.     traZODone (DESYREL) 50 MG tablet Take 50 mg by mouth at bedtime.     No current facility-administered medications for this visit.    Review of Systems  Constitutional:  Positive for appetite change and fatigue. Negative for chills and fever.  HENT:   Negative for hearing loss and voice change.   Eyes:  Negative for eye problems.  Respiratory:  Negative for chest tightness, cough and shortness of breath.   Cardiovascular:  Negative for chest pain.  Gastrointestinal:  Negative for abdominal distention, abdominal pain and blood in stool.  Endocrine: Negative for hot flashes.  Genitourinary:  Negative for difficulty urinating and frequency.   Musculoskeletal:  Negative for arthralgias and back pain.  Skin:  Negative for itching and rash.       Sacrum ulcer  Neurological:  Negative for extremity weakness.  Hematological:  Negative for adenopathy.  Psychiatric/Behavioral:  Negative for confusion.      PHYSICAL EXAMINATION: ECOG  PERFORMANCE STATUS: 2 - Symptomatic, <50% confined to bed  Vitals:   07/03/22 1106  BP: (!) 90/57  Pulse: 98  Resp: 18  Temp: (!) 96.4 F (35.8 C)   There were no vitals filed for this visit.  Physical Exam Constitutional:      General: She is not in acute distress.    Appearance: She is ill-appearing.  HENT:     Head: Normocephalic and atraumatic.  Eyes:     General: No scleral icterus. Cardiovascular:     Rate and Rhythm: Normal rate.     Heart sounds: No murmur heard. Pulmonary:     Effort: Pulmonary effort is normal. No respiratory distress.  Abdominal:     General: There is no distension.  Musculoskeletal:     Cervical back: Normal range of motion and neck supple.  Skin:    Coloration: Skin is pale.  Neurological:     Mental Status: She is alert and oriented to person, place, and time. Mental status is at baseline.     Motor: No abnormal muscle tone.  Psychiatric:        Mood and Affect: Affect normal.      LABORATORY DATA:  I have reviewed the data as listed    Latest Ref Rng & Units 07/03/2022   11:45 AM 06/17/2022    9:17 AM 06/05/2022   10:56 AM  CBC  WBC 4.0 - 10.5 K/uL 12.3  17.3  17.4   Hemoglobin 12.0 - 15.0 g/dL 8.3  9.2  7.3   Hematocrit 36.0 - 46.0 % 28.6  31.8  24.6   Platelets 150 - 400 K/uL 426  493  393       Latest Ref Rng & Units 05/28/2022   12:00 PM 04/21/2022    6:18 AM 04/20/2022    5:07 AM  CMP  Glucose 70 - 99 mg/dL 105   99   BUN 6 - 20 mg/dL 11   8   Creatinine 0.44 - 1.00 mg/dL 0.46  0.43  0.46   Sodium 135 - 145 mmol/L 130   137   Potassium 3.5 - 5.1 mmol/L 3.5  4.4  3.4   Chloride 98 - 111 mmol/L 95   104   CO2 22 - 32 mmol/L 23   24   Calcium 8.9 - 10.3 mg/dL 8.5   8.2   Total Protein 6.5 - 8.1  g/dL 5.8     Total Bilirubin 0.3 - 1.2 mg/dL 0.5     Alkaline Phos 38 - 126 U/L 298     AST 15 - 41 U/L 32     ALT 0 - 44 U/L 17        RADIOGRAPHIC STUDIES: I have personally reviewed the radiological images as listed and  agreed with the findings in the report. CT BONE MARROW BIOPSY & ASPIRATION  Result Date: 06/17/2022 CLINICAL DATA:  Leukocytosis and need for bone marrow biopsy. EXAM: CT GUIDED BONE MARROW ASPIRATION AND BIOPSY ANESTHESIA/SEDATION: Moderate (conscious) sedation was employed during this procedure. A total of Versed 1.0 mg and Fentanyl 75 mcg was administered intravenously by radiology nursing. Moderate Sedation Time: 14 minutes. The patient's level of consciousness and vital signs were monitored continuously by radiology nursing throughout the procedure under my direct supervision. PROCEDURE: The procedure risks, benefits, and alternatives were explained to the patient. Questions regarding the procedure were encouraged and answered. The patient understands and consents to the procedure. A time out was performed prior to initiating the procedure. The right gluteal region was prepped with chlorhexidine. Sterile gown and sterile gloves were used for the procedure. Local anesthesia was provided with 1% Lidocaine. Under CT guidance, an 11 gauge On Control bone cutting needle was advanced from a posterior approach into the right iliac bone. Needle positioning was confirmed with CT. Initial non heparinized and heparinized aspirate samples were obtained of bone marrow. Core biopsy was performed via the On Control drill needle. COMPLICATIONS: None FINDINGS: Inspection of initial aspirate did reveal visible particles. Intact core biopsy sample was obtained. IMPRESSION: CT guided bone marrow biopsy of right posterior iliac bone with both aspirate and core samples obtained. Electronically Signed   By: Aletta Edouard M.D.   On: 06/17/2022 10:55

## 2022-07-04 NOTE — Progress Notes (Signed)
BISMA, HAINS ([id number]) [id number] Page 1 of 9 Visit Report for 07/01/2022 Chief Complaint Document Details Patient Name: Date of Service: [name] STRA [name], [name] 07/01/2022 11:15 A M Medical Record Number: [id number] Patient Account Number: [id number] Date of Birth/Sex: Treating RN: Aug 01, 1960 (62 y.o. [name] Primary Care Provider: Gladstone Lighter Other Clinician: Referring Provider: Treating Provider/Extender: Milda Smart Weeks in Treatment: 8 Information Obtained from: Patient Chief Complaint 05/06/2022; Bilateral feet wounds and sacral wounds Electronic Signature(s) Signed: 07/01/2022 2:38:02 PM By: Kalman Shan DO Entered By: Kalman Shan on 07/01/2022 12:29:32 -------------------------------------------------------------------------------- Debridement Details Patient Name: Date of Service: [name] Deed [name], [name] 07/01/2022 11:15 A M Medical Record Number: [id number] Patient Account Number: [id number] Date of Birth/Sex: Treating RN: [birth year] (62 y.o. [name] Primary Care Provider: Gladstone Lighter Other Clinician: Referring Provider: Treating Provider/Extender: Milda Smart Weeks in Treatment: 8 Debridement Performed for Assessment: Wound #2 Left Calcaneus Performed By: Physician Kalman Shan, MD Debridement Type: Debridement Level of Consciousness (Pre-procedure): Awake and Alert Pre-procedure Verification/Time Out Yes - 11:52 Taken: Start Time: 11:52 T Area Debrided (L x W): otal 2.5 (cm) x 1.4 (cm) = 3.5 (cm) Tissue and other material debrided: Viable, Non-Viable, Eschar, Slough, Subcutaneous, Slough Level: Skin/Subcutaneous Tissue Debridement Description: Excisional Instrument: Curette Bleeding: Minimum Hemostasis Achieved: Pressure End Time: 11:55 Procedural Pain: 0 Post Procedural Pain: 0 Response to Treatment: Procedure was tolerated  well Level of Consciousness (Post- Awake and Alert procedure): Post Debridement Measurements of Total Wound Length: (cm) 2.5 Stage: Unstageable/Unclassified Width: (cm) 1.4 Depth: (cm) 0.1 Volume: (cm) 0.275 Character of Wound/Ulcer Post Debridement: Improved Post Procedure Diagnosis Same as Pre-procedure Electronic Signature(s) Signed: 07/01/2022 2:38:02 PM By: Kalman Shan DO Signed: 07/03/2022 11:27:58 AM By: Carlene Coria RN Entered By: Carlene Coria on 07/01/2022 11:53:45 [name] ([id number]) [id number] Page 2 of 9 -------------------------------------------------------------------------------- Debridement Details Patient Name: Date of Service: [name] Deed [name], [name] 07/01/2022 11:15 A M Medical Record Number: [id number] Patient Account Number: [id number] Date of Birth/Sex: Treating RN: Nov 17, 1960 (62 y.o. [name] Primary Care Provider: Gladstone Lighter Other Clinician: Referring Provider: Treating Provider/Extender: Milda Smart Weeks in Treatment: 8 Debridement Performed for Assessment: Wound #5 Right,Lateral Ankle Performed By: Physician Kalman Shan, MD Debridement Type: Debridement Level of Consciousness (Pre-procedure): Awake and Alert Pre-procedure Verification/Time Out Yes - 11:52 Taken: Start Time: 11:52 T Area Debrided (L x W): otal 1.5 (cm) x 0.5 (cm) = 0.75 (cm) Tissue and other material debrided: Viable, Non-Viable, Eschar Level: Non-Viable Tissue Debridement Description: Selective/Open Wound Instrument: Curette Bleeding: Minimum Hemostasis Achieved: Pressure End Time: 11:55 Procedural Pain: 0 Post Procedural Pain: 0 Response to Treatment: Procedure was tolerated well Level of Consciousness (Post- Awake and Alert procedure): Post Debridement Measurements of Total Wound Length: (cm) 1.5 Stage: Unstageable/Unclassified Width: (cm) 0.5 Depth: (cm) 0.1 Volume: (cm)  0.059 Character of Wound/Ulcer Post Debridement: Improved Post Procedure Diagnosis Same as Pre-procedure Electronic Signature(s) Signed: 07/01/2022 2:38:02 PM By: Kalman Shan DO Signed: 07/03/2022 11:27:58 AM By: Carlene Coria RN Entered By: Kalman Shan on 07/01/2022 12:41:26 -------------------------------------------------------------------------------- HPI Details Patient Name: Date of Service: [name] Deed [name], [name] 07/01/2022 11:15 A M Medical Record Number: [id number] Patient Account Number: [id number] Date of Birth/Sex: Treating RN: [birth year] (62 y.o. [name] Primary Care Provider: Gladstone Lighter Other Clinician: Referring Provider: Treating Provider/Extender: Milda Smart Weeks in Treatment: 8 History of Present Illness HPI Description: 05/06/2022 [name] is  a 62 year old female with a past medical history of multiple sclerosis and PEs and DVT that presents to the clinic for sacral s wounds and Bilateral feet wounds. Patient states that she obtained the sacral ulcers when she was hospitalized in November 2023 for bilateral pulmonary embolisms. She was in the ICU on a ventilator. Since discharge she has been using Dakin's wet-to-dry dressings to the area. Since her hospitalization she has been bedbound. However she is doing physical therapy. Prior to being hospitalized she was able to pull herself up and transfer from bed to chair on her own. Due to her MS she is not fully mobile. Unfortunately she states that the wound has gotten larger on her sacrum despite Wound care. Patient has home health. On 04/16/2022 she was admitted to the hospital for severe sepsis secondary to acute UTI and sacral cellulitis. She completed 5 days of IV cefepime and vancomycin. She was not discharged with oral antibiotics. She states she developed the bilateral feet wounds at that time. She states she has bunny boots however she is not wearing  them. She currently denies systemic signs of infection. 2/7; patient presents for follow-up. She had a bone biopsy done at last clinic visit that showed fragments of inflamed granulation tissue, necrotic material and bone with acute osteomyelitis. Culture grew Proteus mirabilis and Klebsiella pneumoniae. Patient is scheduled to see infectious disease next week. For now she has been doing Dakin's wet-to-dry dressings to the sacrum. She is not able to obtain Santyl and has been keeping the left heel covered. T the right foot where o there was a previous wound with scab however this has healed. This has remained closed. No open wound noted. She states over the past week she has had chills but no fevers, nausea/vomiting or purulent drainage. 2/21; patient presents for follow-up. She saw Dr. Steva Ready on 2/15. 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 Today she had feces COURNEY, ROSENKRANTZ ([id number]) [id number] Page 3 of 9 impacted in the wound and throughout the tunneling. We discussed a diverting colostomy to address this issue. She was agreeable with a referral to general surgery. She currently denies systemic signs of infection. She has been using Medihoney and Hydrofera Blue to the left heel wound. She has been using her Prevalon boots. 3/20; patient presents for follow-up. She has been using Dakin's wet-to-dry dressings to the sacral wound. She has developed a new wound to her right lateral heel. She is using Medihoney and Hydrofera Blue to the left heel. She is currently taking Levaquin to complete 6 weeks. She follows with infectious disease for this. She saw general surgery at [facility] to discuss potentially doing a diverting colostomy however per patient's surgeon did not recommend this. Electronic Signature(s) Signed: 07/01/2022 2:38:02 PM By: Kalman Shan DO Entered By: Kalman Shan on 07/01/2022  12:31:29 -------------------------------------------------------------------------------- Physical Exam Details Patient Name: Date of Service: [name] Deed [name], [name] 07/01/2022 11:15 A M Medical Record Number: [id number] Patient Account Number: [id number] Date of Birth/Sex: Treating RN: 1960-04-21 (62 y.o. [name] Primary Care Provider: Gladstone Lighter Other Clinician: Referring Provider: Treating Provider/Extender: Milda Smart Weeks in Treatment: 8 Constitutional . Cardiovascular . Psychiatric . Notes Left heel: Eschar Right foot: T the lateral malleolus there is an open wound with eschar. Sacrum: Extensive large wound that tunnels and undermines with Pale o granulation tissue. No longer has exposed bone. Electronic Signature(s) Signed: 07/01/2022 2:38:02 PM By: Kalman Shan DO Entered By: Kalman Shan on  07/01/2022 12:33:05 -------------------------------------------------------------------------------- Physician Orders Details Patient Name: Date of Service: [name] Deed [name], [name] 07/01/2022 11:15 A M Medical Record Number: [id number] Patient Account Number: [id number] Date of Birth/Sex: Treating RN: [birth year] (62 y.o. [name] Primary Care Provider: Gladstone Lighter Other Clinician: Referring Provider: Treating Provider/Extender: Milda Smart Weeks in Treatment: 8 Verbal / Phone Orders: No Diagnosis Coding Follow-up Appointments Return Appointment in [place]: - [facility] [phone] [place] for wound care. May utilize formulary equivalent dressing for wound treatment orders unless otherwise specified. Home Health Nurse may visit PRN to address patients wound care needs. Scheduled days for dressing changes to be completed; exception, patient has scheduled wound care visit that day. **Please direct any NON-WOUND related issues/requests for  orders to patient's Primary Care Physician. **If current dressing causes regression in wound condition, may D/C ordered dressing product/s and apply Normal Saline Moist Dressing daily until next [place] or Other MD appointment. **Notify [facility] of regression in wound condition at [phone]. Bathing/ Shower/ Hygiene May shower; gently cleanse wound with antibacterial soap, rinse and pat dry prior to dressing wounds No tub bath. Anesthetic (Use 'Patient Medications' Section for Anesthetic Order Entry) Lidocaine applied to wound bed Off-Loading SHARYN, SHAY ([id number]) [id number] Page 4 of 9 Roho cushion for wheelchair Hospital bed/mattress A fluidized (Group 3) ir Turn and reposition every 2 hours Other: - Wear offloading boots at all times, no pressure on heels. Float heels on pillow. Additional Orders / Instructions Follow Nutritious Diet and Increase Protein Intake - Increase protein, supplement with Ensure, Boost, etc. Wound Treatment Wound #2 - Calcaneus Wound Laterality: Left Cleanser: Soap and Water (Home Health) 1 x Per Day/30 Days Discharge Instructions: Gently cleanse wound with antibacterial soap, rinse and pat dry prior to dressing wounds Cleanser: Wound Cleanser ([place]) 1 x Per Day/30 Days Discharge Instructions: Wash your hands with soap and water. Remove old dressing, discard into plastic bag and place into trash. Cleanse the wound with Wound Cleanser prior to applying a clean dressing using gauze sponges, not tissues or cotton balls. Do not scrub or use excessive force. Pat dry using gauze sponges, not tissue or cotton balls. Topical: Activon Honey Gel, 25 (g) Tube 1 x Per Day/30 Days Prim Dressing: Hydrofera Blue Ready Transfer Foam, 2.5x2.5 (in/in) 1 x Per Day/30 Days ary Discharge Instructions: Apply Hydrofera Blue Ready to wound bed as directed Secondary Dressing: Kerlix 4.5 x 4.1 (in/yd) ([place]) 1 x Per Day/30 Days Discharge Instructions: Apply Kerlix 4.5 x 4.1 (in/yd) as instructed Secured With: Medipore T - 48M Medipore H Soft Cloth Surgical T ape ape, 2x2 (in/yd) ([place]) 1 x Per Day/30 Days Add-Ons: ALLEVYN Heel 4 1/2in x 5 1/2in / 10.5cm x 13.5cm (Home Health) 1 x Per Day/30 Days Discharge Instructions: Apply the white, patterned surface to the heel. Wound #3 - Sacrum Wound Laterality: Midline, Proximal Cleanser: Soap and Water (Home Health) 1 x Per Day/30 Days Discharge Instructions: Gently cleanse wound with antibacterial soap, rinse and pat dry prior to dressing wounds Cleanser: Wound Cleanser ([place]) 1 x Per Day/30 Days Discharge Instructions: Wash your hands with soap and water. Remove old dressing, discard into plastic bag and place into trash. Cleanse the wound with Wound Cleanser prior to applying a clean dressing using gauze sponges, not tissues or cotton balls. Do not scrub or use excessive force. Pat dry using gauze sponges, not tissue or cotton balls.  Secondary Dressing: Kerlix 4.5 x 4.1 (in/yd) 1 x Per Day/30 Days Discharge Instructions: lightly packed into wound , moistened with dakins 1/4 solution Secured With: Bordered foam dressing. (Home Health) (Generic) 1 x Per Day/30 Days Wound #5 - Ankle Wound Laterality: Right, Lateral Cleanser: Soap and Water (Home Health) 1 x Per Day/30 Days Discharge Instructions: Gently cleanse wound with antibacterial soap, rinse and pat dry prior to dressing wounds Cleanser: Wound Cleanser ([place]) 1 x Per Day/30 Days Discharge Instructions: Wash your hands with soap and water. Remove old dressing, discard into plastic bag and place into trash. Cleanse the wound with Wound Cleanser prior to applying a clean dressing using gauze sponges, not tissues or cotton balls. Do not scrub or use excessive force. Pat dry using gauze sponges, not tissue or cotton balls. Topical: Activon Honey Gel, 25 (g) Tube 1 x Per Day/30  Days Prim Dressing: Hydrofera Blue Ready Transfer Foam, 2.5x2.5 (in/in) 1 x Per Day/30 Days ary Discharge Instructions: Apply Hydrofera Blue Ready to wound bed as directed Secondary Dressing: Kerlix 4.5 x 4.1 (in/yd) ([place]) 1 x Per Day/30 Days Discharge Instructions: Apply Kerlix 4.5 x 4.1 (in/yd) as instructed Secured With: Medipore T - 57M Medipore H Soft Cloth Surgical T ape ape, 2x2 (in/yd) ([place]) 1 x Per Day/30 Days Add-Ons: ALLEVYN Heel 4 1/2in x 5 1/2in / 10.5cm x 13.5cm (Home Health) 1 x Per Day/30 Days Discharge Instructions: Apply the white, patterned surface to the heel. Consults Plastic Surgery - consult for possible flap closure of wound to sacrum Electronic Signature(s) Signed: 07/01/2022 2:38:02 PM By: Kalman Shan DO Signed: 07/03/2022 11:27:58 AM By: Carlene Coria RN Shelda Pal, Donabelle ([id number]) [id number] Page 5 of 9 Entered By: Carlene Coria on 07/01/2022 12:47:22 -------------------------------------------------------------------------------- Problem List Details Patient Name: Date of Service: [name] Deed [name], [name] 07/01/2022 11:15 A M Medical Record Number: [id number] Patient Account Number: [id number] Date of Birth/Sex: Treating RN: December 17, 1960 (62 y.o. [name] Primary Care Provider: Gladstone Lighter Other Clinician: Referring Provider: Treating Provider/Extender: Milda Smart Weeks in Treatment: 8 Active Problems ICD-10 Encounter Code Description Active Date MDM Diagnosis L89.154 Pressure ulcer of sacral region, stage 4 05/06/2022 No Yes L89.153 Pressure ulcer of sacral region, stage 3 05/06/2022 No Yes L89.610 Pressure ulcer of right heel, unstageable 05/06/2022 No Yes L89.620 Pressure ulcer of left heel, unstageable 05/06/2022 No Yes G35 Multiple sclerosis 05/06/2022 No Yes I26.99 Other pulmonary embolism without acute cor pulmonale 05/06/2022 No Yes I82.409 Acute embolism and  thrombosis of unspecified deep veins of unspecified 05/06/2022 No Yes lower extremity Z79.01 Long term (current) use of anticoagulants 05/06/2022 No Yes 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, sacral and sacrococcygeal region 05/20/2022 No Yes Inactive Problems Resolved Problems Electronic Signature(s) Signed: 07/01/2022 2:38:02 PM By: Kalman Shan DO Entered By: Kalman Shan on 07/01/2022 12:29:28 -------------------------------------------------------------------------------- Progress Note Details Patient Name: Date of Service: [name] Deed [name], [name] 07/01/2022 11:15 A M Medical Record Number: [id number] Patient Account Number: [id number] Date of Birth/Sex: Treating RN: Jul 31, 1960 (62 y.o. [name] Primary Care Provider: Gladstone Lighter Other Clinician: Referring Provider: Treating Provider/Extender: Milda Smart [place], Aggie Moats ([id number]) [id number] Page 6 of 9 Weeks in Treatment: 8 Subjective Chief Complaint Information obtained from Patient 05/06/2022; Bilateral feet wounds and sacral wounds History of Present Illness (HPI) 05/06/2022 Ms. Kyden Elwell is a 62 year old female with a past medical history of multiple sclerosis and PEs and DVT that presents to the clinic for sacral s wounds  and Bilateral feet wounds. Patient states that she obtained the sacral ulcers when she was hospitalized in November 2023 for bilateral pulmonary embolisms. She was in the ICU on a ventilator. Since discharge she has been using Dakin's wet-to-dry dressings to the area. Since her hospitalization she has been bedbound. However she is doing physical therapy. Prior to being hospitalized she was able to pull herself up and transfer from bed to chair on her own. Due to her MS she is not fully mobile. Unfortunately she states that the wound has gotten larger on her sacrum despite Wound care. Patient has home health. On 04/16/2022  she was admitted to the hospital for severe sepsis secondary to acute UTI and sacral cellulitis. She completed 5 days of IV cefepime and vancomycin. She was not discharged with oral antibiotics. She states she developed the bilateral feet wounds at that time. She states she has bunny boots however she is not wearing them. She currently denies systemic signs of infection. 2/7; patient presents for follow-up. She had a bone biopsy done at last clinic visit that showed fragments of inflamed granulation tissue, necrotic material and bone with acute osteomyelitis. Culture grew Proteus mirabilis and Klebsiella pneumoniae. Patient is scheduled to see infectious disease next week. For now she has been doing Dakin's wet-to-dry dressings to the sacrum. She is not able to obtain Santyl and has been keeping the left heel covered. T the right foot where o there was a previous wound with scab however this has healed. This has remained closed. No open wound noted. She states over the past week she has had chills but no fevers, nausea/vomiting or purulent drainage. 2/21; patient presents for follow-up. She saw Dr. Steva Ready on 2/15. 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 Today she had feces impacted in the wound and throughout the tunneling. We discussed a diverting colostomy to address this issue. She was agreeable with a referral to general surgery. She currently denies systemic signs of infection. She has been using Medihoney and Hydrofera Blue to the left heel wound. She has been using her Prevalon boots. 3/20; patient presents for follow-up. She has been using Dakin's wet-to-dry dressings to the sacral wound. She has developed a new wound to her right lateral heel. She is using Medihoney and Hydrofera Blue to the left heel. She is currently taking Levaquin to complete 6 weeks. She follows with infectious disease for this. She saw general surgery at [facility] to discuss potentially doing a  diverting colostomy however per patient's surgeon did not recommend this. Objective Constitutional Vitals Time Taken: 11:17 AM, Temperature: 98.2 F, Pulse: 106 bpm, Respiratory Rate: 18 breaths/min, Blood Pressure: 89/54 mmHg. General Notes: Left heel: Eschar Right foot: T the lateral malleolus there is an open wound with eschar. Sacrum: Extensive large wound that tunnels and o undermines with Pale granulation tissue. No longer has exposed bone. Integumentary (Hair, Skin) Wound #2 status is Open. Original cause of wound was Pressure Injury. The date acquired was: 03/12/2022. The wound has been in treatment 8 weeks. The wound is located on the Left Calcaneus. The wound measures 2.5cm length x 1.4cm width x 0.1cm depth; 2.749cm^2 area and 0.275cm^3 volume. There is Fat Layer (Subcutaneous Tissue) exposed. There is no tunneling or undermining noted. There is a medium amount of serosanguineous drainage noted. There is medium (34-66%) granulation within the wound bed. There is a medium (34-66%) amount of necrotic tissue within the wound bed including Eschar and Adherent Slough. Wound #3 status is  Open. Original cause of wound was Pressure Injury. The date acquired was: 03/09/2022. The wound has been in treatment 8 weeks. The wound is located on the Proximal,Midline Sacrum. The wound measures 9cm length x 5cm width x 4.2cm depth; 35.343cm^2 area and 148.44cm^3 volume. There is bone, muscle, and Fat Layer (Subcutaneous Tissue) exposed. There is no tunneling noted, however, there is undermining starting at 12:00 and ending at 12:00 with a maximum distance of 16cm. There is a medium amount of serosanguineous drainage noted. There is large (67-100%) red, hyper - granulation within the wound bed. There is a small (1-33%) amount of necrotic tissue within the wound bed including Adherent Slough and Necrosis of Muscle. Wound #5 status is Open. Original cause of wound was Gradually Appeared. The date acquired  was: 06/22/2022. The wound is located on the Right,Lateral Ankle. The wound measures 1.5cm length x 0.5cm width x 0.1cm depth; 0.589cm^2 area and 0.059cm^3 volume. There is no tunneling or undermining noted. There is a none present amount of drainage noted. There is no granulation within the wound bed. There is a large (67-100%) amount of necrotic tissue within the wound bed including Eschar and Adherent Slough. Assessment Active Problems ICD-10 Pressure ulcer of sacral region, stage 4 Pressure ulcer of sacral region, stage 3 Pressure ulcer of right heel, unstageable Pressure ulcer of left heel, unstageable Multiple sclerosis Other pulmonary embolism without acute cor pulmonale Acute embolism and thrombosis of unspecified deep veins of unspecified lower extremity Long term (current) use of anticoagulants Osteomyelitis of vertebra, sacral and sacrococcygeal region CHARLETT, HINKLEY ([id number]) [id number] Page 7 of 9 Patient's wound no longer has bone exposed which is a good sign however the wound bed does not appear healthy. Patient has declined doing the diverting ostomy after discussing procedure with general surgery. There is a significant amount of tunneling and at this time I recommended continuing Dakin's wet-to- dry dressings. May be suitable for a wound VAC in the near future. Recommended she continue her oral antibiotics as prescribed by infectious disease. I will also think it would be best for patient to be referred to plastic surgery to discuss potential OR debridement and muscle flap. I recommended aggressive offloading. Unfortunately she has developed a new wound to the right lateral ankle consistent with pressure injury. I debrided nonviable tissue here. The left heel is stable. I debrided nonviable tissue here as well. T these wounds I recommended Medihoney and Hydrofera Blue. Continue bunny boots to offload o these areas. Follow-up in 4  weeks. Procedures Wound #2 Pre-procedure diagnosis of Wound #2 is a Pressure Ulcer located on the Left Calcaneus . There was a Excisional Skin/Subcutaneous Tissue Debridement with a total area of 3.5 sq cm performed by Kalman Shan, MD. With the following instrument(s): Curette to remove Viable and Non-Viable tissue/material. Material removed includes Eschar, Subcutaneous Tissue, and Slough. No specimens were taken. A time out was conducted at 11:52, prior to the start of the procedure. A Minimum amount of bleeding was controlled with Pressure. The procedure was tolerated well with a pain level of 0 throughout and a pain level of 0 following the procedure. Post Debridement Measurements: 2.5cm length x 1.4cm width x 0.1cm depth; 0.275cm^3 volume. Post debridement Stage noted as Unstageable/Unclassified. Character of Wound/Ulcer Post Debridement is improved. Post procedure Diagnosis Wound #2: Same as Pre-Procedure Wound #5 Pre-procedure diagnosis of Wound #5 is a Pressure Ulcer located on the Right,Lateral Ankle . There was a Selective/Open Wound Non-Viable Tissue Debridement with a total area of 0.75  sq cm performed by Kalman Shan, MD. With the following instrument(s): Curette to remove Viable and Non-Viable tissue/material. Material removed includes Eschar. No specimens were taken. A time out was conducted at 11:52, prior to the start of the procedure. A Minimum amount of bleeding was controlled with Pressure. The procedure was tolerated well with a pain level of 0 throughout and a pain level of 0 following the procedure. Post Debridement Measurements: 1.5cm length x 0.5cm width x 0.1cm depth; 0.059cm^3 volume. Post debridement Stage noted as Unstageable/Unclassified. Character of Wound/Ulcer Post Debridement is improved. Post procedure Diagnosis Wound #5: Same as Pre-Procedure Plan Follow-up Appointments: Return Appointment in 1 month Home Health: [place]: [facility]  [phone] [place] for wound care. May utilize formulary equivalent dressing for wound treatment orders unless otherwise specified. Home Health Nurse may visit PRN to address patientoos wound care needs. Scheduled days for dressing changes to be completed; exception, patient has scheduled wound care visit that day. **Please direct any NON-WOUND related issues/requests for orders to patient's Primary Care Physician. **If current dressing causes regression in wound condition, may D/C ordered dressing product/s and apply Normal Saline Moist Dressing daily until next [place] or Other MD appointment. **Notify [facility] of regression in wound condition at [phone]. Bathing/ Shower/ Hygiene: May shower; gently cleanse wound with antibacterial soap, rinse and pat dry prior to dressing wounds No tub bath. Anesthetic (Use 'Patient Medications' Section for Anesthetic Order Entry): Lidocaine applied to wound bed Off-Loading: Roho cushion for wheelchair Hospital bed/mattress Air fluidized (Group 3) Turn and reposition every 2 hours Other: - Wear offloading boots at all times, no pressure on heels. Float heels on pillow. Additional Orders / Instructions: Follow Nutritious Diet and Increase Protein Intake - Increase protein, supplement with Ensure, Boost, etc. WOUND #2: - Calcaneus Wound Laterality: Left Cleanser: Soap and Water (Home Health) 1 x Per Day/30 Days Discharge Instructions: Gently cleanse wound with antibacterial soap, rinse and pat dry prior to dressing wounds Cleanser: Wound Cleanser ([place]) 1 x Per Day/30 Days Discharge Instructions: Wash your hands with soap and water. Remove old dressing, discard into plastic bag and place into trash. Cleanse the wound with Wound Cleanser prior to applying a clean dressing using gauze sponges, not tissues or cotton balls. Do not scrub or use excessive force. Pat dry using gauze sponges, not tissue or  cotton balls. Topical: Activon Honey Gel, 25 (g) Tube 1 x Per Day/30 Days Prim Dressing: Hydrofera Blue Ready Transfer Foam, 2.5x2.5 (in/in) 1 x Per Day/30 Days ary Discharge Instructions: Apply Hydrofera Blue Ready to wound bed as directed Secondary Dressing: Kerlix 4.5 x 4.1 (in/yd) ([place]) 1 x Per Day/30 Days Discharge Instructions: Apply Kerlix 4.5 x 4.1 (in/yd) as instructed Secured With: Medipore T - 8M Medipore H Soft Cloth Surgical T ape ape, 2x2 (in/yd) ([place]) 1 x Per Day/30 Days Add-Ons: ALLEVYN Heel 4 1/2in x 5 1/2in / 10.5cm x 13.5cm (Home Health) 1 x Per Day/30 Days Discharge Instructions: Apply the white, patterned surface to the heel. WOUND #3: - Sacrum Wound Laterality: Midline, Proximal Cleanser: Soap and Water (Home Health) 1 x Per Day/30 Days Discharge Instructions: Gently cleanse wound with antibacterial soap, rinse and pat dry prior to dressing wounds Cleanser: Wound Cleanser ([place]) 1 x Per Day/30 Days Discharge Instructions: Wash your hands with soap and water. Remove old dressing, discard into plastic bag and place into trash. Cleanse the wound with Wound Cleanser prior to applying a  clean dressing using gauze sponges, not tissues or cotton balls. Do not scrub or use excessive force. Pat dry using gauze sponges, not tissue or cotton balls. Secondary Dressing: Conforming Guaze Roll-Medium [facility]) (Generic) 1 x Per Day/30 Days KAYTE, DEMASTUS ([id number]) [id number] Page 8 of 9 Discharge Instructions: Pack lightly with rolled gauze soaked in Dakins solution. Secured With: Bordered foam dressing. (Home Health) (Generic) 1 x Per Day/30 Days 1. In office sharp debridement 2. Medihoney and Hydrofera Blue 3. Dakin's wet-to-dry dressings 4. Referral to plastic surgery for potential muscle flap 5. Follow-up in 1 week Electronic Signature(s) Signed: 07/01/2022 2:38:02 PM By: Kalman Shan DO Entered By: Kalman Shan on 07/01/2022 12:41:36 -------------------------------------------------------------------------------- ROS/PFSH Details Patient Name: Date of Service: [name] Deed [name], [name] 07/01/2022 11:15 A M Medical Record Number: [id number] Patient Account Number: [id number] Date of Birth/Sex: Treating RN: [birth year] (62 y.o. [name] Primary Care Provider: Gladstone Lighter Other Clinician: Referring Provider: Treating Provider/Extender: Milda Smart Weeks in Treatment: 8 Information Obtained From Patient Respiratory Medical History: Past Medical History Notes: pulmonary hemorrage Cardiovascular Medical History: Positive for: Angina; Hypotension Endocrine Medical History: Negative for: Type II Diabetes Integumentary (Skin) Medical History: Negative for: History of pressure wounds Neurologic Medical History: Past Medical History Notes: MS Immunizations Pneumococcal Vaccine: Received Pneumococcal Vaccination: No Implantable Devices None Family and Social History Never smoker; Marital Status - Widowed; Alcohol Use: Never; Caffeine Use: Daily Electronic Signature(s) Signed: 07/01/2022 2:38:02 PM By: Kalman Shan DO Signed: 07/03/2022 11:27:58 AM By: Carlene Coria RN Entered By: Kalman Shan on 07/01/2022 12:42:11 [name] ([id number]) [id number] Page 9 of 9 -------------------------------------------------------------------------------- SuperBill Details Patient Name: Date of Service: [name] Deed [name], [name] 07/01/2022 Medical Record Number: [id number] Patient Account Number: [id number] Date of Birth/Sex: Treating RN: 01-17-1961 (62 y.o. [name] Primary Care Provider: Gladstone Lighter Other Clinician: Referring Provider: Treating Provider/Extender: Milda Smart Weeks in Treatment: 8 Diagnosis Coding ICD-10 Codes Code Description L89.154 Pressure ulcer  of sacral region, stage 4 L89.153 Pressure ulcer of sacral region, stage 3 L89.610 Pressure ulcer of right heel, unstageable L89.620 Pressure ulcer of left heel, unstageable G35 Multiple sclerosis I26.99 Other pulmonary embolism without acute cor pulmonale I82.409 Acute embolism and thrombosis of unspecified deep veins of unspecified lower extremity Z79.01 Long term (current) use of anticoagulants 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, sacral and sacrococcygeal region Facility Procedures : CPT4 Code: [id number] Description: 11042 - DEB SUBQ TISSUE 20 SQ CM/< ICD-10 Diagnosis Description L89.620 Pressure ulcer of left heel, unstageable Modifier: Quantity: 1 : CPT4 Code: [id number] Description: [id number] - DEBRIDE WOUND 1ST 20 SQ CM OR < ICD-10 Diagnosis Description L89.610 Pressure ulcer of right heel, unstageable Modifier: Quantity: 1 Physician Procedures : CPT4 Code Description Modifier [id number] - WC PHYS LEVEL 3 - EST PT ICD-10 Diagnosis Description L89.154 Pressure ulcer of sacral region, stage 4 L89.153 Pressure ulcer of sacral region, stage 3 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, sacral and  sacrococcygeal region Quantity: 1 : [id number] 11042 - WC PHYS SUBQ TISS 20 SQ CM ICD-10 Diagnosis Description L89.620 Pressure ulcer of left heel, unstageable Quantity: 1 : [id number] - WC PHYS DEBR WO ANESTH 20 SQ CM ICD-10 Diagnosis Description L89.610 Pressure ulcer of right heel, unstageable Quantity: 1 Electronic Signature(s) Signed: 07/01/2022 2:38:02 PM By: Kalman Shan DO Entered By: Kalman Shan on 07/01/2022 12:41:43

## 2022-07-06 ENCOUNTER — Other Ambulatory Visit: Payer: Medicare Other

## 2022-07-06 VITALS — HR 101 | Temp 97.6°F

## 2022-07-06 DIAGNOSIS — Z515 Encounter for palliative care: Secondary | ICD-10-CM

## 2022-07-06 NOTE — Progress Notes (Signed)
PATIENT NAME: [name] DOB: 14-Jan-1961 MRN: [id number]  PRIMARY CARE PROVIDER: Gladstone Lighter, MD  RESPONSIBLE PARTY:  Acct ID - Guarantor Home Phone Work Phone Relationship Acct Type  [id number] Kennetra [id number]  Self P/F     [address], [place], [place] [address]    ACP:  Patient has completed living will documents.  She desires DNR status.  Form completed and left with patient.   Sacral wound:  Patient advised area is slowly improving.  There has been discussion on a wound vac but patient has been advised the wound needs to close more before that can occur.      CODE STATUS: DNR ADVANCED DIRECTIVES: Yes MOST FORM: No PPS: 30%   PHYSICAL EXAM:   VITALS: Today's Vitals   07/06/22 1413  Pulse: (!) 101  Temp: 97.6 F (36.4 C)  SpO2: 95%           Lorenza Burton, RN

## 2022-07-09 ENCOUNTER — Other Ambulatory Visit: Payer: Medicare Other

## 2022-07-09 DIAGNOSIS — Z515 Encounter for palliative care: Secondary | ICD-10-CM

## 2022-07-09 NOTE — Progress Notes (Signed)
TELEPHONE ENCOUNTER  Palliative care SW connected with patient, per [facility] RN request to discuss transportation resources and other care needs.  Patient shared that she was in need of assistance with transportation as currently she transports via non-emergency EMS due to not being able to sit up for long period of times in her WC. Patient express financial strain of current transportation route.   Resources: SW and patient discussed medicaid, patient shared that she does not qualify for medicaid. Patient is not connected with any MS groups in the area. SW to provide MS resources that patient may be able to access for financial support.  Multiple [place] [email or web address]/Get-Help/MSF-Programs-Grants/Transportation-Assistance-[name]   [facility] [employer] - now meeting virtually Meet every 4th Tue @615pm  Contact information - [email or web address] [phone]

## 2022-07-14 ENCOUNTER — Encounter (HOSPITAL_COMMUNITY): Payer: Self-pay | Admitting: Oncology

## 2022-07-23 LAB — INTELLIGEN MYELOID

## 2022-07-29 ENCOUNTER — Encounter: Payer: Medicare Other | Attending: Internal Medicine | Admitting: Internal Medicine

## 2022-07-29 DIAGNOSIS — Z7901 Long term (current) use of anticoagulants: Secondary | ICD-10-CM | POA: Diagnosis not present

## 2022-07-29 DIAGNOSIS — L89154 Pressure ulcer of sacral region, stage 4: Secondary | ICD-10-CM | POA: Diagnosis not present

## 2022-07-29 DIAGNOSIS — G35 Multiple sclerosis: Secondary | ICD-10-CM | POA: Insufficient documentation

## 2022-07-29 DIAGNOSIS — Z86711 Personal history of pulmonary embolism: Secondary | ICD-10-CM | POA: Diagnosis not present

## 2022-07-29 DIAGNOSIS — L89610 Pressure ulcer of right heel, unstageable: Secondary | ICD-10-CM | POA: Diagnosis not present

## 2022-07-29 DIAGNOSIS — L89620 Pressure ulcer of left heel, unstageable: Secondary | ICD-10-CM | POA: Diagnosis not present

## 2022-07-29 NOTE — Progress Notes (Signed)
[name], [name] ([id number]) [id number] Page 1 of 9 Visit Report for 07/29/2022 Chief Complaint Document Details Patient Name: Date of Service: [name] STRA ND, MA [name] 07/29/2022 11:15 A M Medical Record Number: [id number] Patient Account Number: [id number] Date of Birth/Sex: Treating RN: 1960/07/01 (62 y.o. [name] Primary Care Provider: Enid [name] Other Clinician: Referring Provider: Treating Provider/Extender: [name] in Treatment: 12 Information Obtained from: Patient Chief Complaint 05/06/2022; Bilateral feet wounds and sacral wounds Electronic Signature(s) Signed: 07/29/2022 2:17:41 PM By: [name] Corwin DO Entered By: [name] on 07/29/2022 12:14:23 -------------------------------------------------------------------------------- Debridement Details Patient Name: Date of Service: [name] 07/29/2022 11:15 A M Medical Record Number: [id number] Patient Account Number: [id number] Date of Birth/Sex: Treating RN: [birth year] (63 y.o. [name] Primary Care Provider: Enid [name] Other Clinician: Referring Provider: Treating Provider/Extender: [name] in Treatment: 12 Debridement Performed for Assessment: Wound #2 Left Calcaneus Performed By: Physician [name] Corwin, MD Debridement Type: Debridement Level of Consciousness (Pre-procedure): Awake and Alert Pre-procedure Verification/Time Out Yes - 12:03 Taken: Start Time: 12:03 Pain Control: Lidocaine 2% Topical Gel T Area Debrided (L x W): otal 1 (cm) x 2 (cm) = 2 (cm) Tissue and other material debrided: Eschar, Slough, Slough Level: Non-Viable Tissue Debridement Description: Selective/Open Wound Instrument: Curette Bleeding: None End Time: 12:05 Procedural Pain: 0 Post Procedural Pain: 0 Response to Treatment: Procedure was tolerated well Level of  Consciousness (Post- Awake and Alert procedure): Post Debridement Measurements of Total Wound Length: (cm) 1 Stage: Unstageable/Unclassified Width: (cm) 2 Depth: (cm) 0.2 Volume: (cm) 0.314 Character of Wound/Ulcer Post Debridement: Requires Further Debridement Post Procedure Diagnosis Same as Pre-procedure Electronic Signature(s) Signed: 07/29/2022 2:17:41 PM By: [name] Corwin DO Signed: 07/29/2022 2:56:04 PM By: [name] Entered By: [name] on 07/29/2022 12:20:35 [name] ([id number]) [id number] Page 2 of 9 -------------------------------------------------------------------------------- Debridement Details Patient Name: Date of Service: [name] 07/29/2022 11:15 A M Medical Record Number: [id number] Patient Account Number: [id number] Date of Birth/Sex: Treating RN: 1960/09/27 (62 y.o. [name] Primary Care Provider: Enid [name] Other Clinician: Referring Provider: Treating Provider/Extender: [name] in Treatment: 12 Debridement Performed for Assessment: Wound #5 Right,Lateral Ankle Performed By: Physician [name] Corwin, MD Debridement Type: Debridement Level of Consciousness (Pre-procedure): Awake and Alert Pre-procedure Verification/Time Out Yes - 12:03 Taken: Start Time: 12:03 Pain Control: Lidocaine 2% T opical Gel T Area Debrided (L x W): otal 1.5 (cm) x 0.5 (cm) = 0.75 (cm) Tissue and other material debrided: Eschar, Slough, Slough Level: Non-Viable Tissue Debridement Description: Selective/Open Wound Instrument: Curette Bleeding: None End Time: 12:05 Procedural Pain: 0 Post Procedural Pain: 0 Response to Treatment: Procedure was tolerated well Level of Consciousness (Post- Awake and Alert procedure): Post Debridement Measurements of Total Wound Length: (cm) 1.5 Stage: Unstageable/Unclassified Width: (cm) 0.5 Depth: (cm)  0.2 Volume: (cm) 0.118 Character of Wound/Ulcer Post Debridement: Requires Further Debridement Post Procedure Diagnosis Same as Pre-procedure Electronic Signature(s) Signed: 07/29/2022 2:17:41 PM By: [name] Corwin DO Signed: 07/29/2022 2:56:04 PM By: [name] Entered By: [name] on 07/29/2022 12:20:50 -------------------------------------------------------------------------------- HPI Details Patient Name: Date of Service: [name] 07/29/2022 11:15 A M Medical Record Number: [id number] Patient Account Number: [id number] Date of Birth/Sex: Treating RN: 19-Dec-1960 (62 y.o. [name] Primary Care Provider: Enid [name] Other Clinician: Referring Provider: Treating Provider/Extender: [name] in Treatment: 12 History of Present Illness  HPI Description: 05/06/2022 [name] is a 62 year old female with a past medical history of multiple sclerosis and PEs and DVT that presents to the clinic for sacral s wounds and Bilateral feet wounds. Patient states that she obtained the sacral ulcers when she was hospitalized in November 2023 for bilateral pulmonary embolisms. She was in the ICU on a ventilator. Since discharge she has been using Dakin's wet-to-dry dressings to the area. Since her hospitalization she has been bedbound. However she is doing physical therapy. Prior to being hospitalized she was able to pull herself up and transfer from bed to chair on her own. Due to her MS she is not fully mobile. Unfortunately she states that the wound has gotten larger on her sacrum despite Wound care. Patient has home health. On 04/16/2022 she was admitted to the hospital for severe sepsis secondary to acute UTI and sacral cellulitis. She completed 5 days of IV cefepime and vancomycin. She was not discharged with oral antibiotics. She states she developed the bilateral feet wounds at that time. She states she has  bunny boots however she is not wearing them. She currently denies systemic signs of infection. 2/7; patient presents for follow-up. She had a bone biopsy done at last clinic visit that showed fragments of inflamed granulation tissue, necrotic material and bone with acute osteomyelitis. Culture grew Proteus mirabilis and Klebsiella pneumoniae. Patient is scheduled to see infectious disease next week. For now she has been doing Dakin's wet-to-dry dressings to the sacrum. She is not able to obtain Santyl and has been keeping the left heel covered. T the right foot where o there was a previous wound with scab however this has healed. This has remained closed. No open wound noted. She states over the past week she has had chills but no fevers, nausea/vomiting or purulent drainage. 2/21; patient presents for follow-up. She saw Dr. Joylene Draft on 2/15. 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 Today she had feces [place], [name] ([id number]) [id number] Page 3 of 9 impacted in the wound and throughout the tunneling. We discussed a diverting colostomy to address this issue. She was agreeable with a referral to general surgery. She currently denies systemic signs of infection. She has been using Medihoney and Hydrofera Blue to the left heel wound. She has been using her Prevalon boots. 3/20; patient presents for follow-up. She has been using Dakin's wet-to-dry dressings to the sacral wound. She has developed a new wound to her right lateral heel. She is using Medihoney and Hydrofera Blue to the left heel. She is currently taking Levaquin to complete 6 [name]. She follows with infectious disease for this. She saw general surgery at [facility] to discuss potentially doing a diverting colostomy however per patient's surgeon did not recommend this. 4/17; patient presents for follow-up. Has been using Dakin's wet-to-dry dressings to the sacral wound. She has been  using Medihoney and Hydrofera Blue to the heel wounds. She states she is using her bunny boots for offloading the heels. She has not heard from plastic surgery for consultation. We gave her the number to call to follow this up. Electronic Signature(s) Signed: 07/29/2022 2:17:41 PM By: [name] Corwin DO Entered By: [name] on 07/29/2022 12:15:09 -------------------------------------------------------------------------------- Physical Exam Details Patient Name: Date of Service: [name] 07/29/2022 11:15 A M Medical Record Number: [id number] Patient Account Number: [id number] Date of Birth/Sex: Treating RN: Jan 30, 1961 (62 y.o. [name] Primary Care Provider: Enid [name] Other Clinician: Referring Provider:  Treating Provider/Extender: [name] in Treatment: 12 Constitutional . Cardiovascular . Psychiatric . Notes Left heel: Eschar Right foot: T the lateral malleolus there is an open wound with eschar. Sacrum: Extensive large wound that tunnels and undermines with o granulation tissue, Although not the healthiest in appearance. No longer has exposed bone. Electronic Signature(s) Signed: 07/29/2022 2:17:41 PM By: [name] Corwin DO Entered By: [name] on 07/29/2022 12:15:54 -------------------------------------------------------------------------------- Physician Orders Details Patient Name: Date of Service: [name] 07/29/2022 11:15 A M Medical Record Number: [id number] Patient Account Number: [id number] Date of Birth/Sex: Treating RN: [birth year] (62 y.o. [name] Primary Care Provider: Enid [name] Other Clinician: Referring Provider: Treating Provider/Extender: [name] in Treatment: 12 Verbal / Phone Orders: No Diagnosis Coding Follow-up Appointments Return Appointment in 1 month Home Health Home Health Company: - [facility]  [phone] [facility] for wound care. May utilize formulary equivalent dressing for wound treatment orders unless otherwise specified. Home Health Nurse may visit PRN to address patients wound care needs. Scheduled days for dressing changes to be completed; exception, patient has scheduled wound care visit that day. **Please direct any NON-WOUND related issues/requests for orders to patient's Primary Care Physician. **If current dressing causes regression in wound condition, may D/C ordered dressing product/s and apply Normal Saline Moist Dressing daily until next Wound Healing Center or Other MD appointment. **Notify Wound Healing Center of regression in wound condition at [phone]. Bathing/ Shower/ Hygiene May shower; gently cleanse wound with antibacterial soap, rinse and pat dry prior to dressing wounds No tub bath. [name], [name] ([id number]) [id number] Page 4 of 9 Anesthetic (Use 'Patient Medications' Section for Anesthetic Order Entry) Lidocaine applied to wound bed Off-Loading Roho cushion for wheelchair Hospital bed/mattress A fluidized (Group 3) ir Turn and reposition every 2 hours Other: - Wear offloading boots at all times, no pressure on heels. Float heels on pillow. Additional Orders / Instructions Follow Nutritious Diet and Increase Protein Intake - Increase protein, supplement with Ensure, Boost, etc. Wound Treatment Wound #2 - Calcaneus Wound Laterality: Left Cleanser: Soap and Water (Home Health) 1 x Per Day/30 Days Discharge Instructions: Gently cleanse wound with antibacterial soap, rinse and pat dry prior to dressing wounds Cleanser: Wound Cleanser (Home Health) 1 x Per Day/30 Days Discharge Instructions: Wash your hands with soap and water. Remove old dressing, discard into plastic bag and place into trash. Cleanse the wound with Wound Cleanser prior to applying a clean dressing using gauze sponges, not tissues or  cotton balls. Do not scrub or use excessive force. Pat dry using gauze sponges, not tissue or cotton balls. Topical: Santyl Collagenase Ointment, 30 (gm), tube 1 x Per Day/30 Days Discharge Instructions: apply nickel thick to wound bed only Secondary Dressing: Kerlix 4.5 x 4.1 (in/yd) (Home Health) 1 x Per Day/30 Days Discharge Instructions: Apply Kerlix 4.5 x 4.1 (in/yd) as instructed Secured With: Medipore T - 60M Medipore H Soft Cloth Surgical T ape ape, 2x2 (in/yd) (Home Health) 1 x Per Day/30 Days Add-Ons: ALLEVYN Heel 4 1/2in x 5 1/2in / 10.5cm x 13.5cm (Home Health) 1 x Per Day/30 Days Discharge Instructions: Apply the white, patterned surface to the heel. Wound #3 - Sacrum Wound Laterality: Midline, Proximal Cleanser: Dakin 16 (oz) 0.25 1 x Per Day/30 Days Discharge Instructions: Use as directed. Cleanser: Soap and Water (Home Health) 1 x Per Day/30 Days Discharge Instructions: Gently cleanse wound with antibacterial soap, rinse and pat dry prior to  dressing wounds Secondary Dressing: Kerlix 4.5 x 4.1 (in/yd) 1 x Per Day/30 Days Discharge Instructions: lightly packed into wound , moistened with dakins 1/4 solution Secured With: Bordered foam dressing. (Home Health) (Generic) 1 x Per Day/30 Days Wound #5 - Ankle Wound Laterality: Right, Lateral Cleanser: Soap and Water (Home Health) 1 x Per Day/30 Days Discharge Instructions: Gently cleanse wound with antibacterial soap, rinse and pat dry prior to dressing wounds Cleanser: Wound Cleanser (Home Health) 1 x Per Day/30 Days Discharge Instructions: Wash your hands with soap and water. Remove old dressing, discard into plastic bag and place into trash. Cleanse the wound with Wound Cleanser prior to applying a clean dressing using gauze sponges, not tissues or cotton balls. Do not scrub or use excessive force. Pat dry using gauze sponges, not tissue or cotton balls. Topical: Santyl Collagenase Ointment, 30 (gm), tube 1 x Per Day/30  Days Discharge Instructions: apply nickel thick to wound bed only Secondary Dressing: Kerlix 4.5 x 4.1 (in/yd) (Home Health) 1 x Per Day/30 Days Discharge Instructions: Apply Kerlix 4.5 x 4.1 (in/yd) as instructed Secured With: Medipore T - 18M Medipore H Soft Cloth Surgical T ape ape, 2x2 (in/yd) (Home Health) 1 x Per Day/30 Days Add-Ons: ALLEVYN Heel 4 1/2in x 5 1/2in / 10.5cm x 13.5cm (Home Health) 1 x Per Day/30 Days Discharge Instructions: Apply the white, patterned surface to the heel. Patient Medications llergies: erythromycin base, Lexapro A Notifications Medication Indication Start End 07/29/2022 Santyl DOSE 1 - topical 250 unit/gram ointment - Apply daily to the wound bed [place], [name] ([id number]) [phone].pdf Page 5 of 9 Notes BE SURE TO GENTLY PACK 5 o'clock TUNNEL TO SACRAL ULCER Electronic Signature(s) Signed: 07/29/2022 2:17:41 PM By: [name] Corwin DO Signed: 07/29/2022 2:56:04 PM By: [name] Previous Signature: 07/29/2022 12:22:47 PM Version By: [name] Corwin DO Entered By: [name] on 07/29/2022 12:58:08 -------------------------------------------------------------------------------- Problem List Details Patient Name: Date of Service: [name] 07/29/2022 11:15 A M Medical Record Number: [id number] Patient Account Number: [id number] Date of Birth/Sex: Treating RN: [birth year] (62 y.o. [name] Primary Care Provider: Enid [name] Other Clinician: Referring Provider: Treating Provider/Extender: [name] in Treatment: 12 Active Problems ICD-10 Encounter Code Description Active Date MDM Diagnosis L89.154 Pressure ulcer of sacral region, stage 4 05/06/2022 No Yes L89.610 Pressure ulcer of right heel, unstageable 05/06/2022 No Yes L89.620 Pressure ulcer of left heel, unstageable 05/06/2022 No Yes G35 Multiple sclerosis 05/06/2022 No Yes I26.99 Other pulmonary  embolism without acute cor pulmonale 05/06/2022 No Yes I82.409 Acute embolism and thrombosis of unspecified deep veins of unspecified 05/06/2022 No Yes lower extremity Z79.01 Long term (current) use of anticoagulants 05/06/2022 No Yes 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, sacral and sacrococcygeal region 05/20/2022 No Yes Inactive Problems ICD-10 Code Description Active Date Inactive Date L89.153 Pressure ulcer of sacral region, stage 3 05/06/2022 05/06/2022 Resolved Problems Electronic Signature(s) Signed: 07/29/2022 2:17:41 PM By: [name] Corwin DO Entered By: [name] on 07/29/2022 12:14:18 [name] ([id number]) [id number] Page 6 of 9 -------------------------------------------------------------------------------- Progress Note Details Patient Name: Date of Service: [name] 07/29/2022 11:15 A M Medical Record Number: [id number] Patient Account Number: [id number] Date of Birth/Sex: Treating RN: [birth year] (62 y.o. [name] Primary Care Provider: Enid [name] Other Clinician: Referring Provider: Treating Provider/Extender: [name] in Treatment: 12 Subjective Chief Complaint Information obtained from Patient 05/06/2022; Bilateral feet wounds and sacral wounds History of Present Illness (HPI) 05/06/2022 Ms.  [name] is a 62 year old female with a past medical history of multiple sclerosis and PEs and DVT that presents to the clinic for sacral s wounds and Bilateral feet wounds. Patient states that she obtained the sacral ulcers when she was hospitalized in November 2023 for bilateral pulmonary embolisms. She was in the ICU on a ventilator. Since discharge she has been using Dakin's wet-to-dry dressings to the area. Since her hospitalization she has been bedbound. However she is doing physical therapy. Prior to being hospitalized she was able to pull herself up and  transfer from bed to chair on her own. Due to her MS she is not fully mobile. Unfortunately she states that the wound has gotten larger on her sacrum despite Wound care. Patient has home health. On 04/16/2022 she was admitted to the hospital for severe sepsis secondary to acute UTI and sacral cellulitis. She completed 5 days of IV cefepime and vancomycin. She was not discharged with oral antibiotics. She states she developed the bilateral feet wounds at that time. She states she has bunny boots however she is not wearing them. She currently denies systemic signs of infection. 2/7; patient presents for follow-up. She had a bone biopsy done at last clinic visit that showed fragments of inflamed granulation tissue, necrotic material and bone with acute osteomyelitis. Culture grew Proteus mirabilis and Klebsiella pneumoniae. Patient is scheduled to see infectious disease next week. For now she has been doing Dakin's wet-to-dry dressings to the sacrum. She is not able to obtain Santyl and has been keeping the left heel covered. T the right foot where o there was a previous wound with scab however this has healed. This has remained closed. No open wound noted. She states over the past week she has had chills but no fevers, nausea/vomiting or purulent drainage. 2/21; patient presents for follow-up. She saw Dr. Joylene Draft on 2/15. 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 Today she had feces impacted in the wound and throughout the tunneling. We discussed a diverting colostomy to address this issue. She was agreeable with a referral to general surgery. She currently denies systemic signs of infection. She has been using Medihoney and Hydrofera Blue to the left heel wound. She has been using her Prevalon boots. 3/20; patient presents for follow-up. She has been using Dakin's wet-to-dry dressings to the sacral wound. She has developed a new wound to her right lateral heel. She is using  Medihoney and Hydrofera Blue to the left heel. She is currently taking Levaquin to complete 6 [name]. She follows with infectious disease for this. She saw general surgery at [facility] to discuss potentially doing a diverting colostomy however per patient's surgeon did not recommend this. 4/17; patient presents for follow-up. Has been using Dakin's wet-to-dry dressings to the sacral wound. She has been using Medihoney and Hydrofera Blue to the heel wounds. She states she is using her bunny boots for offloading the heels. She has not heard from plastic surgery for consultation. We gave her the number to call to follow this up. Objective Constitutional Vitals Time Taken: 11:19 AM, Temperature: 98 F, Pulse: 107 bpm, Respiratory Rate: 18 breaths/min, Blood Pressure: 88/50 mmHg. General Notes: Left heel: Eschar Right foot: T the lateral malleolus there is an open wound with eschar. Sacrum: Extensive large wound that tunnels and o undermines with granulation tissue, Although not the healthiest in appearance. No longer has exposed bone. Integumentary (Hair, Skin) Wound #2 status is Open. Original cause of wound was Pressure Injury.  The date acquired was: 03/12/2022. The wound has been in treatment 12 [name]. The wound is located on the Left Calcaneus. The wound measures 1cm length x 2cm width x 0.3cm depth; 1.571cm^2 area and 0.471cm^3 volume. There is Fat Layer (Subcutaneous Tissue) exposed. There is a small amount of serous drainage noted. There is no granulation within the wound bed. There is a large (67- 100%) amount of necrotic tissue within the wound bed including Eschar and Adherent Slough. Wound #3 status is Open. Original cause of wound was Pressure Injury. The date acquired was: 03/09/2022. The wound has been in treatment 12 [name]. The wound is located on the Proximal,Midline Sacrum. The wound measures 4.5cm length x 5cm width x 3cm depth; 17.671cm^2 area and 53.014cm^3 volume. There is muscle and  Fat Layer (Subcutaneous Tissue) exposed. Tunneling has been noted at 5:00 with a maximum distance of 10.5cm. Undermining begins at 12:00 and ends at 12:00 with a maximum distance of 2.5cm. There is a large amount of serosanguineous drainage noted. The wound margin is epibole. There is large (67-100%) red, hyper - granulation within the wound bed. There is a small (1-33%) amount of necrotic tissue within the wound bed. Wound #5 status is Open. Original cause of wound was Gradually Appeared. The date acquired was: 06/22/2022. The wound has been in treatment 4 [name]. The wound is located on the Right,Lateral Ankle. The wound measures 1.5cm length x 0.5cm width x 0.2cm depth; 0.589cm^2 area and 0.118cm^3 volume. There is Fat Layer (Subcutaneous Tissue) exposed. There is a none present amount of drainage noted. There is no granulation within the wound bed. There is a large (67-100%) amount of necrotic tissue within the wound bed including Eschar. [name], [name] ([id number]) [id number] Page 7 of 9 Assessment Active Problems ICD-10 Pressure ulcer of sacral region, stage 4 Pressure ulcer of right heel, unstageable Pressure ulcer of left heel, unstageable Multiple sclerosis Other pulmonary embolism without acute cor pulmonale Acute embolism and thrombosis of unspecified deep veins of unspecified lower extremity Long term (current) use of anticoagulants Osteomyelitis of vertebra, sacral and sacrococcygeal region Patient's heel wounds are stable. I debrided nonviable tissue. She has palpable pulses but will obtain ABIs to ensure she has adequate blood flow for healing. I recommended also Santyl to help with further debridement and stopping the Medihoney and Hydrofera Blue. Continue bunny boots for offloading. The sacral wound is stable as well. No signs of surrounding soft tissue infection. I recommended she follow-up with infectious disease as she states she finished her  6 [name] of Levaquin. Continue Dakin's wet-to-dry dressings. Continue aggressive offloading. We referred her to plastic surgery for consideration of a muscle flap an OR debridement. She states she has not heard back from the office. We gave him the number to call to follow this up. Follow-up in 1 month. Procedures Wound #2 Pre-procedure diagnosis of Wound #2 is a Pressure Ulcer located on the Left Calcaneus . There was a Selective/Open Wound Non-Viable Tissue Debridement with a total area of 2 sq cm performed by [name] Corwin, MD. With the following instrument(s): Curette Material removed includes Eschar and Slough and after achieving pain control using Lidocaine 2% Topical Gel. No specimens were taken. A time out was conducted at 12:03, prior to the start of the procedure. There was no bleeding. The procedure was tolerated well with a pain level of 0 throughout and a pain level of 0 following the procedure. Post Debridement Measurements: 1cm length x 2cm width x 0.2cm depth; 0.314cm^3 volume. Post  debridement Stage noted as Unstageable/Unclassified. Character of Wound/Ulcer Post Debridement requires further debridement. Post procedure Diagnosis Wound #2: Same as Pre-Procedure Wound #5 Pre-procedure diagnosis of Wound #5 is a Pressure Ulcer located on the Right,Lateral Ankle . There was a Selective/Open Wound Non-Viable Tissue Debridement with a total area of 0.75 sq cm performed by [name] Corwin, MD. With the following instrument(s): Curette Material removed includes Eschar and Slough and after achieving pain control using Lidocaine 2% Topical Gel. No specimens were taken. A time out was conducted at 12:03, prior to the start of the procedure. There was no bleeding. The procedure was tolerated well with a pain level of 0 throughout and a pain level of 0 following the procedure. Post Debridement Measurements: 1.5cm length x 0.5cm width x 0.2cm depth; 0.118cm^3 volume. Post debridement Stage  noted as Unstageable/Unclassified. Character of Wound/Ulcer Post Debridement requires further debridement. Post procedure Diagnosis Wound #5: Same as Pre-Procedure Plan Follow-up Appointments: Return Appointment in 1 month Home Health: Home Health Company: - [facility] [phone] [facility] for wound care. May utilize formulary equivalent dressing for wound treatment orders unless otherwise specified. Home Health Nurse may visit PRN to address patientoos wound care needs. Scheduled days for dressing changes to be completed; exception, patient has scheduled wound care visit that day. **Please direct any NON-WOUND related issues/requests for orders to patient's Primary Care Physician. **If current dressing causes regression in wound condition, may D/C ordered dressing product/s and apply Normal Saline Moist Dressing daily until next Wound Healing Center or Other MD appointment. **Notify Wound Healing Center of regression in wound condition at [phone]. Bathing/ Shower/ Hygiene: May shower; gently cleanse wound with antibacterial soap, rinse and pat dry prior to dressing wounds No tub bath. Anesthetic (Use 'Patient Medications' Section for Anesthetic Order Entry): Lidocaine applied to wound bed Off-Loading: Roho cushion for wheelchair Hospital bed/mattress Air fluidized (Group 3) Turn and reposition every 2 hours Other: - Wear offloading boots at all times, no pressure on heels. Float heels on pillow. Additional Orders / Instructions: Follow Nutritious Diet and Increase Protein Intake - Increase protein, supplement with Ensure, Boost, etc. WOUND #2: - Calcaneus Wound Laterality: Left Cleanser: Soap and Water (Home Health) 1 x Per Day/30 Days Discharge Instructions: Gently cleanse wound with antibacterial soap, rinse and pat dry prior to dressing wounds Cleanser: Wound Cleanser (Home Health) 1 x Per Day/30 Days Discharge Instructions: Wash your hands with soap and water.  Remove old dressing, discard into plastic bag and place into trash. Cleanse the wound with Wound Cleanser prior to applying a clean dressing using gauze sponges, not tissues or cotton balls. Do not scrub or use excessive force. Pat dry using gauze sponges, not tissue or cotton balls. Topical: Santyl Collagenase Ointment, 30 (gm), tube 1 x Per Day/30 Days Discharge Instructions: apply nickel thick to wound bed only Secondary Dressing: Kerlix 4.5 x 4.1 (in/yd) (Home Health) 1 x Per Day/30 Days Discharge Instructions: Apply Kerlix 4.5 x 4.1 (in/yd) as instructed Secured With: Medipore T - 3M Medipore H Soft Cloth Surgical T ape ape, 2x2 (in/yd) (Home Health) 1 x Per Day/30 Days [name], [name] ([id number]) [phone].pdf Page 8 of 9 Add-Ons: ALLEVYN Heel 4 1/2in x 5 1/2in / 10.5cm x 13.5cm (Home Health) 1 x Per Day/30 Days Discharge Instructions: Apply the white, patterned surface to the heel. WOUND #3: - Sacrum Wound Laterality: Midline, Proximal Cleanser: Dakin 16 (oz) 0.25 1 x Per Day/30 Days Discharge Instructions: Use as directed. Cleanser: Soap and Water [facility]) 1  x Per Day/30 Days Discharge Instructions: Gently cleanse wound with antibacterial soap, rinse and pat dry prior to dressing wounds Secondary Dressing: Kerlix 4.5 x 4.1 (in/yd) 1 x Per Day/30 Days Discharge Instructions: lightly packed into wound , moistened with dakins 1/4 solution Secured With: Bordered foam dressing. (Home Health) (Generic) 1 x Per Day/30 Days WOUND #5: - Ankle Wound Laterality: Right, Lateral Cleanser: Soap and Water (Home Health) 1 x Per Day/30 Days Discharge Instructions: Gently cleanse wound with antibacterial soap, rinse and pat dry prior to dressing wounds Cleanser: Wound Cleanser (Home Health) 1 x Per Day/30 Days Discharge Instructions: Wash your hands with soap and water. Remove old dressing, discard into plastic bag and place into trash. Cleanse the wound with  Wound Cleanser prior to applying a clean dressing using gauze sponges, not tissues or cotton balls. Do not scrub or use excessive force. Pat dry using gauze sponges, not tissue or cotton balls. Topical: Santyl Collagenase Ointment, 30 (gm), tube 1 x Per Day/30 Days Discharge Instructions: apply nickel thick to wound bed only Secondary Dressing: Kerlix 4.5 x 4.1 (in/yd) (Home Health) 1 x Per Day/30 Days Discharge Instructions: Apply Kerlix 4.5 x 4.1 (in/yd) as instructed Secured With: Medipore T - 43M Medipore H Soft Cloth Surgical T ape ape, 2x2 (in/yd) (Home Health) 1 x Per Day/30 Days Add-Ons: ALLEVYN Heel 4 1/2in x 5 1/2in / 10.5cm x 13.5cm (Home Health) 1 x Per Day/30 Days Discharge Instructions: Apply the white, patterned surface to the heel. 1. In office sharp debridement 2. Santyl 3. Dakin's wet-to-dry dressings 4. Aggressive offloading 5. Follow-up in 1 month Electronic Signature(s) Signed: 07/29/2022 2:17:41 PM By: [name] Corwin DO Entered By: [name] on 07/29/2022 12:20:59 -------------------------------------------------------------------------------- ROS/PFSH Details Patient Name: Date of Service: [name] 07/29/2022 11:15 A M Medical Record Number: [id number] Patient Account Number: [id number] Date of Birth/Sex: Treating RN: [birth year] (62 y.o. [name] Primary Care Provider: Enid [name] Other Clinician: Referring Provider: Treating Provider/Extender: [name] in Treatment: 12 Information Obtained From Patient Respiratory Medical History: Past Medical History Notes: pulmonary hemorrage Cardiovascular Medical History: Positive for: Angina; Hypotension Endocrine Medical History: Negative for: Type II Diabetes Integumentary (Skin) Medical History: Negative for: History of pressure wounds Neurologic Medical History: Past Medical History NotesMarland Kitchen MS [name], [name] ([id number])  [id number] Page 9 of 9 Immunizations Pneumococcal Vaccine: Received Pneumococcal Vaccination: No Implantable Devices None Family and Social History Never smoker; Marital Status - Widowed; Alcohol Use: Never; Caffeine Use: Daily Electronic Signature(s) Signed: 07/29/2022 2:17:41 PM By: [name] Corwin DO Signed: 07/29/2022 2:56:04 PM By: [name] Entered By: [name] on 07/29/2022 12:23:07 -------------------------------------------------------------------------------- SuperBill Details Patient Name: Date of Service: [name] 07/29/2022 Medical Record Number: [id number] Patient Account Number: [id number] Date of Birth/Sex: Treating RN: Feb 18, 1961 (62 y.o. 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, sacral and sacrococcygeal region Facility Procedures : CPT4 Code: 08657846 Description: 97597 - DEBRIDE WOUND 1ST 20 SQ CM OR < ICD-10 Diagnosis Description L89.610 Pressure ulcer of  right heel, unstageable L89.620 Pressure ulcer of left heel, unstageable Modifier: Quantity: 1 Physician Procedures : CPT4 Code Description Modifier [id number] 99213 - WC PHYS LEVEL 3 - EST PT 25 ICD-10 Diagnosis Description L89.154 Pressure ulcer of sacral region, stage 4 Quantity: 1 : [id number] 97597 - WC PHYS DEBR WO ANESTH 20 SQ CM ICD-10 Diagnosis Description  L89.610 Pressure ulcer of right heel, unstageable L89.620 Pressure ulcer of left heel, unstageable Quantity: 1 Electronic Signature(s) Signed: 07/29/2022 2:17:41 PM By: [name] Corwin DO Entered By: [name] on 07/29/2022 12:21:14

## 2022-07-29 NOTE — Progress Notes (Addendum)
[name], [name] ([id number]) [id number] Page 1 of 7 Visit Report for 07/29/2022 Arrival Information Details Patient Name: Date of Service: [name] [name] 07/29/2022 11:15 A M Medical Record Number: [id number] Patient Account Number: [id number] Date of Birth/Sex: Treating RN: 06/27/1960 (62 y.o. [name], [name] Primary Care Jorma Tassinari: Enid Baas Other Clinician: Referring [name]: Treating [name]: Rica Mote Weeks in Treatment: 12 Visit Information History Since Last Visit Has Dressing in Place as Prescribed: Yes Patient Arrived: Stretcher Pain Present Now: No Arrival Time: 11:19 Accompanied By: s/o Transfer Assistance: Stretcher Patient Identification Verified: Yes Secondary Verification Process Completed: Yes Patient Requires Transmission-Based Precautions: No Patient Has Alerts: Yes Patient Alerts: Patient on Blood Thinner Not Diabetic Xarelto Electronic Signature(s) Signed: 07/29/2022 2:56:04 PM By: Bonnell Public Entered By: Bonnell Public on 07/29/2022 11:19:49 -------------------------------------------------------------------------------- Encounter Discharge Information Details Patient Name: Date of Service: [name] ND, MA [name] 07/29/2022 11:15 A M Medical Record Number: [id number] Patient Account Number: [id number] Date of Birth/Sex: Treating RN: Feb 19, 1961 (62 y.o. [name], [name] Primary Care Lanai Conlee: Enid Baas Other Clinician: Referring [name]: Treating [name]/Extender: Rica Mote Weeks in Treatment: 12 Encounter Discharge Information Items Post Procedure Vitals Discharge Condition: Stable Temperature (F): 98 Ambulatory Status: Stretcher Pulse (bpm): 107 Discharge Destination: Home Respiratory Rate (breaths/min): 20 Transportation: Ambulance Blood Pressure (mmHg): 88/50 Accompanied By: EMS and s/o Schedule Follow-up  Appointment: No Clinical Summary of Care: Electronic Signature(s) Signed: 08/18/2022 11:15:54 AM By: Midge Aver MSN RN CNS WTA Previous Signature: 07/29/2022 2:56:04 PM Version By: Bonnell Public Entered By: Midge Aver on 08/18/2022 11:15:54 -------------------------------------------------------------------------------- Lower Extremity Assessment Details Patient Name: Date of Service: [name] ND, MA [name] 07/29/2022 11:15 A M Medical Record Number: [id number] Patient Account Number: [id number] Date of Birth/Sex: Treating RN: July 01, 1960 (62 y.o. [name], [name] Primary Care Armour Villanueva: Enid Baas Other Clinician: Referring [name]: Treating [name]/Extender: Rica Mote Weeks in Treatment: 12 Edema Assessment Assessed: [Left: No] [Right: No] Edema: [Left: No] [Right: No] [name][LeftLAMARA, [name] ([id number] [Right: [id number] Page 2 of 7] Vascular Assessment Pulses: Dorsalis Pedis Palpable: [Left:Yes] [Right:Yes] Notes abi machine reports unable to calculate d/t weak pulse Electronic Signature(s) Signed: 07/29/2022 2:56:04 PM By: Bonnell Public Entered By: Bonnell Public on 07/29/2022 12:01:11 -------------------------------------------------------------------------------- Multi-Disciplinary Care Plan Details Patient Name: Date of Service: [name] ND, MA [name] 07/29/2022 11:15 A M Medical Record Number: [id number] Patient Account Number: [id number] Date of Birth/Sex: Treating RN: 06/23/1960 (62 y.o. [name], [name] Primary Care Shalon Salado: Enid Baas Other Clinician: Referring Rayvin Abid: Treating [name]/Extender: Rica Mote Weeks in Treatment: 12 Active Inactive Electronic Signature(s) Signed: 07/29/2022 2:56:04 PM By: Bonnell Public Entered By: Bonnell Public on 07/29/2022 13:00:22 -------------------------------------------------------------------------------- Pain  Assessment Details Patient Name: Date of Service: [name] ND, MA [name] 07/29/2022 11:15 A M Medical Record Number: [id number] Patient Account Number: [id number] Date of Birth/Sex: Treating RN: Jun 08, 1960 (62 y.o. [name], [name] Primary Care Tiyonna Sardinha: Enid Baas Other Clinician: Referring [name]: Treating [name]/Extender: Rica Mote Weeks in Treatment: 12 Active Problems Location of Pain Severity and Description of Pain Patient Has Paino Yes Site Locations Pain Location: Generalized Pain Pain Management and Medication Current Pain Management: [name] ([id number]) [phone].pdf Page 3 of 7 Notes generalized pain and ulcer pain secondary to ambulance ride Electronic Signature(s) Signed: 07/29/2022 2:56:04 PM By: Bonnell Public Entered By: Bonnell Public on 07/29/2022 11:20:43 -------------------------------------------------------------------------------- Patient/Caregiver Education Details Patient Name: Date of Service: [name] A NNO [name]  ND, MA [name] 4/17/2024andnbsp11:15 A M Medical Record Number: [id number] Patient Account Number: [id number] Date of Birth/Gender: Treating RN: Apr 14, 1960 (62 y.o. [name], [name] Primary Care Physician: Enid Baas Other Clinician: Referring Physician: Treating Physician/Extender: Rica Mote Weeks in Treatment: 12 Education Assessment Education Provided To: Patient and Caregiver Education Topics Provided Offloading: Handouts: Other: offloading for pressure relief Methods: Explain/Verbal Responses: State content correctly Wound Debridement: Handouts: Wound Debridement Methods: Explain/Verbal Responses: State content correctly Wound/Skin Impairment: Handouts: Caring for Your Ulcer Methods: Explain/Verbal Responses: State content correctly Electronic Signature(s) Signed: 07/29/2022 2:56:04 PM By: Bonnell Public Entered By:  Bonnell Public on 07/29/2022 13:01:06 -------------------------------------------------------------------------------- Wound Assessment Details Patient Name: Date of Service: [name] ND, MA [name] 07/29/2022 11:15 A M Medical Record Number: [id number] Patient Account Number: [id number] Date of Birth/Sex: Treating RN: 13-Jan-1961 (62 y.o. [name], [name] Primary Care Troi Florendo: Enid Baas Other Clinician: Referring Felicite Zeimet: Treating Maxemiliano Riel/Extender: Rica Mote Weeks in Treatment: 12 Wound Status Wound Number: 2 Primary Etiology: Pressure Ulcer Wound Location: Left Calcaneus Wound Status: Open Wounding Event: Pressure Injury Comorbid History: Angina, Hypotension Date Acquired: 03/12/2022 Weeks Of Treatment: 12 Clustered Wound: No Photos [name], [name] ([id number]) [id number] Page 4 of 7 Wound Measurements Length: (cm) 1 Width: (cm) 2 Depth: (cm) 0.3 Area: (cm) 1.571 Volume: (cm) 0.471 % Reduction in Area: 83% % Reduction in Volume: 49% Epithelialization: None Wound Description Classification: Unstageable/Unclassified Exudate Amount: Small Exudate Type: Serous Exudate Color: amber Foul Odor After Cleansing: No Slough/Fibrino Yes Wound Bed Granulation Amount: None Present (0%) Exposed Structure Necrotic Amount: Large (67-100%) Fascia Exposed: No Necrotic Quality: Eschar, Adherent Slough Fat Layer (Subcutaneous Tissue) Exposed: Yes Tendon Exposed: No Muscle Exposed: No Joint Exposed: No Bone Exposed: No Treatment Notes Wound #2 (Calcaneus) Wound Laterality: Left Cleanser Soap and Water Discharge Instruction: Gently cleanse wound with antibacterial soap, rinse and pat dry prior to dressing wounds Wound Cleanser Discharge Instruction: Wash your hands with soap and water. Remove old dressing, discard into plastic bag and place into trash. Cleanse the wound with Wound Cleanser prior to applying a  clean dressing using gauze sponges, not tissues or cotton balls. Do not scrub or use excessive force. Pat dry using gauze sponges, not tissue or cotton balls. Peri-Wound Care Topical Santyl Collagenase Ointment, 30 (gm), tube Discharge Instruction: apply nickel thick to wound bed only Primary Dressing Secondary Dressing Kerlix 4.5 x 4.1 (in/yd) Discharge Instruction: Apply Kerlix 4.5 x 4.1 (in/yd) as instructed Secured With Medipore T - 17M Medipore H Soft Cloth Surgical T ape ape, 2x2 (in/yd) Compression Wrap Compression Stockings Add-Ons ALLEVYN Heel 4 1/2in x 5 1/2in / 10.5cm x 13.5cm Discharge Instruction: Apply the white, patterned surface to the heel. Electronic Signature(s) Signed: 07/29/2022 2:56:04 PM By: Bonnell Public Entered By: Bonnell Public on 07/29/2022 11:48:02 [name] ([id number]) [id number] Page 5 of 7 -------------------------------------------------------------------------------- Wound Assessment Details Patient Name: Date of Service: [name] ND, MA [name] 07/29/2022 11:15 A M Medical Record Number: [id number] Patient Account Number: [id number] Date of Birth/Sex: Treating RN: 08-31-1960 (62 y.o. [name], [name] Primary Care Karmela Bram: Enid Baas Other Clinician: Referring Lakasha Mcfall: Treating Fawne Hughley/Extender: Rica Mote Weeks in Treatment: 12 Wound Status Wound Number: 3 Primary Etiology: Pressure Ulcer Wound Location: Proximal, Midline Sacrum Wound Status: Open Wounding Event: Pressure Injury Comorbid History: Angina, Hypotension Date Acquired: 03/09/2022 Weeks Of Treatment: 12 Clustered Wound: No Photos Wound Measurements Length: (cm) 4.5 Width: (cm) 5 Depth: (cm) 3 Area: (cm) 17.671 Volume: (cm) 53.014 %  Reduction in Area: 25% % Reduction in Volume: 62.5% Epithelialization: None Tunneling: Yes Position (o'clock): 5 Maximum Distance: (cm) 10.5 Undermining:  Yes Starting Position (o'clock): 12 Ending Position (o'clock): 12 Maximum Distance: (cm) 2.5 Wound Description Classification: Category/Stage IV Wound Margin: Epibole Exudate Amount: Large Exudate Type: Serosanguineous Exudate Color: red, brown Foul Odor After Cleansing: No Slough/Fibrino Yes Wound Bed Granulation Amount: Large (67-100%) Exposed Structure Granulation Quality: Red, Hyper-granulation Fascia Exposed: No Necrotic Amount: Small (1-33%) Fat Layer (Subcutaneous Tissue) Exposed: Yes Tendon Exposed: No Muscle Exposed: Yes Necrosis of Muscle: No Joint Exposed: No Bone Exposed: No Treatment Notes Wound #3 (Sacrum) Wound Laterality: Midline, Proximal Cleanser Dakin 16 (oz) 0.25 Discharge Instruction: Use as directed. Soap and Water Discharge Instruction: Gently cleanse wound with antibacterial soap, rinse and pat dry prior to dressing wounds [name], [name] ([id number]) [id number] Page 6 of 7 Peri-Wound Care Topical Primary Dressing Secondary Dressing Kerlix 4.5 x 4.1 (in/yd) Discharge Instruction: lightly packed into wound , moistened with dakins 1/4 solution Secured With Bordered foam dressing. Compression Wrap Compression Stockings Add-Ons Electronic Signature(s) Signed: 07/29/2022 2:56:04 PM By: Bonnell Public Entered By: Bonnell Public on 07/29/2022 11:48:26 -------------------------------------------------------------------------------- Wound Assessment Details Patient Name: Date of Service: [name] ND, MA [name] 07/29/2022 11:15 A M Medical Record Number: [id number] Patient Account Number: [id number] Date of Birth/Sex: Treating RN: 31-Aug-1960 (62 y.o. [name], [name] Primary Care Tita Terhaar: Enid Baas Other Clinician: Referring Coston Mandato: Treating Juliett Eastburn/Extender: Rica Mote Weeks in Treatment: 12 Wound Status Wound Number: 5 Primary Etiology: Pressure Ulcer Wound Location:  Right, Lateral Ankle Wound Status: Open Wounding Event: Gradually Appeared Comorbid History: Angina, Hypotension Date Acquired: 06/22/2022 Weeks Of Treatment: 4 Clustered Wound: No Photos Wound Measurements Length: (cm) 1.5 Width: (cm) 0.5 Depth: (cm) 0.2 Area: (cm) 0.589 Volume: (cm) 0.118 % Reduction in Area: 0% % Reduction in Volume: -100% Epithelialization: None Wound Description Classification: Unstageable/Unclassified Exudate Amount: None Present Foul Odor After Cleansing: No Slough/Fibrino Yes Wound Bed Granulation Amount: None Present (0%) Exposed Structure Necrotic Amount: Large (67-100%) Fat Layer (Subcutaneous Tissue) Exposed: Yes Necrotic Quality: Eschar Treatment Notes [name], [name] ([id number]) [phone].pdf Page 7 of 7 Wound #5 (Ankle) Wound Laterality: Right, Lateral Cleanser Soap and Water Discharge Instruction: Gently cleanse wound with antibacterial soap, rinse and pat dry prior to dressing wounds Wound Cleanser Discharge Instruction: Wash your hands with soap and water. Remove old dressing, discard into plastic bag and place into trash. Cleanse the wound with Wound Cleanser prior to applying a clean dressing using gauze sponges, not tissues or cotton balls. Do not scrub or use excessive force. Pat dry using gauze sponges, not tissue or cotton balls. Peri-Wound Care Topical Santyl Collagenase Ointment, 30 (gm), tube Discharge Instruction: apply nickel thick to wound bed only Primary Dressing Secondary Dressing Kerlix 4.5 x 4.1 (in/yd) Discharge Instruction: Apply Kerlix 4.5 x 4.1 (in/yd) as instructed Secured With Medipore T - 45M Medipore H Soft Cloth Surgical T ape ape, 2x2 (in/yd) Compression Wrap Compression Stockings Add-Ons ALLEVYN Heel 4 1/2in x 5 1/2in / 10.5cm x 13.5cm Discharge Instruction: Apply the white, patterned surface to the heel. Electronic Signature(s) Signed: 07/29/2022 2:56:04 PM By: Bonnell Public Entered By: Bonnell Public on 07/29/2022 12:00:37 -------------------------------------------------------------------------------- Vitals Details Patient Name: Date of Service: [name] ND, MA [name] 07/29/2022 11:15 A M Medical Record Number: [id number] Patient Account Number: [id number] Date of Birth/Sex: Treating RN: 01-03-1961 (62 y.o. [name], [name] Primary Care Donnavin Vandenbrink: Enid Baas Other Clinician: Referring Lajeana Strough: Treating Parminder Cupples/Extender: Mikey Bussing  Prudy Feeler, Radhika Weeks in Treatment: 12 Vital Signs Time Taken: 11:19 Temperature (F): 98 Pulse (bpm): 107 Respiratory Rate (breaths/min): 18 Blood Pressure (mmHg): 88/50 Reference Range: 80 - 120 mg / dl Electronic Signature(s) Signed: 07/29/2022 2:56:04 PM By: Bonnell Public Entered By: Bonnell Public on 07/29/2022 11:20:16

## 2022-07-30 ENCOUNTER — Encounter: Payer: Self-pay | Admitting: Infectious Diseases

## 2022-08-03 ENCOUNTER — Other Ambulatory Visit: Payer: Medicare Other

## 2022-08-03 VITALS — BP 106/78 | HR 100 | Temp 97.4°F

## 2022-08-03 DIAGNOSIS — Z515 Encounter for palliative care: Secondary | ICD-10-CM

## 2022-08-03 NOTE — Progress Notes (Signed)
PATIENT NAME: [name] DOB: [birth year] MRN: [id number]  PRIMARY CARE PROVIDER: Enid Baas, MD  RESPONSIBLE PARTY:  Acct ID - Guarantor Home Phone Work Phone Relationship Acct Type  [id number] [name]* [phone]  Self P/F     [address], [place], [place] [address]   Home visit completed with patient.   Appetite:  Remains good.  [name] continues with meal prep and assisting patient when needed.   MS:  No new issues to report.  Patient waiting to hear from neurology for next infusion session.   Home Health:  Continues to be followed by [facility] OT and nursing.   Pain:  Sacral regions when dressing changes occur and if she hits this area.  Other wise, she is pain free.    Motorized Wheelchair:  Currently has orders for a new motorized wheelchair that will tilt. Patient believes it will be ready in another 1-2 months.    Sacral Wound:  Continues to be managed by the wound clinic and home health.  [name] is managing all other days.  Only able to sit up for about 1 1/2 hours each day.  Other wise, has been told to remain in the bed and off load wound.   CODE STATUS: DNR  ADVANCED DIRECTIVES: Yes MOST FORM: No PPS: 30%   PHYSICAL EXAM:   VITALS: Today's Vitals   08/03/22 1355  BP: 106/78  Pulse: 100  Temp: (!) 97.4 F (36.3 C)  SpO2: 93%    LUNGS: clear to auscultation  CARDIAC: Cor RRR}  EXTREMITIES: - for edema SKIN: Skin color, texture, turgor normal. No rashes or lesions or normal and Sacral wound being managed by wound clinic and home health.   NEURO: positive for gait problems       [name], RN

## 2022-08-31 ENCOUNTER — Other Ambulatory Visit: Payer: Medicare Other

## 2022-08-31 VITALS — BP 110/64 | HR 114 | Temp 97.4°F

## 2022-08-31 DIAGNOSIS — Z515 Encounter for palliative care: Secondary | ICD-10-CM

## 2022-08-31 NOTE — Progress Notes (Signed)
PATIENT NAME: [name] DOB: 04/28/1960 MRN: [id number]  PRIMARY CARE PROVIDER: Enid Baas, MD  RESPONSIBLE PARTY:  Acct ID - Guarantor Home Phone Work Phone Relationship Acct Type  [id number] Tress Molter* [phone]  Self P/F     [address] [place], [place] [address]    Home visit completed with patient and SO.   Appetite:  Patient was advised to increase protein intake by wound clinic. She has difficulty consuming large amounts of premier supplement drink and is trying to take in more foods high in protein.   Sacral/Bilateral Heel Wounds:  Was seen in [place] by wound specialist who recommended a wound vac.  Will be seen this week by [facility] to see if wound vac may possibly be started.   Home Health:  Continues with [facility].  OT is assisting with wheelchair and hopefull that will be in place in the next few weeks.  Nursing is still in place for wound care.   Pain:  Better managed with use of tramadol.  Patient reports using this 1-2 x daily as needed.   Oncology:  Patient uncertain if she needs to follow up with oncology.  She has not heard back about her blood work.  Patient advised to contact office to follow up.     CODE STATUS: DNR ADVANCED DIRECTIVES: Yes MOST FORM: No PPS: 40%   PHYSICAL EXAM:   VITALS: Today's Vitals   08/31/22 1542  BP: 110/64  Pulse: (!) 114  Temp: (!) 97.4 F (36.3 C)  SpO2: 92%    LUNGS: clear to auscultation  CARDIAC: Cor Tachy}  EXTREMITIES: Trace edema SKIN: Skin color, texture, turgor normal. No rashes or lesions or see above for wounds.   NEURO: positive for gait problems       [name], RN

## 2022-09-02 ENCOUNTER — Encounter: Payer: Medicare Other | Attending: Internal Medicine | Admitting: Internal Medicine

## 2022-09-02 DIAGNOSIS — L89620 Pressure ulcer of left heel, unstageable: Secondary | ICD-10-CM

## 2022-09-02 DIAGNOSIS — Z7901 Long term (current) use of anticoagulants: Secondary | ICD-10-CM | POA: Insufficient documentation

## 2022-09-02 DIAGNOSIS — M4628 Osteomyelitis of vertebra, sacral and sacrococcygeal region: Secondary | ICD-10-CM | POA: Insufficient documentation

## 2022-09-02 DIAGNOSIS — Z7401 Bed confinement status: Secondary | ICD-10-CM | POA: Diagnosis not present

## 2022-09-02 DIAGNOSIS — G35 Multiple sclerosis: Secondary | ICD-10-CM | POA: Diagnosis not present

## 2022-09-02 DIAGNOSIS — L89523 Pressure ulcer of left ankle, stage 3: Secondary | ICD-10-CM | POA: Diagnosis not present

## 2022-09-02 DIAGNOSIS — L89154 Pressure ulcer of sacral region, stage 4: Secondary | ICD-10-CM

## 2022-09-02 DIAGNOSIS — L89510 Pressure ulcer of right ankle, unstageable: Secondary | ICD-10-CM

## 2022-09-02 DIAGNOSIS — Z86711 Personal history of pulmonary embolism: Secondary | ICD-10-CM | POA: Insufficient documentation

## 2022-09-02 DIAGNOSIS — Z86718 Personal history of other venous thrombosis and embolism: Secondary | ICD-10-CM | POA: Diagnosis not present

## 2022-09-16 ENCOUNTER — Encounter: Payer: Medicare Other | Attending: Internal Medicine | Admitting: Internal Medicine

## 2022-09-16 DIAGNOSIS — L89620 Pressure ulcer of left heel, unstageable: Secondary | ICD-10-CM | POA: Insufficient documentation

## 2022-09-16 DIAGNOSIS — Z8619 Personal history of other infectious and parasitic diseases: Secondary | ICD-10-CM | POA: Diagnosis not present

## 2022-09-16 DIAGNOSIS — G35 Multiple sclerosis: Secondary | ICD-10-CM | POA: Diagnosis not present

## 2022-09-16 DIAGNOSIS — Z87440 Personal history of urinary (tract) infections: Secondary | ICD-10-CM | POA: Diagnosis not present

## 2022-09-16 DIAGNOSIS — Z7401 Bed confinement status: Secondary | ICD-10-CM | POA: Diagnosis not present

## 2022-09-16 DIAGNOSIS — M4628 Osteomyelitis of vertebra, sacral and sacrococcygeal region: Secondary | ICD-10-CM | POA: Insufficient documentation

## 2022-09-16 DIAGNOSIS — Z7901 Long term (current) use of anticoagulants: Secondary | ICD-10-CM | POA: Insufficient documentation

## 2022-09-16 DIAGNOSIS — L89154 Pressure ulcer of sacral region, stage 4: Secondary | ICD-10-CM | POA: Insufficient documentation

## 2022-09-16 DIAGNOSIS — Z86711 Personal history of pulmonary embolism: Secondary | ICD-10-CM | POA: Diagnosis not present

## 2022-09-16 DIAGNOSIS — Z86718 Personal history of other venous thrombosis and embolism: Secondary | ICD-10-CM | POA: Insufficient documentation

## 2022-09-16 DIAGNOSIS — L89523 Pressure ulcer of left ankle, stage 3: Secondary | ICD-10-CM | POA: Diagnosis not present

## 2022-09-23 ENCOUNTER — Encounter (HOSPITAL_BASED_OUTPATIENT_CLINIC_OR_DEPARTMENT_OTHER): Payer: Medicare Other | Admitting: Internal Medicine

## 2022-09-23 DIAGNOSIS — L89620 Pressure ulcer of left heel, unstageable: Secondary | ICD-10-CM | POA: Diagnosis not present

## 2022-09-23 DIAGNOSIS — L89510 Pressure ulcer of right ankle, unstageable: Secondary | ICD-10-CM

## 2022-09-23 DIAGNOSIS — L89523 Pressure ulcer of left ankle, stage 3: Secondary | ICD-10-CM

## 2022-09-23 DIAGNOSIS — L89154 Pressure ulcer of sacral region, stage 4: Secondary | ICD-10-CM

## 2022-09-24 ENCOUNTER — Encounter (INDEPENDENT_AMBULATORY_CARE_PROVIDER_SITE_OTHER): Payer: Medicare Other

## 2022-09-24 ENCOUNTER — Encounter (INDEPENDENT_AMBULATORY_CARE_PROVIDER_SITE_OTHER): Payer: Medicare Other | Admitting: Nurse Practitioner

## 2022-09-25 ENCOUNTER — Encounter: Payer: Self-pay | Admitting: Oncology

## 2022-09-25 NOTE — Progress Notes (Addendum)
TAMEKIA, POTOSKY ([id number]) [id number] Page 1 of 9 Visit Report for 09/23/2022 Chief Complaint Document Details Patient Name: Date of Service: [name], [name] 09/23/2022 1:45 PM Medical Record Number: [id number] Patient Account Number: [id number] Date of Birth/Sex: Treating RN: 1960-09-09 (62 y.o. [name] Primary Care Provider: Enid Baas Other Clinician: Betha Loa Referring Provider: Treating Provider/Extender: Rica Mote Weeks in Treatment: 20 Information Obtained from: Patient Chief Complaint 05/06/2022; Bilateral feet wounds and sacral wounds Electronic Signature(s) Signed: 09/23/2022 4:14:13 PM By: Geralyn Corwin DO Entered By: Geralyn Corwin on 09/23/2022 14:26:53 -------------------------------------------------------------------------------- HPI Details Patient Name: Date of Service: [name], [name] 09/23/2022 1:45 PM Medical Record Number: [id number] Patient Account Number: [id number] Date of Birth/Sex: Treating RN: [birth year] (62 y.o. [name] Primary Care Provider: Enid Baas Other Clinician: Betha Loa Referring Provider: Treating Provider/Extender: Rica Mote Weeks in Treatment: 20 History of Present Illness HPI Description: 05/06/2022 Ms. Janece Mceachin is a 62 year old female with a past medical history of multiple sclerosis and PEs and DVT that presents to the clinic for sacral s wounds and Bilateral feet wounds. Patient states that she obtained the sacral ulcers when she was hospitalized in November 2023 for bilateral pulmonary embolisms. She was in the ICU on a ventilator. Since discharge she has been using Dakin's wet-to-dry dressings to the area. Since her hospitalization she has been bedbound. However she is doing physical therapy. Prior to being hospitalized she was able to pull herself up and transfer from  bed to chair on her own. Due to her MS she is not fully mobile. Unfortunately she states that the wound has gotten larger on her sacrum despite Wound care. Patient has home health. On 04/16/2022 she was admitted to the hospital for severe sepsis secondary to acute UTI and sacral cellulitis. She completed 5 days of IV cefepime and vancomycin. She was not discharged with oral antibiotics. She states she developed the bilateral feet wounds at that time. She states she has bunny boots however she is not wearing them. She currently denies systemic signs of infection. 2/7; patient presents for follow-up. She had a bone biopsy done at last clinic visit that showed fragments of inflamed granulation tissue, necrotic material and bone with acute osteomyelitis. Culture grew Proteus mirabilis and Klebsiella pneumoniae. Patient is scheduled to see infectious disease next week. For now she has been doing Dakin's wet-to-dry dressings to the sacrum. She is not able to obtain Santyl and has been keeping the left heel covered. T the right foot where o there was a previous wound with scab however this has healed. This has remained closed. No open wound noted. She states over the past week she has had chills but no fevers, nausea/vomiting or purulent drainage. 2/21; patient presents for follow-up. She saw Dr. Joylene Draft on 2/15. 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 Today she had feces JENEFER, KNICELEY ([id number]) [id number] Page 2 of 9 impacted in the wound and throughout the tunneling. We discussed a diverting colostomy to address this issue. She was agreeable with a referral to general surgery. She currently denies systemic signs of infection. She has been using Medihoney and Hydrofera Blue to the left heel wound. She has been using her Prevalon boots. 3/20; patient presents for follow-up. She has been using Dakin's wet-to-dry dressings to the sacral wound.  She has developed a new wound to her right lateral heel. She is using Medihoney and Hydrofera Blue to the  left heel. She is currently taking Levaquin to complete 6 weeks. She follows with infectious disease for this. She saw general surgery at [facility] to discuss potentially doing a diverting colostomy however per patient's surgeon did not recommend this. 4/17; patient presents for follow-up. Has been using Dakin's wet-to-dry dressings to the sacral wound. She has been using Medihoney and Hydrofera Blue to the heel wounds. She states she is using her bunny boots for offloading the heels. She has not heard from plastic surgery for consultation. We gave her the number to call to follow this up. 5/22; patient presents for follow-up. She saw Dr. Ferd Hibbs on 5/8, plastic surgery to discuss potentially doing surgical debridement with muscle flap. Per Dr. Timothy Lasso note Review of the surgery and requirements were discussed with the patient. Patient would like to hold off on proceeding with this option. Wound VAC was recommended and based on the wound today this option seemed reasonable to get started. Patient has home health that will be able to change the wound VAC. She has been using Dakin's wet-to-dry dressings to the sacrum. Patient has a new wound to the left medial ankle. She states she wears bunny boots but does not while in the wheelchair. 6/1; the patient has started the wound VAC as of Monday to the sacral area. Home health is out to change the dressing. She also has wounds on her bilateral ankles and left heel she is using Medihoney and Hydrofera Blue to these areas. She has advanced MS with contractures. Both the patient and her husband say she is eating a high-protein dilated well. She has some form of air mattress but the bed is from sleep number. I am uncertain about the offloading part of this 6/12; patient presents for follow-up. She has had a wound VAC to the sacral wound and home health has  been changing this. She has had no issues with this. She has been using Hydrofera Blue and Medihoney to the bilateral ankle and left heel wounds. The right ankle wound is healed. Electronic Signature(s) Signed: 09/23/2022 4:14:13 PM By: Geralyn Corwin DO Entered By: Geralyn Corwin on 09/23/2022 14:27:43 -------------------------------------------------------------------------------- Physical Exam Details Patient Name: Date of Service: [name], [name] 09/23/2022 1:45 PM Medical Record Number: [id number] Patient Account Number: [id number] Date of Birth/Sex: Treating RN: 1960/06/28 (62 y.o. [name] Primary Care Provider: Enid Baas Other Clinician: Betha Loa Referring Provider: Treating Provider/Extender: Rica Mote Weeks in Treatment: 20 Constitutional . Cardiovascular . Psychiatric . Notes T the sacrum there is granulation tissue with no exposed bone. Undermining from 16:00. No signs of surrounding soft tissue infection. The right ankle wound o is healed. She still has an open wound to the left heel and left ankle with granulation tissue and scant nonviable tissue. No signs of surrounding infection here as well. Electronic Signature(s) Signed: 09/23/2022 4:14:13 PM By: Geralyn Corwin DO Entered By: Geralyn Corwin on 09/23/2022 14:28:32 [name] ([id number]) [id number] Page 3 of 9 -------------------------------------------------------------------------------- Physician Orders Details Patient Name: Date of Service: [name], [name] 09/23/2022 1:45 PM Medical Record Number: [id number] Patient Account Number: [id number] Date of Birth/Sex: Treating RN: 1960-08-27 (62 y.o. [name] Primary Care Provider: Enid Baas Other Clinician: Betha Loa Referring Provider: Treating Provider/Extender: Rica Mote Weeks in Treatment:  20 Verbal / Phone Orders: Yes Clinician: Midge Aver Read Back and Verified: Yes Diagnosis Coding ICD-10 Coding Code Description L89.154 Pressure ulcer of sacral region, stage 4 L89.620  Pressure ulcer of left heel, unstageable L89.510 Pressure ulcer of right ankle, unstageable L89.523 Pressure ulcer of left ankle, stage 3 I26.99 Other pulmonary embolism without acute cor pulmonale 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, sacral and sacrococcygeal region G35 Multiple sclerosis I82.409 Acute embolism and thrombosis of unspecified deep veins of unspecified lower extremity Z79.01 Long term (current) use of anticoagulants Home Health CONTINUE Home Health for wound care. May utilize formulary equivalent dressing for wound treatment orders unless otherwise specified. Home Health Nurse may visit PRN to address patients wound care needs. - ADORATION **Please direct any NON-WOUND related issues/requests for orders to patient's Primary Care Physician. **If current dressing causes regression in wound condition, may D/C ordered dressing product/s and apply Normal Saline Moist Dressing daily until next Wound Healing Center or Other MD appointment. **Notify Wound Healing Center of regression in wound condition at [phone]. Negative Pressure Wound Therapy Wound #3 Proximal,Midline Sacrum Wound VAC settings at continuous pressure. Use foam to wound cavity. Please order WHITE foam to fill any tunnel/s and/or undermining when necessary. Change VAC dressing 3 X WEEK. Change canister as indicated when full. - white foam in tunnels followed by black foam , Wound vac to be applied by home health nurse for Adoration, when patient is seen in clinic wound vac will be removed and replaced with wet to dry dressing , home health to reapply wound vac that same day . Home Health Nurse may d/c VAC for s/s of increased infection, significant wound regression, or uncontrolled drainage. Notify Wound Healing Center  at [phone]. - ADORATION Wound Treatment Wound #2 - Calcaneus Wound Laterality: Left Cleanser: Soap and Water 3 x Per Week/30 Days Discharge Instructions: Gently cleanse wound with antibacterial soap, rinse and pat dry prior to dressing wounds Topical: Santyl Collagenase Ointment, 30 (gm), tube 3 x Per Week/30 Days Discharge Instructions: apply nickel thick to wound bed only Prim Dressing: Hydrofera Blue Ready Transfer Foam, 2.5x2.5 (in/in) 3 x Per Week/30 Days ary Discharge Instructions: Apply Hydrofera Blue Ready to wound bed as directed Prim Dressing: Zetuvit Plus 4x4 (in/in) ary 3 x Per Week/30 Days Wound #3 - Sacrum Wound Laterality: Midline, Proximal Cleanser: Vashe 5.8 (oz) 3 x Per Week/30 Days Discharge Instructions: Use vashe 5.8 (oz) as directed Add-Ons: Wound Vac 3 x Per Week/30 Days Wound #6 - Ankle Wound Laterality: Left, Medial Cleanser: Soap and Water 3 x Per Week/30 Days TEIARA, JOHN ([id number]) [id number] Page 4 of 9 Discharge Instructions: Gently cleanse wound with antibacterial soap, rinse and pat dry prior to dressing wounds Topical: Activon Honey Gel, 25 (g) Tube 3 x Per Week/30 Days Prim Dressing: Hydrofera Blue Ready Transfer Foam, 2.5x2.5 (in/in) 3 x Per Week/30 Days ary Discharge Instructions: Apply Hydrofera Blue Ready to wound bed as directed Prim Dressing: Zetuvit Plus 4x4 (in/in) ary 3 x Per Week/30 Days Electronic Signature(s) Signed: 09/23/2022 4:43:31 PM By: Betha Loa Signed: 09/24/2022 9:25:41 AM By: Geralyn Corwin DO Entered By: Betha Loa on 09/23/2022 16:33:52 -------------------------------------------------------------------------------- Problem List Details Patient Name: Date of Service: [name], [name] 09/23/2022 1:45 PM Medical Record Number: [id number] Patient Account Number: [id number] Date of Birth/Sex: Treating RN: May 08, 1960 (62 y.o. [name] Primary Care Provider:  Enid Baas Other Clinician: Betha Loa Referring Provider: Treating Provider/Extender: Rica Mote Weeks in Treatment: 20 Active Problems ICD-10 Encounter Code Description Active Date MDM Diagnosis L89.154 Pressure ulcer of sacral region, stage 4 05/06/2022 No Yes L89.620 Pressure ulcer of left heel, unstageable 05/06/2022 No Yes L89.510  Pressure ulcer of right ankle, unstageable 09/02/2022 No Yes L89.523 Pressure ulcer of left ankle, stage 3 09/02/2022 No Yes I26.99 Other pulmonary embolism without acute cor pulmonale 05/06/2022 No Yes 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, sacral and sacrococcygeal region 05/20/2022 No Yes G35 Multiple sclerosis 05/06/2022 No Yes I82.409 Acute embolism and thrombosis of unspecified deep veins of unspecified 05/06/2022 No Yes lower extremity CYDNEY, LAFLAMME ([id number]) [id number] Page 5 of 9 Z79.01 Long term (current) use of anticoagulants 05/06/2022 No Yes Inactive Problems ICD-10 Code Description Active Date Inactive Date L89.153 Pressure ulcer of sacral region, stage 3 05/06/2022 05/06/2022 Resolved Problems Electronic Signature(s) Signed: 09/23/2022 4:14:13 PM By: Geralyn Corwin DO Entered By: Geralyn Corwin on 09/23/2022 14:26:50 -------------------------------------------------------------------------------- Progress Note Details Patient Name: Date of Service: [name], [name] 09/23/2022 1:45 PM Medical Record Number: [id number] Patient Account Number: [id number] Date of Birth/Sex: Treating RN: 1960/11/10 (62 y.o. [name] Primary Care Provider: Enid Baas Other Clinician: Betha Loa Referring Provider: Treating Provider/Extender: Rica Mote Weeks in Treatment: 20 Subjective Chief Complaint Information obtained from Patient 05/06/2022; Bilateral feet wounds and sacral wounds History of Present Illness (HPI) 05/06/2022 Ms.  Colie Bakeman is a 62 year old female with a past medical history of multiple sclerosis and PEs and DVT that presents to the clinic for sacral s wounds and Bilateral feet wounds. Patient states that she obtained the sacral ulcers when she was hospitalized in November 2023 for bilateral pulmonary embolisms. She was in the ICU on a ventilator. Since discharge she has been using Dakin's wet-to-dry dressings to the area. Since her hospitalization she has been bedbound. However she is doing physical therapy. Prior to being hospitalized she was able to pull herself up and transfer from bed to chair on her own. Due to her MS she is not fully mobile. Unfortunately she states that the wound has gotten larger on her sacrum despite Wound care. Patient has home health. On 04/16/2022 she was admitted to the hospital for severe sepsis secondary to acute UTI and sacral cellulitis. She completed 5 days of IV cefepime and vancomycin. She was not discharged with oral antibiotics. She states she developed the bilateral feet wounds at that time. She states she has bunny boots however she is not wearing them. She currently denies systemic signs of infection. 2/7; patient presents for follow-up. She had a bone biopsy done at last clinic visit that showed fragments of inflamed granulation tissue, necrotic material and bone with acute osteomyelitis. Culture grew Proteus mirabilis and Klebsiella pneumoniae. Patient is scheduled to see infectious disease next week. For now she has been doing Dakin's wet-to-dry dressings to the sacrum. She is not able to obtain Santyl and has been keeping the left heel covered. T the right foot where o there was a previous wound with scab however this has healed. This has remained closed. No open wound noted. She states over the past week she has had chills but no fevers, nausea/vomiting or purulent drainage. 2/21; patient presents for follow-up. She saw Dr. Joylene Draft on 2/15. 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 Today she had feces impacted in the wound and throughout the tunneling. We discussed a diverting colostomy to address this issue. She was agreeable with a referral to general surgery. She currently denies systemic signs of infection. She has been using Medihoney and Hydrofera Blue to the left heel wound. She has been using her Prevalon boots. 3/20; patient presents for follow-up. She has been using Dakin's wet-to-dry  dressings to the sacral wound. She has developed a new wound to her right lateral heel. She is using Medihoney and Hydrofera Blue to the left heel. She is currently taking Levaquin to complete 6 weeks. She follows with infectious disease for this. She saw general surgery at [facility] to discuss potentially doing a diverting colostomy however per patient's surgeon did not recommend this. 4/17; patient presents for follow-up. Has been using Dakin's wet-to-dry dressings to the sacral wound. She has been using Medihoney and Hydrofera Blue to the heel wounds. She states she is using her bunny boots for offloading the heels. She has not heard from plastic surgery for consultation. We gave her the number to call to follow this up. LUZIA, HACKER ([id number]) [id number] Page 6 of 9 5/22; patient presents for follow-up. She saw Dr. Ferd Hibbs on 5/8, plastic surgery to discuss potentially doing surgical debridement with muscle flap. Per Dr. Timothy Lasso note Review of the surgery and requirements were discussed with the patient. Patient would like to hold off on proceeding with this option. Wound VAC was recommended and based on the wound today this option seemed reasonable to get started. Patient has home health that will be able to change the wound VAC. She has been using Dakin's wet-to-dry dressings to the sacrum. Patient has a new wound to the left medial ankle. She states she wears bunny boots but does not while  in the wheelchair. 6/1; the patient has started the wound VAC as of Monday to the sacral area. Home health is out to change the dressing. She also has wounds on her bilateral ankles and left heel she is using Medihoney and Hydrofera Blue to these areas. She has advanced MS with contractures. Both the patient and her husband say she is eating a high-protein dilated well. She has some form of air mattress but the bed is from sleep number. I am uncertain about the offloading part of this 6/12; patient presents for follow-up. She has had a wound VAC to the sacral wound and home health has been changing this. She has had no issues with this. She has been using Hydrofera Blue and Medihoney to the bilateral ankle and left heel wounds. The right ankle wound is healed. Patient History Information obtained from Patient. Social History Never smoker, Marital Status - Widowed, Alcohol Use - Never, Caffeine Use - Daily. Medical History Cardiovascular Patient has history of Angina, Hypotension Endocrine Denies history of Type II Diabetes Integumentary (Skin) Denies history of History of pressure wounds Medical A Surgical History Notes nd Respiratory pulmonary hemorrage Neurologic MS Objective Constitutional Vitals Time Taken: 1:49 PM, Temperature: 98.2 F, Pulse: 111 bpm, Respiratory Rate: 16 breaths/min, Blood Pressure: 102/63 mmHg. General Notes: T the sacrum there is granulation tissue with no exposed bone. Undermining from 16:00. No signs of surrounding soft tissue infection. The o right ankle wound is healed. She still has an open wound to the left heel and left ankle with granulation tissue and scant nonviable tissue. No signs of surrounding infection here as well. Integumentary (Hair, Skin) Wound #2 status is Open. Original cause of wound was Pressure Injury. The date acquired was: 03/12/2022. The wound has been in treatment 20 weeks. The wound is located on the Left Calcaneus. The wound  measures 0.1cm length x 0.1cm width x 0.1cm depth; 0.008cm^2 area and 0.001cm^3 volume. There is Fat Layer (Subcutaneous Tissue) exposed. There is a small amount of serous drainage noted. There is no granulation within the wound bed. There is a large (67-  100%) amount of necrotic tissue within the wound bed including Eschar and Adherent Slough. Wound #3 status is Open. Original cause of wound was Pressure Injury. The date acquired was: 03/09/2022. The wound has been in treatment 20 weeks. The wound is located on the Proximal,Midline Sacrum. The wound measures 3cm length x 1.6cm width x 0.8cm depth; 3.77cm^2 area and 3.016cm^3 volume. There is muscle and Fat Layer (Subcutaneous Tissue) exposed. There is undermining starting at 12:00 and ending at 12:00 with a maximum distance of 2.4cm. There is a large amount of serosanguineous drainage noted. The wound margin is epibole. There is large (67-100%) red, hyper - granulation within the wound bed. There is a small (1-33%) amount of necrotic tissue within the wound bed. Wound #5 status is Healed - Epithelialized. Original cause of wound was Gradually Appeared. The date acquired was: 06/22/2022. The wound has been in treatment 12 weeks. The wound is located on the Right,Lateral Ankle. The wound measures 0cm length x 0cm width x 0cm depth; 0cm^2 area and 0cm^3 volume. There is Fat Layer (Subcutaneous Tissue) exposed. There is a none present amount of drainage noted. There is no granulation within the wound bed. There is no necrotic tissue within the wound bed. Wound #6 status is Open. Original cause of wound was Pressure Injury. The date acquired was: 08/12/2022. The wound has been in treatment 3 weeks. The wound is located on the Left,Medial Ankle. The wound measures 0.5cm length x 0.7cm width x 0.1cm depth; 0.275cm^2 area and 0.027cm^3 volume. There is Fat Layer (Subcutaneous Tissue) exposed. There is a medium amount of serosanguineous drainage noted. There is  medium (34-66%) red granulation within the wound bed. There is a medium (34-66%) amount of necrotic tissue within the wound bed including Adherent Slough. Assessment Active Problems ICD-10 Pressure ulcer of sacral region, stage 4 Pressure ulcer of left heel, unstageable Pressure ulcer of right ankle, unstageable Pressure ulcer of left ankle, stage 3 AJSA, GODIN ([id number]) [id number] Page 7 of 9 Other pulmonary embolism without acute cor pulmonale Osteomyelitis of vertebra, sacral and sacrococcygeal region Multiple sclerosis Acute embolism and thrombosis of unspecified deep veins of unspecified lower extremity Long term (current) use of anticoagulants Patient's wounds have shown improvement in size and appearance since last clinic visit. Her right ankle wound is healed. I recommended continuing with Hydrofera Blue and Medihoney to the remaining wounds on her left foot. The sacral wound has now no exposed bone. Continue the wound VAC. Continue aggressive offloading. The patient has a sleep number bed and has declined an airflow mattress. Plan Home Health: [facility] for wound care. May utilize formulary equivalent dressing for wound treatment orders unless otherwise specified. Home Health Nurse may visit PRN to address patients wound care needs. - ADORATION **Please direct any NON-WOUND related issues/requests for orders to patient's Primary Care Physician. **If current dressing causes regression in wound condition, may D/C ordered dressing product/s and apply Normal Saline Moist Dressing daily until next Wound Healing Center or Other MD appointment. **Notify Wound Healing Center of regression in wound condition at [phone]. Negative Pressure Wound Therapy: Wound #3 Proximal,Midline Sacrum: Wound VAC settings at continuous pressure. Use foam to wound cavity. Please order WHITE foam to fill any tunnel/s and/or undermining  when necessary. Change VAC dressing 3 X WEEK. Change canister as indicated when full. - white foam in tunnels followed by black foam , Wound vac to be applied by home health nurse for Adoration, when patient is seen in clinic wound vac  will be removed and replaced with wet to dry dressing , home health to reapply wound vac that same day . Home Health Nurse may d/c VAC for s/s of increased infection, significant wound regression, or uncontrolled drainage. Notify Wound Healing Center at [phone]. - ADORATION WOUND #2: - Calcaneus Wound Laterality: Left Cleanser: Soap and Water 3 x Per Week/30 Days Discharge Instructions: Gently cleanse wound with antibacterial soap, rinse and pat dry prior to dressing wounds Topical: Santyl Collagenase Ointment, 30 (gm), tube 3 x Per Week/30 Days Discharge Instructions: apply nickel thick to wound bed only Prim Dressing: Hydrofera Blue Ready Transfer Foam, 2.5x2.5 (in/in) 3 x Per Week/30 Days ary Discharge Instructions: Apply Hydrofera Blue Ready to wound bed as directed Prim Dressing: Zetuvit Plus 4x4 (in/in) 3 x Per Week/30 Days ary WOUND #3: - Sacrum Wound Laterality: Midline, Proximal Cleanser: Vashe 5.8 (oz) 3 x Per Week/30 Days Discharge Instructions: Use vashe 5.8 (oz) as directed Add-Ons: Wound Vac 3 x Per Week/30 Days WOUND #6: - Ankle Wound Laterality: Left, Medial Cleanser: Soap and Water 3 x Per Week/30 Days Discharge Instructions: Gently cleanse wound with antibacterial soap, rinse and pat dry prior to dressing wounds Topical: Activon Honey Gel, 25 (g) Tube 3 x Per Week/30 Days Prim Dressing: Hydrofera Blue Ready Transfer Foam, 2.5x2.5 (in/in) 3 x Per Week/30 Days ary Discharge Instructions: Apply Hydrofera Blue Ready to wound bed as directed Prim Dressing: Zetuvit Plus 4x4 (in/in) 3 x Per Week/30 Days ary 1. Continue wound VAC 2. Hydrofera Blue and Medihoney 3. Aggressive offloading 4. Follow-up in 2 weeks Electronic  Signature(s) Signed: 10/12/2022 3:41:32 PM By: Geralyn Corwin DO Previous Signature: 09/23/2022 4:14:13 PM Version By: Geralyn Corwin DO Entered By: Geralyn Corwin on 09/28/2022 14:39:48 -------------------------------------------------------------------------------- ROS/PFSH Details Patient Name: Date of Service: [name], [name] 09/23/2022 1:45 PM Medical Record Number: [id number] Patient Account Number: [id number] Date of Birth/Sex: Treating RN: [birth year] (62 y.o. [name] Primary Care Provider: Enid Baas Other Clinician: Olie, Teat ([id number]) [phone].pdf Page 8 of 9 Referring Provider: Treating Provider/Extender: Rica Mote Weeks in Treatment: 20 Information Obtained From Patient Respiratory Medical History: Past Medical History Notes: pulmonary hemorrage Cardiovascular Medical History: Positive for: Angina; Hypotension Endocrine Medical History: Negative for: Type II Diabetes Integumentary (Skin) Medical History: Negative for: History of pressure wounds Neurologic Medical History: Past Medical History Notes: MS Immunizations Pneumococcal Vaccine: Received Pneumococcal Vaccination: No Implantable Devices None Family and Social History Never smoker; Marital Status - Widowed; Alcohol Use: Never; Caffeine Use: Daily Electronic Signature(s) Signed: 09/23/2022 4:14:13 PM By: Geralyn Corwin DO Signed: 09/23/2022 4:30:42 PM By: Midge Aver MSN RN CNS WTA Entered By: Geralyn Corwin on 09/23/2022 14:31:55 -------------------------------------------------------------------------------- SuperBill Details Patient Name: Date of Service: [name], [name] 09/23/2022 Medical Record Number: [id number] Patient Account Number: [id number] Date of Birth/Sex: Treating RN: 25-Dec-1960 (62 y.o. 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, sacral and sacrococcygeal region G35 Multiple sclerosis I82.409 Acute embolism and thrombosis of unspecified deep veins of unspecified lower extremity Z79.01 Long term (  current) use of anticoagulants Facility Procedures : CPT4 Code: [id number] Description: 97605 - WOUND VAC-50 SQ CM OR LESS Modifier: Quantity: 1 Physician Procedures : CPT4 Code Description Modifier [id number] - WC PHYS LEVEL 3 - EST PT ICD-10 Diagnosis Description L89.154 Pressure ulcer of sacral region, stage 4 L89.620 Pressure ulcer of left heel, unstageable L89.510 Pressure ulcer of right ankle, unstageable  L89.523 Pressure ulcer of left ankle, stage 3 Quantity: 1 Electronic Signature(s) Signed: 09/23/2022 4:43:31 PM By: Betha Loa Signed: 09/24/2022 9:25:41 AM By: Geralyn Corwin DO Previous Signature: 09/23/2022 4:14:13 PM Version By: Geralyn Corwin DO Entered By: Betha Loa on 09/23/2022 16:35:12

## 2022-09-26 NOTE — Progress Notes (Signed)
[name], [name] ([id number]) [id number] Page 1 of 12 Visit Report for 09/23/2022 Arrival Information Details Patient Name: Date of Service: [name] STRA [name], [name] 09/23/2022 1:45 PM Medical Record Number: [id number] Patient Account Number: [id number] Date of Birth/Sex: Treating RN: 10-17-1960 (62 y.o. [name] Primary Care Malakhai Beitler: Enid Baas Other Clinician: Betha Loa Referring Robbyn Hodkinson: Treating Nyles Mitton/Extender: Rica Mote Weeks in Treatment: 20 Visit Information History Since Last Visit All ordered tests and consults were completed: No Patient Arrived: Wheel Chair Added or deleted any medications: No Arrival Time: 13:43 Any new allergies or adverse reactions: No Transfer Assistance: Michiel Sites Lift Had a fall or experienced change in No Patient Identification Verified: Yes activities of daily living that may affect Secondary Verification Process Completed: Yes risk of falls: Patient Requires Transmission-Based Precautions: No Signs or symptoms of abuse/neglect since last visito No Patient Has Alerts: Yes Hospitalized since last visit: No Patient Alerts: Patient on Blood Thinner Implantable device outside of the clinic excluding No Not Diabetic cellular tissue based products placed in the center Xarelto since last visit: Has Dressing in Place as Prescribed: Yes Pain Present Now: No Electronic Signature(s) Signed: 09/23/2022 4:43:31 PM By: Betha Loa Entered By: Betha Loa on 09/23/2022 13:47:59 -------------------------------------------------------------------------------- Encounter Discharge Information Details Patient Name: Date of Service: [name] Glen [name], [name] 09/23/2022 1:45 PM Medical Record Number: [id number] Patient Account Number: [id number] Date of Birth/Sex: Treating RN: [birth year] (62 y.o. [name] Primary Care Mccartney Brucks: Enid Baas Other Clinician:  Betha Loa Referring Luwana Butrick: Treating Dera Vanaken/Extender: Rica Mote Weeks in Treatment: 20 Encounter Discharge Information Items Discharge Condition: Stable Ambulatory Status: Wheelchair Discharge Destination: Home Transportation: Other Accompanied By: husband Schedule Follow-up Appointment: Yes Clinical Summary of Care: DEBRINA, FERGURSON ([id number]) [id number] Page 2 of 12 Electronic Signature(s) Signed: 09/23/2022 4:43:31 PM By: Betha Loa Entered By: Betha Loa on 09/23/2022 16:36:48 -------------------------------------------------------------------------------- Lower Extremity Assessment Details Patient Name: Date of Service: [name] Glen [name], [name] 09/23/2022 1:45 PM Medical Record Number: [id number] Patient Account Number: [id number] Date of Birth/Sex: Treating RN: 1960/08/04 (62 y.o. [name] Primary Care Sabine Tenenbaum: Enid Baas Other Clinician: Betha Loa Referring Lemoyne Scarpati: Treating Tavarious Freel/Extender: Rica Mote Weeks in Treatment: 20 Electronic Signature(s) Signed: 09/23/2022 4:30:42 PM By: Midge Aver MSN RN CNS WTA Signed: 09/23/2022 4:43:31 PM By: Betha Loa Entered By: Betha Loa on 09/23/2022 14:11:47 -------------------------------------------------------------------------------- Multi Wound Chart Details Patient Name: Date of Service: [name] Glen [name], [name] 09/23/2022 1:45 PM Medical Record Number: [id number] Patient Account Number: [id number] Date of Birth/Sex: Treating RN: Feb 08, 1961 (62 y.o. [name] Primary Care Jemal Miskell: Enid Baas Other Clinician: Betha Loa Referring Trevyon Swor: Treating Amdrew Oboyle/Extender: Rica Mote Weeks in Treatment: 20 Vital Signs Height(in): Pulse(bpm): 111 Weight(lbs): Blood Pressure(mmHg): 102/63 Body Mass Index(BMI): Temperature(F): 98.2 Respiratory  Rate(breaths/min): 16 [2:Photos:] [name], [name] ([id number]) [2:Left Calcaneus Wound Location: Pressure Injury Wounding Event: Pressure Ulcer Primary Etiology: Angina, Hypotension Comorbid History: 03/12/2022 Date Acquired: 20 Weeks of Treatment: Open Wound Status: No Wound Recurrence: 0.1x0.1x0.1 Measurements L x  W x D (cm) 0.008 A (cm) : rea 0.001 Volume (cm) : 99.90% % Reduction in A rea: 99.90% % Reduction in Volume: Starting Position 1 (o'clock): Ending Position 1 (o'clock): Maximum Distance 1 (cm): N/A Undermining: Unstageable/Unclassified Classification:  Small Exudate A mount: Serous Exudate Type: amber Exudate Color: N/A Wound Margin: None Present (0%) Granulation A mount: N/A Granulation Quality: Large (67-100%) Necrotic A mount: Eschar, Adherent Slough Necrotic Tissue:  Fat Layer (Subcutaneous Tissue):  Yes Fat Layer (Subcutaneous Tissue): Yes Fat Layer (Subcutaneous Tissue): Yes Exposed Structures: Fascia: No Tendon: No Muscle: No Joint: No Bone: No None Epithelialization:] [3:Proximal, Midline Sacrum Pressure Injury Pressure Ulcer Angina, Hypotension  03/09/2022 20 Open No 3x1.6x0.8 3.77 3.016 84.00% 97.90% 12 12 2.4 Yes Category/Stage IV Large Serosanguineous red, brown Epibole Large (67-100%) Red, Hyper-granulation Small (1-33%) N/A Muscle: Yes Fascia: No Tendon: No Joint: No Bone: No None]  [[id number] Page 3 of 12 Right, Lateral Ankle Gradually Appeared Pressure Ulcer Angina, Hypotension 06/22/2022 Open No 0.1x0.1x0.1 0.008 0.001 98.60% 98.30% N/A Unstageable/Unclassified None Present N/A N/A N/A None Present  (0%) N/A Large (67-100%) Eschar None] Wound Number: 6 N/A N/A Photos: N/A N/A Left, Medial Ankle N/A N/A Wound Location: Pressure Injury N/A N/A Wounding Event: Pressure Ulcer N/A N/A Primary Etiology: Angina, Hypotension N/A N/A Comorbid History: 08/12/2022 N/A N/A Date Acquired: 3 N/A N/A Weeks of Treatment: Open N/A N/A Wound  Status: No N/A N/A Wound Recurrence: 0.5x0.7x0.1 N/A N/A Measurements L x W x D (cm) 0.275 N/A N/A A (cm) : rea 0.027 N/A N/A Volume (cm) : 28.60% N/A N/A % Reduction in A rea: 76.50% N/A N/A % Reduction in Volume: N/A N/A N/A Undermining: Category/Stage III N/A N/A Classification: Medium N/A N/A Exudate A mount: Serosanguineous N/A N/A Exudate Type: red, brown N/A N/A Exudate Color: N/A N/A N/A Wound Margin: Medium (34-66%) N/A N/A Granulation A mount: Red N/A N/A Granulation Quality: Medium (34-66%) N/A N/A Necrotic A mount: Adherent Slough N/A N/A Necrotic Tissue: Fat Layer (Subcutaneous Tissue): Yes N/A N/A Exposed Structures: Fascia: No Tendon: No Muscle: No Joint: No Bone: No Small (1-33%) N/A N/A Epithelialization: Treatment Notes Electronic Signature(s) Signed: 09/23/2022 4:43:31 PM By: Betha Loa Entered By: Betha Loa on 09/23/2022 14:11:51 [name] ([id number]) [id number] Page 4 of 12 -------------------------------------------------------------------------------- Multi-Disciplinary Care Plan Details Patient Name: Date of Service: [name] Glen [name], [name] 09/23/2022 1:45 PM Medical Record Number: [id number] Patient Account Number: [id number] Date of Birth/Sex: Treating RN: [birth year] (62 y.o. [name] Primary Care Allie Gerhold: Enid Baas Other Clinician: Betha Loa Referring Channel Papandrea: Treating Nika Yazzie/Extender: Rica Mote Weeks in Treatment: 20 Active Inactive Wound/Skin Impairment Nursing Diagnoses: Impaired tissue integrity Knowledge deficit related to ulceration/compromised skin integrity Goals: Patient/caregiver will verbalize understanding of skin care regimen Date Initiated: 05/06/2022 Date Inactivated: 07/29/2022 Target Resolution Date: 08/28/2022 Goal Status: Met Ulcer/skin breakdown will have a volume reduction of 30% by week 4 Date  Initiated: 05/06/2022 Date Inactivated: 07/29/2022 Target Resolution Date: 06/06/2022 Goal Status: Unmet Unmet Reason: not 30% decrease Ulcer/skin breakdown will have a volume reduction of 50% by week 8 Date Initiated: 05/20/2022 Date Inactivated: 07/29/2022 Target Resolution Date: 07/05/2022 Unmet Reason: not 50% decrease in Goal Status: Unmet size Ulcer/skin breakdown will have a volume reduction of 80% by week 12 Date Initiated: 05/20/2022 Date Inactivated: 07/29/2022 Target Resolution Date: 08/05/2022 Unmet Reason: not 80% decrease in Goal Status: Unmet size Ulcer/skin breakdown will heal within 14 weeks Date Initiated: 05/20/2022 Target Resolution Date: 10/09/2022 Goal Status: Active Interventions: Assess patient/caregiver ability to perform ulcer/skin care regimen upon admission and as needed Assess ulceration(s) every visit Provide education on ulcer and skin care Screen for HBO Treatment Activities: Skin care regimen initiated : 05/06/2022 Topical wound management initiated : 05/06/2022 Notes: Negative Pressure Wound Therapy Nursing Diagnoses: Knowledge deficit related to use and safety of the device Impaired tissue integrity Goals: Patient/caregiver agrees to and verbalizes understanding of need to use Date  Initiated: 09/14/2022 Target Resolution Date: 10/14/2022 Goal Status: Active Patient/caregiver will verbalize understanding of use of the device Date Initiated: 09/14/2022 Target Resolution Date: 10/14/2022 Goal Status: Active [name], [name] ([id number]) [phone].pdf Page 5 of 12 Promote granulation tissue Date Initiated: 09/14/2022 Target Resolution Date: 11/04/2022 Goal Status: Active Reduce inflammatory exudate Date Initiated: 09/14/2022 Target Resolution Date: 10/31/2022 Goal Status: Active Interventions: Assess drainage (amount and color) Assess patient nutrition upon admission and as needed per policy Assess patient understanding of disease  process and management Assess patient/caregiver ability to obtain necessary supplies Assess wound bed for efficacy of treatment Monitor and protect skin around the wound Provide education on nutrition Provide education on use, care, and troubleshooting Treatment Activities: Incontinence or moisture care in place (i.e., Barrier creams, foley catheter, colostomy, diapers, etc.) : 09/02/2022 Patient referred to home health care : 09/02/2022 Referred to DME Arlys Scatena for dressing supplies : 09/02/2022 Support surface (Group 2/3) in use : 09/02/2022 Turning and Repositioning schedule in place : 09/02/2022 Notes: Electronic Signature(s) Signed: 09/23/2022 4:43:31 PM By: Betha Loa Signed: 09/24/2022 4:26:39 PM By: Midge Aver MSN RN CNS WTA Entered By: Betha Loa on 09/23/2022 16:35:20 -------------------------------------------------------------------------------- Negative Pressure Wound Therapy Application (NPWT) Details Patient Name: Date of ServiceFlorene Glen [name], [name] 09/23/2022 1:45 PM Medical Record Number: [id number] Patient Account Number: [id number] Date of Birth/Sex: Treating RN: [birth year] (62 y.o. [name] Primary Care Hisao Doo: Enid Baas Other Clinician: Betha Loa Referring Shaylon Gillean: Treating Vergene Marland/Extender: Rica Mote Weeks in Treatment: 20 NPWT Application Performed for: Wound #3 Proximal, Midline Sacrum Performed By: Midge Aver, RN Type: VAC System Coverage Size (sq cm): 4.8 Pressure Type: Constant Pressure Setting: 125 mmHG Drain Type: None Primary Contact: Other Other: black foam Quantity of Sponges/Gauze Inserted: 1 Sponge/Dressing Type: Foam- Black Date Initiated: 09/23/2022 Response to Treatment: patient tolerated well Post Procedure Diagnosis Same as Pre-procedure Electronic Signature(s) Signed: 09/23/2022 4:43:31 PM By: Betha Loa Entered By: Betha Loa on 09/23/2022 15:05:30 [name] ([id number]) [id number] Page 6 of 12 -------------------------------------------------------------------------------- Pain Assessment Details Patient Name: Date of Service: [name] Glen [name], [name] 09/23/2022 1:45 PM Medical Record Number: [id number] Patient Account Number: [id number] Date of Birth/Sex: Treating RN: November 27, 1960 (62 y.o. [name] Primary Care Rannie Craney: Enid Baas Other Clinician: Betha Loa Referring Nadiah Corbit: Treating Stacye Noori/Extender: Rica Mote Weeks in Treatment: 20 Active Problems Location of Pain Severity and Description of Pain Patient Has Paino No Site Locations Pain Management and Medication Current Pain Management: Electronic Signature(s) Signed: 09/23/2022 4:30:42 PM By: Midge Aver MSN RN CNS WTA Signed: 09/23/2022 4:43:31 PM By: Betha Loa Entered By: Betha Loa on 09/23/2022 13:52:04 -------------------------------------------------------------------------------- Patient/Caregiver Education Details Patient Name: Date of Service: [name] Glen [name], [name] 6/12/2024andnbsp1:45 PM Medical Record Number: [id number] Patient Account Number: [id number] Date of Birth/Gender: Treating RN: October 26, 1960 (62 y.o. [name] Primary Care Physician: Enid Baas Other Clinician: Betha Loa Referring Physician: Treating Physician/Extender: Rica Mote [place] ([id number]) [id number].pdf Page 7 of 12 Weeks in Treatment: 20 Education Assessment Education Provided To: Patient Education Topics Provided Wound/Skin Impairment: Handouts: Other: continue wound care as directed Methods: Explain/Verbal Responses: State content correctly Electronic Signature(s) Signed: 09/23/2022 4:43:31 PM By: Betha Loa Entered By: Betha Loa on 09/23/2022  16:35:39 -------------------------------------------------------------------------------- Wound Assessment Details Patient Name: Date of Service: [name] Glen [name], [name] 09/23/2022 1:45 PM Medical Record Number: [id number] Patient Account Number: [id number] Date of Birth/Sex: Treating RN: [birth year] (62 y.o. F)  Midge Aver Primary Care Philis Doke: Enid Baas Other Clinician: Betha Loa Referring Brisha Mccabe: Treating Dandy Lazaro/Extender: Rica Mote Weeks in Treatment: 20 Wound Status Wound Number: 2 Primary Etiology: Pressure Ulcer Wound Location: Left Calcaneus Wound Status: Open Wounding Event: Pressure Injury Comorbid History: Angina, Hypotension Date Acquired: 03/12/2022 Weeks Of Treatment: 20 Clustered Wound: No Photos Wound Measurements Length: (cm) 0.1 Width: (cm) 0.1 Depth: (cm) 0.1 Area: (cm) 0.008 Volume: (cm) 0.001 % Reduction in Area: 99.9% % Reduction in Volume: 99.9% Epithelialization: None Wound Description Classification: Unstageable/Unclassified [name], [name] ([id number]) Exudate Amount: Small Exudate Type: Serous Exudate Color: amber Foul Odor After Cleansing: No [phone].pdf Page 8 of 12 Slough/Fibrino Yes Wound Bed Granulation Amount: None Present (0%) Exposed Structure Necrotic Amount: Large (67-100%) Fascia Exposed: No Necrotic Quality: Eschar, Adherent Slough Fat Layer (Subcutaneous Tissue) Exposed: Yes Tendon Exposed: No Muscle Exposed: No Joint Exposed: No Bone Exposed: No Treatment Notes Wound #2 (Calcaneus) Wound Laterality: Left Cleanser Soap and Water Discharge Instruction: Gently cleanse wound with antibacterial soap, rinse and pat dry prior to dressing wounds Peri-Wound Care Topical Santyl Collagenase Ointment, 30 (gm), tube Discharge Instruction: apply nickel thick to wound bed only Primary Dressing Hydrofera Blue Ready Transfer Foam, 2.5x2.5  (in/in) Discharge Instruction: Apply Hydrofera Blue Ready to wound bed as directed Zetuvit Plus 4x4 (in/in) Secondary Dressing Secured With Compression Wrap Compression Stockings Add-Ons Electronic Signature(s) Signed: 09/23/2022 4:30:42 PM By: Midge Aver MSN RN CNS WTA Signed: 09/23/2022 4:43:31 PM By: Betha Loa Entered By: Betha Loa on 09/23/2022 14:10:08 -------------------------------------------------------------------------------- Wound Assessment Details Patient Name: Date of Service: [name] Glen [name], [name] 09/23/2022 1:45 PM Medical Record Number: [id number] Patient Account Number: [id number] Date of Birth/Sex: Treating RN: [birth year] (62 y.o. [name] Primary Care Merica Prell: Enid Baas Other Clinician: Betha Loa Referring Tenley Winward: Treating Queen Abbett/Extender: Rica Mote Weeks in Treatment: 20 Wound Status Wound Number: 3 Primary Etiology: Pressure Ulcer Wound Location: Proximal, Midline Sacrum Wound Status: Open Wounding Event: Pressure Injury Comorbid History: Angina, Hypotension Date Acquired: 03/09/2022 Weeks Of Treatment: 20 Clustered Wound: No [name], [name] ([id number]) [id number].pdf Page 9 of 12 Photos Wound Measurements Length: (cm) 3 Width: (cm) 1.6 Depth: (cm) 0.8 Area: (cm) 3.77 Volume: (cm) 3.016 % Reduction in Area: 84% % Reduction in Volume: 97.9% Epithelialization: None Undermining: Yes Starting Position (o'clock): 12 Ending Position (o'clock): 12 Maximum Distance: (cm) 2.4 Wound Description Classification: Category/Stage IV Wound Margin: Epibole Exudate Amount: Large Exudate Type: Serosanguineous Exudate Color: red, brown Foul Odor After Cleansing: No Slough/Fibrino Yes Wound Bed Granulation Amount: Large (67-100%) Exposed Structure Granulation Quality: Red, Hyper-granulation Fascia Exposed: No Necrotic Amount: Small (1-33%) Fat Layer  (Subcutaneous Tissue) Exposed: Yes Tendon Exposed: No Muscle Exposed: Yes Necrosis of Muscle: No Joint Exposed: No Bone Exposed: No Treatment Notes Wound #3 (Sacrum) Wound Laterality: Midline, Proximal Cleanser Vashe 5.8 (oz) Discharge Instruction: Use vashe 5.8 (oz) as directed Peri-Wound Care Topical Primary Dressing Secondary Dressing Secured With Compression Wrap Compression Stockings Add-Ons Wound Vac Electronic Signature(s) Signed: 09/23/2022 4:30:42 PM By: Midge Aver MSN RN CNS WTA Signed: 09/23/2022 4:43:31 PM By: Betha Loa Entered By: Betha Loa on 09/23/2022 14:10:53 [name] ([id number]) [id number] Page 10 of 12 -------------------------------------------------------------------------------- Wound Assessment Details Patient Name: Date of Service: [name] Glen [name], [name] 09/23/2022 1:45 PM Medical Record Number: [id number] Patient Account Number: [id number] Date of Birth/Sex: Treating RN: 1960-04-25 (62 y.o. [name] Primary Care Webb Weed: Enid Baas Other Clinician: Betha Loa Referring Alejo Beamer: Treating Wahid Holley/Extender: Gwenlyn Fudge,  Radhika Weeks in Treatment: 20 Wound Status Wound Number: 5 Primary Etiology: Pressure Ulcer Wound Location: Right, Lateral Ankle Wound Status: Healed - Epithelialized Wounding Event: Gradually Appeared Comorbid History: Angina, Hypotension Date Acquired: 06/22/2022 Weeks Of Treatment: 12 Clustered Wound: No Photos Wound Measurements Length: (cm) 0 Width: (cm) 0 Depth: (cm) 0 Area: (cm) Volume: (cm) % Reduction in Area: 100% % Reduction in Volume: 100% Epithelialization: Large (67-100%) 0 0 Wound Description Classification: Unstageable/Unclassified Exudate Amount: None Present Foul Odor After Cleansing: No Slough/Fibrino No Wound Bed Granulation Amount: None Present (0%) Exposed Structure Necrotic Amount: None Present  (0%) Fat Layer (Subcutaneous Tissue) Exposed: Yes Treatment Notes Wound #5 (Ankle) Wound Laterality: Right, Lateral Cleanser Peri-Wound Care Topical Primary Dressing Secondary Dressing Secured With Compression [name], [name] ([id number]) [id number] Page 11 of 12 Compression Stockings Add-Ons Electronic Signature(s) Signed: 09/23/2022 4:30:42 PM By: Midge Aver MSN RN CNS WTA Signed: 09/23/2022 4:43:31 PM By: Betha Loa Entered By: Betha Loa on 09/23/2022 14:22:12 -------------------------------------------------------------------------------- Wound Assessment Details Patient Name: Date of Service: [name] Glen [name], [name] 09/23/2022 1:45 PM Medical Record Number: [id number] Patient Account Number: [id number] Date of Birth/Sex: Treating RN: Nov 19, 1960 (62 y.o. [name] Primary Care Argenis Kumari: Enid Baas Other Clinician: Betha Loa Referring Houda Brau: Treating Kullen Tomasetti/Extender: Rica Mote Weeks in Treatment: 20 Wound Status Wound Number: 6 Primary Etiology: Pressure Ulcer Wound Location: Left, Medial Ankle Wound Status: Open Wounding Event: Pressure Injury Comorbid History: Angina, Hypotension Date Acquired: 08/12/2022 Weeks Of Treatment: 3 Clustered Wound: No Photos Wound Measurements Length: (cm) 0.5 Width: (cm) 0.7 Depth: (cm) 0.1 Area: (cm) 0.275 Volume: (cm) 0.027 % Reduction in Area: 28.6% % Reduction in Volume: 76.5% Epithelialization: Small (1-33%) Wound Description Classification: Category/Stage III Exudate Amount: Medium Exudate Type: Serosanguineous Exudate Color: red, brown Foul Odor After Cleansing: No Slough/Fibrino Yes Wound Bed Granulation Amount: Medium (34-66%) Exposed Structure Granulation Quality: Red Fascia Exposed: No Necrotic Amount: Medium (34-66%) Fat Layer (Subcutaneous Tissue) Exposed: Yes Necrotic Quality: Adherent Slough Tendon Exposed:  No Muscle Exposed: No Joint Exposed: No [name], [name] ([id number]) [id number].pdf Page 12 of 12 Bone Exposed: No Treatment Notes Wound #6 (Ankle) Wound Laterality: Left, Medial Cleanser Soap and Water Discharge Instruction: Gently cleanse wound with antibacterial soap, rinse and pat dry prior to dressing wounds Peri-Wound Care Topical Activon Honey Gel, 25 (g) Tube Primary Dressing Hydrofera Blue Ready Transfer Foam, 2.5x2.5 (in/in) Discharge Instruction: Apply Hydrofera Blue Ready to wound bed as directed Zetuvit Plus 4x4 (in/in) Secondary Dressing Secured With Compression Wrap Compression Stockings Add-Ons Electronic Signature(s) Signed: 09/23/2022 4:30:42 PM By: Midge Aver MSN RN CNS WTA Signed: 09/23/2022 4:43:31 PM By: Betha Loa Entered By: Betha Loa on 09/23/2022 14:11:39 -------------------------------------------------------------------------------- Vitals Details Patient Name: Date of Service: [name] Glen [name], [name] 09/23/2022 1:45 PM Medical Record Number: [id number] Patient Account Number: [id number] Date of Birth/Sex: Treating RN: 26-Jan-1961 (62 y.o. [name] Primary Care Eivan Gallina: Enid Baas Other Clinician: Betha Loa Referring Jaley Yan: Treating Manuela Halbur/Extender: Rica Mote Weeks in Treatment: 20 Vital Signs Time Taken: 13:49 Temperature (F): 98.2 Pulse (bpm): 111 Respiratory Rate (breaths/min): 16 Blood Pressure (mmHg): 102/63 Reference Range: 80 - 120 mg / dl Electronic Signature(s) Signed: 09/23/2022 4:43:31 PM By: Betha Loa Entered By: Betha Loa on 09/23/2022 13:51:58

## 2022-09-29 ENCOUNTER — Other Ambulatory Visit (INDEPENDENT_AMBULATORY_CARE_PROVIDER_SITE_OTHER): Payer: Self-pay | Admitting: Nurse Practitioner

## 2022-09-29 DIAGNOSIS — L89620 Pressure ulcer of left heel, unstageable: Secondary | ICD-10-CM

## 2022-09-29 DIAGNOSIS — L89610 Pressure ulcer of right heel, unstageable: Secondary | ICD-10-CM

## 2022-09-30 ENCOUNTER — Ambulatory Visit (INDEPENDENT_AMBULATORY_CARE_PROVIDER_SITE_OTHER): Payer: Medicare Other

## 2022-09-30 ENCOUNTER — Ambulatory Visit (INDEPENDENT_AMBULATORY_CARE_PROVIDER_SITE_OTHER): Payer: Medicare Other | Admitting: Nurse Practitioner

## 2022-09-30 ENCOUNTER — Encounter (INDEPENDENT_AMBULATORY_CARE_PROVIDER_SITE_OTHER): Payer: Self-pay | Admitting: Nurse Practitioner

## 2022-09-30 VITALS — BP 105/71 | HR 120 | Resp 18 | Ht 62.0 in | Wt 130.0 lb

## 2022-09-30 DIAGNOSIS — L89620 Pressure ulcer of left heel, unstageable: Secondary | ICD-10-CM | POA: Diagnosis not present

## 2022-09-30 DIAGNOSIS — G35 Multiple sclerosis: Secondary | ICD-10-CM

## 2022-09-30 DIAGNOSIS — L89610 Pressure ulcer of right heel, unstageable: Secondary | ICD-10-CM | POA: Diagnosis not present

## 2022-10-01 LAB — VAS US ABI WITH/WO TBI
Left ABI: 1.35
Right ABI: 1.27

## 2022-10-01 NOTE — Progress Notes (Signed)
Subjective:    Patient ID: [name], female    DOB: [birth year], 62 y.o.   MRN: [id number] Chief Complaint  Patient presents with   New Patient (Initial Visit)    np. ABI + consult. bilateral heel ulcer. referred by Geralyn Corwin.    The patient is a 62 year old female who presents today from an evaluation by Dr. Mikey Bussing with regards to delayed wound healing.  The patient has a history of multiple sclerosis and has a large stage IV sacral decubitus ulceration as well as ulcers on her bilateral heels and ankle areas.  The wounds on her lower extremities resulted from pressure from her wheelchair as well as some shoe pressure.  She has been receiving wound care and they know that the wounds have improved but they have been somewhat slow.  Today the patient underwent ABIs which shows an ABI of 1.27 on the right and 1.35 on the left.  The exam was somewhat limited due to the patient's positioning in the wheelchair.  However she had normal toe pressures bilaterally.  She also had triphasic tibial artery waveforms bilaterally.    Review of Systems  Cardiovascular:  Positive for leg swelling.  Skin:  Positive for wound.  Neurological:  Positive for weakness.  All other systems reviewed and are negative.      Objective:   Physical Exam Vitals reviewed.  HENT:     Head: Normocephalic.  Cardiovascular:     Rate and Rhythm: Normal rate.     Pulses:          Dorsalis pedis pulses are detected w/ Doppler on the right side and detected w/ Doppler on the left side.       Posterior tibial pulses are detected w/ Doppler on the right side and detected w/ Doppler on the left side.  Pulmonary:     Effort: Pulmonary effort is normal.  Skin:    General: Skin is warm and dry.  Neurological:     Mental Status: She is alert and oriented to person, place, and time.  Psychiatric:        Mood and Affect: Mood normal.        Behavior: Behavior normal.        Thought Content: Thought  content normal.        Judgment: Judgment normal.     BP 105/71 (BP Location: Left Arm)   Pulse (!) 120   Resp 18   Ht 5\' 2"  (1.575 m)   Wt 130 lb (59 kg)   BMI 23.78 kg/m   Past Medical History:  Diagnosis Date   Bilateral pulmonary embolism (HCC)    Chronic constipation    Family history of pancreatic cancer    Frequent falls    GERD (gastroesophageal reflux disease)    Hiatal hernia    Multiple sclerosis (HCC)    Pulmonary hemorrhage    RLS (restless legs syndrome)    Sepsis due to pneumonia (HCC)    Tachycardia     Social History   Socioeconomic History   Marital status: Widowed    Spouse name: Not on file   Number of children: Not on file   Years of education: Not on file   Highest education level: Not on file  Occupational History   Not on file  Tobacco Use   Smoking status: Never   Smokeless tobacco: Never  Vaping Use   Vaping Use: Never used  Substance and Sexual Activity   Alcohol use: Never  Drug use: Never   Sexual activity: Not Currently  Other Topics Concern   Not on file  Social History Narrative   Not on file   Social Determinants of Health   Financial Resource Strain: Not on file  Food Insecurity: No Food Insecurity (04/17/2022)   Hunger Vital Sign    Worried About Running Out of Food in the Last Year: Never true    Ran Out of Food in the Last Year: Never true  Transportation Needs: No Transportation Needs (04/17/2022)   PRAPARE - Administrator, Civil Service (Medical): No    Lack of Transportation (Non-Medical): No  Physical Activity: Not on file  Stress: Not on file  Social Connections: Not on file  Intimate Partner Violence: Not At Risk (04/17/2022)   Humiliation, Afraid, Rape, and Kick questionnaire    Fear of Current or Ex-Partner: No    Emotionally Abused: No    Physically Abused: No    Sexually Abused: No    Past Surgical History:  Procedure Laterality Date   ESOPHAGOGASTRODUODENOSCOPY (EGD) WITH PROPOFOL N/A  09/11/2021   Procedure: ESOPHAGOGASTRODUODENOSCOPY (EGD) WITH PROPOFOL;  Surgeon: Jaynie Collins, DO;  Location: [facility] ENDOSCOPY;  Service: Gastroenterology;  Laterality: N/A;   PULMONARY THROMBECTOMY Bilateral 02/25/2022   Procedure: PULMONARY THROMBECTOMY;  Surgeon: Annice Needy, MD;  Location: [facility] INVASIVE CV LAB;  Service: Cardiovascular;  Laterality: Bilateral;   TUBAL LIGATION      History reviewed. No pertinent family history.  Allergies  Allergen Reactions   Erythromycin Hives and Nausea And Vomiting   Hydrocodone-Acetaminophen Other (See Comments)    Paranoid and depressed    Lexapro [Escitalopram Oxalate] Hives       Latest Ref Rng & Units 07/03/2022   11:45 AM 06/17/2022    9:17 AM 06/05/2022   10:56 AM  CBC  WBC 4.0 - 10.5 K/uL 12.3  17.3  17.4   Hemoglobin 12.0 - 15.0 g/dL 8.3  9.2  7.3   Hematocrit 36.0 - 46.0 % 28.6  31.8  24.6   Platelets 150 - 400 K/uL 426  493  393       CMP     Component Value Date/Time   NA 130 (L) 05/28/2022 1200   K 3.5 05/28/2022 1200   CL 95 (L) 05/28/2022 1200   CO2 23 05/28/2022 1200   GLUCOSE 105 (H) 05/28/2022 1200   BUN 11 05/28/2022 1200   CREATININE 0.46 05/28/2022 1200   CALCIUM 8.5 (L) 05/28/2022 1200   PROT 5.8 (L) 05/28/2022 1200   ALBUMIN 2.2 (L) 05/28/2022 1200   AST 32 05/28/2022 1200   ALT 17 05/28/2022 1200   ALKPHOS 298 (H) 05/28/2022 1200   BILITOT 0.5 05/28/2022 1200   GFRNONAA >60 05/28/2022 1200     No results found.     Assessment & Plan:   1. Pressure sore on heel, left, unstageable (HCC) Today the patient's noninvasive studies indicate that she should have adequate perfusion for wound healing.  At this time would not recommend further intervention.  Patient will follow-up with [place] on an as-needed basis.  2. Multiple sclerosis (HCC) Patient's decreased mobility will also increase her risk for wounds.  Padding sensitive areas such as her heels will be necessary to prevent further wounds in the  future as well as to improve healing.   Current Outpatient Medications on File Prior to Visit  Medication Sig Dispense Refill   ascorbic acid (VITAMIN C) 500 MG tablet Take 500 mg by  mouth daily.     Cholecalciferol 50 MCG (2000 UT) CAPS Take 2,000 Units by mouth daily.     collagenase (SANTYL) 250 UNIT/GM ointment Apply 1 Application topically daily.     cyanocobalamin 1000 MCG tablet Place 1 tablet (1,000 mcg total) into feeding tube daily.     cyclobenzaprine (FLEXERIL) 10 MG tablet Take 10 mg by mouth 2 (two) times daily as needed.     dalfampridine 10 MG TB12 Take 1 tablet by mouth every 12 (twelve) hours.     DULoxetine (CYMBALTA) 30 MG capsule Take 30 mg by mouth daily.     gabapentin (NEURONTIN) 800 MG tablet Take 400-800 mg by mouth 2 (two) times daily.     imipramine (TOFRANIL) 25 MG tablet Take 25 mg by mouth at bedtime.     levofloxacin (LEVAQUIN) 750 MG tablet Take 1 tablet (750 mg total) by mouth daily. 30 tablet 1   midodrine (PROAMATINE) 5 MG tablet Take 1 tablet (5 mg total) by mouth 2 (two) times daily with a meal. 14 tablet 0   nitrofurantoin, macrocrystal-monohydrate, (MACROBID) 100 MG capsule Take 100 mg by mouth 2 (two) times daily.     omeprazole (PRILOSEC) 20 MG capsule Take 20 mg by mouth daily.     pregabalin (LYRICA) 75 MG capsule Take 75 mg by mouth 3 (three) times daily.     propranolol (INDERAL) 20 MG tablet Take 20 mg by mouth 2 (two) times daily.     rivaroxaban (XARELTO) 20 MG TABS tablet Take 1 tablet (20 mg total) by mouth daily with supper. 30 tablet 0   rOPINIRole (REQUIP) 0.5 MG tablet Take 0.5 mg by mouth at bedtime.     tolterodine (DETROL LA) 2 MG 24 hr capsule Take 2 mg by mouth daily.     traMADol (ULTRAM) 50 MG tablet Take 50 mg by mouth 2 (two) times daily as needed.     traZODone (DESYREL) 50 MG tablet Take 50 mg by mouth at bedtime.     No current facility-administered medications on file prior to visit.    There are no Patient Instructions  on file for this visit. No follow-ups on file.   Georgiana Spinner, NP

## 2022-10-05 ENCOUNTER — Other Ambulatory Visit: Payer: Medicare Other

## 2022-10-07 ENCOUNTER — Encounter (HOSPITAL_BASED_OUTPATIENT_CLINIC_OR_DEPARTMENT_OTHER): Payer: Medicare Other | Admitting: Internal Medicine

## 2022-10-07 DIAGNOSIS — M4628 Osteomyelitis of vertebra, sacral and sacrococcygeal region: Secondary | ICD-10-CM | POA: Diagnosis not present

## 2022-10-07 DIAGNOSIS — L89154 Pressure ulcer of sacral region, stage 4: Secondary | ICD-10-CM

## 2022-10-07 DIAGNOSIS — L89523 Pressure ulcer of left ankle, stage 3: Secondary | ICD-10-CM

## 2022-10-07 DIAGNOSIS — L89620 Pressure ulcer of left heel, unstageable: Secondary | ICD-10-CM

## 2022-10-07 NOTE — Progress Notes (Signed)
[name] ([id number]) [id number] Page 1 of 11 Visit Report for 10/07/2022 Arrival Information Details Patient Name: Date of Service: [name] STRA ND, [name] 10/07/2022 1:15 PM Medical Record Number: [id number] Patient Account Number: [id number] Date of Birth/Sex: Treating RN: Jul 26, 1960 (62 y.o. [name] Primary Care Timmey Lamba: Enid Baas Other Clinician: Referring Prue Lingenfelter: Treating Evian Salguero/Extender: Rica Mote Weeks in Treatment: 22 Visit Information History Since Last Visit Added or deleted any medications: No Patient Arrived: Wheel Chair Any new allergies or adverse reactions: No Arrival Time: 13:49 Has Dressing in Place as Prescribed: Yes Accompanied By: boyfriend Pain Present Now: No Transfer Assistance: Nurse, adult Patient Identification Verified: Yes Secondary Verification Process Completed: Yes Patient Requires Transmission-Based Precautions: No Patient Has Alerts: Yes Patient Alerts: Patient on Blood Thinner Not Diabetic Xarelto Electronic Signature(s) Signed: 10/07/2022 4:45:45 PM By: Midge Aver MSN RN CNS WTA Entered By: Midge Aver on 10/07/2022 13:49:40 -------------------------------------------------------------------------------- Clinic Level of Care Assessment Details Patient Name: Date of Service: [name] ND, [name] 10/07/2022 1:15 PM Medical Record Number: [id number] Patient Account Number: [id number] Date of Birth/Sex: Treating RN: 09/19/1960 (61 y.o. [name] Primary Care Caedyn Raygoza: Enid Baas Other Clinician: Referring Ronae Noell: Treating Luie Laneve/Extender: Rica Mote Weeks in Treatment: 22 Clinic Level of Care Assessment Items TOOL 4 Quantity Score X- 1 0 Use when only an EandM is performed on FOLLOW-UP visit ASSESSMENTS - Nursing Assessment / Reassessment X- 1 10 Reassessment of Co-morbidities (includes updates in  patient status) X- 1 5 Reassessment of Adherence to Treatment Plan ASSESSMENTS - Wound and Skin A ssessment / Reassessment []  - 0 Simple Wound Assessment / Reassessment - one wound [name] ([id number]) [id number] Page 2 of 11 X- 3 5 Complex Wound Assessment / Reassessment - multiple wounds []  - 0 Dermatologic / Skin Assessment (not related to wound area) ASSESSMENTS - Focused Assessment []  - 0 Circumferential Edema Measurements - multi extremities []  - 0 Nutritional Assessment / Counseling / Intervention []  - 0 Lower Extremity Assessment (monofilament, tuning fork, pulses) []  - 0 Peripheral Arterial Disease Assessment (using hand held doppler) ASSESSMENTS - Ostomy and/or Continence Assessment and Care []  - 0 Incontinence Assessment and Management []  - 0 Ostomy Care Assessment and Management (repouching, etc.) PROCESS - Coordination of Care []  - 0 Simple Patient / Family Education for ongoing care X- 1 20 Complex (extensive) Patient / Family Education for ongoing care X- 1 10 Staff obtains Chiropractor, Records, T Results / Process Orders est []  - 0 Staff telephones HHA, Nursing Homes / Clarify orders / etc []  - 0 Routine Transfer to another Facility (non-emergent condition) []  - 0 Routine Hospital Admission (non-emergent condition) []  - 0 New Admissions / Manufacturing engineer / Ordering NPWT Apligraf, etc. , []  - 0 Emergency Hospital Admission (emergent condition) X- 1 10 Simple Discharge Coordination []  - 0 Complex (extensive) Discharge Coordination PROCESS - Special Needs []  - 0 Pediatric / Minor Patient Management []  - 0 Isolation Patient Management []  - 0 Hearing / Language / Visual special needs []  - 0 Assessment of Community assistance (transportation, D/C planning, etc.) []  - 0 Additional assistance / Altered mentation []  - 0 Support Surface(s) Assessment (bed, cushion, seat, etc.) INTERVENTIONS - Wound  Cleansing / Measurement []  - 0 Simple Wound Cleansing - one wound X- 3 5 Complex Wound Cleansing - multiple wounds X- 1 5 Wound Imaging (photographs - any number of wounds) []  - 0 Wound Tracing (instead of photographs) []  - 0 Simple  Wound Measurement - one wound X- 3 5 Complex Wound Measurement - multiple wounds INTERVENTIONS - Wound Dressings X - Small Wound Dressing one or multiple wounds 2 10 X- 1 15 Medium Wound Dressing one or multiple wounds []  - 0 Large Wound Dressing one or multiple wounds []  - 0 Application of Medications - topical []  - 0 Application of Medications - injection INTERVENTIONS - Miscellaneous []  - 0 External ear exam []  - 0 Specimen Collection (cultures, biopsies, blood, body fluids, etc.) ASHIAH, KARPOWICZ ([id number]) [id number] Page 3 of 11 []  - 0 Specimen(s) / Culture(s) sent or taken to Lab for analysis X- 1 10 Patient Transfer (multiple staff / Michiel Sites Lift / Similar devices) []  - 0 Simple Staple / Suture removal (25 or less) []  - 0 Complex Staple / Suture removal (26 or more) []  - 0 Hypo / Hyperglycemic Management (close monitor of Blood Glucose) []  - 0 Ankle / Brachial Index (ABI) - do not check if billed separately X- 1 5 Vital Signs Has the patient been seen at the hospital within the last three years: Yes Total Score: 155 Level Of Care: New/Established - Level 4 Electronic Signature(s) Signed: 10/07/2022 4:45:45 PM By: Midge Aver MSN RN CNS WTA Entered By: Midge Aver on 10/07/2022 16:20:17 -------------------------------------------------------------------------------- Encounter Discharge Information Details Patient Name: Date of Service: [name] ND, [name] 10/07/2022 1:15 PM Medical Record Number: [id number] Patient Account Number: [id number] Date of Birth/Sex: Treating RN: [birth year] (62 y.o. [name] Primary Care Daemion Mcniel: Enid Baas Other Clinician: Referring  Sylvain Hasten: Treating Chikita Dogan/Extender: Rica Mote Weeks in Treatment: 22 Encounter Discharge Information Items Discharge Condition: Stable Ambulatory Status: Wheelchair Discharge Destination: Home Transportation: Private Auto Accompanied By: Boyfriend Schedule Follow-up Appointment: Yes Clinical Summary of Care: Electronic Signature(s) Signed: 10/07/2022 4:24:51 PM By: Midge Aver MSN RN CNS WTA Entered By: Midge Aver on 10/07/2022 16:24:51 -------------------------------------------------------------------------------- Lower Extremity Assessment Details Patient Name: Date of Service: [name] ND, [name] 10/07/2022 1:15 PM Medical Record Number: [id number] Patient Account Number: [id number] Date of Birth/Sex: Treating RN: 10-27-1960 (62 y.o. Marye, Eagen, Donita ([id number]) [id number] Page 4 of 11 Primary Care Chandria Rookstool: Enid Baas Other Clinician: Referring Merari Pion: Treating Kealani Leckey/Extender: Rica Mote Weeks in Treatment: 22 Electronic Signature(s) Signed: 10/07/2022 4:45:45 PM By: Midge Aver MSN RN CNS WTA Entered By: Midge Aver on 10/07/2022 14:13:01 -------------------------------------------------------------------------------- Multi Wound Chart Details Patient Name: Date of Service: [name] ND, [name] 10/07/2022 1:15 PM Medical Record Number: [id number] Patient Account Number: [id number] Date of Birth/Sex: Treating RN: July 04, 1960 (62 y.o. [name] Primary Care Ghazi Rumpf: Enid Baas Other Clinician: Referring Rozelle Caudle: Treating Sausha Raymond/Extender: Rica Mote Weeks in Treatment: 22 Vital Signs Height(in): Pulse(bpm): 87 Weight(lbs): Blood Pressure(mmHg): 100/82 Body Mass Index(BMI): Temperature(F): 98.0 Respiratory Rate(breaths/min): 16 [2:Photos: No Photos] [6:No Photos] Left Calcaneus Proximal, Midline  Sacrum Left, Medial Ankle Wound Location: Pressure Injury Pressure Injury Pressure Injury Wounding Event: Pressure Ulcer Pressure Ulcer Pressure Ulcer Primary Etiology: Angina, Hypotension Angina, Hypotension Angina, Hypotension Comorbid History: 03/12/2022 03/09/2022 08/12/2022 Date Acquired: 22 22 5  Weeks of Treatment: Healed - Epithelialized Open Healed - Epithelialized Wound Status: No No No Wound Recurrence: 0x0x0 3x1.7x0.7 0x0x0 Measurements L x W x D (cm) 0 4.006 0 A (cm) : rea 0 2.804 0 Volume (cm) : 100.00% 83.00% 100.00% % Reduction in A rea: 100.00% 98.00% 100.00% % Reduction in Volume: 12 Starting Position 1 (o'clock): 12 Ending Position 1 (o'clock): 1.5 Maximum Distance  1 (cm): N/A Yes N/A Undermining: Unstageable/Unclassified Category/Stage IV Category/Stage III Classification: Small Large Medium Exudate A mount: Serous Serosanguineous Serosanguineous Exudate Type: amber red, brown red, brown Exudate Color: N/A Epibole N/A Wound Margin: None Present (0%) Large (67-100%) Medium (34-66%) Granulation A mount: N/A Red, Hyper-granulation Red Granulation Quality: Large (67-100%) Small (1-33%) Medium (34-66%) Necrotic A mount: Eschar N/A N/A Necrotic Tissue: Fat Layer (Subcutaneous Tissue): Yes Fat Layer (Subcutaneous Tissue): Yes Fat Layer (Subcutaneous Tissue): Yes Exposed Structures: Fascia: No Muscle: Yes Fascia: No PHILANA, YOUNIS ([id number]) [id number] Page 5 of 11 Tendon: No Fascia: No Tendon: No Muscle: No Tendon: No Muscle: No Joint: No Joint: No Joint: No Bone: No Bone: No Bone: No None None Small (1-33%) Epithelialization: Treatment Notes Electronic Signature(s) Signed: 10/07/2022 4:17:05 PM By: Midge Aver MSN RN CNS WTA Entered By: Midge Aver on 10/07/2022 16:17:05 -------------------------------------------------------------------------------- Multi-Disciplinary Care Plan  Details Patient Name: Date of Service: [name] STRA ND, [name] 10/07/2022 1:15 PM Medical Record Number: [id number] Patient Account Number: [id number] Date of Birth/Sex: Treating RN: 01/20/1961 (62 y.o. [name] Primary Care Takeira Yanes: Enid Baas Other Clinician: Referring Marquisha Nikolov: Treating Tailor Westfall/Extender: Rica Mote Weeks in Treatment: 22 Active Inactive Wound/Skin Impairment Nursing Diagnoses: Impaired tissue integrity Knowledge deficit related to ulceration/compromised skin integrity Goals: Patient/caregiver will verbalize understanding of skin care regimen Date Initiated: 05/06/2022 Date Inactivated: 07/29/2022 Target Resolution Date: 08/28/2022 Goal Status: Met Ulcer/skin breakdown will have a volume reduction of 30% by week 4 Date Initiated: 05/06/2022 Date Inactivated: 07/29/2022 Target Resolution Date: 06/06/2022 Goal Status: Unmet Unmet Reason: not 30% decrease Ulcer/skin breakdown will have a volume reduction of 50% by week 8 Date Initiated: 05/20/2022 Date Inactivated: 07/29/2022 Target Resolution Date: 07/05/2022 Unmet Reason: not 50% decrease in Goal Status: Unmet size Ulcer/skin breakdown will have a volume reduction of 80% by week 12 Date Initiated: 05/20/2022 Date Inactivated: 07/29/2022 Target Resolution Date: 08/05/2022 Unmet Reason: not 80% decrease in Goal Status: Unmet size Ulcer/skin breakdown will heal within 14 weeks Date Initiated: 05/20/2022 Target Resolution Date: 11/11/2022 Goal Status: Active Interventions: Assess patient/caregiver ability to perform ulcer/skin care regimen upon admission and as needed Assess ulceration(s) every visit Provide education on ulcer and skin care Screen for HBO Treatment Activities: Skin care regimen initiated : 05/06/2022 Topical wound management initiated : 05/06/2022 Notes: SAMIAH, RICKLEFS ([id number]) [id number] Page 6 of 11 Negative Pressure  Wound Therapy Nursing Diagnoses: Knowledge deficit related to use and safety of the device Impaired tissue integrity Goals: Patient/caregiver agrees to and verbalizes understanding of need to use Date Initiated: 09/14/2022 Date Inactivated: 10/07/2022 Target Resolution Date: 10/14/2022 Goal Status: Met Patient/caregiver will verbalize understanding of use of the device Date Initiated: 09/14/2022 Target Resolution Date: 10/14/2022 Goal Status: Active Promote granulation tissue Date Initiated: 09/14/2022 Target Resolution Date: 11/04/2022 Goal Status: Active Reduce inflammatory exudate Date Initiated: 09/14/2022 Target Resolution Date: 10/31/2022 Goal Status: Active Interventions: Assess drainage (amount and color) Assess patient nutrition upon admission and as needed per policy Assess patient understanding of disease process and management Assess patient/caregiver ability to obtain necessary supplies Assess wound bed for efficacy of treatment Monitor and protect skin around the wound Provide education on nutrition Provide education on use, care, and troubleshooting Treatment Activities: Incontinence or moisture care in place (i.e., Barrier creams, foley catheter, colostomy, diapers, etc.) : 09/02/2022 Patient referred to home health care : 09/02/2022 Referred to DME Johnathin Vanderschaaf for dressing supplies : 09/02/2022 Support surface (Group 2/3) in use : 09/02/2022 Turning and Repositioning schedule in place :  09/02/2022 Notes: Electronic Signature(s) Signed: 10/07/2022 4:21:14 PM By: Midge Aver MSN RN CNS WTA Entered By: Midge Aver on 10/07/2022 16:21:14 -------------------------------------------------------------------------------- Pain Assessment Details Patient Name: Date of Service: [name] ND, [name] 10/07/2022 1:15 PM Medical Record Number: [id number] Patient Account Number: [id number] Date of Birth/Sex: Treating RN: 11/12/1960 (62 y.o. [name] Primary Care Saylor Sheckler:  Enid Baas Other Clinician: Referring Izacc Demeyer: Treating Marchella Hibbard/Extender: Rica Mote Weeks in Treatment: 22 Active Problems Location of Pain Severity and Description of Pain Patient Has Paino No Site Locations [place], [place] ([id number]) [id number] Page 7 of 11 Pain Management and Medication Current Pain Management: Electronic Signature(s) Signed: 10/07/2022 4:45:45 PM By: Midge Aver MSN RN CNS WTA Entered By: Midge Aver on 10/07/2022 13:50:10 -------------------------------------------------------------------------------- Patient/Caregiver Education Details Patient Name: Date of Service: [name] ND, [name] 6/26/2024andnbsp1:15 PM Medical Record Number: [id number] Patient Account Number: [id number] Date of Birth/Gender: Treating RN: 11/12/1960 (62 y.o. [name] Primary Care Physician: Enid Baas Other Clinician: Referring Physician: Treating Physician/Extender: Rica Mote Weeks in Treatment: 22 Education Assessment Education Provided To: Patient Education Topics Provided Wound/Skin Impairment: Handouts: Caring for Your Ulcer Methods: Explain/Verbal Responses: State content correctly Electronic Signature(s) Signed: 10/07/2022 4:45:45 PM By: Midge Aver MSN RN CNS WTA Entered By: Midge Aver on 10/07/2022 16:21:27 [name] ([id number]) [id number] Page 8 of 11 -------------------------------------------------------------------------------- Wound Assessment Details Patient Name: Date of Service: [name] ND, [name] 10/07/2022 1:15 PM Medical Record Number: [id number] Patient Account Number: [id number] Date of Birth/Sex: Treating RN: 1960-05-19 (62 y.o. [name] Primary Care Kela Baccari: Enid Baas Other Clinician: Referring Shikara Mcauliffe: Treating Garvin Ellena/Extender: Rica Mote Weeks in Treatment: 22 Wound Status Wound Number: 2 Primary Etiology: Pressure Ulcer Wound Location: Left Calcaneus Wound Status: Healed - Epithelialized Wounding Event: Pressure Injury Comorbid History: Angina, Hypotension Date Acquired: 03/12/2022 Weeks Of Treatment: 22 Clustered Wound: No Wound Measurements Length: (cm) Width: (cm) Depth: (cm) Area: (cm) Volume: (cm) 0 % Reduction in Area: 100% 0 % Reduction in Volume: 100% 0 Epithelialization: None 0 0 Wound Description Classification: Unstageable/Unclassified Exudate Amount: Small Exudate Type: Serous Exudate Color: amber Foul Odor After Cleansing: No Slough/Fibrino Yes Wound Bed Granulation Amount: None Present (0%) Exposed Structure Necrotic Amount: Large (67-100%) Fascia Exposed: No Necrotic Quality: Eschar Fat Layer (Subcutaneous Tissue) Exposed: Yes Tendon Exposed: No Muscle Exposed: No Joint Exposed: No Bone Exposed: No Treatment Notes Wound #2 (Calcaneus) Wound Laterality: Left Cleanser Peri-Wound Care Topical Primary Dressing Secondary Dressing Secured With Compression Wrap Compression Stockings Add-Ons Electronic Signature(s) Signed: 10/07/2022 4:45:45 PM By: Midge Aver MSN RN CNS WTA Entered By: Midge Aver on 10/07/2022 14:12:52 [name] ([id number]) [id number] Page 9 of 11 -------------------------------------------------------------------------------- Wound Assessment Details Patient Name: Date of Service: [name] ND, [name] 10/07/2022 1:15 PM Medical Record Number: [id number] Patient Account Number: [id number] Date of Birth/Sex: Treating RN: 14-Jul-1960 (62 y.o. [name] Primary Care Lanora Reveron: Enid Baas Other Clinician: Referring Dashawn Bartnick: Treating Miles Borkowski/Extender: Rica Mote Weeks in Treatment: 22 Wound Status Wound Number: 3 Primary Etiology: Pressure Ulcer Wound Location:  Proximal, Midline Sacrum Wound Status: Open Wounding Event: Pressure Injury Comorbid History: Angina, Hypotension Date Acquired: 03/09/2022 Weeks Of Treatment: 22 Clustered Wound: No Photos Wound Measurements Length: (cm) 3 Width: (cm) 1.7 Depth: (cm) 0.7 Area: (cm) 4.006 Volume: (cm) 2.804 % Reduction in Area: 83% % Reduction in Volume: 98% Epithelialization: None Tunneling: No Undermining: Yes Starting Position (o'clock): 12 Ending  Position (o'clock): 12 Maximum Distance: (cm) 1.5 Wound Description Classification: Category/Stage IV Wound Margin: Epibole Exudate Amount: Large Exudate Type: Serosanguineous Exudate Color: red, brown Foul Odor After Cleansing: No Slough/Fibrino Yes Wound Bed Granulation Amount: Large (67-100%) Exposed Structure Granulation Quality: Red, Hyper-granulation Fascia Exposed: No Necrotic Amount: Small (1-33%) Fat Layer (Subcutaneous Tissue) Exposed: Yes Tendon Exposed: No Muscle Exposed: Yes Necrosis of Muscle: No Joint Exposed: No Bone Exposed: No Treatment Notes KENNEDE, LUSK ([id number]) [id number] Page 10 of 11 Wound #3 (Sacrum) Wound Laterality: Midline, Proximal Cleanser Vashe 5.8 (oz) Discharge Instruction: Use vashe 5.8 (oz) as directed Peri-Wound Care Topical Primary Dressing Secondary Dressing Secured With Compression Wrap Compression Stockings Add-Ons Wound Vac Electronic Signature(s) Signed: 10/07/2022 4:45:45 PM By: Midge Aver MSN RN CNS WTA Entered By: Midge Aver on 10/07/2022 14:07:09 -------------------------------------------------------------------------------- Wound Assessment Details Patient Name: Date of Service: [name] ND, [name] 10/07/2022 1:15 PM Medical Record Number: [id number] Patient Account Number: [id number] Date of Birth/Sex: Treating RN: 1960-07-10 (62 y.o. [name] Primary Care Murriel Eidem: Enid Baas Other Clinician: Referring  Hasana Alcorta: Treating Nannette Zill/Extender: Rica Mote Weeks in Treatment: 22 Wound Status Wound Number: 6 Primary Etiology: Pressure Ulcer Wound Location: Left, Medial Ankle Wound Status: Healed - Epithelialized Wounding Event: Pressure Injury Comorbid History: Angina, Hypotension Date Acquired: 08/12/2022 Weeks Of Treatment: 5 Clustered Wound: No Wound Measurements Length: (cm) Width: (cm) Depth: (cm) Area: (cm) Volume: (cm) 0 % Reduction in Area: 100% 0 % Reduction in Volume: 100% 0 Epithelialization: Small (1-33%) 0 0 Wound Description Classification: Category/Stage III Exudate Amount: Medium Exudate Type: Serosanguineous Exudate Color: red, brown Foul Odor After Cleansing: No Slough/Fibrino Yes Wound Bed Granulation Amount: Medium (34-66%) Exposed Structure Granulation Quality: Red Fascia Exposed: No Necrotic Amount: Medium (34-66%) Fat Layer (Subcutaneous Tissue) Exposed: Yes Tendon Exposed: No Muscle Exposed: No [name] ([id number]) [id number] Page 11 of 11 Joint Exposed: No Bone Exposed: No Treatment Notes Wound #6 (Ankle) Wound Laterality: Left, Medial Cleanser Peri-Wound Care Topical Primary Dressing Secondary Dressing Secured With Compression Wrap Compression Stockings Add-Ons Electronic Signature(s) Signed: 10/07/2022 4:45:45 PM By: Midge Aver MSN RN CNS WTA Entered By: Midge Aver on 10/07/2022 14:12:56 -------------------------------------------------------------------------------- Vitals Details Patient Name: Date of Service: [name] ND, [name] 10/07/2022 1:15 PM Medical Record Number: [id number] Patient Account Number: [id number] Date of Birth/Sex: Treating RN: [birth year] (62 y.o. [name] Primary Care Kimra Kantor: Enid Baas Other Clinician: Referring Tamanna Whitson: Treating Dennies Coate/Extender: Rica Mote Weeks in Treatment: 22 Vital  Signs Time Taken: 13:49 Temperature (F): 98.0 Pulse (bpm): 87 Respiratory Rate (breaths/min): 16 Blood Pressure (mmHg): 100/82 Reference Range: 80 - 120 mg / dl Electronic Signature(s) Signed: 10/07/2022 4:45:45 PM By: Midge Aver MSN RN CNS WTA Entered By: Midge Aver on 10/07/2022 13:50:04

## 2022-10-07 NOTE — Progress Notes (Addendum)
[name] ([id number]) [id number] Page 1 of 8 Visit Report for 10/07/2022 Chief Complaint Document Details Patient Name: Date of Service: [name] STRA ND, [name] 10/07/2022 1:15 PM Medical Record Number: [id number] Patient Account Number: [id number] Date of Birth/Sex: Treating RN: 15-Dec-1960 (62 y.o. [name] Primary Care Provider: Enid Baas Other Clinician: Referring Provider: Treating Provider/Extender: Rica Mote Weeks in Treatment: 22 Information Obtained from: Patient Chief Complaint 05/06/2022; Bilateral feet wounds and sacral wounds Electronic Signature(s) Signed: 10/07/2022 4:25:37 PM By: Geralyn Corwin DO Entered By: Geralyn Corwin on 10/07/2022 14:16:15 -------------------------------------------------------------------------------- HPI Details Patient Name: Date of Service: [name] ND, [name] 10/07/2022 1:15 PM Medical Record Number: [id number] Patient Account Number: [id number] Date of Birth/Sex: Treating RN: Feb 27, 1961 (62 y.o. [name] Primary Care Provider: Enid Baas Other Clinician: Referring Provider: Treating Provider/Extender: Rica Mote Weeks in Treatment: 22 History of Present Illness HPI Description: 05/06/2022 [name] is a 62 year old female with a past medical history of multiple sclerosis and PEs and DVT that presents to the clinic for sacral s wounds and Bilateral feet wounds. Patient states that she obtained the sacral ulcers when she was hospitalized in November 2023 for bilateral pulmonary embolisms. She was in the ICU on a ventilator. Since discharge she has been using Dakin's wet-to-dry dressings to the area. Since her hospitalization she has been bedbound. However she is doing physical therapy. Prior to being hospitalized she was able to pull herself up and transfer from bed to chair on her own. Due to  her MS she is not fully mobile. Unfortunately she states that the wound has gotten larger on her sacrum despite Wound care. Patient has home health. On 04/16/2022 she was admitted to the hospital for severe sepsis secondary to acute UTI and sacral cellulitis. She completed 5 days of IV cefepime and vancomycin. She was not discharged with oral antibiotics. She states she developed the bilateral feet wounds at that time. She states she has bunny boots however she is not wearing them. She currently denies systemic signs of infection. 2/7; patient presents for follow-up. She had a bone biopsy done at last clinic visit that showed fragments of inflamed granulation tissue, necrotic material and bone with acute osteomyelitis. Culture grew Proteus mirabilis and Klebsiella pneumoniae. Patient is scheduled to see infectious disease next week. For now she has been doing Dakin's wet-to-dry dressings to the sacrum. She is not able to obtain Santyl and has been keeping the left heel covered. T the right foot where o there was a previous wound with scab however this has healed. This has remained closed. No open wound noted. She states over the past week she has had chills but no fevers, nausea/vomiting or purulent drainage. 2/21; patient presents for follow-up. She saw Dr. Joylene Draft on 2/15. 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 Today she had feces [name] ([id number]) [id number] Page 2 of 8 impacted in the wound and throughout the tunneling. We discussed a diverting colostomy to address this issue. She was agreeable with a referral to general surgery. She currently denies systemic signs of infection. She has been using Medihoney and Hydrofera Blue to the left heel wound. She has been using her Prevalon boots. 3/20; patient presents for follow-up. She has been using Dakin's wet-to-dry dressings to the sacral wound. She has developed a new wound to  her right lateral heel. She is using Medihoney and Hydrofera Blue to the left heel. She is  currently taking Levaquin to complete 6 weeks. She follows with infectious disease for this. She saw general surgery at [facility] to discuss potentially doing a diverting colostomy however per patient's surgeon did not recommend this. 4/17; patient presents for follow-up. Has been using Dakin's wet-to-dry dressings to the sacral wound. She has been using Medihoney and Hydrofera Blue to the heel wounds. She states she is using her bunny boots for offloading the heels. She has not heard from plastic surgery for consultation. We gave her the number to call to follow this up. 5/22; patient presents for follow-up. She saw Dr. Ferd Hibbs on 5/8, plastic surgery to discuss potentially doing surgical debridement with muscle flap. Per Dr. Timothy Lasso note Review of the surgery and requirements were discussed with the patient. Patient would like to hold off on proceeding with this option. Wound VAC was recommended and based on the wound today this option seemed reasonable to get started. Patient has home health that will be able to change the wound VAC. She has been using Dakin's wet-to-dry dressings to the sacrum. Patient has a new wound to the left medial ankle. She states she wears bunny boots but does not while in the wheelchair. 6/1; the patient has started the wound VAC as of Monday to the sacral area. Home health is out to change the dressing. She also has wounds on her bilateral ankles and left heel she is using Medihoney and Hydrofera Blue to these areas. She has advanced MS with contractures. Both the patient and her husband say she is eating a high-protein dilated well. She has some form of air mattress but the bed is from sleep number. I am uncertain about the offloading part of this 6/12; patient presents for follow-up. She has had a wound VAC to the sacral wound and home health has been changing this. She has had no  issues with this. She has been using Hydrofera Blue and Medihoney to the bilateral ankle and left heel wounds. The right ankle wound is healed. 6/26; patient presents for follow-up. Has been using a wound VAC to the sacral wound. She has no issues with this. Wound is smaller. She had ABIs on the right that were 1.27 and on the left that was 1.35 with a TBI of 1.18. She had triphasic waveforms throughout. She is been using Hydrofera Blue and Medihoney to the left heel wounds. These have closed. She denies signs of infection. Electronic Signature(s) Signed: 10/07/2022 4:25:37 PM By: Geralyn Corwin DO Entered By: Geralyn Corwin on 10/07/2022 14:18:29 -------------------------------------------------------------------------------- Physical Exam Details Patient Name: Date of Service: [name] ND, [name] 10/07/2022 1:15 PM Medical Record Number: [id number] Patient Account Number: [id number] Date of Birth/Sex: Treating RN: 12-Oct-1960 (62 y.o. [name] Primary Care Provider: Enid Baas Other Clinician: Referring Provider: Treating Provider/Extender: Rica Mote Weeks in Treatment: 22 Constitutional . Cardiovascular . Psychiatric . Notes T the sacrum there is granulation tissue with no exposed bone. Undermining from 16:00. No signs of surrounding soft tissue infection. Epithelization to the o previous wound sites on the left foot. No signs of surrounding infection. Electronic Signature(s) Signed: 10/07/2022 4:25:37 PM By: Geralyn Corwin DO Entered By: Geralyn Corwin on 10/07/2022 14:18:33 [name] ([id number]) [id number] Page 3 of 8 -------------------------------------------------------------------------------- Physician Orders Details Patient Name: Date of Service: [name] ND, [name] 10/07/2022 1:15 PM Medical Record Number: [id number] Patient Account Number: [id number] Date of  Birth/Sex: Treating RN: 02-Jun-1960 (62 y.o. [name] Primary Care Provider:  Enid Baas Other Clinician: Referring Provider: Treating Provider/Extender: Rica Mote Weeks in Treatment: 52 Verbal / Phone Orders: No Diagnosis Coding Home Health CONTINUE Home Health for wound care. May utilize formulary equivalent dressing for wound treatment orders unless otherwise specified. Home Health Nurse may visit PRN to address patients wound care needs. - ADORATION **Please direct any NON-WOUND related issues/requests for orders to patient's Primary Care Physician. **If current dressing causes regression in wound condition, may D/C ordered dressing product/s and apply Normal Saline Moist Dressing daily until next Wound Healing Center or Other MD appointment. **Notify Wound Healing Center of regression in wound condition at [phone]. Negative Pressure Wound Therapy Wound #3 Proximal,Midline Sacrum Wound VAC settings at continuous pressure. Use foam to wound cavity. Please order WHITE foam to fill any tunnel/s and/or undermining when necessary. Change VAC dressing 3 X WEEK. Change canister as indicated when full. - white foam in tunnels followed by black foam , Wound vac to be applied by home health nurse for Adoration, when patient is seen in clinic wound vac will be removed and replaced with wet to dry dressing , home health to reapply wound vac that same day . Home Health Nurse may d/c VAC for s/s of increased infection, significant wound regression, or uncontrolled drainage. Notify Wound Healing Center at [phone]. - ADORATION Wound Treatment Wound #3 - Sacrum Wound Laterality: Midline, Proximal Cleanser: Vashe 5.8 (oz) 3 x Per Week/30 Days Discharge Instructions: Use vashe 5.8 (oz) as directed Add-Ons: Wound Vac 3 x Per Week/30 Days Electronic Signature(s) Signed: 10/07/2022 4:24:07 PM By: Midge Aver MSN RN CNS WTA Signed: 10/07/2022 4:25:37  PM By: Geralyn Corwin DO Previous Signature: 10/07/2022 4:17:24 PM Version By: Midge Aver MSN RN CNS WTA Entered By: Midge Aver on 10/07/2022 16:24:06 -------------------------------------------------------------------------------- Problem List Details Patient Name: Date of Service: [name] ND, [name] 10/07/2022 1:15 PM Medical Record Number: [id number] Patient Account Number: [id number] Date of Birth/Sex: Treating RN: April 23, 1960 (62 y.o. [name] Primary Care Provider: Enid Baas Other Clinician: Referring Provider: Treating Provider/Extender: Rica Mote Weeks in Treatment: [address], [place] ([id number]) [id number] Page 4 of 8 Active Problems ICD-10 Encounter Code Description Active Date MDM Diagnosis L89.154 Pressure ulcer of sacral region, stage 4 05/06/2022 No Yes L89.620 Pressure ulcer of left heel, unstageable 05/06/2022 No Yes L89.510 Pressure ulcer of right ankle, unstageable 09/02/2022 No Yes L89.523 Pressure ulcer of left ankle, stage 3 09/02/2022 No Yes I26.99 Other pulmonary embolism without acute cor pulmonale 05/06/2022 No Yes 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, sacral and sacrococcygeal region 05/20/2022 No Yes G35 Multiple sclerosis 05/06/2022 No Yes I82.409 Acute embolism and thrombosis of unspecified deep veins of unspecified 05/06/2022 No Yes lower extremity Z79.01 Long term (current) use of anticoagulants 05/06/2022 No Yes Inactive Problems ICD-10 Code Description Active Date Inactive Date L89.153 Pressure ulcer of sacral region, stage 3 05/06/2022 05/06/2022 Resolved Problems Electronic Signature(s) Signed: 10/07/2022 4:25:37 PM By: Geralyn Corwin DO Entered By: Geralyn Corwin on 10/07/2022 14:16:12 -------------------------------------------------------------------------------- Progress Note Details Patient Name: Date of Service: [name] ND, [name] 10/07/2022 1:15  PM [name] ([id number]) [id number] Page 5 of 8 Medical Record Number: [id number] Patient Account Number: [id number] Date of Birth/Sex: Treating RN: [birth year] (62 y.o. [name] Primary Care Provider: Enid Baas Other Clinician: Referring Provider: Treating Provider/Extender: Rica Mote Weeks in Treatment: 22 Subjective Chief Complaint Information obtained from Patient 05/06/2022; Bilateral feet wounds and sacral wounds History of Present Illness (HPI)  05/06/2022 [name] is a 62 year old female with a past medical history of multiple sclerosis and PEs and DVT that presents to the clinic for sacral s wounds and Bilateral feet wounds. Patient states that she obtained the sacral ulcers when she was hospitalized in November 2023 for bilateral pulmonary embolisms. She was in the ICU on a ventilator. Since discharge she has been using Dakin's wet-to-dry dressings to the area. Since her hospitalization she has been bedbound. However she is doing physical therapy. Prior to being hospitalized she was able to pull herself up and transfer from bed to chair on her own. Due to her MS she is not fully mobile. Unfortunately she states that the wound has gotten larger on her sacrum despite Wound care. Patient has home health. On 04/16/2022 she was admitted to the hospital for severe sepsis secondary to acute UTI and sacral cellulitis. She completed 5 days of IV cefepime and vancomycin. She was not discharged with oral antibiotics. She states she developed the bilateral feet wounds at that time. She states she has bunny boots however she is not wearing them. She currently denies systemic signs of infection. 2/7; patient presents for follow-up. She had a bone biopsy done at last clinic visit that showed fragments of inflamed granulation tissue, necrotic material and bone with acute osteomyelitis. Culture grew  Proteus mirabilis and Klebsiella pneumoniae. Patient is scheduled to see infectious disease next week. For now she has been doing Dakin's wet-to-dry dressings to the sacrum. She is not able to obtain Santyl and has been keeping the left heel covered. T the right foot where o there was a previous wound with scab however this has healed. This has remained closed. No open wound noted. She states over the past week she has had chills but no fevers, nausea/vomiting or purulent drainage. 2/21; patient presents for follow-up. She saw Dr. Joylene Draft on 2/15. 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 Today she had feces impacted in the wound and throughout the tunneling. We discussed a diverting colostomy to address this issue. She was agreeable with a referral to general surgery. She currently denies systemic signs of infection. She has been using Medihoney and Hydrofera Blue to the left heel wound. She has been using her Prevalon boots. 3/20; patient presents for follow-up. She has been using Dakin's wet-to-dry dressings to the sacral wound. She has developed a new wound to her right lateral heel. She is using Medihoney and Hydrofera Blue to the left heel. She is currently taking Levaquin to complete 6 weeks. She follows with infectious disease for this. She saw general surgery at [facility] to discuss potentially doing a diverting colostomy however per patient's surgeon did not recommend this. 4/17; patient presents for follow-up. Has been using Dakin's wet-to-dry dressings to the sacral wound. She has been using Medihoney and Hydrofera Blue to the heel wounds. She states she is using her bunny boots for offloading the heels. She has not heard from plastic surgery for consultation. We gave her the number to call to follow this up. 5/22; patient presents for follow-up. She saw Dr. Ferd Hibbs on 5/8, plastic surgery to discuss potentially doing surgical debridement with muscle flap. Per  Dr. Timothy Lasso note Review of the surgery and requirements were discussed with the patient. Patient would like to hold off on proceeding with this option. Wound VAC was recommended and based on the wound today this option seemed reasonable to get started. Patient has home health that will be able to change  the wound VAC. She has been using Dakin's wet-to-dry dressings to the sacrum. Patient has a new wound to the left medial ankle. She states she wears bunny boots but does not while in the wheelchair. 6/1; the patient has started the wound VAC as of Monday to the sacral area. Home health is out to change the dressing. She also has wounds on her bilateral ankles and left heel she is using Medihoney and Hydrofera Blue to these areas. She has advanced MS with contractures. Both the patient and her husband say she is eating a high-protein dilated well. She has some form of air mattress but the bed is from sleep number. I am uncertain about the offloading part of this 6/12; patient presents for follow-up. She has had a wound VAC to the sacral wound and home health has been changing this. She has had no issues with this. She has been using Hydrofera Blue and Medihoney to the bilateral ankle and left heel wounds. The right ankle wound is healed. 6/26; patient presents for follow-up. Has been using a wound VAC to the sacral wound. She has no issues with this. Wound is smaller. She had ABIs on the right that were 1.27 and on the left that was 1.35 with a TBI of 1.18. She had triphasic waveforms throughout. She is been using Hydrofera Blue and Medihoney to the left heel wounds. These have closed. She denies signs of infection. Objective Constitutional Vitals Time Taken: 1:49 PM, Temperature: 98.0 F, Pulse: 87 bpm, Respiratory Rate: 16 breaths/min, Blood Pressure: 100/82 mmHg. General Notes: T the sacrum there is granulation tissue with no exposed bone. Undermining from 16:00. No signs of surrounding soft  tissue infection. o Epithelization to the previous wound sites on the left foot. No signs of surrounding infection. Integumentary (Hair, Skin) Wound #2 status is Healed - Epithelialized. Original cause of wound was Pressure Injury. The date acquired was: 03/12/2022. The wound has been in treatment 22 weeks. The wound is located on the Left Calcaneus. The wound measures 0cm length x 0cm width x 0cm depth; 0cm^2 area and 0cm^3 volume. There is Fat Layer (Subcutaneous Tissue) exposed. There is a small amount of serous drainage noted. There is no granulation within the wound bed. There is a large (67- 100%) amount of necrotic tissue within the wound bed including Eschar. Wound #3 status is Open. Original cause of wound was Pressure Injury. The date acquired was: 03/09/2022. The wound has been in treatment 22 weeks. The wound is located on the Proximal,Midline Sacrum. The wound measures 3cm length x 1.7cm width x 0.7cm depth; 4.006cm^2 area and 2.804cm^3 volume. [name], [name] ([id number]) [id number] Page 6 of 8 There is muscle and Fat Layer (Subcutaneous Tissue) exposed. There is no tunneling noted, however, there is undermining starting at 12:00 and ending at 12:00 with a maximum distance of 1.5cm. There is a large amount of serosanguineous drainage noted. The wound margin is epibole. There is large (67-100%) red, hyper - granulation within the wound bed. There is a small (1-33%) amount of necrotic tissue within the wound bed. Wound #6 status is Healed - Epithelialized. Original cause of wound was Pressure Injury. The date acquired was: 08/12/2022. The wound has been in treatment 5 weeks. The wound is located on the Left,Medial Ankle. The wound measures 0cm length x 0cm width x 0cm depth; 0cm^2 area and 0cm^3 volume. There is Fat Layer (Subcutaneous Tissue) exposed. There is a medium amount of serosanguineous drainage noted. There is medium (34-66%) red granulation  within the wound bed. There is a medium (34-66%) amount of necrotic tissue within the wound bed. Assessment Active Problems ICD-10 Pressure ulcer of sacral region, stage 4 Pressure ulcer of left heel, unstageable Pressure ulcer of right ankle, unstageable Pressure ulcer of left ankle, stage 3 Other pulmonary embolism without acute cor pulmonale Osteomyelitis of vertebra, sacral and sacrococcygeal region Multiple sclerosis Acute embolism and thrombosis of unspecified deep veins of unspecified lower extremity Long term (current) use of anticoagulants Patient has healed the left foot wounds. Her sacral wound appears well-healing. I recommended continuing with the wound VAC. She did admit to me up in her chair for over 6 hours yesterday. I recommended reposition every 1-2 hours and aggressive offloading. Continue bunny boots to prevent future feet wounds. Follow-up in 2 weeks. Plan Home Health: [facility] for wound care. May utilize formulary equivalent dressing for wound treatment orders unless otherwise specified. Home Health Nurse may visit PRN to address patients wound care needs. - ADORATION **Please direct any NON-WOUND related issues/requests for orders to patient's Primary Care Physician. **If current dressing causes regression in wound condition, may D/C ordered dressing product/s and apply Normal Saline Moist Dressing daily until next Wound Healing Center or Other MD appointment. **Notify Wound Healing Center of regression in wound condition at [phone]. Negative Pressure Wound Therapy: Wound #3 Proximal,Midline Sacrum: Wound VAC settings at continuous pressure. Use foam to wound cavity. Please order WHITE foam to fill any tunnel/s and/or undermining when necessary. Change VAC dressing 3 X WEEK. Change canister as indicated when full. - white foam in tunnels followed by black foam , Wound vac to be applied by home health nurse for Adoration, when patient is  seen in clinic wound vac will be removed and replaced with wet to dry dressing , home health to reapply wound vac that same day . Home Health Nurse may d/c VAC for s/s of increased infection, significant wound regression, or uncontrolled drainage. Notify Wound Healing Center at [phone]. - ADORATION WOUND #3: - Sacrum Wound Laterality: Midline, Proximal Cleanser: Vashe 5.8 (oz) 3 x Per Week/30 Days Discharge Instructions: Use vashe 5.8 (oz) as directed Add-Ons: Wound Vac 3 x Per Week/30 Days 1. Continue wound VAC 2. Aggressive offloading 3. Bunny boots 4. Follow-up in 2 weeks Electronic Signature(s) Signed: 10/07/2022 4:25:37 PM By: Geralyn Corwin DO Entered By: Geralyn Corwin on 10/07/2022 14:20:07 [name] ([id number]) [id number] Page 7 of 8 -------------------------------------------------------------------------------- ROS/PFSH Details Patient Name: Date of Service: [name] ND, [name] 10/07/2022 1:15 PM Medical Record Number: [id number] Patient Account Number: [id number] Date of Birth/Sex: Treating RN: 05-03-1960 (62 y.o. [name] Primary Care Provider: Enid Baas Other Clinician: Referring Provider: Treating Provider/Extender: Rica Mote Weeks in Treatment: 22 Information Obtained From Patient Respiratory Medical History: Past Medical History Notes: pulmonary hemorrage Cardiovascular Medical History: Positive for: Angina; Hypotension Endocrine Medical History: Negative for: Type II Diabetes Integumentary (Skin) Medical History: Negative for: History of pressure wounds Neurologic Medical History: Past Medical History Notes: MS Immunizations Pneumococcal Vaccine: Received Pneumococcal Vaccination: No Implantable Devices None Family and Social History Never smoker; Marital Status - Widowed; Alcohol Use: Never; Caffeine Use: Daily Electronic Signature(s) Signed:  10/07/2022 4:25:37 PM By: Geralyn Corwin DO Signed: 10/07/2022 4:45:45 PM By: Midge Aver MSN RN CNS WTA Entered By: Geralyn Corwin on 10/07/2022 14:20:39 -------------------------------------------------------------------------------- SuperBill Details Patient Name: Date of Service: [name] ND, [name] 10/07/2022 Medical Record Number: [id number] Patient Account Number: [id number] Date of Birth/Sex:  Treating RN: 25-Nov-1960 (62 y.o. [name] Primary Care Provider: Enid Baas Other Clinician: Referring Provider: Treating Provider/Extender: Rica Mote [place], [place] ([id number]) [id number] Page 8 of 8 Weeks in Treatment: 22 Diagnosis Coding ICD-10 Codes Code Description L89.154 Pressure ulcer of sacral region, stage 4 L89.620 Pressure ulcer of left heel, unstageable L89.510 Pressure ulcer of right ankle, unstageable L89.523 Pressure ulcer of left ankle, stage 3 I26.99 Other pulmonary embolism without acute cor pulmonale 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, sacral and sacrococcygeal region G35 Multiple sclerosis I82.409 Acute embolism and thrombosis of unspecified deep veins of unspecified lower extremity Z79.01 Long term (current) use of anticoagulants Facility Procedures : CPT4 Code: 40981191 Description: 99214 - WOUND CARE VISIT-LEV 4 EST PT Modifier: Quantity: 1 Physician Procedures : CPT4 Code Description Modifier [id number] 99213 - WC PHYS LEVEL 3 - EST PT ICD-10 Diagnosis Description L89.154 Pressure ulcer of sacral region, stage 4 L89.620 Pressure ulcer of left heel, unstageable L89.523 Pressure ulcer of left ankle, stage 3 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

## 2022-10-21 ENCOUNTER — Telehealth: Payer: Self-pay

## 2022-10-21 ENCOUNTER — Encounter: Payer: Medicare Other | Attending: Internal Medicine | Admitting: Internal Medicine

## 2022-10-21 DIAGNOSIS — Z7901 Long term (current) use of anticoagulants: Secondary | ICD-10-CM | POA: Diagnosis not present

## 2022-10-21 DIAGNOSIS — M4628 Osteomyelitis of vertebra, sacral and sacrococcygeal region: Secondary | ICD-10-CM | POA: Diagnosis not present

## 2022-10-21 DIAGNOSIS — Z86711 Personal history of pulmonary embolism: Secondary | ICD-10-CM | POA: Insufficient documentation

## 2022-10-21 DIAGNOSIS — Z86718 Personal history of other venous thrombosis and embolism: Secondary | ICD-10-CM | POA: Insufficient documentation

## 2022-10-21 DIAGNOSIS — I2699 Other pulmonary embolism without acute cor pulmonale: Secondary | ICD-10-CM | POA: Insufficient documentation

## 2022-10-21 DIAGNOSIS — G35 Multiple sclerosis: Secondary | ICD-10-CM | POA: Diagnosis not present

## 2022-10-21 DIAGNOSIS — L89154 Pressure ulcer of sacral region, stage 4: Secondary | ICD-10-CM | POA: Diagnosis present

## 2022-10-21 DIAGNOSIS — M868X8 Other osteomyelitis, other site: Secondary | ICD-10-CM | POA: Insufficient documentation

## 2022-10-21 NOTE — Telephone Encounter (Signed)
-----   Message from Rickard Patience, MD sent at 10/21/2022  8:34 AM EDT ----- Please arrange her to do a follow up appt with me, MD first. Non urgent. Thank you

## 2022-10-21 NOTE — Telephone Encounter (Signed)
Please contact pt to schedule MD (non urgent appt).

## 2022-10-23 NOTE — Progress Notes (Signed)
[name], [name] ([id number]) [id number] Page 1 of 9 Visit Report for 10/21/2022 Arrival Information Details Patient Name: Date of Service: [name] STRA ND, [name] 10/21/2022 12:30 PM Medical Record Number: [id number] Patient Account Number: [id number] Date of Birth/Sex: Treating RN: 05-Sep-1960 (62 y.o. [name] Primary Care Jaidalyn Schillo: Enid Baas Other Clinician: Referring Bettey Muraoka: Treating Nicholle Falzon/Extender: Rica Mote Weeks in Treatment: 24 Visit Information History Since Last Visit Added or deleted any medications: No Patient Arrived: Wheel Chair Any new allergies or adverse reactions: No Arrival Time: 13:00 Has Dressing in Place as Prescribed: Yes Accompanied By: boyfriend Pain Present Now: No Transfer Assistance: Nurse, adult Patient Identification Verified: Yes Secondary Verification Process Completed: Yes Patient Requires Transmission-Based Precautions: No Patient Has Alerts: Yes Patient Alerts: Patient on Blood Thinner Not Diabetic Xarelto Electronic Signature(s) Signed: 10/22/2022 5:16:05 PM By: Midge Aver MSN RN CNS WTA Entered By: Midge Aver on 10/21/2022 13:01:21 -------------------------------------------------------------------------------- Clinic Level of Care Assessment Details Patient Name: Date of Service: [name], [name] 10/21/2022 12:30 PM Medical Record Number: [id number] Patient Account Number: [id number] Date of Birth/Sex: Treating RN: [birth year] (62 y.o. [name] Primary Care Eastyn Skalla: Enid Baas Other Clinician: Referring Damonie Ellenwood: Treating Mailin Coglianese/Extender: Rica Mote Weeks in Treatment: 24 Clinic Level of Care Assessment Items TOOL 4 Quantity Score X- 1 0 Use when only an EandM is performed on FOLLOW-UP visit ASSESSMENTS - Nursing Assessment / Reassessment X- 1 10 Reassessment of Co-morbidities (includes updates in  patient status) X- 1 5 Reassessment of Adherence to Treatment Plan ASSESSMENTS - Wound and Skin A ssessment / Reassessment X - Simple Wound Assessment / Reassessment - one wound 1 5 Polo, Makinzy ([id number]) [phone].pdf Page 2 of 9 []  - 0 Complex Wound Assessment / Reassessment - multiple wounds []  - 0 Dermatologic / Skin Assessment (not related to wound area) ASSESSMENTS - Focused Assessment []  - 0 Circumferential Edema Measurements - multi extremities []  - 0 Nutritional Assessment / Counseling / Intervention []  - 0 Lower Extremity Assessment (monofilament, tuning fork, pulses) []  - 0 Peripheral Arterial Disease Assessment (using hand held doppler) ASSESSMENTS - Ostomy and/or Continence Assessment and Care []  - 0 Incontinence Assessment and Management []  - 0 Ostomy Care Assessment and Management (repouching, etc.) PROCESS - Coordination of Care X - Simple Patient / Family Education for ongoing care 1 15 []  - 0 Complex (extensive) Patient / Family Education for ongoing care X- 1 10 Staff obtains Chiropractor, Records, T Results / Process Orders est []  - 0 Staff telephones HHA, Nursing Homes / Clarify orders / etc []  - 0 Routine Transfer to another Facility (non-emergent condition) []  - 0 Routine Hospital Admission (non-emergent condition) []  - 0 New Admissions / Manufacturing engineer / Ordering NPWT Apligraf, etc. , []  - 0 Emergency Hospital Admission (emergent condition) X- 1 10 Simple Discharge Coordination []  - 0 Complex (extensive) Discharge Coordination PROCESS - Special Needs []  - 0 Pediatric / Minor Patient Management []  - 0 Isolation Patient Management []  - 0 Hearing / Language / Visual special needs []  - 0 Assessment of Community assistance (transportation, D/C planning, etc.) []  - 0 Additional assistance / Altered mentation []  - 0 Support Surface(s) Assessment (bed, cushion, seat, etc.) INTERVENTIONS - Wound  Cleansing / Measurement X - Simple Wound Cleansing - one wound 1 5 []  - 0 Complex Wound Cleansing - multiple wounds X- 1 5 Wound Imaging (photographs - any number of wounds) []  - 0 Wound Tracing (instead of photographs) X-  1 5 Simple Wound Measurement - one wound []  - 0 Complex Wound Measurement - multiple wounds INTERVENTIONS - Wound Dressings []  - 0 Small Wound Dressing one or multiple wounds X- 1 15 Medium Wound Dressing one or multiple wounds []  - 0 Large Wound Dressing one or multiple wounds []  - 0 Application of Medications - topical []  - 0 Application of Medications - injection INTERVENTIONS - Miscellaneous []  - 0 External ear exam []  - 0 Specimen Collection (cultures, biopsies, blood, body fluids, etc.) [name], [name] ([id number]) [phone].pdf Page 3 of 9 []  - 0 Specimen(s) / Culture(s) sent or taken to Lab for analysis []  - 0 Patient Transfer (multiple staff / Michiel Sites Lift / Similar devices) []  - 0 Simple Staple / Suture removal (25 or less) []  - 0 Complex Staple / Suture removal (26 or more) []  - 0 Hypo / Hyperglycemic Management (close monitor of Blood Glucose) []  - 0 Ankle / Brachial Index (ABI) - do not check if billed separately X- 1 5 Vital Signs Has the patient been seen at the hospital within the last three years: Yes Total Score: 90 Level Of Care: New/Established - Level 3 Electronic Signature(s) Signed: 10/22/2022 5:16:05 PM By: Midge Aver MSN RN CNS WTA Entered By: Midge Aver on 10/21/2022 13:17:22 -------------------------------------------------------------------------------- Lower Extremity Assessment Details Patient Name: Date of Service: [name], [name] 10/21/2022 12:30 PM Medical Record Number: [id number] Patient Account Number: [id number] Date of Birth/Sex: Treating RN: 1960-09-30 (62 y.o. [name] Primary Care Gergory Biello: Enid Baas Other Clinician: Referring Blakelynn Scheeler: Treating  Latreshia Beauchaine/Extender: Rica Mote Weeks in Treatment: 24 Electronic Signature(s) Signed: 10/22/2022 5:16:05 PM By: Midge Aver MSN RN CNS WTA Entered By: Midge Aver on 10/21/2022 13:03:32 -------------------------------------------------------------------------------- Multi Wound Chart Details Patient Name: Date of Service: [name], [name] 10/21/2022 12:30 PM Medical Record Number: [id number] Patient Account Number: [id number] Date of Birth/Sex: Treating RN: 08/03/1960 (62 y.o. [name] Primary Care Elai Vanwyk: Enid Baas Other Clinician: Referring Lawarence Meek: Treating Treyvin Glidden/Extender: Rica Mote Weeks in Treatment: 24 Vital Signs Height(in): Pulse(bpm): 111 Weight(lbs): Blood Pressure(mmHg): 111/76 Body Mass Index(BMI): Temperature(F): 98.1 [name], [name] ([id number]) [phone].pdf Page 4 of 9 Respiratory Rate(breaths/min): 18 [3:Photos:] [N/A:N/A] Proximal, Midline Sacrum N/A N/A Wound Location: Pressure Injury N/A N/A Wounding Event: Pressure Ulcer N/A N/A Primary Etiology: Angina, Hypotension N/A N/A Comorbid History: 03/09/2022 N/A N/A Date Acquired: 24 N/A N/A Weeks of Treatment: Open N/A N/A Wound Status: No N/A N/A Wound Recurrence: 3.1x1.5x1.5 N/A N/A Measurements L x W x D (cm) 3.652 N/A N/A A (cm) : rea 5.478 N/A N/A Volume (cm) : 84.50% N/A N/A % Reduction in A rea: 96.10% N/A N/A % 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 N/A N/A Classification: Large N/A N/A Exudate A mount: Serosanguineous N/A N/A Exudate Type: red, brown N/A N/A Exudate Color: Epibole N/A N/A Wound Margin: Large (67-100%) N/A N/A Granulation A mount: Red, Hyper-granulation N/A N/A Granulation Quality: Small (1-33%) N/A N/A Necrotic A mount: Fat Layer (Subcutaneous Tissue): Yes N/A N/A Exposed Structures: Muscle: Yes Fascia: No Tendon: No Joint: No Bone:  No None N/A N/A Epithelialization: Treatment Notes Electronic Signature(s) Signed: 10/22/2022 5:16:05 PM By: Midge Aver MSN RN CNS WTA Entered By: Midge Aver on 10/21/2022 13:04:24 -------------------------------------------------------------------------------- Multi-Disciplinary Care Plan Details Patient Name: Date of Service: [name], [name] 10/21/2022 12:30 PM Medical Record Number: [id number] Patient Account Number: [id number] Date of Birth/Sex: Treating RN: [birth year] (62 y.o. [name] Primary  Care Janea Schwenn: Enid Baas Other Clinician: Referring Maddox Hlavaty: Treating Jaycub Noorani/Extender: Rica Mote Weeks in Treatment: [address] [place], [name] ([id number]) [id number] Page 5 of 9 Nursing Diagnoses: Impaired tissue integrity Knowledge deficit related to ulceration/compromised skin integrity Goals: Patient/caregiver will verbalize understanding of skin care regimen Date Initiated: 05/06/2022 Date Inactivated: 07/29/2022 Target Resolution Date: 08/28/2022 Goal Status: Met Ulcer/skin breakdown will have a volume reduction of 30% by week 4 Date Initiated: 05/06/2022 Date Inactivated: 07/29/2022 Target Resolution Date: 06/06/2022 Goal Status: Unmet Unmet Reason: not 30% decrease Ulcer/skin breakdown will have a volume reduction of 50% by week 8 Date Initiated: 05/20/2022 Date Inactivated: 07/29/2022 Target Resolution Date: 07/05/2022 Unmet Reason: not 50% decrease in Goal Status: Unmet size Ulcer/skin breakdown will have a volume reduction of 80% by week 12 Date Initiated: 05/20/2022 Date Inactivated: 07/29/2022 Target Resolution Date: 08/05/2022 Unmet Reason: not 80% decrease in Goal Status: Unmet size Ulcer/skin breakdown will heal within 14 weeks Date Initiated: 05/20/2022 Target Resolution Date: 11/11/2022 Goal Status: Active Interventions: Assess patient/caregiver  ability to perform ulcer/skin care regimen upon admission and as needed Assess ulceration(s) every visit Provide education on ulcer and skin care Screen for HBO Treatment Activities: Skin care regimen initiated : 05/06/2022 Topical wound management initiated : 05/06/2022 Notes: Negative Pressure Wound Therapy Nursing Diagnoses: Knowledge deficit related to use and safety of the device Impaired tissue integrity Goals: Patient/caregiver agrees to and verbalizes understanding of need to use Date Initiated: 09/14/2022 Date Inactivated: 10/07/2022 Target Resolution Date: 10/14/2022 Goal Status: Met Patient/caregiver will verbalize understanding of use of the device Date Initiated: 09/14/2022 Date Inactivated: 10/21/2022 Target Resolution Date: 10/14/2022 Goal Status: Met Promote granulation tissue Date Initiated: 09/14/2022 Target Resolution Date: 11/04/2022 Goal Status: Active Reduce inflammatory exudate Date Initiated: 09/14/2022 Target Resolution Date: 10/31/2022 Goal Status: Active Interventions: Assess drainage (amount and color) Assess patient nutrition upon admission and as needed per policy Assess patient understanding of disease process and management Assess patient/caregiver ability to obtain necessary supplies Assess wound bed for efficacy of treatment Monitor and protect skin around the wound Provide education on nutrition Provide education on use, care, and troubleshooting Treatment Activities: Incontinence or moisture care in place (i.e., Barrier creams, foley catheter, colostomy, diapers, etc.) : 09/02/2022 Patient referred to home health care : 09/02/2022 Referred to DME Verita Kuroda for dressing supplies : 09/02/2022 Support surface (Group 2/3) in use : 09/02/2022 Turning and Repositioning schedule in place : 09/02/2022 Notes: [name], [name] ([id number]) [phone].pdf Page 6 of 9 Electronic Signature(s) Signed: 10/22/2022 5:16:05 PM By: Midge Aver MSN  RN CNS WTA Entered By: Midge Aver on 10/21/2022 13:37:37 -------------------------------------------------------------------------------- Pain Assessment Details Patient Name: Date of Service: [name], [name] 10/21/2022 12:30 PM Medical Record Number: [id number] Patient Account Number: [id number] Date of Birth/Sex: Treating RN: March 06, 1961 (62 y.o. [name] Primary Care Deborha Moseley: Enid Baas Other Clinician: Referring Tanis Hensarling: Treating Elai Vanwyk/Extender: Rica Mote Weeks in Treatment: 24 Active Problems Location of Pain Severity and Description of Pain Patient Has Paino No Site Locations Pain Management and Medication Current Pain Management: Electronic Signature(s) Signed: 10/22/2022 5:16:05 PM By: Midge Aver MSN RN CNS WTA Entered By: Midge Aver on 10/21/2022 13:01:58 -------------------------------------------------------------------------------- Patient/Caregiver Education Details Patient Name: Date of Service: [name], [name] 7/10/2024andnbsp12:30 PM [name] ([id number]) [id number] Page 7 of 9 Medical Record Number: [id number] Patient Account Number: [id number] Date of Birth/Gender: Treating RN: 1961/02/19 (62 y.o. [name] Primary Care Physician: Enid Baas Other Clinician: Referring  Physician: Treating Physician/Extender: Rica Mote Weeks in Treatment: 24 Education Assessment Education Provided To: Patient Education Topics Provided Wound/Skin Impairment: Handouts: Caring for Your Ulcer Methods: Explain/Verbal Responses: State content correctly Electronic Signature(s) Signed: 10/22/2022 5:16:05 PM By: Midge Aver MSN RN CNS WTA Entered By: Midge Aver on 10/21/2022 13:37:42 -------------------------------------------------------------------------------- Wound Assessment Details Patient Name: Date of Service: [name], [name] 10/21/2022 12:30 PM Medical Record Number: [id number] Patient Account Number: [id number] Date of Birth/Sex: Treating RN: 02/04/1961 (62 y.o. [name] Primary Care Ritter Helsley: Enid Baas Other Clinician: Referring Laniqua Torrens: Treating Brentyn Seehafer/Extender: Rica Mote Weeks in Treatment: 24 Wound Status Wound Number: 3 Primary Etiology: Pressure Ulcer Wound Location: Proximal, Midline Sacrum Wound Status: Open Wounding Event: Pressure Injury Comorbid History: Angina, Hypotension Date Acquired: 03/09/2022 Weeks Of Treatment: 24 Clustered Wound: No Photos Wound Measurements Length: (cm) 3.1 Width: (cm) 1.5 Depth: (cm) 1.5 Area: (cm) 3.652 Volume: (cm) 5.478 [name], [name] ([id number]) % Reduction in Area: 84.5% % Reduction in Volume: 96.1% Epithelialization: None [id number] Page 8 of 9 Wound Description Classification: Category/Stage IV Wound Margin: Epibole Exudate Amount: Large Exudate Type: Serosanguineous Exudate Color: red, brown Foul Odor After Cleansing: No Slough/Fibrino Yes Wound Bed Granulation Amount: Large (67-100%) Exposed Structure Granulation Quality: Red, Hyper-granulation Fascia Exposed: No Necrotic Amount: Small (1-33%) Fat Layer (Subcutaneous Tissue) Exposed: Yes Tendon Exposed: No Muscle Exposed: Yes Necrosis of Muscle: No Joint Exposed: No Bone Exposed: No Treatment Notes Wound #3 (Sacrum) Wound Laterality: Midline, Proximal Cleanser Vashe 5.8 (oz) Discharge Instruction: Use vashe 5.8 (oz) as directed Peri-Wound Care Topical Primary Dressing Secondary Dressing Secured With Compression Wrap Compression Stockings Add-Ons Wound Vac Electronic Signature(s) Signed: 10/22/2022 5:16:05 PM By: Midge Aver MSN RN CNS WTA Entered By: Midge Aver on 10/21/2022 13:03:03 -------------------------------------------------------------------------------- Vitals  Details Patient Name: Date of Service: [name], [name] 10/21/2022 12:30 PM Medical Record Number: [id number] Patient Account Number: [id number] Date of Birth/Sex: Treating RN: 08/27/1960 (63 y.o. [name] Primary Care Desera Graffeo: Enid Baas Other Clinician: Referring Brendan Gruwell: Treating Indalecio Malmstrom/Extender: Rica Mote Weeks in Treatment: 24 Vital Signs Time Taken: 12:55 Temperature (F): 98.1 Pulse (bpm): 111 Respiratory Rate (breaths/min): 18 Blood Pressure (mmHg): 111/76 Reference Range: 80 - 120 mg / dl [name], [name] ([id number]) [phone].pdf Page 9 of 9 Electronic Signature(s) Signed: 10/22/2022 5:16:05 PM By: Midge Aver MSN RN CNS WTA Entered By: Midge Aver on 10/21/2022 13:01:49

## 2022-10-23 NOTE — Progress Notes (Signed)
[name], [name] ([id number]) [id number] Page 1 of 8 Visit Report for 10/21/2022 Chief Complaint Document Details Patient Name: Date of Service: [name] ND, MA [name] 10/21/2022 12:30 PM Medical Record Number: [id number] Patient Account Number: [id number] Date of Birth/Sex: Treating RN: [birth year] (62 y.o. [name] Primary Care Provider: Enid Baas Other Clinician: Referring Provider: Treating Provider/Extender: Rica Mote Weeks in Treatment: 24 Information Obtained from: Patient Chief Complaint 05/06/2022; Bilateral feet wounds and sacral wounds Electronic Signature(s) Signed: 10/21/2022 5:05:21 PM By: Geralyn Corwin DO Entered By: Geralyn Corwin on 10/21/2022 13:37:40 -------------------------------------------------------------------------------- HPI Details Patient Name: Date of Service: [name] ND, MA [name] 10/21/2022 12:30 PM Medical Record Number: [id number] Patient Account Number: [id number] Date of Birth/Sex: Treating RN: 1960/06/29 (62 y.o. [name] Primary Care Provider: Enid Baas Other Clinician: Referring Provider: Treating Provider/Extender: Rica Mote Weeks in Treatment: 24 History of Present Illness HPI Description: 05/06/2022 [name] is a 62 year old female with a past medical history of multiple sclerosis and PEs and DVT that presents to the clinic for sacral s wounds and Bilateral feet wounds. Patient states that she obtained the sacral ulcers when she was hospitalized in November 2023 for bilateral pulmonary embolisms. She was in the ICU on a ventilator. Since discharge she has been using Dakin's wet-to-dry dressings to the area. Since her hospitalization she has been bedbound. However she is doing physical therapy. Prior to being hospitalized she was able to pull herself up and transfer from bed to chair on her own. Due  to her MS she is not fully mobile. Unfortunately she states that the wound has gotten larger on her sacrum despite Wound care. Patient has home health. On 04/16/2022 she was admitted to the hospital for severe sepsis secondary to acute UTI and sacral cellulitis. She completed 5 days of IV cefepime and vancomycin. She was not discharged with oral antibiotics. She states she developed the bilateral feet wounds at that time. She states she has bunny boots however she is not wearing them. She currently denies systemic signs of infection. 2/7; patient presents for follow-up. She had a bone biopsy done at last clinic visit that showed fragments of inflamed granulation tissue, necrotic material and bone with acute osteomyelitis. Culture grew Proteus mirabilis and Klebsiella pneumoniae. Patient is scheduled to see infectious disease next week. For now she has been doing Dakin's wet-to-dry dressings to the sacrum. She is not able to obtain Santyl and has been keeping the left heel covered. T the right foot where o there was a previous wound with scab however this has healed. This has remained closed. No open wound noted. She states over the past week she has had chills but no fevers, nausea/vomiting or purulent drainage. 2/21; patient presents for follow-up. She saw Dr. Joylene Draft on 2/15. 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 Today she had feces KIMBERELY, ARNET ([id number]) [id number] Page 2 of 8 impacted in the wound and throughout the tunneling. We discussed a diverting colostomy to address this issue. She was agreeable with a referral to general surgery. She currently denies systemic signs of infection. She has been using Medihoney and Hydrofera Blue to the left heel wound. She has been using her Prevalon boots. 3/20; patient presents for follow-up. She has been using Dakin's wet-to-dry dressings to the sacral wound. She has developed a new wound to  her right lateral heel. She is using Medihoney and Hydrofera Blue to the left heel. She is  currently taking Levaquin to complete 6 weeks. She follows with infectious disease for this. She saw general surgery at [facility] to discuss potentially doing a diverting colostomy however per patient's surgeon did not recommend this. 4/17; patient presents for follow-up. Has been using Dakin's wet-to-dry dressings to the sacral wound. She has been using Medihoney and Hydrofera Blue to the heel wounds. She states she is using her bunny boots for offloading the heels. She has not heard from plastic surgery for consultation. We gave her the number to call to follow this up. 5/22; patient presents for follow-up. She saw Dr. Ferd Hibbs on 5/8, plastic surgery to discuss potentially doing surgical debridement with muscle flap. Per Dr. Timothy Lasso note Review of the surgery and requirements were discussed with the patient. Patient would like to hold off on proceeding with this option. Wound VAC was recommended and based on the wound today this option seemed reasonable to get started. Patient has home health that will be able to change the wound VAC. She has been using Dakin's wet-to-dry dressings to the sacrum. Patient has a new wound to the left medial ankle. She states she wears bunny boots but does not while in the wheelchair. 6/1; the patient has started the wound VAC as of Monday to the sacral area. Home health is out to change the dressing. She also has wounds on her bilateral ankles and left heel she is using Medihoney and Hydrofera Blue to these areas. She has advanced MS with contractures. Both the patient and her husband say she is eating a high-protein dilated well. She has some form of air mattress but the bed is from sleep number. I am uncertain about the offloading part of this 6/12; patient presents for follow-up. She has had a wound VAC to the sacral wound and home health has been changing this. She has had no  issues with this. She has been using Hydrofera Blue and Medihoney to the bilateral ankle and left heel wounds. The right ankle wound is healed. 6/26; patient presents for follow-up. Has been using a wound VAC to the sacral wound. She has no issues with this. Wound is smaller. She had ABIs on the right that were 1.27 and on the left that was 1.35 with a TBI of 1.18. She had triphasic waveforms throughout. She is been using Hydrofera Blue and Medihoney to the left heel wounds. These have closed. She denies signs of infection. 7/10; patient presents for follow-up. She continues to use a wound VAC to the sacral wound. She has slight skin irritation to the periwound from the drape. Overall wound appears healing. She has no other open wounds to her lower extremities. She currently denies systemic signs of infection. Electronic Signature(s) Signed: 10/21/2022 5:05:21 PM By: Geralyn Corwin DO Entered By: Geralyn Corwin on 10/21/2022 13:38:24 -------------------------------------------------------------------------------- Physical Exam Details Patient Name: Date of Service: [name] ND, MA [name] 10/21/2022 12:30 PM Medical Record Number: [id number] Patient Account Number: [id number] Date of Birth/Sex: Treating RN: 1960-05-15 (62 y.o. [name] Primary Care Provider: Enid Baas Other Clinician: Referring Provider: Treating Provider/Extender: Rica Mote Weeks in Treatment: 24 Constitutional . Psychiatric . Notes T the sacrum there is granulation tissue with no exposed bone. Undermining from 10-1, 26:00. No signs of surrounding soft tissue infection. Mild irritation to o the periwound. Electronic Signature(s) Signed: 10/21/2022 5:05:21 PM By: Geralyn Corwin DO Entered By: Geralyn Corwin on 10/21/2022 13:39:31 [name] ([id number]) [id number] Page 3 of  8 -------------------------------------------------------------------------------- Physician Orders Details  Patient Name: Date of Service: [name] ND, MA [name] 10/21/2022 12:30 PM Medical Record Number: [id number] Patient Account Number: [id number] Date of Birth/Sex: Treating RN: 20-Jul-1960 (62 y.o. [name] Primary Care Provider: Enid Baas Other Clinician: Referring Provider: Treating Provider/Extender: Rica Mote Weeks in Treatment: 24 Verbal / Phone Orders: No Diagnosis Coding ICD-10 Coding Code Description L89.154 Pressure ulcer of sacral region, stage 4 L89.620 Pressure ulcer of left heel, unstageable L89.510 Pressure ulcer of right ankle, unstageable L89.523 Pressure ulcer of left ankle, stage 3 I26.99 Other pulmonary embolism without acute cor pulmonale 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, sacral and sacrococcygeal region G35 Multiple sclerosis I82.409 Acute embolism and thrombosis of unspecified deep veins of unspecified lower extremity Z79.01 Long term (current) use of anticoagulants Home Health CONTINUE Home Health for wound care. May utilize formulary equivalent dressing for wound treatment orders unless otherwise specified. Home Health Nurse may visit PRN to address patients wound care needs. - ADORATION May apply Duoderm under the drape to help protect the skin **Please direct any NON-WOUND related issues/requests for orders to patient's Primary Care Physician. **If current dressing causes regression in wound condition, may D/C ordered dressing product/s and apply Normal Saline Moist Dressing daily until next Wound Healing Center or Other MD appointment. **Notify Wound Healing Center of regression in wound condition at [phone]. Negative Pressure Wound Therapy Wound #3 Proximal,Midline Sacrum Wound VAC settings at continuous pressure. Use foam to wound cavity. Please order WHITE foam to fill any tunnel/s  and/or undermining when necessary. Change VAC dressing 3 X WEEK. Change canister as indicated when full. - white foam in tunnels followed by black foam , Wound vac to be applied by home health nurse for Adoration, when patient is seen in clinic wound vac will be removed and replaced with wet to dry dressing , home health to reapply wound vac that same day . Home Health Nurse may d/c VAC for s/s of increased infection, significant wound regression, or uncontrolled drainage. Notify Wound Healing Center at [phone]. - ADORATION Wound Treatment Wound #3 - Sacrum Wound Laterality: Midline, Proximal Cleanser: Vashe 5.8 (oz) 3 x Per Week/30 Days Discharge Instructions: Use vashe 5.8 (oz) as directed Add-Ons: Wound Vac 3 x Per Week/30 Days Electronic Signature(s) Signed: 10/21/2022 5:05:21 PM By: Geralyn Corwin DO Entered By: Geralyn Corwin on 10/21/2022 13:43:12 [name] ([id number]) [id number] Page 4 of 8 -------------------------------------------------------------------------------- Problem List Details Patient Name: Date of Service: [name] ND, MA [name] 10/21/2022 12:30 PM Medical Record Number: [id number] Patient Account Number: [id number] Date of Birth/Sex: Treating RN: [birth year] (62 y.o. [name] Primary Care Provider: Enid Baas Other Clinician: Referring Provider: Treating Provider/Extender: Rica Mote Weeks in Treatment: 24 Active Problems ICD-10 Encounter Code Description Active Date MDM Diagnosis L89.154 Pressure ulcer of sacral region, stage 4 05/06/2022 No Yes I26.99 Other pulmonary embolism without acute cor pulmonale 05/06/2022 No Yes 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, sacral and sacrococcygeal region 05/20/2022 No Yes G35 Multiple sclerosis 05/06/2022 No Yes I82.409 Acute embolism and thrombosis of unspecified deep veins of unspecified 05/06/2022 No Yes lower extremity Z79.01 Long  term (current) use of anticoagulants 05/06/2022 No Yes Inactive Problems ICD-10 Code Description Active Date Inactive Date L89.153 Pressure ulcer of sacral region, stage 3 05/06/2022 05/06/2022 Resolved Problems ICD-10 Code Description Active Date Resolved Date L89.620 Pressure ulcer of left heel, unstageable 05/06/2022 05/06/2022 L89.510 Pressure ulcer of right ankle, unstageable 09/02/2022 09/02/2022 L89.523 Pressure ulcer of left ankle, stage 3 09/02/2022 09/02/2022  TYLIAH, DENHOLM ([id number]) [id number] Page 5 of 8 Electronic Signature(s) Signed: 10/21/2022 5:05:21 PM By: Geralyn Corwin DO Entered By: Geralyn Corwin on 10/21/2022 13:37:36 -------------------------------------------------------------------------------- Progress Note Details Patient Name: Date of Service: [name] ND, MA [name] 10/21/2022 12:30 PM Medical Record Number: [id number] Patient Account Number: [id number] Date of Birth/Sex: Treating RN: 04-Nov-1960 (62 y.o. [name] Primary Care Provider: Enid Baas Other Clinician: Referring Provider: Treating Provider/Extender: Rica Mote Weeks in Treatment: 24 Subjective Chief Complaint Information obtained from Patient 05/06/2022; Bilateral feet wounds and sacral wounds History of Present Illness (HPI) 05/06/2022 Ms. Birdine Handley is a 62 year old female with a past medical history of multiple sclerosis and PEs and DVT that presents to the clinic for sacral s wounds and Bilateral feet wounds. Patient states that she obtained the sacral ulcers when she was hospitalized in November 2023 for bilateral pulmonary embolisms. She was in the ICU on a ventilator. Since discharge she has been using Dakin's wet-to-dry dressings to the area. Since her hospitalization she has been bedbound. However she is doing physical therapy. Prior to being hospitalized she was able to pull herself up and transfer  from bed to chair on her own. Due to her MS she is not fully mobile. Unfortunately she states that the wound has gotten larger on her sacrum despite Wound care. Patient has home health. On 04/16/2022 she was admitted to the hospital for severe sepsis secondary to acute UTI and sacral cellulitis. She completed 5 days of IV cefepime and vancomycin. She was not discharged with oral antibiotics. She states she developed the bilateral feet wounds at that time. She states she has bunny boots however she is not wearing them. She currently denies systemic signs of infection. 2/7; patient presents for follow-up. She had a bone biopsy done at last clinic visit that showed fragments of inflamed granulation tissue, necrotic material and bone with acute osteomyelitis. Culture grew Proteus mirabilis and Klebsiella pneumoniae. Patient is scheduled to see infectious disease next week. For now she has been doing Dakin's wet-to-dry dressings to the sacrum. She is not able to obtain Santyl and has been keeping the left heel covered. T the right foot where o there was a previous wound with scab however this has healed. This has remained closed. No open wound noted. She states over the past week she has had chills but no fevers, nausea/vomiting or purulent drainage. 2/21; patient presents for follow-up. She saw Dr. Joylene Draft on 2/15. 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 Today she had feces impacted in the wound and throughout the tunneling. We discussed a diverting colostomy to address this issue. She was agreeable with a referral to general surgery. She currently denies systemic signs of infection. She has been using Medihoney and Hydrofera Blue to the left heel wound. She has been using her Prevalon boots. 3/20; patient presents for follow-up. She has been using Dakin's wet-to-dry dressings to the sacral wound. She has developed a new wound to her right lateral heel. She is using Medihoney and  Hydrofera Blue to the left heel. She is currently taking Levaquin to complete 6 weeks. She follows with infectious disease for this. She saw general surgery at [facility] to discuss potentially doing a diverting colostomy however per patient's surgeon did not recommend this. 4/17; patient presents for follow-up. Has been using Dakin's wet-to-dry dressings to the sacral wound. She has been using Medihoney and Hydrofera Blue to the heel wounds. She states she is using her  bunny boots for offloading the heels. She has not heard from plastic surgery for consultation. We gave her the number to call to follow this up. 5/22; patient presents for follow-up. She saw Dr. Ferd Hibbs on 5/8, plastic surgery to discuss potentially doing surgical debridement with muscle flap. Per Dr. Timothy Lasso note Review of the surgery and requirements were discussed with the patient. Patient would like to hold off on proceeding with this option. Wound VAC was recommended and based on the wound today this option seemed reasonable to get started. Patient has home health that will be able to change the wound VAC. She has been using Dakin's wet-to-dry dressings to the sacrum. Patient has a new wound to the left medial ankle. She states she wears bunny boots but does not while in the wheelchair. 6/1; the patient has started the wound VAC as of Monday to the sacral area. Home health is out to change the dressing. She also has wounds on her bilateral ankles and left heel she is using Medihoney and Hydrofera Blue to these areas. She has advanced MS with contractures. Both the patient and her husband say she is eating a high-protein dilated well. She has some form of air mattress but the bed is from sleep number. I am uncertain about the offloading part of this 6/12; patient presents for follow-up. She has had a wound VAC to the sacral wound and home health has been changing this. She has had no issues with this. She has been using Hydrofera Blue  and Medihoney to the bilateral ankle and left heel wounds. The right ankle wound is healed. 6/26; patient presents for follow-up. Has been using a wound VAC to the sacral wound. She has no issues with this. Wound is smaller. She had ABIs on the right that were 1.27 and on the left that was 1.35 with a TBI of 1.18. She had triphasic waveforms throughout. She is been using Hydrofera Blue and Medihoney to the left heel wounds. These have closed. She denies signs of infection. 7/10; patient presents for follow-up. She continues to use a wound VAC to the sacral wound. She has slight skin irritation to the periwound from the drape. Overall wound appears healing. She has no other open wounds to her lower extremities. She currently denies systemic signs of infection. [name], [name] ([id number]) [id number] Page 6 of 8 Objective Constitutional Vitals Time Taken: 12:55 PM, Temperature: 98.1 F, Pulse: 111 bpm, Respiratory Rate: 18 breaths/min, Blood Pressure: 111/76 mmHg. General Notes: T the sacrum there is granulation tissue with no exposed bone. Undermining from 10-1, 26:00. No signs of surrounding soft tissue infection. o Mild irritation to the periwound. Integumentary (Hair, Skin) Wound #3 status is Open. Original cause of wound was Pressure Injury. The date acquired was: 03/09/2022. The wound has been in treatment 24 weeks. The wound is located on the Proximal,Midline Sacrum. The wound measures 3.1cm length x 1.5cm width x 1.5cm depth; 3.652cm^2 area and 5.478cm^3 volume. There is muscle and Fat Layer (Subcutaneous Tissue) exposed. There is a large amount of serosanguineous drainage noted. The wound margin is epibole. There is large (67-100%) red, hyper - granulation within the wound bed. There is a small (1-33%) amount of necrotic tissue within the wound bed. Assessment Active Problems ICD-10 Pressure ulcer of sacral region, stage 4 Other pulmonary embolism  without acute cor pulmonale Osteomyelitis of vertebra, sacral and sacrococcygeal region Multiple sclerosis Acute embolism and thrombosis of unspecified deep veins of unspecified lower extremity Long term (current) use of anticoagulants  Patient's wound appears well-healing. I recommended continuing the wound VAC. T the periwound I recommended DuoDERM to help with skin irritation prior to o wound placement. Continue aggressive offloading. Continue aggressive offloading to the feet and lower extremities to prevent future wounds. She has bunny boots. Plan Home Health: [facility] for wound care. May utilize formulary equivalent dressing for wound treatment orders unless otherwise specified. Home Health Nurse may visit PRN to address patients wound care needs. - ADORATION May apply Duoderm under the drape to help protect the skin **Please direct any NON-WOUND related issues/requests for orders to patient's Primary Care Physician. **If current dressing causes regression in wound condition, may D/C ordered dressing product/s and apply Normal Saline Moist Dressing daily until next Wound Healing Center or Other MD appointment. **Notify Wound Healing Center of regression in wound condition at [phone]. Negative Pressure Wound Therapy: Wound #3 Proximal,Midline Sacrum: Wound VAC settings at continuous pressure. Use foam to wound cavity. Please order WHITE foam to fill any tunnel/s and/or undermining when necessary. Change VAC dressing 3 X WEEK. Change canister as indicated when full. - white foam in tunnels followed by black foam , Wound vac to be applied by home health nurse for Adoration, when patient is seen in clinic wound vac will be removed and replaced with wet to dry dressing , home health to reapply wound vac that same day . Home Health Nurse may d/c VAC for s/s of increased infection, significant wound regression, or uncontrolled drainage. Notify Wound Healing Center  at [phone]. - ADORATION WOUND #3: - Sacrum Wound Laterality: Midline, Proximal Cleanser: Vashe 5.8 (oz) 3 x Per Week/30 Days Discharge Instructions: Use vashe 5.8 (oz) as directed Add-Ons: Wound Vac 3 x Per Week/30 Days 1. Continue wound VAC 2. Aggressive offloading 3. Follow-up in 2 weeks Electronic Signature(s) Signed: 10/21/2022 5:05:21 PM By: Geralyn Corwin DO Entered By: Geralyn Corwin on 10/21/2022 13:42:39 [name] ([id number]) [id number] Page 7 of 8 -------------------------------------------------------------------------------- ROS/PFSH Details Patient Name: Date of Service: [name] ND, MA [name] 10/21/2022 12:30 PM Medical Record Number: [id number] Patient Account Number: [id number] Date of Birth/Sex: Treating RN: 10-May-1960 (62 y.o. [name] Primary Care Provider: Enid Baas Other Clinician: Referring Provider: Treating Provider/Extender: Rica Mote Weeks in Treatment: 24 Information Obtained From Patient Respiratory Medical History: Past Medical History Notes: pulmonary hemorrage Cardiovascular Medical History: Positive for: Angina; Hypotension Endocrine Medical History: Negative for: Type II Diabetes Integumentary (Skin) Medical History: Negative for: History of pressure wounds Neurologic Medical History: Past Medical History Notes: MS Immunizations Pneumococcal Vaccine: Received Pneumococcal Vaccination: No Implantable Devices None Family and Social History Never smoker; Marital Status - Widowed; Alcohol Use: Never; Caffeine Use: Daily Electronic Signature(s) Signed: 10/21/2022 5:05:21 PM By: Geralyn Corwin DO Signed: 10/22/2022 5:16:05 PM By: Midge Aver MSN RN CNS WTA Entered By: Geralyn Corwin on 10/21/2022 13:43:21 [name] ([id number]) [id number] Page 8 of  8 -------------------------------------------------------------------------------- SuperBill Details Patient Name: Date of Service: [name] ND, MA [name] 10/21/2022 Medical Record Number: [id number] Patient Account Number: [id number] Date of Birth/Sex: Treating RN: 1961-01-09 (62 y.o. [name] Primary Care Provider: Enid Baas Other Clinician: Referring Provider: Treating Provider/Extender: Rica Mote Weeks in Treatment: 24 Diagnosis Coding ICD-10 Codes Code Description [phone] Pressure ulcer of sacral region, stage 4 I26.99 Other pulmonary embolism without acute cor pulmonale 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, sacral and sacrococcygeal region G35 Multiple sclerosis I82.409 Acute embolism and thrombosis of unspecified deep veins of unspecified lower extremity  Z79.01 Long term (current) use of anticoagulants Facility Procedures : CPT4 Code: [id number] Description: 99213 - WOUND CARE VISIT-LEV 3 EST PT Modifier: Quantity: 1 Physician Procedures : CPT4 Code Description Modifier [id number] - WC PHYS LEVEL 3 - EST PT ICD-10 Diagnosis Description L89.154 Pressure ulcer of sacral region, 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, sacral and sacrococcygeal region G35 Multiple sclerosis Quantity: 1 Electronic Signature(s) Signed: 10/21/2022 5:05:21 PM By: Geralyn Corwin DO Entered By: Geralyn Corwin on 10/21/2022 13:43:06

## 2022-10-27 ENCOUNTER — Encounter: Payer: Self-pay | Admitting: Oncology

## 2022-10-27 ENCOUNTER — Inpatient Hospital Stay: Payer: Medicare Other | Attending: Oncology | Admitting: Oncology

## 2022-10-27 VITALS — BP 89/69 | HR 114 | Temp 96.0°F | Resp 18

## 2022-10-27 DIAGNOSIS — I2699 Other pulmonary embolism without acute cor pulmonale: Secondary | ICD-10-CM | POA: Diagnosis not present

## 2022-10-27 DIAGNOSIS — D72829 Elevated white blood cell count, unspecified: Secondary | ICD-10-CM | POA: Insufficient documentation

## 2022-10-27 DIAGNOSIS — L89154 Pressure ulcer of sacral region, stage 4: Secondary | ICD-10-CM | POA: Diagnosis not present

## 2022-10-27 DIAGNOSIS — Z86711 Personal history of pulmonary embolism: Secondary | ICD-10-CM | POA: Insufficient documentation

## 2022-10-27 DIAGNOSIS — Z79899 Other long term (current) drug therapy: Secondary | ICD-10-CM | POA: Diagnosis not present

## 2022-10-27 DIAGNOSIS — Z7901 Long term (current) use of anticoagulants: Secondary | ICD-10-CM | POA: Diagnosis not present

## 2022-10-27 DIAGNOSIS — D500 Iron deficiency anemia secondary to blood loss (chronic): Secondary | ICD-10-CM

## 2022-10-27 NOTE — Progress Notes (Signed)
Hematology/Oncology Progress note [phone] [phone]       CHIEF COMPLAINTS/PURPOSE OF CONSULTATION:  Leukocytosis  ASSESSMENT & PLAN:   Leukocytosis Chronic leukocytosis  Previous work up  JAK2 V617F mutation negative, with reflex to other mutations CALR, MPL, JAK 2 Ex 12-15 mutations negative. EBV negative.  CMV negative, BCR-ABL FISH negative.  Peripheral blood flow cytometry showed monocytosis 17% of the leukocytes, with immunophenotypic aberrancies-CD56 in a subset and down-regulation of CD13.  A nonspecific finding that can be seen in association with reactive/active processes as well as neoplastic processes.  No immature B cells detected in the specimen.  Bone marrow biopsy results were reviewed and discussed with patient. Hypercellular bone marrow for age with trilineage hematopoiesis, The overall myeloid findings are not considered entirely specific or diagnostic of a neoplastic process and may be secondary in nature due to infection, immune mediated process, medication, etc.  Nonetheless, correlation with cytogenetic studies is recommended. The bone marrow  also shows slight polyclonal plasmacytosis most consist with a reactive  process. -Cytogenetic is normal.  I discussed with pathology Dr.Smir regarding patient's case.  Her bone marrow findings are not diagnostic for neoplastic process.  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 Results reviewed and discussed with patient.  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  It is possible that she may have an early evolving process.  I recommend to continue to monitor her blood count.    Bilateral pulmonary embolism (HCC) Continue Xarelto   Decubitus ulcer of sacral region, stage 4 (HCC) Continue follow-up with Dr. Joylene Draft.   Orders Placed This Encounter  Procedures   CBC with Differential (Cancer Center Only)     Standing Status:   Future    Standing Expiration Date:   10/27/2023   CMP (Cancer Center only)    Standing Status:   Future    Standing Expiration Date:   10/27/2023   CMP (Cancer Center only)    Standing Status:   Future    Standing Expiration Date:   10/27/2023   CBC with Differential (Cancer Center Only)    Standing Status:   Future    Standing Expiration Date:   10/27/2023   Ferritin    Standing Status:   Future    Standing Expiration Date:   10/27/2023   Iron and TIBC    Standing Status:   Future    Standing Expiration Date:   10/27/2023   Retic Panel    Standing Status:   Future    Standing Expiration Date:   10/27/2023   Technologist smear review    Standing Status:   Future    Standing Expiration Date:   10/27/2023    Order Specific Question:   Clinical information:    Answer:   leukocytosis   Follow up 4 months All questions were answered. The patient knows to call the clinic with any problems, questions or concerns.  Rickard Patience, MD, PhD [facility] Hematology Oncology 10/27/2022    HISTORY OF PRESENTING ILLNESS:  [name] 62 y.o. female presents to for post hospitalization follow up for leukocytosis She is accompanied by her boyfriend.  +Stage IV  Decubitus ulcer,acute osteomyelitis patient follow-up with ID.  Wound is healing. Chronic fatigue unchanged. She is Xarelto for PE.      MEDICAL HISTORY:  Past Medical History:  Diagnosis Date   Bilateral pulmonary embolism (HCC)    Chronic constipation    Family  history of pancreatic cancer    Frequent falls    GERD (gastroesophageal reflux disease)    Hiatal hernia    Multiple sclerosis (HCC)    Pulmonary hemorrhage    RLS (restless legs syndrome)    Sepsis due to pneumonia (HCC)    Tachycardia     SURGICAL HISTORY: Past Surgical History:  Procedure Laterality Date   ESOPHAGOGASTRODUODENOSCOPY (EGD) WITH PROPOFOL N/A 09/11/2021   Procedure: ESOPHAGOGASTRODUODENOSCOPY (EGD) WITH PROPOFOL;   Surgeon: Jaynie Collins, DO;  Location: [facility] ENDOSCOPY;  Service: Gastroenterology;  Laterality: N/A;   PULMONARY THROMBECTOMY Bilateral 02/25/2022   Procedure: PULMONARY THROMBECTOMY;  Surgeon: Annice Needy, MD;  Location: [facility] INVASIVE CV LAB;  Service: Cardiovascular;  Laterality: Bilateral;   TUBAL LIGATION      SOCIAL HISTORY: Social History   Socioeconomic History   Marital status: Widowed    Spouse name: Not on file   Number of children: Not on file   Years of education: Not on file   Highest education level: Not on file  Occupational History   Not on file  Tobacco Use   Smoking status: Never   Smokeless tobacco: Never  Vaping Use   Vaping status: Never Used  Substance and Sexual Activity   Alcohol use: Never   Drug use: Never   Sexual activity: Not Currently  Other Topics Concern   Not on file  Social History Narrative   Not on file   Social Determinants of Health   Financial Resource Strain: Not on file  Food Insecurity: No Food Insecurity (04/17/2022)   Hunger Vital Sign    Worried About Running Out of Food in the Last Year: Never true    Ran Out of Food in the Last Year: Never true  Transportation Needs: No Transportation Needs (04/17/2022)   PRAPARE - Administrator, Civil Service (Medical): No    Lack of Transportation (Non-Medical): No  Physical Activity: Not on file  Stress: Not on file  Social Connections: Not on file  Intimate Partner Violence: Not At Risk (04/17/2022)   Humiliation, Afraid, Rape, and Kick questionnaire    Fear of Current or Ex-Partner: No    Emotionally Abused: No    Physically Abused: No    Sexually Abused: No    FAMILY HISTORY: History reviewed. No pertinent family history.  ALLERGIES:  is allergic to erythromycin, hydrocodone-acetaminophen, and lexapro [escitalopram oxalate].  MEDICATIONS:  Current Outpatient Medications  Medication Sig Dispense Refill   ascorbic acid (VITAMIN C) 500 MG tablet Take 500 mg  by mouth daily.     benzonatate (TESSALON) 200 MG capsule Take by mouth.     Cholecalciferol 50 MCG (2000 UT) CAPS Take 2,000 Units by mouth daily.     clindamycin (CLEOCIN) 300 MG capsule Take by mouth.     clindamycin (CLEOCIN) 300 MG capsule Take 300 mg by mouth 3 (three) times daily.     collagenase (SANTYL) 250 UNIT/GM ointment Apply 1 Application topically daily.     cyanocobalamin 1000 MCG tablet Place 1 tablet (1,000 mcg total) into feeding tube daily.     cyclobenzaprine (FLEXERIL) 10 MG tablet Take 10 mg by mouth 2 (two) times daily as needed.     dalfampridine 10 MG TB12 Take 1 tablet by mouth every 12 (twelve) hours.     DULoxetine (CYMBALTA) 30 MG capsule Take 30 mg by mouth daily.     gabapentin (NEURONTIN) 800 MG tablet Take 400-800 mg by mouth 2 (two) times daily.  imipramine (TOFRANIL) 25 MG tablet Take 25 mg by mouth at bedtime.     levofloxacin (LEVAQUIN) 750 MG tablet Take 1 tablet (750 mg total) by mouth daily. 30 tablet 1   midodrine (PROAMATINE) 5 MG tablet Take 1 tablet (5 mg total) by mouth 2 (two) times daily with a meal. 14 tablet 0   omeprazole (PRILOSEC) 20 MG capsule Take 20 mg by mouth daily.     pregabalin (LYRICA) 75 MG capsule Take 75 mg by mouth 3 (three) times daily.     propranolol (INDERAL) 20 MG tablet Take 20 mg by mouth 2 (two) times daily.     rivaroxaban (XARELTO) 20 MG TABS tablet Take 1 tablet (20 mg total) by mouth daily with supper. 30 tablet 0   rOPINIRole (REQUIP) 0.5 MG tablet Take 0.5 mg by mouth at bedtime.     tolterodine (DETROL LA) 2 MG 24 hr capsule Take 2 mg by mouth daily.     traMADol (ULTRAM) 50 MG tablet Take 50 mg by mouth 2 (two) times daily as needed.     traZODone (DESYREL) 50 MG tablet Take 50 mg by mouth at bedtime.     No current facility-administered medications for this visit.    Review of Systems  Constitutional:  Positive for appetite change and fatigue. Negative for chills and fever.  HENT:   Negative for hearing  loss and voice change.   Eyes:  Negative for eye problems.  Respiratory:  Negative for chest tightness, cough and shortness of breath.   Cardiovascular:  Negative for chest pain.  Gastrointestinal:  Negative for abdominal distention, abdominal pain and blood in stool.  Endocrine: Negative for hot flashes.  Genitourinary:  Negative for difficulty urinating and frequency.   Musculoskeletal:  Negative for arthralgias and back pain.  Skin:  Negative for itching and rash.       Sacrum ulcer  Neurological:  Negative for extremity weakness.  Hematological:  Negative for adenopathy.  Psychiatric/Behavioral:  Negative for confusion.      PHYSICAL EXAMINATION: ECOG PERFORMANCE STATUS: 2 - Symptomatic, <50% confined to bed  Vitals:   10/27/22 1058  BP: (!) 89/69  Pulse: (!) 114  Resp: 18  Temp: (!) 96 F (35.6 C)  SpO2: 97%   There were no vitals filed for this visit.  Physical Exam Constitutional:      General: She is not in acute distress.    Comments: Patient sits in wheelchair  HENT:     Head: Normocephalic and atraumatic.  Eyes:     General: No scleral icterus. Cardiovascular:     Rate and Rhythm: Normal rate.  Pulmonary:     Effort: Pulmonary effort is normal. No respiratory distress.  Abdominal:     General: There is no distension.     Palpations: Abdomen is soft.  Musculoskeletal:     Cervical back: Normal range of motion and neck supple.  Skin:    Coloration: Skin is pale.  Neurological:     Mental Status: She is alert and oriented to person, place, and time. Mental status is at baseline.     Motor: No abnormal muscle tone.  Psychiatric:        Mood and Affect: Affect normal.      LABORATORY DATA:  I have reviewed the data as listed    Latest Ref Rng & Units 07/03/2022   11:45 AM 06/17/2022    9:17 AM 06/05/2022   10:56 AM  CBC  WBC 4.0 - 10.5  K/uL 12.3  17.3  17.4   Hemoglobin 12.0 - 15.0 g/dL 8.3  9.2  7.3   Hematocrit 36.0 - 46.0 % 28.6  31.8  24.6    Platelets 150 - 400 K/uL 426  493  393       Latest Ref Rng & Units 05/28/2022   12:00 PM 04/21/2022    6:18 AM 04/20/2022    5:07 AM  CMP  Glucose 70 - 99 mg/dL 478   99   BUN 6 - 20 mg/dL 11   8   Creatinine 2.95 - 1.00 mg/dL 6.21  3.08  6.57   Sodium 135 - 145 mmol/L 130   137   Potassium 3.5 - 5.1 mmol/L 3.5  4.4  3.4   Chloride 98 - 111 mmol/L 95   104   CO2 22 - 32 mmol/L 23   24   Calcium 8.9 - 10.3 mg/dL 8.5   8.2   Total Protein 6.5 - 8.1 g/dL 5.8     Total Bilirubin 0.3 - 1.2 mg/dL 0.5     Alkaline Phos 38 - 126 U/L 298     AST 15 - 41 U/L 32     ALT 0 - 44 U/L 17        RADIOGRAPHIC STUDIES: I have personally reviewed the radiological images as listed and agreed with the findings in the report. VAS [place] ABI WITH/WO TBI  Result Date: 10/01/2022  LOWER EXTREMITY DOPPLER STUDY Patient Name:  REBEL LAUGHRIDGE  Date of Exam:   09/30/2022 Medical Rec #: [id number]              Accession #:    [id number] Date of Birth: July 29, 1960              Patient Gender: F Patient Age:   89 years Exam Location:  [facility] Procedure:      VAS [place] ABI WITH/WO TBI Referring Phys: Sheppard Plumber --------------------------------------------------------------------------------  Indications: Ulceration, and peripheral artery disease. Other Factors: Raynaud's (per patient).  Limitations: Today's exam was limited due to patient in wheelchair. Performing Technologist: Hardie Lora RVT  Examination Guidelines: A complete evaluation includes at minimum, Doppler waveform signals and systolic blood pressure reading at the level of bilateral brachial, anterior tibial, and posterior tibial arteries, when vessel segments are accessible. Bilateral testing is considered an integral part of a complete examination. Photoelectric Plethysmograph (PPG) waveforms and toe systolic pressure readings are included as required and additional duplex testing as needed. Limited examinations for reoccurring indications  may be performed as noted.  ABI Findings: +---------+------------------+-----+---------+--------+ Right    Rt Pressure (mmHg)IndexWaveform Comment  +---------+------------------+-----+---------+--------+ Brachial 125                                      +---------+------------------+-----+---------+--------+ PTA      159               1.27 triphasic         +---------+------------------+-----+---------+--------+ DP       141               1.13 triphasic         +---------+------------------+-----+---------+--------+ Great Toe151               1.21                   +---------+------------------+-----+---------+--------+ +---------+------------------+-----+--------+-------+ Left  Lt Pressure (mmHg)IndexWaveformComment +---------+------------------+-----+--------+-------+ Brachial 121                                    +---------+------------------+-----+--------+-------+ PTA      148               1.18                 +---------+------------------+-----+--------+-------+ DP       169               1.35                 +---------+------------------+-----+--------+-------+ Great Toe148               1.18                 +---------+------------------+-----+--------+-------+ +-------+-----------+-----------+------------+------------+ ABI/TBIToday's ABIToday's TBIPrevious ABIPrevious TBI +-------+-----------+-----------+------------+------------+ Right  1.27       1.21                                +-------+-----------+-----------+------------+------------+ Left   1.35       1.18                                +-------+-----------+-----------+------------+------------+  Arterial wall calcification precludes accurate ankle pressures and ABIs.  Summary: Right: Resting right ankle-brachial index is within normal range. The right toe-brachial index is normal. Left: Resting left ankle-brachial index indicates noncompressible left lower extremity  arteries. The left toe-brachial index is normal. *See table(s) above for measurements and observations.  Electronically signed by Levora Dredge MD on 10/01/2022 at 3:24:55 PM.    Final

## 2022-10-27 NOTE — Assessment & Plan Note (Signed)
Continue follow-up with Dr. Steva Ready.

## 2022-10-27 NOTE — Assessment & Plan Note (Signed)
Continue Xarelto 

## 2022-10-27 NOTE — Assessment & Plan Note (Addendum)
Chronic leukocytosis  Previous work up  JAK2 V617F mutation negative, with reflex to other mutations CALR, MPL, JAK 2 Ex 12-15 mutations negative. EBV negative.  CMV negative, BCR-ABL FISH negative.  Peripheral blood flow cytometry showed monocytosis 17% of the leukocytes, with immunophenotypic aberrancies-CD56 in a subset and down-regulation of CD13.  A nonspecific finding that can be seen in association with reactive/active processes as well as neoplastic processes.  No immature B cells detected in the specimen.  Bone marrow biopsy results were reviewed and discussed with patient. Hypercellular bone marrow for age with trilineage hematopoiesis, The overall myeloid findings are not considered entirely specific or diagnostic of a neoplastic process and may be secondary in nature due to infection, immune mediated process, medication, etc.  Nonetheless, correlation with cytogenetic studies is recommended. The bone marrow  also shows slight polyclonal plasmacytosis most consist with a reactive  process. -Cytogenetic is normal.  I discussed with pathology Dr.Smir regarding patient's case.  Her bone marrow findings are not diagnostic for neoplastic process.  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 Results reviewed and discussed with patient.  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  It is possible that she may have an early evolving process.  I recommend to continue to monitor her blood count.

## 2022-11-02 NOTE — Progress Notes (Signed)
[name] ([id number]) [id number] Page 1 of 7 Visit Report for 09/16/2022 HPI Details Patient Name: Date of Service: [name] STRA ND, [name] 09/16/2022 1:15 PM Medical Record Number: [id number] Patient Account Number: [id number] Date of Birth/Sex: Treating RN: Aug 27, 1960 (62 y.o. [name] Primary Care Provider: Enid Baas Other Clinician: Referring Provider: Treating Provider/Extender: RO BSO N, MICHA EL Verdis Prime, Radhika Weeks in Treatment: 19 History of Present Illness HPI Description: 05/06/2022 [name] is a 62 year old female with a past medical history of multiple sclerosis and PEs and DVT that presents to the clinic for sacral s wounds and Bilateral feet wounds. Patient states that she obtained the sacral ulcers when she was hospitalized in November 2023 for bilateral pulmonary embolisms. She was in the ICU on a ventilator. Since discharge she has been using Dakin's wet-to-dry dressings to the area. Since her hospitalization she has been bedbound. However she is doing physical therapy. Prior to being hospitalized she was able to pull herself up and transfer from bed to chair on her own. Due to her MS she is not fully mobile. Unfortunately she states that the wound has gotten larger on her sacrum despite Wound care. Patient has home health. On 04/16/2022 she was admitted to the hospital for severe sepsis secondary to acute UTI and sacral cellulitis. She completed 5 days of IV cefepime and vancomycin. She was not discharged with oral antibiotics. She states she developed the bilateral feet wounds at that time. She states she has bunny boots however she is not wearing them. She currently denies systemic signs of infection. 2/7; patient presents for follow-up. She had a bone biopsy done at last clinic visit that showed fragments of inflamed granulation tissue, necrotic material and bone with acute osteomyelitis.  Culture grew Proteus mirabilis and Klebsiella pneumoniae. Patient is scheduled to see infectious disease next week. For now she has been doing Dakin's wet-to-dry dressings to the sacrum. She is not able to obtain Santyl and has been keeping the left heel covered. T the right foot where o there was a previous wound with scab however this has healed. This has remained closed. No open wound noted. She states over the past week she has had chills but no fevers, nausea/vomiting or purulent drainage. 2/21; patient presents for follow-up. She saw Dr. Joylene Draft on 2/15. 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 Today she had feces impacted in the wound and throughout the tunneling. We discussed a diverting colostomy to address this issue. She was agreeable with a referral to general surgery. She currently denies systemic signs of infection. She has been using Medihoney and Hydrofera Blue to the left heel wound. She has been using her Prevalon boots. 3/20; patient presents for follow-up. She has been using Dakin's wet-to-dry dressings to the sacral wound. She has developed a new wound to her right lateral heel. She is using Medihoney and Hydrofera Blue to the left heel. She is currently taking Levaquin to complete 6 weeks. She follows with infectious disease for this. She saw general surgery at [facility] to discuss potentially doing a diverting colostomy however per patient's surgeon did not recommend this. 4/17; patient presents for follow-up. Has been using Dakin's wet-to-dry dressings to the sacral wound. She has been using Medihoney and Hydrofera Blue to the heel wounds. She states she is using her bunny boots for offloading the heels. She has not heard from plastic surgery for consultation. We gave her the number to call to follow this  up. 5/22; patient presents for follow-up. She saw Dr. Ferd Hibbs on 5/8, plastic surgery to discuss potentially doing surgical debridement with muscle flap.  Per Dr. Timothy Lasso note Review of the surgery and requirements were discussed with the patient. Patient would like to hold off on proceeding with this option. Wound VAC was recommended and based on the wound today this option seemed reasonable to get started. Patient has home health that will be able to change the wound VAC. She has been using Dakin's wet-to-dry dressings to the sacrum. Patient has a new wound to the left medial ankle. She states she wears bunny boots but does not while in the wheelchair. 6/1; the patient has started the wound VAC as of Monday to the sacral area. Home health is out to change the dressing. She also has wounds on her bilateral ankles and left heel she is using Medihoney and Hydrofera Blue to these areas. She has advanced MS with contractures. Both the patient and her husband say she is eating a high-protein dilated well. She has some form of air mattress but the bed is from sleep number. I am uncertain about the offloading part of this Electronic Signature(s) Signed: 09/16/2022 5:06:19 PM By: Baltazar Najjar MD Entered By: Baltazar Najjar on 09/16/2022 14:52:15 [name] ([id number]) [id number] Page 2 of 7 -------------------------------------------------------------------------------- Physical Exam Details Patient Name: Date of Service: [name] ND, [name] 09/16/2022 1:15 PM Medical Record Number: [id number] Patient Account Number: [id number] Date of Birth/Sex: Treating RN: 04-02-1961 (62 y.o. [name] Primary Care Provider: Enid Baas Other Clinician: Referring Provider: Treating Provider/Extender: RO BSO N, MICHA EL Verdis Prime, Radhika Weeks in Treatment: 19 Constitutional Sitting or standing Blood Pressure is within target range for patient.. Pulse regular and within target range for patient.Marland Kitchen Respirations regular, non-labored and within target range.. Temperature is normal and within the target  range for the patient.. Eyes No scleral icterus. Pupils equal, symmetric, and react to light.. Musculoskeletal Severe bilateral flexion contractures. Notes Wound exam sacrum. There is still exposed bone in this area and a small degree. Significant undermining from 1-6 o'clock. No evidence of surrounding soft tissue infection She has wounds on the left heel right and left ankle. These look as though they are improving. No debridement was required in any area. Electronic Signature(s) Signed: 09/16/2022 5:06:19 PM By: Baltazar Najjar MD Entered By: Baltazar Najjar on 09/16/2022 14:54:50 -------------------------------------------------------------------------------- Physician Orders Details Patient Name: Date of Service: [name] ND, [name] 09/16/2022 1:15 PM Medical Record Number: [id number] Patient Account Number: [id number] Date of Birth/Sex: Treating RN: July 29, 1960 (62 y.o. [name] Primary Care Provider: Enid Baas Other Clinician: Referring Provider: Treating Provider/Extender: RO BSO N, MICHA EL Verdis Prime, Radhika Weeks in Treatment: 48 Verbal / Phone Orders: No Diagnosis Coding Home Health CONTINUE Home Health for wound care. May utilize formulary equivalent dressing for wound treatment orders unless otherwise specified. Home Health Nurse may visit PRN to address patients wound care needs. - ADORATION **Please direct any NON-WOUND related issues/requests for orders to patient's Primary Care Physician. **If current dressing causes regression in wound condition, may D/C ordered dressing product/s and apply Normal Saline Moist Dressing daily until next Wound Healing Center or Other MD appointment. **Notify Wound Healing Center of regression in wound condition at [phone]. Negative Pressure Wound Therapy Wound #3 Lorann, Tani ([id number]) [id number] Page 3 of 7 Wound VAC settings at  continuous pressure. Use foam to wound cavity. Please order WHITE foam to  fill any tunnel/s and/or undermining when necessary. Change VAC dressing 3 X WEEK. Change canister as indicated when full. - white foam in tunnels followed by black foam , Wound vac to be applied by home health nurse for Adoration, when patient is seen in clinic wound vac will be removed and replaced with wet to dry dressing , home health to reapply wound vac that same day . Home Health Nurse may d/c VAC for s/s of increased infection, significant wound regression, or uncontrolled drainage. Notify Wound Healing Center at [phone]. - ADORATION Wound Treatment Wound #2 - Calcaneus Wound Laterality: Left Cleanser: Soap and Water 3 x Per Week/30 Days Discharge Instructions: Gently cleanse wound with antibacterial soap, rinse and pat dry prior to dressing wounds Topical: Santyl Collagenase Ointment, 30 (gm), tube 3 x Per Week/30 Days Discharge Instructions: apply nickel thick to wound bed only Prim Dressing: Hydrofera Blue Ready Transfer Foam, 2.5x2.5 (in/in) 3 x Per Week/30 Days ary Discharge Instructions: Apply Hydrofera Blue Ready to wound bed as directed Prim Dressing: Zetuvit Plus 4x4 (in/in) ary 3 x Per Week/30 Days Wound #3 - Sacrum Wound Laterality: Midline, Proximal Cleanser: Vashe 5.8 (oz) 3 x Per Week/30 Days Discharge Instructions: Use vashe 5.8 (oz) as directed Add-Ons: Wound Vac 3 x Per Week/30 Days Wound #5 - Ankle Wound Laterality: Right, Lateral Cleanser: Soap and Water 3 x Per Week/30 Days Discharge Instructions: Gently cleanse wound with antibacterial soap, rinse and pat dry prior to dressing wounds Topical: Activon Honey Gel, 25 (g) Tube 3 x Per Week/30 Days Prim Dressing: Hydrofera Blue Ready Transfer Foam, 2.5x2.5 (in/in) 3 x Per Week/30 Days ary Discharge Instructions: Apply Hydrofera Blue Ready to wound bed as directed Prim Dressing: Zetuvit Plus 4x4 (in/in) ary 3 x Per Week/30 Days Wound #6  - Ankle Wound Laterality: Left, Medial Cleanser: Soap and Water 3 x Per Week/30 Days Discharge Instructions: Gently cleanse wound with antibacterial soap, rinse and pat dry prior to dressing wounds Topical: Activon Honey Gel, 25 (g) Tube 3 x Per Week/30 Days Prim Dressing: Hydrofera Blue Ready Transfer Foam, 2.5x2.5 (in/in) 3 x Per Week/30 Days ary Discharge Instructions: Apply Hydrofera Blue Ready to wound bed as directed Prim Dressing: Zetuvit Plus 4x4 (in/in) ary 3 x Per Week/30 Days Electronic Signature(s) Signed: 09/16/2022 5:26:05 PM By: Midge Aver MSN RN CNS WTA Signed: 09/17/2022 12:27:53 PM By: Baltazar Najjar MD Previous Signature: 09/16/2022 5:06:19 PM Version By: Baltazar Najjar MD Entered By: Midge Aver on 09/16/2022 17:19:38 -------------------------------------------------------------------------------- Problem List Details Patient Name: Date of Service: [name] ND, [name] 09/16/2022 1:15 PM Medical Record Number: [id number] Patient Account Number: [id number] Date of Birth/Sex: Treating RN: Jan 10, 1961 (63 y.o. [name] Primary Care Provider: Enid Baas Other Clinician: [name] ([id number]) [id number] Page 4 of 7 Referring Provider: Treating Provider/Extender: RO BSO N, MICHA EL Verdis Prime, Radhika Weeks in Treatment: 19 Active Problems ICD-10 Encounter Code Description Active Date MDM Diagnosis L89.154 Pressure ulcer of sacral region, stage 4 05/06/2022 No Yes L89.620 Pressure ulcer of left heel, unstageable 05/06/2022 No Yes L89.510 Pressure ulcer of right ankle, unstageable 09/02/2022 No Yes L89.523 Pressure ulcer of left ankle, stage 3 09/02/2022 No Yes I26.99 Other pulmonary embolism without acute cor pulmonale 05/06/2022 No Yes 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, sacral and sacrococcygeal region 05/20/2022 No Yes G35 Multiple sclerosis 05/06/2022 No Yes I82.409 Acute embolism and thrombosis of unspecified  deep veins of unspecified 05/06/2022 No Yes lower extremity Z79.01 Long term (current) use of anticoagulants 05/06/2022 No  Yes Inactive Problems ICD-10 Code Description Active Date Inactive Date L89.153 Pressure ulcer of sacral region, stage 3 05/06/2022 05/06/2022 Resolved Problems Electronic Signature(s) Signed: 09/16/2022 5:06:19 PM By: Baltazar Najjar MD Entered By: Baltazar Najjar on 09/16/2022 14:50:49 [name] ([id number]) [id number] Page 5 of 7 -------------------------------------------------------------------------------- Progress Note Details Patient Name: Date of Service: [name] ND, [name] 09/16/2022 1:15 PM Medical Record Number: [id number] Patient Account Number: [id number] Date of Birth/Sex: Treating RN: 1961/02/03 (62 y.o. [name] Primary Care Provider: Enid Baas Other Clinician: Referring Provider: Treating Provider/Extender: RO BSO N, MICHA EL Verdis Prime, Radhika Weeks in Treatment: 19 Subjective History of Present Illness (HPI) 05/06/2022 Ms. Tanaysha Alkins is a 62 year old female with a past medical history of multiple sclerosis and PEs and DVT that presents to the clinic for sacral s wounds and Bilateral feet wounds. Patient states that she obtained the sacral ulcers when she was hospitalized in November 2023 for bilateral pulmonary embolisms. She was in the ICU on a ventilator. Since discharge she has been using Dakin's wet-to-dry dressings to the area. Since her hospitalization she has been bedbound. However she is doing physical therapy. Prior to being hospitalized she was able to pull herself up and transfer from bed to chair on her own. Due to her MS she is not fully mobile. Unfortunately she states that the wound has gotten larger on her sacrum despite Wound care. Patient has home health. On 04/16/2022 she was admitted to the hospital for severe sepsis secondary to acute UTI and sacral  cellulitis. She completed 5 days of IV cefepime and vancomycin. She was not discharged with oral antibiotics. She states she developed the bilateral feet wounds at that time. She states she has bunny boots however she is not wearing them. She currently denies systemic signs of infection. 2/7; patient presents for follow-up. She had a bone biopsy done at last clinic visit that showed fragments of inflamed granulation tissue, necrotic material and bone with acute osteomyelitis. Culture grew Proteus mirabilis and Klebsiella pneumoniae. Patient is scheduled to see infectious disease next week. For now she has been doing Dakin's wet-to-dry dressings to the sacrum. She is not able to obtain Santyl and has been keeping the left heel covered. T the right foot where o there was a previous wound with scab however this has healed. This has remained closed. No open wound noted. She states over the past week she has had chills but no fevers, nausea/vomiting or purulent drainage. 2/21; patient presents for follow-up. She saw Dr. Joylene Draft on 2/15. 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 Today she had feces impacted in the wound and throughout the tunneling. We discussed a diverting colostomy to address this issue. She was agreeable with a referral to general surgery. She currently denies systemic signs of infection. She has been using Medihoney and Hydrofera Blue to the left heel wound. She has been using her Prevalon boots. 3/20; patient presents for follow-up. She has been using Dakin's wet-to-dry dressings to the sacral wound. She has developed a new wound to her right lateral heel. She is using Medihoney and Hydrofera Blue to the left heel. She is currently taking Levaquin to complete 6 weeks. She follows with infectious disease for this. She saw general surgery at [facility] to discuss potentially doing a diverting colostomy however per patient's surgeon did not recommend this. 4/17; patient  presents for follow-up. Has been using Dakin's wet-to-dry dressings to the sacral wound. She has been using Medihoney  and Hydrofera Blue to the heel wounds. She states she is using her bunny boots for offloading the heels. She has not heard from plastic surgery for consultation. We gave her the number to call to follow this up. 5/22; patient presents for follow-up. She saw Dr. Ferd Hibbs on 5/8, plastic surgery to discuss potentially doing surgical debridement with muscle flap. Per Dr. Timothy Lasso note Review of the surgery and requirements were discussed with the patient. Patient would like to hold off on proceeding with this option. Wound VAC was recommended and based on the wound today this option seemed reasonable to get started. Patient has home health that will be able to change the wound VAC. She has been using Dakin's wet-to-dry dressings to the sacrum. Patient has a new wound to the left medial ankle. She states she wears bunny boots but does not while in the wheelchair. 6/1; the patient has started the wound VAC as of Monday to the sacral area. Home health is out to change the dressing. She also has wounds on her bilateral ankles and left heel she is using Medihoney and Hydrofera Blue to these areas. She has advanced MS with contractures. Both the patient and her husband say she is eating a high-protein dilated well. She has some form of air mattress but the bed is from sleep number. I am uncertain about the offloading part of this Objective Constitutional Sitting or standing Blood Pressure is within target range for patient.. Pulse regular and within target range for patient.Marland Kitchen Respirations regular, non-labored and within target range.. Temperature is normal and within the target range for the patient.. Vitals Time Taken: 1:48 PM, Temperature: 97.6 F, Pulse: 97 bpm, Respiratory Rate: 18 breaths/min, Blood Pressure: 104/74 mmHg. Eyes No scleral icterus. Pupils equal, symmetric, and react to  light.. Musculoskeletal Severe bilateral flexion contractures. General Notes: Wound exam sacrum. There is still exposed bone in this area and a small degree. Significant undermining from 1-6 o'clock. No evidence of surrounding soft tissue infection She has wounds on the left heel right and left ankle. These look as though they are improving. No debridement was required in any area. Integumentary (Hair, Skin) Wound #2 status is Open. Original cause of wound was Pressure Injury. The date acquired was: 03/12/2022. The wound has been in treatment 19 weeks. The wound is located on the Left Calcaneus. The wound measures 0.4cm length x 1cm width x 0.2cm depth; 0.314cm^2 area and 0.063cm^3 volume. There is Fat Layer (Subcutaneous Tissue) exposed. There is a small amount of serous drainage noted. There is no granulation within the wound bed. There is a large (67- 100%) amount of necrotic tissue within the wound bed including Eschar and Adherent Slough. ABAIGEAL, MOOMAW ([id number]) [id number] Page 6 of 7 Wound #3 status is Open. Original cause of wound was Pressure Injury. The date acquired was: 03/09/2022. The wound has been in treatment 19 weeks. The wound is located on the Proximal,Midline Sacrum. The wound measures 2.6cm length x 2.74cm width x 1.6cm depth; 5.595cm^2 area and 8.952cm^3 volume. There is muscle and Fat Layer (Subcutaneous Tissue) exposed. There is no tunneling noted, however, there is undermining starting at 12:00 and ending at 12:00 with a maximum distance of 4.7cm. There is a large amount of serosanguineous drainage noted. The wound margin is epibole. There is large (67-100%) red, hyper - granulation within the wound bed. There is a small (1-33%) amount of necrotic tissue within the wound bed. Wound #5 status is Open. Original cause of wound was Gradually Appeared.  The date acquired was: 06/22/2022. The wound has been in treatment 11 weeks. The wound is  located on the Right,Lateral Ankle. The wound measures 0.7cm length x 1cm width x 0.1cm depth; 0.55cm^2 area and 0.055cm^3 volume. There is Fat Layer (Subcutaneous Tissue) exposed. There is a none present amount of drainage noted. There is no granulation within the wound bed. There is a large (67- 100%) amount of necrotic tissue within the wound bed including Eschar. Wound #6 status is Open. Original cause of wound was Pressure Injury. The date acquired was: 08/12/2022. The wound has been in treatment 2 weeks. The wound is located on the Left,Medial Ankle. The wound measures 0.3cm length x 0.3cm width x 0.1cm depth; 0.071cm^2 area and 0.007cm^3 volume. There is Fat Layer (Subcutaneous Tissue) exposed. There is a medium amount of serosanguineous drainage noted. There is medium (34-66%) red granulation within the wound bed. There is a medium (34-66%) amount of necrotic tissue within the wound bed including Adherent Slough. Assessment Active Problems ICD-10 Pressure ulcer of sacral region, stage 4 Pressure ulcer of left heel, unstageable Pressure ulcer of right ankle, unstageable Pressure ulcer of left ankle, stage 3 Other pulmonary embolism without acute cor pulmonale Osteomyelitis of vertebra, sacral and sacrococcygeal region Multiple sclerosis Acute embolism and thrombosis of unspecified deep veins of unspecified lower extremity Long term (current) use of anticoagulants Plan Home Health: CONTINUE Home Health for wound care. May utilize formulary equivalent dressing for wound treatment orders unless otherwise specified. Home Health Nurse may visit PRN to address patients wound care needs. - ADORATION **Please direct any NON-WOUND related issues/requests for orders to patient's Primary Care Physician. **If current dressing causes regression in wound condition, may D/C ordered dressing product/s and apply Normal Saline Moist Dressing daily until next Wound Healing Center or Other MD  appointment. **Notify Wound Healing Center of regression in wound condition at [phone]. Negative Pressure Wound Therapy: Wound #3 Proximal,Midline Sacrum: Wound VAC settings at continuous pressure. Use foam to wound cavity. Please order WHITE foam to fill any tunnel/s and/or undermining when necessary. Change VAC dressing 3 X WEEK. Change canister as indicated when full. - white foam in tunnels followed by black foam , Wound vac to be applied by home health nurse for Adoration, when patient is seen in clinic wound vac will be removed and replaced with wet to dry dressing , home health to reapply wound vac that same day . Home Health Nurse may d/c VAC for s/s of increased infection, significant wound regression, or uncontrolled drainage. Notify Wound Healing Center at [phone]. - ADORATION WOUND #2: - Calcaneus Wound Laterality: Left Cleanser: Soap and Water 3 x Per Week/30 Days Discharge Instructions: Gently cleanse wound with antibacterial soap, rinse and pat dry prior to dressing wounds Topical: Santyl Collagenase Ointment, 30 (gm), tube 3 x Per Week/30 Days Discharge Instructions: apply nickel thick to wound bed only Prim Dressing: Hydrofera Blue Ready Transfer Foam, 2.5x2.5 (in/in) 3 x Per Week/30 Days ary Discharge Instructions: Apply Hydrofera Blue Ready to wound bed as directed Prim Dressing: Zetuvit Plus 4x4 (in/in) 3 x Per Week/30 Days ary WOUND #3: - Sacrum Wound Laterality: Midline, Proximal Cleanser: Vashe 5.8 (oz) 3 x Per Week/30 Days Discharge Instructions: Use vashe 5.8 (oz) as directed WOUND #5: - Ankle Wound Laterality: Right, Lateral Cleanser: Soap and Water 3 x Per Week/30 Days Discharge Instructions: Gently cleanse wound with antibacterial soap, rinse and pat dry prior to dressing wounds Topical: Activon Honey Gel, 25 (g) Tube 3 x Per  Week/30 Days Prim Dressing: Hydrofera Blue Ready Transfer Foam, 2.5x2.5 (in/in) 3 x Per Week/30 Days ary Discharge  Instructions: Apply Hydrofera Blue Ready to wound bed as directed Prim Dressing: Zetuvit Plus 4x4 (in/in) 3 x Per Week/30 Days ary WOUND #6: - Ankle Wound Laterality: Left, Medial Cleanser: Soap and Water 3 x Per Week/30 Days Discharge Instructions: Gently cleanse wound with antibacterial soap, rinse and pat dry prior to dressing wounds Topical: Activon Honey Gel, 25 (g) Tube 3 x Per Week/30 Days Prim Dressing: Hydrofera Blue Ready Transfer Foam, 2.5x2.5 (in/in) 3 x Per Week/30 Days ary Discharge Instructions: Apply Hydrofera Blue Ready to wound bed as directed Prim Dressing: Zetuvit Plus 4x4 (in/in) 3 x Per Week/30 Days ary 1. Continue the wound VAC. Might benefit from some collagen under the foam I did not order this today 2. Continue the Medihoney and Hydrofera Blue on the lower extremity wounds which appear to be improving 3. She would probably qualify for hospice little bed with a level 3 surface. KABRIA, HETZER ([id number]) [id number] Page 7 of 7 Electronic Signature(s) Signed: 09/16/2022 5:06:19 PM By: Baltazar Najjar MD Entered By: Baltazar Najjar on 09/16/2022 14:56:49 -------------------------------------------------------------------------------- SuperBill Details Patient Name: Date of Service: [name] ND, [name] 09/16/2022 Medical Record Number: [id number] Patient Account Number: [id number] Date of Birth/Sex: Treating RN: 1960/07/10 (62 y.o. 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, sacral and sacrococcygeal region G35 Multiple sclerosis I82.409 Acute embolism and thrombosis of unspecified deep veins of unspecified lower extremity Z79.01 Long term (current) use of anticoagulants Facility Procedures : CPT4 Code: [id number] Description: 99214 - WOUND CARE VISIT-LEV 4 EST PT Modifier: Quantity: 1 Physician Procedures : CPT4 Code Description Modifier [id number] 99214 - WC PHYS LEVEL 4 - EST PT ICD-10 Diagnosis Description L89.154 Pressure ulcer of sacral region, stage 4 L89.620 Pressure ulcer of left heel, unstageable L89.510 Pressure ulcer of right ankle, unstageable  L89.523 Pressure ulcer of left ankle, stage 3 Quantity: 1 Electronic Signature(s) Signed: 09/16/2022 5:21:23 PM By: Midge Aver MSN RN CNS WTA Signed: 09/17/2022 12:27:53 PM By: Baltazar Najjar MD Previous Signature: 09/16/2022 5:06:19 PM Version By: Baltazar Najjar MD Entered By: Midge Aver on 09/16/2022 17:21:22

## 2022-11-02 NOTE — Progress Notes (Signed)
FLORRIE, RAMIRES ([id number]) [id number] Page 1 of 8 Visit Report for 09/02/2022 Chief Complaint Document Details Patient Name: Date of Service: [name] Glen [name], [name] 09/02/2022 12:30 PM Medical Record Number: [id number] Patient Account Number: [id number] Date of Birth/Sex: Treating RN: 19-May-1960 (62 y.o. [name] Primary Care Provider: Enid Baas Other Clinician: Referring Provider: Treating Provider/Extender: Rica Mote Weeks in Treatment: 17 Information Obtained from: Patient Chief Complaint 05/06/2022; Bilateral feet wounds and sacral wounds Electronic Signature(s) Signed: 09/02/2022 4:06:15 PM By: Geralyn Corwin DO Entered By: Geralyn Corwin on 09/02/2022 14:05:21 -------------------------------------------------------------------------------- HPI Details Patient Name: Date of Service: [name] Glen [name], [name] 09/02/2022 12:30 PM Medical Record Number: [id number] Patient Account Number: [id number] Date of Birth/Sex: Treating RN: [birth year] (62 y.o. [name] Primary Care Provider: Enid Baas Other Clinician: Referring Provider: Treating Provider/Extender: Rica Mote Weeks in Treatment: 17 History of Present Illness HPI Description: 05/06/2022 [name] is a 62 year old female with a past medical history of multiple sclerosis and PEs and DVT that presents to the clinic for sacral s wounds and Bilateral feet wounds. Patient states that she obtained the sacral ulcers when she was hospitalized in November 2023 for bilateral pulmonary embolisms. She was in the ICU on a ventilator. Since discharge she has been using Dakin's wet-to-dry dressings to the area. Since her hospitalization she has been bedbound. However she is doing physical therapy. Prior to being hospitalized she was able to pull herself up and transfer from bed to chair on her own. Due  to her MS she is not fully mobile. Unfortunately she states that the wound has gotten larger on her sacrum despite Wound care. Patient has home health. On 04/16/2022 she was admitted to the hospital for severe sepsis secondary to acute UTI and sacral cellulitis. She completed 5 days of IV cefepime and vancomycin. She was not discharged with oral antibiotics. She states she developed the bilateral feet wounds at that time. She states she has bunny boots however she is not wearing them. She currently denies systemic signs of infection. 2/7; patient presents for follow-up. She had a bone biopsy done at last clinic visit that showed fragments of inflamed granulation tissue, necrotic material and bone with acute osteomyelitis. Culture grew Proteus mirabilis and Klebsiella pneumoniae. Patient is scheduled to see infectious disease next week. For now she has been doing Dakin's wet-to-dry dressings to the sacrum. She is not able to obtain Santyl and has been keeping the left heel covered. T the right foot where o there was a previous wound with scab however this has healed. This has remained closed. No open wound noted. She states over the past week she has had chills but no fevers, nausea/vomiting or purulent drainage. 2/21; patient presents for follow-up. She saw Dr. Joylene Draft on 2/15. 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 Today she had feces [name], [name] ([id number]) [id number] Page 2 of 8 impacted in the wound and throughout the tunneling. We discussed a diverting colostomy to address this issue. She was agreeable with a referral to general surgery. She currently denies systemic signs of infection. She has been using Medihoney and Hydrofera Blue to the left heel wound. She has been using her Prevalon boots. 3/20; patient presents for follow-up. She has been using Dakin's wet-to-dry dressings to the sacral wound. She has developed a new wound to  her right lateral heel. She is using Medihoney and Hydrofera Blue to the left heel. She is  currently taking Levaquin to complete 6 weeks. She follows with infectious disease for this. She saw general surgery at [facility] to discuss potentially doing a diverting colostomy however per patient's surgeon did not recommend this. 4/17; patient presents for follow-up. Has been using Dakin's wet-to-dry dressings to the sacral wound. She has been using Medihoney and Hydrofera Blue to the heel wounds. She states she is using her bunny boots for offloading the heels. She has not heard from plastic surgery for consultation. We gave her the number to call to follow this up. 5/22; patient presents for follow-up. She saw Dr. Ferd Hibbs on 5/8, plastic surgery to discuss potentially doing surgical debridement with muscle flap. Per Dr. Timothy Lasso note Review of the surgery and requirements were discussed with the patient. Patient would like to hold off on proceeding with this option. Wound VAC was recommended and based on the wound today this option seemed reasonable to get started. Patient has home health that will be able to change the wound VAC. She has been using Dakin's wet-to-dry dressings to the sacrum. Patient has a new wound to the left medial ankle. She states she wears bunny boots but does not while in the wheelchair. Electronic Signature(s) Signed: 09/02/2022 4:06:15 PM By: Geralyn Corwin DO Entered By: Geralyn Corwin on 09/02/2022 14:05:27 -------------------------------------------------------------------------------- Physical Exam Details Patient Name: Date of Service: [name] Glen [name], [name] 09/02/2022 12:30 PM Medical Record Number: [id number] Patient Account Number: [id number] Date of Birth/Sex: Treating RN: Feb 25, 1961 (62 y.o. [name] Primary Care Provider: Enid Baas Other Clinician: Referring Provider: Treating Provider/Extender: Rica Mote Weeks in  Treatment: 17 Constitutional . Cardiovascular . Psychiatric . Notes T the left heel there is an open wound with granulation tissue and scant nonviable tissue. o T the right lateral ankle there is is an open wound with granulation tissue o New wound to the medial left ankle with nonviable tissue Sacrum: large wound that tunnels and undermines with granulation tissue, Although not the healthiest in appearance. Palpates very close to bone. Electronic Signature(s) Signed: 09/02/2022 4:06:15 PM By: Geralyn Corwin DO Entered By: Geralyn Corwin on 09/02/2022 14:05:39 [name] ([id number]) [id number] Page 3 of 8 -------------------------------------------------------------------------------- Physician Orders Details Patient Name: Date of Service: [name] Glen [name], [name] 09/02/2022 12:30 PM Medical Record Number: [id number] Patient Account Number: [id number] Date of Birth/Sex: Treating RN: [birth year] (62 y.o. [name] Primary Care Provider: Enid Baas Other Clinician: Referring Provider: Treating Provider/Extender: Rica Mote Weeks in Treatment: 26 Verbal / Phone Orders: No Diagnosis Coding ICD-10 Coding Code Description L89.154 Pressure ulcer of sacral region, stage 4 L89.620 Pressure ulcer of left heel, unstageable L89.510 Pressure ulcer of right ankle, unstageable L89.523 Pressure ulcer of left ankle, stage 3 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, sacral and sacrococcygeal region G35 Multiple sclerosis I26.99 Other pulmonary embolism without acute cor pulmonale I82.409 Acute embolism and thrombosis of unspecified deep veins of unspecified lower extremity Z79.01 Long term (current) use of anticoagulants Follow-up Appointments Return Appointment in 2 weeks. Home Health Home Health Company: - [facility] [phone]. Please visit at least two times a week for dressing changes and notify us if any  changes. BE SURE TO GENTLY PACK 5 o'clock TUNNEL TO SACRAL ULCER Requesting approval for wound vac for the sacral wound. Continue Dakins for now. CONTINUE Home Health for wound care. May utilize formulary equivalent dressing for wound treatment orders unless otherwise specified. Home Health Nurse may visit PRN to address patients wound care needs. -  2 x week. We will see patient on Wednesdays. Scheduled days for dressing changes to be completed; exception, patient has scheduled wound care visit that day. **Please direct any NON-WOUND related issues/requests for orders to patient's Primary Care Physician. **If current dressing causes regression in wound condition, may D/C ordered dressing product/s and apply Normal Saline Moist Dressing daily until next Wound Healing Center or Other MD appointment. **Notify Wound Healing Center of regression in wound condition at [phone]. Bathing/ Shower/ Hygiene May shower; gently cleanse wound with antibacterial soap, rinse and pat dry prior to dressing wounds No tub bath. Anesthetic (Use 'Patient Medications' Section for Anesthetic Order Entry) Lidocaine applied to wound bed Off-Loading Roho cushion for wheelchair Hospital bed/mattress A fluidized (Group 3) ir Turn and reposition every 2 hours Other: - Wear offloading boots at all times, no pressure on heels. Float heels on pillow. Additional Orders / Instructions Follow Nutritious Diet and Increase Protein Intake - Increase protein, supplement with Ensure, Boost, etc. Negative Pressure Wound Therapy Wound #3 Proximal,Midline Sacrum Other: - Requesting approval for wound vac to the sacral wound. Continue the Dakins for now. Wound Treatment Wound #2 - Calcaneus Wound Laterality: Left Cleanser: Soap and Water (Home Health) 1 x Per Day/30 Days Discharge Instructions: Gently cleanse wound with antibacterial soap, rinse and pat dry prior to dressing wounds Cleanser: Wound Cleanser (Home Health) 1 x  Per Day/30 Days Discharge Instructions: Wash your hands with soap and water. Remove old dressing, discard into plastic bag and place into trash. Cleanse the wound with Wound Cleanser prior to applying a clean dressing using gauze sponges, not tissues or cotton balls. Do not scrub or use excessive force. Pat dry using gauze sponges, not tissue or cotton balls. Topical: Activon Honey Gel, 25 (g) Tube 1 x Per Day/30 Days Prim Dressing: Hydrofera Blue Ready Transfer Foam, 2.5x2.5 (in/in) 1 x Per Day/30 Days ary Discharge Instructions: Apply Hydrofera Blue Ready to wound bed as directed Prim Dressing: (BORDER) Zetuvit Plus Silicone Border Dressing 4x4 (in/in) ary 1 x Per Day/30 Days JESLY, HARTMANN ([id number]) [phone].pdf Page 4 of 8 Wound #3 - Sacrum Wound Laterality: Midline, Proximal Cleanser: Dakin 16 (oz) 0.25 1 x Per Day/30 Days Discharge Instructions: Use as directed. Cleanser: Soap and Water (Home Health) 1 x Per Day/30 Days Discharge Instructions: Gently cleanse wound with antibacterial soap, rinse and pat dry prior to dressing wounds Secondary Dressing: Kerlix 4.5 x 4.1 (in/yd) 1 x Per Day/30 Days Discharge Instructions: lightly packed into wound , moistened with dakins 1/4 solution Secured With: Bordered foam dressing. (Home Health) (Generic) 1 x Per Day/30 Days Wound #5 - Ankle Wound Laterality: Right, Lateral Cleanser: Soap and Water (Home Health) 1 x Per Day/30 Days Discharge Instructions: Gently cleanse wound with antibacterial soap, rinse and pat dry prior to dressing wounds Cleanser: Wound Cleanser (Home Health) 1 x Per Day/30 Days Discharge Instructions: Wash your hands with soap and water. Remove old dressing, discard into plastic bag and place into trash. Cleanse the wound with Wound Cleanser prior to applying a clean dressing using gauze sponges, not tissues or cotton balls. Do not scrub or use excessive force. Pat dry using gauze sponges,  not tissue or cotton balls. Topical: Activon Honey Gel, 25 (g) Tube 1 x Per Day/30 Days Prim Dressing: Hydrofera Blue Ready Transfer Foam, 2.5x2.5 (in/in) 1 x Per Day/30 Days ary Discharge Instructions: Apply Hydrofera Blue Ready to wound bed as directed Prim Dressing: (BORDER) Zetuvit Plus Silicone Border Dressing 4x4 (in/in) ary 1 x Per  Day/30 Days Wound #6 - Ankle Wound Laterality: Left, Medial Cleanser: Soap and Water (Home Health) 1 x Per Day/30 Days Discharge Instructions: Gently cleanse wound with antibacterial soap, rinse and pat dry prior to dressing wounds Cleanser: Wound Cleanser (Home Health) 1 x Per Day/30 Days Discharge Instructions: Wash your hands with soap and water. Remove old dressing, discard into plastic bag and place into trash. Cleanse the wound with Wound Cleanser prior to applying a clean dressing using gauze sponges, not tissues or cotton balls. Do not scrub or use excessive force. Pat dry using gauze sponges, not tissue or cotton balls. Topical: Activon Honey Gel, 25 (g) Tube 1 x Per Day/30 Days Prim Dressing: Hydrofera Blue Ready Transfer Foam, 2.5x2.5 (in/in) 1 x Per Day/30 Days ary Discharge Instructions: Apply Hydrofera Blue Ready to wound bed as directed Prim Dressing: (BORDER) Zetuvit Plus Silicone Border Dressing 4x4 (in/in) ary 1 x Per Day/30 Days Electronic Signature(s) Signed: 09/02/2022 5:07:31 PM By: Midge Aver MSN RN CNS WTA Signed: 09/03/2022 9:43:21 AM By: Geralyn Corwin DO Previous Signature: 09/02/2022 4:38:43 PM Version By: Geralyn Corwin DO Entered By: Midge Aver on 09/02/2022 16:47:46 -------------------------------------------------------------------------------- Problem List Details Patient Name: Date of Service: [name] Glen [name], [name] 09/02/2022 12:30 PM Medical Record Number: [id number] Patient Account Number: [id number] Date of Birth/Sex: Treating RN: 1960-09-21 (62 y.o. [name] Primary Care Provider: Enid Baas Other Clinician: Referring Provider: Treating Provider/Extender: Rica Mote Weeks in Treatment: [address] [place] ([id number]) [id number] Page 5 of 8 Active Problems ICD-10 Encounter Code Description Active Date MDM Diagnosis L89.154 Pressure ulcer of sacral region, stage 4 05/06/2022 No Yes L89.620 Pressure ulcer of left heel, unstageable 05/06/2022 No Yes L89.510 Pressure ulcer of right ankle, unstageable 09/02/2022 No Yes L89.523 Pressure ulcer of left ankle, stage 3 09/02/2022 No Yes 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, sacral and sacrococcygeal region 05/20/2022 No Yes G35 Multiple sclerosis 05/06/2022 No Yes I26.99 Other pulmonary embolism without acute cor pulmonale 05/06/2022 No Yes I82.409 Acute embolism and thrombosis of unspecified deep veins of unspecified 05/06/2022 No Yes lower extremity Z79.01 Long term (current) use of anticoagulants 05/06/2022 No Yes Inactive Problems ICD-10 Code Description Active Date Inactive Date L89.153 Pressure ulcer of sacral region, stage 3 05/06/2022 05/06/2022 Resolved Problems Electronic Signature(s) Signed: 09/02/2022 4:48:27 PM By: Midge Aver MSN RN CNS WTA Signed: 09/03/2022 9:43:21 AM By: Geralyn Corwin DO Previous Signature: 09/02/2022 4:38:43 PM Version By: Geralyn Corwin DO Previous Signature: 09/02/2022 4:06:15 PM Version By: Geralyn Corwin DO Entered By: Midge Aver on 09/02/2022 16:48:27 Progress Note Details -------------------------------------------------------------------------------- [name] ([id number]) [id number] Page 6 of 8 Patient Name: Date of Service: [name] Glen [name], [name] 09/02/2022 12:30 PM Medical Record Number: [id number] Patient Account Number: [id number] Date of Birth/Sex: Treating RN: 1961-02-06 (62 y.o. [name] Primary Care Provider: Enid Baas Other Clinician: Referring  Provider: Treating Provider/Extender: Rica Mote Weeks in Treatment: 17 Subjective Chief Complaint Information obtained from Patient 05/06/2022; Bilateral feet wounds and sacral wounds History of Present Illness (HPI) 05/06/2022 Ms. Neyah Ellerman is a 62 year old female with a past medical history of multiple sclerosis and PEs and DVT that presents to the clinic for sacral s wounds and Bilateral feet wounds. Patient states that she obtained the sacral ulcers when she was hospitalized in November 2023 for bilateral pulmonary embolisms. She was in the ICU on a ventilator. Since discharge she has been using Dakin's wet-to-dry dressings to the area. Since her hospitalization she has been  bedbound. However she is doing physical therapy. Prior to being hospitalized she was able to pull herself up and transfer from bed to chair on her own. Due to her MS she is not fully mobile. Unfortunately she states that the wound has gotten larger on her sacrum despite Wound care. Patient has home health. On 04/16/2022 she was admitted to the hospital for severe sepsis secondary to acute UTI and sacral cellulitis. She completed 5 days of IV cefepime and vancomycin. She was not discharged with oral antibiotics. She states she developed the bilateral feet wounds at that time. She states she has bunny boots however she is not wearing them. She currently denies systemic signs of infection. 2/7; patient presents for follow-up. She had a bone biopsy done at last clinic visit that showed fragments of inflamed granulation tissue, necrotic material and bone with acute osteomyelitis. Culture grew Proteus mirabilis and Klebsiella pneumoniae. Patient is scheduled to see infectious disease next week. For now she has been doing Dakin's wet-to-dry dressings to the sacrum. She is not able to obtain Santyl and has been keeping the left heel covered. T the right foot where o there was a previous wound  with scab however this has healed. This has remained closed. No open wound noted. She states over the past week she has had chills but no fevers, nausea/vomiting or purulent drainage. 2/21; patient presents for follow-up. She saw Dr. Joylene Draft on 2/15. 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 Today she had feces impacted in the wound and throughout the tunneling. We discussed a diverting colostomy to address this issue. She was agreeable with a referral to general surgery. She currently denies systemic signs of infection. She has been using Medihoney and Hydrofera Blue to the left heel wound. She has been using her Prevalon boots. 3/20; patient presents for follow-up. She has been using Dakin's wet-to-dry dressings to the sacral wound. She has developed a new wound to her right lateral heel. She is using Medihoney and Hydrofera Blue to the left heel. She is currently taking Levaquin to complete 6 weeks. She follows with infectious disease for this. She saw general surgery at [facility] to discuss potentially doing a diverting colostomy however per patient's surgeon did not recommend this. 4/17; patient presents for follow-up. Has been using Dakin's wet-to-dry dressings to the sacral wound. She has been using Medihoney and Hydrofera Blue to the heel wounds. She states she is using her bunny boots for offloading the heels. She has not heard from plastic surgery for consultation. We gave her the number to call to follow this up. 5/22; patient presents for follow-up. She saw Dr. Ferd Hibbs on 5/8, plastic surgery to discuss potentially doing surgical debridement with muscle flap. Per Dr. Timothy Lasso note Review of the surgery and requirements were discussed with the patient. Patient would like to hold off on proceeding with this option. Wound VAC was recommended and based on the wound today this option seemed reasonable to get started. Patient has home health that will be able to change  the wound VAC. She has been using Dakin's wet-to-dry dressings to the sacrum. Patient has a new wound to the left medial ankle. She states she wears bunny boots but does not while in the wheelchair. Objective Constitutional Vitals Time Taken: 1:00 PM, Temperature: 98.2 F, Pulse: 116 bpm, Respiratory Rate: 18 breaths/min, Blood Pressure: 98/54 mmHg. General Notes: T the left heel there is an open wound with granulation tissue and scant nonviable tissue. T the right  lateral ankle there is is an open wound o o with granulation tissue New wound to the medial left ankle with nonviable tissue Sacrum: large wound that tunnels and undermines with granulation tissue, Although not the healthiest in appearance. Palpates very close to bone. Integumentary (Hair, Skin) Wound #2 status is Open. Original cause of wound was Pressure Injury. The date acquired was: 03/12/2022. The wound has been in treatment 17 weeks. The wound is located on the Left Calcaneus. The wound measures 0.7cm length x 2cm width x 0.2cm depth; 1.1cm^2 area and 0.22cm^3 volume. There is Fat Layer (Subcutaneous Tissue) exposed. There is a small amount of serous drainage noted. There is no granulation within the wound bed. There is a large (67- 100%) amount of necrotic tissue within the wound bed including Eschar and Adherent Slough. Wound #3 status is Open. Original cause of wound was Pressure Injury. The date acquired was: 03/09/2022. The wound has been in treatment 17 weeks. The wound is located on the Proximal,Midline Sacrum. The wound measures 4.3cm length x 2.3cm width x 3cm depth; 7.768cm^2 area and 23.303cm^3 volume. There is muscle and Fat Layer (Subcutaneous Tissue) exposed. There is undermining starting at 1:00 and ending at 4:00 with a maximum distance of 5cm. There is a large amount of serosanguineous drainage noted. The wound margin is epibole. There is large (67-100%) red, hyper - granulation within the wound bed. There is a  small (1-33%) amount of necrotic tissue within the wound bed. Wound #5 status is Open. Original cause of wound was Gradually Appeared. The date acquired was: 06/22/2022. The wound has been in treatment 9 weeks. The wound is located on the Right,Lateral Ankle. The wound measures 1.1cm length x 1cm width x 0.2cm depth; 0.864cm^2 area and 0.173cm^3 volume. There is Fat Layer (Subcutaneous Tissue) exposed. There is a none present amount of drainage noted. There is no granulation within the wound bed. There is a large (67-100%) amount of necrotic tissue within the wound bed including Eschar. Wound #6 status is Open. Original cause of wound was Pressure Injury. The date acquired was: 08/12/2022. The wound is located on the Left,Medial Ankle. The wound measures 0.7cm length x 0.7cm width x 0.3cm depth; 0.385cm^2 area and 0.115cm^3 volume. There is Fat Layer (Subcutaneous Tissue) exposed. There is no tunneling or undermining noted. There is a medium amount of serosanguineous drainage noted. There is medium (34-66%) red granulation within the wound Alma, [name] ([id number]) [id number] Page 7 of 8 bed. There is a medium (34-66%) amount of necrotic tissue within the wound bed including Adherent Slough. Assessment Active Problems ICD-10 Pressure ulcer of sacral region, stage 4 Pressure ulcer of left heel, unstageable Pressure ulcer of right ankle, unstageable Pressure ulcer of left ankle, stage 3 Osteomyelitis of vertebra, sacral and sacrococcygeal region Multiple sclerosis Other pulmonary embolism without acute cor pulmonale Acute embolism and thrombosis of unspecified deep veins of unspecified lower extremity Long term (current) use of anticoagulants Overall patient's wounds have improved in size and appearance since last clinic visit. Unfortunately she has developed a new wound to the left medial ankle. I recommended the importance of aggressive offloading to heal  these wounds and also to help prevent future wounds. She needs to wear her bunny boots when not ambulating and in the wheelchair and at night. She discussed potentially doing a muscle flap with Dr. Ferd Hibbs but at this time patient does not want to proceed with this. We will try the wound VAC to the sacrum. Currently moisture incontinence is being  managed by family by keeping the dressing clean and changing this daily and as needed. I recommended Medihoney and Hydrofera Blue to the feet wounds. Follow-up in 2 weeks. Plan 1. Dakin's wet-to-dry dressings until wound VAC is started 2. Order wound VAC 3. Hydrofera Blue and Medihoney 4. Aggressive offloadingbunny boots 5. Follow-up in 2 weeks Electronic Signature(s) Signed: 09/21/2022 2:31:02 PM By: Geralyn Corwin DO Previous Signature: 09/02/2022 4:06:15 PM Version By: Geralyn Corwin DO Entered By: Geralyn Corwin on 09/21/2022 14:30:28 -------------------------------------------------------------------------------- SuperBill Details Patient Name: Date of Service: [name] Glen [name], [name] 09/02/2022 Medical Record Number: [id number] Patient Account Number: [id number] Date of Birth/Sex: Treating RN: 09/06/1960 (62 y.o. 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, sacral and sacrococcygeal region BRITTANNI, CARIKER ([id number]) [phone].pdf Page 8 of 8 G35 Multiple sclerosis I26.99 Other pulmonary embolism without acute cor pulmonale I82.409 Acute embolism and thrombosis of unspecified deep veins of  unspecified lower extremity Z79.01 Long term (current) use of anticoagulants Facility Procedures : CPT4 Code: [id number] Description: 51884 - WOUND CARE VISIT-LEV 5 EST PT Modifier: Quantity: 1 Physician Procedures : CPT4 Code Description Modifier [id number] - WC PHYS LEVEL 3 - EST PT ICD-10 Diagnosis Description L89.154 Pressure ulcer of sacral region, stage 4 L89.620 Pressure ulcer of left heel, unstageable L89.510 Pressure ulcer of right ankle, unstageable  L89.523 Pressure ulcer of left ankle, stage 3 Quantity: 1 Electronic Signature(s) Signed: 09/02/2022 4:48:03 PM By: Midge Aver MSN RN CNS WTA Signed: 09/03/2022 9:43:21 AM By: Geralyn Corwin DO Previous Signature: 09/02/2022 4:38:43 PM Version By: Geralyn Corwin DO Previous Signature: 09/02/2022 4:06:15 PM Version By: Geralyn Corwin DO Entered By: Midge Aver on 09/02/2022 16:48:02

## 2022-11-04 ENCOUNTER — Encounter (HOSPITAL_BASED_OUTPATIENT_CLINIC_OR_DEPARTMENT_OTHER): Payer: Medicare Other | Admitting: Internal Medicine

## 2022-11-04 DIAGNOSIS — G35 Multiple sclerosis: Secondary | ICD-10-CM | POA: Diagnosis not present

## 2022-11-04 DIAGNOSIS — M4628 Osteomyelitis of vertebra, sacral and sacrococcygeal region: Secondary | ICD-10-CM | POA: Diagnosis not present

## 2022-11-04 DIAGNOSIS — L89154 Pressure ulcer of sacral region, stage 4: Secondary | ICD-10-CM

## 2022-11-05 NOTE — Progress Notes (Signed)
[name] ([id number]) [id number] Page 1 of 8 Visit Report for 11/04/2022 Chief Complaint Document Details Patient Name: Date of Service: [name] STRA [name], [name] 11/04/2022 12:30 PM Medical Record Number: [id number] Patient Account Number: [id number] Date of Birth/Sex: Treating RN: 06-16-1960 (62 y.o. [name] Primary Care Provider: Enid Baas Other Clinician: Referring Provider: Treating Provider/Extender: Rica Mote Weeks in Treatment: 26 Information Obtained from: Patient Chief Complaint 05/06/2022; Bilateral feet wounds and sacral wounds Electronic Signature(s) Signed: 11/04/2022 2:12:08 PM By: Geralyn Corwin DO Entered By: Geralyn Corwin on 11/04/2022 13:44:26 -------------------------------------------------------------------------------- HPI Details Patient Name: Date of Service: [name], [name] 11/04/2022 12:30 PM Medical Record Number: [id number] Patient Account Number: [id number] Date of Birth/Sex: Treating RN: Jun 23, 1960 (62 y.o. [name] Primary Care Provider: Enid Baas Other Clinician: Referring Provider: Treating Provider/Extender: Rica Mote Weeks in Treatment: 47 History of Present Illness HPI Description: 05/06/2022 [name] is a 62 year old female with a past medical history of multiple sclerosis and PEs and DVT that presents to the clinic for sacral s wounds and Bilateral feet wounds. Patient states that she obtained the sacral ulcers when she was hospitalized in November 2023 for bilateral pulmonary embolisms. She was in the ICU on a ventilator. Since discharge she has been using Dakin's wet-to-dry dressings to the area. Since her hospitalization she has been bedbound. However she is doing physical therapy. Prior to being hospitalized she was able to pull herself up and transfer from bed to chair on her own. Due  to her MS she is not fully mobile. Unfortunately she states that the wound has gotten larger on her sacrum despite Wound care. Patient has home health. On 04/16/2022 she was admitted to the hospital for severe sepsis secondary to acute UTI and sacral cellulitis. She completed 5 days of IV cefepime and vancomycin. She was not discharged with oral antibiotics. She states she developed the bilateral feet wounds at that time. She states she has bunny boots however she is not wearing them. She currently denies systemic signs of infection. 2/7; patient presents for follow-up. She had a bone biopsy done at last clinic visit that showed fragments of inflamed granulation tissue, necrotic material and bone with acute osteomyelitis. Culture grew Proteus mirabilis and Klebsiella pneumoniae. Patient is scheduled to see infectious disease next week. For now she has been doing Dakin's wet-to-dry dressings to the sacrum. She is not able to obtain Santyl and has been keeping the left heel covered. T the right foot where o there was a previous wound with scab however this has healed. This has remained closed. No open wound noted. She states over the past week she has had chills but no fevers, nausea/vomiting or purulent drainage. 2/21; patient presents for follow-up. She saw Dr. Joylene Draft on 2/15. 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 Today she had feces Tappan, [name] ([id number]) [id number] Page 2 of 8 impacted in the wound and throughout the tunneling. We discussed a diverting colostomy to address this issue. She was agreeable with a referral to general surgery. She currently denies systemic signs of infection. She has been using Medihoney and Hydrofera Blue to the left heel wound. She has been using her Prevalon boots. 3/20; patient presents for follow-up. She has been using Dakin's wet-to-dry dressings to the sacral wound. She has developed a new wound to  her right lateral heel. She is using Medihoney and Hydrofera Blue to the left heel. She is  currently taking Levaquin to complete 6 weeks. She follows with infectious disease for this. She saw general surgery at [facility] to discuss potentially doing a diverting colostomy however per patient's surgeon did not recommend this. 4/17; patient presents for follow-up. Has been using Dakin's wet-to-dry dressings to the sacral wound. She has been using Medihoney and Hydrofera Blue to the heel wounds. She states she is using her bunny boots for offloading the heels. She has not heard from plastic surgery for consultation. We gave her the number to call to follow this up. 5/22; patient presents for follow-up. She saw Dr. Ferd Hibbs on 5/8, plastic surgery to discuss potentially doing surgical debridement with muscle flap. Per Dr. Timothy Lasso note Review of the surgery and requirements were discussed with the patient. Patient would like to hold off on proceeding with this option. Wound VAC was recommended and based on the wound today this option seemed reasonable to get started. Patient has home health that will be able to change the wound VAC. She has been using Dakin's wet-to-dry dressings to the sacrum. Patient has a new wound to the left medial ankle. She states she wears bunny boots but does not while in the wheelchair. 6/1; the patient has started the wound VAC as of Monday to the sacral area. Home health is out to change the dressing. She also has wounds on her bilateral ankles and left heel she is using Medihoney and Hydrofera Blue to these areas. She has advanced MS with contractures. Both the patient and her husband say she is eating a high-protein dilated well. She has some form of air mattress but the bed is from sleep number. I am uncertain about the offloading part of this 6/12; patient presents for follow-up. She has had a wound VAC to the sacral wound and home health has been changing this. She has had no  issues with this. She has been using Hydrofera Blue and Medihoney to the bilateral ankle and left heel wounds. The right ankle wound is healed. 6/26; patient presents for follow-up. Has been using a wound VAC to the sacral wound. She has no issues with this. Wound is smaller. She had ABIs on the right that were 1.27 and on the left that was 1.35 with a TBI of 1.18. She had triphasic waveforms throughout. She is been using Hydrofera Blue and Medihoney to the left heel wounds. These have closed. She denies signs of infection. 7/10; patient presents for follow-up. She continues to use a wound VAC to the sacral wound. She has slight skin irritation to the periwound from the drape. Overall wound appears healing. She has no other open wounds to her lower extremities. She currently denies systemic signs of infection. 7/24; patient presents for follow-up. She continues to use the wound VAC to the sacral wound. There is no issues or complaints. Electronic Signature(s) Signed: 11/04/2022 2:12:08 PM By: Geralyn Corwin DO Entered By: Geralyn Corwin on 11/04/2022 13:44:56 -------------------------------------------------------------------------------- Physical Exam Details Patient Name: Date of Service: [name], [name] 11/04/2022 12:30 PM Medical Record Number: [id number] Patient Account Number: [id number] Date of Birth/Sex: Treating RN: 1960-09-20 (62 y.o. [name] Primary Care Provider: Enid Baas Other Clinician: Referring Provider: Treating Provider/Extender: Rica Mote Weeks in Treatment: 26 Constitutional . Psychiatric . Notes T the sacrum there is granulation tissue that probes to bone at the 12 o'clock position. Undermining from 10-1, 26:00. No signs of surrounding soft tissue o infection. Mild irritation to the periwound. Electronic Signature(s) Signed:  11/04/2022 2:12:08 PM By: Geralyn Corwin DO Entered By: Geralyn Corwin on  11/04/2022 13:46:08 [name] ([id number]) [id number] Page 3 of 8 -------------------------------------------------------------------------------- Physician Orders Details Patient Name: Date of Service: [name], [name] 11/04/2022 12:30 PM Medical Record Number: [id number] Patient Account Number: [id number] Date of Birth/Sex: Treating RN: 1960-11-28 (62 y.o. [name] Primary Care Provider: Enid Baas Other Clinician: Referring Provider: Treating Provider/Extender: Rica Mote Weeks in Treatment: 23 Verbal / Phone Orders: No Diagnosis Coding Home Health CONTINUE Home Health for wound care. May utilize formulary equivalent dressing for wound treatment orders unless otherwise specified. Home Health Nurse may visit PRN to address patients wound care needs. - ADORATION May apply Duoderm under the drape to help protect the skin **Please direct any NON-WOUND related issues/requests for orders to patient's Primary Care Physician. **If current dressing causes regression in wound condition, may D/C ordered dressing product/s and apply Normal Saline Moist Dressing daily until next Wound Healing Center or Other MD appointment. **Notify Wound Healing Center of regression in wound condition at [phone]. Negative Pressure Wound Therapy Wound #3 Proximal,Midline Sacrum Wound VAC settings at continuous pressure. Use foam to wound cavity. Please order WHITE foam to fill any tunnel/s and/or undermining when necessary. Change VAC dressing 3 X WEEK. Change canister as indicated when full. - white foam in tunnels followed by black foam , Wound vac to be applied by home health nurse for Adoration, when patient is seen in clinic wound vac will be removed and replaced with wet to dry dressing , home health to reapply wound vac that same day . Home Health Nurse may d/c VAC for s/s of increased infection,  significant wound regression, or uncontrolled drainage. Notify Wound Healing Center at [phone]. - ADORATION Wound Treatment Wound #3 - Sacrum Wound Laterality: Midline, Proximal Cleanser: Vashe 5.8 (oz) 3 x Per Week/30 Days Discharge Instructions: Use vashe 5.8 (oz) as directed Add-Ons: Wound Vac 3 x Per Week/30 Days Electronic Signature(s) Signed: 11/04/2022 2:25:53 PM By: Midge Aver MSN RN CNS WTA Signed: 11/04/2022 4:29:46 PM By: Geralyn Corwin DO Previous Signature: 11/04/2022 2:12:08 PM Version By: Geralyn Corwin DO Entered By: Midge Aver on 11/04/2022 14:25:52 -------------------------------------------------------------------------------- Problem List Details Patient Name: Date of Service: [name], [name] 11/04/2022 12:30 PM Medical Record Number: [id number] Patient Account Number: [id number] Date of Birth/Sex: Treating RN: [birth year] (62 y.o. [name] Primary Care Provider: Enid Baas Other Clinician: Referring Provider: Treating Provider/Extender: Rica Mote [place], [place] ([id number]) [id number] Page 4 of 8 Weeks in Treatment: 26 Active Problems ICD-10 Encounter Code Description Active Date MDM Diagnosis L89.154 Pressure ulcer of sacral region, stage 4 05/06/2022 No Yes I26.99 Other pulmonary embolism without acute cor pulmonale 05/06/2022 No Yes 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, sacral and sacrococcygeal region 05/20/2022 No Yes G35 Multiple sclerosis 05/06/2022 No Yes I82.409 Acute embolism and thrombosis of unspecified deep veins of unspecified 05/06/2022 No Yes lower extremity Z79.01 Long term (current) use of anticoagulants 05/06/2022 No Yes Inactive Problems ICD-10 Code Description Active Date Inactive Date L89.153 Pressure ulcer of sacral region, stage 3 05/06/2022 05/06/2022 Resolved Problems ICD-10 Code Description Active Date Resolved Date L89.620 Pressure ulcer of  left heel, unstageable 05/06/2022 05/06/2022 L89.510 Pressure ulcer of right ankle, unstageable 09/02/2022 09/02/2022 L89.523 Pressure ulcer of left ankle, stage 3 09/02/2022 09/02/2022 Electronic Signature(s) Signed: 11/04/2022 2:12:08 PM By: Geralyn Corwin DO Signed: 11/05/2022 4:43:06 PM By: Midge Aver MSN RN CNS WTA Entered By: Midge Aver  on 11/04/2022 13:44:36 [name], [name] ([id number]) [phone].pdf Page 5 of 8 -------------------------------------------------------------------------------- Progress Note Details Patient Name: Date of Service: [name], [name] 11/04/2022 12:30 PM Medical Record Number: [id number] Patient Account Number: [id number] Date of Birth/Sex: Treating RN: 10-Sep-1960 (62 y.o. [name] Primary Care Provider: Enid Baas Other Clinician: Referring Provider: Treating Provider/Extender: Rica Mote Weeks in Treatment: 105 Subjective Chief Complaint Information obtained from Patient 05/06/2022; Bilateral feet wounds and sacral wounds History of Present Illness (HPI) 05/06/2022 [name] is a 62 year old female with a past medical history of multiple sclerosis and PEs and DVT that presents to the clinic for sacral s wounds and Bilateral feet wounds. Patient states that she obtained the sacral ulcers when she was hospitalized in November 2023 for bilateral pulmonary embolisms. She was in the ICU on a ventilator. Since discharge she has been using Dakin's wet-to-dry dressings to the area. Since her hospitalization she has been bedbound. However she is doing physical therapy. Prior to being hospitalized she was able to pull herself up and transfer from bed to chair on her own. Due to her MS she is not fully mobile. Unfortunately she states that the wound has gotten larger on her sacrum despite Wound care. Patient has home health. On 04/16/2022 she was admitted to the hospital  for severe sepsis secondary to acute UTI and sacral cellulitis. She completed 5 days of IV cefepime and vancomycin. She was not discharged with oral antibiotics. She states she developed the bilateral feet wounds at that time. She states she has bunny boots however she is not wearing them. She currently denies systemic signs of infection. 2/7; patient presents for follow-up. She had a bone biopsy done at last clinic visit that showed fragments of inflamed granulation tissue, necrotic material and bone with acute osteomyelitis. Culture grew Proteus mirabilis and Klebsiella pneumoniae. Patient is scheduled to see infectious disease next week. For now she has been doing Dakin's wet-to-dry dressings to the sacrum. She is not able to obtain Santyl and has been keeping the left heel covered. T the right foot where o there was a previous wound with scab however this has healed. This has remained closed. No open wound noted. She states over the past week she has had chills but no fevers, nausea/vomiting or purulent drainage. 2/21; patient presents for follow-up. She saw Dr. Joylene Draft on 2/15. 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 Today she had feces impacted in the wound and throughout the tunneling. We discussed a diverting colostomy to address this issue. She was agreeable with a referral to general surgery. She currently denies systemic signs of infection. She has been using Medihoney and Hydrofera Blue to the left heel wound. She has been using her Prevalon boots. 3/20; patient presents for follow-up. She has been using Dakin's wet-to-dry dressings to the sacral wound. She has developed a new wound to her right lateral heel. She is using Medihoney and Hydrofera Blue to the left heel. She is currently taking Levaquin to complete 6 weeks. She follows with infectious disease for this. She saw general surgery at [facility] to discuss potentially doing a diverting colostomy however per  patient's surgeon did not recommend this. 4/17; patient presents for follow-up. Has been using Dakin's wet-to-dry dressings to the sacral wound. She has been using Medihoney and Hydrofera Blue to the heel wounds. She states she is using her bunny boots for offloading the heels. She has not heard from plastic surgery for  consultation. We gave her the number to call to follow this up. 5/22; patient presents for follow-up. She saw Dr. Ferd Hibbs on 5/8, plastic surgery to discuss potentially doing surgical debridement with muscle flap. Per Dr. Timothy Lasso note Review of the surgery and requirements were discussed with the patient. Patient would like to hold off on proceeding with this option. Wound VAC was recommended and based on the wound today this option seemed reasonable to get started. Patient has home health that will be able to change the wound VAC. She has been using Dakin's wet-to-dry dressings to the sacrum. Patient has a new wound to the left medial ankle. She states she wears bunny boots but does not while in the wheelchair. 6/1; the patient has started the wound VAC as of Monday to the sacral area. Home health is out to change the dressing. She also has wounds on her bilateral ankles and left heel she is using Medihoney and Hydrofera Blue to these areas. She has advanced MS with contractures. Both the patient and her husband say she is eating a high-protein dilated well. She has some form of air mattress but the bed is from sleep number. I am uncertain about the offloading part of this 6/12; patient presents for follow-up. She has had a wound VAC to the sacral wound and home health has been changing this. She has had no issues with this. She has been using Hydrofera Blue and Medihoney to the bilateral ankle and left heel wounds. The right ankle wound is healed. 6/26; patient presents for follow-up. Has been using a wound VAC to the sacral wound. She has no issues with this. Wound is smaller. She  had ABIs on the right that were 1.27 and on the left that was 1.35 with a TBI of 1.18. She had triphasic waveforms throughout. She is been using Hydrofera Blue and Medihoney to the left heel wounds. These have closed. She denies signs of infection. 7/10; patient presents for follow-up. She continues to use a wound VAC to the sacral wound. She has slight skin irritation to the periwound from the drape. Overall wound appears healing. She has no other open wounds to her lower extremities. She currently denies systemic signs of infection. 7/24; patient presents for follow-up. She continues to use the wound VAC to the sacral wound. There is no issues or complaints. Objective Constitutional Vitals Time Taken: 12:50 PM, Temperature: 98.2 F, Pulse: 127 bpm, Respiratory Rate: 18 breaths/min, Blood Pressure: 104/70 mmHg. General Notes: T the sacrum there is granulation tissue that probes to bone at the 12 o'clock position. Undermining from 10-1, 26:00. No signs of surrounding o soft tissue infection. Mild irritation to the periwound. [name] ([id number]) [id number] Page 6 of 8 Integumentary (Hair, Skin) Wound #3 status is Open. Original cause of wound was Pressure Injury. The date acquired was: 03/09/2022. The wound has been in treatment 26 weeks. The wound is located on the Proximal,Midline Sacrum. The wound measures 2.6cm length x 1.2cm width x 2cm depth; 2.45cm^2 area and 4.901cm^3 volume. There is muscle and Fat Layer (Subcutaneous Tissue) exposed. There is undermining starting at 8:00 and ending at 7:00 with a maximum distance of 3.2cm. There is a large amount of serosanguineous drainage noted. The wound margin is epibole. There is large (67-100%) red, hyper - granulation within the wound bed. There is a small (1-33%) amount of necrotic tissue within the wound bed. Assessment Active Problems ICD-10 Pressure ulcer of sacral region, stage 4 Other pulmonary  embolism without acute  cor pulmonale Osteomyelitis of vertebra, sacral and sacrococcygeal region Multiple sclerosis Acute embolism and thrombosis of unspecified deep veins of unspecified lower extremity Long term (current) use of anticoagulants Overall patient's wound appears well-healing except for 1 area at the 12 o'clock position that has minimal exposed bone. I recommended continuing aggressive offloading. Unfortunately patient cannot do this very well. Home health is changing the wound VAC. She can use Dakin's wet-to-dry dressings in between [facility] change from when she sees [place] in clinic to when she sees home health. Follow-up in 2 weeks. Plan Home Health: [facility] for wound care. May utilize formulary equivalent dressing for wound treatment orders unless otherwise specified. Home Health Nurse may visit PRN to address patients wound care needs. - ADORATION May apply Duoderm under the drape to help protect the skin **Please direct any NON-WOUND related issues/requests for orders to patient's Primary Care Physician. **If current dressing causes regression in wound condition, may D/C ordered dressing product/s and apply Normal Saline Moist Dressing daily until next Wound Healing Center or Other MD appointment. **Notify Wound Healing Center of regression in wound condition at [phone]. Negative Pressure Wound Therapy: Wound #3 Proximal,Midline Sacrum: Wound VAC settings at continuous pressure. Use foam to wound cavity. Please order WHITE foam to fill any tunnel/s and/or undermining when necessary. Change VAC dressing 3 X WEEK. Change canister as indicated when full. - white foam in tunnels followed by black foam , Wound vac to be applied by home health nurse for Adoration, when patient is seen in clinic wound vac will be removed and replaced with wet to dry dressing , home health to reapply wound vac that same day . Home Health Nurse may d/c VAC for s/s of increased  infection, significant wound regression, or uncontrolled drainage. Notify Wound Healing Center at [phone]. - ADORATION WOUND #3: - Sacrum Wound Laterality: Midline, Proximal Cleanser: Vashe 5.8 (oz) 3 x Per Week/30 Days Discharge Instructions: Use vashe 5.8 (oz) as directed Add-Ons: Wound Vac 3 x Per Week/30 Days 1. Continue wound VAC 2. Dakin's wet-to-dry dressings between VAC changes 3. Aggressive offloading 4. Follow-up in 1 week Electronic Signature(s) Signed: 11/04/2022 2:12:08 PM By: Geralyn Corwin DO Entered By: Geralyn Corwin on 11/04/2022 13:48:39 -------------------------------------------------------------------------------- ROS/PFSH Details Patient Name: Date of Service: [name], [name] 11/04/2022 12:30 PM Medical Record Number: [id number] Patient Account Number: [id number] [name], [name] ([id number]) [id number] Page 7 of 8 Date of Birth/Sex: Treating RN: 1960/11/16 (62 y.o. [name] Primary Care Provider: Other Clinician: Enid Baas Referring Provider: Treating Provider/Extender: Rica Mote Weeks in Treatment: 26 Information Obtained From Patient Respiratory Medical History: Past Medical History Notes: pulmonary hemorrage Cardiovascular Medical History: Positive for: Angina; Hypotension Endocrine Medical History: Negative for: Type II Diabetes Integumentary (Skin) Medical History: Negative for: History of pressure wounds Neurologic Medical History: Past Medical History Notes: MS Immunizations Pneumococcal Vaccine: Received Pneumococcal Vaccination: No Implantable Devices None Family and Social History Never smoker; Marital Status - Widowed; Alcohol Use: Never; Caffeine Use: Daily Electronic Signature(s) Signed: 11/04/2022 2:12:08 PM By: Geralyn Corwin DO Signed: 11/05/2022 4:43:06 PM By: Midge Aver MSN RN CNS WTA Entered By: Geralyn Corwin on 11/04/2022  13:49:16 -------------------------------------------------------------------------------- SuperBill Details Patient Name: Date of Service: [name], [name] 11/04/2022 Medical Record Number: [id number] Patient Account Number: [id number] Date of Birth/Sex: Treating RN: 1961-01-17 (62 y.o. [name] Primary Care Provider: Enid Baas Other Clinician: Referring Provider: Treating Provider/Extender: Rica Mote Weeks in Treatment:  [address] Diagnosis Coding [name], [name] ([id number]) [id number] Page 8 of 8 ICD-10 Codes Code Description L89.154 Pressure ulcer of sacral region, stage 4 I26.99 Other pulmonary embolism without acute cor pulmonale 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, sacral and sacrococcygeal region G35 Multiple sclerosis I82.409 Acute embolism and thrombosis of unspecified deep veins of unspecified lower extremity Z79.01 Long term (current) use of anticoagulants Facility Procedures : CPT4 Code: [id number] Description: 99213 - WOUND CARE VISIT-LEV 3 EST PT Modifier: Quantity: 1 Physician Procedures : CPT4 Code Description Modifier [id number] 99213 - WC PHYS LEVEL 3 - EST PT ICD-10 Diagnosis Description L89.154 Pressure ulcer of sacral region, 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, sacral and sacrococcygeal region G35 Multiple sclerosis Quantity: 1 Electronic Signature(s) Signed: 11/04/2022 2:26:20 PM By: Midge Aver MSN RN CNS WTA Signed: 11/04/2022 4:29:46 PM By: Geralyn Corwin DO Previous Signature: 11/04/2022 2:12:08 PM Version By: Geralyn Corwin DO Entered By: Midge Aver on 11/04/2022 14:26:19

## 2022-11-05 NOTE — Progress Notes (Signed)
[name] ([id number]) [id number] Page 1 of 10 Visit Report for 11/04/2022 Arrival Information Details Patient Name: Date of Service: [name] STRA ND, [name] 11/04/2022 12:30 PM Medical Record Number: [id number] Patient Account Number: [id number] Date of Birth/Sex: Treating RN: 12/02/1960 (62 y.o. [name] Primary Care Abeer Iversen: Enid Baas Other Clinician: Referring Luismanuel Corman: Treating Aleda Madl/Extender: Rica Mote Weeks in Treatment: 26 Visit Information History Since Last Visit Added or deleted any medications: No Patient Arrived: Wheel Chair Any new allergies or adverse reactions: No Arrival Time: 12:47 Has Dressing in Place as Prescribed: Yes Accompanied By: friend Pain Present Now: No Transfer Assistance: Nurse, adult Patient Identification Verified: Yes Secondary Verification Process Completed: Yes Patient Requires Transmission-Based Precautions: No Patient Has Alerts: Yes Patient Alerts: Patient on Blood Thinner Not Diabetic Xarelto Electronic Signature(s) Signed: 11/04/2022 2:24:18 PM By: Midge Aver MSN RN CNS WTA Entered By: Midge Aver on 11/04/2022 14:24:18 -------------------------------------------------------------------------------- Clinic Level of Care Assessment Details Patient Name: Date of Service: [name], [name] 11/04/2022 12:30 PM Medical Record Number: [id number] Patient Account Number: [id number] Date of Birth/Sex: Treating RN: 06/24/1960 (62 y.o. [name] Primary Care Chaz Mcglasson: Enid Baas Other Clinician: Referring Tamitha Norell: Treating Rayansh Herbst/Extender: Rica Mote Weeks in Treatment: 26 Clinic Level of Care Assessment Items TOOL 4 Quantity Score X- 1 0 Use when only an EandM is performed on FOLLOW-UP visit ASSESSMENTS - Nursing Assessment / Reassessment X- 1 10 Reassessment of Co-morbidities (includes updates in  patient status) X- 1 5 Reassessment of Adherence to Treatment Plan ASSESSMENTS - Wound and Skin A ssessment / Reassessment []  - 0 Simple Wound Assessment / Reassessment - one wound [name] ([id number]) [id number] Page 2 of 10 []  - 0 Complex Wound Assessment / Reassessment - multiple wounds []  - 0 Dermatologic / Skin Assessment (not related to wound area) ASSESSMENTS - Focused Assessment []  - 0 Circumferential Edema Measurements - multi extremities []  - 0 Nutritional Assessment / Counseling / Intervention []  - 0 Lower Extremity Assessment (monofilament, tuning fork, pulses) []  - 0 Peripheral Arterial Disease Assessment (using hand held doppler) ASSESSMENTS - Ostomy and/or Continence Assessment and Care []  - 0 Incontinence Assessment and Management []  - 0 Ostomy Care Assessment and Management (repouching, etc.) PROCESS - Coordination of Care X - Simple Patient / Family Education for ongoing care 1 15 []  - 0 Complex (extensive) Patient / Family Education for ongoing care X- 1 10 Staff obtains Chiropractor, Records, T Results / Process Orders est []  - 0 Staff telephones HHA, Nursing Homes / Clarify orders / etc []  - 0 Routine Transfer to another Facility (non-emergent condition) []  - 0 Routine Hospital Admission (non-emergent condition) []  - 0 New Admissions / Manufacturing engineer / Ordering NPWT Apligraf, etc. , []  - 0 Emergency Hospital Admission (emergent condition) X- 1 10 Simple Discharge Coordination []  - 0 Complex (extensive) Discharge Coordination PROCESS - Special Needs []  - 0 Pediatric / Minor Patient Management []  - 0 Isolation Patient Management []  - 0 Hearing / Language / Visual special needs []  - 0 Assessment of Community assistance (transportation, D/C planning, etc.) []  - 0 Additional assistance / Altered mentation []  - 0 Support Surface(s) Assessment (bed, cushion, seat, etc.) INTERVENTIONS - Wound  Cleansing / Measurement X - Simple Wound Cleansing - one wound 1 5 []  - 0 Complex Wound Cleansing - multiple wounds X- 1 5 Wound Imaging (photographs - any number of wounds) []  - 0 Wound Tracing (instead of photographs) X- 1  5 Simple Wound Measurement - one wound []  - 0 Complex Wound Measurement - multiple wounds INTERVENTIONS - Wound Dressings []  - 0 Small Wound Dressing one or multiple wounds X- 1 15 Medium Wound Dressing one or multiple wounds []  - 0 Large Wound Dressing one or multiple wounds []  - 0 Application of Medications - topical []  - 0 Application of Medications - injection INTERVENTIONS - Miscellaneous []  - 0 External ear exam []  - 0 Specimen Collection (cultures, biopsies, blood, body fluids, etc.) [name], [name] ([id number]) [id number] Page 3 of 10 []  - 0 Specimen(s) / Culture(s) sent or taken to Lab for analysis []  - 0 Patient Transfer (multiple staff / Michiel Sites Lift / Similar devices) []  - 0 Simple Staple / Suture removal (25 or less) []  - 0 Complex Staple / Suture removal (26 or more) []  - 0 Hypo / Hyperglycemic Management (close monitor of Blood Glucose) []  - 0 Ankle / Brachial Index (ABI) - do not check if billed separately X- 1 5 Vital Signs Has the patient been seen at the hospital within the last three years: Yes Total Score: 85 Level Of Care: New/Established - Level 3 Electronic Signature(s) Signed: 11/05/2022 4:43:06 PM By: Midge Aver MSN RN CNS WTA Entered By: Midge Aver on 11/04/2022 14:25:57 -------------------------------------------------------------------------------- Encounter Discharge Information Details Patient Name: Date of Service: [name], [name] 11/04/2022 12:30 PM Medical Record Number: [id number] Patient Account Number: [id number] Date of Birth/Sex: Treating RN: 05-05-1960 (62 y.o. [name] Primary Care Hersh Minney: Enid Baas Other Clinician: Referring  Kellin Fifer: Treating Casee Knepp/Extender: Rica Mote Weeks in Treatment: 26 Encounter Discharge Information Items Discharge Condition: Stable Ambulatory Status: Wheelchair Discharge Destination: Home Transportation: Private Auto Accompanied By: friend Schedule Follow-up Appointment: Yes Clinical Summary of Care: Electronic Signature(s) Signed: 11/04/2022 2:32:37 PM By: Midge Aver MSN RN CNS WTA Entered By: Midge Aver on 11/04/2022 14:32:37 -------------------------------------------------------------------------------- Lower Extremity Assessment Details Patient Name: Date of Service: [name], [name] 11/04/2022 12:30 PM Medical Record Number: [id number] Patient Account Number: [id number] Date of Birth/Sex: Treating RN: [birth year] (61 y.o. [name], [name], [name] ([id number]) [id number] Page 4 of 10 Primary Care Caitlen Worth: Enid Baas Other Clinician: Referring Faron Tudisco: Treating Malachai Schalk/Extender: Rica Mote Weeks in Treatment: 26 Electronic Signature(s) Signed: 11/04/2022 2:24:56 PM By: Midge Aver MSN RN CNS WTA Entered By: Midge Aver on 11/04/2022 14:24:56 -------------------------------------------------------------------------------- Multi Wound Chart Details Patient Name: Date of Service: [name], [name] 11/04/2022 12:30 PM Medical Record Number: [id number] Patient Account Number: [id number] Date of Birth/Sex: Treating RN: [birth year] (62 y.o. [name] Primary Care Monicka Cyran: Enid Baas Other Clinician: Referring Naturi Alarid: Treating Tillie Viverette/Extender: Rica Mote Weeks in Treatment: 26 Vital Signs Height(in): Pulse(bpm): 127 Weight(lbs): Blood Pressure(mmHg): 104/70 Body Mass Index(BMI): Temperature(F): 98.2 Respiratory Rate(breaths/min): 18 [3:Photos:] [N/A:N/A] Proximal, Midline Sacrum N/A N/A Wound  Location: Pressure Injury N/A N/A Wounding Event: Pressure Ulcer N/A N/A Primary Etiology: Angina, Hypotension N/A N/A Comorbid History: 03/09/2022 N/A N/A Date Acquired: 47 N/A N/A Weeks of Treatment: Open N/A N/A Wound Status: No N/A N/A Wound Recurrence: 2.6x1.2x2 N/A N/A Measurements L x W x D (cm) 2.45 N/A N/A A (cm) : rea 4.901 N/A N/A Volume (cm) : 89.60% N/A N/A % Reduction in A rea: 96.50% N/A N/A % Reduction in Volume: 8 Starting Position 1 (o'clock): 7 Ending Position 1 (o'clock): 3.2 Maximum Distance 1 (cm): Yes N/A N/A Undermining: Category/Stage IV N/A N/A Classification: Large N/A N/A Exudate  A mount: Serosanguineous N/A N/A Exudate Type: red, brown N/A N/A Exudate Color: Epibole N/A N/A Wound Margin: Large (67-100%) N/A N/A Granulation A mount: Red, Hyper-granulation N/A N/A Granulation Quality: Small (1-33%) N/A N/A Necrotic A mount: Fat Layer (Subcutaneous Tissue): Yes N/A N/A Exposed Structures: Muscle: Yes Fascia: No [name] ([id number]) [id number] Page 5 of 10 Tendon: No Joint: No Bone: No None N/A N/A Epithelialization: Treatment Notes Wound #3 (Sacrum) Wound Laterality: Midline, Proximal Cleanser Vashe 5.8 (oz) Discharge Instruction: Use vashe 5.8 (oz) as directed Peri-Wound Care Topical Primary Dressing Secondary Dressing Secured With Compression Wrap Compression Stockings Add-Ons Wound Vac Electronic Signature(s) Signed: 11/04/2022 2:25:20 PM By: Midge Aver MSN RN CNS WTA Previous Signature: 11/04/2022 2:12:08 PM Version By: Geralyn Corwin DO Entered By: Midge Aver on 11/04/2022 14:25:20 -------------------------------------------------------------------------------- Multi-Disciplinary Care Plan Details Patient Name: Date of Service: [name], [name] 11/04/2022 12:30 PM Medical Record Number: [id number] Patient Account Number: [id number] Date of Birth/Sex:  Treating RN: 1961/01/17 (62 y.o. [name] Primary Care Anginette Espejo: Enid Baas Other Clinician: Referring Emmajo Bennette: Treating Artist Bloom/Extender: Rica Mote Weeks in Treatment: 26 Active Inactive Wound/Skin Impairment Nursing Diagnoses: Impaired tissue integrity Knowledge deficit related to ulceration/compromised skin integrity Goals: Patient/caregiver will verbalize understanding of skin care regimen Date Initiated: 05/06/2022 Date Inactivated: 07/29/2022 Target Resolution Date: 08/28/2022 Goal Status: Met Ulcer/skin breakdown will have a volume reduction of 30% by week 4 Date Initiated: 05/06/2022 Date Inactivated: 07/29/2022 Target Resolution Date: 06/06/2022 Goal Status: Unmet Unmet Reason: not 30% decrease Ulcer/skin breakdown will have a volume reduction of 50% by week 8 Date Initiated: 05/20/2022 Date Inactivated: 07/29/2022 Target Resolution Date: 07/05/2022 Unmet Reason: not 50% decrease in [place], [place] ([id number]) [id number] Page 6 of 10 Unmet Reason: not 50% decrease in Goal Status: Unmet size Ulcer/skin breakdown will have a volume reduction of 80% by week 12 Date Initiated: 05/20/2022 Date Inactivated: 07/29/2022 Target Resolution Date: 08/05/2022 Unmet Reason: not 80% decrease in Goal Status: Unmet size Ulcer/skin breakdown will heal within 14 weeks Date Initiated: 05/20/2022 Target Resolution Date: 12/12/2022 Goal Status: Active Interventions: Assess patient/caregiver ability to perform ulcer/skin care regimen upon admission and as needed Assess ulceration(s) every visit Provide education on ulcer and skin care Screen for HBO Treatment Activities: Skin care regimen initiated : 05/06/2022 Topical wound management initiated : 05/06/2022 Notes: Negative Pressure Wound Therapy Nursing Diagnoses: Knowledge deficit related to use and safety of the device Impaired tissue  integrity Goals: Patient/caregiver agrees to and verbalizes understanding of need to use Date Initiated: 09/14/2022 Date Inactivated: 10/07/2022 Target Resolution Date: 10/14/2022 Goal Status: Met Patient/caregiver will verbalize understanding of use of the device Date Initiated: 09/14/2022 Date Inactivated: 10/21/2022 Target Resolution Date: 10/14/2022 Goal Status: Met Promote granulation tissue Date Initiated: 09/14/2022 Target Resolution Date: 11/04/2022 Goal Status: Active Reduce inflammatory exudate Date Initiated: 09/14/2022 Target Resolution Date: 10/31/2022 Goal Status: Active Interventions: Assess drainage (amount and color) Assess patient nutrition upon admission and as needed per policy Assess patient understanding of disease process and management Assess patient/caregiver ability to obtain necessary supplies Assess wound bed for efficacy of treatment Monitor and protect skin around the wound Provide education on nutrition Provide education on use, care, and troubleshooting Treatment Activities: Incontinence or moisture care in place (i.e., Barrier creams, foley catheter, colostomy, diapers, etc.) : 09/02/2022 Patient referred to home health care : 09/02/2022 Referred to DME Alaynna Kerwood for dressing supplies : 09/02/2022 Support surface (Group 2/3) in use : 09/02/2022 Turning and Repositioning schedule in place : 09/02/2022  Notes: Electronic Signature(s) Signed: 11/05/2022 4:43:06 PM By: Midge Aver MSN RN CNS WTA Entered By: Midge Aver on 11/04/2022 13:44:16 [name] ([id number]) [id number] Page 7 of 10 -------------------------------------------------------------------------------- Pain Assessment Details Patient Name: Date of Service: [name], [name] 11/04/2022 12:30 PM Medical Record Number: [id number] Patient Account Number: [id number] Date of Birth/Sex: Treating RN: Sep 29, 1960 (62 y.o. [name] Primary Care Dezarai Prew:  Enid Baas Other Clinician: Referring Ebrima Ranta: Treating Lyrik Buresh/Extender: Rica Mote Weeks in Treatment: 26 Active Problems Location of Pain Severity and Description of Pain Patient Has Paino Yes Site Locations Rate the pain. Current Pain Level: 0 Pain Management and Medication Current Pain Management: Electronic Signature(s) Signed: 11/04/2022 2:24:27 PM By: Midge Aver MSN RN CNS WTA Entered By: Midge Aver on 11/04/2022 14:24:26 -------------------------------------------------------------------------------- Patient/Caregiver Education Details Patient Name: Date of Service: [name], [name] 7/24/2024andnbsp12:30 PM Medical Record Number: [id number] Patient Account Number: [id number] Date of Birth/Gender: Treating RN: July 21, 1960 (62 y.o. [name] Primary Care Physician: Enid Baas Other Clinician: Referring Physician: Treating Physician/Extender: Rica Mote Weeks in Treatment: [address], [place] ([id number]) [id number] Page 8 of 10 Education Assessment Education Provided To: Patient Education Topics Provided Wound/Skin Impairment: Handouts: Caring for Your Ulcer Methods: Explain/Verbal Responses: State content correctly Electronic Signature(s) Signed: 11/05/2022 4:43:06 PM By: Midge Aver MSN RN CNS WTA Entered By: Midge Aver on 11/04/2022 13:44:28 -------------------------------------------------------------------------------- Wound Assessment Details Patient Name: Date of Service: [name], [name] 11/04/2022 12:30 PM Medical Record Number: [id number] Patient Account Number: [id number] Date of Birth/Sex: Treating RN: November 10, 1960 (62 y.o. [name] Primary Care Maitri Schnoebelen: Enid Baas Other Clinician: Referring Elise Knobloch: Treating Jaeshawn Silvio/Extender: Rica Mote Weeks in Treatment: 26 Wound Status Wound  Number: 3 Primary Etiology: Pressure Ulcer Wound Location: Proximal, Midline Sacrum Wound Status: Open Wounding Event: Pressure Injury Comorbid History: Angina, Hypotension Date Acquired: 03/09/2022 Weeks Of Treatment: 26 Clustered Wound: No Photos Wound Measurements Length: (cm) 2.6 Width: (cm) 1.2 Depth: (cm) 2 Area: (cm) 2.45 Volume: (cm) 4.901 % Reduction in Area: 89.6% % Reduction in Volume: 96.5% Epithelialization: None Undermining: Yes Starting Position (o'clock): 8 Ending Position (o'clock): 7 Maximum Distance: (cm) 3.2 Wound Description [name], [name] ([id number]) Classification: Category/Stage IV Wound Margin: Epibole Exudate Amount: Large Exudate Type: Serosanguineous Exudate Color: red, brown [id number] Page 9 of 10 Foul Odor After Cleansing: No Slough/Fibrino Yes Wound Bed Granulation Amount: Large (67-100%) Exposed Structure Granulation Quality: Red, Hyper-granulation Fascia Exposed: No Necrotic Amount: Small (1-33%) Fat Layer (Subcutaneous Tissue) Exposed: Yes Tendon Exposed: No Muscle Exposed: Yes Necrosis of Muscle: No Joint Exposed: No Bone Exposed: No Treatment Notes Wound #3 (Sacrum) Wound Laterality: Midline, Proximal Cleanser Vashe 5.8 (oz) Discharge Instruction: Use vashe 5.8 (oz) as directed Peri-Wound Care Topical Primary Dressing Secondary Dressing Secured With Compression Wrap Compression Stockings Add-Ons Wound Vac Electronic Signature(s) Signed: 11/05/2022 4:43:06 PM By: Midge Aver MSN RN CNS WTA Entered By: Midge Aver on 11/04/2022 13:11:45 -------------------------------------------------------------------------------- Vitals Details Patient Name: Date of Service: [name], [name] 11/04/2022 12:30 PM Medical Record Number: [id number] Patient Account Number: [id number] Date of Birth/Sex: Treating RN: Apr 24, 1960 (62 y.o. [name] Primary Care Marckus Hanover: Enid Baas Other Clinician: Referring Syana Degraffenreid: Treating Daelyn Pettaway/Extender: Rica Mote Weeks in Treatment: 26 Vital Signs Time Taken: 12:50 Temperature (F): 98.2 Pulse (bpm): 127 Respiratory Rate (breaths/min): 18 Blood Pressure (mmHg): 104/70 Reference Range: 80 - 120 mg / dl Electronic Signature(s) Gust Rung, [name] ([id number])  [id number] Page 10 of 10 Signed: 11/04/2022 2:32:03 PM By: Midge Aver MSN RN CNS WTA Previous Signature: 11/04/2022 2:24:22 PM Version By: Midge Aver MSN RN CNS WTA Entered By: Midge Aver on 11/04/2022 14:32:03

## 2022-11-10 ENCOUNTER — Inpatient Hospital Stay: Payer: Medicare Other

## 2022-11-18 ENCOUNTER — Other Ambulatory Visit: Payer: Self-pay | Admitting: Internal Medicine

## 2022-11-18 ENCOUNTER — Encounter: Payer: Medicare Other | Attending: Internal Medicine | Admitting: Internal Medicine

## 2022-11-18 DIAGNOSIS — Z7901 Long term (current) use of anticoagulants: Secondary | ICD-10-CM | POA: Diagnosis not present

## 2022-11-18 DIAGNOSIS — L89154 Pressure ulcer of sacral region, stage 4: Secondary | ICD-10-CM | POA: Diagnosis present

## 2022-11-18 DIAGNOSIS — Z86711 Personal history of pulmonary embolism: Secondary | ICD-10-CM | POA: Diagnosis not present

## 2022-11-18 DIAGNOSIS — R0602 Shortness of breath: Secondary | ICD-10-CM

## 2022-11-18 DIAGNOSIS — Z86718 Personal history of other venous thrombosis and embolism: Secondary | ICD-10-CM | POA: Insufficient documentation

## 2022-11-18 DIAGNOSIS — M4628 Osteomyelitis of vertebra, sacral and sacrococcygeal region: Secondary | ICD-10-CM | POA: Insufficient documentation

## 2022-11-18 DIAGNOSIS — G35 Multiple sclerosis: Secondary | ICD-10-CM | POA: Insufficient documentation

## 2022-11-18 DIAGNOSIS — J984 Other disorders of lung: Secondary | ICD-10-CM

## 2022-11-20 NOTE — Progress Notes (Signed)
[name], [name] ([id number]) [id number] Page 1 of 9 Visit Report for 11/18/2022 Arrival Information Details Patient Name: Date of Service: [name] ND, [name] 11/18/2022 12:30 PM Medical Record Number: [id number] Patient Account Number: [id number] Date of Birth/Sex: Treating RN: March 30, 1961 (62 y.o. [name] Primary Care : Enid Baas Other Clinician: Referring : Treating /Extender: Rica Mote Weeks in Treatment: 28 Visit Information History Since Last Visit Added or deleted any medications: No Patient Arrived: Wheel Chair Any new allergies or adverse reactions: No Arrival Time: 12:29 Had a fall or experienced change in No Accompanied By: boyfriend activities of daily living that may affect Transfer Assistance: Michiel Sites Lift risk of falls: Patient Identification Verified: Yes Signs or symptoms of abuse/neglect since last visito No Secondary Verification Process Completed: Yes Hospitalized since last visit: No Patient Requires Transmission-Based Precautions: No Has Dressing in Place as Prescribed: Yes Patient Has Alerts: Yes Pain Present Now: No Patient Alerts: Patient on Blood Thinner Not Diabetic Xarelto Electronic Signature(s) Signed: 11/20/2022 12:50:44 PM By: Midge Aver MSN RN CNS WTA Entered By: Midge Aver on 11/18/2022 12:30:17 -------------------------------------------------------------------------------- Clinic Level of Care Assessment Details Patient Name: Date of Service: [name] ND, [name] 11/18/2022 12:30 PM Medical Record Number: [id number] Patient Account Number: [id number] Date of Birth/Sex: Treating RN: [birth year] (62 y.o. [name] Primary Care : Enid Baas Other Clinician: Referring : Treating /Extender: Rica Mote Weeks in Treatment: 28 Clinic Level of Care Assessment Items TOOL 4  Quantity Score X- 1 0 Use when only an EandM is performed on FOLLOW-UP visit ASSESSMENTS - Nursing Assessment / Reassessment X- 1 10 Reassessment of Co-morbidities (includes updates in patient status) X- 1 5 Reassessment of Adherence to Treatment Plan ASSESSMENTS - Wound and Skin A ssessment / Reassessment X - Simple Wound Assessment / Reassessment - one wound 1 5 Borak, Yumalay ([id number]) [phone].pdf Page 2 of 9 []  - 0 Complex Wound Assessment / Reassessment - multiple wounds []  - 0 Dermatologic / Skin Assessment (not related to wound area) ASSESSMENTS - Focused Assessment []  - 0 Circumferential Edema Measurements - multi extremities []  - 0 Nutritional Assessment / Counseling / Intervention []  - 0 Lower Extremity Assessment (monofilament, tuning fork, pulses) []  - 0 Peripheral Arterial Disease Assessment (using hand held doppler) ASSESSMENTS - Ostomy and/or Continence Assessment and Care []  - 0 Incontinence Assessment and Management []  - 0 Ostomy Care Assessment and Management (repouching, etc.) PROCESS - Coordination of Care X - Simple Patient / Family Education for ongoing care 1 15 []  - 0 Complex (extensive) Patient / Family Education for ongoing care X- 1 10 Staff obtains Chiropractor, Records, T Results / Process Orders est []  - 0 Staff telephones HHA, Nursing Homes / Clarify orders / etc []  - 0 Routine Transfer to another Facility (non-emergent condition) []  - 0 Routine Hospital Admission (non-emergent condition) []  - 0 New Admissions / Manufacturing engineer / Ordering NPWT Apligraf, etc. , []  - 0 Emergency Hospital Admission (emergent condition) X- 1 10 Simple Discharge Coordination []  - 0 Complex (extensive) Discharge Coordination PROCESS - Special Needs []  - 0 Pediatric / Minor Patient Management []  - 0 Isolation Patient Management []  - 0 Hearing / Language / Visual special needs []  - 0 Assessment of Community  assistance (transportation, D/C planning, etc.) []  - 0 Additional assistance / Altered mentation []  - 0 Support Surface(s) Assessment (bed, cushion, seat, etc.) INTERVENTIONS - Wound Cleansing / Measurement X - Simple Wound Cleansing - one  wound 1 5 []  - 0 Complex Wound Cleansing - multiple wounds X- 1 5 Wound Imaging (photographs - any number of wounds) []  - 0 Wound Tracing (instead of photographs) X- 1 5 Simple Wound Measurement - one wound []  - 0 Complex Wound Measurement - multiple wounds INTERVENTIONS - Wound Dressings X - Small Wound Dressing one or multiple wounds 1 10 []  - 0 Medium Wound Dressing one or multiple wounds []  - 0 Large Wound Dressing one or multiple wounds []  - 0 Application of Medications - topical []  - 0 Application of Medications - injection INTERVENTIONS - Miscellaneous []  - 0 External ear exam []  - 0 Specimen Collection (cultures, biopsies, blood, body fluids, etc.) YASHASVI, MANKA ([id number]) [phone].pdf Page 3 of 9 []  - 0 Specimen(s) / Culture(s) sent or taken to Lab for analysis []  - 0 Patient Transfer (multiple staff / Nurse, adult / Similar devices) []  - 0 Simple Staple / Suture removal (25 or less) []  - 0 Complex Staple / Suture removal (26 or more) []  - 0 Hypo / Hyperglycemic Management (close monitor of Blood Glucose) []  - 0 Ankle / Brachial Index (ABI) - do not check if billed separately X- 1 5 Vital Signs Has the patient been seen at the hospital within the last three years: Yes Total Score: 85 Level Of Care: New/Established - Level 3 Electronic Signature(s) Signed: 11/20/2022 12:50:44 PM By: Midge Aver MSN RN CNS WTA Entered By: Midge Aver on 11/18/2022 13:10:27 -------------------------------------------------------------------------------- Encounter Discharge Information Details Patient Name: Date of Service: [name] ND, [name] 11/18/2022 12:30 PM Medical Record Number:  [id number] Patient Account Number: [id number] Date of Birth/Sex: Treating RN: [birth year] (62 y.o. [name] Primary Care : Enid Baas Other Clinician: Referring : Treating /Extender: Rica Mote Weeks in Treatment: 47 Encounter Discharge Information Items Discharge Condition: Stable Ambulatory Status: Wheelchair Discharge Destination: Home Transportation: Private Auto Accompanied By: boyfriend Schedule Follow-up Appointment: Yes Clinical Summary of Care: Electronic Signature(s) Signed: 11/20/2022 12:50:44 PM By: Midge Aver MSN RN CNS WTA Entered By: Midge Aver on 11/18/2022 13:30:06 -------------------------------------------------------------------------------- Lower Extremity Assessment Details Patient Name: Date of Service: [name] ND, [name] 11/18/2022 12:30 PM Medical Record Number: [id number] Patient Account Number: [id number] Date of Birth/Sex: Treating RN: Jan 04, 1961 (62 y.o. Arisha, Noorda, Lyriq ([id number]) [phone].pdf Page 4 of 9 Primary Care : Enid Baas Other Clinician: Referring : Treating /Extender: Rica Mote Weeks in Treatment: 28 Electronic Signature(s) Signed: 11/20/2022 12:50:44 PM By: Midge Aver MSN RN CNS WTA Entered By: Midge Aver on 11/18/2022 12:59:20 -------------------------------------------------------------------------------- Multi Wound Chart Details Patient Name: Date of Service: [name] ND, [name] 11/18/2022 12:30 PM Medical Record Number: [id number] Patient Account Number: [id number] Date of Birth/Sex: Treating RN: 1960/05/10 (62 y.o. [name] Primary Care : Enid Baas Other Clinician: Referring : Treating /Extender: Rica Mote Weeks in Treatment: 28 Vital Signs Height(in): Pulse(bpm):  97 Weight(lbs): Blood Pressure(mmHg): 99/62 Body Mass Index(BMI): Temperature(F): 98.3 Respiratory Rate(breaths/min): 18 [3:Photos:] [N/A:N/A] Proximal, Midline Sacrum N/A N/A Wound Location: Pressure Injury N/A N/A Wounding Event: Pressure Ulcer N/A N/A Primary Etiology: Angina, Hypotension N/A N/A Comorbid History: 03/09/2022 N/A N/A Date Acquired: 60 N/A N/A Weeks of Treatment: Open N/A N/A Wound Status: No N/A N/A Wound Recurrence: 2x0.7x0.7 N/A N/A Measurements L x W x D (cm) 1.1 N/A N/A A (cm) : rea 0.77 N/A N/A Volume (cm) : 95.30% N/A N/A % Reduction in A rea: 99.50%  N/A N/A % Reduction in Volume: 8 Starting Position 1 (o'clock): 9 Ending Position 1 (o'clock): 1.9 Maximum Distance 1 (cm): Yes N/A N/A Undermining: Category/Stage IV N/A N/A Classification: Large N/A N/A Exudate A mount: Serosanguineous N/A N/A Exudate Type: red, brown N/A N/A Exudate Color: Epibole N/A N/A Wound Margin: Large (67-100%) N/A N/A Granulation A mount: Red, Hyper-granulation N/A N/A Granulation Quality: Small (1-33%) N/A N/A Necrotic A mount: Fat Layer (Subcutaneous Tissue): Yes N/A N/A Exposed Structures: Muscle: Yes Fascia: No MARYCARMEN, BLYTH ([id number]) [id number] Page 5 of 9 Tendon: No Joint: No Bone: No None N/A N/A Epithelialization: Treatment Notes Electronic Signature(s) Signed: 11/20/2022 12:50:44 PM By: Midge Aver MSN RN CNS WTA Entered By: Midge Aver on 11/18/2022 13:08:37 -------------------------------------------------------------------------------- Multi-Disciplinary Care Plan Details Patient Name: Date of Service: [name] ND, [name] 11/18/2022 12:30 PM Medical Record Number: [id number] Patient Account Number: [id number] Date of Birth/Sex: Treating RN: 09-18-1960 (62 y.o. [name] Primary Care : Enid Baas Other Clinician: Referring : Treating /Extender:  Rica Mote Weeks in Treatment: 28 Active Inactive Wound/Skin Impairment Nursing Diagnoses: Impaired tissue integrity Knowledge deficit related to ulceration/compromised skin integrity Goals: Patient/caregiver will verbalize understanding of skin care regimen Date Initiated: 05/06/2022 Date Inactivated: 07/29/2022 Target Resolution Date: 08/28/2022 Goal Status: Met Ulcer/skin breakdown will have a volume reduction of 30% by week 4 Date Initiated: 05/06/2022 Date Inactivated: 07/29/2022 Target Resolution Date: 06/06/2022 Goal Status: Unmet Unmet Reason: not 30% decrease Ulcer/skin breakdown will have a volume reduction of 50% by week 8 Date Initiated: 05/20/2022 Date Inactivated: 07/29/2022 Target Resolution Date: 07/05/2022 Unmet Reason: not 50% decrease in Goal Status: Unmet size Ulcer/skin breakdown will have a volume reduction of 80% by week 12 Date Initiated: 05/20/2022 Date Inactivated: 07/29/2022 Target Resolution Date: 08/05/2022 Unmet Reason: not 80% decrease in Goal Status: Unmet size Ulcer/skin breakdown will heal within 14 weeks Date Initiated: 05/20/2022 Target Resolution Date: 12/12/2022 Goal Status: Active Interventions: Assess patient/caregiver ability to perform ulcer/skin care regimen upon admission and as needed Assess ulceration(s) every visit Provide education on ulcer and skin care Screen for HBO Treatment Activities: Skin care regimen initiated : 05/06/2022 Topical wound management initiated : 05/06/2022 Notes: TAMICHA, TOWSLEY ([id number]) [phone].pdf Page 6 of 9 Negative Pressure Wound Therapy Nursing Diagnoses: Knowledge deficit related to use and safety of the device Impaired tissue integrity Goals: Patient/caregiver agrees to and verbalizes understanding of need to use Date Initiated: 09/14/2022 Date Inactivated: 10/07/2022 Target Resolution Date: 10/14/2022 Goal Status: Met Patient/caregiver will  verbalize understanding of use of the device Date Initiated: 09/14/2022 Date Inactivated: 10/21/2022 Target Resolution Date: 10/14/2022 Goal Status: Met Promote granulation tissue Date Initiated: 09/14/2022 Date Inactivated: 11/18/2022 Target Resolution Date: 11/04/2022 Goal Status: Met Reduce inflammatory exudate Date Initiated: 09/14/2022 Target Resolution Date: 10/31/2022 Goal Status: Active Interventions: Assess drainage (amount and color) Assess patient nutrition upon admission and as needed per policy Assess patient understanding of disease process and management Assess patient/caregiver ability to obtain necessary supplies Assess wound bed for efficacy of treatment Monitor and protect skin around the wound Provide education on nutrition Provide education on use, care, and troubleshooting Treatment Activities: Incontinence or moisture care in place (i.e., Barrier creams, foley catheter, colostomy, diapers, etc.) : 09/02/2022 Patient referred to home health care : 09/02/2022 Referred to DME  for dressing supplies : 09/02/2022 Support surface (Group 2/3) in use : 09/02/2022 Turning and Repositioning schedule in place : 09/02/2022 Notes: Electronic Signature(s) Signed: 11/20/2022 12:50:44 PM By: Midge Aver MSN RN CNS WTA  Entered By: Midge Aver on 11/18/2022 13:10:53 -------------------------------------------------------------------------------- Pain Assessment Details Patient Name: Date of Service: [name] ND, [name] 11/18/2022 12:30 PM Medical Record Number: [id number] Patient Account Number: [id number] Date of Birth/Sex: Treating RN: 07-Nov-1960 (62 y.o. [name] Primary Care : Enid Baas Other Clinician: Referring : Treating /Extender: Rica Mote Weeks in Treatment: 28 Active Problems Location of Pain Severity and Description of Pain Patient Has Paino No Site Locations [place], [place]  ([id number]) [id number] Page 7 of 9 Pain Management and Medication Current Pain Management: Electronic Signature(s) Signed: 11/20/2022 12:50:44 PM By: Midge Aver MSN RN CNS WTA Entered By: Midge Aver on 11/18/2022 12:46:24 -------------------------------------------------------------------------------- Patient/Caregiver Education Details Patient Name: Date of Service: [name] ND, [name] 8/7/2024andnbsp12:30 PM Medical Record Number: [id number] Patient Account Number: [id number] Date of Birth/Gender: Treating RN: 1960-08-19 (62 y.o. [name] Primary Care Physician: Enid Baas Other Clinician: Referring Physician: Treating Physician/Extender: Rica Mote Weeks in Treatment: 70 Education Assessment Education Provided To: Patient Education Topics Provided Wound/Skin Impairment: Handouts: Caring for Your Ulcer Methods: Explain/Verbal Responses: State content correctly Electronic Signature(s) Signed: 11/20/2022 12:50:44 PM By: Midge Aver MSN RN CNS WTA Entered By: Midge Aver on 11/18/2022 13:29:16 [name] ([id number]) [id number] Page 8 of 9 -------------------------------------------------------------------------------- Wound Assessment Details Patient Name: Date of Service: [name] ND, [name] 11/18/2022 12:30 PM Medical Record Number: [id number] Patient Account Number: [id number] Date of Birth/Sex: Treating RN: 25-Oct-1960 (61 y.o. [name] Primary Care : Enid Baas Other Clinician: Referring : Treating /Extender: Rica Mote Weeks in Treatment: 28 Wound Status Wound Number: 3 Primary Etiology: Pressure Ulcer Wound Location: Proximal, Midline Sacrum Wound Status: Open Wounding Event: Pressure Injury Comorbid History: Angina, Hypotension Date Acquired: 03/09/2022 Weeks Of Treatment:  28 Clustered Wound: No Photos Wound Measurements Length: (cm) 2 Width: (cm) 0.7 Depth: (cm) 0.7 Area: (cm) 1.1 Volume: (cm) 0.7 % Reduction in Area: 95.3% % Reduction in Volume: 99.5% Epithelialization: None Undermining: Yes 7 Starting Position (o'clock): 8 Ending Position (o'clock): 9 Maximum Distance: (cm) 1.9 Wound Description Classification: Category/Stage IV Wound Margin: Epibole Exudate Amount: Large Exudate Type: Serosanguineous Exudate Color: red, brown Foul Odor After Cleansing: No Slough/Fibrino Yes Wound Bed Granulation Amount: Large (67-100%) Exposed Structure Granulation Quality: Red, Hyper-granulation Fascia Exposed: No Necrotic Amount: Small (1-33%) Fat Layer (Subcutaneous Tissue) Exposed: Yes Tendon Exposed: No Muscle Exposed: Yes Necrosis of Muscle: No Joint Exposed: No Bone Exposed: No Treatment Notes Wound #3 (Sacrum) Wound Laterality: Midline, Proximal [name] ([id number]) [phone].pdf Page 9 of 9 Cleanser Vashe 5.8 (oz) Discharge Instruction: Use vashe 5.8 (oz) as directed Peri-Wound Care Topical Primary Dressing Secondary Dressing Secured With Compression Wrap Compression Stockings Add-Ons Wound Vac Electronic Signature(s) Signed: 11/20/2022 12:50:44 PM By: Midge Aver MSN RN CNS WTA Entered By: Midge Aver on 11/18/2022 12:59:07 -------------------------------------------------------------------------------- Vitals Details Patient Name: Date of Service: [name] ND, [name] 11/18/2022 12:30 PM Medical Record Number: [id number] Patient Account Number: [id number] Date of Birth/Sex: Treating RN: 1961-02-09 (62 y.o. [name] Primary Care : Enid Baas Other Clinician: Referring : Treating /Extender: Rica Mote Weeks in Treatment: 28 Vital Signs Time Taken: 12:45 Temperature (F): 98.3 Pulse (bpm): 97 Respiratory Rate  (breaths/min): 18 Blood Pressure (mmHg): 99/62 Reference Range: 80 - 120 mg / dl Electronic Signature(s) Signed: 11/20/2022 12:50:44 PM By: Midge Aver MSN RN CNS WTA Entered By: Midge Aver on 11/18/2022 12:46:15

## 2022-11-20 NOTE — Progress Notes (Signed)
LELAR, GRYGIEL ([id number]) [id number] Page 1 of 8 Visit Report for 11/18/2022 Chief Complaint Document Details Patient Name: Date of Service: [name] STRA [name], [name] 11/18/2022 12:30 PM Medical Record Number: [id number] Patient Account Number: [id number] Date of Birth/Sex: Treating RN: 04/17/1960 (62 y.o. [name] Primary Care Provider: Enid Baas Other Clinician: Referring Provider: Treating Provider/Extender: Rica Mote Weeks in Treatment: 28 Information Obtained from: Patient Chief Complaint 05/06/2022; Bilateral feet wounds and sacral wounds Electronic Signature(s) Signed: 11/18/2022 2:10:16 PM By: Geralyn Corwin DO Entered By: Geralyn Corwin on 11/18/2022 13:12:49 -------------------------------------------------------------------------------- HPI Details Patient Name: Date of Service: [name], [name] 11/18/2022 12:30 PM Medical Record Number: [id number] Patient Account Number: [id number] Date of Birth/Sex: Treating RN: [birth year] (62 y.o. [name] Primary Care Provider: Enid Baas Other Clinician: Referring Provider: Treating Provider/Extender: Rica Mote Weeks in Treatment: 28 History of Present Illness HPI Description: 05/06/2022 [name] is a 62 year old female with a past medical history of multiple sclerosis and PEs and DVT that presents to the clinic for sacral s wounds and Bilateral feet wounds. Patient states that she obtained the sacral ulcers when she was hospitalized in November 2023 for bilateral pulmonary embolisms. She was in the ICU on a ventilator. Since discharge she has been using Dakin's wet-to-dry dressings to the area. Since her hospitalization she has been bedbound. However she is doing physical therapy. Prior to being hospitalized she was able to pull herself up and transfer from bed to chair on her own. Due to  her MS she is not fully mobile. Unfortunately she states that the wound has gotten larger on her sacrum despite Wound care. Patient has home health. On 04/16/2022 she was admitted to the hospital for severe sepsis secondary to acute UTI and sacral cellulitis. She completed 5 days of IV cefepime and vancomycin. She was not discharged with oral antibiotics. She states she developed the bilateral feet wounds at that time. She states she has bunny boots however she is not wearing them. She currently denies systemic signs of infection. 2/7; patient presents for follow-up. She had a bone biopsy done at last clinic visit that showed fragments of inflamed granulation tissue, necrotic material and bone with acute osteomyelitis. Culture grew Proteus mirabilis and Klebsiella pneumoniae. Patient is scheduled to see infectious disease next week. For now she has been doing Dakin's wet-to-dry dressings to the sacrum. She is not able to obtain Santyl and has been keeping the left heel covered. T the right foot where o there was a previous wound with scab however this has healed. This has remained closed. No open wound noted. She states over the past week she has had chills but no fevers, nausea/vomiting or purulent drainage. 2/21; patient presents for follow-up. She saw Dr. Joylene Draft on 2/15. 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 Today she had feces XIAO, KAUSHIK ([id number]) [id number] Page 2 of 8 impacted in the wound and throughout the tunneling. We discussed a diverting colostomy to address this issue. She was agreeable with a referral to general surgery. She currently denies systemic signs of infection. She has been using Medihoney and Hydrofera Blue to the left heel wound. She has been using her Prevalon boots. 3/20; patient presents for follow-up. She has been using Dakin's wet-to-dry dressings to the sacral wound. She has developed a new wound to  her right lateral heel. She is using Medihoney and Hydrofera Blue to the left heel. She is  currently taking Levaquin to complete 6 weeks. She follows with infectious disease for this. She saw general surgery at [facility] to discuss potentially doing a diverting colostomy however per patient's surgeon did not recommend this. 4/17; patient presents for follow-up. Has been using Dakin's wet-to-dry dressings to the sacral wound. She has been using Medihoney and Hydrofera Blue to the heel wounds. She states she is using her bunny boots for offloading the heels. She has not heard from plastic surgery for consultation. We gave her the number to call to follow this up. 5/22; patient presents for follow-up. She saw Dr. Ferd Hibbs on 5/8, plastic surgery to discuss potentially doing surgical debridement with muscle flap. Per Dr. Timothy Lasso note Review of the surgery and requirements were discussed with the patient. Patient would like to hold off on proceeding with this option. Wound VAC was recommended and based on the wound today this option seemed reasonable to get started. Patient has home health that will be able to change the wound VAC. She has been using Dakin's wet-to-dry dressings to the sacrum. Patient has a new wound to the left medial ankle. She states she wears bunny boots but does not while in the wheelchair. 6/1; the patient has started the wound VAC as of Monday to the sacral area. Home health is out to change the dressing. She also has wounds on her bilateral ankles and left heel she is using Medihoney and Hydrofera Blue to these areas. She has advanced MS with contractures. Both the patient and her husband say she is eating a high-protein dilated well. She has some form of air mattress but the bed is from sleep number. I am uncertain about the offloading part of this 6/12; patient presents for follow-up. She has had a wound VAC to the sacral wound and home health has been changing this. She has had no  issues with this. She has been using Hydrofera Blue and Medihoney to the bilateral ankle and left heel wounds. The right ankle wound is healed. 6/26; patient presents for follow-up. Has been using a wound VAC to the sacral wound. She has no issues with this. Wound is smaller. She had ABIs on the right that were 1.27 and on the left that was 1.35 with a TBI of 1.18. She had triphasic waveforms throughout. She is been using Hydrofera Blue and Medihoney to the left heel wounds. These have closed. She denies signs of infection. 7/10; patient presents for follow-up. She continues to use a wound VAC to the sacral wound. She has slight skin irritation to the periwound from the drape. Overall wound appears healing. She has no other open wounds to her lower extremities. She currently denies systemic signs of infection. 7/24; patient presents for follow-up. She continues to use the wound VAC to the sacral wound. There is no issues or complaints. 8/7; patient presents for follow-up. She has been using the wound VAC with benefit. The wound is smaller. She is has no issues or complaints today. Home health has been coming out and changing the wound VAC 3x A week. Electronic Signature(s) Signed: 11/18/2022 2:10:16 PM By: Geralyn Corwin DO Entered By: Geralyn Corwin on 11/18/2022 13:13:34 -------------------------------------------------------------------------------- Physical Exam Details Patient Name: Date of Service: [name], [name] 11/18/2022 12:30 PM Medical Record Number: [id number] Patient Account Number: [id number] Date of Birth/Sex: Treating RN: [birth year] (62 y.o. [name] Primary Care Provider: Enid Baas Other Clinician: Referring Provider: Treating Provider/Extender: Rica Mote Weeks in Treatment: 28 Constitutional .  Psychiatric . Notes T the sacrum there is granulation tissue throughout although not the healthiest in appearance however no  probing to bone. No signs of infection. Minimal o undermining almost circumferentially. Electronic Signature(s) Signed: 11/18/2022 2:10:16 PM By: Geralyn Corwin DO Entered By: Geralyn Corwin on 11/18/2022 13:14:18 [name] ([id number]) [id number] Page 3 of 8 -------------------------------------------------------------------------------- Physician Orders Details Patient Name: Date of Service: [name], [name] 11/18/2022 12:30 PM Medical Record Number: [id number] Patient Account Number: [id number] Date of Birth/Sex: Treating RN: 20-Aug-1960 (62 y.o. [name] Primary Care Provider: Enid Baas Other Clinician: Referring Provider: Treating Provider/Extender: Rica Mote Weeks in Treatment: 23 Verbal / Phone Orders: No Diagnosis Coding Home Health CONTINUE Home Health for wound care. May utilize formulary equivalent dressing for wound treatment orders unless otherwise specified. Home Health Nurse may visit PRN to address patients wound care needs. - ADORATION May apply Duoderm under the drape to help protect the skin **Please direct any NON-WOUND related issues/requests for orders to patient's Primary Care Physician. **If current dressing causes regression in wound condition, may D/C ordered dressing product/s and apply Normal Saline Moist Dressing daily until next Wound Healing Center or Other MD appointment. **Notify Wound Healing Center of regression in wound condition at [phone]. Negative Pressure Wound Therapy Wound #3 Proximal,Midline Sacrum Wound VAC settings at continuous pressure. Use foam to wound cavity. Please order WHITE foam to fill any tunnel/s and/or undermining when necessary. Change VAC dressing 3 X WEEK. Change canister as indicated when full. - white foam in tunnels followed by black foam , Wound vac to be applied by home health nurse for Adoration, when patient is seen  in clinic wound vac will be removed and replaced with wet to dry dressing , home health to reapply wound vac that same day . Home Health Nurse may d/c VAC for s/s of increased infection, significant wound regression, or uncontrolled drainage. Notify Wound Healing Center at [phone]. - ADORATION Wound Treatment Wound #3 - Sacrum Wound Laterality: Midline, Proximal Cleanser: Vashe 5.8 (oz) 3 x Per Week/30 Days Discharge Instructions: Use vashe 5.8 (oz) as directed Add-Ons: Wound Vac 3 x Per Week/30 Days Electronic Signature(s) Signed: 11/18/2022 2:10:16 PM By: Geralyn Corwin DO Entered By: Geralyn Corwin on 11/18/2022 13:16:34 -------------------------------------------------------------------------------- Problem List Details Patient Name: Date of Service: [name], [name] 11/18/2022 12:30 PM Medical Record Number: [id number] Patient Account Number: [id number] Date of Birth/Sex: Treating RN: 1960-11-17 (62 y.o. [name] Primary Care Provider: Enid Baas Other Clinician: Referring Provider: Treating Provider/Extender: Rica Mote Weeks in Treatment: [address], [place] ([id number]) [id number] Page 4 of 8 Active Problems ICD-10 Encounter Code Description Active Date MDM Diagnosis L89.154 Pressure ulcer of sacral region, stage 4 05/06/2022 No Yes I26.99 Other pulmonary embolism without acute cor pulmonale 05/06/2022 No Yes 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, sacral and sacrococcygeal region 05/20/2022 No Yes G35 Multiple sclerosis 05/06/2022 No Yes I82.409 Acute embolism and thrombosis of unspecified deep veins of unspecified 05/06/2022 No Yes lower extremity Z79.01 Long term (current) use of anticoagulants 05/06/2022 No Yes Inactive Problems ICD-10 Code Description Active Date Inactive Date L89.153 Pressure ulcer of sacral region, stage 3 05/06/2022 05/06/2022 Resolved Problems ICD-10 Code Description  Active Date Resolved Date L89.620 Pressure ulcer of left heel, unstageable 05/06/2022 05/06/2022 L89.510 Pressure ulcer of right ankle, unstageable 09/02/2022 09/02/2022 L89.523 Pressure ulcer of left ankle, stage 3 09/02/2022 09/02/2022 Electronic Signature(s) Signed: 11/18/2022 2:10:16 PM By: Geralyn Corwin DO Entered By:  Geralyn Corwin on 11/18/2022 13:12:44 Progress Note Details -------------------------------------------------------------------------------- [name] ([id number]) [id number] Page 5 of 8 Patient Name: Date of Service: [name], [name] 11/18/2022 12:30 PM Medical Record Number: [id number] Patient Account Number: [id number] Date of Birth/Sex: Treating RN: July 07, 1960 (62 y.o. [name] Primary Care Provider: Enid Baas Other Clinician: Referring Provider: Treating Provider/Extender: Rica Mote Weeks in Treatment: 77 Subjective Chief Complaint Information obtained from Patient 05/06/2022; Bilateral feet wounds and sacral wounds History of Present Illness (HPI) 05/06/2022 [name] is a 62 year old female with a past medical history of multiple sclerosis and PEs and DVT that presents to the clinic for sacral s wounds and Bilateral feet wounds. Patient states that she obtained the sacral ulcers when she was hospitalized in November 2023 for bilateral pulmonary embolisms. She was in the ICU on a ventilator. Since discharge she has been using Dakin's wet-to-dry dressings to the area. Since her hospitalization she has been bedbound. However she is doing physical therapy. Prior to being hospitalized she was able to pull herself up and transfer from bed to chair on her own. Due to her MS she is not fully mobile. Unfortunately she states that the wound has gotten larger on her sacrum despite Wound care. Patient has home health. On 04/16/2022 she was admitted to the hospital for  severe sepsis secondary to acute UTI and sacral cellulitis. She completed 5 days of IV cefepime and vancomycin. She was not discharged with oral antibiotics. She states she developed the bilateral feet wounds at that time. She states she has bunny boots however she is not wearing them. She currently denies systemic signs of infection. 2/7; patient presents for follow-up. She had a bone biopsy done at last clinic visit that showed fragments of inflamed granulation tissue, necrotic material and bone with acute osteomyelitis. Culture grew Proteus mirabilis and Klebsiella pneumoniae. Patient is scheduled to see infectious disease next week. For now she has been doing Dakin's wet-to-dry dressings to the sacrum. She is not able to obtain Santyl and has been keeping the left heel covered. T the right foot where o there was a previous wound with scab however this has healed. This has remained closed. No open wound noted. She states over the past week she has had chills but no fevers, nausea/vomiting or purulent drainage. 2/21; patient presents for follow-up. She saw Dr. Joylene Draft on 2/15. 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 Today she had feces impacted in the wound and throughout the tunneling. We discussed a diverting colostomy to address this issue. She was agreeable with a referral to general surgery. She currently denies systemic signs of infection. She has been using Medihoney and Hydrofera Blue to the left heel wound. She has been using her Prevalon boots. 3/20; patient presents for follow-up. She has been using Dakin's wet-to-dry dressings to the sacral wound. She has developed a new wound to her right lateral heel. She is using Medihoney and Hydrofera Blue to the left heel. She is currently taking Levaquin to complete 6 weeks. She follows with infectious disease for this. She saw general surgery at [facility] to discuss potentially doing a diverting colostomy however per  patient's surgeon did not recommend this. 4/17; patient presents for follow-up. Has been using Dakin's wet-to-dry dressings to the sacral wound. She has been using Medihoney and Hydrofera Blue to the heel wounds. She states she is using her bunny boots for offloading the heels. She has not heard from plastic  surgery for consultation. We gave her the number to call to follow this up. 5/22; patient presents for follow-up. She saw Dr. Ferd Hibbs on 5/8, plastic surgery to discuss potentially doing surgical debridement with muscle flap. Per Dr. Timothy Lasso note Review of the surgery and requirements were discussed with the patient. Patient would like to hold off on proceeding with this option. Wound VAC was recommended and based on the wound today this option seemed reasonable to get started. Patient has home health that will be able to change the wound VAC. She has been using Dakin's wet-to-dry dressings to the sacrum. Patient has a new wound to the left medial ankle. She states she wears bunny boots but does not while in the wheelchair. 6/1; the patient has started the wound VAC as of Monday to the sacral area. Home health is out to change the dressing. She also has wounds on her bilateral ankles and left heel she is using Medihoney and Hydrofera Blue to these areas. She has advanced MS with contractures. Both the patient and her husband say she is eating a high-protein dilated well. She has some form of air mattress but the bed is from sleep number. I am uncertain about the offloading part of this 6/12; patient presents for follow-up. She has had a wound VAC to the sacral wound and home health has been changing this. She has had no issues with this. She has been using Hydrofera Blue and Medihoney to the bilateral ankle and left heel wounds. The right ankle wound is healed. 6/26; patient presents for follow-up. Has been using a wound VAC to the sacral wound. She has no issues with this. Wound is smaller. She  had ABIs on the right that were 1.27 and on the left that was 1.35 with a TBI of 1.18. She had triphasic waveforms throughout. She is been using Hydrofera Blue and Medihoney to the left heel wounds. These have closed. She denies signs of infection. 7/10; patient presents for follow-up. She continues to use a wound VAC to the sacral wound. She has slight skin irritation to the periwound from the drape. Overall wound appears healing. She has no other open wounds to her lower extremities. She currently denies systemic signs of infection. 7/24; patient presents for follow-up. She continues to use the wound VAC to the sacral wound. There is no issues or complaints. 8/7; patient presents for follow-up. She has been using the wound VAC with benefit. The wound is smaller. She is has no issues or complaints today. Home health has been coming out and changing the wound VAC 3x A week. Objective Constitutional Vitals Time Taken: 12:45 PM, Temperature: 98.3 F, Pulse: 97 bpm, Respiratory Rate: 18 breaths/min, Blood Pressure: 99/62 mmHg. General Notes: T the sacrum there is granulation tissue throughout although not the healthiest in appearance however no probing to bone. No signs of o infection. Minimal undermining almost circumferentially. [name], [name] ([id number]) [id number] Page 6 of 8 Integumentary (Hair, Skin) Wound #3 status is Open. Original cause of wound was Pressure Injury. The date acquired was: 03/09/2022. The wound has been in treatment 28 weeks. The wound is located on the Proximal,Midline Sacrum. The wound measures 2cm length x 0.7cm width x 0.7cm depth; 1.1cm^2 area and 0.77cm^3 volume. There is muscle and Fat Layer (Subcutaneous Tissue) exposed. There is undermining starting at 8:00 and ending at 9:00 with a maximum distance of 1.9cm. There is a large amount of serosanguineous drainage noted. The wound margin is epibole. There is large (67-100%) red,  hyper  - granulation within the wound bed. There is a small (1-33%) amount of necrotic tissue within the wound bed. Assessment Active Problems ICD-10 Pressure ulcer of sacral region, stage 4 Other pulmonary embolism without acute cor pulmonale Osteomyelitis of vertebra, sacral and sacrococcygeal region Multiple sclerosis Acute embolism and thrombosis of unspecified deep veins of unspecified lower extremity Long term (current) use of anticoagulants Patient's wound has shown improvement in size since last clinic visit. I recommended continuing the wound VAC. Continue aggressive offloading. Follow-up in 2 weeks. Plan Home Health: [facility] for wound care. May utilize formulary equivalent dressing for wound treatment orders unless otherwise specified. Home Health Nurse may visit PRN to address patients wound care needs. - ADORATION May apply Duoderm under the drape to help protect the skin **Please direct any NON-WOUND related issues/requests for orders to patient's Primary Care Physician. **If current dressing causes regression in wound condition, may D/C ordered dressing product/s and apply Normal Saline Moist Dressing daily until next Wound Healing Center or Other MD appointment. **Notify Wound Healing Center of regression in wound condition at [phone]. Negative Pressure Wound Therapy: Wound #3 Proximal,Midline Sacrum: Wound VAC settings at continuous pressure. Use foam to wound cavity. Please order WHITE foam to fill any tunnel/s and/or undermining when necessary. Change VAC dressing 3 X WEEK. Change canister as indicated when full. - white foam in tunnels followed by black foam , Wound vac to be applied by home health nurse for Adoration, when patient is seen in clinic wound vac will be removed and replaced with wet to dry dressing , home health to reapply wound vac that same day . Home Health Nurse may d/c VAC for s/s of increased infection, significant wound regression,  or uncontrolled drainage. Notify Wound Healing Center at [phone]. - ADORATION WOUND #3: - Sacrum Wound Laterality: Midline, Proximal Cleanser: Vashe 5.8 (oz) 3 x Per Week/30 Days Discharge Instructions: Use vashe 5.8 (oz) as directed Add-Ons: Wound Vac 3 x Per Week/30 Days 1. Continue wound VAC 2. Use Dakin's wet-to-dry dressings when there is a break in the wound VAC 3. Follow-up in 2 weeks 4. Aggressive offloading Electronic Signature(s) Signed: 11/18/2022 2:10:16 PM By: Geralyn Corwin DO Entered By: Geralyn Corwin on 11/18/2022 13:16:11 -------------------------------------------------------------------------------- ROS/PFSH Details Patient Name: Date of Service: [name], [name] 11/18/2022 12:30 PM Medical Record Number: [id number] Patient Account Number: [id number] [name], [name] ([id number]) [id number] Page 7 of 8 Date of Birth/Sex: Treating RN: 1960/11/10 (62 y.o. [name] Primary Care Provider: Enid Baas Other Clinician: Referring Provider: Treating Provider/Extender: Rica Mote Weeks in Treatment: 28 Information Obtained From Patient Respiratory Medical History: Past Medical History Notes: pulmonary hemorrage Cardiovascular Medical History: Positive for: Angina; Hypotension Endocrine Medical History: Negative for: Type II Diabetes Integumentary (Skin) Medical History: Negative for: History of pressure wounds Neurologic Medical History: Past Medical History Notes: MS Immunizations Pneumococcal Vaccine: Received Pneumococcal Vaccination: No Implantable Devices None Family and Social History Never smoker; Marital Status - Widowed; Alcohol Use: Never; Caffeine Use: Daily Electronic Signature(s) Signed: 11/18/2022 2:10:16 PM By: Geralyn Corwin DO Signed: 11/20/2022 12:50:44 PM By: Midge Aver MSN RN CNS WTA Entered By: Geralyn Corwin on 11/18/2022  13:16:42 -------------------------------------------------------------------------------- SuperBill Details Patient Name: Date of Service: [name], [name] 11/18/2022 Medical Record Number: [id number] Patient Account Number: [id number] Date of Birth/Sex: Treating RN: [birth year] (62 y.o. [name] Primary Care Provider: Enid Baas Other Clinician: Referring Provider: Treating Provider/Extender: Rica Mote  Weeks in Treatment: [address] [name], [name] ([id number]) [id number] Page 8 of 8 ICD-10 Codes Code Description L89.154 Pressure ulcer of sacral region, stage 4 I26.99 Other pulmonary embolism without acute cor pulmonale 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, sacral and sacrococcygeal region G35 Multiple sclerosis I82.409 Acute embolism and thrombosis of unspecified deep veins of unspecified lower extremity Z79.01 Long term (current) use of anticoagulants Facility Procedures : CPT4 Code: [id number] Description: 99213 - WOUND CARE VISIT-LEV 3 EST PT Modifier: Quantity: 1 Physician Procedures : CPT4 Code Description Modifier [id number] 99213 - WC PHYS LEVEL 3 - EST PT ICD-10 Diagnosis Description L89.154 Pressure ulcer of sacral region, 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

## 2022-11-25 ENCOUNTER — Ambulatory Visit
Admission: RE | Admit: 2022-11-25 | Discharge: 2022-11-25 | Disposition: A | Discharge status: Home / Self Care | Payer: Medicare Other | Source: Ambulatory Visit | Attending: Internal Medicine | Admitting: Internal Medicine

## 2022-11-25 DIAGNOSIS — R0602 Shortness of breath: Secondary | ICD-10-CM | POA: Diagnosis not present

## 2022-11-25 DIAGNOSIS — J984 Other disorders of lung: Secondary | ICD-10-CM | POA: Diagnosis present

## 2022-12-02 ENCOUNTER — Ambulatory Visit: Payer: Medicare Other | Admitting: Internal Medicine

## 2022-12-16 ENCOUNTER — Ambulatory Visit: Payer: Medicare Other | Admitting: Internal Medicine

## 2022-12-23 ENCOUNTER — Encounter: Payer: Medicare Other | Attending: Internal Medicine | Admitting: Internal Medicine

## 2022-12-23 DIAGNOSIS — G35 Multiple sclerosis: Secondary | ICD-10-CM | POA: Diagnosis not present

## 2022-12-23 DIAGNOSIS — I82409 Acute embolism and thrombosis of unspecified deep veins of unspecified lower extremity: Secondary | ICD-10-CM | POA: Diagnosis not present

## 2022-12-23 DIAGNOSIS — Z7901 Long term (current) use of anticoagulants: Secondary | ICD-10-CM | POA: Insufficient documentation

## 2022-12-23 DIAGNOSIS — I2699 Other pulmonary embolism without acute cor pulmonale: Secondary | ICD-10-CM | POA: Insufficient documentation

## 2022-12-23 DIAGNOSIS — L89154 Pressure ulcer of sacral region, stage 4: Secondary | ICD-10-CM | POA: Diagnosis not present

## 2022-12-23 DIAGNOSIS — M4628 Osteomyelitis of vertebra, sacral and sacrococcygeal region: Secondary | ICD-10-CM | POA: Diagnosis not present

## 2022-12-25 NOTE — Progress Notes (Signed)
ND, [name] 12/23/2022 9:30 A M Medical Record Number: [id number] Patient Account Number: [id number] Date of Birth/Sex: Treating RN: Jun 18, 1960 (62 y.o. [name] Primary Care Provider: Enid Baas Other Clinician: Referring  Provider: Treating Provider/Extender: Rica Mote Weeks in Treatment: 53 Constitutional . Psychiatric . Notes T the sacrum there is granulation tissue with no probing to bone. No signs of infection. Undermining circumferentially. o Electronic Signature(s) Signed: 12/23/2022 1:14:22 PM By: Geralyn Corwin DO Entered By: Geralyn Corwin on 12/23/2022 10:19:06 [name] ([id number]) [id number] Page 3 of 8 -------------------------------------------------------------------------------- Physician Orders Details Patient Name: Date of Service: [name], [name] 12/23/2022 9:30 A M Medical Record Number: [id number] Patient Account Number: [id number] Date of Birth/Sex: Treating RN: 08/14/1960 (62 y.o. [name] Primary Care Provider: Enid Baas Other Clinician: Referring Provider: Treating Provider/Extender: Rica Mote Weeks in Treatment: 90 Verbal / Phone Orders: No Diagnosis Coding Home Health CONTINUE Home Health for wound care. May utilize formulary equivalent dressing for wound treatment orders unless otherwise specified. Home Health Nurse may visit PRN to address patients wound care needs. - ADORATION May discontinue NPWT cont with W/D dakins , **Please direct any NON-WOUND related issues/requests for orders to patient's Primary Care Physician. **If current dressing causes regression in wound condition, may D/C ordered dressing product/s and apply Normal Saline Moist Dressing daily until next Wound Healing Center or Other MD appointment. **Notify Wound Healing Center of regression in wound condition at [phone]. - Okay for patient to start back with physical therapy since she is no longer on the [facility] Negative Pressure Wound Therapy Wound #3 Proximal,Midline Sacrum Home Health Nurse may d/c VAC for s/s of increased infection, significant wound regression, or  uncontrolled drainage. Notify Wound Healing Center at [phone]. - ADORATION Discontinue NPWT. Wound Treatment Wound #3 - Sacrum Wound Laterality: Midline, Proximal Cleanser: Dakin 16 (oz) 0.25 1 x Per Day/30 Days Discharge Instructions: Use as directed. Cleanser: Vashe 5.8 (oz) 1 x Per Day/30 Days Discharge Instructions: Use vashe 5.8 (oz) as directed Prim Dressing: Gauze 1 x Per Day/30 Days ary Discharge Instructions: As directed: dry, moistened with saline or moistened with Dakins Solution Secondary Dressing: ABD Pad 5x9 (in/in) 1 x Per Day/30 Days Discharge Instructions: Cover with ABD pad Secured With: Paper Tape, 0.5x10 (in/yd) 1 x Per Day/30 Days Electronic Signature(s) Signed: 12/23/2022 1:14:22 PM By: Geralyn Corwin DO Entered By: Geralyn Corwin on 12/23/2022 10:21:33 [name] ([id number]) [id number] Page 4 of 8 -------------------------------------------------------------------------------- Problem List Details Patient Name: Date of Service: [name], [name] 12/23/2022 9:30 A M Medical Record Number: [id number] Patient Account Number: [id number] Date of Birth/Sex: Treating RN: 03/28/1961 (62 y.o. 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, sacral and sacrococcygeal region 05/20/2022 No Yes G35 Multiple sclerosis 05/06/2022 No Yes I82.409 Acute embolism and thrombosis of unspecified deep veins of unspecified 05/06/2022 No Yes lower extremity Z79.01 Long term (current) use of anticoagulants 05/06/2022 No Yes Inactive  Problems ICD-10 Code Description Active Date Inactive Date L89.153 Pressure ulcer of sacral region, stage 3 05/06/2022 05/06/2022 Resolved Problems ICD-10 Code Description Active Date Resolved Date L89.620 Pressure ulcer of left heel, unstageable 05/06/2022 05/06/2022 L89.510 Pressure ulcer  Hasan Douse on 12/23/2022 10:21:42 MASHAYLA, DILONE ([id number]) [phone].pdf Page 8 of 8 -------------------------------------------------------------------------------- SuperBill Details Patient Name: Date of Service: [name], [name] 12/23/2022 Medical Record Number: [id number] Patient Account Number: [id number] Date of Birth/Sex: Treating RN: 10-10-1960 (62 y.o. 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, sacral and sacrococcygeal region G35 Multiple sclerosis I82.409 Acute embolism and thrombosis of unspecified deep veins of unspecified lower extremity Z79.01 Long term (current) use of anticoagulants Facility Procedures : CPT4 Code: 02725366 Description: 99213 - WOUND CARE VISIT-LEV 3 EST PT Modifier: Quantity: 1 Physician Procedures : CPT4 Code Description Modifier [id number] 99213 - WC PHYS LEVEL 3 - EST PT ICD-10 Diagnosis Description L89.154 Pressure ulcer of sacral region, 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, sacral and sacrococcygeal region G35 Multiple sclerosis Z79.01 Long  term (current) use of anticoagulants Quantity: 1 Electronic Signature(s) Signed: 12/23/2022 1:14:22 PM By: Geralyn Corwin DO Signed: 12/25/2022 12:41:22 PM By: Midge Aver MSN RN CNS WTA Entered  By: Midge Aver on 12/23/2022 10:27:03  Hasan Douse on 12/23/2022 10:21:42 MASHAYLA, DILONE ([id number]) [phone].pdf Page 8 of 8 -------------------------------------------------------------------------------- SuperBill Details Patient Name: Date of Service: [name], [name] 12/23/2022 Medical Record Number: [id number] Patient Account Number: [id number] Date of Birth/Sex: Treating RN: 10-10-1960 (62 y.o. 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, sacral and sacrococcygeal region G35 Multiple sclerosis I82.409 Acute embolism and thrombosis of unspecified deep veins of unspecified lower extremity Z79.01 Long term (current) use of anticoagulants Facility Procedures : CPT4 Code: 02725366 Description: 99213 - WOUND CARE VISIT-LEV 3 EST PT Modifier: Quantity: 1 Physician Procedures : CPT4 Code Description Modifier [id number] 99213 - WC PHYS LEVEL 3 - EST PT ICD-10 Diagnosis Description L89.154 Pressure ulcer of sacral region, 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, sacral and sacrococcygeal region G35 Multiple sclerosis Z79.01 Long  term (current) use of anticoagulants Quantity: 1 Electronic Signature(s) Signed: 12/23/2022 1:14:22 PM By: Geralyn Corwin DO Signed: 12/25/2022 12:41:22 PM By: Midge Aver MSN RN CNS WTA Entered  By: Midge Aver on 12/23/2022 10:27:03  of right ankle, unstageable 09/02/2022 09/02/2022 L89.523 Pressure ulcer of left ankle, stage 3 09/02/2022 09/02/2022 Electronic Signature(s) Signed: 12/23/2022 1:14:22 PM By: Geralyn Corwin DO Entered By: Geralyn Corwin on 12/23/2022 10:13:16 [name] ([id number]) [id number] Page 5 of 8 -------------------------------------------------------------------------------- Progress Note Details Patient Name: Date of Service: [name], [name] 12/23/2022 9:30 A M Medical Record Number: [id number] Patient Account Number: [id number] Date of Birth/Sex: Treating RN: 07/26/1960 (62 y.o. [name] Primary Care Provider: Enid Baas Other Clinician: Referring Provider: Treating Provider/Extender: Rica Mote Weeks in Treatment: 37 Subjective Chief Complaint Information obtained from Patient 05/06/2022; Bilateral feet wounds and sacral wounds History of Present Illness (HPI) 05/06/2022 Ms. Richele Chittum is a 62 year old female with a past medical history of multiple sclerosis and PEs and DVT that presents to the clinic for sacral s wounds and Bilateral feet wounds. Patient states that she obtained the sacral ulcers when she was hospitalized in November 2023 for bilateral pulmonary embolisms. She was in the ICU on a ventilator. Since discharge she has been using Dakin's wet-to-dry dressings to the area. Since her hospitalization she has been bedbound. However she is doing physical therapy. Prior to being hospitalized she was able to pull herself up and transfer from bed to chair on her own. Due to her MS she is not fully  mobile. Unfortunately she states that the wound has gotten larger on her sacrum despite Wound care. Patient has home health. On 04/16/2022 she was admitted to the hospital for severe sepsis secondary to acute UTI and sacral cellulitis. She completed 5 days of IV cefepime and vancomycin. She was not discharged with oral antibiotics. She states she developed the bilateral feet wounds at that time. She states she has bunny boots however she is not wearing them. She currently denies systemic signs of infection. 2/7; patient presents for follow-up. She had a bone biopsy done at last clinic visit that showed fragments of inflamed granulation tissue, necrotic material and bone with acute osteomyelitis. Culture grew Proteus mirabilis and Klebsiella pneumoniae. Patient is scheduled to see infectious disease next week. For now she has been doing Dakin's wet-to-dry dressings to the sacrum. She is not able to obtain Santyl and has been keeping the left heel covered. T the right foot where o there was a previous wound with scab however this has healed. This has remained closed. No open wound noted. She states over the past week she has had chills but no fevers, nausea/vomiting or purulent drainage. 2/21; patient presents for follow-up. She saw Dr. Joylene Draft on 2/15. 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 Today she had feces impacted in the wound and throughout the tunneling. We discussed a diverting colostomy to address this issue. She was agreeable with a referral to general surgery. She currently denies systemic signs of infection. She has been using Medihoney and Hydrofera Blue to the left heel wound. She has been using her Prevalon boots. 3/20; patient presents for follow-up. She has been using Dakin's wet-to-dry dressings to the sacral wound. She has developed a new wound to her right lateral heel. She is using Medihoney and Hydrofera Blue to the left heel. She is currently taking  Levaquin to complete 6 weeks. She follows with infectious disease for this. She saw general surgery at [facility] to discuss potentially doing a diverting colostomy however per patient's surgeon did not recommend this. 4/17; patient presents for follow-up. Has been using Dakin's wet-to-dry dressings to the sacral wound. She  ND, [name] 12/23/2022 9:30 A M Medical Record Number: [id number] Patient Account Number: [id number] Date of Birth/Sex: Treating RN: Jun 18, 1960 (62 y.o. [name] Primary Care Provider: Enid Baas Other Clinician: Referring  Provider: Treating Provider/Extender: Rica Mote Weeks in Treatment: 53 Constitutional . Psychiatric . Notes T the sacrum there is granulation tissue with no probing to bone. No signs of infection. Undermining circumferentially. o Electronic Signature(s) Signed: 12/23/2022 1:14:22 PM By: Geralyn Corwin DO Entered By: Geralyn Corwin on 12/23/2022 10:19:06 [name] ([id number]) [id number] Page 3 of 8 -------------------------------------------------------------------------------- Physician Orders Details Patient Name: Date of Service: [name], [name] 12/23/2022 9:30 A M Medical Record Number: [id number] Patient Account Number: [id number] Date of Birth/Sex: Treating RN: 08/14/1960 (62 y.o. [name] Primary Care Provider: Enid Baas Other Clinician: Referring Provider: Treating Provider/Extender: Rica Mote Weeks in Treatment: 90 Verbal / Phone Orders: No Diagnosis Coding Home Health CONTINUE Home Health for wound care. May utilize formulary equivalent dressing for wound treatment orders unless otherwise specified. Home Health Nurse may visit PRN to address patients wound care needs. - ADORATION May discontinue NPWT cont with W/D dakins , **Please direct any NON-WOUND related issues/requests for orders to patient's Primary Care Physician. **If current dressing causes regression in wound condition, may D/C ordered dressing product/s and apply Normal Saline Moist Dressing daily until next Wound Healing Center or Other MD appointment. **Notify Wound Healing Center of regression in wound condition at [phone]. - Okay for patient to start back with physical therapy since she is no longer on the [facility] Negative Pressure Wound Therapy Wound #3 Proximal,Midline Sacrum Home Health Nurse may d/c VAC for s/s of increased infection, significant wound regression, or  uncontrolled drainage. Notify Wound Healing Center at [phone]. - ADORATION Discontinue NPWT. Wound Treatment Wound #3 - Sacrum Wound Laterality: Midline, Proximal Cleanser: Dakin 16 (oz) 0.25 1 x Per Day/30 Days Discharge Instructions: Use as directed. Cleanser: Vashe 5.8 (oz) 1 x Per Day/30 Days Discharge Instructions: Use vashe 5.8 (oz) as directed Prim Dressing: Gauze 1 x Per Day/30 Days ary Discharge Instructions: As directed: dry, moistened with saline or moistened with Dakins Solution Secondary Dressing: ABD Pad 5x9 (in/in) 1 x Per Day/30 Days Discharge Instructions: Cover with ABD pad Secured With: Paper Tape, 0.5x10 (in/yd) 1 x Per Day/30 Days Electronic Signature(s) Signed: 12/23/2022 1:14:22 PM By: Geralyn Corwin DO Entered By: Geralyn Corwin on 12/23/2022 10:21:33 [name] ([id number]) [id number] Page 4 of 8 -------------------------------------------------------------------------------- Problem List Details Patient Name: Date of Service: [name], [name] 12/23/2022 9:30 A M Medical Record Number: [id number] Patient Account Number: [id number] Date of Birth/Sex: Treating RN: 03/28/1961 (62 y.o. 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, sacral and sacrococcygeal region 05/20/2022 No Yes G35 Multiple sclerosis 05/06/2022 No Yes I82.409 Acute embolism and thrombosis of unspecified deep veins of unspecified 05/06/2022 No Yes lower extremity Z79.01 Long term (current) use of anticoagulants 05/06/2022 No Yes Inactive  Problems ICD-10 Code Description Active Date Inactive Date L89.153 Pressure ulcer of sacral region, stage 3 05/06/2022 05/06/2022 Resolved Problems ICD-10 Code Description Active Date Resolved Date L89.620 Pressure ulcer of left heel, unstageable 05/06/2022 05/06/2022 L89.510 Pressure ulcer  Hasan Douse on 12/23/2022 10:21:42 MASHAYLA, DILONE ([id number]) [phone].pdf Page 8 of 8 -------------------------------------------------------------------------------- SuperBill Details Patient Name: Date of Service: [name], [name] 12/23/2022 Medical Record Number: [id number] Patient Account Number: [id number] Date of Birth/Sex: Treating RN: 10-10-1960 (62 y.o. 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, sacral and sacrococcygeal region G35 Multiple sclerosis I82.409 Acute embolism and thrombosis of unspecified deep veins of unspecified lower extremity Z79.01 Long term (current) use of anticoagulants Facility Procedures : CPT4 Code: 02725366 Description: 99213 - WOUND CARE VISIT-LEV 3 EST PT Modifier: Quantity: 1 Physician Procedures : CPT4 Code Description Modifier [id number] 99213 - WC PHYS LEVEL 3 - EST PT ICD-10 Diagnosis Description L89.154 Pressure ulcer of sacral region, 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 [name] Primary Care Provider: Enid Baas Other Clinician: Referring  Provider: Treating Provider/Extender: Rica Mote Weeks in Treatment: 53 Constitutional . Psychiatric . Notes T the sacrum there is granulation tissue with no probing to bone. No signs of infection. Undermining circumferentially. o Electronic Signature(s) Signed: 12/23/2022 1:14:22 PM By: Geralyn Corwin DO Entered By: Geralyn Corwin on 12/23/2022 10:19:06 [name] ([id number]) [id number] Page 3 of 8 -------------------------------------------------------------------------------- Physician Orders Details Patient Name: Date of Service: [name], [name] 12/23/2022 9:30 A M Medical Record Number: [id number] Patient Account Number: [id number] Date of Birth/Sex: Treating RN: 08/14/1960 (62 y.o. [name] Primary Care Provider: Enid Baas Other Clinician: Referring Provider: Treating Provider/Extender: Rica Mote Weeks in Treatment: 90 Verbal / Phone Orders: No Diagnosis Coding Home Health CONTINUE Home Health for wound care. May utilize formulary equivalent dressing for wound treatment orders unless otherwise specified. Home Health Nurse may visit PRN to address patients wound care needs. - ADORATION May discontinue NPWT cont with W/D dakins , **Please direct any NON-WOUND related issues/requests for orders to patient's Primary Care Physician. **If current dressing causes regression in wound condition, may D/C ordered dressing product/s and apply Normal Saline Moist Dressing daily until next Wound Healing Center or Other MD appointment. **Notify Wound Healing Center of regression in wound condition at [phone]. - Okay for patient to start back with physical therapy since she is no longer on the [facility] Negative Pressure Wound Therapy Wound #3 Proximal,Midline Sacrum Home Health Nurse may d/c VAC for s/s of increased infection, significant wound regression, or  uncontrolled drainage. Notify Wound Healing Center at [phone]. - ADORATION Discontinue NPWT. Wound Treatment Wound #3 - Sacrum Wound Laterality: Midline, Proximal Cleanser: Dakin 16 (oz) 0.25 1 x Per Day/30 Days Discharge Instructions: Use as directed. Cleanser: Vashe 5.8 (oz) 1 x Per Day/30 Days Discharge Instructions: Use vashe 5.8 (oz) as directed Prim Dressing: Gauze 1 x Per Day/30 Days ary Discharge Instructions: As directed: dry, moistened with saline or moistened with Dakins Solution Secondary Dressing: ABD Pad 5x9 (in/in) 1 x Per Day/30 Days Discharge Instructions: Cover with ABD pad Secured With: Paper Tape, 0.5x10 (in/yd) 1 x Per Day/30 Days Electronic Signature(s) Signed: 12/23/2022 1:14:22 PM By: Geralyn Corwin DO Entered By: Geralyn Corwin on 12/23/2022 10:21:33 [name] ([id number]) [id number] Page 4 of 8 -------------------------------------------------------------------------------- Problem List Details Patient Name: Date of Service: [name], [name] 12/23/2022 9:30 A M Medical Record Number: [id number] Patient Account Number: [id number] Date of Birth/Sex: Treating RN: 03/28/1961 (62 y.o. 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

## 2022-12-25 NOTE — Progress Notes (Signed)
photographs) X- 1 5 Simple Wound Measurement - one wound []  - 0 Complex Wound Measurement - multiple wounds INTERVENTIONS - Wound Dressings X - Small Wound Dressing one or multiple wounds 1 10 []  - 0 Medium Wound Dressing one or multiple wounds []  - 0 Large Wound Dressing one or multiple wounds []  - 0 Application of Medications - topical []  - 0 Application of Medications - injection INTERVENTIONS - Miscellaneous []  - 0 External ear exam []  - 0 Specimen Collection (cultures, biopsies, blood, body fluids, etc.) [name], [name] ([id number]) [id number] Page 3 of 9 []  - 0 Specimen(s) / Culture(s) sent or taken to Lab for analysis X- 1 10 Patient Transfer (multiple staff / Michiel [name] Lift / Similar devices) []  - 0 Simple Staple / Suture removal (25 or less) []  - 0 Complex Staple / Suture removal (26 or more) []  - 0 Hypo / Hyperglycemic Management (close monitor of Blood Glucose) []  - 0 Ankle / Brachial Index (ABI) - do not check if billed separately X- 1 5 Vital Signs Has the patient been seen at the hospital within the last three years: Yes Total Score: 95 Level Of Care: New/Established - Level 3 Electronic Signature(s) Signed: 12/25/2022 12:41:22 PM By: [name] Aver MSN RN CNS WTA Entered By: [name] on 12/23/2022 10:26:47 -------------------------------------------------------------------------------- Encounter Discharge Information Details Patient Name: Date of Service: [name], [name] 12/23/2022 9:30 A M Medical Record Number: [id number] Patient Account Number: [id number] Date of Birth/Sex: Treating RN: 04-10-1961 (62 y.o. [name]: [name] Other Clinician: Referring  [name]: Treating [name]: [name]: 101 Encounter Discharge Information Items Discharge Condition: Stable Ambulatory Status: Wheelchair Discharge Destination: Home Transportation: Private Auto Accompanied By: boyfriend Schedule Follow-up Appointment: Yes Clinical Summary of Care: Electronic Signature(s) Signed: 12/25/2022 12:41:22 PM By: [name] Aver MSN RN CNS WTA Entered By: [name] on 12/23/2022 10:29:58 -------------------------------------------------------------------------------- Lower Extremity Assessment Details Patient Name: Date of Service: [name], [name] 12/23/2022 9:30 A M Medical Record Number: [id number] Patient Account Number: [id number] Date of Birth/Sex: Treating RN: 1960/04/25 (62 y.o. [name], [name], [name] ([id number]) [id number] Page 4 of 9 Primary Care [name]: [name] Other Clinician: Referring [name]: Treating [name]/Extender: [name]: 66 Electronic Signature(s) Signed: 12/25/2022 12:41:22 PM By: [name] Aver MSN RN CNS WTA Entered By: [name] on 12/23/2022 09:54:03 -------------------------------------------------------------------------------- Multi Wound Chart Details Patient Name: Date of Service: [name], [name] 12/23/2022 9:30 A M Medical Record Number: [id number] Patient Account Number: [id number] Date of Birth/Sex: Treating RN: 1960-08-06 (62 y.o. [name] Primary Care [name]: [name] Other Clinician: Referring [name]: Treating [name]/Extender: [name]: 55 Vital Signs Height(in): Pulse(bpm): 113 Weight(lbs): Blood Pressure(mmHg): 102/64 Body Mass Index(BMI): Temperature(F): 98.0 Respiratory Rate(breaths/min): 18 [3:Photos:] [N/A:N/A] Proximal, Midline Sacrum N/A N/A Wound  Location: Pressure Injury N/A N/A Wounding Event: Pressure Ulcer N/A N/A Primary Etiology: Angina, Hypotension N/A N/A Comorbid History: 03/09/2022 N/A N/A Date Acquired: 19 N/A N/A Weeks of [name]: Open N/A N/A Wound Status: No N/A N/A Wound Recurrence: 1.7x1x1.2 N/A N/A Measurements L x W x D (cm) 1.335 N/A N/A A (cm) : rea 1.602 N/A N/A Volume (cm) : 94.30% N/A N/A % Reduction in A rea: 98.90% N/A N/A % Reduction in Volume: 6 Starting Position 1 (o'clock): 7 Ending Position 1 (o'clock): 1.2 Maximum Distance 1 (cm): Yes N/A N/A Undermining: Category/Stage IV  Date of Service: [name], [name] 12/23/2022 9:30 A M Medical Record Number: [id number] Patient Account Number: [id number] Date of Birth/Sex: Treating RN: 1960/10/08 (62 y.o. [name] Primary Care [name]: [name] Other Clinician: Referring [name]: Treating [name]: [name]: 33 Wound Status Wound Number: 3 Primary Etiology: Pressure  Ulcer Wound Location: Proximal, Midline Sacrum Wound Status: Open Wounding Event: Pressure Injury Comorbid History: Angina, Hypotension Date Acquired: 03/09/2022 Weeks Of [name]: 33 Clustered Wound: No Photos Wound Measurements Length: (cm) 1.7 Width: (cm) 1 Depth: (cm) 1.2 Area: (cm) 1. Volume: (cm) 1. [name] ([id number]) % Reduction in Area: 94.3% % Reduction in Volume: 98.9% Epithelialization: None 335 Undermining: Yes 602 Starting Position (o'clock): 6 [id number] Page 8 of 9 Ending Position (o'clock): 7 Maximum Distance: (cm) 1.2 Wound Description Classification: Category/Stage IV Wound Margin: Epibole Exudate Amount: Large Exudate Type: Serosanguineous Exudate Color: red, brown Foul Odor After Cleansing: No Slough/Fibrino Yes Wound Bed Granulation Amount: Large (67-100%) Exposed Structure Granulation Quality: Red, Hyper-granulation Fascia Exposed: No Necrotic Amount: Small (1-33%) Fat Layer (Subcutaneous Tissue) Exposed: Yes Tendon Exposed: No Muscle Exposed: Yes Necrosis of Muscle: No Joint Exposed: No Bone Exposed: No [name] Notes Wound #3 (Sacrum) Wound Laterality: Midline, Proximal Cleanser Dakin 16 (oz) 0.25 Discharge Instruction: Use as directed. Vashe 5.8 (oz) Discharge Instruction: Use vashe 5.8 (oz) as directed Peri-Wound Care Topical Primary Dressing Gauze Discharge Instruction: As directed: dry, moistened with saline or moistened with Dakins Solution Secondary Dressing ABD Pad 5x9 (in/in) Discharge Instruction: Cover with ABD pad Secured With Paper Tape, 0.5x10 (in/yd) Compression Wrap Compression Stockings Add-Ons Electronic Signature(s) Signed: 12/25/2022 12:41:22 PM By: [name] Aver MSN RN CNS WTA Entered By: [name] on 12/23/2022 09:53:51 -------------------------------------------------------------------------------- Vitals Details Patient Name: Date of Service: [name] Glen  [name], [name] 12/23/2022 9:30 A M Medical Record Number: [id number] Patient Account Number: [id number] Date of Birth/Sex: Treating RN: Jun 24, 1960 (62 y.o. [name] Primary Care Deni Berti: [name] Other Clinician: [name] ([id number]) [id number] Page 9 of 9 Referring Azai Gaffin: Treating Paiden Cavell/Extender: [name]: 33 Vital Signs Time Taken: 09:50 Temperature (F): 98.0 Pulse (bpm): 113 Respiratory Rate (breaths/min): 18 Blood Pressure (mmHg): 102/64 Reference Range: 80 - 120 mg / dl Electronic Signature(s) Signed: 12/25/2022 12:41:22 PM By: [name] Aver MSN RN CNS WTA Entered By: [name] on 12/23/2022 09:52:42  photographs) X- 1 5 Simple Wound Measurement - one wound []  - 0 Complex Wound Measurement - multiple wounds INTERVENTIONS - Wound Dressings X - Small Wound Dressing one or multiple wounds 1 10 []  - 0 Medium Wound Dressing one or multiple wounds []  - 0 Large Wound Dressing one or multiple wounds []  - 0 Application of Medications - topical []  - 0 Application of Medications - injection INTERVENTIONS - Miscellaneous []  - 0 External ear exam []  - 0 Specimen Collection (cultures, biopsies, blood, body fluids, etc.) [name], [name] ([id number]) [id number] Page 3 of 9 []  - 0 Specimen(s) / Culture(s) sent or taken to Lab for analysis X- 1 10 Patient Transfer (multiple staff / Michiel [name] Lift / Similar devices) []  - 0 Simple Staple / Suture removal (25 or less) []  - 0 Complex Staple / Suture removal (26 or more) []  - 0 Hypo / Hyperglycemic Management (close monitor of Blood Glucose) []  - 0 Ankle / Brachial Index (ABI) - do not check if billed separately X- 1 5 Vital Signs Has the patient been seen at the hospital within the last three years: Yes Total Score: 95 Level Of Care: New/Established - Level 3 Electronic Signature(s) Signed: 12/25/2022 12:41:22 PM By: [name] Aver MSN RN CNS WTA Entered By: [name] on 12/23/2022 10:26:47 -------------------------------------------------------------------------------- Encounter Discharge Information Details Patient Name: Date of Service: [name], [name] 12/23/2022 9:30 A M Medical Record Number: [id number] Patient Account Number: [id number] Date of Birth/Sex: Treating RN: 04-10-1961 (62 y.o. [name]: [name] Other Clinician: Referring  [name]: Treating [name]: [name]: 101 Encounter Discharge Information Items Discharge Condition: Stable Ambulatory Status: Wheelchair Discharge Destination: Home Transportation: Private Auto Accompanied By: boyfriend Schedule Follow-up Appointment: Yes Clinical Summary of Care: Electronic Signature(s) Signed: 12/25/2022 12:41:22 PM By: [name] Aver MSN RN CNS WTA Entered By: [name] on 12/23/2022 10:29:58 -------------------------------------------------------------------------------- Lower Extremity Assessment Details Patient Name: Date of Service: [name], [name] 12/23/2022 9:30 A M Medical Record Number: [id number] Patient Account Number: [id number] Date of Birth/Sex: Treating RN: 1960/04/25 (62 y.o. [name], [name], [name] ([id number]) [id number] Page 4 of 9 Primary Care [name]: [name] Other Clinician: Referring [name]: Treating [name]/Extender: [name]: 66 Electronic Signature(s) Signed: 12/25/2022 12:41:22 PM By: [name] Aver MSN RN CNS WTA Entered By: [name] on 12/23/2022 09:54:03 -------------------------------------------------------------------------------- Multi Wound Chart Details Patient Name: Date of Service: [name], [name] 12/23/2022 9:30 A M Medical Record Number: [id number] Patient Account Number: [id number] Date of Birth/Sex: Treating RN: 1960-08-06 (62 y.o. [name] Primary Care [name]: [name] Other Clinician: Referring [name]: Treating [name]/Extender: [name]: 55 Vital Signs Height(in): Pulse(bpm): 113 Weight(lbs): Blood Pressure(mmHg): 102/64 Body Mass Index(BMI): Temperature(F): 98.0 Respiratory Rate(breaths/min): 18 [3:Photos:] [N/A:N/A] Proximal, Midline Sacrum N/A N/A Wound  Location: Pressure Injury N/A N/A Wounding Event: Pressure Ulcer N/A N/A Primary Etiology: Angina, Hypotension N/A N/A Comorbid History: 03/09/2022 N/A N/A Date Acquired: 19 N/A N/A Weeks of [name]: Open N/A N/A Wound Status: No N/A N/A Wound Recurrence: 1.7x1x1.2 N/A N/A Measurements L x W x D (cm) 1.335 N/A N/A A (cm) : rea 1.602 N/A N/A Volume (cm) : 94.30% N/A N/A % Reduction in A rea: 98.90% N/A N/A % Reduction in Volume: 6 Starting Position 1 (o'clock): 7 Ending Position 1 (o'clock): 1.2 Maximum Distance 1 (cm): Yes N/A N/A Undermining: Category/Stage IV  photographs) X- 1 5 Simple Wound Measurement - one wound []  - 0 Complex Wound Measurement - multiple wounds INTERVENTIONS - Wound Dressings X - Small Wound Dressing one or multiple wounds 1 10 []  - 0 Medium Wound Dressing one or multiple wounds []  - 0 Large Wound Dressing one or multiple wounds []  - 0 Application of Medications - topical []  - 0 Application of Medications - injection INTERVENTIONS - Miscellaneous []  - 0 External ear exam []  - 0 Specimen Collection (cultures, biopsies, blood, body fluids, etc.) [name], [name] ([id number]) [id number] Page 3 of 9 []  - 0 Specimen(s) / Culture(s) sent or taken to Lab for analysis X- 1 10 Patient Transfer (multiple staff / Michiel [name] Lift / Similar devices) []  - 0 Simple Staple / Suture removal (25 or less) []  - 0 Complex Staple / Suture removal (26 or more) []  - 0 Hypo / Hyperglycemic Management (close monitor of Blood Glucose) []  - 0 Ankle / Brachial Index (ABI) - do not check if billed separately X- 1 5 Vital Signs Has the patient been seen at the hospital within the last three years: Yes Total Score: 95 Level Of Care: New/Established - Level 3 Electronic Signature(s) Signed: 12/25/2022 12:41:22 PM By: [name] Aver MSN RN CNS WTA Entered By: [name] on 12/23/2022 10:26:47 -------------------------------------------------------------------------------- Encounter Discharge Information Details Patient Name: Date of Service: [name], [name] 12/23/2022 9:30 A M Medical Record Number: [id number] Patient Account Number: [id number] Date of Birth/Sex: Treating RN: 04-10-1961 (62 y.o. [name]: [name] Other Clinician: Referring  [name]: Treating [name]: [name]: 101 Encounter Discharge Information Items Discharge Condition: Stable Ambulatory Status: Wheelchair Discharge Destination: Home Transportation: Private Auto Accompanied By: boyfriend Schedule Follow-up Appointment: Yes Clinical Summary of Care: Electronic Signature(s) Signed: 12/25/2022 12:41:22 PM By: [name] Aver MSN RN CNS WTA Entered By: [name] on 12/23/2022 10:29:58 -------------------------------------------------------------------------------- Lower Extremity Assessment Details Patient Name: Date of Service: [name], [name] 12/23/2022 9:30 A M Medical Record Number: [id number] Patient Account Number: [id number] Date of Birth/Sex: Treating RN: 1960/04/25 (62 y.o. [name], [name], [name] ([id number]) [id number] Page 4 of 9 Primary Care [name]: [name] Other Clinician: Referring [name]: Treating [name]/Extender: [name]: 66 Electronic Signature(s) Signed: 12/25/2022 12:41:22 PM By: [name] Aver MSN RN CNS WTA Entered By: [name] on 12/23/2022 09:54:03 -------------------------------------------------------------------------------- Multi Wound Chart Details Patient Name: Date of Service: [name], [name] 12/23/2022 9:30 A M Medical Record Number: [id number] Patient Account Number: [id number] Date of Birth/Sex: Treating RN: 1960-08-06 (62 y.o. [name] Primary Care [name]: [name] Other Clinician: Referring [name]: Treating [name]/Extender: [name]: 55 Vital Signs Height(in): Pulse(bpm): 113 Weight(lbs): Blood Pressure(mmHg): 102/64 Body Mass Index(BMI): Temperature(F): 98.0 Respiratory Rate(breaths/min): 18 [3:Photos:] [N/A:N/A] Proximal, Midline Sacrum N/A N/A Wound  Location: Pressure Injury N/A N/A Wounding Event: Pressure Ulcer N/A N/A Primary Etiology: Angina, Hypotension N/A N/A Comorbid History: 03/09/2022 N/A N/A Date Acquired: 19 N/A N/A Weeks of [name]: Open N/A N/A Wound Status: No N/A N/A Wound Recurrence: 1.7x1x1.2 N/A N/A Measurements L x W x D (cm) 1.335 N/A N/A A (cm) : rea 1.602 N/A N/A Volume (cm) : 94.30% N/A N/A % Reduction in A rea: 98.90% N/A N/A % Reduction in Volume: 6 Starting Position 1 (o'clock): 7 Ending Position 1 (o'clock): 1.2 Maximum Distance 1 (cm): Yes N/A N/A Undermining: Category/Stage IV

## 2023-01-06 ENCOUNTER — Encounter: Payer: Medicare Other | Admitting: Internal Medicine

## 2023-01-06 DIAGNOSIS — L89154 Pressure ulcer of sacral region, stage 4: Secondary | ICD-10-CM | POA: Diagnosis not present

## 2023-01-08 NOTE — Progress Notes (Signed)
[id number] Other Clinician: Referring Provider: Treating Provider/Extender: RO BSO N, MICHA EL Verdis Prime, Radhika Weeks in Treatment: 35 Constitutional Patient is hypotensive.. Pulse regular and within target range for patient.Marland Kitchen Respirations regular, non-labored and within target range.. Temperature is normal and within the target range for the patient.Marland Kitchen appears in no distress. Notes Wound exam; sacrum there is  granulation tissue. It does not probe to bone however there is an undermining area distally in the wound probably half a centimeter. This may be an improvement from last time at which time circumferential undermining was noted. I see no evidence of infection. No debridement is required Electronic Signature(s) Signed: 01/06/2023 4:28:36 PM By: Baltazar Najjar MD Entered By: Baltazar Najjar on 01/06/2023 11:08:35 -------------------------------------------------------------------------------- Physician Orders Details Patient Name: Date of Service: [name] Glen [name], [name] 01/06/2023 1:15 PM Medical Record Number: [id number] Patient Account Number: [id number] Date of Birth/Sex: Treating RN: 1960/10/07 (62 y.o. 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 May utilize formulary equivalent dressing for wound treatment orders unless otherwise specified. Home Health Nurse may visit PRN to address patients wound care needs. - ADORATION May discontinue NPWT change to collagen and , hydrogel **Please direct any NON-WOUND related issues/requests for orders to patient's Primary Care Physician. **If current dressing causes regression in wound condition, may D/C ordered dressing product/s and apply Normal Saline  Moist Dressing daily until next Wound Healing Center or Other MD appointment. **Notify Wound Healing Center of regression in wound condition at [phone]. - Okay for patient to start back with physical therapy since she is no longer on the [facility] Negative Pressure Wound Therapy Wound #3 Proximal,Midline Sacrum Home Health Nurse may d/c VAC for s/s of increased infection, significant wound regression, or uncontrolled drainage. Notify Wound Healing Center at [phone]. - ADORATION Discontinue NPWT. Wound Treatment Wound #3 - Sacrum Wound Laterality: Midline, Proximal Cleanser: Vashe 5.8 (oz) 1 x Per Day/30 Days Discharge Instructions: Use vashe 5.8 (oz) as directed Prim Dressing: Gauze 1 x Per Day/30 Days ary Discharge Instructions: As directed: dry, moistened with saline or moistened with Dakins Solution Prim Dressing: Prisma 4.34 (in) (Home Health) 1 x Per Day/30 Days ary Discharge Instructions: Moisten w/normal saline or sterile water; Cover wound as directed. Do not remove from wound bed. Secondary Dressing: ABD Pad 5x9 (in/in) 1 x Per Day/30 Days Discharge Instructions: Cover with ABD pad Secured With: Paper Tape, 0.5x10 (in/yd) 1 x Per Day/30 Days Electronic Signature(s) Signed: 01/06/2023 4:28:36 PM By: Baltazar Najjar MD Signed: 01/08/2023 12:30:42 PM By: Midge Aver MSN RN CNS WTA Entered By: Midge Aver on 01/06/2023 11:22:11 -------------------------------------------------------------------------------- Problem List Details Patient Name: Date of Service: [name] Glen [name], [name] 01/06/2023 1:15 PM Medical Record Number: [id number] Patient Account Number: [id number] Date of Birth/Sex: Treating RN: 1961/03/04 (62 y.o. [name] Primary Care Provider: Enid Baas Other Clinician: Referring Provider: Treating Provider/Extender: RO BSO N, MICHA EL Verdis Prime, Radhika Weeks in Treatment: 35 Active Problems  [id number] Other Clinician: Referring Provider: Treating Provider/Extender: RO BSO N, MICHA EL Verdis Prime, Radhika Weeks in Treatment: 35 Constitutional Patient is hypotensive.. Pulse regular and within target range for patient.Marland Kitchen Respirations regular, non-labored and within target range.. Temperature is normal and within the target range for the patient.Marland Kitchen appears in no distress. Notes Wound exam; sacrum there is  granulation tissue. It does not probe to bone however there is an undermining area distally in the wound probably half a centimeter. This may be an improvement from last time at which time circumferential undermining was noted. I see no evidence of infection. No debridement is required Electronic Signature(s) Signed: 01/06/2023 4:28:36 PM By: Baltazar Najjar MD Entered By: Baltazar Najjar on 01/06/2023 11:08:35 -------------------------------------------------------------------------------- Physician Orders Details Patient Name: Date of Service: [name] Glen [name], [name] 01/06/2023 1:15 PM Medical Record Number: [id number] Patient Account Number: [id number] Date of Birth/Sex: Treating RN: 1960/10/07 (62 y.o. 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 May utilize formulary equivalent dressing for wound treatment orders unless otherwise specified. Home Health Nurse may visit PRN to address patients wound care needs. - ADORATION May discontinue NPWT change to collagen and , hydrogel **Please direct any NON-WOUND related issues/requests for orders to patient's Primary Care Physician. **If current dressing causes regression in wound condition, may D/C ordered dressing product/s and apply Normal Saline  Moist Dressing daily until next Wound Healing Center or Other MD appointment. **Notify Wound Healing Center of regression in wound condition at [phone]. - Okay for patient to start back with physical therapy since she is no longer on the [facility] Negative Pressure Wound Therapy Wound #3 Proximal,Midline Sacrum Home Health Nurse may d/c VAC for s/s of increased infection, significant wound regression, or uncontrolled drainage. Notify Wound Healing Center at [phone]. - ADORATION Discontinue NPWT. Wound Treatment Wound #3 - Sacrum Wound Laterality: Midline, Proximal Cleanser: Vashe 5.8 (oz) 1 x Per Day/30 Days Discharge Instructions: Use vashe 5.8 (oz) as directed Prim Dressing: Gauze 1 x Per Day/30 Days ary Discharge Instructions: As directed: dry, moistened with saline or moistened with Dakins Solution Prim Dressing: Prisma 4.34 (in) (Home Health) 1 x Per Day/30 Days ary Discharge Instructions: Moisten w/normal saline or sterile water; Cover wound as directed. Do not remove from wound bed. Secondary Dressing: ABD Pad 5x9 (in/in) 1 x Per Day/30 Days Discharge Instructions: Cover with ABD pad Secured With: Paper Tape, 0.5x10 (in/yd) 1 x Per Day/30 Days Electronic Signature(s) Signed: 01/06/2023 4:28:36 PM By: Baltazar Najjar MD Signed: 01/08/2023 12:30:42 PM By: Midge Aver MSN RN CNS WTA Entered By: Midge Aver on 01/06/2023 11:22:11 -------------------------------------------------------------------------------- Problem List Details Patient Name: Date of Service: [name] Glen [name], [name] 01/06/2023 1:15 PM Medical Record Number: [id number] Patient Account Number: [id number] Date of Birth/Sex: Treating RN: 1961/03/04 (62 y.o. [name] Primary Care Provider: Enid Baas Other Clinician: Referring Provider: Treating Provider/Extender: RO BSO N, MICHA EL Verdis Prime, Radhika Weeks in Treatment: 35 Active Problems  WUJWJXB Other Clinician: Referring Provider: Treating Provider/Extender: RO BSO N, MICHA EL Verdis Prime, Radhika Weeks in Treatment: 35 Constitutional Patient is hypotensive.. Pulse regular and within target range for patient.Marland Kitchen Respirations regular, non-labored and within target range.. Temperature is normal and within the target range for the patient.Marland Kitchen appears in no distress. Notes Wound exam; sacrum there is  granulation tissue. It does not probe to bone however there is an undermining area distally in the wound probably half a centimeter. This may be an improvement from last time at which time circumferential undermining was noted. I see no evidence of infection. No debridement is required Electronic Signature(s) Signed: 01/06/2023 4:28:36 PM By: Baltazar Najjar MD Entered By: Baltazar Najjar on 01/06/2023 11:08:35 -------------------------------------------------------------------------------- Physician Orders Details Patient Name: Date of Service: [name] Glen [name], [name] 01/06/2023 1:15 PM Medical Record Number: [id number] Patient Account Number: [id number] Date of Birth/Sex: Treating RN: 1960/10/07 (62 y.o. 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 May utilize formulary equivalent dressing for wound treatment orders unless otherwise specified. Home Health Nurse may visit PRN to address patients wound care needs. - ADORATION May discontinue NPWT change to collagen and , hydrogel **Please direct any NON-WOUND related issues/requests for orders to patient's Primary Care Physician. **If current dressing causes regression in wound condition, may D/C ordered dressing product/s and apply Normal Saline  Moist Dressing daily until next Wound Healing Center or Other MD appointment. **Notify Wound Healing Center of regression in wound condition at [phone]. - Okay for patient to start back with physical therapy since she is no longer on the [facility] Negative Pressure Wound Therapy Wound #3 Proximal,Midline Sacrum Home Health Nurse may d/c VAC for s/s of increased infection, significant wound regression, or uncontrolled drainage. Notify Wound Healing Center at [phone]. - ADORATION Discontinue NPWT. Wound Treatment Wound #3 - Sacrum Wound Laterality: Midline, Proximal Cleanser: Vashe 5.8 (oz) 1 x Per Day/30 Days Discharge Instructions: Use vashe 5.8 (oz) as directed Prim Dressing: Gauze 1 x Per Day/30 Days ary Discharge Instructions: As directed: dry, moistened with saline or moistened with Dakins Solution Prim Dressing: Prisma 4.34 (in) (Home Health) 1 x Per Day/30 Days ary Discharge Instructions: Moisten w/normal saline or sterile water; Cover wound as directed. Do not remove from wound bed. Secondary Dressing: ABD Pad 5x9 (in/in) 1 x Per Day/30 Days Discharge Instructions: Cover with ABD pad Secured With: Paper Tape, 0.5x10 (in/yd) 1 x Per Day/30 Days Electronic Signature(s) Signed: 01/06/2023 4:28:36 PM By: Baltazar Najjar MD Signed: 01/08/2023 12:30:42 PM By: Midge Aver MSN RN CNS WTA Entered By: Midge Aver on 01/06/2023 11:22:11 -------------------------------------------------------------------------------- Problem List Details Patient Name: Date of Service: [name] Glen [name], [name] 01/06/2023 1:15 PM Medical Record Number: [id number] Patient Account Number: [id number] Date of Birth/Sex: Treating RN: 1961/03/04 (62 y.o. [name] Primary Care Provider: Enid Baas Other Clinician: Referring Provider: Treating Provider/Extender: RO BSO N, MICHA EL Verdis Prime, Radhika Weeks in Treatment: 35 Active Problems  ago as it has been a hassle to keep a seal. She has been using Dakin's wet-to-dry dressings. She has no issues or complaints. She denies signs of infection. 9/25; 2-week follow-up for this patient with advanced MS. 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 This is apparently come in quite a bit. They have been  using Dakin's wet-to-dry for quite a period of time now. Her boyfriend changes the dressing. She is careful about offloading this, has a wheelchair cushion etc. Objective Constitutional Patient is hypotensive.. Pulse regular and within target range for patient.Marland Kitchen Respirations regular, non-labored and within target range.. Temperature is normal and within the target range for the patient.Marland Kitchen appears in no distress. Vitals Time Taken: 1:45 AM, Temperature: 98.1 F, Pulse: 102 bpm, Respiratory Rate: 18 breaths/min, Blood Pressure: 94/60 mmHg. General Notes: Wound exam; sacrum there is granulation tissue. It does not probe to bone however there is an undermining area distally in the wound probably half a centimeter. This may be an improvement from last time at which time circumferential undermining was noted. I see no evidence of infection. No debridement is required Integumentary (Hair, Skin) Wound #3 status is Open. Original cause of wound was Pressure Injury. The date acquired was: 03/09/2022. The wound has been in treatment 35 weeks. The wound is located on the Proximal,Midline Sacrum. The wound measures 1.5cm length x 0.5cm width x 0.7cm depth; 0.589cm^2 area and 0.412cm^3 volume. There is muscle and Fat Layer (Subcutaneous Tissue) exposed. There is a large amount of serosanguineous drainage noted. The wound margin is epibole. There is large (67-100%) red, hyper - granulation within the wound bed. There is a small (1-33%) amount of necrotic tissue within the wound bed. [name], [name] ([id number]) [id number] Page 6 of 7 Assessment Active Problems ICD-10 Pressure ulcer of sacral region, stage 4 Other pulmonary embolism without acute cor pulmonale Osteomyelitis of vertebra, sacral and sacrococcygeal region Multiple sclerosis Acute embolism and thrombosis of unspecified deep veins of unspecified lower extremity Long term (current) use of anticoagulants Plan 1. I  change the primary dressing to Prisma moistened with hydrogel 2. Attempting to stimulate stimulate more granulation tissue here. 3. She has been making good progress however I thought that the Dakin's wet to dry was not going to be as effective is stimulating granulation. 4. We talked about offloading etc. Electronic Signature(s) Signed: 01/06/2023 4:28:36 PM By: Baltazar Najjar MD Entered By: Baltazar Najjar on 01/06/2023 11:10:26 -------------------------------------------------------------------------------- SuperBill Details Patient Name: Date of Service: [name] Glen [name], [name] 01/06/2023 Medical Record Number: [id number] Patient Account Number: [id number] Date of Birth/Sex: Treating RN: 08/12/1960 (62 y.o. 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, sacral and sacrococcygeal region G35 Multiple sclerosis I82.409 Acute embolism and thrombosis of unspecified deep veins of unspecified lower extremity Z79.01 Long term (current) use of anticoagulants Physician Procedures : CPT4 Code Description Modifier [id number] - WC PHYS LEVEL 3 - EST PT ICD-10 Diagnosis Description L89.154 Pressure ulcer of sacral region, stage 4 G35 Multiple sclerosis [name], [name] ([id number]) [id number] Page Quantity: 1 7 of 7 Electronic Signature(s) Signed: 01/06/2023 4:28:36 PM By: Baltazar Najjar MD Entered By: Baltazar Najjar on 01/06/2023 11:10:50  WUJWJXB Other Clinician: Referring Provider: Treating Provider/Extender: RO BSO N, MICHA EL Verdis Prime, Radhika Weeks in Treatment: 35 Constitutional Patient is hypotensive.. Pulse regular and within target range for patient.Marland Kitchen Respirations regular, non-labored and within target range.. Temperature is normal and within the target range for the patient.Marland Kitchen appears in no distress. Notes Wound exam; sacrum there is  granulation tissue. It does not probe to bone however there is an undermining area distally in the wound probably half a centimeter. This may be an improvement from last time at which time circumferential undermining was noted. I see no evidence of infection. No debridement is required Electronic Signature(s) Signed: 01/06/2023 4:28:36 PM By: Baltazar Najjar MD Entered By: Baltazar Najjar on 01/06/2023 11:08:35 -------------------------------------------------------------------------------- Physician Orders Details Patient Name: Date of Service: [name] Glen [name], [name] 01/06/2023 1:15 PM Medical Record Number: [id number] Patient Account Number: [id number] Date of Birth/Sex: Treating RN: 1960/10/07 (62 y.o. 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 May utilize formulary equivalent dressing for wound treatment orders unless otherwise specified. Home Health Nurse may visit PRN to address patients wound care needs. - ADORATION May discontinue NPWT change to collagen and , hydrogel **Please direct any NON-WOUND related issues/requests for orders to patient's Primary Care Physician. **If current dressing causes regression in wound condition, may D/C ordered dressing product/s and apply Normal Saline  Moist Dressing daily until next Wound Healing Center or Other MD appointment. **Notify Wound Healing Center of regression in wound condition at [phone]. - Okay for patient to start back with physical therapy since she is no longer on the [facility] Negative Pressure Wound Therapy Wound #3 Proximal,Midline Sacrum Home Health Nurse may d/c VAC for s/s of increased infection, significant wound regression, or uncontrolled drainage. Notify Wound Healing Center at [phone]. - ADORATION Discontinue NPWT. Wound Treatment Wound #3 - Sacrum Wound Laterality: Midline, Proximal Cleanser: Vashe 5.8 (oz) 1 x Per Day/30 Days Discharge Instructions: Use vashe 5.8 (oz) as directed Prim Dressing: Gauze 1 x Per Day/30 Days ary Discharge Instructions: As directed: dry, moistened with saline or moistened with Dakins Solution Prim Dressing: Prisma 4.34 (in) (Home Health) 1 x Per Day/30 Days ary Discharge Instructions: Moisten w/normal saline or sterile water; Cover wound as directed. Do not remove from wound bed. Secondary Dressing: ABD Pad 5x9 (in/in) 1 x Per Day/30 Days Discharge Instructions: Cover with ABD pad Secured With: Paper Tape, 0.5x10 (in/yd) 1 x Per Day/30 Days Electronic Signature(s) Signed: 01/06/2023 4:28:36 PM By: Baltazar Najjar MD Signed: 01/08/2023 12:30:42 PM By: Midge Aver MSN RN CNS WTA Entered By: Midge Aver on 01/06/2023 11:22:11 -------------------------------------------------------------------------------- Problem List Details Patient Name: Date of Service: [name] Glen [name], [name] 01/06/2023 1:15 PM Medical Record Number: [id number] Patient Account Number: [id number] Date of Birth/Sex: Treating RN: 1961/03/04 (62 y.o. 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, sacral and sacrococcygeal region 05/20/2022 No Yes G35 Multiple sclerosis 05/06/2022 No Yes I82.409 Acute embolism and thrombosis of unspecified deep veins of unspecified 05/06/2022 No Yes lower extremity Z79.01 Long term (current) use of anticoagulants 05/06/2022 No Yes Inactive Problems ICD-10 Code Description Active Date Inactive Date L89.153 Pressure ulcer of sacral region, stage 3 05/06/2022 05/06/2022 Resolved Problems ICD-10 Code Description Active Date Resolved Date L89.620 Pressure ulcer of left heel, unstageable 05/06/2022 05/06/2022 L89.510 Pressure ulcer of right ankle, unstageable 09/02/2022 09/02/2022 L89.523 Pressure ulcer of left ankle, stage 3 09/02/2022 09/02/2022 Electronic Signature(s) Signed: 01/06/2023 4:28:36 PM By: Baltazar Najjar MD Entered By: Baltazar Najjar on 01/06/2023 11:06:12 -------------------------------------------------------------------------------- Progress Note Details Patient Name: Date of Service: [name] Glen [name], [name] 01/06/2023 1:15 PM Medical Record Number: [id number] Patient Account Number: [id number] Date of Birth/Sex: Treating RN: 1961-02-16 (62 y.o. [name] Primary Care Provider: Enid Baas Other Clinician: Referring Provider: Treating Provider/Extender: RO BSO Dorris Carnes, MICHA EL Verdis Prime, Radhika Weeks in Treatment: [address] [place], [place] ([id number]) [id number] Page 5 of 7 History of Present Illness (HPI) 05/06/2022 [name] is a 62 year old female with a past medical history of multiple sclerosis and PEs and DVT that presents to the clinic for sacral s wounds and Bilateral feet wounds. Patient  states that she obtained the sacral ulcers when she was hospitalized in November 2023 for bilateral pulmonary embolisms. She was in the ICU on a ventilator. Since discharge she has been using Dakin's wet-to-dry dressings to the area. Since her hospitalization she has been bedbound. However she is doing physical therapy. Prior to being hospitalized she was able to pull herself up and transfer from bed to chair on her own. Due to her MS she is not fully mobile. Unfortunately she states that the wound has gotten larger on her sacrum despite Wound care. Patient has home health. On 04/16/2022 she was admitted to the hospital for severe sepsis secondary to acute UTI and sacral cellulitis. She completed 5 days of IV cefepime and vancomycin. She was not discharged with oral antibiotics. She states she developed the bilateral feet wounds at that time. She states she has bunny boots however she is not wearing them. She currently denies systemic signs of infection. 2/7; patient presents for follow-up. She had a bone biopsy done at last clinic visit that showed fragments of inflamed granulation tissue, necrotic material and bone with acute osteomyelitis. Culture grew Proteus mirabilis and Klebsiella pneumoniae. Patient is scheduled to see infectious disease next week. For now she has been doing Dakin's wet-to-dry dressings to the sacrum. She is not able to obtain Santyl and has been keeping the left heel covered. T the right foot where o there was a previous wound with scab however this has healed. This has remained closed. No open wound noted. She states over the past week she has had chills but no fevers, nausea/vomiting or purulent drainage. 2/21; patient presents for follow-up. She saw Dr. Joylene Draft on 2/15. 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 Today she had feces impacted in the wound and throughout the tunneling. We discussed a diverting colostomy to address this issue. She was  agreeable with a referral to general surgery. She currently denies systemic signs of

## 2023-01-08 NOTE — Progress Notes (Signed)
[name], [name] ([id number]) [id number] Page 1 of 9 Visit Report for 01/06/2023 Arrival Information Details Patient Name: Date of Service: [name] STRA ND, [name] 01/06/2023 1:15 PM Medical Record Number: [id number] Patient Account Number: [id number] Date of Birth/Sex: Treating RN: [birth year] (62 y.o. [name] Primary Care [name]: [name] Other Clinician: Referring [name]: Treating [name]/Extender: [name] [name], [name] in Treatment: 35 Visit Information History Since Last Visit Added or deleted any medications: No Patient Arrived: Wheel Chair Any new allergies or adverse reactions: No Arrival Time: 13:52 Has Dressing in Place as Prescribed: Yes Accompanied By: boyfriend Pain Present Now: No Transfer Assistance: Nurse, adult Patient Identification Verified: Yes Secondary Verification Process Completed: Yes Patient Requires Transmission-Based Precautions: No Patient Has Alerts: Yes Patient Alerts: Patient on Blood Thinner Not Diabetic Xarelto Electronic Signature(s) Signed: 01/08/2023 12:30:42 PM By: Midge Aver MSN RN CNS WTA Entered By: Midge Aver on 01/06/2023 10:53:02 -------------------------------------------------------------------------------- Clinic Level of Care Assessment Details Patient Name: Date of Service: [name] ND, [name] 01/06/2023 1:15 PM Medical Record Number: [id number] Patient Account Number: [id number] Date of Birth/Sex: Treating RN: 30-Jan-1961 (62 y.o. [name] Primary Care Bentlee Benningfield: [name] Other Clinician: Referring [name]: Treating Karuna Balducci/Extender: [name] [name], [name] in Treatment: 35 Clinic Level of Care Assessment Items TOOL 4 Quantity Score X- 1 0 Use when only an EandM is performed on FOLLOW-UP visit ASSESSMENTS - Nursing Assessment / Reassessment X- 1 10 Reassessment of Co-morbidities (includes updates  in patient status) X- 1 5 Reassessment of Adherence to Treatment Plan ASSESSMENTS - Wound and Skin A ssessment / Reassessment X - Simple Wound Assessment / Reassessment - one wound 1 5 [name], [name] ([id number]) [id number] Page 2 of 9 []  - 0 Complex Wound Assessment / Reassessment - multiple wounds []  - 0 Dermatologic / Skin Assessment (not related to wound area) ASSESSMENTS - Focused Assessment []  - 0 Circumferential Edema Measurements - multi extremities []  - 0 Nutritional Assessment / Counseling / Intervention []  - 0 Lower Extremity Assessment (monofilament, tuning fork, pulses) []  - 0 Peripheral Arterial Disease Assessment (using hand held doppler) ASSESSMENTS - Ostomy and/or Continence Assessment and Care []  - 0 Incontinence Assessment and Management []  - 0 Ostomy Care Assessment and Management (repouching, etc.) PROCESS - Coordination of Care X - Simple Patient / Family Education for ongoing care 1 15 []  - 0 Complex (extensive) Patient / Family Education for ongoing care X- 1 10 Staff obtains Chiropractor, Records, T Results / Process Orders est []  - 0 Staff telephones HHA, Nursing Homes / Clarify orders / etc []  - 0 Routine Transfer to another Facility (non-emergent condition) []  - 0 Routine Hospital Admission (non-emergent condition) []  - 0 New Admissions / Manufacturing engineer / Ordering NPWT Apligraf, etc. , []  - 0 Emergency Hospital Admission (emergent condition) X- 1 10 Simple Discharge Coordination []  - 0 Complex (extensive) Discharge Coordination PROCESS - Special Needs []  - 0 Pediatric / Minor Patient Management []  - 0 Isolation Patient Management []  - 0 Hearing / Language / Visual special needs []  - 0 Assessment of Community assistance (transportation, D/C planning, etc.) []  - 0 Additional assistance / Altered mentation []  - 0 Support Surface(s) Assessment (bed, cushion, seat, etc.) INTERVENTIONS - Wound  Cleansing / Measurement X - Simple Wound Cleansing - one wound 1 5 []  - 0 Complex Wound Cleansing - multiple wounds X- 1 5 Wound Imaging (photographs - any number of wounds) []  -  V A NNO STRA ND, [name] 01/06/2023 1:15 PM Medical Record Number: [id number] Patient Account Number: [id number] Date of Birth/Sex: Treating RN: 16-Jan-1961 (62 y.o. [name] Primary Care Cesario Weidinger: [name] Other Clinician: Referring Kirat Mezquita: Treating Citlally Captain/Extender: [name] [name], [name] in Treatment: 35 Wound Status Wound Number: 3 Primary Etiology: Pressure Ulcer Wound Location: Proximal, Midline Sacrum Wound Status: Open Wounding Event: Pressure Injury Comorbid History: Angina, Hypotension Date  Acquired: 03/09/2022 [name] Of Treatment: 35 Clustered Wound: No Photos Wound Measurements Length: (cm) 1.5 Width: (cm) 0.5 Depth: (cm) 0.7 Area: (cm) 0.589 Volume: (cm) 0.412 % Reduction in Area: 97.5% % Reduction in Volume: 99.7% Epithelialization: None Wound Description Classification: Category/Stage IV [name], [name] ([id number]) Wound Margin: Epibole Exudate Amount: Large Exudate Type: Serosanguineous Exudate Color: red, brown Foul Odor After Cleansing: No [id number] Page 8 of 9 Slough/Fibrino Yes Wound Bed Granulation Amount: Large (67-100%) Exposed Structure Granulation Quality: Red, Hyper-granulation Fascia Exposed: No Necrotic Amount: Small (1-33%) Fat Layer (Subcutaneous Tissue) Exposed: Yes Tendon Exposed: No Muscle Exposed: Yes Necrosis of Muscle: No Joint Exposed: No Bone Exposed: No Treatment Notes Wound #3 (Sacrum) Wound Laterality: Midline, Proximal Cleanser Vashe 5.8 (oz) Discharge Instruction: Use vashe 5.8 (oz) as directed Peri-Wound Care Topical Primary Dressing Gauze Discharge Instruction: As directed: dry, moistened with saline or moistened with Dakins Solution Prisma 4.34 (in) Discharge Instruction: Moisten w/normal saline or sterile water; Cover wound as directed. Do not remove from wound bed. Secondary Dressing ABD Pad 5x9 (in/in) Discharge Instruction: Cover with ABD pad Secured With Paper Tape, 0.5x10 (in/yd) Compression Wrap Compression Stockings Add-Ons Electronic Signature(s) Signed: 01/08/2023 12:30:42 PM By: Midge Aver MSN RN CNS WTA Entered By: Midge Aver on 01/06/2023 10:54:24 -------------------------------------------------------------------------------- Vitals Details Patient Name: Date of Service: [name] ND, [name] 01/06/2023 1:15 PM Medical Record Number: [id number] Patient Account Number: [id number] Date of Birth/Sex: Treating RN: January 09, 1961 (62 y.o. [name] Primary Care Mathis Cashman: [name] Other Clinician: Referring Nikala Walsworth: Treating Lamari Beckles/Extender: [name] [name], [name] in Treatment: 35 Vital Signs Time Taken: 01:45 Temperature (F): 98.1 [place], [place] ([id number]) [id number] Page 9 of 9 Pulse (bpm): 102 Respiratory Rate (breaths/min): 18 Blood Pressure (mmHg): 94/60 Reference Range: 80 - 120 mg / dl Electronic Signature(s) Signed: 01/08/2023 12:30:42 PM By: Midge Aver MSN RN CNS WTA Entered By: Midge Aver on 01/06/2023 10:53:26  [name], [name] ([id number]) [id number] Page 1 of 9 Visit Report for 01/06/2023 Arrival Information Details Patient Name: Date of Service: [name] STRA ND, [name] 01/06/2023 1:15 PM Medical Record Number: [id number] Patient Account Number: [id number] Date of Birth/Sex: Treating RN: [birth year] (62 y.o. [name] Primary Care [name]: [name] Other Clinician: Referring [name]: Treating [name]/Extender: [name] [name], [name] in Treatment: 35 Visit Information History Since Last Visit Added or deleted any medications: No Patient Arrived: Wheel Chair Any new allergies or adverse reactions: No Arrival Time: 13:52 Has Dressing in Place as Prescribed: Yes Accompanied By: boyfriend Pain Present Now: No Transfer Assistance: Nurse, adult Patient Identification Verified: Yes Secondary Verification Process Completed: Yes Patient Requires Transmission-Based Precautions: No Patient Has Alerts: Yes Patient Alerts: Patient on Blood Thinner Not Diabetic Xarelto Electronic Signature(s) Signed: 01/08/2023 12:30:42 PM By: Midge Aver MSN RN CNS WTA Entered By: Midge Aver on 01/06/2023 10:53:02 -------------------------------------------------------------------------------- Clinic Level of Care Assessment Details Patient Name: Date of Service: [name] ND, [name] 01/06/2023 1:15 PM Medical Record Number: [id number] Patient Account Number: [id number] Date of Birth/Sex: Treating RN: 30-Jan-1961 (62 y.o. [name] Primary Care Bentlee Benningfield: [name] Other Clinician: Referring [name]: Treating Karuna Balducci/Extender: [name] [name], [name] in Treatment: 35 Clinic Level of Care Assessment Items TOOL 4 Quantity Score X- 1 0 Use when only an EandM is performed on FOLLOW-UP visit ASSESSMENTS - Nursing Assessment / Reassessment X- 1 10 Reassessment of Co-morbidities (includes updates  in patient status) X- 1 5 Reassessment of Adherence to Treatment Plan ASSESSMENTS - Wound and Skin A ssessment / Reassessment X - Simple Wound Assessment / Reassessment - one wound 1 5 [name], [name] ([id number]) [id number] Page 2 of 9 []  - 0 Complex Wound Assessment / Reassessment - multiple wounds []  - 0 Dermatologic / Skin Assessment (not related to wound area) ASSESSMENTS - Focused Assessment []  - 0 Circumferential Edema Measurements - multi extremities []  - 0 Nutritional Assessment / Counseling / Intervention []  - 0 Lower Extremity Assessment (monofilament, tuning fork, pulses) []  - 0 Peripheral Arterial Disease Assessment (using hand held doppler) ASSESSMENTS - Ostomy and/or Continence Assessment and Care []  - 0 Incontinence Assessment and Management []  - 0 Ostomy Care Assessment and Management (repouching, etc.) PROCESS - Coordination of Care X - Simple Patient / Family Education for ongoing care 1 15 []  - 0 Complex (extensive) Patient / Family Education for ongoing care X- 1 10 Staff obtains Chiropractor, Records, T Results / Process Orders est []  - 0 Staff telephones HHA, Nursing Homes / Clarify orders / etc []  - 0 Routine Transfer to another Facility (non-emergent condition) []  - 0 Routine Hospital Admission (non-emergent condition) []  - 0 New Admissions / Manufacturing engineer / Ordering NPWT Apligraf, etc. , []  - 0 Emergency Hospital Admission (emergent condition) X- 1 10 Simple Discharge Coordination []  - 0 Complex (extensive) Discharge Coordination PROCESS - Special Needs []  - 0 Pediatric / Minor Patient Management []  - 0 Isolation Patient Management []  - 0 Hearing / Language / Visual special needs []  - 0 Assessment of Community assistance (transportation, D/C planning, etc.) []  - 0 Additional assistance / Altered mentation []  - 0 Support Surface(s) Assessment (bed, cushion, seat, etc.) INTERVENTIONS - Wound  Cleansing / Measurement X - Simple Wound Cleansing - one wound 1 5 []  - 0 Complex Wound Cleansing - multiple wounds X- 1 5 Wound Imaging (photographs - any number of wounds) []  -  [name], [name] ([id number]) [id number] Page 1 of 9 Visit Report for 01/06/2023 Arrival Information Details Patient Name: Date of Service: [name] STRA ND, [name] 01/06/2023 1:15 PM Medical Record Number: [id number] Patient Account Number: [id number] Date of Birth/Sex: Treating RN: [birth year] (62 y.o. [name] Primary Care [name]: [name] Other Clinician: Referring [name]: Treating [name]/Extender: [name] [name], [name] in Treatment: 35 Visit Information History Since Last Visit Added or deleted any medications: No Patient Arrived: Wheel Chair Any new allergies or adverse reactions: No Arrival Time: 13:52 Has Dressing in Place as Prescribed: Yes Accompanied By: boyfriend Pain Present Now: No Transfer Assistance: Nurse, adult Patient Identification Verified: Yes Secondary Verification Process Completed: Yes Patient Requires Transmission-Based Precautions: No Patient Has Alerts: Yes Patient Alerts: Patient on Blood Thinner Not Diabetic Xarelto Electronic Signature(s) Signed: 01/08/2023 12:30:42 PM By: Midge Aver MSN RN CNS WTA Entered By: Midge Aver on 01/06/2023 10:53:02 -------------------------------------------------------------------------------- Clinic Level of Care Assessment Details Patient Name: Date of Service: [name] ND, [name] 01/06/2023 1:15 PM Medical Record Number: [id number] Patient Account Number: [id number] Date of Birth/Sex: Treating RN: 30-Jan-1961 (62 y.o. [name] Primary Care Bentlee Benningfield: [name] Other Clinician: Referring [name]: Treating Karuna Balducci/Extender: [name] [name], [name] in Treatment: 35 Clinic Level of Care Assessment Items TOOL 4 Quantity Score X- 1 0 Use when only an EandM is performed on FOLLOW-UP visit ASSESSMENTS - Nursing Assessment / Reassessment X- 1 10 Reassessment of Co-morbidities (includes updates  in patient status) X- 1 5 Reassessment of Adherence to Treatment Plan ASSESSMENTS - Wound and Skin A ssessment / Reassessment X - Simple Wound Assessment / Reassessment - one wound 1 5 [name], [name] ([id number]) [id number] Page 2 of 9 []  - 0 Complex Wound Assessment / Reassessment - multiple wounds []  - 0 Dermatologic / Skin Assessment (not related to wound area) ASSESSMENTS - Focused Assessment []  - 0 Circumferential Edema Measurements - multi extremities []  - 0 Nutritional Assessment / Counseling / Intervention []  - 0 Lower Extremity Assessment (monofilament, tuning fork, pulses) []  - 0 Peripheral Arterial Disease Assessment (using hand held doppler) ASSESSMENTS - Ostomy and/or Continence Assessment and Care []  - 0 Incontinence Assessment and Management []  - 0 Ostomy Care Assessment and Management (repouching, etc.) PROCESS - Coordination of Care X - Simple Patient / Family Education for ongoing care 1 15 []  - 0 Complex (extensive) Patient / Family Education for ongoing care X- 1 10 Staff obtains Chiropractor, Records, T Results / Process Orders est []  - 0 Staff telephones HHA, Nursing Homes / Clarify orders / etc []  - 0 Routine Transfer to another Facility (non-emergent condition) []  - 0 Routine Hospital Admission (non-emergent condition) []  - 0 New Admissions / Manufacturing engineer / Ordering NPWT Apligraf, etc. , []  - 0 Emergency Hospital Admission (emergent condition) X- 1 10 Simple Discharge Coordination []  - 0 Complex (extensive) Discharge Coordination PROCESS - Special Needs []  - 0 Pediatric / Minor Patient Management []  - 0 Isolation Patient Management []  - 0 Hearing / Language / Visual special needs []  - 0 Assessment of Community assistance (transportation, D/C planning, etc.) []  - 0 Additional assistance / Altered mentation []  - 0 Support Surface(s) Assessment (bed, cushion, seat, etc.) INTERVENTIONS - Wound  Cleansing / Measurement X - Simple Wound Cleansing - one wound 1 5 []  - 0 Complex Wound Cleansing - multiple wounds X- 1 5 Wound Imaging (photographs - any number of wounds) []  -

## 2023-01-20 ENCOUNTER — Encounter: Payer: Medicare Other | Attending: Internal Medicine | Admitting: Internal Medicine

## 2023-01-20 DIAGNOSIS — L89523 Pressure ulcer of left ankle, stage 3: Secondary | ICD-10-CM | POA: Insufficient documentation

## 2023-01-20 DIAGNOSIS — Z86711 Personal history of pulmonary embolism: Secondary | ICD-10-CM | POA: Insufficient documentation

## 2023-01-20 DIAGNOSIS — G35 Multiple sclerosis: Secondary | ICD-10-CM | POA: Insufficient documentation

## 2023-01-20 DIAGNOSIS — L89620 Pressure ulcer of left heel, unstageable: Secondary | ICD-10-CM | POA: Diagnosis present

## 2023-01-20 DIAGNOSIS — L89153 Pressure ulcer of sacral region, stage 3: Secondary | ICD-10-CM | POA: Diagnosis not present

## 2023-01-20 DIAGNOSIS — Z7901 Long term (current) use of anticoagulants: Secondary | ICD-10-CM | POA: Diagnosis not present

## 2023-01-20 DIAGNOSIS — L89154 Pressure ulcer of sacral region, stage 4: Secondary | ICD-10-CM | POA: Insufficient documentation

## 2023-01-20 DIAGNOSIS — Z7401 Bed confinement status: Secondary | ICD-10-CM | POA: Diagnosis not present

## 2023-01-20 DIAGNOSIS — Z86718 Personal history of other venous thrombosis and embolism: Secondary | ICD-10-CM | POA: Diagnosis not present

## 2023-01-20 DIAGNOSIS — L89510 Pressure ulcer of right ankle, unstageable: Secondary | ICD-10-CM | POA: Insufficient documentation

## 2023-01-20 NOTE — Progress Notes (Signed)
[name], [name] ([id number]) [id number] Page 1 of 8 Visit Report for 01/20/2023 HPI Details Patient Name: Date of Service: [name] ND, [name] 01/20/2023 3:15 PM Medical Record Number: [id number] Patient Account Number: [id number] Date of Birth/Sex: Treating RN: 09/01/1960 (62 y.o. [name] Primary Care Provider: Enid Baas Other Clinician: Referring Provider: Treating Provider/Extender: RO BSO N, MICHA EL Verdis Prime, [name] in Treatment: 37 History of Present Illness HPI Description: 05/06/2022 [name] is a 62 year old female with a past medical history of multiple sclerosis and PEs and DVT that presents to the clinic for sacral s wounds and Bilateral feet wounds. Patient states that she obtained the sacral ulcers when she was hospitalized in November 2023 for bilateral pulmonary embolisms. She was in the ICU on a ventilator. Since discharge she has been using Dakin's wet-to-dry dressings to the area. Since her hospitalization she has been bedbound. However she is doing physical therapy. Prior to being hospitalized she was able to pull herself up and transfer from bed to chair on her own. Due to her MS she is not fully mobile. Unfortunately she states that the wound has gotten larger on her sacrum despite Wound care. Patient has home health. On 04/16/2022 she was admitted to the hospital for severe sepsis secondary to acute UTI and sacral cellulitis. She completed 5 days of IV cefepime and vancomycin. She was not discharged with oral antibiotics. She states she developed the bilateral feet wounds at that time. She states she has bunny boots however she is not wearing them. She currently denies systemic signs of infection. 2/7; patient presents for follow-up. She had a bone biopsy done at last clinic visit that showed fragments of inflamed granulation tissue, necrotic material and bone with acute osteomyelitis.  Culture grew Proteus mirabilis and Klebsiella pneumoniae. Patient is scheduled to see infectious disease next week. For now she has been doing Dakin's wet-to-dry dressings to the sacrum. She is not able to obtain Santyl and has been keeping the left heel covered. T the right foot where o there was a previous wound with scab however this has healed. This has remained closed. No open wound noted. She states over the past week she has had chills but no fevers, nausea/vomiting or purulent drainage. 2/21; patient presents for follow-up. She saw Dr. Joylene Draft on 2/15. 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 Today she had feces impacted in the wound and throughout the tunneling. We discussed a diverting colostomy to address this issue. She was agreeable with a referral to general surgery. She currently denies systemic signs of infection. She has been using Medihoney and Hydrofera Blue to the left heel wound. She has been using her Prevalon boots. 3/20; patient presents for follow-up. She has been using Dakin's wet-to-dry dressings to the sacral wound. She has developed a new wound to her right lateral heel. She is using Medihoney and Hydrofera Blue to the left heel. She is currently taking Levaquin to complete 6 [name]. She follows with infectious disease for this. She saw general surgery at [facility] to discuss potentially doing a diverting colostomy however per patient's surgeon did not recommend this. 4/17; patient presents for follow-up. Has been using Dakin's wet-to-dry dressings to the sacral wound. She has been using Medihoney and Hydrofera Blue to the heel wounds. She states she is using her bunny boots for offloading the heels. She has not heard from plastic surgery for consultation. We gave her the number to call to follow this  Post procedure Diagnosis Wound #3: Same as Pre-Procedure Plan Home Health: CONTINUE Home Health for wound care. May utilize formulary equivalent dressing for wound treatment orders unless otherwise specified. Home Health Nurse may visit PRN to address patients wound care needs. - ADORATION May discontinue NPWT change to collagen and hydrogel , **Please  direct any NON-WOUND related issues/requests for orders to patient's Primary Care Physician. **If current dressing causes regression in wound condition, may D/C ordered dressing product/s and apply Normal Saline Moist Dressing daily until next Wound Healing Center or Other MD appointment. **Notify Wound Healing Center of regression in wound condition at [phone]. - Okay for patient to start back with physical therapy since she is no longer on the [facility] Negative Pressure Wound Therapy: Wound #3 Proximal,Midline Sacrum: Home Health Nurse may d/c VAC for s/s of increased infection, significant wound regression, or uncontrolled drainage. Notify Wound Healing Center at [phone]. - ADORATION Discontinue NPWT . LOVE, CHOWNING ([id number]) [id number] Page 7 of 8 WOUND #3: - Sacrum Wound Laterality: Midline, Proximal Cleanser: Vashe 5.8 (oz) 1 x Per Day/30 Days Discharge Instructions: Use vashe 5.8 (oz) as directed Prim Dressing: Gauze 1 x Per Day/30 Days ary Discharge Instructions: As directed: dry, moistened with saline or moistened with Dakins Solution Prim Dressing: Prisma 4.34 (in) (Home Health) 1 x Per Day/30 Days ary Discharge Instructions: Moisten w/normal saline or sterile water; Cover wound as directed. Do not remove from wound bed. Secondary Dressing: ABD Pad 5x9 (in/in) 1 x Per Day/30 Days Discharge Instructions: Cover with ABD pad Secured With: Paper Tape, 0.5x10 (in/yd) 1 x Per Day/30 Days 1. I used silver nitrate to brought back down some protuberant tissue. This did not look ominous 2. Still using silver collagen in this area 3. Remarkable improvement. They are offloading this is carefully as they can although it sounds like she is up in a wheelchair excessively Electronic Signature(s) Signed: 01/20/2023 4:50:17 PM By: Baltazar Najjar MD Entered By: Baltazar Najjar on 01/20/2023  16:17:47 -------------------------------------------------------------------------------- SuperBill Details Patient Name: Date of Service: [name] ND, [name] 01/20/2023 Medical Record Number: [id number] Patient Account Number: [id number] Date of Birth/Sex: Treating RN: Jul 14, 1960 (62 y.o. [name] Primary Care Provider: Enid Baas Other Clinician: Referring Provider: Treating Provider/Extender: RO BSO N, MICHA EL Verdis Prime, [name] in Treatment: 37 Diagnosis Coding ICD-10 Codes Code Description L89.154 Pressure ulcer of sacral region, stage 4 I26.99 Other pulmonary embolism without acute cor pulmonale 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, sacral and sacrococcygeal region G35 Multiple sclerosis I82.409 Acute embolism and thrombosis of unspecified deep veins of unspecified lower extremity Z79.01 Long term (current) use of anticoagulants Facility Procedures : CPT4 Code: 41324401 Description: 17250 - CHEM CAUT GRANULATION TISS ICD-10 Diagnosis Description L89.154 Pressure ulcer of sacral region, stage 4 Modifier: Quantity: 1 Physician Procedures : CPT4 Code Description Modifier [id number] 17250 - WC PHYS CHEM CAUT GRAN TISSUE ICD-10 Diagnosis Description L89.154 Pressure ulcer of sacral region, stage 4 Quantity: 1 Electronic Signature(s) Signed: 01/20/2023 4:50:17 PM By: Baltazar Najjar MD Mesilla, [name] ([id number]) [id number] Page 8 of 8 Entered By: Baltazar Najjar on 01/20/2023 16:18:20  much pressure on the lower coccyx area. Electronic Signature(s) Signed: 01/20/2023 4:50:17 PM By: Baltazar Najjar MD Entered By: Baltazar Najjar on 01/20/2023 16:13:26 -------------------------------------------------------------------------------- Gaynelle Adu TISS Details Patient Name: Date of Service: [name] ND, [name] 01/20/2023 3:15 PM Medical Record Number: [id number] Patient Account Number: [id number] Date of Birth/Sex: Treating RN: 1960-07-28 (62 y.o. [name] Primary Care Provider: Enid Baas Other Clinician: Referring  Provider: Treating Provider/Extender: RO BSO N, MICHA EL Verdis Prime, [name] in Treatment: 37 Procedure Performed for: Wound #3 Proximal,Midline Sacrum Performed By: Physician Maxwell Caul, MD Post Procedure Diagnosis Same as Pre-procedure Electronic Signature(s) Signed: 01/20/2023 4:56:06 PM By: Midge Aver MSN RN CNS WTA Entered By: Midge Aver on 01/20/2023 15:55:10 -------------------------------------------------------------------------------- Physical Exam Details Patient Name: Date of Service: [name] ND, [name] 01/20/2023 3:15 PM Medical Record Number: [id number] Patient Account Number: [id number] Date of Birth/Sex: Treating RN: 08-27-1960 (62 y.o. [name] Primary Care Provider: Enid Baas Other Clinician: Referring Provider: Treating Provider/Extender: RO BSO N, MICHA EL Verdis Prime, [name] in Treatment: 37 Constitutional Sitting or standing Blood Pressure is within target range for patient.. Pulse regular and within target range for patient.Marland Kitchen Respirations regular, non-labored and within target range.. Temperature is normal and within the target range for the patient.Marland Kitchen appears in no distress. Notes ELLIETT, GUARISCO ([id number]) [id number] Page 3 of 8 Wound exam; lower sacrum there is granulation tissue that this does not probe to bone. Undermining distally seems better. Granulation looks healthy. She has an area protuberant tissue which I do not think is helping the situation. I used silver nitrate to knock this back. There was no suggestion of a additional problem area. Electronic Signature(s) Signed: 01/20/2023 4:50:17 PM By: Baltazar Najjar MD Entered By: Baltazar Najjar on 01/20/2023 16:14:43 -------------------------------------------------------------------------------- Physician Orders Details Patient Name: Date of Service: [name] ND, [name] 01/20/2023 3:15 PM Medical Record  Number: [id number] Patient Account Number: [id number] Date of Birth/Sex: Treating RN: Mar 21, 1961 (62 y.o. [name] Primary Care Provider: Enid Baas Other Clinician: Referring Provider: Treating Provider/Extender: RO BSO N, MICHA EL Verdis Prime, [name] in Treatment: 5 Verbal / Phone Orders: No Diagnosis Coding Home Health CONTINUE Home Health for wound care. May utilize formulary equivalent dressing for wound treatment orders unless otherwise specified. Home Health Nurse may visit PRN to address patients wound care needs. - ADORATION May discontinue NPWT change to collagen and , hydrogel **Please direct any NON-WOUND related issues/requests for orders to patient's Primary Care Physician. **If current dressing causes regression in wound condition, may D/C ordered dressing product/s and apply Normal Saline Moist Dressing daily until next Wound Healing Center or Other MD appointment. **Notify Wound Healing Center of regression in wound condition at [phone]. - Okay for patient to start back with physical therapy since she is no longer on the [facility] Negative Pressure Wound Therapy Wound #3 Proximal,Midline Sacrum Home Health Nurse may d/c VAC for s/s of increased infection, significant wound regression, or uncontrolled drainage. Notify Wound Healing Center at [phone]. - ADORATION Discontinue NPWT. Wound Treatment Wound #3 - Sacrum Wound Laterality: Midline, Proximal Cleanser: Vashe 5.8 (oz) 1 x Per Day/30 Days Discharge Instructions: Use vashe 5.8 (oz) as directed Prim Dressing: Gauze 1 x Per Day/30 Days ary Discharge Instructions: As directed: dry, moistened with saline or moistened with Dakins Solution Prim Dressing: Prisma 4.34 (in) (Home Health) 1 x Per Day/30 Days ary Discharge Instructions: Moisten w/normal saline or sterile water; Cover  Post procedure Diagnosis Wound #3: Same as Pre-Procedure Plan Home Health: CONTINUE Home Health for wound care. May utilize formulary equivalent dressing for wound treatment orders unless otherwise specified. Home Health Nurse may visit PRN to address patients wound care needs. - ADORATION May discontinue NPWT change to collagen and hydrogel , **Please  direct any NON-WOUND related issues/requests for orders to patient's Primary Care Physician. **If current dressing causes regression in wound condition, may D/C ordered dressing product/s and apply Normal Saline Moist Dressing daily until next Wound Healing Center or Other MD appointment. **Notify Wound Healing Center of regression in wound condition at [phone]. - Okay for patient to start back with physical therapy since she is no longer on the [facility] Negative Pressure Wound Therapy: Wound #3 Proximal,Midline Sacrum: Home Health Nurse may d/c VAC for s/s of increased infection, significant wound regression, or uncontrolled drainage. Notify Wound Healing Center at [phone]. - ADORATION Discontinue NPWT . LOVE, CHOWNING ([id number]) [id number] Page 7 of 8 WOUND #3: - Sacrum Wound Laterality: Midline, Proximal Cleanser: Vashe 5.8 (oz) 1 x Per Day/30 Days Discharge Instructions: Use vashe 5.8 (oz) as directed Prim Dressing: Gauze 1 x Per Day/30 Days ary Discharge Instructions: As directed: dry, moistened with saline or moistened with Dakins Solution Prim Dressing: Prisma 4.34 (in) (Home Health) 1 x Per Day/30 Days ary Discharge Instructions: Moisten w/normal saline or sterile water; Cover wound as directed. Do not remove from wound bed. Secondary Dressing: ABD Pad 5x9 (in/in) 1 x Per Day/30 Days Discharge Instructions: Cover with ABD pad Secured With: Paper Tape, 0.5x10 (in/yd) 1 x Per Day/30 Days 1. I used silver nitrate to brought back down some protuberant tissue. This did not look ominous 2. Still using silver collagen in this area 3. Remarkable improvement. They are offloading this is carefully as they can although it sounds like she is up in a wheelchair excessively Electronic Signature(s) Signed: 01/20/2023 4:50:17 PM By: Baltazar Najjar MD Entered By: Baltazar Najjar on 01/20/2023  16:17:47 -------------------------------------------------------------------------------- SuperBill Details Patient Name: Date of Service: [name] ND, [name] 01/20/2023 Medical Record Number: [id number] Patient Account Number: [id number] Date of Birth/Sex: Treating RN: Jul 14, 1960 (62 y.o. [name] Primary Care Provider: Enid Baas Other Clinician: Referring Provider: Treating Provider/Extender: RO BSO N, MICHA EL Verdis Prime, [name] in Treatment: 37 Diagnosis Coding ICD-10 Codes Code Description L89.154 Pressure ulcer of sacral region, stage 4 I26.99 Other pulmonary embolism without acute cor pulmonale 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, sacral and sacrococcygeal region G35 Multiple sclerosis I82.409 Acute embolism and thrombosis of unspecified deep veins of unspecified lower extremity Z79.01 Long term (current) use of anticoagulants Facility Procedures : CPT4 Code: 41324401 Description: 17250 - CHEM CAUT GRANULATION TISS ICD-10 Diagnosis Description L89.154 Pressure ulcer of sacral region, stage 4 Modifier: Quantity: 1 Physician Procedures : CPT4 Code Description Modifier [id number] 17250 - WC PHYS CHEM CAUT GRAN TISSUE ICD-10 Diagnosis Description L89.154 Pressure ulcer of sacral region, stage 4 Quantity: 1 Electronic Signature(s) Signed: 01/20/2023 4:50:17 PM By: Baltazar Najjar MD Mesilla, [name] ([id number]) [id number] Page 8 of 8 Entered By: Baltazar Najjar on 01/20/2023 16:18:20  Post procedure Diagnosis Wound #3: Same as Pre-Procedure Plan Home Health: CONTINUE Home Health for wound care. May utilize formulary equivalent dressing for wound treatment orders unless otherwise specified. Home Health Nurse may visit PRN to address patients wound care needs. - ADORATION May discontinue NPWT change to collagen and hydrogel , **Please  direct any NON-WOUND related issues/requests for orders to patient's Primary Care Physician. **If current dressing causes regression in wound condition, may D/C ordered dressing product/s and apply Normal Saline Moist Dressing daily until next Wound Healing Center or Other MD appointment. **Notify Wound Healing Center of regression in wound condition at [phone]. - Okay for patient to start back with physical therapy since she is no longer on the [facility] Negative Pressure Wound Therapy: Wound #3 Proximal,Midline Sacrum: Home Health Nurse may d/c VAC for s/s of increased infection, significant wound regression, or uncontrolled drainage. Notify Wound Healing Center at [phone]. - ADORATION Discontinue NPWT . LOVE, CHOWNING ([id number]) [id number] Page 7 of 8 WOUND #3: - Sacrum Wound Laterality: Midline, Proximal Cleanser: Vashe 5.8 (oz) 1 x Per Day/30 Days Discharge Instructions: Use vashe 5.8 (oz) as directed Prim Dressing: Gauze 1 x Per Day/30 Days ary Discharge Instructions: As directed: dry, moistened with saline or moistened with Dakins Solution Prim Dressing: Prisma 4.34 (in) (Home Health) 1 x Per Day/30 Days ary Discharge Instructions: Moisten w/normal saline or sterile water; Cover wound as directed. Do not remove from wound bed. Secondary Dressing: ABD Pad 5x9 (in/in) 1 x Per Day/30 Days Discharge Instructions: Cover with ABD pad Secured With: Paper Tape, 0.5x10 (in/yd) 1 x Per Day/30 Days 1. I used silver nitrate to brought back down some protuberant tissue. This did not look ominous 2. Still using silver collagen in this area 3. Remarkable improvement. They are offloading this is carefully as they can although it sounds like she is up in a wheelchair excessively Electronic Signature(s) Signed: 01/20/2023 4:50:17 PM By: Baltazar Najjar MD Entered By: Baltazar Najjar on 01/20/2023  16:17:47 -------------------------------------------------------------------------------- SuperBill Details Patient Name: Date of Service: [name] ND, [name] 01/20/2023 Medical Record Number: [id number] Patient Account Number: [id number] Date of Birth/Sex: Treating RN: Jul 14, 1960 (62 y.o. [name] Primary Care Provider: Enid Baas Other Clinician: Referring Provider: Treating Provider/Extender: RO BSO N, MICHA EL Verdis Prime, [name] in Treatment: 37 Diagnosis Coding ICD-10 Codes Code Description L89.154 Pressure ulcer of sacral region, stage 4 I26.99 Other pulmonary embolism without acute cor pulmonale 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, sacral and sacrococcygeal region G35 Multiple sclerosis I82.409 Acute embolism and thrombosis of unspecified deep veins of unspecified lower extremity Z79.01 Long term (current) use of anticoagulants Facility Procedures : CPT4 Code: 41324401 Description: 17250 - CHEM CAUT GRANULATION TISS ICD-10 Diagnosis Description L89.154 Pressure ulcer of sacral region, stage 4 Modifier: Quantity: 1 Physician Procedures : CPT4 Code Description Modifier [id number] 17250 - WC PHYS CHEM CAUT GRAN TISSUE ICD-10 Diagnosis Description L89.154 Pressure ulcer of sacral region, stage 4 Quantity: 1 Electronic Signature(s) Signed: 01/20/2023 4:50:17 PM By: Baltazar Najjar MD Mesilla, [name] ([id number]) [id number] Page 8 of 8 Entered By: Baltazar Najjar on 01/20/2023 16:18:20  much pressure on the lower coccyx area. Electronic Signature(s) Signed: 01/20/2023 4:50:17 PM By: Baltazar Najjar MD Entered By: Baltazar Najjar on 01/20/2023 16:13:26 -------------------------------------------------------------------------------- Gaynelle Adu TISS Details Patient Name: Date of Service: [name] ND, [name] 01/20/2023 3:15 PM Medical Record Number: [id number] Patient Account Number: [id number] Date of Birth/Sex: Treating RN: 1960-07-28 (62 y.o. [name] Primary Care Provider: Enid Baas Other Clinician: Referring  Provider: Treating Provider/Extender: RO BSO N, MICHA EL Verdis Prime, [name] in Treatment: 37 Procedure Performed for: Wound #3 Proximal,Midline Sacrum Performed By: Physician Maxwell Caul, MD Post Procedure Diagnosis Same as Pre-procedure Electronic Signature(s) Signed: 01/20/2023 4:56:06 PM By: Midge Aver MSN RN CNS WTA Entered By: Midge Aver on 01/20/2023 15:55:10 -------------------------------------------------------------------------------- Physical Exam Details Patient Name: Date of Service: [name] ND, [name] 01/20/2023 3:15 PM Medical Record Number: [id number] Patient Account Number: [id number] Date of Birth/Sex: Treating RN: 08-27-1960 (62 y.o. [name] Primary Care Provider: Enid Baas Other Clinician: Referring Provider: Treating Provider/Extender: RO BSO N, MICHA EL Verdis Prime, [name] in Treatment: 37 Constitutional Sitting or standing Blood Pressure is within target range for patient.. Pulse regular and within target range for patient.Marland Kitchen Respirations regular, non-labored and within target range.. Temperature is normal and within the target range for the patient.Marland Kitchen appears in no distress. Notes ELLIETT, GUARISCO ([id number]) [id number] Page 3 of 8 Wound exam; lower sacrum there is granulation tissue that this does not probe to bone. Undermining distally seems better. Granulation looks healthy. She has an area protuberant tissue which I do not think is helping the situation. I used silver nitrate to knock this back. There was no suggestion of a additional problem area. Electronic Signature(s) Signed: 01/20/2023 4:50:17 PM By: Baltazar Najjar MD Entered By: Baltazar Najjar on 01/20/2023 16:14:43 -------------------------------------------------------------------------------- Physician Orders Details Patient Name: Date of Service: [name] ND, [name] 01/20/2023 3:15 PM Medical Record  Number: [id number] Patient Account Number: [id number] Date of Birth/Sex: Treating RN: Mar 21, 1961 (62 y.o. [name] Primary Care Provider: Enid Baas Other Clinician: Referring Provider: Treating Provider/Extender: RO BSO N, MICHA EL Verdis Prime, [name] in Treatment: 5 Verbal / Phone Orders: No Diagnosis Coding Home Health CONTINUE Home Health for wound care. May utilize formulary equivalent dressing for wound treatment orders unless otherwise specified. Home Health Nurse may visit PRN to address patients wound care needs. - ADORATION May discontinue NPWT change to collagen and , hydrogel **Please direct any NON-WOUND related issues/requests for orders to patient's Primary Care Physician. **If current dressing causes regression in wound condition, may D/C ordered dressing product/s and apply Normal Saline Moist Dressing daily until next Wound Healing Center or Other MD appointment. **Notify Wound Healing Center of regression in wound condition at [phone]. - Okay for patient to start back with physical therapy since she is no longer on the [facility] Negative Pressure Wound Therapy Wound #3 Proximal,Midline Sacrum Home Health Nurse may d/c VAC for s/s of increased infection, significant wound regression, or uncontrolled drainage. Notify Wound Healing Center at [phone]. - ADORATION Discontinue NPWT. Wound Treatment Wound #3 - Sacrum Wound Laterality: Midline, Proximal Cleanser: Vashe 5.8 (oz) 1 x Per Day/30 Days Discharge Instructions: Use vashe 5.8 (oz) as directed Prim Dressing: Gauze 1 x Per Day/30 Days ary Discharge Instructions: As directed: dry, moistened with saline or moistened with Dakins Solution Prim Dressing: Prisma 4.34 (in) (Home Health) 1 x Per Day/30 Days ary Discharge Instructions: Moisten w/normal saline or sterile water; Cover  much pressure on the lower coccyx area. Electronic Signature(s) Signed: 01/20/2023 4:50:17 PM By: Baltazar Najjar MD Entered By: Baltazar Najjar on 01/20/2023 16:13:26 -------------------------------------------------------------------------------- Gaynelle Adu TISS Details Patient Name: Date of Service: [name] ND, [name] 01/20/2023 3:15 PM Medical Record Number: [id number] Patient Account Number: [id number] Date of Birth/Sex: Treating RN: 1960-07-28 (62 y.o. [name] Primary Care Provider: Enid Baas Other Clinician: Referring  Provider: Treating Provider/Extender: RO BSO N, MICHA EL Verdis Prime, [name] in Treatment: 37 Procedure Performed for: Wound #3 Proximal,Midline Sacrum Performed By: Physician Maxwell Caul, MD Post Procedure Diagnosis Same as Pre-procedure Electronic Signature(s) Signed: 01/20/2023 4:56:06 PM By: Midge Aver MSN RN CNS WTA Entered By: Midge Aver on 01/20/2023 15:55:10 -------------------------------------------------------------------------------- Physical Exam Details Patient Name: Date of Service: [name] ND, [name] 01/20/2023 3:15 PM Medical Record Number: [id number] Patient Account Number: [id number] Date of Birth/Sex: Treating RN: 08-27-1960 (62 y.o. [name] Primary Care Provider: Enid Baas Other Clinician: Referring Provider: Treating Provider/Extender: RO BSO N, MICHA EL Verdis Prime, [name] in Treatment: 37 Constitutional Sitting or standing Blood Pressure is within target range for patient.. Pulse regular and within target range for patient.Marland Kitchen Respirations regular, non-labored and within target range.. Temperature is normal and within the target range for the patient.Marland Kitchen appears in no distress. Notes ELLIETT, GUARISCO ([id number]) [id number] Page 3 of 8 Wound exam; lower sacrum there is granulation tissue that this does not probe to bone. Undermining distally seems better. Granulation looks healthy. She has an area protuberant tissue which I do not think is helping the situation. I used silver nitrate to knock this back. There was no suggestion of a additional problem area. Electronic Signature(s) Signed: 01/20/2023 4:50:17 PM By: Baltazar Najjar MD Entered By: Baltazar Najjar on 01/20/2023 16:14:43 -------------------------------------------------------------------------------- Physician Orders Details Patient Name: Date of Service: [name] ND, [name] 01/20/2023 3:15 PM Medical Record  Number: [id number] Patient Account Number: [id number] Date of Birth/Sex: Treating RN: Mar 21, 1961 (62 y.o. [name] Primary Care Provider: Enid Baas Other Clinician: Referring Provider: Treating Provider/Extender: RO BSO N, MICHA EL Verdis Prime, [name] in Treatment: 5 Verbal / Phone Orders: No Diagnosis Coding Home Health CONTINUE Home Health for wound care. May utilize formulary equivalent dressing for wound treatment orders unless otherwise specified. Home Health Nurse may visit PRN to address patients wound care needs. - ADORATION May discontinue NPWT change to collagen and , hydrogel **Please direct any NON-WOUND related issues/requests for orders to patient's Primary Care Physician. **If current dressing causes regression in wound condition, may D/C ordered dressing product/s and apply Normal Saline Moist Dressing daily until next Wound Healing Center or Other MD appointment. **Notify Wound Healing Center of regression in wound condition at [phone]. - Okay for patient to start back with physical therapy since she is no longer on the [facility] Negative Pressure Wound Therapy Wound #3 Proximal,Midline Sacrum Home Health Nurse may d/c VAC for s/s of increased infection, significant wound regression, or uncontrolled drainage. Notify Wound Healing Center at [phone]. - ADORATION Discontinue NPWT. Wound Treatment Wound #3 - Sacrum Wound Laterality: Midline, Proximal Cleanser: Vashe 5.8 (oz) 1 x Per Day/30 Days Discharge Instructions: Use vashe 5.8 (oz) as directed Prim Dressing: Gauze 1 x Per Day/30 Days ary Discharge Instructions: As directed: dry, moistened with saline or moistened with Dakins Solution Prim Dressing: Prisma 4.34 (in) (Home Health) 1 x Per Day/30 Days ary Discharge Instructions: Moisten w/normal saline or sterile water; Cover

## 2023-01-20 NOTE — Progress Notes (Signed)
[name] ([id number]) [id number] Page 1 of 8 Visit Report for 01/20/2023 Arrival Information Details Patient Name: Date of Service: [name] Glen [name], [name] 01/20/2023 3:15 PM Medical Record Number: [id number] Patient Account Number: [id number] Date of Birth/Sex: Treating RN: Mar 04, 1961 (62 y.o. [name] Primary Care Joana Nolton: Enid Baas Other Clinician: Referring Mildreth Reek: Treating Vidalia Serpas/Extender: RO BSO N, MICHA EL Verdis Prime, Radhika Weeks in Treatment: 37 Visit Information History Since Last Visit Added or deleted any medications: No Patient Arrived: Wheel Chair Any new allergies or adverse reactions: No Arrival Time: 15:16 Has Dressing in Place as Prescribed: Yes Accompanied By: boyfriend Pain Present Now: No Transfer Assistance: Nurse, adult Patient Identification Verified: Yes Secondary Verification Process Completed: Yes Patient Requires Transmission-Based Precautions: No Patient Has Alerts: Yes Patient Alerts: Patient on Blood Thinner Not Diabetic Xarelto Electronic Signature(s) Signed: 01/20/2023 4:56:06 PM By: Midge Aver MSN RN CNS WTA Entered By: Midge Aver on 01/20/2023 15:19:04 -------------------------------------------------------------------------------- Clinic Level of Care Assessment Details Patient Name: Date of Service: [name] Glen [name], [name] 01/20/2023 3:15 PM Medical Record Number: [id number] Patient Account Number: [id number] Date of Birth/Sex: Treating RN: 12/29/1960 (62 y.o. [name] Primary Care Eland Lamantia: Enid Baas Other Clinician: Referring Tajai Ihde: Treating Angelea Penny/Extender: RO BSO N, MICHA EL Verdis Prime, Radhika Weeks in Treatment: 37 Clinic Level of Care Assessment Items TOOL 1 Quantity Score []  - 0 Use when EandM and Procedure is performed on INITIAL visit ASSESSMENTS - Nursing Assessment / Reassessment []  - 0 General Physical Exam (combine w/ comprehensive  assessment (listed just below) when performed on new pt. evals) []  - 0 Comprehensive Assessment (HX, ROS, Risk Assessments, Wounds Hx, etc.) ASSESSMENTS - Wound and Skin Assessment / Reassessment []  - 0 Dermatologic / Skin Assessment (not related to wound area) Gust Rung, [name] ([id number]) [id number] Page 2 of 8 ASSESSMENTS - Ostomy and/or Continence Assessment and Care []  - 0 Incontinence Assessment and Management []  - 0 Ostomy Care Assessment and Management (repouching, etc.) PROCESS - Coordination of Care []  - 0 Simple Patient / Family Education for ongoing care []  - 0 Complex (extensive) Patient / Family Education for ongoing care []  - 0 Staff obtains Chiropractor, Records, T Results / Process Orders est []  - 0 Staff telephones HHA, Nursing Homes / Clarify orders / etc []  - 0 Routine Transfer to another Facility (non-emergent condition) []  - 0 Routine Hospital Admission (non-emergent condition) []  - 0 New Admissions / Manufacturing engineer / Ordering NPWT Apligraf, etc. , []  - 0 Emergency Hospital Admission (emergent condition) PROCESS - Special Needs []  - 0 Pediatric / Minor Patient Management []  - 0 Isolation Patient Management []  - 0 Hearing / Language / Visual special needs []  - 0 Assessment of Community assistance (transportation, D/C planning, etc.) []  - 0 Additional assistance / Altered mentation []  - 0 Support Surface(s) Assessment (bed, cushion, seat, etc.) INTERVENTIONS - Miscellaneous []  - 0 External ear exam []  - 0 Patient Transfer (multiple staff / Nurse, adult / Similar devices) []  - 0 Simple Staple / Suture removal (25 or less) []  - 0 Complex Staple / Suture removal (26 or more) []  - 0 Hypo/Hyperglycemic Management (do not check if billed separately) []  - 0 Ankle / Brachial Index (ABI) - do not check if billed separately Has the patient been seen at the hospital within the last three years: Yes Total Score:  0 Level Of Care: ____ Electronic Signature(s) Signed: 01/20/2023 4:56:06 PM By: Midge Aver MSN RN CNS WTA Entered By: Katrinka Blazing,  Date Inactivated: 07/29/2022 Target Resolution Date: 08/28/2022 Goal Status: Met Ulcer/skin breakdown will have a volume reduction of 30% by week 4 Date Initiated: 05/06/2022 Date Inactivated: 07/29/2022 Target Resolution Date: 06/06/2022 Goal Status: Unmet Unmet Reason: not 30% decrease Ulcer/skin breakdown will have a volume reduction of 50% by week 8 Date Initiated: 05/20/2022 Date Inactivated: 07/29/2022 Target Resolution Date: 07/05/2022 Unmet Reason: not 50% decrease in Goal Status: Unmet size Ulcer/skin breakdown will have a volume reduction of 80% by week 12 Date Initiated: 05/20/2022 Date Inactivated: 07/29/2022 Target Resolution Date: 08/05/2022 Unmet Reason: not 80% decrease in Goal Status: Unmet size Ulcer/skin breakdown will heal within 14 weeks Date Initiated: 05/20/2022 Target Resolution Date: 02/08/2023 Goal Status: Active Interventions: Assess patient/caregiver ability to perform ulcer/skin care regimen upon admission and as needed Assess ulceration(s) every  visit Provide education on ulcer and skin care Screen for HBO Treatment Activities: Skin care regimen initiated : 05/06/2022 Topical wound management initiated : 05/06/2022 Notes: Electronic Signature(s) Signed: 01/20/2023 4:56:06 PM By: Midge Aver MSN RN CNS WTA Entered By: Midge Aver on 01/20/2023 16:14:55 -------------------------------------------------------------------------------- Pain Assessment Details Patient Name: Date of Service: [name] Glen [name], [name] 01/20/2023 3:15 PM Medical Record Number: [id number] Patient Account Number: [id number] Date of Birth/Sex: Treating RN: 1960/08/05 (62 y.o. [name] Primary Care Rehan Holness: Enid Baas Other Clinician: Referring Harvard Zeiss: Treating Izeyah Deike/Extender: RO BSO N, MICHA EL Verdis Prime, Radhika Weeks in Treatment: 37 Active Problems Location of Pain Severity and Description of Pain Patient Has Paino No Site Locations [place], [place] ([id number]) [id number] Page 6 of 8 Pain Management and Medication Current Pain Management: Electronic Signature(s) Signed: 01/20/2023 4:56:06 PM By: Midge Aver MSN RN CNS WTA Entered By: Midge Aver on 01/20/2023 15:40:08 -------------------------------------------------------------------------------- Patient/Caregiver Education Details Patient Name: Date of Service: [name] Glen [name], [name] 10/9/2024andnbsp3:15 PM Medical Record Number: [id number] Patient Account Number: [id number] Date of Birth/Gender: Treating RN: 04/24/1960 (62 y.o. [name] Primary Care Physician: Enid Baas Other Clinician: Referring Physician: Treating Physician/Extender: RO BSO N, MICHA EL Dion Body Weeks in Treatment: 37 Education Assessment Education Provided To: Patient Education Topics Provided Wound/Skin Impairment: Handouts: Caring for Your Ulcer Methods: Explain/Verbal Responses: State content correctly Electronic  Signature(s) Signed: 01/20/2023 4:56:06 PM By: Midge Aver MSN RN CNS WTA Entered By: Midge Aver on 01/20/2023 16:18:49 [name] ([id number]) [id number] Page 7 of 8 -------------------------------------------------------------------------------- Wound Assessment Details Patient Name: Date of Service: [name] Glen [name], [name] 01/20/2023 3:15 PM Medical Record Number: [id number] Patient Account Number: [id number] Date of Birth/Sex: Treating RN: July 07, 1960 (62 y.o. [name] Primary Care Preeti Winegardner: Enid Baas Other Clinician: Referring Latisha Lasch: Treating Kilan Banfill/Extender: RO BSO N, MICHA EL Verdis Prime, Radhika Weeks in Treatment: 37 Wound Status Wound Number: 3 Primary Etiology: Pressure Ulcer Wound Location: Proximal, Midline Sacrum Wound Status: Open Wounding Event: Pressure Injury Comorbid History: Angina, Hypotension Date Acquired: 03/09/2022 Weeks Of Treatment: 37 Clustered Wound: No Photos Wound Measurements Length: (cm) 1 Width: (cm) 0.4 Depth: (cm) 0.7 Area: (cm) 0.314 Volume: (cm) 0.22 % Reduction in Area: 98.7% % Reduction in Volume: 99.8% Epithelialization: None Undermining: Yes Starting Position (o'clock): 3 Ending Position (o'clock): 6 Maximum Distance: (cm) 1.3 Wound Description Classification: Category/Stage IV Wound Margin: Epibole Exudate Amount: Large Exudate Type: Serosanguineous Exudate Color: red, brown Foul Odor After Cleansing: No Slough/Fibrino Yes Wound Bed Granulation Amount: Large (67-100%) Exposed Structure Granulation Quality: Red, Hyper-granulation Fascia Exposed: No Necrotic Amount: Small (1-33%) Fat Layer (Subcutaneous Tissue) Exposed: Yes Tendon Exposed:  Date Inactivated: 07/29/2022 Target Resolution Date: 08/28/2022 Goal Status: Met Ulcer/skin breakdown will have a volume reduction of 30% by week 4 Date Initiated: 05/06/2022 Date Inactivated: 07/29/2022 Target Resolution Date: 06/06/2022 Goal Status: Unmet Unmet Reason: not 30% decrease Ulcer/skin breakdown will have a volume reduction of 50% by week 8 Date Initiated: 05/20/2022 Date Inactivated: 07/29/2022 Target Resolution Date: 07/05/2022 Unmet Reason: not 50% decrease in Goal Status: Unmet size Ulcer/skin breakdown will have a volume reduction of 80% by week 12 Date Initiated: 05/20/2022 Date Inactivated: 07/29/2022 Target Resolution Date: 08/05/2022 Unmet Reason: not 80% decrease in Goal Status: Unmet size Ulcer/skin breakdown will heal within 14 weeks Date Initiated: 05/20/2022 Target Resolution Date: 02/08/2023 Goal Status: Active Interventions: Assess patient/caregiver ability to perform ulcer/skin care regimen upon admission and as needed Assess ulceration(s) every  visit Provide education on ulcer and skin care Screen for HBO Treatment Activities: Skin care regimen initiated : 05/06/2022 Topical wound management initiated : 05/06/2022 Notes: Electronic Signature(s) Signed: 01/20/2023 4:56:06 PM By: Midge Aver MSN RN CNS WTA Entered By: Midge Aver on 01/20/2023 16:14:55 -------------------------------------------------------------------------------- Pain Assessment Details Patient Name: Date of Service: [name] Glen [name], [name] 01/20/2023 3:15 PM Medical Record Number: [id number] Patient Account Number: [id number] Date of Birth/Sex: Treating RN: 1960/08/05 (62 y.o. [name] Primary Care Rehan Holness: Enid Baas Other Clinician: Referring Harvard Zeiss: Treating Izeyah Deike/Extender: RO BSO N, MICHA EL Verdis Prime, Radhika Weeks in Treatment: 37 Active Problems Location of Pain Severity and Description of Pain Patient Has Paino No Site Locations [place], [place] ([id number]) [id number] Page 6 of 8 Pain Management and Medication Current Pain Management: Electronic Signature(s) Signed: 01/20/2023 4:56:06 PM By: Midge Aver MSN RN CNS WTA Entered By: Midge Aver on 01/20/2023 15:40:08 -------------------------------------------------------------------------------- Patient/Caregiver Education Details Patient Name: Date of Service: [name] Glen [name], [name] 10/9/2024andnbsp3:15 PM Medical Record Number: [id number] Patient Account Number: [id number] Date of Birth/Gender: Treating RN: 04/24/1960 (62 y.o. [name] Primary Care Physician: Enid Baas Other Clinician: Referring Physician: Treating Physician/Extender: RO BSO N, MICHA EL Dion Body Weeks in Treatment: 37 Education Assessment Education Provided To: Patient Education Topics Provided Wound/Skin Impairment: Handouts: Caring for Your Ulcer Methods: Explain/Verbal Responses: State content correctly Electronic  Signature(s) Signed: 01/20/2023 4:56:06 PM By: Midge Aver MSN RN CNS WTA Entered By: Midge Aver on 01/20/2023 16:18:49 [name] ([id number]) [id number] Page 7 of 8 -------------------------------------------------------------------------------- Wound Assessment Details Patient Name: Date of Service: [name] Glen [name], [name] 01/20/2023 3:15 PM Medical Record Number: [id number] Patient Account Number: [id number] Date of Birth/Sex: Treating RN: July 07, 1960 (62 y.o. [name] Primary Care Preeti Winegardner: Enid Baas Other Clinician: Referring Latisha Lasch: Treating Kilan Banfill/Extender: RO BSO N, MICHA EL Verdis Prime, Radhika Weeks in Treatment: 37 Wound Status Wound Number: 3 Primary Etiology: Pressure Ulcer Wound Location: Proximal, Midline Sacrum Wound Status: Open Wounding Event: Pressure Injury Comorbid History: Angina, Hypotension Date Acquired: 03/09/2022 Weeks Of Treatment: 37 Clustered Wound: No Photos Wound Measurements Length: (cm) 1 Width: (cm) 0.4 Depth: (cm) 0.7 Area: (cm) 0.314 Volume: (cm) 0.22 % Reduction in Area: 98.7% % Reduction in Volume: 99.8% Epithelialization: None Undermining: Yes Starting Position (o'clock): 3 Ending Position (o'clock): 6 Maximum Distance: (cm) 1.3 Wound Description Classification: Category/Stage IV Wound Margin: Epibole Exudate Amount: Large Exudate Type: Serosanguineous Exudate Color: red, brown Foul Odor After Cleansing: No Slough/Fibrino Yes Wound Bed Granulation Amount: Large (67-100%) Exposed Structure Granulation Quality: Red, Hyper-granulation Fascia Exposed: No Necrotic Amount: Small (1-33%) Fat Layer (Subcutaneous Tissue) Exposed: Yes Tendon Exposed:  No Muscle Exposed: Yes Necrosis of Muscle: No Joint Exposed: No Bone Exposed: No Treatment Notes Wound #3 (Sacrum) Wound Laterality: Midline, Proximal [name] ([id number])  [phone] Page 8 of 8 Cleanser Vashe 5.8 (oz) Discharge Instruction: Use vashe 5.8 (oz) as directed Peri-Wound Care Topical Primary Dressing Gauze Discharge Instruction: As directed: dry, moistened with saline or moistened with Dakins Solution Prisma 4.34 (in) Discharge Instruction: Moisten w/normal saline or sterile water; Cover wound as directed. Do not remove from wound bed. Secondary Dressing ABD Pad 5x9 (in/in) Discharge Instruction: Cover with ABD pad Secured With Paper Tape, 0.5x10 (in/yd) Compression Wrap Compression Stockings Add-Ons Electronic Signature(s) Signed: 01/20/2023 4:56:06 PM By: Midge Aver MSN RN CNS WTA Entered By: Midge Aver on 01/20/2023 15:41:43 -------------------------------------------------------------------------------- Vitals Details Patient Name: Date of Service: [name] Glen [name], [name] 01/20/2023 3:15 PM Medical Record Number: [id number] Patient Account Number: [id number] Date of Birth/Sex: Treating RN: 1961-01-07 (62 y.o. [name] Primary Care Delois Silvester: Enid Baas Other Clinician: Referring Rockney Grenz: Treating Danyel Tobey/Extender: RO BSO N, MICHA EL Verdis Prime, Radhika Weeks in Treatment: 37 Vital Signs Time Taken: 15:19 Temperature (F): 98.2 Pulse (bpm): 94 Respiratory Rate (breaths/min): 18 Blood Pressure (mmHg): 120/53 Reference Range: 80 - 120 mg / dl Electronic Signature(s) Signed: 01/20/2023 4:56:06 PM By: Midge Aver MSN RN CNS WTA Entered By: Midge Aver on 01/20/2023 15:39:59

## 2023-02-03 ENCOUNTER — Encounter: Payer: Medicare Other | Admitting: Internal Medicine

## 2023-02-03 DIAGNOSIS — L89154 Pressure ulcer of sacral region, stage 4: Secondary | ICD-10-CM | POA: Diagnosis not present

## 2023-02-04 NOTE — Progress Notes (Signed)
0 Wound Tracing (instead of photographs) X- 1 5 Simple Wound Measurement - one wound []  - 0 Complex Wound Measurement - multiple wounds INTERVENTIONS - Wound Dressings X - Small Wound Dressing one or multiple wounds 1 10 []  - 0 Medium Wound Dressing one or multiple wounds []  - 0 Large Wound Dressing one or multiple wounds []  - 0 Application of Medications - topical []  - 0 Application of Medications - injection INTERVENTIONS - Miscellaneous []  - 0 External ear exam []  - 0 Specimen Collection (cultures, biopsies, blood, body fluids, etc.) [name], [name] ([id number]) [phone].pdf Page 3 of 9 []  - 0 Specimen(s) / Culture(s) sent or taken to Lab for analysis X- 1 10 Patient Transfer (multiple staff / Michiel Sites Lift / Similar devices) []  - 0 Simple Staple / Suture removal (25 or less) []  - 0 Complex Staple / Suture removal (26 or more) []  - 0 Hypo / Hyperglycemic Management (close monitor of Blood Glucose) []  - 0 Ankle / Brachial Index (ABI) - do not check if billed separately X- 1 5 Vital Signs Has the patient been seen at the hospital within the last three years: Yes Total Score: 95 Level Of Care: New/Established - Level 3 Electronic Signature(s) Signed: 02/04/2023 4:52:24 PM By: Midge Aver MSN RN CNS WTA Entered By: Midge Aver on 02/03/2023 14:13:15 -------------------------------------------------------------------------------- Encounter Discharge Information Details Patient Name: Date of Service: [name], [name] 02/03/2023 2:15 PM Medical Record Number: [id number] Patient Account Number: [id number] Date of Birth/Sex: Treating RN: 1960/07/14 (62 y.o. [name] Primary Care Julie-Ann Vanmaanen: Enid Baas Other Clinician: Referring  Paris Hohn: Treating Olukemi Panchal/Extender: RO BSO N, MICHA EL Verdis Prime, Radhika Weeks in Treatment: 52 Encounter Discharge Information Items Discharge Condition: Stable Ambulatory Status: Wheelchair Discharge Destination: Home Transportation: Private Auto Accompanied By: boyfriend Schedule Follow-up Appointment: Yes Clinical Summary of Care: Electronic Signature(s) Signed: 02/03/2023 5:24:17 PM By: Midge Aver MSN RN CNS WTA Previous Signature: 02/03/2023 5:14:27 PM Version By: Midge Aver MSN RN CNS WTA Entered By: Midge Aver on 02/03/2023 14:24:16 -------------------------------------------------------------------------------- Lower Extremity Assessment Details Patient Name: Date of Service: [name], [name] 02/03/2023 2:15 PM Medical Record Number: [id number] Patient Account Number: [id number] [name], [name] ([id number]) [phone].pdf Page 4 of 9 Date of Birth/Sex: Treating RN: 28-Nov-1960 (62 y.o. [name] Primary Care Rudy Luhmann: Enid Baas Other Clinician: Referring Margo Lama: Treating Alayasia Breeding/Extender: RO BSO N, MICHA EL Verdis Prime, Radhika Weeks in Treatment: 39 Electronic Signature(s) Signed: 02/04/2023 4:52:24 PM By: Midge Aver MSN RN CNS WTA Entered By: Midge Aver on 02/03/2023 12:01:38 -------------------------------------------------------------------------------- Multi Wound Chart Details Patient Name: Date of Service: [name], [name] 02/03/2023 2:15 PM Medical Record Number: [id number] Patient Account Number: [id number] Date of Birth/Sex: Treating RN: 1961/01/10 (62 y.o. [name] Primary Care Deionte Spivack: Enid Baas Other Clinician: Referring Azie Mcconahy: Treating Daniell Paradise/Extender: RO BSO N, MICHA EL Verdis Prime, Radhika Weeks in Treatment: 39 Vital Signs Height(in): Pulse(bpm): 82 Weight(lbs): Blood Pressure(mmHg): 89/61 Body Mass Index(BMI): Temperature(F):  98.3 Respiratory Rate(breaths/min): 18 [3:Photos:] [N/A:N/A] Proximal, Midline Sacrum N/A N/A Wound Location: Pressure Injury N/A N/A Wounding Event: Pressure Ulcer N/A N/A Primary Etiology: Angina, Hypotension N/A N/A Comorbid History: 03/09/2022 N/A N/A Date Acquired: 28 N/A N/A Weeks of Treatment: Open N/A N/A Wound Status: No N/A N/A Wound Recurrence: 0.7x0.4x1 N/A N/A Measurements L x W x D (cm) 0.22 N/A N/A A (cm) : rea 0.22 N/A N/A Volume (cm) : 99.10% N/A N/A % Reduction in A rea: 99.80%  [name], [name] ([id number]) [id number] Page 1 of 9 Visit Report for 02/03/2023 Arrival Information Details Patient Name: Date of Service: [name], [name] 02/03/2023 2:15 PM Medical Record Number: [id number] Patient Account Number: [id number] Date of Birth/Sex: Treating RN: 11-04-1960 (62 y.o. [name] Primary Care Nickalaus Crooke: Enid Baas Other Clinician: Referring Kobee Medlen: Treating Myran Arcia/Extender: RO BSO N, MICHA EL Verdis Prime, Radhika Weeks in Treatment: 39 Visit Information History Since Last Visit Added or deleted any medications: No Patient Arrived: Wheel Chair Any new allergies or adverse reactions: No Arrival Time: 14:40 Has Dressing in Place as Prescribed: Yes Accompanied By: Boyfriend Pain Present Now: No Transfer Assistance: Nurse, adult Patient Identification Verified: Yes Secondary Verification Process Completed: Yes Patient Requires Transmission-Based Precautions: No Patient Has Alerts: Yes Patient Alerts: Patient on Blood Thinner Not Diabetic Xarelto Electronic Signature(s) Signed: 02/04/2023 4:52:24 PM By: Midge Aver MSN RN CNS WTA Entered By: Midge Aver on 02/03/2023 11:40:44 -------------------------------------------------------------------------------- Clinic Level of Care Assessment Details Patient Name: Date of Service: [name], [name] 02/03/2023 2:15 PM Medical Record Number: [id number] Patient Account Number: [id number] Date of Birth/Sex: Treating RN: Oct 07, 1960 (62 y.o. [name] Primary Care Cielle Aguila: Enid Baas Other Clinician: Referring Anaclara Acklin: Treating [name]/Extender: RO BSO N, MICHA EL Verdis Prime, Radhika Weeks in Treatment: 39 Clinic Level of Care Assessment Items TOOL 4 Quantity Score X- 1 0 Use when only an EandM is performed on FOLLOW-UP visit ASSESSMENTS - Nursing Assessment / Reassessment X- 1 10 Reassessment of Co-morbidities (includes  updates in patient status) X- 1 5 Reassessment of Adherence to Treatment Plan ASSESSMENTS - Wound and Skin A ssessment / Reassessment X - Simple Wound Assessment / Reassessment - one wound 1 5 [name], [name] ([id number]) [phone].pdf Page 2 of 9 []  - 0 Complex Wound Assessment / Reassessment - multiple wounds []  - 0 Dermatologic / Skin Assessment (not related to wound area) ASSESSMENTS - Focused Assessment []  - 0 Circumferential Edema Measurements - multi extremities []  - 0 Nutritional Assessment / Counseling / Intervention []  - 0 Lower Extremity Assessment (monofilament, tuning fork, pulses) []  - 0 Peripheral Arterial Disease Assessment (using hand held doppler) ASSESSMENTS - Ostomy and/or Continence Assessment and Care []  - 0 Incontinence Assessment and Management []  - 0 Ostomy Care Assessment and Management (repouching, etc.) PROCESS - Coordination of Care X - Simple Patient / Family Education for ongoing care 1 15 []  - 0 Complex (extensive) Patient / Family Education for ongoing care X- 1 10 Staff obtains Chiropractor, Records, T Results / Process Orders est []  - 0 Staff telephones HHA, Nursing Homes / Clarify orders / etc []  - 0 Routine Transfer to another Facility (non-emergent condition) []  - 0 Routine Hospital Admission (non-emergent condition) []  - 0 New Admissions / Manufacturing engineer / Ordering NPWT Apligraf, etc. , []  - 0 Emergency Hospital Admission (emergent condition) X- 1 10 Simple Discharge Coordination []  - 0 Complex (extensive) Discharge Coordination PROCESS - Special Needs []  - 0 Pediatric / Minor Patient Management []  - 0 Isolation Patient Management []  - 0 Hearing / Language / Visual special needs []  - 0 Assessment of Community assistance (transportation, D/C planning, etc.) []  - 0 Additional assistance / Altered mentation []  - 0 Support Surface(s) Assessment (bed, cushion, seat, etc.) INTERVENTIONS -  Wound Cleansing / Measurement X - Simple Wound Cleansing - one wound 1 5 []  - 0 Complex Wound Cleansing - multiple wounds X- 1 5 Wound Imaging (photographs - any number of wounds) []  -  PM By: Midge Aver MSN RN CNS WTA Entered By: Midge Aver on 02/03/2023 14:13:43 -------------------------------------------------------------------------------- Wound Assessment Details Patient Name: Date of Service: [name], [name] 02/03/2023 2:15 PM Medical Record Number: [id number] Patient Account Number: [id number] Date of Birth/Sex: Treating RN: Jul 10, 1960 (62 y.o. [name] Primary Care Melania Kirks: Enid Baas Other Clinician: Referring Nayellie Sanseverino: Treating Neesa Knapik/Extender: RO BSO N, MICHA EL Verdis Prime, Radhika Weeks in Treatment: 39 Wound Status Wound Number: 3 Primary Etiology: Pressure  Ulcer Wound Location: Proximal, Midline Sacrum Wound Status: Open Wounding Event: Pressure Injury Comorbid History: Angina, Hypotension Date Acquired: 03/09/2022 Weeks Of Treatment: 39 Clustered Wound: No Photos Wound Measurements Length: (cm) 0.7 Width: (cm) 0.4 Depth: (cm) 1 Area: (cm) 0.22 Volume: (cm) 0.22 [name], [name] ([id number]) Wound Description Classification: Category/Stage IV Wound Margin: Epibole Exudate Amount: Large Exudate Type: Serosanguineous Exudate Color: red, brown Foul Odor After Cleansing: No Slough/Fibrino Yes % Reduction in Area: 99.1% % Reduction in Volume: 99.8% Epithelialization: None [id number] Page 8 of 9 Wound Bed Granulation Amount: Large (67-100%) Exposed Structure Granulation Quality: Red, Hyper-granulation Fascia Exposed: No Necrotic Amount: Small (1-33%) Fat Layer (Subcutaneous Tissue) Exposed: Yes Tendon Exposed: No Muscle Exposed: Yes Necrosis of Muscle: No Joint Exposed: No Bone Exposed: No Treatment Notes Wound #3 (Sacrum) Wound Laterality: Midline, Proximal Cleanser Vashe 5.8 (oz) Discharge Instruction: Use vashe 5.8 (oz) as directed Peri-Wound Care Topical Primary Dressing Prisma 4.34 (in) Discharge Instruction: Moisten w/normal saline or sterile water; Cover wound as directed. Do not remove from wound bed. Wound Hydrogel, 3 (oz), tube Secondary Dressing ABD Pad 5x9 (in/in) Discharge Instruction: Cover with ABD pad Secured With Paper Tape, 0.5x10 (in/yd) Compression Wrap Compression Stockings Add-Ons Electronic Signature(s) Signed: 02/04/2023 4:52:24 PM By: Midge Aver MSN RN CNS WTA Entered By: Midge Aver on 02/03/2023 12:01:25 -------------------------------------------------------------------------------- Vitals Details Patient Name: Date of Service: [name], [name] 02/03/2023 2:15 PM Medical Record Number: [id number] Patient Account Number: [id number] Date  of Birth/Sex: Treating RN: 1961/04/11 (62 y.o. [name] Primary Care Aerionna Moravek: Enid Baas Other Clinician: Referring Itzae Miralles: Treating Shakena Callari/Extender: RO BSO N, MICHA EL Verdis Prime, Radhika Weeks in Treatment: [address] [name], [name] ([id number]) [id number] Page 9 of 9 Time Taken: 14:46 Temperature (F): 98.3 Pulse (bpm): 82 Respiratory Rate (breaths/min): 18 Blood Pressure (mmHg): 89/61 Reference Range: 80 - 120 mg / dl Electronic Signature(s) Signed: 02/04/2023 4:52:24 PM By: Midge Aver MSN RN CNS WTA Entered By: Midge Aver on 02/03/2023 11:47:12  N/A N/A % 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 N/A N/A Classification: Large N/A N/A Exudate A mount: Serosanguineous N/A N/A Exudate Type: red, brown N/A N/A Exudate Color: Epibole N/A N/A Wound Margin: Large (67-100%) N/A N/A Granulation A mount: Red, Hyper-granulation N/A N/A Granulation Quality: Small (1-33%) N/A N/A Necrotic A mount: Fat Layer (Subcutaneous Tissue): Yes N/A N/A Exposed Structures: Muscle: Yes Fascia: No Tendon: No Joint: No Bone: No [name], [name] ([id number]) [phone].pdf Page 5 of 9 None N/A N/A Epithelialization: Treatment Notes Electronic Signature(s) Signed: 02/04/2023 4:52:24 PM By: Midge Aver MSN RN CNS WTA Entered By: Midge Aver on 02/03/2023 12:01:45 -------------------------------------------------------------------------------- Multi-Disciplinary Care Plan Details Patient Name: Date of Service: [name], [name] 02/03/2023 2:15 PM Medical Record Number: [id number] Patient Account Number: [id number] Date of Birth/Sex: Treating RN: [birth year] (62 y.o. [name] Primary Care Rabon Scholle: Enid Baas Other Clinician: Referring Philippe Gang: Treating Teona Vargus/Extender: RO BSO N, MICHA EL Verdis Prime, Radhika Weeks in Treatment: 39 Active Inactive Wound/Skin Impairment Nursing Diagnoses: Impaired tissue integrity Knowledge deficit related to ulceration/compromised skin  integrity Goals: Patient/caregiver will verbalize understanding of skin care regimen Date Initiated: 05/06/2022 Date Inactivated: 07/29/2022 Target Resolution Date: 08/28/2022 Goal Status: Met Ulcer/skin breakdown will have a volume reduction of 30% by week 4 Date Initiated: 05/06/2022 Date Inactivated: 07/29/2022 Target Resolution Date: 06/06/2022 Goal Status: Unmet Unmet Reason: not 30% decrease Ulcer/skin breakdown will have a volume reduction of 50% by week 8 Date Initiated: 05/20/2022 Date Inactivated: 07/29/2022 Target Resolution Date: 07/05/2022 Unmet Reason: not 50% decrease in Goal Status: Unmet size Ulcer/skin breakdown will have a volume reduction of 80% by week 12 Date Initiated: 05/20/2022 Date Inactivated: 07/29/2022 Target Resolution Date: 08/05/2022 Unmet Reason: not 80% decrease in Goal Status: Unmet size Ulcer/skin breakdown will heal within 14 weeks Date Initiated: 05/20/2022 Target Resolution Date: 02/08/2023 Goal Status: Active Interventions: Assess patient/caregiver ability to perform ulcer/skin care regimen upon admission and as needed Assess ulceration(s) every visit Provide education on ulcer and skin care Screen for HBO Treatment Activities: Skin care regimen initiated : 05/06/2022 Topical wound management initiated : 05/06/2022 Notes: Electronic Signature(s) Signed: 02/03/2023 5:13:33 PM By: Midge Aver MSN RN CNS Ilean Skill, 02/03/2023 5:13:33 PM By: Midge Aver MSN RN CNS WTA SignedKathreen [name] ([id number]) [id number] Page 6 of 9 Entered By: Midge Aver on 02/03/2023 14:13:32 -------------------------------------------------------------------------------- Pain Assessment Details Patient Name: Date of Service: [name], [name] 02/03/2023 2:15 PM Medical Record Number: [id number] Patient Account Number: [id number] Date of Birth/Sex: Treating RN: 04/21/1960 (62 y.o. [name] Primary Care Christiane Sistare: Enid Baas Other Clinician: Referring Laylah Riga: Treating Miron Marxen/Extender: RO BSO N, MICHA EL Verdis Prime, Radhika Weeks in Treatment: 39 Active Problems Location of Pain Severity and Description of Pain Patient Has Paino No Site Locations Pain Management and Medication Current Pain Management: Electronic Signature(s) Signed: 02/04/2023 4:52:24 PM By: Midge Aver MSN RN CNS WTA Entered By: Midge Aver on 02/03/2023 11:47:19 -------------------------------------------------------------------------------- Patient/Caregiver Education Details Patient Name: Date of Service: [name], [name] 10/23/2024andnbsp2:15 PM Medical Record Number: [id number] Patient Account Number: [id number] Date of Birth/Gender: Treating RN: 07/18/1960 (63 y.o. Shakeyla, Toki, Chastity ([id number]) [phone].pdf Page 7 of 9 Primary Care Physician: Enid Baas Other Clinician: Referring Physician: Treating Physician/Extender: RO BSO N, MICHA EL Dion Body Weeks in Treatment: 51 Education Assessment Education Provided To: Patient Education Topics Provided Wound/Skin Impairment: Handouts: Caring for Your Ulcer Methods: Explain/Verbal Responses: State content correctly Electronic Signature(s) Signed: 02/04/2023 4:52:24

## 2023-02-04 NOTE — Progress Notes (Addendum)
much pressure on the lower coccyx area. 10/22; patient with a smaller ulcer on the lower sacrum/coccyx. We have been using silver collagen moistened with hydrogel change every 1 to 2 days. Last time she was here she had protuberant granulation which I knocked back with silver nitrate there is none of this today. Electronic Signature(s) Signed: 02/04/2023 3:33:32 PM By: Baltazar Najjar MD Entered By: Baltazar Najjar on 02/03/2023 12:26:05 -------------------------------------------------------------------------------- Physical Exam Details Patient Name: Date of Service: [name] ND, [name] 02/03/2023 2:15 PM Medical Record Number: [id number] Patient Account Number: [id number] Date of Birth/Sex: Treating RN: [birth year] (62 y.o. [name] Primary Care Provider: Enid Baas Other Clinician: Referring Provider: Treating Provider/Extender: RO BSO N, MICHA EL Verdis Prime, [name] in Treatment: 42 Constitutional Patient is hypotensive.Appears well however. Notes Wound exam; lower sacrum/coccyx. There is granulation. The area distally is about 1.5 cm again the granulation looks satisfactory. Less depth proximally although there is depth in this entire area. No evidence of surrounding infection Electronic Signature(s) Signed: 02/04/2023 3:33:32 PM By: Baltazar Najjar MD Entered By: Baltazar Najjar on 02/03/2023 12:30:41 -------------------------------------------------------------------------------- Physician Orders Details Patient Name: Date of Service: [name] ND, [name] 02/03/2023 2:15 PM Medical Record Number: [id number] Patient Account Number: [id number] Date of Birth/Sex: Treating RN: 20-Dec-1960 (62 y.o. 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 May utilize formulary equivalent dressing for wound treatment orders unless otherwise specified. Home Health Nurse may visit PRN to address patients wound care needs. - Collagen into the wound with hydrogel covered with small piece of ABD pad and paper tape. Wound Treatment Wound #3 - Sacrum Wound Laterality: Midline, Proximal Cleanser: Vashe 5.8 (oz) 1 x Per Day/30 Days Discharge Instructions: Use vashe 5.8 (oz) as directed Prim Dressing: Prisma 4.34 (in) (Home Health) 1 x Per Day/30 Days ary Discharge Instructions: Moisten w/normal saline or sterile water; Cover wound as directed. Do not remove from wound bed. Prim Dressing: Wound Hydrogel, 3 (oz), tube ary 1 x Per Day/30 Days Secondary Dressing: ABD Pad 5x9 (in/in) 1 x Per Day/30 Days Discharge Instructions: Cover with ABD pad Secured With: Paper Tape, 0.5x10 (in/yd) 1 x Per Day/30 Days Electronic Signature(s) Signed: 02/08/2023 1:44:37 PM By: Midge Aver MSN RN CNS WTA Signed: 02/10/2023 4:48:33 PM By: Baltazar Najjar MD Previous Signature: 02/05/2023 1:08:40 PM Version By: Midge Aver MSN RN CNS WTA Previous Signature: 02/07/2023 11:21:49 AM Version By: Baltazar Najjar MD Previous Signature: 02/03/2023 5:12:24 PM Version By: Midge Aver MSN RN CNS WTA Previous Signature: 02/04/2023 3:33:32 PM Version By: Baltazar Najjar MD Previous Signature: 02/03/2023 5:11:06 PM Version By: Midge Aver MSN RN CNS WTA Entered By: Midge Aver on 02/08/2023 10:44:36 -------------------------------------------------------------------------------- Problem List Details Patient Name: Date of Service: [name] STRA ND, [name] 02/03/2023 2:15 PM Medical Record Number: [id number] Patient  Account Number: [id number] Date of Birth/Sex: Treating RN: 1960-07-22 (62 y.o. 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, sacral and sacrococcygeal region 05/20/2022 No Yes G35 Multiple sclerosis 05/06/2022 No Yes I82.409 Acute embolism and thrombosis of unspecified deep veins of unspecified 05/06/2022 No Yes lower extremity Z79.01 Long term (current) use of anticoagulants 05/06/2022 No Yes Inactive Problems ICD-10 Code Description Active Date Inactive Date L89.153 Pressure ulcer of sacral region, stage 3 05/06/2022 05/06/2022 Resolved Problems ICD-10 Code Description Active Date Resolved Date L89.620 Pressure ulcer of left heel, unstageable 05/06/2022 05/06/2022 L89.510 Pressure ulcer of right ankle, unstageable 09/02/2022 09/02/2022 L89.523 Pressure ulcer of left ankle, stage 3 09/02/2022 09/02/2022 Electronic Signature(s) Signed: 02/04/2023 3:33:32 PM By: Baltazar Najjar MD Entered By: Baltazar Najjar on 02/03/2023 12:24:29 -------------------------------------------------------------------------------- Progress Note Details Patient Name: Date of Service: [name] ND, [name] 02/03/2023 2:15 PM Medical Record Number: [id number] Patient Account Number: [id number] Date of Birth/Sex: Treating RN: 12-20-1960 (62 y.o. [name] Primary Care Provider: Enid Baas Other Clinician: Referring Provider: Treating Provider/Extender: RO BSO N, MICHA EL Verdis Prime, [name] in Treatment: 39 Subjective History of Present Illness (HPI) [name], [name] ([id number]) [id number] Page 5 of 7 05/06/2022 [name] is a 62 year old female with a past medical history  of multiple sclerosis and PEs and DVT that presents to the clinic for sacral s wounds and Bilateral feet wounds. Patient states that she obtained the sacral ulcers when she was hospitalized in November 2023 for bilateral pulmonary embolisms. She was in the ICU on a ventilator. Since discharge she has been using Dakin's wet-to-dry dressings to the area. Since her hospitalization she has been bedbound. However she is doing physical therapy. Prior to being hospitalized she was able to pull herself up and transfer from bed to chair on her own. Due to her MS she is not fully mobile. Unfortunately she states that the wound has gotten larger on her sacrum despite Wound care. Patient has home health. On 04/16/2022 she was admitted to the hospital for severe sepsis secondary to acute UTI and sacral cellulitis. She completed 5 days of IV cefepime and vancomycin. She was not discharged with oral antibiotics. She states she developed the bilateral feet wounds at that time. She states she has bunny boots however she is not wearing them. She currently denies systemic signs of infection. 2/7; patient presents for follow-up. She had a bone biopsy done at last clinic visit that showed fragments of inflamed granulation tissue, necrotic material and bone with acute osteomyelitis. Culture grew Proteus mirabilis and Klebsiella pneumoniae. Patient is scheduled to see infectious disease next week. For now she has been doing Dakin's wet-to-dry dressings to the sacrum. She is not able to obtain Santyl and has been keeping the left heel covered. T the right foot where o there was a previous wound with scab however this has healed. This has remained closed. No open wound noted. She states over the past week she has had chills but no fevers, nausea/vomiting or purulent drainage. 2/21; patient presents for follow-up. She saw Dr. Joylene Draft on 2/15. She was started on oral Levaquin for her  much pressure on the lower coccyx area. 10/22; patient with a smaller ulcer on the lower sacrum/coccyx. We have been using silver collagen moistened with hydrogel change every 1 to 2 days. Last time she was here she had protuberant granulation which I knocked back with silver nitrate there is none of this today. Electronic Signature(s) Signed: 02/04/2023 3:33:32 PM By: Baltazar Najjar MD Entered By: Baltazar Najjar on 02/03/2023 12:26:05 -------------------------------------------------------------------------------- Physical Exam Details Patient Name: Date of Service: [name] ND, [name] 02/03/2023 2:15 PM Medical Record Number: [id number] Patient Account Number: [id number] Date of Birth/Sex: Treating RN: [birth year] (62 y.o. [name] Primary Care Provider: Enid Baas Other Clinician: Referring Provider: Treating Provider/Extender: RO BSO N, MICHA EL Verdis Prime, [name] in Treatment: 42 Constitutional Patient is hypotensive.Appears well however. Notes Wound exam; lower sacrum/coccyx. There is granulation. The area distally is about 1.5 cm again the granulation looks satisfactory. Less depth proximally although there is depth in this entire area. No evidence of surrounding infection Electronic Signature(s) Signed: 02/04/2023 3:33:32 PM By: Baltazar Najjar MD Entered By: Baltazar Najjar on 02/03/2023 12:30:41 -------------------------------------------------------------------------------- Physician Orders Details Patient Name: Date of Service: [name] ND, [name] 02/03/2023 2:15 PM Medical Record Number: [id number] Patient Account Number: [id number] Date of Birth/Sex: Treating RN: 20-Dec-1960 (62 y.o. 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 May utilize formulary equivalent dressing for wound treatment orders unless otherwise specified. Home Health Nurse may visit PRN to address patients wound care needs. - Collagen into the wound with hydrogel covered with small piece of ABD pad and paper tape. Wound Treatment Wound #3 - Sacrum Wound Laterality: Midline, Proximal Cleanser: Vashe 5.8 (oz) 1 x Per Day/30 Days Discharge Instructions: Use vashe 5.8 (oz) as directed Prim Dressing: Prisma 4.34 (in) (Home Health) 1 x Per Day/30 Days ary Discharge Instructions: Moisten w/normal saline or sterile water; Cover wound as directed. Do not remove from wound bed. Prim Dressing: Wound Hydrogel, 3 (oz), tube ary 1 x Per Day/30 Days Secondary Dressing: ABD Pad 5x9 (in/in) 1 x Per Day/30 Days Discharge Instructions: Cover with ABD pad Secured With: Paper Tape, 0.5x10 (in/yd) 1 x Per Day/30 Days Electronic Signature(s) Signed: 02/08/2023 1:44:37 PM By: Midge Aver MSN RN CNS WTA Signed: 02/10/2023 4:48:33 PM By: Baltazar Najjar MD Previous Signature: 02/05/2023 1:08:40 PM Version By: Midge Aver MSN RN CNS WTA Previous Signature: 02/07/2023 11:21:49 AM Version By: Baltazar Najjar MD Previous Signature: 02/03/2023 5:12:24 PM Version By: Midge Aver MSN RN CNS WTA Previous Signature: 02/04/2023 3:33:32 PM Version By: Baltazar Najjar MD Previous Signature: 02/03/2023 5:11:06 PM Version By: Midge Aver MSN RN CNS WTA Entered By: Midge Aver on 02/08/2023 10:44:36 -------------------------------------------------------------------------------- Problem List Details Patient Name: Date of Service: [name] STRA ND, [name] 02/03/2023 2:15 PM Medical Record Number: [id number] Patient  AKIRE, BONNO ([id number]) [id number] Page 1 of 7 Visit Report for 02/03/2023 HPI Details Patient Name: Date of Service: [name] STRA ND, [name] 02/03/2023 2:15 PM Medical Record Number: [id number] Patient Account Number: [id number] Date of Birth/Sex: Treating RN: [birth year] (62 y.o. [name] Primary Care Provider: Enid Baas Other Clinician: Referring Provider: Treating Provider/Extender: RO BSO N, MICHA EL Verdis Prime, [name] in Treatment: 39 History of Present Illness HPI Description: 05/06/2022 Ms. Pasqualina Winborn is a 62 year old female with a past medical history of multiple sclerosis and PEs and DVT that presents to the clinic for sacral s wounds and Bilateral feet wounds. Patient states that she obtained the sacral ulcers when she was hospitalized in November 2023 for bilateral pulmonary embolisms. She was in the ICU on a ventilator. Since discharge she has been using Dakin's wet-to-dry dressings to the area. Since her hospitalization she has been bedbound. However she is doing physical therapy. Prior to being hospitalized she was able to pull herself up and transfer from bed to chair on her own. Due to her MS she is not fully mobile. Unfortunately she states that the wound has gotten larger on her sacrum despite Wound care. Patient has home health. On 04/16/2022 she was admitted to the hospital for severe sepsis secondary to acute UTI and sacral cellulitis. She completed 5 days of IV cefepime and vancomycin. She was not discharged with oral antibiotics. She states she developed the bilateral feet wounds at that time. She states she has bunny boots however she is not wearing them. She currently denies systemic signs of infection. 2/7; patient presents for follow-up. She had a bone biopsy done at last clinic visit that showed fragments of inflamed granulation tissue, necrotic material and bone with acute osteomyelitis.  Culture grew Proteus mirabilis and Klebsiella pneumoniae. Patient is scheduled to see infectious disease next week. For now she has been doing Dakin's wet-to-dry dressings to the sacrum. She is not able to obtain Santyl and has been keeping the left heel covered. T the right foot where o there was a previous wound with scab however this has healed. This has remained closed. No open wound noted. She states over the past week she has had chills but no fevers, nausea/vomiting or purulent drainage. 2/21; patient presents for follow-up. She saw Dr. Joylene Draft on 2/15. 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 Today she had feces impacted in the wound and throughout the tunneling. We discussed a diverting colostomy to address this issue. She was agreeable with a referral to general surgery. She currently denies systemic signs of infection. She has been using Medihoney and Hydrofera Blue to the left heel wound. She has been using her Prevalon boots. 3/20; patient presents for follow-up. She has been using Dakin's wet-to-dry dressings to the sacral wound. She has developed a new wound to her right lateral heel. She is using Medihoney and Hydrofera Blue to the left heel. She is currently taking Levaquin to complete 6 [name]. She follows with infectious disease for this. She saw general surgery at [facility] to discuss potentially doing a diverting colostomy however per patient's surgeon did not recommend this. 4/17; patient presents for follow-up. Has been using Dakin's wet-to-dry dressings to the sacral wound. She has been using Medihoney and Hydrofera Blue to the heel wounds. She states she is using her bunny boots for offloading the heels. She has not heard from plastic surgery for consultation. We gave her the number to call to follow this  Account Number: [id number] Date of Birth/Sex: Treating RN: 1960-07-22 (62 y.o. 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, sacral and sacrococcygeal region 05/20/2022 No Yes G35 Multiple sclerosis 05/06/2022 No Yes I82.409 Acute embolism and thrombosis of unspecified deep veins of unspecified 05/06/2022 No Yes lower extremity Z79.01 Long term (current) use of anticoagulants 05/06/2022 No Yes Inactive Problems ICD-10 Code Description Active Date Inactive Date L89.153 Pressure ulcer of sacral region, stage 3 05/06/2022 05/06/2022 Resolved Problems ICD-10 Code Description Active Date Resolved Date L89.620 Pressure ulcer of left heel, unstageable 05/06/2022 05/06/2022 L89.510 Pressure ulcer of right ankle, unstageable 09/02/2022 09/02/2022 L89.523 Pressure ulcer of left ankle, stage 3 09/02/2022 09/02/2022 Electronic Signature(s) Signed: 02/04/2023 3:33:32 PM By: Baltazar Najjar MD Entered By: Baltazar Najjar on 02/03/2023 12:24:29 -------------------------------------------------------------------------------- Progress Note Details Patient Name: Date of Service: [name] ND, [name] 02/03/2023 2:15 PM Medical Record Number: [id number] Patient Account Number: [id number] Date of Birth/Sex: Treating RN: 12-20-1960 (62 y.o. [name] Primary Care Provider: Enid Baas Other Clinician: Referring Provider: Treating Provider/Extender: RO BSO N, MICHA EL Verdis Prime, [name] in Treatment: 39 Subjective History of Present Illness (HPI) [name], [name] ([id number]) [id number] Page 5 of 7 05/06/2022 [name] is a 62 year old female with a past medical history  of multiple sclerosis and PEs and DVT that presents to the clinic for sacral s wounds and Bilateral feet wounds. Patient states that she obtained the sacral ulcers when she was hospitalized in November 2023 for bilateral pulmonary embolisms. She was in the ICU on a ventilator. Since discharge she has been using Dakin's wet-to-dry dressings to the area. Since her hospitalization she has been bedbound. However she is doing physical therapy. Prior to being hospitalized she was able to pull herself up and transfer from bed to chair on her own. Due to her MS she is not fully mobile. Unfortunately she states that the wound has gotten larger on her sacrum despite Wound care. Patient has home health. On 04/16/2022 she was admitted to the hospital for severe sepsis secondary to acute UTI and sacral cellulitis. She completed 5 days of IV cefepime and vancomycin. She was not discharged with oral antibiotics. She states she developed the bilateral feet wounds at that time. She states she has bunny boots however she is not wearing them. She currently denies systemic signs of infection. 2/7; patient presents for follow-up. She had a bone biopsy done at last clinic visit that showed fragments of inflamed granulation tissue, necrotic material and bone with acute osteomyelitis. Culture grew Proteus mirabilis and Klebsiella pneumoniae. Patient is scheduled to see infectious disease next week. For now she has been doing Dakin's wet-to-dry dressings to the sacrum. She is not able to obtain Santyl and has been keeping the left heel covered. T the right foot where o there was a previous wound with scab however this has healed. This has remained closed. No open wound noted. She states over the past week she has had chills but no fevers, nausea/vomiting or purulent drainage. 2/21; patient presents for follow-up. She saw Dr. Joylene Draft on 2/15. She was started on oral Levaquin for her  wound is smaller. She is has no issues or complaints today. Home health has been coming out and changing the wound VAC 3x A week. 9/11; patient presents for follow-up. Patient elected to stop the wound VAC 2 [name] ago as it has been a hassle to keep a seal. She has been using Dakin's wet-to-dry dressings. She has no issues or complaints. She denies signs of infection. 9/25; 2-week follow-up for this patient with advanced  MS. 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 This is apparently come in quite a bit. They have been using Dakin's wet-to-dry for quite a period of time now. Her boyfriend changes the dressing. She is careful about offloading this, has a wheelchair cushion etc. 10/9; 2-week follow-up. Substantially improved sacral wound. She has been left with a small wound. I changed her to Prisma last week to see if we can stimulate some granulation to the small remaining area. It sounds as though she is up in a wheelchair but her friend is careful to manipulate this so she is not putting too much pressure on the lower coccyx area. 10/22; patient with a smaller ulcer on the lower sacrum/coccyx. We have been using silver collagen moistened with hydrogel change every 1 to 2 days. Last time she was here she had protuberant granulation which I knocked back with silver nitrate there is none of this today. Objective Constitutional Patient is hypotensive.Appears well however. Vitals Time Taken: 2:46 PM, Temperature: 98.3 F, Pulse: 82 bpm, Respiratory Rate: 18 breaths/min, Blood Pressure: 89/61 mmHg. General Notes: Wound exam; lower sacrum/coccyx. There is granulation. The area distally is about 1.5 cm again the granulation looks satisfactory. Less depth proximally although there is depth in this entire area. No evidence of surrounding infection Integumentary (Hair, Skin) [name], [name] ([id number]) [id number] Page 6 of 7 Wound #3 status is Open. Original cause of wound was Pressure Injury. The date acquired was: 03/09/2022. The wound has been in treatment 39 [name]. The wound is located on the Proximal,Midline Sacrum. The wound measures 0.7cm length x 0.4cm width x 1cm depth; 0.22cm^2 area and 0.22cm^3 volume. There is muscle and Fat Layer (Subcutaneous Tissue) exposed. There is a large amount of serosanguineous drainage noted. The wound margin is epibole. There  is large (67-100%) red, hyper - granulation within the wound bed. There is a small (1-33%) amount of necrotic tissue within the wound bed. 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 The granulation here looks reasonable. I am continuing the silver collagen based dressings for now 2. Run Oasis through her insurance. This would be to hopefully stimulate additional granulation 3. I have asked them not to be up in their wheelchair for more than 3 hours irrespective of what position the wheelchair is in Electronic Signature(s) Signed: 02/04/2023 3:33:32 PM By: Baltazar Najjar MD Entered By: Baltazar Najjar on 02/03/2023 12:31:36 -------------------------------------------------------------------------------- SuperBill Details Patient Name: Date of Service: [name] ND, [name] 02/03/2023 Medical Record Number: [id number] Patient Account Number: [id number] Date of Birth/Sex: Treating RN: Aug 23, 1960 (62 y.o. [name] Primary Care Provider: Enid Baas Other Clinician: Referring Provider: Treating Provider/Extender: RO BSO N, MICHA EL Verdis Prime, [name] in Treatment: 39 Diagnosis Coding ICD-10 Codes Code Description L89.154 Pressure ulcer of sacral region, stage  wound is smaller. She is has no issues or complaints today. Home health has been coming out and changing the wound VAC 3x A week. 9/11; patient presents for follow-up. Patient elected to stop the wound VAC 2 [name] ago as it has been a hassle to keep a seal. She has been using Dakin's wet-to-dry dressings. She has no issues or complaints. She denies signs of infection. 9/25; 2-week follow-up for this patient with advanced  MS. 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 This is apparently come in quite a bit. They have been using Dakin's wet-to-dry for quite a period of time now. Her boyfriend changes the dressing. She is careful about offloading this, has a wheelchair cushion etc. 10/9; 2-week follow-up. Substantially improved sacral wound. She has been left with a small wound. I changed her to Prisma last week to see if we can stimulate some granulation to the small remaining area. It sounds as though she is up in a wheelchair but her friend is careful to manipulate this so she is not putting too much pressure on the lower coccyx area. 10/22; patient with a smaller ulcer on the lower sacrum/coccyx. We have been using silver collagen moistened with hydrogel change every 1 to 2 days. Last time she was here she had protuberant granulation which I knocked back with silver nitrate there is none of this today. Objective Constitutional Patient is hypotensive.Appears well however. Vitals Time Taken: 2:46 PM, Temperature: 98.3 F, Pulse: 82 bpm, Respiratory Rate: 18 breaths/min, Blood Pressure: 89/61 mmHg. General Notes: Wound exam; lower sacrum/coccyx. There is granulation. The area distally is about 1.5 cm again the granulation looks satisfactory. Less depth proximally although there is depth in this entire area. No evidence of surrounding infection Integumentary (Hair, Skin) [name], [name] ([id number]) [id number] Page 6 of 7 Wound #3 status is Open. Original cause of wound was Pressure Injury. The date acquired was: 03/09/2022. The wound has been in treatment 39 [name]. The wound is located on the Proximal,Midline Sacrum. The wound measures 0.7cm length x 0.4cm width x 1cm depth; 0.22cm^2 area and 0.22cm^3 volume. There is muscle and Fat Layer (Subcutaneous Tissue) exposed. There is a large amount of serosanguineous drainage noted. The wound margin is epibole. There  is large (67-100%) red, hyper - granulation within the wound bed. There is a small (1-33%) amount of necrotic tissue within the wound bed. 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 The granulation here looks reasonable. I am continuing the silver collagen based dressings for now 2. Run Oasis through her insurance. This would be to hopefully stimulate additional granulation 3. I have asked them not to be up in their wheelchair for more than 3 hours irrespective of what position the wheelchair is in Electronic Signature(s) Signed: 02/04/2023 3:33:32 PM By: Baltazar Najjar MD Entered By: Baltazar Najjar on 02/03/2023 12:31:36 -------------------------------------------------------------------------------- SuperBill Details Patient Name: Date of Service: [name] ND, [name] 02/03/2023 Medical Record Number: [id number] Patient Account Number: [id number] Date of Birth/Sex: Treating RN: Aug 23, 1960 (62 y.o. [name] Primary Care Provider: Enid Baas Other Clinician: Referring Provider: Treating Provider/Extender: RO BSO N, MICHA EL Verdis Prime, [name] in Treatment: 39 Diagnosis Coding ICD-10 Codes Code Description L89.154 Pressure ulcer of sacral region, stage

## 2023-02-17 ENCOUNTER — Encounter: Payer: Medicare Other | Attending: Physician Assistant | Admitting: Physician Assistant

## 2023-02-17 DIAGNOSIS — Z86718 Personal history of other venous thrombosis and embolism: Secondary | ICD-10-CM | POA: Diagnosis not present

## 2023-02-17 DIAGNOSIS — G35 Multiple sclerosis: Secondary | ICD-10-CM | POA: Diagnosis not present

## 2023-02-17 DIAGNOSIS — L89154 Pressure ulcer of sacral region, stage 4: Secondary | ICD-10-CM | POA: Insufficient documentation

## 2023-02-17 DIAGNOSIS — M4628 Osteomyelitis of vertebra, sacral and sacrococcygeal region: Secondary | ICD-10-CM | POA: Diagnosis not present

## 2023-02-17 DIAGNOSIS — Z7901 Long term (current) use of anticoagulants: Secondary | ICD-10-CM | POA: Insufficient documentation

## 2023-02-17 DIAGNOSIS — Z86711 Personal history of pulmonary embolism: Secondary | ICD-10-CM | POA: Insufficient documentation

## 2023-02-17 NOTE — Progress Notes (Signed)
[name] ([id number]) [id number] Page 1 of 9 Visit Report for 02/17/2023 Arrival Information Details Patient Name: Date of Service: [name] STRA [name], [name] 02/17/2023 2:15 PM Medical Record Number: [id number] Patient Account Number: [id number] Date of Birth/Sex: Treating RN: [birth year] (62 y.o. [name] Primary Care [name]: [name] Other Clinician: Referring [name]: Treating [name]/Extender: [name] in Treatment: 57 Visit Information History Since Last Visit Added or deleted any medications: No Patient Arrived: Wheel Chair Any new allergies or adverse reactions: No Arrival Time: 14:22 Has Dressing in Place as Prescribed: Yes Accompanied By: boyfriend Pain Present Now: No Transfer Assistance: Transfer Board Patient Identification Verified: Yes Secondary Verification Process Completed: Yes Patient Requires Transmission-Based Precautions: No Patient Has Alerts: Yes Patient Alerts: Patient on Blood Thinner Not Diabetic Xarelto Electronic Signature(s) Signed: 02/17/2023 4:45:53 PM By: [name] Aver MSN RN CNS WTA Entered By: [name] on 02/17/2023 16:45:52 -------------------------------------------------------------------------------- Clinic Level of Care Assessment Details Patient Name: Date of Service: [name] STRA [name], [name] 02/17/2023 2:15 PM Medical Record Number: [id number] Patient Account Number: [id number] Date of Birth/Sex: Treating RN: 11-20-1960 (62 y.o. [name] Primary Care [name]: [name] Other Clinician: Referring [name]: Treating [name]/Extender: [name] in Treatment: 109 Clinic Level of Care Assessment Items TOOL 4 Quantity Score X- 1 0 Use when only an EandM is performed on FOLLOW-UP visit ASSESSMENTS - Nursing Assessment / Reassessment X- 1 10 Reassessment of Co-morbidities (includes updates in patient  status) X- 1 5 Reassessment of Adherence to Treatment Plan ASSESSMENTS - Wound and Skin A ssessment / Reassessment X - Simple Wound Assessment / Reassessment - one wound 1 5 [name], [name] ([id number]) [id number] Page 2 of 9 []  - 0 Complex Wound Assessment / Reassessment - multiple wounds []  - 0 Dermatologic / Skin Assessment (not related to wound area) ASSESSMENTS - Focused Assessment []  - 0 Circumferential Edema Measurements - multi extremities []  - 0 Nutritional Assessment / Counseling / Intervention []  - 0 Lower Extremity Assessment (monofilament, tuning fork, pulses) []  - 0 Peripheral Arterial Disease Assessment (using hand held doppler) ASSESSMENTS - Ostomy and/or Continence Assessment and Care []  - 0 Incontinence Assessment and Management []  - 0 Ostomy Care Assessment and Management (repouching, etc.) PROCESS - Coordination of Care X - Simple Patient / Family Education for ongoing care 1 15 []  - 0 Complex (extensive) Patient / Family Education for ongoing care X- 1 10 Staff obtains Chiropractor, Records, T Results / Process Orders est []  - 0 Staff telephones HHA, Nursing Homes / Clarify orders / etc []  - 0 Routine Transfer to another Facility (non-emergent condition) []  - 0 Routine Hospital Admission (non-emergent condition) []  - 0 New Admissions / Manufacturing engineer / Ordering NPWT Apligraf, etc. , []  - 0 Emergency Hospital Admission (emergent condition) X- 1 10 Simple Discharge Coordination []  - 0 Complex (extensive) Discharge Coordination PROCESS - Special Needs []  - 0 Pediatric / Minor Patient Management []  - 0 Isolation Patient Management []  - 0 Hearing / Language / Visual special needs []  - 0 Assessment of Community assistance (transportation, D/C planning, etc.) []  - 0 Additional assistance / Altered mentation []  - 0 Support Surface(s) Assessment (bed, cushion, seat, etc.) INTERVENTIONS - Wound Cleansing /  Measurement X - Simple Wound Cleansing - one wound 1 5 []  - 0 Complex Wound Cleansing - multiple wounds X- 1 5 Wound Imaging (photographs - any number of wounds) []  - 0 Wound Tracing (instead of photographs) X-  1 5 Simple Wound Measurement - one wound []  - 0 Complex Wound Measurement - multiple wounds INTERVENTIONS - Wound Dressings X - Small Wound Dressing one or multiple wounds 1 10 []  - 0 Medium Wound Dressing one or multiple wounds []  - 0 Large Wound Dressing one or multiple wounds []  - 0 Application of Medications - topical []  - 0 Application of Medications - injection INTERVENTIONS - Miscellaneous []  - 0 External ear exam []  - 0 Specimen Collection (cultures, biopsies, blood, body fluids, etc.) [name] ([id number]) [id number] Page 3 of 9 []  - 0 Specimen(s) / Culture(s) sent or taken to Lab for analysis X- 1 10 Patient Transfer (multiple staff / Michiel Sites Lift / Similar devices) []  - 0 Simple Staple / Suture removal (25 or less) []  - 0 Complex Staple / Suture removal (26 or more) []  - 0 Hypo / Hyperglycemic Management (close monitor of Blood Glucose) []  - 0 Ankle / Brachial Index (ABI) - do not check if billed separately X- 1 5 Vital Signs Has the patient been seen at the hospital within the last three years: Yes Total Score: 95 Level Of Care: New/Established - Level 3 Electronic Signature(s) Signed: 02/18/2023 4:24:45 PM By: [name] Aver MSN RN CNS WTA Entered By: [name] on 02/17/2023 16:53:14 -------------------------------------------------------------------------------- Encounter Discharge Information Details Patient Name: Date of Service: [name] [name], [name] 02/17/2023 2:15 PM Medical Record Number: [id number] Patient Account Number: [id number] Date of Birth/Sex: Treating RN: June 13, 1960 (62 y.o. [name] Primary Care [name]: [name] Other Clinician: Referring [name]: Treating  [name]/Extender: [name] in Treatment: 82 Encounter Discharge Information Items Discharge Condition: Stable Ambulatory Status: Wheelchair Discharge Destination: Home Transportation: Private Auto Accompanied By: boyfriend Schedule Follow-up Appointment: Yes Clinical Summary of Care: Electronic Signature(s) Signed: 02/17/2023 4:52:51 PM By: [name] Aver MSN RN CNS WTA Entered By: [name] on 02/17/2023 16:52:51 -------------------------------------------------------------------------------- Lower Extremity Assessment Details Patient Name: Date of Service: [name] [name], [name] 02/17/2023 2:15 PM Medical Record Number: [id number] Patient Account Number: [id number] Date of Birth/Sex: Treating RN: [birth year] (62 y.o. Jaritza, Duignan, Lainy ([id number]) [id number] Page 4 of 9 Primary Care Reubin Bushnell: [name] Other Clinician: Referring Elfego Giammarino: Treating Tanysha Quant/Extender: [name] in Treatment: 80 Electronic Signature(s) Signed: 02/17/2023 4:48:36 PM By: [name] Aver MSN RN CNS WTA Entered By: [name] on 02/17/2023 16:48:36 -------------------------------------------------------------------------------- Multi Wound Chart Details Patient Name: Date of Service: [name] [name], [name] 02/17/2023 2:15 PM Medical Record Number: [id number] Patient Account Number: [id number] Date of Birth/Sex: Treating RN: 05/16/1960 (62 y.o. [name] Primary Care Ismael Karge: [name] Other Clinician: Referring Jordynne Mccown: Treating Jaide Hillenburg/Extender: [name] in Treatment: 62 Vital Signs Height(in): Pulse(bpm): 82 Weight(lbs): Blood Pressure(mmHg): 107/72 Body Mass Index(BMI): Temperature(F): 97.9 Respiratory Rate(breaths/min): 18 [3:Photos:] [N/A:N/A] Proximal, Midline Sacrum N/A N/A Wound Location: Pressure Injury N/A N/A Wounding  Event: Pressure Ulcer N/A N/A Primary Etiology: Angina, Hypotension N/A N/A Comorbid History: 03/09/2022 N/A N/A Date Acquired: 61 N/A N/A [name] of Treatment: Open N/A N/A Wound Status: No N/A N/A Wound Recurrence: 0.7x0.2x1.5 N/A N/A Measurements L x W x D (cm) 0.11 N/A N/A A (cm) : rea 0.165 N/A N/A Volume (cm) : 99.50% N/A N/A % Reduction in A rea: 99.90% N/A N/A % 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 N/A N/A Classification: Large N/A N/A Exudate A mount: Serosanguineous N/A N/A Exudate Type: red, brown N/A N/A Exudate Color: Epibole N/A N/A Wound  Margin: Large (67-100%) N/A N/A Granulation A mount: Red, Hyper-granulation N/A N/A Granulation Quality: Small (1-33%) N/A N/A Necrotic A mount: Fat Layer (Subcutaneous Tissue): Yes N/A N/A Exposed Structures: Muscle: Yes Fascia: No Tendon: No Joint: No Bone: No None N/A N/A EpithelializationArnetha [name] ([id number]) [id number] Page 5 of 9 Treatment Notes Electronic Signature(s) Signed: 02/17/2023 4:49:23 PM By: [name] Aver MSN RN CNS WTA Entered By: [name] on 02/17/2023 16:49:23 -------------------------------------------------------------------------------- Multi-Disciplinary Care Plan Details Patient Name: Date of Service: [name] [name], [name] 02/17/2023 2:15 PM Medical Record Number: [id number] Patient Account Number: [id number] Date of Birth/Sex: Treating RN: May 26, 1960 (62 y.o. [name] Primary Care Deward Sebek: [name] Other Clinician: Referring Eathen Budreau: Treating Marqui Formby/Extender: [name] in Treatment: 57 Active Inactive Wound/Skin Impairment Nursing Diagnoses: Impaired tissue integrity Knowledge deficit related to ulceration/compromised skin integrity Goals: Patient/caregiver will verbalize understanding of skin care regimen Date Initiated: 05/06/2022 Date Inactivated: 07/29/2022 Target  Resolution Date: 08/28/2022 Goal Status: Met Ulcer/skin breakdown will have a volume reduction of 30% by week 4 Date Initiated: 05/06/2022 Date Inactivated: 07/29/2022 Target Resolution Date: 06/06/2022 Goal Status: Unmet Unmet Reason: not 30% decrease Ulcer/skin breakdown will have a volume reduction of 50% by week 8 Date Initiated: 05/20/2022 Date Inactivated: 07/29/2022 Target Resolution Date: 07/05/2022 Unmet Reason: not 50% decrease in Goal Status: Unmet size Ulcer/skin breakdown will have a volume reduction of 80% by week 12 Date Initiated: 05/20/2022 Date Inactivated: 07/29/2022 Target Resolution Date: 08/05/2022 Unmet Reason: not 80% decrease in Goal Status: Unmet size Ulcer/skin breakdown will heal within 14 [name] Date Initiated: 05/20/2022 Target Resolution Date: 02/08/2023 Goal Status: Active Interventions: Assess patient/caregiver ability to perform ulcer/skin care regimen upon admission and as needed Assess ulceration(s) every visit Provide education on ulcer and skin care Screen for HBO Treatment Activities: Skin care regimen initiated : 05/06/2022 Topical wound management initiated : 05/06/2022 Notes: Electronic Signature(s) Signed: 02/17/2023 4:51:46 PM By: [name] Aver MSN RN CNS WTA Entered By: [name] on 02/17/2023 16:51:45 [name] ([id number]) [id number] Page 6 of 9 -------------------------------------------------------------------------------- Pain Assessment Details Patient Name: Date of Service: [name] [name], [name] 02/17/2023 2:15 PM Medical Record Number: [id number] Patient Account Number: [id number] Date of Birth/Sex: Treating RN: Jul 27, 1960 (62 y.o. [name] Primary Care Kebron Pulse: [name] Other Clinician: Referring Zenith Kercheval: Treating Mikyah Alamo/Extender: [name] in Treatment: 59 Active Problems Location of Pain Severity and Description of Pain Patient Has  Paino No Site Locations Pain Management and Medication Current Pain Management: Electronic Signature(s) Signed: 02/17/2023 4:48:08 PM By: [name] Aver MSN RN CNS WTA Entered By: [name] on 02/17/2023 16:48:08 -------------------------------------------------------------------------------- Patient/Caregiver Education Details Patient Name: Date of Service: [name] [name], [name] 11/6/2024andnbsp2:15 PM Medical Record Number: [id number] Patient Account Number: [id number] Date of Birth/Gender: Treating RN: April 22, 1960 (62 y.o. [name] Primary Care Physician: [name] Other Clinician: Referring Physician: Treating Physician/Extender: [name] Birks [place], [place] ([id number]) [id number] Page 7 of 9 [name] in Treatment: 73 Education Assessment Education Provided To: Patient Education Topics Provided Wound/Skin Impairment: Handouts: Caring for Your Ulcer Methods: Explain/Verbal Responses: State content correctly Electronic Signature(s) Signed: 02/18/2023 4:24:45 PM By: [name] Aver MSN RN CNS WTA Entered By: [name] on 02/17/2023 16:51:56 -------------------------------------------------------------------------------- Wound Assessment Details Patient Name: Date of Service: [name] [name], [name] 02/17/2023 2:15 PM Medical Record Number: [id number] Patient Account Number: [id number] Date of Birth/Sex: Treating RN: January 10, 1961 (62 y.o. [name] Primary Care  Jyden Kromer: [name] Other Clinician: Referring Maryfer Tauzin: Treating Christion Leonhard/Extender: Dorthula Nettles, [place] [name] in Treatment: 41 Wound Status Wound Number: 3 Primary Etiology: Pressure Ulcer Wound Location: Proximal, Midline Sacrum Wound Status: Open Wounding Event: Pressure Injury Comorbid History: Angina, Hypotension Date Acquired: 03/09/2022 [name] Of Treatment: 41 Clustered Wound: No Photos Wound  Measurements Length: (cm) 0.7 Width: (cm) 0.2 Depth: (cm) 1.5 Area: (cm) 0.11 Volume: (cm) 0.165 % Reduction in Area: 99.5% % Reduction in Volume: 99.9% Epithelialization: None Wound Description Classification: Category/Stage IV Casebier, Freida ([id number]) Wound Margin: Epibole Exudate Amount: Large Exudate Type: Serosanguineous Exudate Color: red, brown Foul Odor After Cleansing: No [id number] Page 8 of 9 Slough/Fibrino Yes Wound Bed Granulation Amount: Large (67-100%) Exposed Structure Granulation Quality: Red, Hyper-granulation Fascia Exposed: No Necrotic Amount: Small (1-33%) Fat Layer (Subcutaneous Tissue) Exposed: Yes Tendon Exposed: No Muscle Exposed: Yes Necrosis of Muscle: Joint Exposed: No Bone Exposed: No Treatment Notes Wound #3 (Sacrum) Wound Laterality: Midline, Proximal Cleanser Vashe 5.8 (oz) Discharge Instruction: Use vashe 5.8 (oz) as directed Peri-Wound Care Topical Primary Dressing Hydrofera Blue Classic Foam Rope Dressing, 9x6 (mm/in) Secondary Dressing ABD Pad 5x9 (in/in) Discharge Instruction: Cover with ABD pad Secured With Paper Tape, 0.5x10 (in/yd) Compression Wrap Compression Stockings Add-Ons Electronic Signature(s) Signed: 02/18/2023 4:24:45 PM By: [name] Aver MSN RN CNS WTA Entered By: [name] on 02/17/2023 14:37:14 -------------------------------------------------------------------------------- Vitals Details Patient Name: Date of Service: [name] [name], [name] 02/17/2023 2:15 PM Medical Record Number: [id number] Patient Account Number: [id number] Date of Birth/Sex: Treating RN: Sep 03, 1960 (62 y.o. [name] Primary Care Eloina Ergle: [name] Other Clinician: Referring Primus Gritton: Treating Mearl Olver/Extender: [name] in Treatment: 32 Vital Signs Time Taken: 14:31 Temperature (F): 97.9 Pulse (bpm): 82 Respiratory Rate (breaths/min): 18 Blood  Pressure (mmHg): 107/72 Reference Range: 80 - 120 mg / dl RAFEEF, LAU ([id number]) [phone].pdf Page 9 of 9 Electronic Signature(s) Signed: 02/17/2023 4:45:56 PM By: [name] Aver MSN RN CNS WTA Entered By: [name] on 02/17/2023 16:45:56

## 2023-02-17 NOTE — Progress Notes (Signed)
[name], [name] ([id number]) [id number] Page 1 of 7 Visit Report for 02/17/2023 Chief Complaint Document Details Patient Name: Date of Service: [name] STRA ND, [name] 02/17/2023 2:15 PM Medical Record Number: [id number] Patient Account Number: [id number] Date of Birth/Sex: Treating RN: 01-25-1961 (62 y.o. [name] Primary Care Provider: Enid Baas Other Clinician: Referring Provider: Treating Provider/Extender: Fredrich Birks Weeks in Treatment: 33 Information Obtained from: Patient Chief Complaint 05/06/2022; Bilateral feet wounds and sacral wounds Electronic Signature(s) Signed: 02/17/2023 2:20:12 PM By: Allen Derry PA-C Entered By: Allen Derry on 02/17/2023 14:20:12 -------------------------------------------------------------------------------- HPI Details Patient Name: Date of Service: [name] ND, [name] 02/17/2023 2:15 PM Medical Record Number: [id number] Patient Account Number: [id number] Date of Birth/Sex: Treating RN: 1960-05-13 (62 y.o. [name] Primary Care Provider: Enid Baas Other Clinician: Referring Provider: Treating Provider/Extender: Fredrich Birks Weeks in Treatment: 74 History of Present Illness HPI Description: 05/06/2022 [name] is a 62 year old female with a past medical history of multiple sclerosis and PEs and DVT that presents to the clinic for sacral s wounds and Bilateral feet wounds. Patient states that she obtained the sacral ulcers when she was hospitalized in November 2023 for bilateral pulmonary embolisms. She was in the ICU on a ventilator. Since discharge she has been using Dakin's wet-to-dry dressings to the area. Since her hospitalization she has been bedbound. However she is doing physical therapy. Prior to being hospitalized she was able to pull herself up and transfer from bed to chair on her own. Due to her MS she is not  fully mobile. Unfortunately she states that the wound has gotten larger on her sacrum despite Wound care. Patient has home health. On 04/16/2022 she was admitted to the hospital for severe sepsis secondary to acute UTI and sacral cellulitis. She completed 5 days of IV cefepime and vancomycin. She was not discharged with oral antibiotics. She states she developed the bilateral feet wounds at that time. She states she has bunny boots however she is not wearing them. She currently denies systemic signs of infection. 2/7; patient presents for follow-up. She had a bone biopsy done at last clinic visit that showed fragments of inflamed granulation tissue, necrotic material and bone with acute osteomyelitis. Culture grew Proteus mirabilis and Klebsiella pneumoniae. Patient is scheduled to see infectious disease next week. For now she has been doing Dakin's wet-to-dry dressings to the sacrum. She is not able to obtain Santyl and has been keeping the left heel covered. T the right foot where o there was a previous wound with scab however this has healed. This has remained closed. No open wound noted. She states over the past week she has had chills but no fevers, nausea/vomiting or purulent drainage. 2/21; patient presents for follow-up. She saw Dr. Joylene Draft on 2/15. 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 Today she had feces Darwin, [name] ([id number]) [id number] Page 2 of 7 impacted in the wound and throughout the tunneling. We discussed a diverting colostomy to address this issue. She was agreeable with a referral to general surgery. She currently denies systemic signs of infection. She has been using Medihoney and Hydrofera Blue to the left heel wound. She has been using her Prevalon boots. 3/20; patient presents for follow-up. She has been using Dakin's wet-to-dry dressings to the sacral wound. She has developed a new wound to her right  lateral heel. She is using Medihoney and Hydrofera Blue to the left heel. She is  currently taking Levaquin to complete 6 weeks. She follows with infectious disease for this. She saw general surgery at [facility] to discuss potentially doing a diverting colostomy however per patient's surgeon did not recommend this. 4/17; patient presents for follow-up. Has been using Dakin's wet-to-dry dressings to the sacral wound. She has been using Medihoney and Hydrofera Blue to the heel wounds. She states she is using her bunny boots for offloading the heels. She has not heard from plastic surgery for consultation. We gave her the number to call to follow this up. 5/22; patient presents for follow-up. She saw Dr. Ferd Hibbs on 5/8, plastic surgery to discuss potentially doing surgical debridement with muscle flap. Per Dr. Timothy Lasso note Review of the surgery and requirements were discussed with the patient. Patient would like to hold off on proceeding with this option. Wound VAC was recommended and based on the wound today this option seemed reasonable to get started. Patient has home health that will be able to change the wound VAC. She has been using Dakin's wet-to-dry dressings to the sacrum. Patient has a new wound to the left medial ankle. She states she wears bunny boots but does not while in the wheelchair. 6/1; the patient has started the wound VAC as of Monday to the sacral area. Home health is out to change the dressing. She also has wounds on her bilateral ankles and left heel she is using Medihoney and Hydrofera Blue to these areas. She has advanced MS with contractures. Both the patient and her husband say she is eating a high-protein dilated well. She has some form of air mattress but the bed is from sleep number. I am uncertain about the offloading part of this 6/12; patient presents for follow-up. She has had a wound VAC to the sacral wound and home health has been changing this. She has had no issues with  this. She has been using Hydrofera Blue and Medihoney to the bilateral ankle and left heel wounds. The right ankle wound is healed. 6/26; patient presents for follow-up. Has been using a wound VAC to the sacral wound. She has no issues with this. Wound is smaller. She had ABIs on the right that were 1.27 and on the left that was 1.35 with a TBI of 1.18. She had triphasic waveforms throughout. She is been using Hydrofera Blue and Medihoney to the left heel wounds. These have closed. She denies signs of infection. 7/10; patient presents for follow-up. She continues to use a wound VAC to the sacral wound. She has slight skin irritation to the periwound from the drape. Overall wound appears healing. She has no other open wounds to her lower extremities. She currently denies systemic signs of infection. 7/24; patient presents for follow-up. She continues to use the wound VAC to the sacral wound. There is no issues or complaints. 8/7; patient presents for follow-up. She has been using the wound VAC with benefit. The wound is smaller. She is has no issues or complaints today. Home health has been coming out and changing the wound VAC 3x A week. 9/11; patient presents for follow-up. Patient elected to stop the wound VAC 2 weeks ago as it has been a hassle to keep a seal. She has been using Dakin's wet-to-dry dressings. She has no issues or complaints. She denies signs of infection. 9/25; 2-week follow-up for this patient with advanced MS. 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 This is apparently come in quite a bit. They have been using  Dakin's wet-to-dry for quite a period of time now. Her boyfriend changes the dressing. She is careful about offloading this, has a wheelchair cushion etc. 10/9; 2-week follow-up. Substantially improved sacral wound. She has been left with a small wound. I changed her to Prisma last week to see if we can stimulate some granulation to the small remaining  area. It sounds as though she is up in a wheelchair but her friend is careful to manipulate this so she is not putting too much pressure on the lower coccyx area. 10/22; patient with a smaller ulcer on the lower sacrum/coccyx. We have been using silver collagen moistened with hydrogel change every 1 to 2 days. Last time she was here she had protuberant granulation which I knocked back with silver nitrate there is none of this today. 02-17-2023 upon evaluation today patient's wound actually showing signs of having a small pocket fortunately there is no evidence of infection she has made a lot of progress here but this is still very difficult to get the close completely as far as the depth is concerned. Fortunately I do not see any signs of infection at this time. Electronic Signature(s) Signed: 02/17/2023 3:21:59 PM By: Allen Derry PA-C Entered By: Allen Derry on 02/17/2023 15:21:59 -------------------------------------------------------------------------------- Physical Exam Details Patient Name: Date of Service: [name] ND, [name] 02/17/2023 2:15 PM Medical Record Number: [id number] Patient Account Number: [id number] Date of Birth/Sex: Treating RN: 02/12/1961 (62 y.o. [name] Primary Care Provider: Enid Baas Other Clinician: Referring Provider: Treating Provider/Extender: Fredrich Birks Weeks in Treatment: [address], [place] ([id number]) [id number] Page 3 of 7 Constitutional Well-nourished and well-hydrated in no acute distress. Respiratory normal breathing without difficulty. Psychiatric this patient is able to make decisions and demonstrates good insight into disease process. Alert and Oriented x 3. pleasant and cooperative. Notes Patient's wound bed actually showed signs of good granulation epithelization at this point. Fortunately I do not see any evidence of worsening overall and I do believe that the patient  is making excellent headway towards closure. Electronic Signature(s) Signed: 02/17/2023 3:22:13 PM By: Allen Derry PA-C Entered By: Allen Derry on 02/17/2023 15:22:13 -------------------------------------------------------------------------------- Physician Orders Details Patient Name: Date of Service: [name] ND, [name] 02/17/2023 2:15 PM Medical Record Number: [id number] Patient Account Number: [id number] Date of Birth/Sex: Treating RN: 12/20/1960 (62 y.o. [name] Primary Care Provider: Enid Baas Other Clinician: Referring Provider: Treating Provider/Extender: Fredrich Birks Weeks in Treatment: 33 The following information was scribed by: Midge Aver The information was scribed for: Allen Derry Verbal / Phone Orders: No Diagnosis Coding ICD-10 Coding Code Description L89.154 Pressure ulcer of sacral region, stage 4 I26.99 Other pulmonary embolism without acute cor pulmonale 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, sacral and sacrococcygeal region G35 Multiple sclerosis I82.409 Acute embolism and thrombosis of unspecified deep veins of unspecified lower extremity Z79.01 Long term (current) use of anticoagulants Home Health Home Health Company: - [facility] CONTINUE Home Health for wound care. May utilize formulary equivalent dressing for wound treatment orders unless otherwise specified. Home Health Nurse may visit PRN to address patients wound care needs. - Hydrofera blue rope inserted into the wound covered with small piece of ABD pad and paper tape. Wound Treatment Wound #3 - Sacrum Wound Laterality: Midline, Proximal Cleanser: Vashe 5.8 (oz) 1 x Per Day/30 Days Discharge Instructions: Use vashe 5.8 (oz) as directed Prim Dressing: Hydrofera Blue Classic Foam Rope Dressing, 9x6 (mm/in) ary 1 x Per Day/30 Days Secondary  Dressing: ABD Pad 5x9 (in/in) 1 x Per Day/30 Days Discharge Instructions: Cover with ABD pad Secured With: Paper Tape,  0.5x10 (in/yd) 1 x Per Day/30 Days [name], [name] ([id number]) [phone].pdf Page 4 of 7 Electronic Signature(s) Signed: 02/17/2023 4:50:39 PM By: Midge Aver MSN RN CNS WTA Entered By: Midge Aver on 02/17/2023 16:50:39 -------------------------------------------------------------------------------- Problem List Details Patient Name: Date of Service: [name] ND, [name] 02/17/2023 2:15 PM Medical Record Number: [id number] Patient Account Number: [id number] Date of Birth/Sex: Treating RN: 14-Jul-1960 (62 y.o. [name] Primary Care Provider: Enid Baas Other Clinician: Referring Provider: Treating Provider/Extender: Fredrich Birks Weeks in Treatment: 41 Active Problems ICD-10 Encounter Code Description Active Date MDM Diagnosis L89.154 Pressure ulcer of sacral region, stage 4 05/06/2022 No Yes I26.99 Other pulmonary embolism without acute cor pulmonale 05/06/2022 No Yes 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, sacral and sacrococcygeal region 05/20/2022 No Yes G35 Multiple sclerosis 05/06/2022 No Yes I82.409 Acute embolism and thrombosis of unspecified deep veins of unspecified 05/06/2022 No Yes lower extremity Z79.01 Long term (current) use of anticoagulants 05/06/2022 No Yes Inactive Problems ICD-10 Code Description Active Date Inactive Date L89.153 Pressure ulcer of sacral region, stage 3 05/06/2022 05/06/2022 Resolved Problems ICD-10 Code Description Active Date Resolved Date L89.620 Pressure ulcer of left heel, unstageable 05/06/2022 05/06/2022 L89.510 Pressure ulcer of right ankle, unstageable 09/02/2022 09/02/2022 [name] ([id number]) [id number] Page 5 of 7 [phone] Pressure ulcer of left ankle, stage 3 09/02/2022 09/02/2022 Electronic Signature(s) Signed: 02/17/2023 2:20:05 PM By: Allen Derry PA-C Entered By: Allen Derry on 02/17/2023  14:20:05 -------------------------------------------------------------------------------- Progress Note Details Patient Name: Date of Service: [name] ND, [name] 02/17/2023 2:15 PM Medical Record Number: [id number] Patient Account Number: [id number] Date of Birth/Sex: Treating RN: 1960/05/17 (62 y.o. [name] Primary Care Provider: Enid Baas Other Clinician: Referring Provider: Treating Provider/Extender: Fredrich Birks Weeks in Treatment: 5 Subjective Chief Complaint Information obtained from Patient 05/06/2022; Bilateral feet wounds and sacral wounds History of Present Illness (HPI) 05/06/2022 [name] is a 62 year old female with a past medical history of multiple sclerosis and PEs and DVT that presents to the clinic for sacral s wounds and Bilateral feet wounds. Patient states that she obtained the sacral ulcers when she was hospitalized in November 2023 for bilateral pulmonary embolisms. She was in the ICU on a ventilator. Since discharge she has been using Dakin's wet-to-dry dressings to the area. Since her hospitalization she has been bedbound. However she is doing physical therapy. Prior to being hospitalized she was able to pull herself up and transfer from bed to chair on her own. Due to her MS she is not fully mobile. Unfortunately she states that the wound has gotten larger on her sacrum despite Wound care. Patient has home health. On 04/16/2022 she was admitted to the hospital for severe sepsis secondary to acute UTI and sacral cellulitis. She completed 5 days of IV cefepime and vancomycin. She was not discharged with oral antibiotics. She states she developed the bilateral feet wounds at that time. She states she has bunny boots however she is not wearing them. She currently denies systemic signs of infection. 2/7; patient presents for follow-up. She had a bone biopsy done at last clinic visit that showed fragments of  inflamed granulation tissue, necrotic material and bone with acute osteomyelitis. Culture grew Proteus mirabilis and Klebsiella pneumoniae. Patient is scheduled to see infectious disease next week. For now she has been doing Dakin's wet-to-dry dressings to the  sacrum. She is not able to obtain Santyl and has been keeping the left heel covered. T the right foot where o there was a previous wound with scab however this has healed. This has remained closed. No open wound noted. She states over the past week she has had chills but no fevers, nausea/vomiting or purulent drainage. 2/21; patient presents for follow-up. She saw Dr. Joylene Draft on 2/15. 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 Today she had feces impacted in the wound and throughout the tunneling. We discussed a diverting colostomy to address this issue. She was agreeable with a referral to general surgery. She currently denies systemic signs of infection. She has been using Medihoney and Hydrofera Blue to the left heel wound. She has been using her Prevalon boots. 3/20; patient presents for follow-up. She has been using Dakin's wet-to-dry dressings to the sacral wound. She has developed a new wound to her right lateral heel. She is using Medihoney and Hydrofera Blue to the left heel. She is currently taking Levaquin to complete 6 weeks. She follows with infectious disease for this. She saw general surgery at [facility] to discuss potentially doing a diverting colostomy however per patient's surgeon did not recommend this. 4/17; patient presents for follow-up. Has been using Dakin's wet-to-dry dressings to the sacral wound. She has been using Medihoney and Hydrofera Blue to the heel wounds. She states she is using her bunny boots for offloading the heels. She has not heard from plastic surgery for consultation. We gave her the number to call to follow this up. 5/22; patient presents for follow-up. She saw Dr. Ferd Hibbs on 5/8,  plastic surgery to discuss potentially doing surgical debridement with muscle flap. Per Dr. Timothy Lasso note Review of the surgery and requirements were discussed with the patient. Patient would like to hold off on proceeding with this option. Wound VAC was recommended and based on the wound today this option seemed reasonable to get started. Patient has home health that will be able to change the wound VAC. She has been using Dakin's wet-to-dry dressings to the sacrum. Patient has a new wound to the left medial ankle. She states she wears bunny boots but does not while in the wheelchair. 6/1; the patient has started the wound VAC as of Monday to the sacral area. Home health is out to change the dressing. She also has wounds on her bilateral ankles and left heel she is using Medihoney and Hydrofera Blue to these areas. She has advanced MS with contractures. Both the patient and her husband say she is eating a high-protein dilated well. She has some form of air mattress but the bed is from sleep number. I am uncertain about the offloading part of this 6/12; patient presents for follow-up. She has had a wound VAC to the sacral wound and home health has been changing this. She has had no issues with this. She has been using Hydrofera Blue and Medihoney to the bilateral ankle and left heel wounds. The right ankle wound is healed. [name], [name] ([id number]) [id number] Page 6 of 7 6/26; patient presents for follow-up. Has been using a wound VAC to the sacral wound. She has no issues with this. Wound is smaller. She had ABIs on the right that were 1.27 and on the left that was 1.35 with a TBI of 1.18. She had triphasic waveforms throughout. She is been using Hydrofera Blue and Medihoney to the left heel wounds. These have closed. She denies signs of infection. 7/10;  patient presents for follow-up. She continues to use a wound VAC to the sacral wound. She has slight skin  irritation to the periwound from the drape. Overall wound appears healing. She has no other open wounds to her lower extremities. She currently denies systemic signs of infection. 7/24; patient presents for follow-up. She continues to use the wound VAC to the sacral wound. There is no issues or complaints. 8/7; patient presents for follow-up. She has been using the wound VAC with benefit. The wound is smaller. She is has no issues or complaints today. Home health has been coming out and changing the wound VAC 3x A week. 9/11; patient presents for follow-up. Patient elected to stop the wound VAC 2 weeks ago as it has been a hassle to keep a seal. She has been using Dakin's wet-to-dry dressings. She has no issues or complaints. She denies signs of infection. 9/25; 2-week follow-up for this patient with advanced MS. 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 This is apparently come in quite a bit. They have been using Dakin's wet-to-dry for quite a period of time now. Her boyfriend changes the dressing. She is careful about offloading this, has a wheelchair cushion etc. 10/9; 2-week follow-up. Substantially improved sacral wound. She has been left with a small wound. I changed her to Prisma last week to see if we can stimulate some granulation to the small remaining area. It sounds as though she is up in a wheelchair but her friend is careful to manipulate this so she is not putting too much pressure on the lower coccyx area. 10/22; patient with a smaller ulcer on the lower sacrum/coccyx. We have been using silver collagen moistened with hydrogel change every 1 to 2 days. Last time she was here she had protuberant granulation which I knocked back with silver nitrate there is none of this today. 02-17-2023 upon evaluation today patient's wound actually showing signs of having a small pocket fortunately there is no evidence of infection she has made a lot of progress here but this is  still very difficult to get the close completely as far as the depth is concerned. Fortunately I do not see any signs of infection at this time. Objective Constitutional Well-nourished and well-hydrated in no acute distress. Vitals Time Taken: 2:31 PM, Temperature: 97.9 F, Pulse: 82 bpm, Respiratory Rate: 18 breaths/min, Blood Pressure: 107/72 mmHg. Respiratory normal breathing without difficulty. Psychiatric this patient is able to make decisions and demonstrates good insight into disease process. Alert and Oriented x 3. pleasant and cooperative. General Notes: Patient's wound bed actually showed signs of good granulation epithelization at this point. Fortunately I do not see any evidence of worsening overall and I do believe that the patient is making excellent headway towards closure. Integumentary (Hair, Skin) Wound #3 status is Open. Original cause of wound was Pressure Injury. The date acquired was: 03/09/2022. The wound has been in treatment 41 weeks. The wound is located on the Proximal,Midline Sacrum. The wound measures 0.7cm length x 0.2cm width x 1.5cm depth; 0.11cm^2 area and 0.165cm^3 volume. There is muscle and Fat Layer (Subcutaneous Tissue) exposed. There is a large amount of serosanguineous drainage noted. The wound margin is epibole. There is large (67-100%) red, hyper - granulation within the wound bed. There is a small (1-33%) amount of necrotic tissue within the wound bed. Assessment Active Problems ICD-10 Pressure ulcer of sacral region, stage 4 Other pulmonary embolism without acute cor pulmonale Osteomyelitis of vertebra, sacral and  sacrococcygeal region Multiple sclerosis Acute embolism and thrombosis of unspecified deep veins of unspecified lower extremity Long term (current) use of anticoagulants Plan 1. I would recommend based on what we are seeing that we have the patient going continue to monitor for any signs of infection or worsening. I do believe that the  [facility] Blue rope cut in half will do well here for her they can then moistened with Vashe. [name], [name] ([id number]) [id number] Page 7 of 7 2. I am good recommend as well that the patient should continue to monitor for any signs of infection or worsening. If anything changes she can contact the office and let me know. We will see patient back for reevaluation in 1 week here in the clinic. If anything worsens or changes patient will contact our office for additional recommendations. Electronic Signature(s) Signed: 02/17/2023 3:22:38 PM By: Allen Derry PA-C Entered By: Allen Derry on 02/17/2023 15:22:38 -------------------------------------------------------------------------------- SuperBill Details Patient Name: Date of Service: [name] ND, [name] 02/17/2023 Medical Record Number: [id number] Patient Account Number: [id number] Date of Birth/Sex: Treating RN: 07-Sep-1960 (62 y.o. 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, sacral and sacrococcygeal region G35 Multiple sclerosis I82.409 Acute embolism and thrombosis of unspecified deep veins of unspecified lower extremity Z79.01 Long term (current) use of anticoagulants Facility Procedures : CPT4 Code: [id number] Description: 99213 - WOUND CARE VISIT-LEV 3 EST PT Modifier: Quantity: 1 Physician Procedures : CPT4 Code Description Modifier [id number] - WC PHYS LEVEL 3 - EST PT ICD-10 Diagnosis Description L89.154 Pressure ulcer of sacral region, stage 4 I26.99 Other pulmonary embolism without acute cor pulmonale 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, sacral  and sacrococcygeal  region G35 Multiple sclerosis Quantity: 1 Electronic Signature(s) Signed: 02/17/2023 4:51:38 PM By: Midge Aver MSN RN CNS WTA Previous Signature: 02/17/2023 3:30:48 PM Version By: Allen Derry PA-C Entered By: Midge Aver on 02/17/2023 16:51:38

## 2023-02-26 ENCOUNTER — Other Ambulatory Visit: Payer: Medicare Other

## 2023-02-26 ENCOUNTER — Ambulatory Visit: Payer: Medicare Other | Admitting: Oncology

## 2023-03-03 ENCOUNTER — Encounter: Payer: Medicare Other | Admitting: Physician Assistant

## 2023-03-03 DIAGNOSIS — L89154 Pressure ulcer of sacral region, stage 4: Secondary | ICD-10-CM | POA: Diagnosis not present

## 2023-03-03 NOTE — Progress Notes (Signed)
RYDIA, NEAULT ([id number]) [id number] Page 1 of 8 Visit Report for 03/03/2023 Chief Complaint Document Details Patient Name: Date of Service: [name] Glen [name], [name] 03/03/2023 3:15 PM Medical Record Number: [id number] Patient Account Number: [id number] Date of Birth/Sex: Treating RN: [name] (62 y.o. [name] Primary Care Provider: Enid Baas Other Clinician: Referring Provider: Treating Provider/Extender: Fredrich Birks Weeks in Treatment: 16 Information Obtained from: Patient Chief Complaint 05/06/2022; Bilateral feet wounds and sacral wounds Electronic Signature(s) Signed: 03/03/2023 3:19:45 PM By: Allen Derry PA-C Entered By: Allen Derry on 03/03/2023 15:19:45 -------------------------------------------------------------------------------- HPI Details Patient Name: Date of Service: [name] Glen [name], [name] 03/03/2023 3:15 PM Medical Record Number: [id number] Patient Account Number: [id number] Date of Birth/Sex: Treating RN: [name] (62 y.o. [name] Primary Care Provider: Enid Baas Other Clinician: Referring Provider: Treating Provider/Extender: Fredrich Birks Weeks in Treatment: 22 History of Present Illness HPI Description: 05/06/2022 [name] is a 62 year old female with a past medical history of multiple sclerosis and PEs and DVT that presents to the clinic for sacral s wounds and Bilateral feet wounds. Patient states that she obtained the sacral ulcers when she was hospitalized in November 2023 for bilateral pulmonary embolisms. She was in the ICU on a ventilator. Since discharge she has been using Dakin's wet-to-dry dressings to the area. Since her hospitalization she has been bedbound. However she is doing physical therapy. Prior to being hospitalized she was able to pull herself up and transfer from bed to chair on her own. Due to her MS she is  not fully mobile. Unfortunately she states that the wound has gotten larger on her sacrum despite Wound care. Patient has home health. On 04/16/2022 she was admitted to the hospital for severe sepsis secondary to acute UTI and sacral cellulitis. She completed 5 days of IV cefepime and vancomycin. She was not discharged with oral antibiotics. She states she developed the bilateral feet wounds at that time. She states she has bunny boots however she is not wearing them. She currently denies systemic signs of infection. 2/7; patient presents for follow-up. She had a bone biopsy done at last clinic visit that showed fragments of inflamed granulation tissue, necrotic material and bone with acute osteomyelitis. Culture grew Proteus mirabilis and Klebsiella pneumoniae. Patient is scheduled to see infectious disease next week. For now she has been doing Dakin's wet-to-dry dressings to the sacrum. She is not able to obtain Santyl and has been keeping the left heel covered. T the right foot where o there was a previous wound with scab however this has healed. This has remained closed. No open wound noted. She states over the past week she has had chills but no fevers, nausea/vomiting or purulent drainage. 2/21; patient presents for follow-up. She saw Dr. Joylene Draft on 2/15. 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 Today she had feces [name], [name] ([id number]) [id number] Page 2 of 8 impacted in the wound and throughout the tunneling. We discussed a diverting colostomy to address this issue. She was agreeable with a referral to general surgery. She currently denies systemic signs of infection. She has been using Medihoney and Hydrofera Blue to the left heel wound. She has been using her Prevalon boots. 3/20; patient presents for follow-up. She has been using Dakin's wet-to-dry dressings to the sacral wound. She has developed a new wound to her right  lateral heel. She is using Medihoney and Hydrofera Blue to the left heel. She is  currently taking Levaquin to complete 6 weeks. She follows with infectious disease for this. She saw general surgery at [facility] to discuss potentially doing a diverting colostomy however per patient's surgeon did not recommend this. 4/17; patient presents for follow-up. Has been using Dakin's wet-to-dry dressings to the sacral wound. She has been using Medihoney and Hydrofera Blue to the heel wounds. She states she is using her bunny boots for offloading the heels. She has not heard from plastic surgery for consultation. We gave her the number to call to follow this up. 5/22; patient presents for follow-up. She saw Dr. Ferd Hibbs on 5/8, plastic surgery to discuss potentially doing surgical debridement with muscle flap. Per Dr. Timothy Lasso note Review of the surgery and requirements were discussed with the patient. Patient would like to hold off on proceeding with this option. Wound VAC was recommended and based on the wound today this option seemed reasonable to get started. Patient has home health that will be able to change the wound VAC. She has been using Dakin's wet-to-dry dressings to the sacrum. Patient has a new wound to the left medial ankle. She states she wears bunny boots but does not while in the wheelchair. 6/1; the patient has started the wound VAC as of Monday to the sacral area. Home health is out to change the dressing. She also has wounds on her bilateral ankles and left heel she is using Medihoney and Hydrofera Blue to these areas. She has advanced MS with contractures. Both the patient and her husband say she is eating a high-protein dilated well. She has some form of air mattress but the bed is from sleep number. I am uncertain about the offloading part of this 6/12; patient presents for follow-up. She has had a wound VAC to the sacral wound and home health has been changing this. She has had no issues with  this. She has been using Hydrofera Blue and Medihoney to the bilateral ankle and left heel wounds. The right ankle wound is healed. 6/26; patient presents for follow-up. Has been using a wound VAC to the sacral wound. She has no issues with this. Wound is smaller. She had ABIs on the right that were 1.27 and on the left that was 1.35 with a TBI of 1.18. She had triphasic waveforms throughout. She is been using Hydrofera Blue and Medihoney to the left heel wounds. These have closed. She denies signs of infection. 7/10; patient presents for follow-up. She continues to use a wound VAC to the sacral wound. She has slight skin irritation to the periwound from the drape. Overall wound appears healing. She has no other open wounds to her lower extremities. She currently denies systemic signs of infection. 7/24; patient presents for follow-up. She continues to use the wound VAC to the sacral wound. There is no issues or complaints. 8/7; patient presents for follow-up. She has been using the wound VAC with benefit. The wound is smaller. She is has no issues or complaints today. Home health has been coming out and changing the wound VAC 3x A week. 9/11; patient presents for follow-up. Patient elected to stop the wound VAC 2 weeks ago as it has been a hassle to keep a seal. She has been using Dakin's wet-to-dry dressings. She has no issues or complaints. She denies signs of infection. 9/25; 2-week follow-up for this patient with advanced MS. 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 This is apparently come in quite a bit. They have been using  Dakin's wet-to-dry for quite a period of time now. Her boyfriend changes the dressing. She is careful about offloading this, has a wheelchair cushion etc. 10/9; 2-week follow-up. Substantially improved sacral wound. She has been left with a small wound. I changed her to Prisma last week to see if we can stimulate some granulation to the small remaining  area. It sounds as though she is up in a wheelchair but her friend is careful to manipulate this so she is not putting too much pressure on the lower coccyx area. 10/22; patient with a smaller ulcer on the lower sacrum/coccyx. We have been using silver collagen moistened with hydrogel change every 1 to 2 days. Last time she was here she had protuberant granulation which I knocked back with silver nitrate there is none of this today. [name] upon evaluation today patient's wound actually showing signs of having a small pocket fortunately there is no evidence of infection she has made a lot of progress here but this is still very difficult to get the close completely as far as the depth is concerned. Fortunately I do not see any signs of infection at this time. 03-03-2023 upon evaluation today patient appears to be doing well currently in regard to her wound which is showing signs of improvement although it is very slow. Fortunately I do not see any signs of active infection at this time which is good news. No fevers, chills, nausea, vomiting, or diarrhea. Electronic Signature(s) Signed: 03/03/2023 4:06:06 PM By: Allen Derry PA-C Entered By: Allen Derry on 03/03/2023 16:06:06 -------------------------------------------------------------------------------- Physical Exam Details Patient Name: Date of Service: [name] Glen [name], [name] 03/03/2023 3:15 PM Medical Record Number: [id number] Patient Account Number: [id number] Date of Birth/Sex: Treating RN: [name] (62 y.o. [name] Primary Care Provider: Enid Baas Other Clinician: Referring Provider: Treating Provider/Extender: Fredrich Birks Weeks in Treatment: [address], [place] ([id number]) [id number] Page 3 of 8 Constitutional Well-nourished and well-hydrated in no acute distress. Respiratory normal breathing without difficulty. Psychiatric this patient is able to make  decisions and demonstrates good insight into disease process. Alert and Oriented x 3. pleasant and cooperative. Notes Upon inspection patient's wound bed showed signs of good granulation epithelization at this point. Fortunately I do not see any evidence of worsening I do believe that the patient is making good headway here towards closure which is great news and in general I do think that we are making progress towards getting it completely sealed although I think switching out the [facility] may be good at this point. Electronic Signature(s) Signed: 03/03/2023 4:06:26 PM By: Allen Derry PA-C Entered By: Allen Derry on 03/03/2023 16:06:26 -------------------------------------------------------------------------------- Physician Orders Details Patient Name: Date of Service: [name] Glen [name], [name] 03/03/2023 3:15 PM Medical Record Number: [id number] Patient Account Number: [id number] Date of Birth/Sex: Treating RN: [birth year], [name] (62 y.o. [name] Primary Care Provider: Enid Baas Other Clinician: Referring Provider: Treating Provider/Extender: Fredrich Birks Weeks in Treatment: 25 The following information was scribed by: Midge Aver The information was scribed for: Allen Derry Verbal / Phone Orders: No Diagnosis Coding ICD-10 Coding Code Description L89.154 Pressure ulcer of sacral region, stage 4 I26.99 Other pulmonary embolism without acute cor pulmonale 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, sacral and sacrococcygeal region G35 Multiple sclerosis I82.409 Acute embolism and thrombosis of unspecified deep veins of unspecified lower extremity Z79.01 Long term (current) use of anticoagulants Home Health Home Health Company: - [facility] CONTINUE Home Health for wound care. May  utilize formulary equivalent dressing for wound treatment orders unless otherwise specified. Home Health Nurse may visit PRN to address patients wound care needs. - Endoform cut  in a small oblong piece and packed into the wound with a skinny cotton tipped applicator Wound Treatment Wound #3 - Sacrum Wound Laterality: Midline, Proximal Cleanser: Vashe 5.8 (oz) Every Other Day/30 Days Discharge Instructions: Use vashe 5.8 (oz) as directed Prim Dressing: Endoform Natural, Non-fenestrated, 2x2 (in/in) ary Every Other Day/30 Days Secondary Dressing: ABD Pad 5x9 (in/in) Every Other Day/30 Days Discharge Instructions: Cover with ABD pad Secured With: Paper Tape, 0.5x10 (in/yd) Every Other Day/30 Days DYNASTY, MEES ([id number]) [id number] Page 4 of 8 Electronic Signature(s) Signed: 03/03/2023 4:23:07 PM By: Midge Aver MSN RN CNS WTA Signed: 03/04/2023 1:29:03 PM By: Allen Derry PA-C Entered By: Midge Aver on 03/03/2023 16:23:06 -------------------------------------------------------------------------------- Problem List Details Patient Name: Date of Service: [name] Glen [name], [name] 03/03/2023 3:15 PM Medical Record Number: [id number] Patient Account Number: [id number] Date of Birth/Sex: Treating RN: [name] (62 y.o. [name] Primary Care Provider: Enid Baas Other Clinician: Referring Provider: Treating Provider/Extender: Fredrich Birks Weeks in Treatment: 86 Active Problems ICD-10 Encounter Code Description Active Date MDM Diagnosis L89.154 Pressure ulcer of sacral region, stage 4 05/06/2022 No Yes I26.99 Other pulmonary embolism without acute cor pulmonale 05/06/2022 No Yes 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, sacral and sacrococcygeal region 05/20/2022 No Yes G35 Multiple sclerosis 05/06/2022 No Yes I82.409 Acute embolism and thrombosis of unspecified deep veins of unspecified 05/06/2022 No Yes lower extremity Z79.01 Long term (current) use of anticoagulants 05/06/2022 No Yes Inactive Problems ICD-10 Code Description Active Date Inactive Date L89.153 Pressure ulcer of sacral region,  stage 3 05/06/2022 05/06/2022 Resolved Problems ICD-10 Code Description Active Date Resolved Date L89.620 Pressure ulcer of left heel, unstageable 05/06/2022 05/06/2022 [name] ([id number]) [id number] Page 5 of 8 L89.510 Pressure ulcer of right ankle, unstageable 09/02/2022 09/02/2022 L89.523 Pressure ulcer of left ankle, stage 3 09/02/2022 09/02/2022 Electronic Signature(s) Signed: 03/03/2023 3:19:42 PM By: Allen Derry PA-C Entered By: Allen Derry on 03/03/2023 15:19:42 -------------------------------------------------------------------------------- Progress Note Details Patient Name: Date of Service: [name] Glen [name], [name] 03/03/2023 3:15 PM Medical Record Number: [id number] Patient Account Number: [id number] Date of Birth/Sex: Treating RN: [birth year]-[name] (62 y.o. [name] Primary Care Provider: Enid Baas Other Clinician: Referring Provider: Treating Provider/Extender: Fredrich Birks Weeks in Treatment: 43 Subjective Chief Complaint Information obtained from Patient 05/06/2022; Bilateral feet wounds and sacral wounds History of Present Illness (HPI) 05/06/2022 Ms. Teresia Piette is a 62 year old female with a past medical history of multiple sclerosis and PEs and DVT that presents to the clinic for sacral s wounds and Bilateral feet wounds. Patient states that she obtained the sacral ulcers when she was hospitalized in November 2023 for bilateral pulmonary embolisms. She was in the ICU on a ventilator. Since discharge she has been using Dakin's wet-to-dry dressings to the area. Since her hospitalization she has been bedbound. However she is doing physical therapy. Prior to being hospitalized she was able to pull herself up and transfer from bed to chair on her own. Due to her MS she is not fully mobile. Unfortunately she states that the wound has gotten larger on her sacrum despite Wound care. Patient has home  health. On 04/16/2022 she was admitted to the hospital for severe sepsis secondary to acute UTI and sacral cellulitis. She completed 5 days of IV cefepime and vancomycin. She was not discharged with oral  antibiotics. She states she developed the bilateral feet wounds at that time. She states she has bunny boots however she is not wearing them. She currently denies systemic signs of infection. 2/7; patient presents for follow-up. She had a bone biopsy done at last clinic visit that showed fragments of inflamed granulation tissue, necrotic material and bone with acute osteomyelitis. Culture grew Proteus mirabilis and Klebsiella pneumoniae. Patient is scheduled to see infectious disease next week. For now she has been doing Dakin's wet-to-dry dressings to the sacrum. She is not able to obtain Santyl and has been keeping the left heel covered. T the right foot where o there was a previous wound with scab however this has healed. This has remained closed. No open wound noted. She states over the past week she has had chills but no fevers, nausea/vomiting or purulent drainage. 2/21; patient presents for follow-up. She saw Dr. Joylene Draft on 2/15. 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 Today she had feces impacted in the wound and throughout the tunneling. We discussed a diverting colostomy to address this issue. She was agreeable with a referral to general surgery. She currently denies systemic signs of infection. She has been using Medihoney and Hydrofera Blue to the left heel wound. She has been using her Prevalon boots. 3/20; patient presents for follow-up. She has been using Dakin's wet-to-dry dressings to the sacral wound. She has developed a new wound to her right lateral heel. She is using Medihoney and Hydrofera Blue to the left heel. She is currently taking Levaquin to complete 6 weeks. She follows with infectious disease for this. She saw general surgery at [facility] to discuss  potentially doing a diverting colostomy however per patient's surgeon did not recommend this. 4/17; patient presents for follow-up. Has been using Dakin's wet-to-dry dressings to the sacral wound. She has been using Medihoney and Hydrofera Blue to the heel wounds. She states she is using her bunny boots for offloading the heels. She has not heard from plastic surgery for consultation. We gave her the number to call to follow this up. 5/22; patient presents for follow-up. She saw Dr. Ferd Hibbs on 5/8, plastic surgery to discuss potentially doing surgical debridement with muscle flap. Per Dr. Timothy Lasso note Review of the surgery and requirements were discussed with the patient. Patient would like to hold off on proceeding with this option. Wound VAC was recommended and based on the wound today this option seemed reasonable to get started. Patient has home health that will be able to change the wound VAC. She has been using Dakin's wet-to-dry dressings to the sacrum. Patient has a new wound to the left medial ankle. She states she wears bunny boots but does not while in the wheelchair. 6/1; the patient has started the wound VAC as of Monday to the sacral area. Home health is out to change the dressing. She also has wounds on her bilateral ankles and left heel she is using Medihoney and Hydrofera Blue to these areas. She has advanced MS with contractures. Both the patient and her husband say she is eating a high-protein dilated well. She has some form of air mattress but [name], [name] ([id number]) [id number] Page 6 of 8 the bed is from sleep number. I am uncertain about the offloading part of this 6/12; patient presents for follow-up. She has had a wound VAC to the sacral wound and home health has been changing this. She has had no issues with this. She has been using Hydrofera Blue and  Medihoney to the bilateral ankle and left heel wounds. The right ankle wound is  healed. 6/26; patient presents for follow-up. Has been using a wound VAC to the sacral wound. She has no issues with this. Wound is smaller. She had ABIs on the right that were 1.27 and on the left that was 1.35 with a TBI of 1.18. She had triphasic waveforms throughout. She is been using Hydrofera Blue and Medihoney to the left heel wounds. These have closed. She denies signs of infection. 7/10; patient presents for follow-up. She continues to use a wound VAC to the sacral wound. She has slight skin irritation to the periwound from the drape. Overall wound appears healing. She has no other open wounds to her lower extremities. She currently denies systemic signs of infection. 7/24; patient presents for follow-up. She continues to use the wound VAC to the sacral wound. There is no issues or complaints. 8/7; patient presents for follow-up. She has been using the wound VAC with benefit. The wound is smaller. She is has no issues or complaints today. Home health has been coming out and changing the wound VAC 3x A week. 9/11; patient presents for follow-up. Patient elected to stop the wound VAC 2 weeks ago as it has been a hassle to keep a seal. She has been using Dakin's wet-to-dry dressings. She has no issues or complaints. She denies signs of infection. 9/25; 2-week follow-up for this patient with advanced MS. 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 This is apparently come in quite a bit. They have been using Dakin's wet-to-dry for quite a period of time now. Her boyfriend changes the dressing. She is careful about offloading this, has a wheelchair cushion etc. 10/9; 2-week follow-up. Substantially improved sacral wound. She has been left with a small wound. I changed her to Prisma last week to see if we can stimulate some granulation to the small remaining area. It sounds as though she is up in a wheelchair but her friend is careful to manipulate this so she is not putting too  much pressure on the lower coccyx area. 10/22; patient with a smaller ulcer on the lower sacrum/coccyx. We have been using silver collagen moistened with hydrogel change every 1 to 2 days. Last time she was here she had protuberant granulation which I knocked back with silver nitrate there is none of this today. [name] upon evaluation today patient's wound actually showing signs of having a small pocket fortunately there is no evidence of infection she has made a lot of progress here but this is still very difficult to get the close completely as far as the depth is concerned. Fortunately I do not see any signs of infection at this time. 03-03-2023 upon evaluation today patient appears to be doing well currently in regard to her wound which is showing signs of improvement although it is very slow. Fortunately I do not see any signs of active infection at this time which is good news. No fevers, chills, nausea, vomiting, or diarrhea. Objective Constitutional Well-nourished and well-hydrated in no acute distress. Vitals Time Taken: 3:33 PM, Temperature: 98.0 F, Pulse: 79 bpm, Respiratory Rate: 18 breaths/min, Blood Pressure: 116/56 mmHg. Respiratory normal breathing without difficulty. Psychiatric this patient is able to make decisions and demonstrates good insight into disease process. Alert and Oriented x 3. pleasant and cooperative. General Notes: Upon inspection patient's wound bed showed signs of good granulation epithelization at this point. Fortunately I do not see any evidence  of worsening I do believe that the patient is making good headway here towards closure which is great news and in general I do think that we are making progress towards getting it completely sealed although I think switching out the [facility] may be good at this point. Integumentary (Hair, Skin) Wound #3 status is Open. Original cause of wound was Pressure Injury. The date acquired was: 03/09/2022. The wound  has been in treatment 43 weeks. The wound is located on the Proximal,Midline Sacrum. The wound measures 0.5cm length x 0.1cm width x 1cm depth; 0.039cm^2 area and 0.039cm^3 volume. There is muscle and Fat Layer (Subcutaneous Tissue) exposed. There is a large amount of serosanguineous drainage noted. The wound margin is epibole. There is large (67-100%) red, hyper - granulation within the wound bed. There is a small (1-33%) amount of necrotic tissue within the wound bed. Assessment Active Problems ICD-10 Pressure ulcer of sacral region, stage 4 Other pulmonary embolism without acute cor pulmonale Osteomyelitis of vertebra, sacral and sacrococcygeal region Multiple sclerosis Acute embolism and thrombosis of unspecified deep veins of unspecified lower extremity Long term (current) use of anticoagulants KAZLYN, KARAU ([id number]) [id number] Page 7 of 8 Plan 1. I would recommend currently based on what I am seeing that we going switch from [facility] to doing a endoform type dressing I think this is probably be the best way to go. 2. I would recommend as well that the patient should continue with the ABD pad and tape to secure in place. 3. I am going to recommend continuing appropriate offloading she seems to be doing well in that regard I do not think the Hydrofera Blue rope was actually staying in place anyway I think this is doing much better. We will see patient back for reevaluation in 1 week here in the clinic. If anything worsens or changes patient will contact our office for additional recommendations. Electronic Signature(s) Signed: 03/03/2023 4:07:06 PM By: Allen Derry PA-C Entered By: Allen Derry on 03/03/2023 16:07:06 -------------------------------------------------------------------------------- SuperBill Details Patient Name: Date of Service: [name] Glen [name], [name] 03/03/2023 Medical Record Number: [id number] Patient Account Number:  [id number] Date of Birth/Sex: Treating RN: [name], [name] (62 y.o. 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, sacral and sacrococcygeal region G35 Multiple sclerosis I82.409 Acute embolism and thrombosis of unspecified deep veins of unspecified lower extremity Z79.01 Long term (current) use of anticoagulants Facility Procedures : CPT4 Code: 08657846 Description: 99213 - WOUND CARE VISIT-LEV 3 EST PT Modifier: Quantity: 1 Physician Procedures : CPT4 Code Description Modifier [id number] 99213 - WC PHYS LEVEL 3 - EST PT ICD-10 Diagnosis Description L89.154 Pressure ulcer of sacral region, stage 4 I26.99 Other pulmonary embolism without acute cor pulmonale 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, sacral  and sacrococcygeal region G35 Multiple sclerosis Quantity: 1 Electronic Signature(s) Signed: 03/03/2023 4:29:40 PM By: Midge Aver MSN RN CNS WTA Signed: 03/04/2023 1:29:03 PM By: Allen Derry PA-C Previous Signature: 03/03/2023 4:07:21 PM Version By: Allen Derry PA-C Entered By: Midge Aver on 03/03/2023 16:29:40 [name] ([id number]) [id number] Page 8 of 8

## 2023-03-03 NOTE — Progress Notes (Addendum)
BERTINE, TERSIGNI ([id number]) [id number] Page 1 of 9 Visit Report for 03/03/2023 Arrival Information Details Patient Name: Date of Service: [name] ND, [name] 03/03/2023 3:15 PM Medical Record Number: [id number] Patient Account Number: [id number] Date of Birth/Sex: Treating RN: 1960/10/26 (62 y.o. [name] Primary Care Jennessy Sandridge: Enid Baas Other Clinician: Referring Jil Penland: Treating Andromeda Poppen/Extender: Fredrich Birks Weeks in Treatment: 51 Visit Information History Since Last Visit Added or deleted any medications: No Patient Arrived: Wheel Chair Any new allergies or adverse reactions: No Arrival Time: 15:29 Has Dressing in Place as Prescribed: Yes Accompanied By: boyfriend Pain Present Now: No Transfer Assistance: None Patient Identification Verified: Yes Secondary Verification Process Completed: Yes Patient Requires Transmission-Based Precautions: No Patient Has Alerts: Yes Patient Alerts: Patient on Blood Thinner Not Diabetic Xarelto Electronic Signature(s) Signed: 03/05/2023 12:46:12 PM By: Midge Aver MSN RN CNS WTA Entered By: Midge Aver on 03/03/2023 15:29:47 -------------------------------------------------------------------------------- Clinic Level of Care Assessment Details Patient Name: Date of Service: [name] ND, [name] 03/03/2023 3:15 PM Medical Record Number: [id number] Patient Account Number: [id number] Date of Birth/Sex: Treating RN: [birth year] (62 y.o. [name] Primary Care Anyae Griffith: Enid Baas Other Clinician: Referring Michalene Debruler: Treating Caryn Gienger/Extender: Fredrich Birks Weeks in Treatment: 60 Clinic Level of Care Assessment Items TOOL 4 Quantity Score X- 1 0 Use when only an EandM is performed on FOLLOW-UP visit ASSESSMENTS - Nursing Assessment / Reassessment X- 1 10 Reassessment of Co-morbidities (includes updates in patient  status) X- 1 5 Reassessment of Adherence to Treatment Plan ASSESSMENTS - Wound and Skin A ssessment / Reassessment X - Simple Wound Assessment / Reassessment - one wound 1 5 Tallman, Demiana ([id number]) [id number] Page 2 of 9 []  - 0 Complex Wound Assessment / Reassessment - multiple wounds []  - 0 Dermatologic / Skin Assessment (not related to wound area) ASSESSMENTS - Focused Assessment []  - 0 Circumferential Edema Measurements - multi extremities []  - 0 Nutritional Assessment / Counseling / Intervention []  - 0 Lower Extremity Assessment (monofilament, tuning fork, pulses) []  - 0 Peripheral Arterial Disease Assessment (using hand held doppler) ASSESSMENTS - Ostomy and/or Continence Assessment and Care []  - 0 Incontinence Assessment and Management []  - 0 Ostomy Care Assessment and Management (repouching, etc.) PROCESS - Coordination of Care X - Simple Patient / Family Education for ongoing care 1 15 []  - 0 Complex (extensive) Patient / Family Education for ongoing care X- 1 10 Staff obtains Chiropractor, Records, T Results / Process Orders est []  - 0 Staff telephones HHA, Nursing Homes / Clarify orders / etc []  - 0 Routine Transfer to another Facility (non-emergent condition) []  - 0 Routine Hospital Admission (non-emergent condition) []  - 0 New Admissions / Manufacturing engineer / Ordering NPWT Apligraf, etc. , []  - 0 Emergency Hospital Admission (emergent condition) X- 1 10 Simple Discharge Coordination []  - 0 Complex (extensive) Discharge Coordination PROCESS - Special Needs []  - 0 Pediatric / Minor Patient Management []  - 0 Isolation Patient Management []  - 0 Hearing / Language / Visual special needs []  - 0 Assessment of Community assistance (transportation, D/C planning, etc.) []  - 0 Additional assistance / Altered mentation []  - 0 Support Surface(s) Assessment (bed, cushion, seat, etc.) INTERVENTIONS - Wound Cleansing /  Measurement X - Simple Wound Cleansing - one wound 1 5 []  - 0 Complex Wound Cleansing - multiple wounds X- 1 5 Wound Imaging (photographs - any number of wounds) []  - 0 Wound Tracing (instead of photographs) X- 1  5 Simple Wound Measurement - one wound []  - 0 Complex Wound Measurement - multiple wounds INTERVENTIONS - Wound Dressings X - Small Wound Dressing one or multiple wounds 1 10 []  - 0 Medium Wound Dressing one or multiple wounds []  - 0 Large Wound Dressing one or multiple wounds []  - 0 Application of Medications - topical []  - 0 Application of Medications - injection INTERVENTIONS - Miscellaneous []  - 0 External ear exam []  - 0 Specimen Collection (cultures, biopsies, blood, body fluids, etc.) GEARLDINE, BRUMMOND ([id number]) [id number] Page 3 of 9 []  - 0 Specimen(s) / Culture(s) sent or taken to Lab for analysis []  - 0 Patient Transfer (multiple staff / Michiel Sites Lift / Similar devices) []  - 0 Simple Staple / Suture removal (25 or less) []  - 0 Complex Staple / Suture removal (26 or more) []  - 0 Hypo / Hyperglycemic Management (close monitor of Blood Glucose) []  - 0 Ankle / Brachial Index (ABI) - do not check if billed separately X- 1 5 Vital Signs Has the patient been seen at the hospital within the last three years: Yes Total Score: 85 Level Of Care: New/Established - Level 3 Electronic Signature(s) Signed: 03/05/2023 12:46:12 PM By: Midge Aver MSN RN CNS WTA Entered By: Midge Aver on 03/03/2023 16:29:26 -------------------------------------------------------------------------------- Encounter Discharge Information Details Patient Name: Date of Service: [name] ND, [name] 03/03/2023 3:15 PM Medical Record Number: [id number] Patient Account Number: [id number] Date of Birth/Sex: Treating RN: 1960/12/16 (62 y.o. [name] Primary Care Chaniya Genter: Enid Baas Other Clinician: Referring Tobey Lippard: Treating  Cherokee Clowers/Extender: Fredrich Birks Weeks in Treatment: 73 Encounter Discharge Information Items Discharge Condition: Stable Ambulatory Status: Wheelchair Discharge Destination: Home Transportation: Private Auto Accompanied By: boyfriend Schedule Follow-up Appointment: Yes Clinical Summary of Care: Electronic Signature(s) Signed: 03/03/2023 4:30:45 PM By: Midge Aver MSN RN CNS WTA Entered By: Midge Aver on 03/03/2023 16:30:45 -------------------------------------------------------------------------------- Lower Extremity Assessment Details Patient Name: Date of Service: [name] ND, [name] 03/03/2023 3:15 PM Medical Record Number: [id number] Patient Account Number: [id number] Date of Birth/Sex: Treating RN: [birth year] (62 y.o. Fostine, Bresnahan, Analya ([id number]) [phone].pdf Page 4 of 9 Primary Care Mical Kicklighter: Enid Baas Other Clinician: Referring Angle Karel: Treating Esker Dever/Extender: Fredrich Birks Weeks in Treatment: 75 Electronic Signature(s) Signed: 03/05/2023 12:46:12 PM By: Midge Aver MSN RN CNS WTA Entered By: Midge Aver on 03/03/2023 15:49:35 -------------------------------------------------------------------------------- Multi Wound Chart Details Patient Name: Date of Service: [name] ND, [name] 03/03/2023 3:15 PM Medical Record Number: [id number] Patient Account Number: [id number] Date of Birth/Sex: Treating RN: 11-Dec-1960 (62 y.o. [name] Primary Care Lavonne Cass: Enid Baas Other Clinician: Referring Lalani Winkles: Treating Arwin Bisceglia/Extender: Fredrich Birks Weeks in Treatment: 36 Vital Signs Height(in): Pulse(bpm): 79 Weight(lbs): Blood Pressure(mmHg): 116/56 Body Mass Index(BMI): Temperature(F): 98.0 Respiratory Rate(breaths/min): 18 [3:Photos:] [N/A:N/A] Proximal, Midline Sacrum N/A N/A Wound Location: Pressure Injury N/A  N/A Wounding Event: Pressure Ulcer N/A N/A Primary Etiology: Angina, Hypotension N/A N/A Comorbid History: 03/09/2022 N/A N/A Date Acquired: 63 N/A N/A Weeks of Treatment: Open N/A N/A Wound Status: No N/A N/A Wound Recurrence: 0.5x0.1x1 N/A N/A Measurements L x W x D (cm) 0.039 N/A N/A A (cm) : rea 0.039 N/A N/A Volume (cm) : 99.80% N/A N/A % Reduction in A rea: 100.00% N/A N/A % 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 N/A N/A Classification: Large N/A N/A Exudate A mount: Serosanguineous N/A N/A Exudate Type: red, brown N/A N/A Exudate Color: Epibole N/A N/A Wound Margin:  Large (67-100%) N/A N/A Granulation A mount: Red, Hyper-granulation N/A N/A Granulation Quality: Small (1-33%) N/A N/A Necrotic A mount: Fat Layer (Subcutaneous Tissue): Yes N/A N/A Exposed Structures: Muscle: Yes Fascia: No Tendon: No Joint: No Bone: No None N/A N/A EpithelializationARAVIS, SERGEANT ([id number]) [id number] Page 5 of 9 Treatment Notes Electronic Signature(s) Signed: 03/05/2023 12:46:12 PM By: Midge Aver MSN RN CNS WTA Entered By: Midge Aver on 03/03/2023 15:50:25 -------------------------------------------------------------------------------- Multi-Disciplinary Care Plan Details Patient Name: Date of Service: [name] ND, [name] 03/03/2023 3:15 PM Medical Record Number: [id number] Patient Account Number: [id number] Date of Birth/Sex: Treating RN: 1960/12/07 (62 y.o. [name] Primary Care Sameera Betton: Enid Baas Other Clinician: Referring Carroll Ranney: Treating Heydi Swango/Extender: Fredrich Birks Weeks in Treatment: 85 Active Inactive Wound/Skin Impairment Nursing Diagnoses: Impaired tissue integrity Knowledge deficit related to ulceration/compromised skin integrity Goals: Patient/caregiver will verbalize understanding of skin care regimen Date Initiated: 05/06/2022 Date Inactivated:  07/29/2022 Target Resolution Date: 08/28/2022 Goal Status: Met Ulcer/skin breakdown will have a volume reduction of 30% by week 4 Date Initiated: 05/06/2022 Date Inactivated: 07/29/2022 Target Resolution Date: 06/06/2022 Goal Status: Unmet Unmet Reason: not 30% decrease Ulcer/skin breakdown will have a volume reduction of 50% by week 8 Date Initiated: 05/20/2022 Date Inactivated: 07/29/2022 Target Resolution Date: 07/05/2022 Unmet Reason: not 50% decrease in Goal Status: Unmet size Ulcer/skin breakdown will have a volume reduction of 80% by week 12 Date Initiated: 05/20/2022 Date Inactivated: 07/29/2022 Target Resolution Date: 08/05/2022 Unmet Reason: not 80% decrease in Goal Status: Unmet size Ulcer/skin breakdown will heal within 14 weeks Date Initiated: 05/20/2022 Target Resolution Date: 04/10/2023 Goal Status: Active Interventions: Assess patient/caregiver ability to perform ulcer/skin care regimen upon admission and as needed Assess ulceration(s) every visit Provide education on ulcer and skin care Screen for HBO Treatment Activities: Skin care regimen initiated : 05/06/2022 Topical wound management initiated : 05/06/2022 Notes: Electronic Signature(s) Signed: 03/03/2023 4:29:53 PM By: Midge Aver MSN RN CNS WTA Entered By: Midge Aver on 03/03/2023 16:29:53 [name] ([id number]) [id number] Page 6 of 9 -------------------------------------------------------------------------------- Pain Assessment Details Patient Name: Date of Service: [name] ND, [name] 03/03/2023 3:15 PM Medical Record Number: [id number] Patient Account Number: [id number] Date of Birth/Sex: Treating RN: Mar 05, 1961 (63 y.o. [name] Primary Care Temisha Murley: Enid Baas Other Clinician: Referring Lilyana Lippman: Treating Avie Checo/Extender: Fredrich Birks Weeks in Treatment: 54 Active Problems Location of Pain Severity and Description of  Pain Patient Has Paino No Site Locations Pain Management and Medication Current Pain Management: Electronic Signature(s) Signed: 03/05/2023 12:46:12 PM By: Midge Aver MSN RN CNS WTA Entered By: Midge Aver on 03/03/2023 15:47:51 -------------------------------------------------------------------------------- Patient/Caregiver Education Details Patient Name: Date of Service: [name] ND, [name] 11/20/2024andnbsp3:15 PM Medical Record Number: [id number] Patient Account Number: [id number] Date of Birth/Gender: Treating RN: March 30, 1961 (62 y.o. [name] Primary Care Physician: Enid Baas Other Clinician: Referring Physician: Treating Physician/Extender: Fredrich Birks [place], [place] ([id number]) [phone].pdf Page 7 of 9 Weeks in Treatment: 82 Education Assessment Education Provided To: Patient Education Topics Provided Wound/Skin Impairment: Handouts: Caring for Your Ulcer Methods: Explain/Verbal Responses: State content correctly Electronic Signature(s) Signed: 03/05/2023 12:46:12 PM By: Midge Aver MSN RN CNS WTA Entered By: Midge Aver on 03/03/2023 16:30:04 -------------------------------------------------------------------------------- Wound Assessment Details Patient Name: Date of Service: [name] ND, [name] 03/03/2023 3:15 PM Medical Record Number: [id number] Patient Account Number: [id number] Date of Birth/Sex: Treating RN: 23-Oct-1960 (62 y.o. [name] Primary Care Darneshia Demary:  Nemiah Commander [id number] Other Clinician: Referring Kamorah Nevils: Treating Taelyr Jantz/Extender: Fredrich Birks Weeks in Treatment: 75 Wound Status Wound Number: 3 Primary Etiology: Pressure Ulcer Wound Location: Proximal, Midline Sacrum Wound Status: Open Wounding Event: Pressure Injury Comorbid History: Angina, Hypotension Date Acquired: 03/09/2022 Weeks Of Treatment: 43 Clustered Wound:  No Photos Wound Measurements Length: (cm) 0.5 Width: (cm) 0.1 Depth: (cm) 1 Area: (cm) 0.039 Volume: (cm) 0.039 % Reduction in Area: 99.8% % Reduction in Volume: 100% Epithelialization: None Wound Description Classification: Category/Stage IV Brisby, Tashayla ([id number]) Wound Margin: Epibole Exudate Amount: Large Exudate Type: Serosanguineous Exudate Color: red, brown Foul Odor After Cleansing: No [id number] Page 8 of 9 Slough/Fibrino Yes Wound Bed Granulation Amount: Large (67-100%) Exposed Structure Granulation Quality: Red, Hyper-granulation Fascia Exposed: No Necrotic Amount: Small (1-33%) Fat Layer (Subcutaneous Tissue) Exposed: Yes Tendon Exposed: No Muscle Exposed: Yes Necrosis of Muscle: No Joint Exposed: No Bone Exposed: No Treatment Notes Wound #3 (Sacrum) Wound Laterality: Midline, Proximal Cleanser Vashe 5.8 (oz) Discharge Instruction: Use vashe 5.8 (oz) as directed Peri-Wound Care Topical Primary Dressing Endoform Natural, Non-fenestrated, 2x2 (in/in) Secondary Dressing ABD Pad 5x9 (in/in) Discharge Instruction: Cover with ABD pad Secured With Paper Tape, 0.5x10 (in/yd) Compression Wrap Compression Stockings Add-Ons Electronic Signature(s) Signed: 03/05/2023 12:46:12 PM By: Midge Aver MSN RN CNS WTA Entered By: Midge Aver on 03/03/2023 15:49:22 -------------------------------------------------------------------------------- Vitals Details Patient Name: Date of Service: [name] ND, [name] 03/03/2023 3:15 PM Medical Record Number: [id number] Patient Account Number: [id number] Date of Birth/Sex: Treating RN: [birth year] (62 y.o. [name] Primary Care Riddhi Grether: Enid Baas Other Clinician: Referring Nassim Cosma: Treating Marolyn Urschel/Extender: Fredrich Birks Weeks in Treatment: 75 Vital Signs Time Taken: 15:33 Temperature (F): 98.0 Pulse (bpm): 79 Respiratory Rate  (breaths/min): 18 Blood Pressure (mmHg): 116/56 Reference Range: 80 - 120 mg / dl LESLEIGH, PRESTIGIACOMO ([id number]) [id number] Page 9 of 9 Electronic Signature(s) Signed: 03/05/2023 12:46:12 PM By: Midge Aver MSN RN CNS WTA Entered By: Midge Aver on 03/03/2023 15:47:45

## 2023-03-05 ENCOUNTER — Inpatient Hospital Stay: Payer: Medicare Other | Attending: Oncology | Admitting: Oncology

## 2023-03-05 ENCOUNTER — Encounter: Payer: Self-pay | Admitting: Oncology

## 2023-03-05 ENCOUNTER — Inpatient Hospital Stay: Payer: Medicare Other

## 2023-03-05 ENCOUNTER — Other Ambulatory Visit: Payer: Medicare Other

## 2023-03-05 VITALS — BP 104/59 | HR 84 | Temp 96.1°F | Resp 18

## 2023-03-05 DIAGNOSIS — Z885 Allergy status to narcotic agent status: Secondary | ICD-10-CM | POA: Insufficient documentation

## 2023-03-05 DIAGNOSIS — Z79899 Other long term (current) drug therapy: Secondary | ICD-10-CM | POA: Diagnosis not present

## 2023-03-05 DIAGNOSIS — Z7901 Long term (current) use of anticoagulants: Secondary | ICD-10-CM | POA: Insufficient documentation

## 2023-03-05 DIAGNOSIS — K219 Gastro-esophageal reflux disease without esophagitis: Secondary | ICD-10-CM | POA: Insufficient documentation

## 2023-03-05 DIAGNOSIS — D72821 Monocytosis (symptomatic): Secondary | ICD-10-CM | POA: Diagnosis not present

## 2023-03-05 DIAGNOSIS — Z881 Allergy status to other antibiotic agents status: Secondary | ICD-10-CM | POA: Diagnosis not present

## 2023-03-05 DIAGNOSIS — Z86711 Personal history of pulmonary embolism: Secondary | ICD-10-CM | POA: Diagnosis not present

## 2023-03-05 DIAGNOSIS — R5382 Chronic fatigue, unspecified: Secondary | ICD-10-CM | POA: Insufficient documentation

## 2023-03-05 DIAGNOSIS — Z800 Family history of malignant neoplasm of digestive organs: Secondary | ICD-10-CM | POA: Insufficient documentation

## 2023-03-05 DIAGNOSIS — I2699 Other pulmonary embolism without acute cor pulmonale: Secondary | ICD-10-CM | POA: Insufficient documentation

## 2023-03-05 DIAGNOSIS — Z888 Allergy status to other drugs, medicaments and biological substances status: Secondary | ICD-10-CM | POA: Insufficient documentation

## 2023-03-05 DIAGNOSIS — G35 Multiple sclerosis: Secondary | ICD-10-CM | POA: Insufficient documentation

## 2023-03-05 LAB — CBC WITH DIFFERENTIAL (CANCER CENTER ONLY)
Abs Immature Granulocytes: 0.54 10*3/uL — ABNORMAL HIGH (ref 0.00–0.07)
Basophils Absolute: 0.1 10*3/uL (ref 0.0–0.1)
Basophils Relative: 1 %
Eosinophils Absolute: 0 10*3/uL (ref 0.0–0.5)
Eosinophils Relative: 0 %
HCT: 45.1 % (ref 36.0–46.0)
Hemoglobin: 14.1 g/dL (ref 12.0–15.0)
Immature Granulocytes: 7 %
Lymphocytes Relative: 17 %
Lymphs Abs: 1.2 10*3/uL (ref 0.7–4.0)
MCH: 27.6 pg (ref 26.0–34.0)
MCHC: 31.3 g/dL (ref 30.0–36.0)
MCV: 88.3 fL (ref 80.0–100.0)
Monocytes Absolute: 1.2 10*3/uL — ABNORMAL HIGH (ref 0.1–1.0)
Monocytes Relative: 16 %
Neutro Abs: 4.4 10*3/uL (ref 1.7–7.7)
Neutrophils Relative %: 59 %
Platelet Count: 197 10*3/uL (ref 150–400)
RBC: 5.11 MIL/uL (ref 3.87–5.11)
RDW: 16.5 % — ABNORMAL HIGH (ref 11.5–15.5)
Smear Review: ADEQUATE
WBC Count: 7.4 10*3/uL (ref 4.0–10.5)
nRBC: 0 % (ref 0.0–0.2)

## 2023-03-05 NOTE — Progress Notes (Signed)
Hematology/Oncology Progress note [phone] Fax:(336) [phone]       CHIEF COMPLAINTS/PURPOSE OF CONSULTATION:  Leukocytosis  ASSESSMENT & PLAN:   Leukocytosis Previous work up  JAK2 V617F mutation negative, with reflex to other mutations CALR, MPL, JAK 2 Ex 12-15 mutations negative. EBV negative.  CMV negative, BCR-ABL FISH negative.  Peripheral blood flow cytometry showed monocytosis 17% of the leukocytes, with immunophenotypic aberrancies-CD56 in a subset and down-regulation of CD13.  A nonspecific finding that can be seen in association with reactive/active processes as well as neoplastic processes.  No immature B cells detected in the specimen.  Bone marrow biopsy findings are not diagnostic for neoplastic process.  Normal cytogenetics.  NGS showed TET2 mutation Today's cbc showed no leukocytosis.  I recommend to continue to monitor her blood count.    Bilateral pulmonary embolism (HCC) Continue Xarelto, managed by PCP.  Consider decrease to 10mg  daily for prophylaxis.   Cc Dr. Nemiah Commander Orders Placed This Encounter  Procedures   CBC with Differential (Cancer Center Only)    Standing Status:   Future    Number of Occurrences:   1    Standing Expiration Date:   03/04/2024   CBC with Differential (Cancer Center Only)    Standing Status:   Future    Standing Expiration Date:   03/04/2024   Follow up 1 year All questions were answered. The patient knows to call the clinic with any problems, questions or concerns.  Rickard Patience, MD, PhD [facility] Hematology Oncology 03/05/2023    HISTORY OF PRESENTING ILLNESS:  [name] 62 y.o. female presents to for post hospitalization follow up for leukocytosis She is accompanied by her boyfriend.  +Stage IV  Decubitus ulcer,acute osteomyelitis patient follow-up with wound care. She is on Trimethoprim 100mg  daily for prophylaxis.  Chronic fatigue unchanged. She is Xarelto 20mg  daily for PE.       MEDICAL HISTORY:  Past Medical History:  Diagnosis Date   Bilateral pulmonary embolism (HCC)    Chronic constipation    Family history of pancreatic cancer    Frequent falls    GERD (gastroesophageal reflux disease)    Hiatal hernia    Multiple sclerosis (HCC)    Pulmonary hemorrhage    RLS (restless legs syndrome)    Sepsis due to pneumonia (HCC)    Tachycardia     SURGICAL HISTORY: Past Surgical History:  Procedure Laterality Date   ESOPHAGOGASTRODUODENOSCOPY (EGD) WITH PROPOFOL N/A 09/11/2021   Procedure: ESOPHAGOGASTRODUODENOSCOPY (EGD) WITH PROPOFOL;  Surgeon: Jaynie Collins, DO;  Location: [facility] ENDOSCOPY;  Service: Gastroenterology;  Laterality: N/A;   PULMONARY THROMBECTOMY Bilateral 02/25/2022   Procedure: PULMONARY THROMBECTOMY;  Surgeon: Annice Needy, MD;  Location: [facility] INVASIVE CV LAB;  Service: Cardiovascular;  Laterality: Bilateral;   TUBAL LIGATION      SOCIAL HISTORY: Social History   Socioeconomic History   Marital status: Widowed    Spouse name: Not on file   Number of children: Not on file   Years of education: Not on file   Highest education level: Not on file  Occupational History   Not on file  Tobacco Use   Smoking status: Never   Smokeless tobacco: Never  Vaping Use   Vaping status: Never Used  Substance and Sexual Activity   Alcohol use: Never   Drug use: Never   Sexual activity: Not Currently  Other Topics Concern   Not on file  Social History Narrative   Not on file   Social Determinants  of Health   Financial Resource Strain: Low Risk  (01/19/2023)   Received from [facility]   Overall Financial Resource Strain (CARDIA)    Difficulty of Paying Living Expenses: Not hard at all  Food Insecurity: No Food Insecurity (01/19/2023)   Received [facility]   Hunger Vital Sign    Worried About Running Out of Food in the Last Year: Never true    Ran Out of Food in the Last Year: Never  true  Transportation Needs: No Transportation Needs (01/19/2023)   Received from [facility] - Transportation    In the past 12 months, has lack of transportation kept you from medical appointments or from getting medications?: No    Lack of Transportation (Non-Medical): No  Physical Activity: Not on file  Stress: Not on file  Social Connections: Not on file  Intimate Partner Violence: Not At Risk (04/17/2022)   Humiliation, Afraid, Rape, and Kick questionnaire    Fear of Current or Ex-Partner: No    Emotionally Abused: No    Physically Abused: No    Sexually Abused: No    FAMILY HISTORY: History reviewed. No pertinent family history.  ALLERGIES:  is allergic to erythromycin, hydrocodone-acetaminophen, and lexapro [escitalopram oxalate].  MEDICATIONS:  Current Outpatient Medications  Medication Sig Dispense Refill   ascorbic acid (VITAMIN C) 500 MG tablet Take 500 mg by mouth daily.     Cholecalciferol 50 MCG (2000 UT) CAPS Take 2,000 Units by mouth daily.     collagenase (SANTYL) 250 UNIT/GM ointment Apply 1 Application topically daily.     cyclobenzaprine (FLEXERIL) 10 MG tablet Take 10 mg by mouth 2 (two) times daily as needed.     dalfampridine 10 MG TB12 Take 1 tablet by mouth every 12 (twelve) hours.     DULoxetine (CYMBALTA) 30 MG capsule Take 30 mg by mouth daily.     gabapentin (NEURONTIN) 800 MG tablet Take 400-800 mg by mouth 2 (two) times daily.     imipramine (TOFRANIL) 25 MG tablet Take 25 mg by mouth at bedtime.     midodrine (PROAMATINE) 5 MG tablet Take 1 tablet (5 mg total) by mouth 2 (two) times daily with a meal. 14 tablet 0   omeprazole (PRILOSEC) 20 MG capsule Take 20 mg by mouth daily.     oxybutynin (DITROPAN-XL) 10 MG 24 hr tablet Take 10 mg by mouth daily.     pregabalin (LYRICA) 75 MG capsule Take 75 mg by mouth 3 (three) times daily.     propranolol (INDERAL) 20 MG tablet Take 20 mg by mouth 2 (two) times daily.      rivaroxaban (XARELTO) 20 MG TABS tablet Take 1 tablet (20 mg total) by mouth daily with supper. 30 tablet 0   rOPINIRole (REQUIP) 0.5 MG tablet Take 0.5 mg by mouth at bedtime.     tolterodine (DETROL LA) 2 MG 24 hr capsule Take 2 mg by mouth daily.     traMADol (ULTRAM) 50 MG tablet Take 50 mg by mouth 2 (two) times daily as needed.     traZODone (DESYREL) 50 MG tablet Take 50 mg by mouth at bedtime.     trimethoprim (TRIMPEX) 100 MG tablet Take 100 mg by mouth daily.     clindamycin (CLEOCIN) 300 MG capsule Take 300 mg by mouth 3 (three) times daily. (Patient not taking: Reported on 03/05/2023)     levofloxacin (LEVAQUIN) 750 MG tablet Take 1 tablet (750  mg total) by mouth daily. (Patient not taking: Reported on 03/05/2023) 30 tablet 1   No current facility-administered medications for this visit.    Review of Systems  Constitutional:  Positive for appetite change and fatigue. Negative for chills and fever.  HENT:   Negative for hearing loss and voice change.   Eyes:  Negative for eye problems.  Respiratory:  Negative for chest tightness, cough and shortness of breath.   Cardiovascular:  Negative for chest pain.  Gastrointestinal:  Negative for abdominal distention, abdominal pain and blood in stool.  Endocrine: Negative for hot flashes.  Genitourinary:  Negative for difficulty urinating and frequency.   Musculoskeletal:  Negative for arthralgias and back pain.  Skin:  Negative for itching and rash.       Sacrum ulcer  Neurological:  Negative for extremity weakness.  Hematological:  Negative for adenopathy.  Psychiatric/Behavioral:  Negative for confusion.      PHYSICAL EXAMINATION: ECOG PERFORMANCE STATUS: 2 - Symptomatic, <50% confined to bed  Vitals:   03/05/23 1034  BP: (!) 104/59  Pulse: 84  Resp: 18  Temp: (!) 96.1 F (35.6 C)  SpO2: 99%   There were no vitals filed for this visit.  Physical Exam Constitutional:      General: She is not in acute distress.     Comments: Patient sits in wheelchair  HENT:     Head: Normocephalic and atraumatic.  Eyes:     General: No scleral icterus. Cardiovascular:     Rate and Rhythm: Normal rate.  Pulmonary:     Effort: Pulmonary effort is normal. No respiratory distress.  Abdominal:     General: There is no distension.     Palpations: Abdomen is soft.  Musculoskeletal:     Cervical back: Normal range of motion and neck supple.  Skin:    Coloration: Skin is not pale.  Neurological:     Mental Status: She is alert and oriented to person, place, and time. Mental status is at baseline.     Motor: No abnormal muscle tone.  Psychiatric:        Mood and Affect: Affect normal.      LABORATORY DATA:  I have reviewed the data as listed    Latest Ref Rng & Units 03/05/2023   11:00 AM 07/03/2022   11:45 AM 06/17/2022    9:17 AM  CBC  WBC 4.0 - 10.5 K/uL 7.4  12.3  17.3   Hemoglobin 12.0 - 15.0 g/dL 19.1  8.3  9.2   Hematocrit 36.0 - 46.0 % 45.1  28.6  31.8   Platelets 150 - 400 K/uL 197  426  493       Latest Ref Rng & Units 05/28/2022   12:00 PM 04/21/2022    6:18 AM 04/20/2022    5:07 AM  CMP  Glucose 70 - 99 mg/dL 478   99   BUN 6 - 20 mg/dL 11   8   Creatinine 2.95 - 1.00 mg/dL 6.21  3.08  6.57   Sodium 135 - 145 mmol/L 130   137   Potassium 3.5 - 5.1 mmol/L 3.5  4.4  3.4   Chloride 98 - 111 mmol/L 95   104   CO2 22 - 32 mmol/L 23   24   Calcium 8.9 - 10.3 mg/dL 8.5   8.2   Total Protein 6.5 - 8.1 g/dL 5.8     Total Bilirubin 0.3 - 1.2 mg/dL 0.5     Alkaline Phos  38 - 126 U/L 298     AST 15 - 41 U/L 32     ALT 0 - 44 U/L 17        RADIOGRAPHIC STUDIES: I have personally reviewed the radiological images as listed and agreed with the findings in the report. No results found.

## 2023-03-05 NOTE — Assessment & Plan Note (Addendum)
Continue Xarelto, managed by PCP.  Consider decrease to 10mg  daily for prophylaxis.

## 2023-03-05 NOTE — Assessment & Plan Note (Addendum)
Previous work up  JAK2 V617F mutation negative, with reflex to other mutations CALR, MPL, JAK 2 Ex 12-15 mutations negative. EBV negative.  CMV negative, BCR-ABL FISH negative.  Peripheral blood flow cytometry showed monocytosis 17% of the leukocytes, with immunophenotypic aberrancies-CD56 in a subset and down-regulation of CD13.  A nonspecific finding that can be seen in association with reactive/active processes as well as neoplastic processes.  No immature B cells detected in the specimen.  Bone marrow biopsy findings are not diagnostic for neoplastic process.  Normal cytogenetics.  NGS showed TET2 mutation Today's cbc showed no leukocytosis.  I recommend to continue to monitor her blood count.

## 2023-03-15 ENCOUNTER — Encounter: Payer: Medicare Other | Attending: Physician Assistant | Admitting: Physician Assistant

## 2023-03-15 DIAGNOSIS — Z7901 Long term (current) use of anticoagulants: Secondary | ICD-10-CM | POA: Insufficient documentation

## 2023-03-15 DIAGNOSIS — I2699 Other pulmonary embolism without acute cor pulmonale: Secondary | ICD-10-CM | POA: Diagnosis not present

## 2023-03-15 DIAGNOSIS — L89154 Pressure ulcer of sacral region, stage 4: Secondary | ICD-10-CM | POA: Diagnosis present

## 2023-03-15 DIAGNOSIS — M4628 Osteomyelitis of vertebra, sacral and sacrococcygeal region: Secondary | ICD-10-CM | POA: Insufficient documentation

## 2023-03-15 DIAGNOSIS — G35 Multiple sclerosis: Secondary | ICD-10-CM | POA: Insufficient documentation

## 2023-03-15 DIAGNOSIS — I82409 Acute embolism and thrombosis of unspecified deep veins of unspecified lower extremity: Secondary | ICD-10-CM | POA: Insufficient documentation

## 2023-03-15 NOTE — Progress Notes (Addendum)
[name] ([id number]) [id number] Page 1 of 9 Visit Report for 03/15/2023 Arrival Information Details Patient Name: Date of Service: [name] STRA ND, [name] RIELLEN 03/15/2023 1:15 PM Medical Record Number: [id number] Patient Account Number: [id number] Date of Birth/Sex: Treating RN: 1961-01-23 (62 y.o. [name] Primary Care Jesson Foskey: Enid Baas Other Clinician: Referring Astraea Gaughran: Treating Ramia Sidney/Extender: Fredrich Birks Weeks in Treatment: 64 Visit Information History Since Last Visit Added or deleted any medications: No Patient Arrived: Wheel Chair Any new allergies or adverse reactions: No Arrival Time: 13:27 Has Dressing in Place as Prescribed: Yes Accompanied By: boyfirend Pain Present Now: No Transfer Assistance: Nurse, adult Patient Identification Verified: Yes Secondary Verification Process Completed: Yes Patient Requires Transmission-Based Precautions: No Patient Has Alerts: Yes Patient Alerts: Patient on Blood Thinner Not Diabetic Xarelto Electronic Signature(s) Signed: 03/15/2023 2:27:19 PM By: Midge Aver MSN RN CNS WTA Previous Signature: 03/15/2023 2:26:59 PM Version By: Midge Aver MSN RN CNS WTA Entered By: Midge Aver on 03/15/2023 11:27:19 -------------------------------------------------------------------------------- Clinic Level of Care Assessment Details Patient Name: Date of Service: [name] RIELLEN 03/15/2023 1:15 PM Medical Record Number: [id number] Patient Account Number: [id number] Date of Birth/Sex: Treating RN: [birth year] (62 y.o. [name] Primary Care Euclid Cassetta: Enid Baas Other Clinician: Referring Aliz Meritt: Treating Jazelyn Sipe/Extender: Fredrich Birks Weeks in Treatment: 33 Clinic Level of Care Assessment Items TOOL 4 Quantity Score X- 1 0 Use when only an EandM is performed on FOLLOW-UP visit ASSESSMENTS - Nursing Assessment /  Reassessment X- 1 10 Reassessment of Co-morbidities (includes updates in patient status) X- 1 5 Reassessment of Adherence to Treatment Plan ASSESSMENTS - Wound and Skin Assessment / Reassessment KAMBRI, BARTKIEWICZ ([id number]) [phone].pdf Page 2 of 9 X- 1 5 Simple Wound Assessment / Reassessment - one wound []  - 0 Complex Wound Assessment / Reassessment - multiple wounds []  - 0 Dermatologic / Skin Assessment (not related to wound area) ASSESSMENTS - Focused Assessment []  - 0 Circumferential Edema Measurements - multi extremities []  - 0 Nutritional Assessment / Counseling / Intervention []  - 0 Lower Extremity Assessment (monofilament, tuning fork, pulses) []  - 0 Peripheral Arterial Disease Assessment (using hand held doppler) ASSESSMENTS - Ostomy and/or Continence Assessment and Care []  - 0 Incontinence Assessment and Management []  - 0 Ostomy Care Assessment and Management (repouching, etc.) PROCESS - Coordination of Care X - Simple Patient / Family Education for ongoing care 1 15 []  - 0 Complex (extensive) Patient / Family Education for ongoing care X- 1 10 Staff obtains Chiropractor, Records, T Results / Process Orders est []  - 0 Staff telephones HHA, Nursing Homes / Clarify orders / etc []  - 0 Routine Transfer to another Facility (non-emergent condition) []  - 0 Routine Hospital Admission (non-emergent condition) []  - 0 New Admissions / Manufacturing engineer / Ordering NPWT Apligraf, etc. , []  - 0 Emergency Hospital Admission (emergent condition) X- 1 10 Simple Discharge Coordination []  - 0 Complex (extensive) Discharge Coordination PROCESS - Special Needs []  - 0 Pediatric / Minor Patient Management []  - 0 Isolation Patient Management []  - 0 Hearing / Language / Visual special needs []  - 0 Assessment of Community assistance (transportation, D/C planning, etc.) []  - 0 Additional assistance / Altered mentation []  - 0 Support  Surface(s) Assessment (bed, cushion, seat, etc.) INTERVENTIONS - Wound Cleansing / Measurement X - Simple Wound Cleansing - one wound 1 5 []  - 0 Complex Wound Cleansing - multiple wounds X- 1 5 Wound Imaging (photographs - any number  of wounds) []  - 0 Wound Tracing (instead of photographs) X- 1 5 Simple Wound Measurement - one wound []  - 0 Complex Wound Measurement - multiple wounds INTERVENTIONS - Wound Dressings X - Small Wound Dressing one or multiple wounds 1 10 []  - 0 Medium Wound Dressing one or multiple wounds []  - 0 Large Wound Dressing one or multiple wounds []  - 0 Application of Medications - topical []  - 0 Application of Medications - injection INTERVENTIONS - Miscellaneous []  - 0 External ear exam []  - 0 Specimen Collection (cultures, biopsies, blood, body fluids, etc.) TAMMYE, ROESE ([id number]) [phone].pdf Page 3 of 9 []  - 0 Specimen(s) / Culture(s) sent or taken to Lab for analysis X- 1 10 Patient Transfer (multiple staff / Michiel Sites Lift / Similar devices) []  - 0 Simple Staple / Suture removal (25 or less) []  - 0 Complex Staple / Suture removal (26 or more) []  - 0 Hypo / Hyperglycemic Management (close monitor of Blood Glucose) []  - 0 Ankle / Brachial Index (ABI) - do not check if billed separately X- 1 5 Vital Signs Has the patient been seen at the hospital within the last three years: Yes Total Score: 95 Level Of Care: New/Established - Level 3 Electronic Signature(s) Signed: 03/17/2023 11:59:16 AM By: Midge Aver MSN RN CNS WTA Entered By: Midge Aver on 03/15/2023 11:28:52 -------------------------------------------------------------------------------- Complex / Palliative Patient Assessment Details Patient Name: Date of Service: [name] RIELLEN 03/15/2023 1:15 PM Medical Record Number: [id number] Patient Account Number: [id number] Date of Birth/Sex: Treating RN: 12/31/1960 (62 y.o. [name] Primary Care Gerod Caligiuri: Enid Baas Other Clinician: Referring Ilaria Much: Treating Deziya Amero/Extender: Fredrich Birks Weeks in Treatment: 44 Complex Wound Management Criteria Patient has remarkable or complex co-morbidities requiring medications or treatments that extend wound healing times. Examples: Diabetes mellitus with chronic renal failure or end stage renal disease requiring dialysis Advanced or poorly controlled rheumatoid arthritis Diabetes mellitus and end stage chronic obstructive pulmonary disease Active cancer with current chemo- or radiation therapy MS Wheelchair bound Palliative Wound Management Criteria Care Approach Wound Care Plan: Complex Wound Management Electronic Signature(s) Signed: 03/29/2023 10:27:56 PM By: Elliot Gurney, BSN, RN, CWS, Kim RN, BSN Signed: 04/02/2023 12:21:49 PM By: Allen Derry PA-C Entered By: Elliot Gurney, BSN, RN, CWS, Kim on 03/29/2023 19:27:56 [name] ([id number]) [id number] Page 4 of 9 -------------------------------------------------------------------------------- Encounter Discharge Information Details Patient Name: Date of Service: [name] RIELLEN 03/15/2023 1:15 PM Medical Record Number: [id number] Patient Account Number: [id number] Date of Birth/Sex: Treating RN: 01/16/1961 (63 y.o. [name] Primary Care Rayana Geurin: Enid Baas Other Clinician: Referring Askari Kinley: Treating Ayisha Pol/Extender: Fredrich Birks Weeks in Treatment: 54 Encounter Discharge Information Items Discharge Condition: Stable Ambulatory Status: Wheelchair Discharge Destination: Home Transportation: Private Auto Accompanied By: boyfriend Schedule Follow-up Appointment: Yes Clinical Summary of Care: Electronic Signature(s) Signed: 03/15/2023 2:30:12 PM By: Midge Aver MSN RN CNS WTA Entered By: Midge Aver on 03/15/2023  11:30:11 -------------------------------------------------------------------------------- Lower Extremity Assessment Details Patient Name: Date of Service: [name] STRA ND, [name] RIELLEN 03/15/2023 1:15 PM Medical Record Number: [id number] Patient Account Number: [id number] Date of Birth/Sex: Treating RN: 1961-03-16 (61 y.o. [name] Primary Care Ahnika Hannibal: Enid Baas Other Clinician: Referring Ilija Maxim: Treating Oralee Rapaport/Extender: Fredrich Birks Weeks in Treatment: 47 Electronic Signature(s) Signed: 03/15/2023 2:27:51 PM By: Midge Aver MSN RN CNS WTA Entered By: Midge Aver on 03/15/2023 11:27:51 -------------------------------------------------------------------------------- Multi Wound Chart Details Patient Name: Date of Service: V A NNO  STRA ND, [name] RIELLEN 03/15/2023 1:15 PM Medical Record Number: [id number] Patient Account Number: [id number] Date of Birth/Sex: Treating RN: Aug 14, 1960 (62 y.o. [name] Primary Care Armstead Heiland: Enid Baas Other Clinician: Referring Sylvia Helms: Treating Dorella Laster/Extender: Fredrich Birks Weeks in Treatment: [address] [place], [name] ([id number]) [id number] Page 5 of 9 Height(in): Pulse(bpm): 73 Weight(lbs): Blood Pressure(mmHg): 120/85 Body Mass Index(BMI): Temperature(F): 98.2 Respiratory Rate(breaths/min): 18 [3:Photos:] [N/A:N/A] Proximal, Midline Sacrum N/A N/A Wound Location: Pressure Injury N/A N/A Wounding Event: Pressure Ulcer N/A N/A Primary Etiology: Angina, Hypotension N/A N/A Comorbid History: 03/09/2022 N/A N/A Date Acquired: 15 N/A N/A Weeks of Treatment: Open N/A N/A Wound Status: No N/A N/A Wound Recurrence: 0.2x0.1x1.5 N/A N/A Measurements L x W x D (cm) 0.016 N/A N/A A (cm) : rea 0.024 N/A N/A Volume (cm) : 99.90% N/A N/A % Reduction in A rea: 100.00% N/A N/A % 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 N/A  N/A Classification: Large N/A N/A Exudate A mount: Serosanguineous N/A N/A Exudate Type: red, brown N/A N/A Exudate Color: Epibole N/A N/A Wound Margin: Large (67-100%) N/A N/A Granulation A mount: Red, Hyper-granulation N/A N/A Granulation Quality: Small (1-33%) N/A N/A Necrotic A mount: Fat Layer (Subcutaneous Tissue): Yes N/A N/A Exposed Structures: Muscle: Yes Fascia: No Tendon: No Joint: No Bone: No None N/A N/A Epithelialization: Treatment Notes Wound #3 (Sacrum) Wound Laterality: Midline, Proximal Cleanser Vashe 5.8 (oz) Discharge Instruction: Use vashe 5.8 (oz) as directed Peri-Wound Care Topical Primary Dressing Endoform Natural, Non-fenestrated, 2x2 (in/in) Secondary Dressing ABD Pad 5x9 (in/in) Discharge Instruction: Cover with ABD pad Secured With Paper Tape, 0.5x10 (in/yd) Compression Wrap Compression Stockings Add-Ons Electronic Signature(s) Signed: 03/15/2023 2:27:56 PM By: Midge Aver MSN RN CNS WTA Entered By: Midge Aver on 03/15/2023 11:27:55 [name] ([id number]) [id number] Page 6 of 9 -------------------------------------------------------------------------------- Multi-Disciplinary Care Plan Details Patient Name: Date of Service: [name] RIELLEN 03/15/2023 1:15 PM Medical Record Number: [id number] Patient Account Number: [id number] Date of Birth/Sex: Treating RN: 02-20-1961 (63 y.o. [name] Primary Care Ayyub Krall: Enid Baas Other Clinician: Referring Gwyndolyn Guilford: Treating Vylet Maffia/Extender: Fredrich Birks Weeks in Treatment: 49 Active Inactive Wound/Skin Impairment Nursing Diagnoses: Impaired tissue integrity Knowledge deficit related to ulceration/compromised skin integrity Goals: Patient/caregiver will verbalize understanding of skin care regimen Date Initiated: 05/06/2022 Date Inactivated: 07/29/2022 Target Resolution Date: 08/28/2022 Goal Status:  Met Ulcer/skin breakdown will have a volume reduction of 30% by week 4 Date Initiated: 05/06/2022 Date Inactivated: 07/29/2022 Target Resolution Date: 06/06/2022 Goal Status: Unmet Unmet Reason: not 30% decrease Ulcer/skin breakdown will have a volume reduction of 50% by week 8 Date Initiated: 05/20/2022 Date Inactivated: 07/29/2022 Target Resolution Date: 07/05/2022 Unmet Reason: not 50% decrease in Goal Status: Unmet size Ulcer/skin breakdown will have a volume reduction of 80% by week 12 Date Initiated: 05/20/2022 Date Inactivated: 07/29/2022 Target Resolution Date: 08/05/2022 Unmet Reason: not 80% decrease in Goal Status: Unmet size Ulcer/skin breakdown will heal within 14 weeks Date Initiated: 05/20/2022 Target Resolution Date: 04/10/2023 Goal Status: Active Interventions: Assess patient/caregiver ability to perform ulcer/skin care regimen upon admission and as needed Assess ulceration(s) every visit Provide education on ulcer and skin care Screen for HBO Treatment Activities: Skin care regimen initiated : 05/06/2022 Topical wound management initiated : 05/06/2022 Notes: Electronic Signature(s) Signed: 03/15/2023 2:29:14 PM By: Midge Aver MSN RN CNS WTA Entered By: Midge Aver on 03/15/2023 11:29:14 [name] ([id number]) [id number] Page 7 of 9 -------------------------------------------------------------------------------- Pain Assessment Details Patient Name: Date of Service: V A NNO  STRA ND, [name] RIELLEN 03/15/2023 1:15 PM Medical Record Number: [id number] Patient Account Number: [id number] Date of Birth/Sex: Treating RN: 1960/04/16 (62 y.o. [name] Primary Care Armany Mano: Enid Baas Other Clinician: Referring Pasha Broad: Treating Dailee Manalang/Extender: Fredrich Birks Weeks in Treatment: 28 Active Problems Location of Pain Severity and Description of Pain Patient Has Paino No Site Locations Pain Management and  Medication Current Pain Management: Electronic Signature(s) Signed: 03/15/2023 2:27:42 PM By: Midge Aver MSN RN CNS WTA Entered By: Midge Aver on 03/15/2023 11:27:42 -------------------------------------------------------------------------------- Patient/Caregiver Education Details Patient Name: Date of Service: [name] RIELLEN 12/2/2024andnbsp1:15 PM Medical Record Number: [id number] Patient Account Number: [id number] Date of Birth/Gender: Treating RN: Nov 29, 1960 (62 y.o. [name] Primary Care Physician: Enid Baas Other Clinician: Referring Physician: Treating Physician/Extender: Fredrich Birks Weeks in Treatment: [address] [place] ([id number]) [id number] Page 8 of 9 Education Assessment Education Provided To: Patient Education Topics Provided Wound/Skin Impairment: Handouts: Caring for Your Ulcer Methods: Explain/Verbal Responses: State content correctly Electronic Signature(s) Signed: 03/17/2023 11:59:16 AM By: Midge Aver MSN RN CNS WTA Entered By: Midge Aver on 03/15/2023 11:29:25 -------------------------------------------------------------------------------- Wound Assessment Details Patient Name: Date of Service: [name] RIELLEN 03/15/2023 1:15 PM Medical Record Number: [id number] Patient Account Number: [id number] Date of Birth/Sex: Treating RN: 1960/05/05 (62 y.o. [name] Primary Care Caylor Tallarico: Enid Baas Other Clinician: Referring Modene Andy: Treating Maurene Hollin/Extender: Fredrich Birks Weeks in Treatment: 44 Wound Status Wound Number: 3 Primary Etiology: Pressure Ulcer Wound Location: Proximal, Midline Sacrum Wound Status: Open Wounding Event: Pressure Injury Comorbid History: Angina, Hypotension Date Acquired: 03/09/2022 Weeks Of Treatment: 44 Clustered Wound: No Photos Wound Measurements Length: (cm) 0.2 Width: (cm) 0.1 Depth: (cm)  1.5 Area: (cm) 0.016 Volume: (cm) 0.024 % Reduction in Area: 99.9% % Reduction in Volume: 100% Epithelialization: None Wound Description Classification: Category/Stage IV Wound Margin: Epibole Exudate Amount: Large Zullo, Naylah ([id number]) Exudate Type: Serosanguineous Exudate Color: red, brown Foul Odor After Cleansing: No Slough/Fibrino Yes [id number] Page 9 of 9 Wound Bed Granulation Amount: Large (67-100%) Exposed Structure Granulation Quality: Red, Hyper-granulation Fascia Exposed: No Necrotic Amount: Small (1-33%) Fat Layer (Subcutaneous Tissue) Exposed: Yes Tendon Exposed: No Muscle Exposed: Yes Necrosis of Muscle: No Joint Exposed: No Bone Exposed: No Electronic Signature(s) Signed: 03/17/2023 11:59:16 AM By: Midge Aver MSN RN CNS WTA Entered By: Midge Aver on 03/15/2023 10:45:32 -------------------------------------------------------------------------------- Vitals Details Patient Name: Date of Service: [name] RIELLEN 03/15/2023 1:15 PM Medical Record Number: [id number] Patient Account Number: [id number] Date of Birth/Sex: Treating RN: January 24, 1961 (62 y.o. [name] Primary Care Chellsie Gomer: Enid Baas Other Clinician: Referring Keysi Oelkers: Treating Hrithik Boschee/Extender: Fredrich Birks Weeks in Treatment: 22 Vital Signs Time Taken: 13:43 Temperature (F): 98.2 Pulse (bpm): 73 Respiratory Rate (breaths/min): 18 Blood Pressure (mmHg): 120/85 Reference Range: 80 - 120 mg / dl Electronic Signature(s) Signed: 03/15/2023 2:27:36 PM By: Midge Aver MSN RN CNS WTA Entered By: Midge Aver on 03/15/2023 11:27:36

## 2023-03-15 NOTE — Progress Notes (Addendum)
[name], [name] ([id number]) [id number] Page 1 of 8 Visit Report for 03/15/2023 Chief Complaint Document Details Patient Name: Date of Service: [name] STRA ND, [name] 03/15/2023 1:15 PM Medical Record Number: [id number] Patient Account Number: [id number] Date of Birth/Sex: Treating RN: April 09, 1961 (62 y.o. [name] Primary Care Provider: Enid Baas Other Clinician: Referring Provider: Treating Provider/Extender: Fredrich Birks Weeks in Treatment: 47 Information Obtained from: Patient Chief Complaint 05/06/2022; Bilateral feet wounds and sacral wounds Electronic Signature(s) Signed: 03/15/2023 1:27:40 PM By: Allen Derry PA-C Entered By: Allen Derry on 03/15/2023 13:27:40 -------------------------------------------------------------------------------- HPI Details Patient Name: Date of Service: [name] ND, [name] 03/15/2023 1:15 PM Medical Record Number: [id number] Patient Account Number: [id number] Date of Birth/Sex: Treating RN: December 23, 1960 (62 y.o. [name] Primary Care Provider: Enid Baas Other Clinician: Referring Provider: Treating Provider/Extender: Fredrich Birks Weeks in Treatment: 26 History of Present Illness HPI Description: 05/06/2022 [name] is a 62 year old female with a past medical history of multiple sclerosis and PEs and DVT that presents to the clinic for sacral s wounds and Bilateral feet wounds. Patient states that she obtained the sacral ulcers when she was hospitalized in November 2023 for bilateral pulmonary embolisms. She was in the ICU on a ventilator. Since discharge she has been using Dakin's wet-to-dry dressings to the area. Since her hospitalization she has been bedbound. However she is doing physical therapy. Prior to being hospitalized she was able to pull herself up and transfer from bed to chair on her own. Due to her MS she is not  fully mobile. Unfortunately she states that the wound has gotten larger on her sacrum despite Wound care. Patient has home health. On 04/16/2022 she was admitted to the hospital for severe sepsis secondary to acute UTI and sacral cellulitis. She completed 5 days of IV cefepime and vancomycin. She was not discharged with oral antibiotics. She states she developed the bilateral feet wounds at that time. She states she has bunny boots however she is not wearing them. She currently denies systemic signs of infection. 2/7; patient presents for follow-up. She had a bone biopsy done at last clinic visit that showed fragments of inflamed granulation tissue, necrotic material and bone with acute osteomyelitis. Culture grew Proteus mirabilis and Klebsiella pneumoniae. Patient is scheduled to see infectious disease next week. For now she has been doing Dakin's wet-to-dry dressings to the sacrum. She is not able to obtain Santyl and has been keeping the left heel covered. T the right foot where o there was a previous wound with scab however this has healed. This has remained closed. No open wound noted. She states over the past week she has had chills but no fevers, nausea/vomiting or purulent drainage. 2/21; patient presents for follow-up. She saw Dr. Joylene Draft on 2/15. 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 Today she had feces Luquillo, [name] ([id number]) [id number] Page 2 of 8 impacted in the wound and throughout the tunneling. We discussed a diverting colostomy to address this issue. She was agreeable with a referral to general surgery. She currently denies systemic signs of infection. She has been using Medihoney and Hydrofera Blue to the left heel wound. She has been using her Prevalon boots. 3/20; patient presents for follow-up. She has been using Dakin's wet-to-dry dressings to the sacral wound. She has developed a new wound to her right  lateral heel. She is using Medihoney and Hydrofera Blue to the left heel. She is  currently taking Levaquin to complete 6 weeks. She follows with infectious disease for this. She saw general surgery at [facility] to discuss potentially doing a diverting colostomy however per patient's surgeon did not recommend this. 4/17; patient presents for follow-up. Has been using Dakin's wet-to-dry dressings to the sacral wound. She has been using Medihoney and Hydrofera Blue to the heel wounds. She states she is using her bunny boots for offloading the heels. She has not heard from plastic surgery for consultation. We gave her the number to call to follow this up. 5/22; patient presents for follow-up. She saw Dr. Ferd Hibbs on 5/8, plastic surgery to discuss potentially doing surgical debridement with muscle flap. Per Dr. Timothy Lasso note Review of the surgery and requirements were discussed with the patient. Patient would like to hold off on proceeding with this option. Wound VAC was recommended and based on the wound today this option seemed reasonable to get started. Patient has home health that will be able to change the wound VAC. She has been using Dakin's wet-to-dry dressings to the sacrum. Patient has a new wound to the left medial ankle. She states she wears bunny boots but does not while in the wheelchair. 6/1; the patient has started the wound VAC as of Monday to the sacral area. Home health is out to change the dressing. She also has wounds on her bilateral ankles and left heel she is using Medihoney and Hydrofera Blue to these areas. She has advanced MS with contractures. Both the patient and her husband say she is eating a high-protein dilated well. She has some form of air mattress but the bed is from sleep number. I am uncertain about the offloading part of this 6/12; patient presents for follow-up. She has had a wound VAC to the sacral wound and home health has been changing this. She has had no issues with  this. She has been using Hydrofera Blue and Medihoney to the bilateral ankle and left heel wounds. The right ankle wound is healed. 6/26; patient presents for follow-up. Has been using a wound VAC to the sacral wound. She has no issues with this. Wound is smaller. She had ABIs on the right that were 1.27 and on the left that was 1.35 with a TBI of 1.18. She had triphasic waveforms throughout. She is been using Hydrofera Blue and Medihoney to the left heel wounds. These have closed. She denies signs of infection. 7/10; patient presents for follow-up. She continues to use a wound VAC to the sacral wound. She has slight skin irritation to the periwound from the drape. Overall wound appears healing. She has no other open wounds to her lower extremities. She currently denies systemic signs of infection. 7/24; patient presents for follow-up. She continues to use the wound VAC to the sacral wound. There is no issues or complaints. 8/7; patient presents for follow-up. She has been using the wound VAC with benefit. The wound is smaller. She is has no issues or complaints today. Home health has been coming out and changing the wound VAC 3x A week. 9/11; patient presents for follow-up. Patient elected to stop the wound VAC 2 weeks ago as it has been a hassle to keep a seal. She has been using Dakin's wet-to-dry dressings. She has no issues or complaints. She denies signs of infection. 9/25; 2-week follow-up for this patient with advanced MS. 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 This is apparently come in quite a bit. They have been using  Dakin's wet-to-dry for quite a period of time now. Her boyfriend changes the dressing. She is careful about offloading this, has a wheelchair cushion etc. 10/9; 2-week follow-up. Substantially improved sacral wound. She has been left with a small wound. I changed her to Prisma last week to see if we can stimulate some granulation to the small remaining  area. It sounds as though she is up in a wheelchair but her friend is careful to manipulate this so she is not putting too much pressure on the lower coccyx area. 10/22; patient with a smaller ulcer on the lower sacrum/coccyx. We have been using silver collagen moistened with hydrogel change every 1 to 2 days. Last time she was here she had protuberant granulation which I knocked back with silver nitrate there is none of this today. 02-17-2023 upon evaluation today patient's wound actually showing signs of having a small pocket fortunately there is no evidence of infection she has made a lot of progress here but this is still very difficult to get the close completely as far as the depth is concerned. Fortunately I do not see any signs of infection at this time. 03-03-2023 upon evaluation today patient appears to be doing well currently in regard to her wound which is showing signs of improvement although it is very slow. Fortunately I do not see any signs of active infection at this time which is good news. No fevers, chills, nausea, vomiting, or diarrhea. 03-15-2023 upon evaluation today patient appears to be doing well currently in regard to her wound the last little bit of this that is remaining open right now is very tiny. There does not appear to be any signs of active infection at this time. No fevers, chills, nausea, vomiting, or diarrhea. Electronic Signature(s) Signed: 03/15/2023 2:03:39 PM By: Allen Derry PA-C Entered By: Allen Derry on 03/15/2023 14:03:39 -------------------------------------------------------------------------------- Physical Exam Details Patient Name: Date of Service: [name] ND, [name] 03/15/2023 1:15 PM Medical Record Number: [id number] Patient Account Number: [id number] Date of Birth/Sex: Treating RN: December 07, 1960 (62 y.o. Takira, Danehy, Teagen ([id number]) [id number] Page 3 of 8 Primary Care Provider: Enid Baas Other Clinician: Referring Provider: Treating Provider/Extender: Fredrich Birks Weeks in Treatment: 58 Constitutional Well-nourished and well-hydrated in no acute distress. Respiratory normal breathing without difficulty. Psychiatric this patient is able to make decisions and demonstrates good insight into disease process. Alert and Oriented x 3. pleasant and cooperative. Notes Patient's wound bed actually showed signs of having just a very tiny opening remaining at this point. Fortunately I think we are getting very close to closure over not quite there yet. Electronic Signature(s) Signed: 03/15/2023 2:04:05 PM By: Allen Derry PA-C Entered By: Allen Derry on 03/15/2023 14:04:04 -------------------------------------------------------------------------------- Physician Orders Details Patient Name: Date of Service: [name] ND, [name] 03/15/2023 1:15 PM Medical Record Number: [id number] Patient Account Number: [id number] Date of Birth/Sex: Treating RN: [birth year] (62 y.o. [name] Primary Care Provider: Enid Baas Other Clinician: Referring Provider: Treating Provider/Extender: Fredrich Birks Weeks in Treatment: 73 The following information was scribed by: Midge Aver The information was scribed for: Allen Derry Verbal / Phone Orders: No Diagnosis Coding ICD-10 Coding Code Description L89.154 Pressure ulcer of sacral region, stage 4 I26.99 Other pulmonary embolism without acute cor pulmonale 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, sacral and sacrococcygeal region G35 Multiple sclerosis I82.409 Acute embolism and thrombosis of unspecified deep veins of unspecified lower extremity Z79.01 Long term (current) use of anticoagulants  Home Health Home Health Company: - [facility] CONTINUE Home Health for wound care. May utilize formulary equivalent dressing for wound treatment orders unless otherwise specified. Home Health Nurse  may visit PRN to address patients wound care needs. - Endoform cut in a small oblong piece and packed into the wound with a skinny cotton tipped applicator Wound Treatment Wound #3 - Sacrum Wound Laterality: Midline, Proximal Cleanser: Vashe 5.8 (oz) Every Other Day/30 Days Discharge Instructions: Use vashe 5.8 (oz) as directed Prim Dressing: Endoform Natural, Non-fenestrated, 2x2 (in/in) ary Every Other Day/30 Days Secondary Dressing: ABD Pad 5x9 (in/in) Every Other Day/30 Days Discharge Instructions: Cover with ABD pad [name] ([id number]) [id number] Page 4 of 8 Secured With: Paper Tape, 0.5x10 (in/yd) Every Other Day/30 Days Electronic Signature(s) Signed: 03/15/2023 2:28:15 PM By: Midge Aver MSN RN CNS WTA Signed: 03/15/2023 3:39:27 PM By: Allen Derry PA-C Entered By: Midge Aver on 03/15/2023 14:28:14 -------------------------------------------------------------------------------- Problem List Details Patient Name: Date of Service: [name] ND, [name] 03/15/2023 1:15 PM Medical Record Number: [id number] Patient Account Number: [id number] Date of Birth/Sex: Treating RN: May 23, 1960 (62 y.o. [name] Primary Care Provider: Enid Baas Other Clinician: Referring Provider: Treating Provider/Extender: Fredrich Birks Weeks in Treatment: 20 Active Problems ICD-10 Encounter Code Description Active Date MDM Diagnosis L89.154 Pressure ulcer of sacral region, stage 4 05/06/2022 No Yes I26.99 Other pulmonary embolism without acute cor pulmonale 05/06/2022 No Yes 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, sacral and sacrococcygeal region 05/20/2022 No Yes G35 Multiple sclerosis 05/06/2022 No Yes I82.409 Acute embolism and thrombosis of unspecified deep veins of unspecified 05/06/2022 No Yes lower extremity Z79.01 Long term (current) use of anticoagulants 05/06/2022 No Yes Inactive Problems ICD-10 Code Description Active  Date Inactive Date L89.153 Pressure ulcer of sacral region, stage 3 05/06/2022 05/06/2022 Resolved Problems ICD-10 MAYZIE, RICKENBACH ([id number]) [phone].pdf Page 5 of 8 Code Description Active Date Resolved Date L89.620 Pressure ulcer of left heel, unstageable 05/06/2022 05/06/2022 L89.510 Pressure ulcer of right ankle, unstageable 09/02/2022 09/02/2022 L89.523 Pressure ulcer of left ankle, stage 3 09/02/2022 09/02/2022 Electronic Signature(s) Signed: 03/15/2023 2:29:35 PM By: Midge Aver MSN RN CNS WTA Signed: 03/15/2023 3:39:27 PM By: Allen Derry PA-C Previous Signature: 03/15/2023 1:27:36 PM Version By: Allen Derry PA-C Entered By: Midge Aver on 03/15/2023 14:29:35 -------------------------------------------------------------------------------- Progress Note Details Patient Name: Date of Service: [name] ND, [name] 03/15/2023 1:15 PM Medical Record Number: [id number] Patient Account Number: [id number] Date of Birth/Sex: Treating RN: Dec 21, 1960 (62 y.o. [name] Primary Care Provider: Enid Baas Other Clinician: Referring Provider: Treating Provider/Extender: Fredrich Birks Weeks in Treatment: 42 Subjective Chief Complaint Information obtained from Patient 05/06/2022; Bilateral feet wounds and sacral wounds History of Present Illness (HPI) 05/06/2022 [name] is a 62 year old female with a past medical history of multiple sclerosis and PEs and DVT that presents to the clinic for sacral s wounds and Bilateral feet wounds. Patient states that she obtained the sacral ulcers when she was hospitalized in November 2023 for bilateral pulmonary embolisms. She was in the ICU on a ventilator. Since discharge she has been using Dakin's wet-to-dry dressings to the area. Since her hospitalization she has been bedbound. However she is doing physical therapy. Prior to being hospitalized she was able to pull herself up  and transfer from bed to chair on her own. Due to her MS she is not fully mobile. Unfortunately she states that the wound has gotten larger on her sacrum despite Wound care. Patient has  home health. On 04/16/2022 she was admitted to the hospital for severe sepsis secondary to acute UTI and sacral cellulitis. She completed 5 days of IV cefepime and vancomycin. She was not discharged with oral antibiotics. She states she developed the bilateral feet wounds at that time. She states she has bunny boots however she is not wearing them. She currently denies systemic signs of infection. 2/7; patient presents for follow-up. She had a bone biopsy done at last clinic visit that showed fragments of inflamed granulation tissue, necrotic material and bone with acute osteomyelitis. Culture grew Proteus mirabilis and Klebsiella pneumoniae. Patient is scheduled to see infectious disease next week. For now she has been doing Dakin's wet-to-dry dressings to the sacrum. She is not able to obtain Santyl and has been keeping the left heel covered. T the right foot where o there was a previous wound with scab however this has healed. This has remained closed. No open wound noted. She states over the past week she has had chills but no fevers, nausea/vomiting or purulent drainage. 2/21; patient presents for follow-up. She saw Dr. Joylene Draft on 2/15. 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 Today she had feces impacted in the wound and throughout the tunneling. We discussed a diverting colostomy to address this issue. She was agreeable with a referral to general surgery. She currently denies systemic signs of infection. She has been using Medihoney and Hydrofera Blue to the left heel wound. She has been using her Prevalon boots. 3/20; patient presents for follow-up. She has been using Dakin's wet-to-dry dressings to the sacral wound. She has developed a new wound to her right lateral heel. She is using  Medihoney and Hydrofera Blue to the left heel. She is currently taking Levaquin to complete 6 weeks. She follows with infectious disease for this. She saw general surgery at [facility] to discuss potentially doing a diverting colostomy however per patient's surgeon did not recommend this. 4/17; patient presents for follow-up. Has been using Dakin's wet-to-dry dressings to the sacral wound. She has been using Medihoney and Hydrofera Blue to the heel wounds. She states she is using her bunny boots for offloading the heels. She has not heard from plastic surgery for consultation. We gave her the number to call to follow this up. 5/22; patient presents for follow-up. She saw Dr. Ferd Hibbs on 5/8, plastic surgery to discuss potentially doing surgical debridement with muscle flap. Per Dr. Timothy Lasso note Review of the surgery and requirements were discussed with the patient. Patient would like to hold off on proceeding with this option. Wound VAC was recommended and based on the wound today this option seemed reasonable to get started. Patient has home health that will be able to change the wound VAC. She has been using Dakin's wet-to-dry dressings to the sacrum. Patient has a new wound to the left medial ankle. She states she wears bunny boots but does not while in the wheelchair. LARON, PIQUE ([id number]) [id number] Page 6 of 8 6/1; the patient has started the wound VAC as of Monday to the sacral area. Home health is out to change the dressing. She also has wounds on her bilateral ankles and left heel she is using Medihoney and Hydrofera Blue to these areas. She has advanced MS with contractures. Both the patient and her husband say she is eating a high-protein dilated well. She has some form of air mattress but the bed is from sleep number. I am uncertain about the offloading part of this 6/12;  patient presents for follow-up. She has had a wound VAC to the sacral wound and  home health has been changing this. She has had no issues with this. She has been using Hydrofera Blue and Medihoney to the bilateral ankle and left heel wounds. The right ankle wound is healed. 6/26; patient presents for follow-up. Has been using a wound VAC to the sacral wound. She has no issues with this. Wound is smaller. She had ABIs on the right that were 1.27 and on the left that was 1.35 with a TBI of 1.18. She had triphasic waveforms throughout. She is been using Hydrofera Blue and Medihoney to the left heel wounds. These have closed. She denies signs of infection. 7/10; patient presents for follow-up. She continues to use a wound VAC to the sacral wound. She has slight skin irritation to the periwound from the drape. Overall wound appears healing. She has no other open wounds to her lower extremities. She currently denies systemic signs of infection. 7/24; patient presents for follow-up. She continues to use the wound VAC to the sacral wound. There is no issues or complaints. 8/7; patient presents for follow-up. She has been using the wound VAC with benefit. The wound is smaller. She is has no issues or complaints today. Home health has been coming out and changing the wound VAC 3x A week. 9/11; patient presents for follow-up. Patient elected to stop the wound VAC 2 weeks ago as it has been a hassle to keep a seal. She has been using Dakin's wet-to-dry dressings. She has no issues or complaints. She denies signs of infection. 9/25; 2-week follow-up for this patient with advanced MS. 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 This is apparently come in quite a bit. They have been using Dakin's wet-to-dry for quite a period of time now. Her boyfriend changes the dressing. She is careful about offloading this, has a wheelchair cushion etc. 10/9; 2-week follow-up. Substantially improved sacral wound. She has been left with a small wound. I changed her to Prisma last week to  see if we can stimulate some granulation to the small remaining area. It sounds as though she is up in a wheelchair but her friend is careful to manipulate this so she is not putting too much pressure on the lower coccyx area. 10/22; patient with a smaller ulcer on the lower sacrum/coccyx. We have been using silver collagen moistened with hydrogel change every 1 to 2 days. Last time she was here she had protuberant granulation which I knocked back with silver nitrate there is none of this today. 02-17-2023 upon evaluation today patient's wound actually showing signs of having a small pocket fortunately there is no evidence of infection she has made a lot of progress here but this is still very difficult to get the close completely as far as the depth is concerned. Fortunately I do not see any signs of infection at this time. 03-03-2023 upon evaluation today patient appears to be doing well currently in regard to her wound which is showing signs of improvement although it is very slow. Fortunately I do not see any signs of active infection at this time which is good news. No fevers, chills, nausea, vomiting, or diarrhea. 03-15-2023 upon evaluation today patient appears to be doing well currently in regard to her wound the last little bit of this that is remaining open right now is very tiny. There does not appear to be any signs of active infection at this  time. No fevers, chills, nausea, vomiting, or diarrhea. Objective Constitutional Well-nourished and well-hydrated in no acute distress. Vitals Time Taken: 1:43 PM, Temperature: 98.2 F, Pulse: 73 bpm, Respiratory Rate: 18 breaths/min, Blood Pressure: 120/85 mmHg. Respiratory normal breathing without difficulty. Psychiatric this patient is able to make decisions and demonstrates good insight into disease process. Alert and Oriented x 3. pleasant and cooperative. General Notes: Patient's wound bed actually showed signs of having just a very tiny  opening remaining at this point. Fortunately I think we are getting very close to closure over not quite there yet. Integumentary (Hair, Skin) Wound #3 status is Open. Original cause of wound was Pressure Injury. The date acquired was: 03/09/2022. The wound has been in treatment 44 weeks. The wound is located on the Proximal,Midline Sacrum. The wound measures 0.2cm length x 0.1cm width x 1.5cm depth; 0.016cm^2 area and 0.024cm^3 volume. There is muscle and Fat Layer (Subcutaneous Tissue) exposed. There is a large amount of serosanguineous drainage noted. The wound margin is epibole. There is large (67-100%) red, hyper - granulation within the wound bed. There is a small (1-33%) amount of necrotic tissue within the wound bed. Assessment Active Problems ICD-10 Pressure ulcer of sacral region, stage 4 Other pulmonary embolism without acute cor pulmonale Osteomyelitis of vertebra, sacral and sacrococcygeal region Multiple sclerosis KRISTELLA, THRON ([id number]) [id number] Page 7 of 8 Acute embolism and thrombosis of unspecified deep veins of unspecified lower extremity Long term (current) use of anticoagulants Plan 1. I would recommend that we have the patient continue to monitor for any signs of infection or worsening. Based on what I see I do feel like that we are making really good headway here towards closure which is great news. 2. Also can recommend that the patient should continue to utilize the endoform which I think has been doing well. 3. I am also going to recommend she should continue to offload is much as possible the more of this she can do the better. We will see patient back for reevaluation in 2 weeks here in the clinic. If anything worsens or changes patient will contact our office for additional recommendations. Electronic Signature(s) Signed: 03/15/2023 2:04:42 PM By: Allen Derry PA-C Entered By: Allen Derry on 03/15/2023  14:04:42 -------------------------------------------------------------------------------- SuperBill Details Patient Name: Date of Service: [name] ND, [name] 03/15/2023 Medical Record Number: [id number] Patient Account Number: [id number] Date of Birth/Sex: Treating RN: 06/12/1960 (63 y.o. 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

## 2023-03-30 ENCOUNTER — Encounter: Payer: Self-pay | Admitting: Neurology

## 2023-03-30 ENCOUNTER — Ambulatory Visit (INDEPENDENT_AMBULATORY_CARE_PROVIDER_SITE_OTHER): Payer: Medicare Other | Admitting: Neurology

## 2023-03-30 VITALS — BP 103/68 | HR 87 | Resp 15 | Ht 62.0 in

## 2023-03-30 DIAGNOSIS — G35 Multiple sclerosis: Secondary | ICD-10-CM | POA: Insufficient documentation

## 2023-03-30 DIAGNOSIS — G8220 Paraplegia, unspecified: Secondary | ICD-10-CM

## 2023-03-30 NOTE — Progress Notes (Signed)
Chief Complaint  Patient presents with   New Patient (Initial Visit)    Rm15, boyfriend present, ms: has contractures, here to see if can get botox in legs       ASSESSMENT AND PLAN  [name] is a 62 y.o. female   Secondary progressive multiple sclerosis Spastic paraplegia  Prior authorization for xeomin injection, 600 units,  Return to clinic in 1 month  She will also benefit orthopedic evaluation for potential hamstring tendon release  DIAGNOSTIC DATA (LABS, IMAGING, TESTING) - I reviewed patient records, labs, notes, testing and imaging myself where available.   MEDICAL HISTORY:  [name], is a 62 year old female, accompanied by her boyfriend seen in request by   neurologist Morene Crocker, at [facility] for evaluation of botulism toxin injection for spastic bilateral lower extremity, her primary care physician is Dr. Nemiah Commander, [place], initial evaluation was on March 30, 2023  History is obtained from the patient and review of electronic medical records. I personally reviewed pertinent available imaging films in PACS.   PMHx of  Restless leg syndrome Urinary incontinence. GERD DVT-PE during COVID in 2023,  Depression, Chronic insominia Neuropathic pain,  She was diagnosed with relapsing remitting multiple sclerosis around 1998, presenting with gait abnormality also complains of hand clumsiness, paresthesia, she was diagnosed and treated at [place] until she moved to [place] in 2021, she was treated with injectable Avonex, from 1998-2011, then switched to Gilenya from 2011-2021, now receiving oculus and at home infusions since she moved to [place]  At baseline prior to her COVID infection in November 2023, she was able to ambulate with walker, doing basic house chores, she suffered significant setback since her COVID infection, she had bilateral lower extremity DVT, pulmonary emboli, had pulmonary  thrombectomy, massive amount of clot removed,acute hypoxic respiratory failure, bilateral pneumonia, pulmonary hemorrhage, then hospital admission in January 2024 for sepsis secondary to UTI, sacral cellulitis,  Since above severe medical issues, she spent lengthy time in her bed, despite rehabilitation, she has developed severe contraction of bilateral lower extremity, now wheelchair-bound, difficult to straighten out her lower extremity, could not bear weight, needing help in transportation,  She is hoping to receive botulism toxin injection to relax bilateral lower extremity contraction, help weightbearing and transfer  Today's examination showed painful lower extremity fixed knee flexion, limited range of motion, tight bilateral hamstring muscles  PHYSICAL EXAM:   Vitals:   03/30/23 0815  BP: 103/68  Pulse: 87  Resp: 15  Height: 5\' 2"  (1.575 m)     Body mass index is 23.78 kg/m.  PHYSICAL EXAMNIATION:  Gen: NAD, conversant, well nourised, well groomed                     Cardiovascular: Regular rate rhythm, no peripheral edema, warm, nontender. Eyes: Conjunctivae clear without exudates or hemorrhage Neck: Supple, no carotid bruits. Pulmonary: Clear to auscultation bilaterally   NEUROLOGICAL EXAM:  MENTAL STATUS: Speech/cognition: Awake, alert, oriented to history taking and casual conversation CRANIAL NERVES: CN II: Visual fields are full to confrontation. Pupils are round equal and briskly reactive to light. CN III, IV, VI: extraocular movement are normal. No ptosis. CN V: Facial sensation is intact to light touch CN VII: Face is symmetric with normal eye closure  CN VIII: Hearing is normal to causal conversation. CN IX, X: Phonation is normal. CN XI: Head turning and shoulder shrug are intact  MOTOR: Mild bilateral shoulder abduction, grip weakness, left worse  than right, mild fixation of left upper extremity on rapid rotating movement.  Fixed contraction of  bilateral lower extremity, bilateral hip adduction, even with passive stretch, maximum right knee was about 100, left was 110 degree, complains of bilateral hamstring muscle and around knee tendon pain  REFLEXES: Hypoactive  SENSORY: Length-dependent sensory changes  COORDINATION: There is no trunk or limb dysmetria noted.  GAIT/STANCE: Wheelchair-bound  REVIEW OF SYSTEMS:  Full 14 system review of systems performed and notable only for as above All other review of systems were negative.   ALLERGIES: Allergies  Allergen Reactions   Erythromycin Hives and Nausea And Vomiting   Hydrocodone-Acetaminophen Other (See Comments)    Paranoid and depressed    Lexapro [Escitalopram Oxalate] Hives    HOME MEDICATIONS: Current Outpatient Medications  Medication Sig Dispense Refill   ascorbic acid (VITAMIN C) 500 MG tablet Take 500 mg by mouth daily.     Cholecalciferol 50 MCG (2000 UT) CAPS Take 2,000 Units by mouth daily.     clindamycin (CLEOCIN) 300 MG capsule Take 300 mg by mouth 3 (three) times daily.     collagenase (SANTYL) 250 UNIT/GM ointment Apply 1 Application topically daily.     dalfampridine 10 MG TB12 Take 1 tablet by mouth every 12 (twelve) hours.     DULoxetine (CYMBALTA) 30 MG capsule Take 30 mg by mouth daily.     gabapentin (NEURONTIN) 800 MG tablet Take 400-800 mg by mouth 2 (two) times daily.     imipramine (TOFRANIL) 25 MG tablet Take 25 mg by mouth at bedtime.     levofloxacin (LEVAQUIN) 750 MG tablet Take 1 tablet (750 mg total) by mouth daily. 30 tablet 1   midodrine (PROAMATINE) 5 MG tablet Take 1 tablet (5 mg total) by mouth 2 (two) times daily with a meal. 14 tablet 0   omeprazole (PRILOSEC) 20 MG capsule Take 20 mg by mouth daily.     oxybutynin (DITROPAN-XL) 10 MG 24 hr tablet Take 10 mg by mouth daily.     pregabalin (LYRICA) 75 MG capsule Take 75 mg by mouth 3 (three) times daily.     propranolol (INDERAL) 20 MG tablet Take 20 mg by mouth 2 (two) times  daily.     rivaroxaban (XARELTO) 20 MG TABS tablet Take 1 tablet (20 mg total) by mouth daily with supper. 30 tablet 0   rOPINIRole (REQUIP) 0.5 MG tablet Take 0.5 mg by mouth at bedtime.     traMADol (ULTRAM) 50 MG tablet Take 50 mg by mouth 2 (two) times daily as needed.     traZODone (DESYREL) 50 MG tablet Take 50 mg by mouth at bedtime.     trimethoprim (TRIMPEX) 100 MG tablet Take 100 mg by mouth daily.     No current facility-administered medications for this visit.    PAST MEDICAL HISTORY: Past Medical History:  Diagnosis Date   Bilateral pulmonary embolism (HCC)    Chronic constipation    Family history of pancreatic cancer    Frequent falls    GERD (gastroesophageal reflux disease)    Hiatal hernia    Multiple sclerosis (HCC)    Pulmonary hemorrhage    RLS (restless legs syndrome)    Sepsis due to pneumonia (HCC)    Tachycardia     PAST SURGICAL HISTORY: Past Surgical History:  Procedure Laterality Date   ESOPHAGOGASTRODUODENOSCOPY (EGD) WITH PROPOFOL N/A 09/11/2021   Procedure: ESOPHAGOGASTRODUODENOSCOPY (EGD) WITH PROPOFOL;  Surgeon: Jaynie Collins, DO;  Location: [facility] ENDOSCOPY;  Service: Gastroenterology;  Laterality: N/A;   PULMONARY THROMBECTOMY Bilateral 02/25/2022   Procedure: PULMONARY THROMBECTOMY;  Surgeon: Annice Needy, MD;  Location: [facility] INVASIVE CV LAB;  Service: Cardiovascular;  Laterality: Bilateral;   TUBAL LIGATION      FAMILY HISTORY: History reviewed. No pertinent family history.  SOCIAL HISTORY: Social History   Socioeconomic History   Marital status: Widowed    Spouse name: Not on file   Number of children: Not on file   Years of education: Not on file   Highest education level: Not on file  Occupational History   Not on file  Tobacco Use   Smoking status: Never   Smokeless tobacco: Never  Vaping Use   Vaping status: Never Used  Substance and Sexual Activity   Alcohol use: Never   Drug use: Never   Sexual activity: Not  Currently  Other Topics Concern   Not on file  Social History Narrative   Not on file   Social Drivers of Health   Financial Resource Strain: Low Risk  (01/19/2023)   Received from [facility]   Overall Financial Resource Strain (CARDIA)    Difficulty of Paying Living Expenses: Not hard at all  Food Insecurity: No Food Insecurity (01/19/2023)   Received from [facility]   Hunger Vital Sign    Worried About Running Out of Food in the Last Year: Never true    Ran Out of Food in the Last Year: Never true  Transportation Needs: No Transportation Needs (01/19/2023)   Received from [facility] - Transportation    In the past 12 months, has lack of transportation kept you from medical appointments or from getting medications?: No    Lack of Transportation (Non-Medical): No  Physical Activity: Not on file  Stress: Not on file  Social Connections: Not on file  Intimate Partner Violence: Not At Risk (04/17/2022)   Humiliation, Afraid, Rape, and Kick questionnaire    Fear of Current or Ex-Partner: No    Emotionally Abused: No    Physically Abused: No    Sexually Abused: No      Levert Feinstein, M.D. Ph.D.  [facility] [address] [place], [place] [address] Ph: [phone] Fax: [phone]  CC:  Morene Crocker, MD [phone] [facility] [address] [facility] [place],  [place] [address]  Enid Baas, MD

## 2023-03-31 ENCOUNTER — Telehealth: Payer: Self-pay

## 2023-03-31 ENCOUNTER — Encounter: Payer: Medicare Other | Admitting: Physician Assistant

## 2023-03-31 DIAGNOSIS — L89154 Pressure ulcer of sacral region, stage 4: Secondary | ICD-10-CM | POA: Diagnosis not present

## 2023-03-31 NOTE — Telephone Encounter (Signed)
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

## 2023-04-01 NOTE — Progress Notes (Addendum)
MAKAYLYN, GRIEPENTROG ([id number]) [id number] Page 1 of 9 Visit Report for 03/31/2023 Arrival Information Details Patient Name: Date of Service: [name] STRA [name], [name] 03/31/2023 2:15 PM Medical Record Number: [id number] Patient Account Number: [id number] Date of Birth/Sex: Treating RN: 1961-03-23 (62 y.o. [name] Primary Care [name]: Enid Baas Other Clinician: Referring [name]: Treating [name]/Extender: [name] Weeks in Treatment: 81 Visit Information History Since Last Visit Added or deleted any medications: No Patient Arrived: Wheel Chair Any new allergies or adverse reactions: No Arrival Time: 14:31 Had a fall or experienced change in No Accompanied By: boyfriend activities of daily living that may affect Transfer Assistance: Michiel Sites Lift risk of falls: Patient Identification Verified: Yes Hospitalized since last visit: No Secondary Verification Process Completed: Yes Has Dressing in Place as Prescribed: Yes Patient Requires Transmission-Based Precautions: No Pain Present Now: No Patient Has Alerts: Yes Patient Alerts: Patient on Blood Thinner Not Diabetic Xarelto Electronic Signature(s) Signed: 04/02/2023 12:01:12 PM By: [name] Aver MSN RN CNS WTA Entered By: [name] on 03/31/2023 11:32:16 -------------------------------------------------------------------------------- Clinic Level of Care Assessment Details Patient Name: Date of Service: [name] STRA [name], [name] 03/31/2023 2:15 PM Medical Record Number: [id number] Patient Account Number: [id number] Date of Birth/Sex: Treating RN: Mar 03, 1961 (62 y.o. [name] Primary Care [name]: Enid Baas Other Clinician: Referring [name]: Treating [name]/Extender: [name] Weeks in Treatment: 47 Clinic Level of Care Assessment Items TOOL 4 Quantity Score X- 1 0 Use when only an EandM is performed on  FOLLOW-UP visit ASSESSMENTS - Nursing Assessment / Reassessment X- 1 10 Reassessment of Co-morbidities (includes updates in patient status) X- 1 5 Reassessment of Adherence to Treatment Plan ASSESSMENTS - Wound and Skin A ssessment / Reassessment X - Simple Wound Assessment / Reassessment - one wound 1 5 [name], [name] ([id number]) [id number] Page 2 of 9 []  - 0 Complex Wound Assessment / Reassessment - multiple wounds []  - 0 Dermatologic / Skin Assessment (not related to wound area) ASSESSMENTS - Focused Assessment []  - 0 Circumferential Edema Measurements - multi extremities []  - 0 Nutritional Assessment / Counseling / Intervention []  - 0 Lower Extremity Assessment (monofilament, tuning fork, pulses) []  - 0 Peripheral Arterial Disease Assessment (using hand held doppler) ASSESSMENTS - Ostomy and/or Continence Assessment and Care []  - 0 Incontinence Assessment and Management []  - 0 Ostomy Care Assessment and Management (repouching, etc.) PROCESS - Coordination of Care X - Simple Patient / Family Education for ongoing care 1 15 []  - 0 Complex (extensive) Patient / Family Education for ongoing care X- 1 10 Staff obtains Chiropractor, Records, T Results / Process Orders est []  - 0 Staff telephones HHA, Nursing Homes / Clarify orders / etc []  - 0 Routine Transfer to another Facility (non-emergent condition) []  - 0 Routine Hospital Admission (non-emergent condition) []  - 0 New Admissions / Manufacturing engineer / Ordering NPWT Apligraf, etc. , []  - 0 Emergency Hospital Admission (emergent condition) X- 1 10 Simple Discharge Coordination []  - 0 Complex (extensive) Discharge Coordination PROCESS - Special Needs []  - 0 Pediatric / Minor Patient Management []  - 0 Isolation Patient Management []  - 0 Hearing / Language / Visual special needs []  - 0 Assessment of Community assistance (transportation, D/C planning, etc.) []  - 0 Additional  assistance / Altered mentation []  - 0 Support Surface(s) Assessment (bed, cushion, seat, etc.) INTERVENTIONS - Wound Cleansing / Measurement X - Simple Wound Cleansing - one wound 1 5 []  - 0 Complex Wound Cleansing -  multiple wounds X- 1 5 Wound Imaging (photographs - any number of wounds) []  - 0 Wound Tracing (instead of photographs) X- 1 5 Simple Wound Measurement - one wound []  - 0 Complex Wound Measurement - multiple wounds INTERVENTIONS - Wound Dressings X - Small Wound Dressing one or multiple wounds 1 10 []  - 0 Medium Wound Dressing one or multiple wounds []  - 0 Large Wound Dressing one or multiple wounds []  - 0 Application of Medications - topical []  - 0 Application of Medications - injection INTERVENTIONS - Miscellaneous []  - 0 External ear exam []  - 0 Specimen Collection (cultures, biopsies, blood, body fluids, etc.) MARKEESHA, [name] ([id number]) [id number] Page 3 of 9 []  - 0 Specimen(s) / Culture(s) sent or taken to Lab for analysis X- 1 10 Patient Transfer (multiple staff / Michiel Sites Lift / Similar devices) []  - 0 Simple Staple / Suture removal (25 or less) []  - 0 Complex Staple / Suture removal (26 or more) []  - 0 Hypo / Hyperglycemic Management (close monitor of Blood Glucose) []  - 0 Ankle / Brachial Index (ABI) - do not check if billed separately X- 1 5 Vital Signs Has the patient been seen at the hospital within the last three years: Yes Total Score: 95 Level Of Care: New/Established - Level 3 Electronic Signature(s) Signed: 04/02/2023 12:01:12 PM By: [name] Aver MSN RN CNS WTA Entered By: [name] on 03/31/2023 12:20:36 -------------------------------------------------------------------------------- Encounter Discharge Information Details Patient Name: Date of Service: [name] Glen [name], [name] 03/31/2023 2:15 PM Medical Record Number: [id number] Patient Account Number: [id number] Date of Birth/Sex: Treating  RN: 06-24-1960 (62 y.o. [name] Primary Care Siris Hoos: Enid Baas Other Clinician: Referring Omid Deardorff: Treating [name]/Extender: [name] Weeks in Treatment: 42 Encounter Discharge Information Items Discharge Condition: Stable Ambulatory Status: Wheelchair Discharge Destination: Home Transportation: Private Auto Accompanied By: boyfriend Schedule Follow-up Appointment: Yes Clinical Summary of Care: Electronic Signature(s) Signed: 03/31/2023 3:21:37 PM By: [name] Aver MSN RN CNS WTA Entered By: [name] on 03/31/2023 12:21:37 -------------------------------------------------------------------------------- Lower Extremity Assessment Details Patient Name: Date of Service: [name] Glen [name], [name] 03/31/2023 2:15 PM Medical Record Number: [id number] Patient Account Number: [id number] Date of Birth/Sex: Treating RN: 08-10-1960 (62 y.o. [name], [name], [name] ([id number]) [phone].pdf Page 4 of 9 Primary Care Analleli Gierke: Enid Baas Other Clinician: Referring Roark Rufo: Treating Oluwasemilore Pascuzzi/Extender: [name] Weeks in Treatment: 51 Electronic Signature(s) Signed: 04/02/2023 12:01:12 PM By: [name] Aver MSN RN CNS WTA Entered By: [name] on 03/31/2023 11:49:35 -------------------------------------------------------------------------------- Multi Wound Chart Details Patient Name: Date of Service: [name] Glen [name], [name] 03/31/2023 2:15 PM Medical Record Number: [id number] Patient Account Number: [id number] Date of Birth/Sex: Treating RN: [birth year] (62 y.o. [name] Primary Care Dalayza Zambrana: Enid Baas Other Clinician: Referring Domino Holten: Treating Sirus Labrie/Extender: [name] Weeks in Treatment: 8 Vital Signs Height(in): Pulse(bpm): 65 Weight(lbs): Blood Pressure(mmHg): 115/63 Body Mass Index(BMI): Temperature(F):  98.1 Respiratory Rate(breaths/min): 16 [3:Photos:] [N/A:N/A] Proximal, Midline Sacrum N/A N/A Wound Location: Pressure Injury N/A N/A Wounding Event: Pressure Ulcer N/A N/A Primary Etiology: Angina, Hypotension N/A N/A Comorbid History: 03/09/2022 N/A N/A Date Acquired: 46 N/A N/A Weeks of Treatment: Open N/A N/A Wound Status: No N/A N/A Wound Recurrence: 0.2x0.1x0.8 N/A N/A Measurements L x W x D (cm) 0.016 N/A N/A A (cm) : rea 0.013 N/A N/A Volume (cm) : 99.90% N/A N/A % Reduction in A rea: 100.00% N/A N/A % 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 N/A N/A  Classification: Large N/A N/A Exudate A mount: Serosanguineous N/A N/A Exudate Type: red, brown N/A N/A Exudate Color: Epibole N/A N/A Wound Margin: Large (67-100%) N/A N/A Granulation A mount: Red, Hyper-granulation N/A N/A Granulation Quality: Small (1-33%) N/A N/A Necrotic A mount: Fascia: No N/A N/A Exposed Structures: Fat Layer (Subcutaneous Tissue): No Tendon: No Muscle: No Joint: No Bone: No Medium (34-66%) N/A N/A EpithelializationArnetha [name] ([id number]) [id number] Page 5 of 9 Treatment Notes Electronic Signature(s) Signed: 03/31/2023 3:18:43 PM By: [name] Aver MSN RN CNS WTA Entered By: [name] on 03/31/2023 12:18:43 -------------------------------------------------------------------------------- Multi-Disciplinary Care Plan Details Patient Name: Date of Service: [name] Glen [name], [name] 03/31/2023 2:15 PM Medical Record Number: [id number] Patient Account Number: [id number] Date of Birth/Sex: Treating RN: May 07, 1960 (62 y.o. [name] Primary Care Ignacia Gentzler: Enid Baas Other Clinician: Referring Luisalberto Beegle: Treating Akiya Morr/Extender: [name] Weeks in Treatment: 45 Active Inactive Wound/Skin Impairment Nursing Diagnoses: Impaired tissue integrity Knowledge deficit related to ulceration/compromised  skin integrity Goals: Patient/caregiver will verbalize understanding of skin care regimen Date Initiated: 05/06/2022 Date Inactivated: 07/29/2022 Target Resolution Date: 08/28/2022 Goal Status: Met Ulcer/skin breakdown will have a volume reduction of 30% by week 4 Date Initiated: 05/06/2022 Date Inactivated: 07/29/2022 Target Resolution Date: 06/06/2022 Goal Status: Unmet Unmet Reason: not 30% decrease Ulcer/skin breakdown will have a volume reduction of 50% by week 8 Date Initiated: 05/20/2022 Date Inactivated: 07/29/2022 Target Resolution Date: 07/05/2022 Unmet Reason: not 50% decrease in Goal Status: Unmet size Ulcer/skin breakdown will have a volume reduction of 80% by week 12 Date Initiated: 05/20/2022 Date Inactivated: 07/29/2022 Target Resolution Date: 08/05/2022 Unmet Reason: not 80% decrease in Goal Status: Unmet size Ulcer/skin breakdown will heal within 14 weeks Date Initiated: 05/20/2022 Target Resolution Date: 04/10/2023 Goal Status: Active Interventions: Assess patient/caregiver ability to perform ulcer/skin care regimen upon admission and as needed Assess ulceration(s) every visit Provide education on ulcer and skin care Screen for HBO Treatment Activities: Skin care regimen initiated : 05/06/2022 Topical wound management initiated : 05/06/2022 Notes: Electronic Signature(s) Signed: 03/31/2023 3:20:49 PM By: [name] Aver MSN RN CNS WTA Entered By: [name] on 03/31/2023 12:20:49 [name] ([id number]) [id number] Page 6 of 9 -------------------------------------------------------------------------------- Pain Assessment Details Patient Name: Date of Service: [name] Glen [name], [name] 03/31/2023 2:15 PM Medical Record Number: [id number] Patient Account Number: [id number] Date of Birth/Sex: Treating RN: [birth year] (62 y.o. [name] Primary Care Juergen Hardenbrook: Enid Baas Other Clinician: Referring Kierre Hintz: Treating  Isiaah Cuervo/Extender: [name] Weeks in Treatment: 55 Active Problems Location of Pain Severity and Description of Pain Patient Has Paino No Site Locations Pain Management and Medication Current Pain Management: Electronic Signature(s) Signed: 04/02/2023 12:01:12 PM By: [name] Aver MSN RN CNS WTA Entered By: [name] on 03/31/2023 11:47:44 -------------------------------------------------------------------------------- Patient/Caregiver Education Details Patient Name: Date of Service: [name] Glen [name], [name] 12/18/2024andnbsp2:15 PM Medical Record Number: [id number] Patient Account Number: [id number] Date of Birth/Gender: Treating RN: 12/19/1960 (62 y.o. [name] Primary Care Physician: Enid Baas Other Clinician: Referring Physician: Treating Physician/Extender: [name] [place], [place] ([id number]) [phone].pdf Page 7 of 9 Weeks in Treatment: 60 Education Assessment Education Provided To: Patient Education Topics Provided Wound/Skin Impairment: Handouts: Caring for Your Ulcer Methods: Explain/Verbal Responses: State content correctly Electronic Signature(s) Signed: 04/02/2023 12:01:12 PM By: [name] Aver MSN RN CNS WTA Entered By: [name] on 03/31/2023 12:21:01 -------------------------------------------------------------------------------- Wound Assessment Details Patient Name: Date of Service: [name] A NNO STRA [name], [name] 03/31/2023  2:15 PM Medical Record Number: [id number] Patient Account Number: [id number] Date of Birth/Sex: Treating RN: Sep 04, 1960 (62 y.o. [name] Primary Care Crystallee Werden: Enid Baas Other Clinician: Referring Alexiz Cothran: Treating Tayia Stonesifer/Extender: [name] Weeks in Treatment: 47 Wound Status Wound Number: 3 Primary Etiology: Pressure Ulcer Wound Location: Proximal, Midline Sacrum Wound Status: Open Wounding  Event: Pressure Injury Comorbid History: Angina, Hypotension Date Acquired: 03/09/2022 Weeks Of Treatment: 47 Clustered Wound: No Photos Wound Measurements Length: (cm) 0.2 Width: (cm) 0.1 Depth: (cm) 0.8 Area: (cm) 0.016 Volume: (cm) 0.013 % Reduction in Area: 99.9% % Reduction in Volume: 100% Epithelialization: Medium (34-66%) Tunneling: No Undermining: No Wound Description Classification: Category/Stage IV [name], [name] ([id number]) Wound Margin: Epibole Exudate Amount: Large Exudate Type: Serosanguineous Exudate Color: red, brown Foul Odor After Cleansing: No [id number] Page 8 of 9 Slough/Fibrino No Wound Bed Granulation Amount: Large (67-100%) Exposed Structure Granulation Quality: Red, Hyper-granulation Fascia Exposed: No Necrotic Amount: Small (1-33%) Fat Layer (Subcutaneous Tissue) Exposed: No Tendon Exposed: No Muscle Exposed: No Joint Exposed: No Bone Exposed: No Treatment Notes Wound #3 (Sacrum) Wound Laterality: Midline, Proximal Cleanser Vashe 5.8 (oz) Discharge Instruction: Use vashe 5.8 (oz) as directed Peri-Wound Care Topical Primary Dressing Endoform Natural, Non-fenestrated, 2x2 (in/in) Secondary Dressing ABD Pad 5x9 (in/in) Discharge Instruction: Cover with ABD pad Secured With Paper Tape, 0.5x10 (in/yd) Compression Wrap Compression Stockings Add-Ons Electronic Signature(s) Signed: 04/02/2023 12:01:12 PM By: [name] Aver MSN RN CNS WTA Entered By: [name] on 03/31/2023 11:49:20 -------------------------------------------------------------------------------- Vitals Details Patient Name: Date of Service: [name] Glen [name], [name] 03/31/2023 2:15 PM Medical Record Number: [id number] Patient Account Number: [id number] Date of Birth/Sex: Treating RN: October 13, 1960 (62 y.o. [name] Primary Care Donyel Nester: Enid Baas Other Clinician: Referring Azie Mcconahy: Treating Eward Rutigliano/Extender: [name] Weeks in Treatment: 50 Vital Signs Time Taken: 14:35 Temperature (F): 98.1 Pulse (bpm): 65 Respiratory Rate (breaths/min): 16 Blood Pressure (mmHg): 115/63 Reference Range: 80 - 120 mg / dl [name], [name] ([id number]) [id number] Page 9 of 9 Electronic Signature(s) Signed: 04/02/2023 12:01:12 PM By: [name] Aver MSN RN CNS WTA Entered By: [name] on 03/31/2023 11:47:38

## 2023-04-01 NOTE — Progress Notes (Addendum)
RAKEL, SUMEY ([id number]) [id number] Page 1 of 8 Visit Report for 03/31/2023 Chief Complaint Document Details Patient Name: Date of Service: [name] STRA [name], [name] 03/31/2023 2:15 PM Medical Record Number: [id number] Patient Account Number: [id number] Date of Birth/Sex: Treating RN: [birth year] (62 y.o. [name]: Referring Provider: Treating Provider/Extender: Fredrich Birks Weeks in Treatment: 37 Information Obtained from: Patient Chief Complaint 05/06/2022; Bilateral feet wounds and sacral wounds Electronic Signature(s) Signed: 03/31/2023 2:18:24 PM By: Allen Derry PA-C Entered By: Allen Derry on 03/31/2023 11:18:24 -------------------------------------------------------------------------------- HPI Details Patient Name: Date of Service: [name], [name] 03/31/2023 2:15 PM Medical Record Number: [id number] Patient Account Number: [id number] Date of Birth/Sex: Treating RN: [birth year] (62 y.o. [name]: Referring Provider: Treating Provider/Extender: Fredrich Birks Weeks in Treatment: 56 History of Present Illness HPI Description: 05/06/2022 [name] is a 62 year old female with a past medical history of multiple sclerosis and PEs and DVT that presents to the clinic for sacral s wounds and Bilateral feet wounds. Patient states that she obtained the sacral ulcers when she was hospitalized in November 2023 for bilateral pulmonary embolisms. She was in the ICU on a ventilator. Since discharge she has been using Dakin's wet-to-dry dressings to the area. Since her hospitalization she has been bedbound. However she is doing physical therapy. Prior to being hospitalized she was able to pull herself up and transfer from bed to chair on her own. Due to her MS she is  not fully mobile. Unfortunately she states that the wound has gotten larger on her sacrum despite Wound care. Patient has home health. On 04/16/2022 she was admitted to the hospital for severe sepsis secondary to acute UTI and sacral cellulitis. She completed 5 days of IV cefepime and vancomycin. She was not discharged with oral antibiotics. She states she developed the bilateral feet wounds at that time. She states she has bunny boots however she is not wearing them. She currently denies systemic signs of infection. 2/7; patient presents for follow-up. She had a bone biopsy done at last clinic visit that showed fragments of inflamed granulation tissue, necrotic material and bone with acute osteomyelitis. Culture grew Proteus mirabilis and Klebsiella pneumoniae. Patient is scheduled to see infectious disease next week. For now she has been doing Dakin's wet-to-dry dressings to the sacrum. She is not able to obtain Santyl and has been keeping the left heel covered. T the right foot where o there was a previous wound with scab however this has healed. This has remained closed. No open wound noted. She states over the past week she has had chills but no fevers, nausea/vomiting or purulent drainage. 2/21; patient presents for follow-up. She saw Dr. Joylene Draft on 2/15. 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 Today she had feces SIDRATUL, ROMITO ([id number]) [id number] Page 2 of 8 impacted in the wound and throughout the tunneling. We discussed a diverting colostomy to address this issue. She was agreeable with a referral to general surgery. She currently denies systemic signs of infection. She has been using Medihoney and Hydrofera Blue to the left heel wound. She has been using her Prevalon boots. 3/20; patient presents for follow-up. She has been using Dakin's wet-to-dry dressings to the sacral wound. She has developed a new wound to her right  lateral heel. She is using Medihoney and Hydrofera Blue to the left heel. She is  currently taking Levaquin to complete 6 weeks. She follows with infectious disease for this. She saw general surgery at [facility] to discuss potentially doing a diverting colostomy however per patient's surgeon did not recommend this. 4/17; patient presents for follow-up. Has been using Dakin's wet-to-dry dressings to the sacral wound. She has been using Medihoney and Hydrofera Blue to the heel wounds. She states she is using her bunny boots for offloading the heels. She has not heard from plastic surgery for consultation. We gave her the number to call to follow this up. 5/22; patient presents for follow-up. She saw Dr. Ferd Hibbs on 5/8, plastic surgery to discuss potentially doing surgical debridement with muscle flap. Per Dr. Timothy Lasso note Review of the surgery and requirements were discussed with the patient. Patient would like to hold off on proceeding with this option. Wound VAC was recommended and based on the wound today this option seemed reasonable to get started. Patient has home health that will be able to change the wound VAC. She has been using Dakin's wet-to-dry dressings to the sacrum. Patient has a new wound to the left medial ankle. She states she wears bunny boots but does not while in the wheelchair. 6/1; the patient has started the wound VAC as of Monday to the sacral area. Home health is out to change the dressing. She also has wounds on her bilateral ankles and left heel she is using Medihoney and Hydrofera Blue to these areas. She has advanced MS with contractures. Both the patient and her husband say she is eating a high-protein dilated well. She has some form of air mattress but the bed is from sleep number. I am uncertain about the offloading part of this 6/12; patient presents for follow-up. She has had a wound VAC to the sacral wound and home health has been changing this. She has had no issues with  this. She has been using Hydrofera Blue and Medihoney to the bilateral ankle and left heel wounds. The right ankle wound is healed. 6/26; patient presents for follow-up. Has been using a wound VAC to the sacral wound. She has no issues with this. Wound is smaller. She had ABIs on the right that were 1.27 and on the left that was 1.35 with a TBI of 1.18. She had triphasic waveforms throughout. She is been using Hydrofera Blue and Medihoney to the left heel wounds. These have closed. She denies signs of infection. 7/10; patient presents for follow-up. She continues to use a wound VAC to the sacral wound. She has slight skin irritation to the periwound from the drape. Overall wound appears healing. She has no other open wounds to her lower extremities. She currently denies systemic signs of infection. 7/24; patient presents for follow-up. She continues to use the wound VAC to the sacral wound. There is no issues or complaints. 8/7; patient presents for follow-up. She has been using the wound VAC with benefit. The wound is smaller. She is has no issues or complaints today. Home health has been coming out and changing the wound VAC 3x A week. 9/11; patient presents for follow-up. Patient elected to stop the wound VAC 2 weeks ago as it has been a hassle to keep a seal. She has been using Dakin's wet-to-dry dressings. She has no issues or complaints. She denies signs of infection. 9/25; 2-week follow-up for this patient with advanced MS. 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 This is apparently come in quite a bit. They have been using  Dakin's wet-to-dry for quite a period of time now. Her boyfriend changes the dressing. She is careful about offloading this, has a wheelchair cushion etc. 10/9; 2-week follow-up. Substantially improved sacral wound. She has been left with a small wound. I changed her to Prisma last week to see if we can stimulate some granulation to the small remaining  area. It sounds as though she is up in a wheelchair but her friend is careful to manipulate this so she is not putting too much pressure on the lower coccyx area. 10/22; patient with a smaller ulcer on the lower sacrum/coccyx. We have been using silver collagen moistened with hydrogel change every 1 to 2 days. Last time she was here she had protuberant granulation which I knocked back with silver nitrate there is none of this today. 02-17-2023 upon evaluation today patient's wound actually showing signs of having a small pocket fortunately there is no evidence of infection she has made a lot of progress here but this is still very difficult to get the close completely as far as the depth is concerned. Fortunately I do not see any signs of infection at this time. 03-03-2023 upon evaluation today patient appears to be doing well currently in regard to her wound which is showing signs of improvement although it is very slow. Fortunately I do not see any signs of active infection at this time which is good news. No fevers, chills, nausea, vomiting, or diarrhea. 03-15-2023 upon evaluation today patient appears to be doing well currently in regard to her wound the last little bit of this that is remaining open right now is very tiny. There does not appear to be any signs of active infection at this time. No fevers, chills, nausea, vomiting, or diarrhea. 03-31-2023 upon evaluation today patient's wound does seem to be doing well I do not see any signs of need for sharp debridement she actually is healing quite nicely. I do not think that there is any evidence of infection right now which is good news and the wound is smaller is not nearly as deep as what it was this is just very slowly. We have been using endoform. Electronic Signature(s) Signed: 03/31/2023 3:26:39 PM By: Allen Derry PA-C Entered By: Allen Derry on 03/31/2023 12:26:38 Physical Exam  Details -------------------------------------------------------------------------------- [name] ([id number]) [id number] Page 3 of 8 Patient Name: Date of Service: [name], [name] 03/31/2023 2:15 PM Medical Record Number: [id number] Patient Account Number: [id number] Date of Birth/Sex: Treating RN: 1960-05-28 (62 y.o. [name]: Referring Provider: Treating Provider/Extender: Fredrich Birks Weeks in Treatment: 45 Constitutional Well-nourished and well-hydrated in no acute distress. Respiratory normal breathing without difficulty. Psychiatric this patient is able to make decisions and demonstrates good insight into disease process. Alert and Oriented x 3. pleasant and cooperative. Notes Patient's wound bed actually showed signs of good granulation and epithelization at this point. Fortunately I do not see any signs of active infection which is great news and in general I do believe that making excellent headway here towards closure which is excellent as well. Electronic Signature(s) Signed: 03/31/2023 3:26:51 PM By: Allen Derry PA-C Entered By: Allen Derry on 03/31/2023 12:26:51 -------------------------------------------------------------------------------- Physician Orders Details Patient Name: Date of Service: [name], [name] 03/31/2023 2:15 PM Medical Record Number: [id number] Patient Account Number: [id number] Date of Birth/Sex: Treating RN: [birth year] (62 y.o. [name]: Referring  Provider: Treating Provider/Extender: Fredrich Birks Weeks in Treatment: 47 The following information was scribed by: Midge Aver The information was scribed for: Allen Derry Verbal / Phone Orders: No Diagnosis Coding ICD-10 Coding Code Description L89.154 Pressure ulcer  of sacral region, stage 4 I26.99 Other pulmonary embolism without acute cor pulmonale 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, sacral and sacrococcygeal region G35 Multiple sclerosis I82.409 Acute embolism and thrombosis of unspecified deep veins of unspecified lower extremity Z79.01 Long term (current) use of anticoagulants Follow-up Appointments Return Appointment in 2 weeks. Home Health Home Health Company: - [facility] only [facility] for wound care. May utilize formulary equivalent dressing for wound treatment orders unless otherwise specified. Home Health Nurse may visit PRN to address patients wound care needs. - Endoform cut in a small oblong piece and packed into the wound with a skinny cotton tipped applicator Wound Treatment Wound #3 - Sacrum Wound Laterality: Midline, Proximal Cleanser: Vashe 5.8 (oz) Every Other Day/30 Days [name], [name] ([id number]) [id number] Page 4 of 8 Discharge Instructions: Use vashe 5.8 (oz) as directed Prim Dressing: Endoform Natural, Non-fenestrated, 2x2 (in/in) ary Every Other Day/30 Days Secondary Dressing: ABD Pad 5x9 (in/in) Every Other Day/30 Days Discharge Instructions: Cover with ABD pad Secured With: Paper Tape, 0.5x10 (in/yd) Every Other Day/30 Days Electronic Signature(s) Signed: 03/31/2023 3:19:40 PM By: Midge Aver MSN RN CNS WTA Signed: 04/02/2023 12:16:54 PM By: Allen Derry PA-C Entered By: Midge Aver on 03/31/2023 12:19:39 -------------------------------------------------------------------------------- Problem List Details Patient Name: Date of Service: [name], [name] 03/31/2023 2:15 PM Medical Record Number: [id number] Patient Account Number: [id number] Date of Birth/Sex: Treating RN: [birth year] (62 y.o. [name]: Referring Provider: Treating Provider/Extender: Fredrich Birks Weeks  in Treatment: 44 Active Problems ICD-10 Encounter Code Description Active Date MDM Diagnosis L89.154 Pressure ulcer of sacral region, stage 4 05/06/2022 No Yes I26.99 Other pulmonary embolism without acute cor pulmonale 05/06/2022 No Yes 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, sacral and sacrococcygeal region 05/20/2022 No Yes G35 Multiple sclerosis 05/06/2022 No Yes I82.409 Acute embolism and thrombosis of unspecified deep veins of unspecified 05/06/2022 No Yes lower extremity Z79.01 Long term (current) use of anticoagulants 05/06/2022 No Yes Inactive Problems ICD-10 Code Description Active Date Inactive Date L89.153 Pressure ulcer of sacral region, stage 3 05/06/2022 05/06/2022 [name] ([id number]) [id number] Page 5 of 8 Resolved Problems ICD-10 Code Description Active Date Resolved Date L89.620 Pressure ulcer of left heel, unstageable 05/06/2022 05/06/2022 L89.510 Pressure ulcer of right ankle, unstageable 09/02/2022 09/02/2022 L89.523 Pressure ulcer of left ankle, stage 3 09/02/2022 09/02/2022 Electronic Signature(s) Signed: 03/31/2023 2:18:22 PM By: Allen Derry PA-C Entered By: Allen Derry on 03/31/2023 11:18:22 -------------------------------------------------------------------------------- Progress Note Details Patient Name: Date of Service: [name], [name] 03/31/2023 2:15 PM Medical Record Number: [id number] Patient Account Number: [id number] Date of Birth/Sex: Treating RN: August 07, 1960 (62 y.o. [name]: Referring Provider: Treating Provider/Extender: Fredrich Birks Weeks in Treatment: 72 Subjective Chief Complaint Information obtained from Patient 05/06/2022; Bilateral feet wounds and sacral wounds History of Present Illness (HPI) 05/06/2022 Ms. Serynity Dotterweich is a 62 year old female with a past medical history of multiple sclerosis and PEs and DVT  that presents to the clinic for sacral s wounds and Bilateral feet wounds. Patient states that she obtained the sacral ulcers when she was hospitalized in November 2023 for bilateral pulmonary embolisms. She was in the ICU  on a ventilator. Since discharge she has been using Dakin's wet-to-dry dressings to the area. Since her hospitalization she has been bedbound. However she is doing physical therapy. Prior to being hospitalized she was able to pull herself up and transfer from bed to chair on her own. Due to her MS she is not fully mobile. Unfortunately she states that the wound has gotten larger on her sacrum despite Wound care. Patient has home health. On 04/16/2022 she was admitted to the hospital for severe sepsis secondary to acute UTI and sacral cellulitis. She completed 5 days of IV cefepime and vancomycin. She was not discharged with oral antibiotics. She states she developed the bilateral feet wounds at that time. She states she has bunny boots however she is not wearing them. She currently denies systemic signs of infection. 2/7; patient presents for follow-up. She had a bone biopsy done at last clinic visit that showed fragments of inflamed granulation tissue, necrotic material and bone with acute osteomyelitis. Culture grew Proteus mirabilis and Klebsiella pneumoniae. Patient is scheduled to see infectious disease next week. For now she has been doing Dakin's wet-to-dry dressings to the sacrum. She is not able to obtain Santyl and has been keeping the left heel covered. T the right foot where o there was a previous wound with scab however this has healed. This has remained closed. No open wound noted. She states over the past week she has had chills but no fevers, nausea/vomiting or purulent drainage. 2/21; patient presents for follow-up. She saw Dr. Joylene Draft on 2/15. 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 Today she had feces impacted in the wound and throughout  the tunneling. We discussed a diverting colostomy to address this issue. She was agreeable with a referral to general surgery. She currently denies systemic signs of infection. She has been using Medihoney and Hydrofera Blue to the left heel wound. She has been using her Prevalon boots. 3/20; patient presents for follow-up. She has been using Dakin's wet-to-dry dressings to the sacral wound. She has developed a new wound to her right lateral heel. She is using Medihoney and Hydrofera Blue to the left heel. She is currently taking Levaquin to complete 6 weeks. She follows with infectious disease for this. She saw general surgery at [facility] to discuss potentially doing a diverting colostomy however per patient's surgeon did not recommend this. 4/17; patient presents for follow-up. Has been using Dakin's wet-to-dry dressings to the sacral wound. She has been using Medihoney and Hydrofera Blue to the heel wounds. She states she is using her bunny boots for offloading the heels. She has not heard from plastic surgery for consultation. We gave her the number to call to follow this up. [name], [name] ([id number]) [id number] Page 6 of 8 5/22; patient presents for follow-up. She saw Dr. Ferd Hibbs on 5/8, plastic surgery to discuss potentially doing surgical debridement with muscle flap. Per Dr. Timothy Lasso note Review of the surgery and requirements were discussed with the patient. Patient would like to hold off on proceeding with this option. Wound VAC was recommended and based on the wound today this option seemed reasonable to get started. Patient has home health that will be able to change the wound VAC. She has been using Dakin's wet-to-dry dressings to the sacrum. Patient has a new wound to the left medial ankle. She states she wears bunny boots but does not while in the wheelchair. 6/1; the patient has started the wound VAC as of Monday to the sacral  area. Home health is out  to change the dressing. She also has wounds on her bilateral ankles and left heel she is using Medihoney and Hydrofera Blue to these areas. She has advanced MS with contractures. Both the patient and her husband say she is eating a high-protein dilated well. She has some form of air mattress but the bed is from sleep number. I am uncertain about the offloading part of this 6/12; patient presents for follow-up. She has had a wound VAC to the sacral wound and home health has been changing this. She has had no issues with this. She has been using Hydrofera Blue and Medihoney to the bilateral ankle and left heel wounds. The right ankle wound is healed. 6/26; patient presents for follow-up. Has been using a wound VAC to the sacral wound. She has no issues with this. Wound is smaller. She had ABIs on the right that were 1.27 and on the left that was 1.35 with a TBI of 1.18. She had triphasic waveforms throughout. She is been using Hydrofera Blue and Medihoney to the left heel wounds. These have closed. She denies signs of infection. 7/10; patient presents for follow-up. She continues to use a wound VAC to the sacral wound. She has slight skin irritation to the periwound from the drape. Overall wound appears healing. She has no other open wounds to her lower extremities. She currently denies systemic signs of infection. 7/24; patient presents for follow-up. She continues to use the wound VAC to the sacral wound. There is no issues or complaints. 8/7; patient presents for follow-up. She has been using the wound VAC with benefit. The wound is smaller. She is has no issues or complaints today. Home health has been coming out and changing the wound VAC 3x A week. 9/11; patient presents for follow-up. Patient elected to stop the wound VAC 2 weeks ago as it has been a hassle to keep a seal. She has been using Dakin's wet-to-dry dressings. She has no issues or complaints. She denies signs of infection. 9/25;  2-week follow-up for this patient with advanced MS. 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 This is apparently come in quite a bit. They have been using Dakin's wet-to-dry for quite a period of time now. Her boyfriend changes the dressing. She is careful about offloading this, has a wheelchair cushion etc. 10/9; 2-week follow-up. Substantially improved sacral wound. She has been left with a small wound. I changed her to Prisma last week to see if we can stimulate some granulation to the small remaining area. It sounds as though she is up in a wheelchair but her friend is careful to manipulate this so she is not putting too much pressure on the lower coccyx area. 10/22; patient with a smaller ulcer on the lower sacrum/coccyx. We have been using silver collagen moistened with hydrogel change every 1 to 2 days. Last time she was here she had protuberant granulation which I knocked back with silver nitrate there is none of this today. 02-17-2023 upon evaluation today patient's wound actually showing signs of having a small pocket fortunately there is no evidence of infection she has made a lot of progress here but this is still very difficult to get the close completely as far as the depth is concerned. Fortunately I do not see any signs of infection at this time. 03-03-2023 upon evaluation today patient appears to be doing well currently in regard to her wound which is showing signs of  improvement although it is very slow. Fortunately I do not see any signs of active infection at this time which is good news. No fevers, chills, nausea, vomiting, or diarrhea. 03-15-2023 upon evaluation today patient appears to be doing well currently in regard to her wound the last little bit of this that is remaining open right now is very tiny. There does not appear to be any signs of active infection at this time. No fevers, chills, nausea, vomiting, or diarrhea. 03-31-2023 upon evaluation today  patient's wound does seem to be doing well I do not see any signs of need for sharp debridement she actually is healing quite nicely. I do not think that there is any evidence of infection right now which is good news and the wound is smaller is not nearly as deep as what it was this is just very slowly. We have been using endoform. Objective Constitutional Well-nourished and well-hydrated in no acute distress. Vitals Time Taken: 2:35 PM, Temperature: 98.1 F, Pulse: 65 bpm, Respiratory Rate: 16 breaths/min, Blood Pressure: 115/63 mmHg. Respiratory normal breathing without difficulty. Psychiatric this patient is able to make decisions and demonstrates good insight into disease process. Alert and Oriented x 3. pleasant and cooperative. General Notes: Patient's wound bed actually showed signs of good granulation and epithelization at this point. Fortunately I do not see any signs of active infection which is great news and in general I do believe that making excellent headway here towards closure which is excellent as well. Integumentary (Hair, Skin) Wound #3 status is Open. Original cause of wound was Pressure Injury. The date acquired was: 03/09/2022. The wound has been in treatment 47 weeks. The wound is located on the Proximal,Midline Sacrum. The wound measures 0.2cm length x 0.1cm width x 0.8cm depth; 0.016cm^2 area and 0.013cm^3 volume. There is no tunneling or undermining noted. There is a large amount of serosanguineous drainage noted. The wound margin is epibole. There is large (67-100%) red, hyper - granulation within the wound bed. There is a small (1-33%) amount of necrotic tissue within the wound bed. [name], [name] ([id number]) [id number] Page 7 of 8 Assessment Active Problems ICD-10 Pressure ulcer of sacral region, stage 4 Other pulmonary embolism without acute cor pulmonale Osteomyelitis of vertebra, sacral and sacrococcygeal region Multiple  sclerosis Acute embolism and thrombosis of unspecified deep veins of unspecified lower extremity Long term (current) use of anticoagulants Plan Follow-up Appointments: Return Appointment in 2 weeks. Home Health: Home Health Company: - [facility] only [facility] for wound care. May utilize formulary equivalent dressing for wound treatment orders unless otherwise specified. Home Health Nurse may visit PRN to address patients wound care needs. - Endoform cut in a small oblong piece and packed into the wound with a skinny cotton tipped applicator WOUND #3: - Sacrum Wound Laterality: Midline, Proximal Cleanser: Vashe 5.8 (oz) Every Other Day/30 Days Discharge Instructions: Use vashe 5.8 (oz) as directed Prim Dressing: Endoform Natural, Non-fenestrated, 2x2 (in/in) Every Other Day/30 Days ary Secondary Dressing: ABD Pad 5x9 (in/in) Every Other Day/30 Days Discharge Instructions: Cover with ABD pad Secured With: Paper Tape, 0.5x10 (in/yd) Every Other Day/30 Days 1. I would recommend that the patient should continue with the endoform I think this is still probably the best way to go at this point. 2. I am going to recommend the patient should continue with the ABD pad followed followed by the tape to secure in place. We will see patient back for reevaluation in 2 weeks here in  the clinic. If anything worsens or changes patient will contact our office for additional recommendations. Electronic Signature(s) Signed: 03/31/2023 3:29:27 PM By: Allen Derry PA-C Entered By: Allen Derry on 03/31/2023 12:29:27 -------------------------------------------------------------------------------- SuperBill Details Patient Name: Date of Service: [name], [name] 03/31/2023 Medical Record Number: [id number] Patient Account Number: [id number] Date of Birth/Sex: Treating RN: [birth year] (62 y.o. [name]: Referring  Provider: Treating Provider/Extender: Fredrich Birks Weeks in Treatment: 47 Diagnosis Coding ICD-10 Codes Code Description L89.154 Pressure ulcer of sacral region, stage 4 I26.99 Other pulmonary embolism without acute cor pulmonale 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, sacral and sacrococcygeal region G35 Multiple sclerosis I82.409 Acute embolism and thrombosis of unspecified deep veins of unspecified lower extremity [name], [name] ([id number]) [id number] Page 8 of 8 Z79.01 Long term (current) use of anticoagulants Facility Procedures : CPT4 Code: [id number] Description: 99213 - WOUND CARE VISIT-LEV 3 EST PT Modifier: Quantity: 1 Physician Procedures : CPT4 Code Description Modifier [id number] 99213 - WC PHYS LEVEL 3 - EST PT ICD-10 Diagnosis Description L89.154 Pressure ulcer of sacral region, stage 4 I26.99 Other pulmonary embolism without acute cor pulmonale 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, sacral  and sacrococcygeal region G35 Multiple sclerosis Quantity: 1 Electronic Signature(s) Signed: 03/31/2023 3:30:14 PM By: Allen Derry PA-C Previous Signature: 03/31/2023 3:20:42 PM Version By: Midge Aver MSN RN CNS WTA Entered By: Allen Derry on 03/31/2023 12:30:14

## 2023-04-13 ENCOUNTER — Encounter: Payer: Medicare Other | Admitting: Internal Medicine

## 2023-04-13 DIAGNOSIS — L89154 Pressure ulcer of sacral region, stage 4: Secondary | ICD-10-CM | POA: Diagnosis not present

## 2023-04-14 NOTE — Progress Notes (Addendum)
 [name] ([id number]) [id number] Page 1 of 7 Visit Report for 04/13/2023 HPI Details Patient Name: Date of Service: [name] STRA ND, [name] 04/13/2023 3:30 PM Medical Record Number: [id number] Patient Account Number: [id number] Date of Birth/Sex: Treating RN: 01/13/1961 (63 y.o. [name] Primary Care Provider: Sherial Bail Other Clinician: Referring Provider: Treating Provider/Extender: RO BSO N, MICHA EL G Kalisetti, Radhika Weeks in Treatment: 48 History of Present Illness HPI Description: 05/06/2022 [name] is a 63 year old female with a past medical history of multiple sclerosis and PEs and DVT that presents to the clinic for sacral s wounds and Bilateral feet wounds. Patient states that she obtained the sacral ulcers when she was hospitalized in November 2023 for bilateral pulmonary embolisms. She was in the ICU on a ventilator. Since discharge she has been using Dakin's wet-to-dry dressings to the area. Since her hospitalization she has been bedbound. However she is doing physical therapy. Prior to being hospitalized she was able to pull herself up and transfer from bed to chair on her own. Due to her MS she is not fully mobile. Unfortunately she states that the wound has gotten larger on her sacrum despite Wound care. Patient has home health. On 04/16/2022 she was admitted to the hospital for severe sepsis secondary to acute UTI and sacral cellulitis. She completed 5 days of IV cefepime  and vancomycin . She was not discharged with oral antibiotics. She states she developed the bilateral feet wounds at that time. She states she has bunny boots however she is not wearing them. She currently denies systemic signs of infection. 2/7; patient presents for follow-up. She had a bone biopsy done at last clinic visit that showed fragments of inflamed granulation tissue, necrotic material and bone with acute osteomyelitis.  Culture grew Proteus mirabilis and Klebsiella pneumoniae. Patient is scheduled to see infectious disease next week. For now she has been doing Dakin's wet-to-dry dressings to the sacrum. She is not able to obtain Santyl and has been keeping the left heel covered. T the right foot where o there was a previous wound with scab however this has healed. This has remained closed. No open wound noted. She states over the past week she has had chills but no fevers, nausea/vomiting or purulent drainage. 2/21; patient presents for follow-up. She saw Dr. Searcy on 2/15. 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 Today she had feces impacted in the wound and throughout the tunneling. We discussed a diverting colostomy to address this issue. She was agreeable with a referral to general surgery. She currently denies systemic signs of infection. She has been using Medihoney and Hydrofera Blue to the left heel wound. She has been using her Prevalon boots. 3/20; patient presents for follow-up. She has been using Dakin's wet-to-dry dressings to the sacral wound. She has developed a new wound to her right lateral heel. She is using Medihoney and Hydrofera Blue to the left heel. She is currently taking Levaquin  to complete 6 weeks. She follows with infectious disease for this. She saw general surgery at [facility] to discuss potentially doing a diverting colostomy however per patient's surgeon did not recommend this. 4/17; patient presents for follow-up. Has been using Dakin's wet-to-dry dressings to the sacral wound. She has been using Medihoney and Hydrofera Blue to the heel wounds. She states she is using her bunny boots for offloading the heels. She has not heard from plastic surgery for consultation. We gave her the number to call to follow this  up. 5/22; patient presents for follow-up. She saw Dr. Maurita on 5/8, plastic surgery to discuss potentially doing surgical debridement with muscle flap.  Per Dr. Jarrett note Review of the surgery and requirements were discussed with the patient. Patient would like to hold off on proceeding with this option. Wound VAC was recommended and based on the wound today this option seemed reasonable to get started. Patient has home health that will be able to change the wound VAC. She has been using Dakin's wet-to-dry dressings to the sacrum. Patient has a new wound to the left medial ankle. She states she wears bunny boots but does not while in the wheelchair. 6/1; the patient has started the wound VAC as of Monday to the sacral area. Home health is out to change the dressing. She also has wounds on her bilateral ankles and left heel she is using Medihoney and Hydrofera Blue to these areas. She has advanced MS with contractures. Both the patient and her husband say she is eating a high-protein dilated well. She has some form of air mattress but the bed is from sleep number. I am uncertain about the offloading part of this 6/12; patient presents for follow-up. She has had a wound VAC to the sacral wound and home health has been changing this. She has had no issues with this. She has been using Hydrofera Blue and Medihoney to the bilateral ankle and left heel wounds. The right ankle wound is healed. 6/26; patient presents for follow-up. Has been using a wound VAC to the sacral wound. She has no issues with this. Wound is smaller. She had ABIs on the right that were 1.27 and on the left that was 1.35 with a TBI of 1.18. She had triphasic waveforms throughout. She is been using Hydrofera Blue and Medihoney to the left heel wounds. These have closed. She denies signs of infection. 7/10; patient presents for follow-up. She continues to use a wound VAC to the sacral wound. She has slight skin irritation to the periwound from the drape. Overall wound appears healing. She has no other open wounds to her lower extremities. She currently denies systemic signs of  infection. 7/24; patient presents for follow-up. She continues to use the wound VAC to the sacral wound. There is no issues or complaints. 8/7; patient presents for follow-up. She has been using the wound VAC with benefit. The wound is smaller. She is has no issues or complaints today. Home health has been coming out and changing the wound VAC 3x A week. 9/11; patient presents for follow-up. Patient elected to stop the wound VAC 2 weeks ago as it has been a hassle to keep a seal. She has been using Dakin's wet-to-dry dressings. She has no issues or complaints. She denies signs of infection. [name] ([id number]) [id number] Page 2 of 7 9/25; 2-week follow-up for this patient with advanced MS. 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 This is apparently come in quite a bit. They have been using Dakin's wet-to-dry for quite a period of time now. Her boyfriend changes the dressing. She is careful about offloading this, has a wheelchair cushion etc. 10/9; 2-week follow-up. Substantially improved sacral wound. She has been left with a small wound. I changed her to Prisma last week to see if we can stimulate some granulation to the small remaining area. It sounds as though she is up in a wheelchair but her friend is careful to manipulate this so she is not putting too  much pressure on the lower coccyx area. 10/22; patient with a smaller ulcer on the lower sacrum/coccyx. We have been using silver collagen moistened with hydrogel change every 1 to 2 days. Last time she was here she had protuberant granulation which I knocked back with silver nitrate there is none of this today. 02-17-2023 upon evaluation today patient's wound actually showing signs of having a small pocket fortunately there is no evidence of infection she has made a lot of progress here but this is still very difficult to get the close completely as far as the depth is concerned.  Fortunately I do not see any signs of infection at this time. 03-03-2023 upon evaluation today patient appears to be doing well currently in regard to her wound which is showing signs of improvement although it is very slow. Fortunately I do not see any signs of active infection at this time which is good news. No fevers, chills, nausea, vomiting, or diarrhea. 03-15-2023 upon evaluation today patient appears to be doing well currently in regard to her wound the last little bit of this that is remaining open right now is very tiny. There does not appear to be any signs of active infection at this time. No fevers, chills, nausea, vomiting, or diarrhea. 03-31-2023 upon evaluation today patient's wound does seem to be doing well I do not see any signs of need for sharp debridement she actually is healing quite nicely. I do not think that there is any evidence of infection right now which is good news and the wound is smaller is not nearly as deep as what it was this is just very slowly. We have been using endoform. 12/31; this is the remanent of a very sizable stage IV pressure ulcer in this woman with advanced multiple sclerosis. The wound itself is in the right gluteal cleft. A small wound with a very small wound orifice but still over a centimeter of probable depth. We have been using endoform for the last 3 weeks without really any improvement at all prior to that Hydrofera Blue rope. The orifice of the wound is much too small for Hydrofera Blue rope and the endoform which was a good thought really is not making any improvements. Electronic Signature(s) Signed: 04/13/2023 4:49:51 PM By: Rufus Sharper MD Entered By: Rufus Sharper on 04/13/2023 16:37:39 -------------------------------------------------------------------------------- Physical Exam Details Patient Name: Date of Service: [name] DELENA MARYANNE WALDO ND, [name] 04/13/2023 3:30 PM Medical Record Number: [id number] Patient Account Number:  [id number] Date of Birth/Sex: Treating RN: 1960-08-18 (62 y.o. [name] Primary Care Provider: Sherial Bail Other Clinician: Referring Provider: Treating Provider/Extender: RO BSO N, MICHA EL G Kalisetti, Radhika Weeks in Treatment: 48 Constitutional Sitting or standing Blood Pressure is within target range for patient.. Pulse regular and within target range for patient.SABRA Respirations regular, non-labored and within target range.. Temperature is normal and within the target range for the patient.SABRA appears in no distress. Notes Wound exam; below the coccyx on the right side of the gluteal cleft. Small orifice about 1.2 or 3 cm of probable depth this is not probe obviously to bone she is tender around the wound but there is certainly no expressible fluid or purulence. No overt evidence of soft tissue infection Electronic Signature(s) Signed: 04/13/2023 4:49:51 PM By: Rufus Sharper MD Entered By: Rufus Sharper on 04/13/2023 16:39:12 [name] ([id number]) [id number] Page 3 of 7 -------------------------------------------------------------------------------- Physician Orders Details Patient Name: Date of Service: V A NNO STRA ND, [name] 04/13/2023 3:30 PM  Medical Record Number: [id number] Patient Account Number: [id number] Date of Birth/Sex: Treating RN: [birth year] (63 y.o. [name] Primary Care Provider: Sherial Bail Other Clinician: Referring Provider: Treating Provider/Extender: RO BSO N, MICHA EL KANDICE Sherial, Radhika Weeks in Treatment: 92 Verbal / Phone Orders: No Diagnosis Coding ICD-10 Coding Code Description L89.154 Pressure ulcer of sacral region, stage 4 I26.99 Other pulmonary embolism without acute cor pulmonale 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, sacral and sacrococcygeal region G35 Multiple sclerosis I82.409 Acute embolism and thrombosis of unspecified deep veins of unspecified lower extremity Z79.01  Long term (current) use of anticoagulants Follow-up Appointments Return Appointment in 2 weeks. Home Health Home Health Company: - [facility] only Wound Treatment Wound #3 - Sacrum Wound Laterality: Midline, Proximal Cleanser: Vashe 5.8 (oz) Every Other Day/30 Days Discharge Instructions: Use vashe 5.8 (oz) as directed Prim Dressing: Iodoform 1/4x 5 (in/yd) Every Other Day/30 Days ary Discharge Instructions: Apply Iodoform Packing Strip as instructed. Secondary Dressing: (BORDER) Zetuvit Plus SILICONE BORDER Dressing 4x4 (in/in) Every Other Day/30 Days Discharge Instructions: Please do not put silicone bordered dressings under wraps. Use non-bordered dressing only. Electronic Signature(s) Signed: 04/13/2023 4:54:37 PM By: Claudene Blossom MSN RN CNS WTA Signed: 04/16/2023 6:06:14 PM By: Rufus Sharper MD Entered By: Claudene [name] on 04/13/2023 16:54:36 -------------------------------------------------------------------------------- Problem List Details Patient Name: Date of Service: [name] DELENA MARYANNE WALDO ND, [name] 04/13/2023 3:30 PM [name] ([id number]) [id number] Page 4 of 7 Medical Record Number: [id number] Patient Account Number: [id number] Date of Birth/Sex: Treating RN: 1961/03/11 (63 y.o. [name] Primary Care Provider: Sherial Bail Other Clinician: Referring Provider: Treating Provider/Extender: RO BSO N, MICHA EL KANDICE Sherial, Radhika Weeks in Treatment: 48 Active Problems ICD-10 Encounter Code Description Active Date MDM Diagnosis L89.154 Pressure ulcer of sacral region, stage 4 05/06/2022 No Yes I26.99 Other pulmonary embolism without acute cor pulmonale 05/06/2022 No Yes 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, sacral and sacrococcygeal region 05/20/2022 No Yes G35 Multiple sclerosis 05/06/2022 No Yes I82.409 Acute embolism and thrombosis of unspecified deep veins of unspecified 05/06/2022 No Yes lower extremity Z79.01 Long  term (current) use of anticoagulants 05/06/2022 No Yes Inactive Problems ICD-10 Code Description Active Date Inactive Date L89.153 Pressure ulcer of sacral region, stage 3 05/06/2022 05/06/2022 Resolved Problems ICD-10 Code Description Active Date Resolved Date L89.620 Pressure ulcer of left heel, unstageable 05/06/2022 05/06/2022 L89.510 Pressure ulcer of right ankle, unstageable 09/02/2022 09/02/2022 L89.523 Pressure ulcer of left ankle, stage 3 09/02/2022 09/02/2022 Electronic Signature(s) Signed: 04/13/2023 4:49:51 PM By: Rufus Sharper MD Entered By: Rufus Sharper on 04/13/2023 16:36:01 [name] ([id number]) [phone].pdf Page 5 of 7 -------------------------------------------------------------------------------- Progress Note Details Patient Name: Date of Service: [name] DELENA MARYANNE WALDO ND, [name] 04/13/2023 3:30 PM Medical Record Number: [id number] Patient Account Number: [id number] Date of Birth/Sex: Treating RN: Dec 31, 1960 (63 y.o. [name] Primary Care Provider: Sherial Bail Other Clinician: Referring Provider: Treating Provider/Extender: RO BSO N, MICHA EL KANDICE Sherial, Radhika Weeks in Treatment: 48 Subjective History of Present Illness (HPI) 05/06/2022 [name] is a 63 year old female with a past medical history of multiple sclerosis and PEs and DVT that presents to the clinic for sacral s wounds and Bilateral feet wounds. Patient states that she obtained the sacral ulcers when she was hospitalized in November 2023 for bilateral pulmonary embolisms. She was in the ICU on a ventilator. Since discharge she has been using Dakin's wet-to-dry dressings to the area. Since her hospitalization she has been bedbound. However she is doing physical therapy. Prior  to being hospitalized she was able to pull herself up and transfer from bed to chair on her own. Due to her MS she is not fully mobile. Unfortunately she states that  the wound has gotten larger on her sacrum despite Wound care. Patient has home health. On 04/16/2022 she was admitted to the hospital for severe sepsis secondary to acute UTI and sacral cellulitis. She completed 5 days of IV cefepime  and vancomycin . She was not discharged with oral antibiotics. She states she developed the bilateral feet wounds at that time. She states she has bunny boots however she is not wearing them. She currently denies systemic signs of infection. 2/7; patient presents for follow-up. She had a bone biopsy done at last clinic visit that showed fragments of inflamed granulation tissue, necrotic material and bone with acute osteomyelitis. Culture grew Proteus mirabilis and Klebsiella pneumoniae. Patient is scheduled to see infectious disease next week. For now she has been doing Dakin's wet-to-dry dressings to the sacrum. She is not able to obtain Santyl and has been keeping the left heel covered. T the right foot where o there was a previous wound with scab however this has healed. This has remained closed. No open wound noted. She states over the past week she has had chills but no fevers, nausea/vomiting or purulent drainage. 2/21; patient presents for follow-up. She saw Dr. Searcy on 2/15. 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 Today she had feces impacted in the wound and throughout the tunneling. We discussed a diverting colostomy to address this issue. She was agreeable with a referral to general surgery. She currently denies systemic signs of infection. She has been using Medihoney and Hydrofera Blue to the left heel wound. She has been using her Prevalon boots. 3/20; patient presents for follow-up. She has been using Dakin's wet-to-dry dressings to the sacral wound. She has developed a new wound to her right lateral heel. She is using Medihoney and Hydrofera Blue to the left heel. She is currently taking Levaquin  to complete 6 weeks. She follows  with infectious disease for this. She saw general surgery at [facility] to discuss potentially doing a diverting colostomy however per patient's surgeon did not recommend this. 4/17; patient presents for follow-up. Has been using Dakin's wet-to-dry dressings to the sacral wound. She has been using Medihoney and Hydrofera Blue to the heel wounds. She states she is using her bunny boots for offloading the heels. She has not heard from plastic surgery for consultation. We gave her the number to call to follow this up. 5/22; patient presents for follow-up. She saw Dr. Maurita on 5/8, plastic surgery to discuss potentially doing surgical debridement with muscle flap. Per Dr. Jarrett note Review of the surgery and requirements were discussed with the patient. Patient would like to hold off on proceeding with this option. Wound VAC was recommended and based on the wound today this option seemed reasonable to get started. Patient has home health that will be able to change the wound VAC. She has been using Dakin's wet-to-dry dressings to the sacrum. Patient has a new wound to the left medial ankle. She states she wears bunny boots but does not while in the wheelchair. 6/1; the patient has started the wound VAC as of Monday to the sacral area. Home health is out to change the dressing. She also has wounds on her bilateral ankles and left heel she is using Medihoney and Hydrofera Blue to these areas. She has advanced MS with contractures.  Both the patient and her husband say she is eating a high-protein dilated well. She has some form of air mattress but the bed is from sleep number. I am uncertain about the offloading part of this 6/12; patient presents for follow-up. She has had a wound VAC to the sacral wound and home health has been changing this. She has had no issues with this. She has been using Hydrofera Blue and Medihoney to the bilateral ankle and left heel wounds. The right ankle wound is healed. 6/26;  patient presents for follow-up. Has been using a wound VAC to the sacral wound. She has no issues with this. Wound is smaller. She had ABIs on the right that were 1.27 and on the left that was 1.35 with a TBI of 1.18. She had triphasic waveforms throughout. She is been using Hydrofera Blue and Medihoney to the left heel wounds. These have closed. She denies signs of infection. 7/10; patient presents for follow-up. She continues to use a wound VAC to the sacral wound. She has slight skin irritation to the periwound from the drape. Overall wound appears healing. She has no other open wounds to her lower extremities. She currently denies systemic signs of infection. 7/24; patient presents for follow-up. She continues to use the wound VAC to the sacral wound. There is no issues or complaints. 8/7; patient presents for follow-up. She has been using the wound VAC with benefit. The wound is smaller. She is has no issues or complaints today. Home health has been coming out and changing the wound VAC 3x A week. 9/11; patient presents for follow-up. Patient elected to stop the wound VAC 2 weeks ago as it has been a hassle to keep a seal. She has been using Dakin's wet-to-dry dressings. She has no issues or complaints. She denies signs of infection. 9/25; 2-week follow-up for this patient with advanced MS. 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 This is apparently come in quite a bit. They have been using Dakin's wet-to-dry for quite a period of time now. Her boyfriend changes the dressing. She is careful about offloading this, has a wheelchair cushion etc. [name], [name] ([id number]) [id number] Page 6 of 7 10/9; 2-week follow-up. Substantially improved sacral wound. She has been left with a small wound. I changed her to Prisma last week to see if we can stimulate some granulation to the small remaining area. It sounds as though she is up in a wheelchair  but her friend is careful to manipulate this so she is not putting too much pressure on the lower coccyx area. 10/22; patient with a smaller ulcer on the lower sacrum/coccyx. We have been using silver collagen moistened with hydrogel change every 1 to 2 days. Last time she was here she had protuberant granulation which I knocked back with silver nitrate there is none of this today. 02-17-2023 upon evaluation today patient's wound actually showing signs of having a small pocket fortunately there is no evidence of infection she has made a lot of progress here but this is still very difficult to get the close completely as far as the depth is concerned. Fortunately I do not see any signs of infection at this time. 03-03-2023 upon evaluation today patient appears to be doing well currently in regard to her wound which is showing signs of improvement although it is very slow. Fortunately I do not see any signs of active infection at this time which is good news. No fevers, chills, nausea, vomiting,  or diarrhea. 03-15-2023 upon evaluation today patient appears to be doing well currently in regard to her wound the last little bit of this that is remaining open right now is very tiny. There does not appear to be any signs of active infection at this time. No fevers, chills, nausea, vomiting, or diarrhea. 03-31-2023 upon evaluation today patient's wound does seem to be doing well I do not see any signs of need for sharp debridement she actually is healing quite nicely. I do not think that there is any evidence of infection right now which is good news and the wound is smaller is not nearly as deep as what it was this is just very slowly. We have been using endoform. 12/31; this is the remanent of a very sizable stage IV pressure ulcer in this woman with advanced multiple sclerosis. The wound itself is in the right gluteal cleft. A small wound with a very small wound orifice but still over a centimeter of  probable depth. We have been using endoform for the last 3 weeks without really any improvement at all prior to that Hydrofera Blue rope. The orifice of the wound is much too small for Hydrofera Blue rope and the endoform which was a good thought really is not making any improvements. Objective Constitutional Sitting or standing Blood Pressure is within target range for patient.. Pulse regular and within target range for patient.SABRA Respirations regular, non-labored and within target range.. Temperature is normal and within the target range for the patient.SABRA appears in no distress. Vitals Time Taken: 3:53 PM, Temperature: 98.0 F, Pulse: 69 bpm, Respiratory Rate: 18 breaths/min, Blood Pressure: 118/52 mmHg. General Notes: Wound exam; below the coccyx on the right side of the gluteal cleft. Small orifice about 1.2 or 3 cm of probable depth this is not probe obviously to bone she is tender around the wound but there is certainly no expressible fluid or purulence. No overt evidence of soft tissue infection Integumentary (Hair, Skin) Wound #3 status is Open. Original cause of wound was Pressure Injury. The date acquired was: 03/09/2022. The wound has been in treatment 48 weeks. The wound is located on the Proximal,Midline Sacrum. The wound measures 0.1cm length x 0.1cm width x 1.2cm depth; 0.008cm^2 area and 0.009cm^3 volume. There is a large amount of serosanguineous drainage noted. The wound margin is epibole. There is large (67-100%) red, hyper - granulation within the wound bed. There is a small (1-33%) amount of necrotic tissue within the wound bed. 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 Very  difficult set of options. There is not anything that really works well in this wound with very small orifice. Endoform does not seem to be providing any improvement 2. After some thought we elected to use plain packing strip coated in Iodosorb ointment. Will try to tease this into the wound and change every 2 days and as needed as necessary. 3. Iodosorb has the benefit of being antibacterial and sometimes has resulted in tissue adherence in my experience 4. It sounds as though they are religiously offloading this 5. There is nothing really to culture here Electronic Signature(s) Signed: 04/13/2023 4:49:51 PM By: Rufus Sharper MD [name], 04/13/2023 4:49:51 PM By: Rufus Sharper MD Signed: DARLETTA ([id number]) [id number] Page 7 of 7 Entered  By: Rufus Sharper on 04/13/2023 16:40:53 -------------------------------------------------------------------------------- SuperBill Details Patient Name: Date of Service: [name] DELENA MARYANNE WALDO ND, [name] 04/13/2023 Medical Record Number: [id number] Patient Account Number: [id number] Date of Birth/Sex: Treating RN: July 01, 1960 (63 y.o. 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, sacral and sacrococcygeal region G35 Multiple sclerosis I82.409 Acute embolism and thrombosis of unspecified deep veins of unspecified lower extremity Z79.01 Long term (current) use of anticoagulants Facility Procedures : CPT4 Code: 23899826 Description: 99213 - WOUND CARE VISIT-LEV 3 EST PT Modifier: Quantity: 1 Physician Procedures : CPT4 Code Description Modifier [id number] 99213 - WC PHYS LEVEL 3 - EST PT  ICD-10 Diagnosis Description L89.154 Pressure ulcer of sacral region, stage 4 G35 Multiple sclerosis Quantity: 1 Electronic Signature(s) Signed: 04/13/2023 4:55:43 PM By: Claudene Blossom MSN RN CNS WTA Signed: 04/16/2023 6:06:14 PM By: Rufus Sharper MD Previous Signature: 04/13/2023 4:49:51 PM Version By: Rufus Sharper MD Entered By: Claudene [name] on 04/13/2023 16:55:42

## 2023-04-14 NOTE — Progress Notes (Signed)
 [name] ([id number]) [id number] Page 1 of 9 Visit Report for 04/13/2023 Arrival Information Details Patient Name: Date of Service: [name] [name] [name], [name] 04/13/2023 3:30 PM Medical Record Number: [id number] Patient Account Number: [id number] Date of Birth/Sex: Treating RN: May 20, 1960 (63 y.o. [name] [name] Primary Care [name]: [name] Other Clinician: Referring [name]: Treating [name]/Extender: [name], [name] EL [name] [name], [name] in Treatment: 48 Visit Information History Since Last Visit Added or deleted any medications: No Patient Arrived: Wheel Chair Any new allergies or adverse reactions: No Arrival Time: 15:55 Had a fall or experienced change in No Accompanied By: boyfriend activities of daily living that may affect Transfer Assistance: [name] risk of falls: Patient Identification Verified: Yes Signs or symptoms of abuse/neglect since last visito No Secondary Verification Process Completed: Yes Hospitalized since last visit: No Patient Requires Transmission-Based Precautions: No Has Dressing in Place as Prescribed: Yes Patient Has Alerts: Yes Pain Present Now: No Patient Alerts: Patient on Blood Thinner Not Diabetic Xarelto  Electronic Signature(s) Signed: 04/13/2023 5:14:10 PM By: [name] Blossom MSN RN CNS WTA Entered By: [name] on 04/13/2023 15:55:57 -------------------------------------------------------------------------------- Clinic Level of Care Assessment Details Patient Name: Date of Service: [name] [name] [name], [name] 04/13/2023 3:30 PM Medical Record Number: [id number] Patient Account Number: [id number] Date of Birth/Sex: Treating RN: [birth year] (62 y.o. [name] [name] Primary Care [name]: [name] Other Clinician: Referring [name]: Treating [name]/Extender: [name], [name] EL [name] [name], [name] in Treatment: 48 Clinic Level of Care Assessment  Items TOOL 4 Quantity Score X- 1 0 Use when only an EandM is performed on FOLLOW-UP visit ASSESSMENTS - Nursing Assessment / Reassessment X- 1 10 Reassessment of Co-morbidities (includes updates in patient status) X- 1 5 Reassessment of Adherence to Treatment Plan ASSESSMENTS - Wound and Skin A ssessment / Reassessment X - Simple Wound Assessment / Reassessment - one wound 1 5 [name], [name] ([id number]) [id number] Page 2 of 9 []  - 0 Complex Wound Assessment / Reassessment - multiple wounds []  - 0 Dermatologic / Skin Assessment (not related to wound area) ASSESSMENTS - Focused Assessment []  - 0 Circumferential Edema Measurements - multi extremities []  - 0 Nutritional Assessment / Counseling / Intervention []  - 0 Lower Extremity Assessment (monofilament, tuning fork, pulses) []  - 0 Peripheral Arterial Disease Assessment (using hand held doppler) ASSESSMENTS - Ostomy and/or Continence Assessment and Care []  - 0 Incontinence Assessment and Management []  - 0 Ostomy Care Assessment and Management (repouching, etc.) PROCESS - Coordination of Care X - Simple Patient / Family Education for ongoing care 1 15 []  - 0 Complex (extensive) Patient / Family Education for ongoing care X- 1 10 Staff obtains Chiropractor, Records, T Results / Process Orders est []  - 0 Staff telephones HHA, Nursing Homes / Clarify orders / etc []  - 0 Routine Transfer to another Facility (non-emergent condition) []  - 0 Routine Hospital Admission (non-emergent condition) []  - 0 New Admissions / Manufacturing Engineer / Ordering NPWT Apligraf, etc. , []  - 0 Emergency Hospital Admission (emergent condition) X- 1 10 Simple Discharge Coordination []  - 0 Complex (extensive) Discharge Coordination PROCESS - Special Needs []  - 0 Pediatric / Minor Patient Management []  - 0 Isolation Patient Management []  - 0 Hearing / Language / Visual special needs []  - 0 Assessment of  Community assistance (transportation, D/C planning, etc.) []  - 0 Additional assistance / Altered mentation []  - 0 Support Surface(s) Assessment (bed, cushion, seat, etc.) INTERVENTIONS - Wound Cleansing /  Measurement X - Simple Wound Cleansing - one wound 1 5 []  - 0 Complex Wound Cleansing - multiple wounds X- 1 5 Wound Imaging (photographs - any number of wounds) []  - 0 Wound Tracing (instead of photographs) X- 1 5 Simple Wound Measurement - one wound []  - 0 Complex Wound Measurement - multiple wounds INTERVENTIONS - Wound Dressings X - Small Wound Dressing one or multiple wounds 1 10 []  - 0 Medium Wound Dressing one or multiple wounds []  - 0 Large Wound Dressing one or multiple wounds X- 1 5 Application of Medications - topical []  - 0 Application of Medications - injection INTERVENTIONS - Miscellaneous []  - 0 External ear exam []  - 0 Specimen Collection (cultures, biopsies, blood, body fluids, etc.) [name], [name] ([id number]) [id number].pdf Page 3 of 9 []  - 0 Specimen(s) / Culture(s) sent or taken to Lab for analysis X- 1 10 Patient Transfer (multiple staff / [name] / Similar devices) []  - 0 Simple Staple / Suture removal (25 or less) []  - 0 Complex Staple / Suture removal (26 or more) []  - 0 Hypo / Hyperglycemic Management (close monitor of Blood Glucose) []  - 0 Ankle / Brachial Index (ABI) - do not check if billed separately X- 1 5 Vital Signs Has the patient been seen at the hospital within the last three years: Yes Total Score: 100 Level Of Care: New/Established - Level 3 Electronic Signature(s) Signed: 04/13/2023 5:14:10 PM By: [name] Blossom MSN RN CNS WTA Entered By: [name] on 04/13/2023 16:55:32 -------------------------------------------------------------------------------- Encounter Discharge Information Details Patient Name: Date of Service: [name] [name] [name], [name] 04/13/2023 3:30 PM Medical Record  Number: [id number] Patient Account Number: [id number] Date of Birth/Sex: Treating RN: 01-27-1961 (62 y.o. [name] [name] Primary Care Dacari Beckstrand: [name] Other Clinician: Referring Kharon Hixon: Treating Deeksha Cotrell/Extender: [name], [name] EL [name] [name], [name] in Treatment: 48 Encounter Discharge Information Items Discharge Condition: Stable Ambulatory Status: Wheelchair Discharge Destination: Home Transportation: Private Auto Accompanied By: boyfriend Schedule Follow-up Appointment: Yes Clinical Summary of Care: Electronic Signature(s) Signed: 04/13/2023 4:56:43 PM By: [name] Blossom MSN RN CNS WTA Entered By: [name] on 04/13/2023 16:56:43 -------------------------------------------------------------------------------- Lower Extremity Assessment Details Patient Name: Date of Service: [name] [name] [name], [name] 04/13/2023 3:30 PM Medical Record Number: [id number] Patient Account Number: [id number] Date of Birth/Sex: Treating RN: 1960/05/02 (62 y.o. Deavion, Strider, Domique ([id number]) [id number] Page 4 of 9 Primary Care Shalon Councilman: [name] Other Clinician: Referring Kannon Baum: Treating Anselma Herbel/Extender: [name], [name] EL [name] [name], [name] in Treatment: 48 Electronic Signature(s) Signed: 04/13/2023 5:14:10 PM By: [name] Blossom MSN RN CNS WTA Entered By: [name] on 04/13/2023 15:58:24 -------------------------------------------------------------------------------- Multi Wound Chart Details Patient Name: Date of Service: [name] [name] [name], [name] 04/13/2023 3:30 PM Medical Record Number: [id number] Patient Account Number: [id number] Date of Birth/Sex: Treating RN: September 15, 1960 (62 y.o. [name] [name] Primary Care Chari Parmenter: [name] Other Clinician: Referring Hommer Cunliffe: Treating Leafy Motsinger/Extender: [name], [name] EL [name] [name], [name] in Treatment: 48 Vital  Signs Height(in): Pulse(bpm): 69 Weight(lbs): Blood Pressure(mmHg): 118/52 Body Mass Index(BMI): Temperature(F): 98.0 Respiratory Rate(breaths/min): 18 [3:Photos:] [[name]/A:[name]/A] Proximal, Midline Sacrum [name]/A [name]/A Wound Location: Pressure Injury [name]/A [name]/A Wounding Event: Pressure Ulcer [name]/A [name]/A Primary Etiology: Angina, Hypotension [name]/A [name]/A Comorbid History: 03/09/2022 [name]/A [name]/A Date Acquired: 6 [name]/A [name]/A [name] of Treatment: Open [name]/A [name]/A Wound Status: No [name]/A [name]/A Wound Recurrence: 0.1x0.1x1.2 [name]/A [name]/A Measurements L x W x D (cm) 0.008 [name]/A  [name]/A A (cm) : rea 0.009 [name]/A [name]/A Volume (cm) : 100.00% [name]/A [name]/A % Reduction in A rea: 100.00% [name]/A [name]/A % 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 [name]/A [name]/A Classification: Large [name]/A [name]/A Exudate A mount: Serosanguineous [name]/A [name]/A Exudate Type: red, brown [name]/A [name]/A Exudate Color: Epibole [name]/A [name]/A Wound Margin: Large (67-100%) [name]/A [name]/A Granulation A mount: Red, Hyper-granulation [name]/A [name]/A Granulation Quality: Small (1-33%) [name]/A [name]/A Necrotic A mount: Fascia: No [name]/A [name]/A Exposed Structures: Fat Layer (Subcutaneous Tissue): No Tendon: No Muscle: No Joint: No Bone: No Medium (34-66%) [name]/A [name]/A EpithelializationBETHA [name] ([id number]) [id number] Page 5 of 9 Treatment Notes Electronic Signature(s) Signed: 04/13/2023 5:14:10 PM By: [name] Blossom MSN RN CNS WTA Entered By: [name] on 04/13/2023 15:58:29 -------------------------------------------------------------------------------- Multi-Disciplinary Care Plan Details Patient Name: Date of Service: [name] [name] [name], [name] 04/13/2023 3:30 PM Medical Record Number: [id number] Patient Account Number: [id number] Date of Birth/Sex: Treating RN: Aug 20, 1960 (62 y.o. [name] [name] Primary Care Arnie Clingenpeel: [name] Other Clinician: Referring Kenady Doxtater: Treating Gad Aymond/Extender: [name], [name] EL [name] [name], [name] in Treatment: 48 Active  Inactive Wound/Skin Impairment Nursing Diagnoses: Impaired tissue integrity Knowledge deficit related to ulceration/compromised skin integrity Goals: Patient/caregiver will verbalize understanding of skin care regimen Date Initiated: 05/06/2022 Date Inactivated: 07/29/2022 Target Resolution Date: 08/28/2022 Goal Status: Met Ulcer/skin breakdown will have a volume reduction of 30% by week 4 Date Initiated: 05/06/2022 Date Inactivated: 07/29/2022 Target Resolution Date: 06/06/2022 Goal Status: Unmet Unmet Reason: not 30% decrease Ulcer/skin breakdown will have a volume reduction of 50% by week 8 Date Initiated: 05/20/2022 Date Inactivated: 07/29/2022 Target Resolution Date: 07/05/2022 Unmet Reason: not 50% decrease in Goal Status: Unmet size Ulcer/skin breakdown will have a volume reduction of 80% by week 12 Date Initiated: 05/20/2022 Date Inactivated: 07/29/2022 Target Resolution Date: 08/05/2022 Unmet Reason: not 80% decrease in Goal Status: Unmet size Ulcer/skin breakdown will heal within 14 [name] Date Initiated: 05/20/2022 Target Resolution Date: 04/10/2023 Goal Status: Active Interventions: Assess patient/caregiver ability to perform ulcer/skin care regimen upon admission and as needed Assess ulceration(s) every visit Provide education on ulcer and skin care Screen for HBO Treatment Activities: Skin care regimen initiated : 05/06/2022 Topical wound management initiated : 05/06/2022 Notes: Electronic Signature(s) Signed: 04/13/2023 4:55:49 PM By: [name] Blossom MSN RN CNS WTA Entered By: [name] on 04/13/2023 16:55:49 [name] ([id number]) [id number] Page 6 of 9 -------------------------------------------------------------------------------- Pain Assessment Details Patient Name: Date of Service: [name] [name] [name], [name] 04/13/2023 3:30 PM Medical Record Number: [id number] Patient Account Number: [id number] Date of Birth/Sex: Treating  RN: 04-13-1961 (63 y.o. [name] [name] Primary Care Siani Utke: [name] Other Clinician: Referring Latriece Anstine: Treating Hawley Michel/Extender: [name], [name] EL [name] [name], [name] in Treatment: 48 Active Problems Location of Pain Severity and Description of Pain Patient Has Paino No Site Locations Pain Management and Medication Current Pain Management: Electronic Signature(s) Signed: 04/13/2023 5:14:10 PM By: [name] Blossom MSN RN CNS WTA Entered By: [name] on 04/13/2023 15:56:29 -------------------------------------------------------------------------------- Patient/Caregiver Education Details Patient Name: Date of Service: [name] [name] [name], [name] 12/31/2024andnbsp3:30 PM Medical Record Number: [id number] Patient Account Number: [id number] Date of Birth/Gender: Treating RN: [birth year] (64 y.o. [name] [name] Primary Care Physician: [name] Other Clinician: Referring Physician: Treating Physician/Extender: [id number] [place] ([id number]) [id number] Page 7 of 9 [name] in Treatment: 48 Education Assessment Education Provided To: Patient Education Topics Provided Wound/Skin Impairment: Handouts: Caring for Your Ulcer Methods:  Explain/Verbal Responses: State content correctly Electronic Signature(s) Signed: 04/13/2023 5:14:10 PM By: [name] Blossom MSN RN CNS WTA Entered By: [name] on 04/13/2023 16:56:02 -------------------------------------------------------------------------------- Wound Assessment Details Patient Name: Date of Service: [name] [name] [name], [name] 04/13/2023 3:30 PM Medical Record Number: [id number] Patient Account Number: [id number] Date of Birth/Sex: Treating RN: 08/01/1960 (62 y.o. [name] [name] Primary Care Haruki Arnold: [name] Other Clinician: Referring Sallie Staron: Treating Georgeana Oertel/Extender: [name], [name] EL [name] [name], [name] in  Treatment: 48 Wound Status Wound Number: 3 Primary Etiology: Pressure Ulcer Wound Location: Proximal, Midline Sacrum Wound Status: Open Wounding Event: Pressure Injury Comorbid History: Angina, Hypotension Date Acquired: 03/09/2022 [name] Of Treatment: 48 Clustered Wound: No Photos Wound Measurements Length: (cm) 0.1 Width: (cm) 0.1 Depth: (cm) 1.2 Area: (cm) 0.008 Volume: (cm) 0.009 % Reduction in Area: 100% % Reduction in Volume: 100% Epithelialization: Medium (34-66%) Wound Description Classification: Category/Stage IV Beg, Ronnett ([id number]) Wound Margin: Epibole Exudate Amount: Large Exudate Type: Serosanguineous Exudate Color: red, brown Foul Odor After Cleansing: No [id number] Page 8 of 9 Slough/Fibrino No Wound Bed Granulation Amount: Large (67-100%) Exposed Structure Granulation Quality: Red, Hyper-granulation Fascia Exposed: No Necrotic Amount: Small (1-33%) Fat Layer (Subcutaneous Tissue) Exposed: No Tendon Exposed: No Muscle Exposed: No Joint Exposed: No Bone Exposed: No Treatment Notes Wound #3 (Sacrum) Wound Laterality: Midline, Proximal Cleanser Vashe 5.8 (oz) Discharge Instruction: Use vashe 5.8 (oz) as directed Peri-Wound Care Topical Primary Dressing Iodoform 1/4x 5 (in/yd) Discharge Instruction: Apply Iodoform Packing Strip as instructed. Secondary Dressing (BORDER) Zetuvit Plus SILICONE BORDER Dressing 4x4 (in/in) Discharge Instruction: Please do not put silicone bordered dressings under wraps. Use non-bordered dressing only. Secured With Compression Wrap Compression Stockings Facilities Manager) Signed: 04/13/2023 5:14:10 PM By: [name] Blossom MSN RN CNS WTA Entered By: [name] on 04/13/2023 15:57:09 -------------------------------------------------------------------------------- Vitals Details Patient Name: Date of Service: [name] [name] [name], [name] 04/13/2023 3:30 PM Medical  Record Number: [id number] Patient Account Number: [id number] Date of Birth/Sex: Treating RN: Mar 31, 1961 (62 y.o. [name] [name] Primary Care Charelle Petrakis: [name] Other Clinician: Referring Jahree Dermody: Treating Raijon Lindfors/Extender: [name], [name] EL [name] [name], [name] in Treatment: 48 Vital Signs Time Taken: 15:53 Temperature (F): 98.0 Pulse (bpm): 69 Respiratory Rate (breaths/min): 18 Blood Pressure (mmHg): 118/52 Reference Range: 80 - 120 mg / dl ORIYAH, LAMPHEAR ([id number]) [phone].pdf Page 9 of 9 Electronic Signature(s) Signed: 04/13/2023 5:14:10 PM By: [name] Blossom MSN RN CNS WTA Entered By: [name] on 04/13/2023 15:56:22

## 2023-04-27 ENCOUNTER — Telehealth: Payer: Self-pay

## 2023-04-27 NOTE — Telephone Encounter (Signed)
 Pt assistance form for xeomin faxed to pt assistance

## 2023-04-27 NOTE — Telephone Encounter (Signed)
 Late entry, auth not required for Medicare A+B. Auth not required for [facility] reference #: [id number]. Pt will be buy/bill.

## 2023-04-28 ENCOUNTER — Encounter: Payer: Medicare Other | Attending: Physician Assistant | Admitting: Physician Assistant

## 2023-04-28 DIAGNOSIS — Z86711 Personal history of pulmonary embolism: Secondary | ICD-10-CM | POA: Diagnosis not present

## 2023-04-28 DIAGNOSIS — Z7901 Long term (current) use of anticoagulants: Secondary | ICD-10-CM | POA: Diagnosis not present

## 2023-04-28 DIAGNOSIS — Z86718 Personal history of other venous thrombosis and embolism: Secondary | ICD-10-CM | POA: Insufficient documentation

## 2023-04-28 DIAGNOSIS — G35 Multiple sclerosis: Secondary | ICD-10-CM | POA: Insufficient documentation

## 2023-04-28 DIAGNOSIS — L89154 Pressure ulcer of sacral region, stage 4: Secondary | ICD-10-CM | POA: Diagnosis present

## 2023-04-29 NOTE — Progress Notes (Addendum)
LOSSIE, HASEMAN ([id number]) [id number] Page 1 of 8 Visit Report for 04/28/2023 Chief Complaint Document Details Patient Name: Date of Service: [name] STRA ND, [name] 04/28/2023 2:15 PM Medical Record Number: [id number] Patient Account Number: [id number] Date of Birth/Sex: Treating RN: Apr 21, 1960 (63 y.o. [name], [name] Primary Care Provider: Enid Baas Other Clinician: Referring Provider: Treating Provider/Extender: Fredrich Birks Weeks in Treatment: 25 Information Obtained from: Patient Chief Complaint 05/06/2022; Bilateral feet wounds and sacral wounds Electronic Signature(s) Signed: 04/28/2023 2:23:48 PM By: Allen Derry PA-C Entered By: Allen Derry on 04/28/2023 14:23:48 -------------------------------------------------------------------------------- HPI Details Patient Name: Date of Service: [name] ND, [name] 04/28/2023 2:15 PM Medical Record Number: [id number] Patient Account Number: [id number] Date of Birth/Sex: Treating RN: [birth year] (62 y.o. [name], [name] Primary Care Provider: Enid Baas Other Clinician: Referring Provider: Treating Provider/Extender: Fredrich Birks Weeks in Treatment: 43 History of Present Illness HPI Description: 05/06/2022 [name] is a 63 year old female with a past medical history of multiple sclerosis and PEs and DVT that presents to the clinic for sacral s wounds and Bilateral feet wounds. Patient states that she obtained the sacral ulcers when she was hospitalized in November 2023 for bilateral pulmonary embolisms. She was in the ICU on a ventilator. Since discharge she has been using Dakin's wet-to-dry dressings to the area. Since her hospitalization she has been bedbound. However she is doing physical therapy. Prior to being hospitalized she was able to pull herself up and transfer from bed to chair on her own. Due to her MS she is  not fully mobile. Unfortunately she states that the wound has gotten larger on her sacrum despite Wound care. Patient has home health. On 04/16/2022 she was admitted to the hospital for severe sepsis secondary to acute UTI and sacral cellulitis. She completed 5 days of IV cefepime and vancomycin. She was not discharged with oral antibiotics. She states she developed the bilateral feet wounds at that time. She states she has bunny boots however she is not wearing them. She currently denies systemic signs of infection. 2/7; patient presents for follow-up. She had a bone biopsy done at last clinic visit that showed fragments of inflamed granulation tissue, necrotic material and bone with acute osteomyelitis. Culture grew Proteus mirabilis and Klebsiella pneumoniae. Patient is scheduled to see infectious disease next week. For now she has been doing Dakin's wet-to-dry dressings to the sacrum. She is not able to obtain Santyl and has been keeping the left heel covered. T the right foot where o there was a previous wound with scab however this has healed. This has remained closed. No open wound noted. She states over the past week she has had chills but no fevers, nausea/vomiting or purulent drainage. 2/21; patient presents for follow-up. She saw Dr. Joylene Draft on 2/15. 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 Today she had feces [place], [name] ([id number]) [id number] Page 2 of 8 impacted in the wound and throughout the tunneling. We discussed a diverting colostomy to address this issue. She was agreeable with a referral to general surgery. She currently denies systemic signs of infection. She has been using Medihoney and Hydrofera Blue to the left heel wound. She has been using her Prevalon boots. 3/20; patient presents for follow-up. She has been using Dakin's wet-to-dry dressings to the sacral wound. She has developed a new wound to her right  lateral heel. She is using Medihoney and Hydrofera Blue to the left heel. She is  currently taking Levaquin to complete 6 weeks. She follows with infectious disease for this. She saw general surgery at [facility] to discuss potentially doing a diverting colostomy however per patient's surgeon did not recommend this. 4/17; patient presents for follow-up. Has been using Dakin's wet-to-dry dressings to the sacral wound. She has been using Medihoney and Hydrofera Blue to the heel wounds. She states she is using her bunny boots for offloading the heels. She has not heard from plastic surgery for consultation. We gave her the number to call to follow this up. 5/22; patient presents for follow-up. She saw Dr. Ferd Hibbs on 5/8, plastic surgery to discuss potentially doing surgical debridement with muscle flap. Per Dr. Timothy Lasso note Review of the surgery and requirements were discussed with the patient. Patient would like to hold off on proceeding with this option. Wound VAC was recommended and based on the wound today this option seemed reasonable to get started. Patient has home health that will be able to change the wound VAC. She has been using Dakin's wet-to-dry dressings to the sacrum. Patient has a new wound to the left medial ankle. She states she wears bunny boots but does not while in the wheelchair. 6/1; the patient has started the wound VAC as of Monday to the sacral area. Home health is out to change the dressing. She also has wounds on her bilateral ankles and left heel she is using Medihoney and Hydrofera Blue to these areas. She has advanced MS with contractures. Both the patient and her husband say she is eating a high-protein dilated well. She has some form of air mattress but the bed is from sleep number. I am uncertain about the offloading part of this 6/12; patient presents for follow-up. She has had a wound VAC to the sacral wound and home health has been changing this. She has had no issues with  this. She has been using Hydrofera Blue and Medihoney to the bilateral ankle and left heel wounds. The right ankle wound is healed. 6/26; patient presents for follow-up. Has been using a wound VAC to the sacral wound. She has no issues with this. Wound is smaller. She had ABIs on the right that were 1.27 and on the left that was 1.35 with a TBI of 1.18. She had triphasic waveforms throughout. She is been using Hydrofera Blue and Medihoney to the left heel wounds. These have closed. She denies signs of infection. 7/10; patient presents for follow-up. She continues to use a wound VAC to the sacral wound. She has slight skin irritation to the periwound from the drape. Overall wound appears healing. She has no other open wounds to her lower extremities. She currently denies systemic signs of infection. 7/24; patient presents for follow-up. She continues to use the wound VAC to the sacral wound. There is no issues or complaints. 8/7; patient presents for follow-up. She has been using the wound VAC with benefit. The wound is smaller. She is has no issues or complaints today. Home health has been coming out and changing the wound VAC 3x A week. 9/11; patient presents for follow-up. Patient elected to stop the wound VAC 2 weeks ago as it has been a hassle to keep a seal. She has been using Dakin's wet-to-dry dressings. She has no issues or complaints. She denies signs of infection. 9/25; 2-week follow-up for this patient with advanced MS. 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 This is apparently come in quite a bit. They have been using  Dakin's wet-to-dry for quite a period of time now. Her boyfriend changes the dressing. She is careful about offloading this, has a wheelchair cushion etc. 10/9; 2-week follow-up. Substantially improved sacral wound. She has been left with a small wound. I changed her to Prisma last week to see if we can stimulate some granulation to the small remaining  area. It sounds as though she is up in a wheelchair but her friend is careful to manipulate this so she is not putting too much pressure on the lower coccyx area. 10/22; patient with a smaller ulcer on the lower sacrum/coccyx. We have been using silver collagen moistened with hydrogel change every 1 to 2 days. Last time she was here she had protuberant granulation which I knocked back with silver nitrate there is none of this today. 02-17-2023 upon evaluation today patient's wound actually showing signs of having a small pocket fortunately there is no evidence of infection she has made a lot of progress here but this is still very difficult to get the close completely as far as the depth is concerned. Fortunately I do not see any signs of infection at this time. 03-03-2023 upon evaluation today patient appears to be doing well currently in regard to her wound which is showing signs of improvement although it is very slow. Fortunately I do not see any signs of active infection at this time which is good news. No fevers, chills, nausea, vomiting, or diarrhea. 03-15-2023 upon evaluation today patient appears to be doing well currently in regard to her wound the last little bit of this that is remaining open right now is very tiny. There does not appear to be any signs of active infection at this time. No fevers, chills, nausea, vomiting, or diarrhea. 03-31-2023 upon evaluation today patient's wound does seem to be doing well I do not see any signs of need for sharp debridement she actually is healing quite nicely. I do not think that there is any evidence of infection right now which is good news and the wound is smaller is not nearly as deep as what it was this is just very slowly. We have been using endoform. 12/31; this is the remanent of a very sizable stage IV pressure ulcer in this woman with advanced multiple sclerosis. The wound itself is in the right gluteal cleft. A small wound with a very small  wound orifice but still over a centimeter of probable depth. We have been using endoform for the last 3 weeks without really any improvement at all prior to that [facility] Blue rope. The orifice of the wound is much too small for Hydrofera Blue rope and the endoform which was a good thought really is not making any improvements. 04-28-23 based on what I am seeing at this point patient appears to be doing well currently in regard to her wound. She has been tolerating the dressing changes without complication. Fortunately I do not see any evidence of active infection which is great news and overall very pleased with where things stand currently. Electronic Signature(s) Signed: 04/28/2023 3:10:35 PM By: Allen Derry PA-C Entered By: Allen Derry on 04/28/2023 15:10:35 [name] ([id number]) [id number] Page 3 of 8 -------------------------------------------------------------------------------- Physical Exam Details Patient Name: Date of Service: [name] ND, [name] 04/28/2023 2:15 PM Medical Record Number: [id number] Patient Account Number: [id number] Date of Birth/Sex: Treating RN: [birth year] (63 y.o. [name], [name] Primary Care Provider: Enid Baas Other Clinician: Referring Provider: Treating Provider/Extender: Dorthula Nettles, [place] Tania Ade  in Treatment: 51 Constitutional Well-nourished and well-hydrated in no acute distress. Respiratory normal breathing without difficulty. Psychiatric this patient is able to make decisions and demonstrates good insight into disease process. Alert and Oriented x 3. pleasant and cooperative. Notes Patient's wound bed actually appears to be completely healed based on what I am seeing and I do not see any evidence of active infection at this time which is great news. No fevers, chills, nausea, vomiting, or diarrhea. Electronic Signature(s) Signed: 04/28/2023 3:10:55 PM By: Allen Derry PA-C Entered  By: Allen Derry on 04/28/2023 15:10:55 -------------------------------------------------------------------------------- Physician Orders Details Patient Name: Date of Service: [name] ND, [name] 04/28/2023 2:15 PM Medical Record Number: [id number] Patient Account Number: [id number] Date of Birth/Sex: Treating RN: 08-15-1960 (62 y.o. [name], [name] Primary Care Provider: Enid Baas Other Clinician: Referring Provider: Treating Provider/Extender: Fredrich Birks Weeks in Treatment: 57 Verbal / Phone Orders: No Diagnosis Coding ICD-10 Coding Code Description L89.154 Pressure ulcer of sacral region, stage 4 I26.99 Other pulmonary embolism without acute cor pulmonale 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, sacral and sacrococcygeal region G35 Multiple sclerosis I82.409 Acute embolism and thrombosis of unspecified deep veins of unspecified lower extremity Z79.01 Long term (current) use of anticoagulants [name], [name] ([id number]) [phone].pdf Page 4 of 8 Discharge From [facility] Discharge from [facility] Treatment Complete Wound Treatment Electronic Signature(s) Signed: 04/28/2023 4:06:08 PM By: Bonnell Public Signed: 04/29/2023 6:05:15 PM By: Allen Derry PA-C Entered By: Bonnell Public on 04/28/2023 15:12:19 -------------------------------------------------------------------------------- Problem List Details Patient Name: Date of Service: [name] ND, [name] 04/28/2023 2:15 PM Medical Record Number: [id number] Patient Account Number: [id number] Date of Birth/Sex: Treating RN: Jan 19, 1961 (62 y.o. [name], [name] Primary Care Provider: Enid Baas Other Clinician: Referring Provider: Treating Provider/Extender: Fredrich Birks Weeks in Treatment: 8 Active Problems ICD-10 Encounter Code Description Active Date MDM Diagnosis L89.154 Pressure ulcer of sacral region, stage 4 05/06/2022  No Yes I26.99 Other pulmonary embolism without acute cor pulmonale 05/06/2022 No Yes 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, sacral and sacrococcygeal region 05/20/2022 No Yes G35 Multiple sclerosis 05/06/2022 No Yes I82.409 Acute embolism and thrombosis of unspecified deep veins of unspecified 05/06/2022 No Yes lower extremity Z79.01 Long term (current) use of anticoagulants 05/06/2022 No Yes Inactive Problems ICD-10 Code Description Active Date Inactive Date L89.153 Pressure ulcer of sacral region, stage 3 05/06/2022 05/06/2022 Resolved Problems SRUSHTI, BERMAN ([id number]) [phone].pdf Page 5 of 8 ICD-10 Code Description Active Date Resolved Date L89.620 Pressure ulcer of left heel, unstageable 05/06/2022 05/06/2022 L89.510 Pressure ulcer of right ankle, unstageable 09/02/2022 09/02/2022 L89.523 Pressure ulcer of left ankle, stage 3 09/02/2022 09/02/2022 Electronic Signature(s) Signed: 04/28/2023 2:23:42 PM By: Allen Derry PA-C Entered By: Allen Derry on 04/28/2023 14:23:42 -------------------------------------------------------------------------------- Progress Note Details Patient Name: Date of Service: [name] ND, [name] 04/28/2023 2:15 PM Medical Record Number: [id number] Patient Account Number: [id number] Date of Birth/Sex: Treating RN: Mar 18, 1961 (63 y.o. [name], [name] Primary Care Provider: Enid Baas Other Clinician: Referring Provider: Treating Provider/Extender: Fredrich Birks Weeks in Treatment: 11 Subjective Chief Complaint Information obtained from Patient 05/06/2022; Bilateral feet wounds and sacral wounds History of Present Illness (HPI) 05/06/2022 [name] is a 63 year old female with a past medical history of multiple sclerosis and PEs and DVT that presents to the clinic for sacral s wounds and Bilateral feet wounds. Patient states that she obtained the sacral ulcers when she was hospitalized in  November 2023 for bilateral pulmonary embolisms.  She was in the ICU on a ventilator. Since discharge she has been using Dakin's wet-to-dry dressings to the area. Since her hospitalization she has been bedbound. However she is doing physical therapy. Prior to being hospitalized she was able to pull herself up and transfer from bed to chair on her own. Due to her MS she is not fully mobile. Unfortunately she states that the wound has gotten larger on her sacrum despite Wound care. Patient has home health. On 04/16/2022 she was admitted to the hospital for severe sepsis secondary to acute UTI and sacral cellulitis. She completed 5 days of IV cefepime and vancomycin. She was not discharged with oral antibiotics. She states she developed the bilateral feet wounds at that time. She states she has bunny boots however she is not wearing them. She currently denies systemic signs of infection. 2/7; patient presents for follow-up. She had a bone biopsy done at last clinic visit that showed fragments of inflamed granulation tissue, necrotic material and bone with acute osteomyelitis. Culture grew Proteus mirabilis and Klebsiella pneumoniae. Patient is scheduled to see infectious disease next week. For now she has been doing Dakin's wet-to-dry dressings to the sacrum. She is not able to obtain Santyl and has been keeping the left heel covered. T the right foot where o there was a previous wound with scab however this has healed. This has remained closed. No open wound noted. She states over the past week she has had chills but no fevers, nausea/vomiting or purulent drainage. 2/21; patient presents for follow-up. She saw Dr. Joylene Draft on 2/15. 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 Today she had feces impacted in the wound and throughout the tunneling. We discussed a diverting colostomy to address this issue. She was agreeable with a referral to general surgery. She currently denies systemic  signs of infection. She has been using Medihoney and Hydrofera Blue to the left heel wound. She has been using her Prevalon boots. 3/20; patient presents for follow-up. She has been using Dakin's wet-to-dry dressings to the sacral wound. She has developed a new wound to her right lateral heel. She is using Medihoney and Hydrofera Blue to the left heel. She is currently taking Levaquin to complete 6 weeks. She follows with infectious disease for this. She saw general surgery at [facility] to discuss potentially doing a diverting colostomy however per patient's surgeon did not recommend this. 4/17; patient presents for follow-up. Has been using Dakin's wet-to-dry dressings to the sacral wound. She has been using Medihoney and Hydrofera Blue to the heel wounds. She states she is using her bunny boots for offloading the heels. She has not heard from plastic surgery for consultation. We gave her the number to call to follow this up. 5/22; patient presents for follow-up. She saw Dr. Ferd Hibbs on 5/8, plastic surgery to discuss potentially doing surgical debridement with muscle flap. Per Dr. Timothy Lasso note Review of the surgery and requirements were discussed with the patient. Patient would like to hold off on proceeding with this option. Wound VAC was recommended and based on the wound today this option seemed reasonable to get started. Patient has home health that will be able to change the wound VAC. She has been using Dakin's wet-to-dry dressings to the sacrum. Patient has a new wound to the left medial ankle. She states she wears bunny boots but does not while in the wheelchair. [name], [name] ([id number]) [id number] Page 6 of 8 6/1; the patient has started the wound VAC as  of Monday to the sacral area. Home health is out to change the dressing. She also has wounds on her bilateral ankles and left heel she is using Medihoney and Hydrofera Blue to these areas. She has  advanced MS with contractures. Both the patient and her husband say she is eating a high-protein dilated well. She has some form of air mattress but the bed is from sleep number. I am uncertain about the offloading part of this 6/12; patient presents for follow-up. She has had a wound VAC to the sacral wound and home health has been changing this. She has had no issues with this. She has been using Hydrofera Blue and Medihoney to the bilateral ankle and left heel wounds. The right ankle wound is healed. 6/26; patient presents for follow-up. Has been using a wound VAC to the sacral wound. She has no issues with this. Wound is smaller. She had ABIs on the right that were 1.27 and on the left that was 1.35 with a TBI of 1.18. She had triphasic waveforms throughout. She is been using Hydrofera Blue and Medihoney to the left heel wounds. These have closed. She denies signs of infection. 7/10; patient presents for follow-up. She continues to use a wound VAC to the sacral wound. She has slight skin irritation to the periwound from the drape. Overall wound appears healing. She has no other open wounds to her lower extremities. She currently denies systemic signs of infection. 7/24; patient presents for follow-up. She continues to use the wound VAC to the sacral wound. There is no issues or complaints. 8/7; patient presents for follow-up. She has been using the wound VAC with benefit. The wound is smaller. She is has no issues or complaints today. Home health has been coming out and changing the wound VAC 3x A week. 9/11; patient presents for follow-up. Patient elected to stop the wound VAC 2 weeks ago as it has been a hassle to keep a seal. She has been using Dakin's wet-to-dry dressings. She has no issues or complaints. She denies signs of infection. 9/25; 2-week follow-up for this patient with advanced MS. 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 This is apparently come in quite a bit.  They have been using Dakin's wet-to-dry for quite a period of time now. Her boyfriend changes the dressing. She is careful about offloading this, has a wheelchair cushion etc. 10/9; 2-week follow-up. Substantially improved sacral wound. She has been left with a small wound. I changed her to Prisma last week to see if we can stimulate some granulation to the small remaining area. It sounds as though she is up in a wheelchair but her friend is careful to manipulate this so she is not putting too much pressure on the lower coccyx area. 10/22; patient with a smaller ulcer on the lower sacrum/coccyx. We have been using silver collagen moistened with hydrogel change every 1 to 2 days. Last time she was here she had protuberant granulation which I knocked back with silver nitrate there is none of this today. 02-17-2023 upon evaluation today patient's wound actually showing signs of having a small pocket fortunately there is no evidence of infection she has made a lot of progress here but this is still very difficult to get the close completely as far as the depth is concerned. Fortunately I do not see any signs of infection at this time. 03-03-2023 upon evaluation today patient appears to be doing well currently in regard to her wound  which is showing signs of improvement although it is very slow. Fortunately I do not see any signs of active infection at this time which is good news. No fevers, chills, nausea, vomiting, or diarrhea. 03-15-2023 upon evaluation today patient appears to be doing well currently in regard to her wound the last little bit of this that is remaining open right now is very tiny. There does not appear to be any signs of active infection at this time. No fevers, chills, nausea, vomiting, or diarrhea. 03-31-2023 upon evaluation today patient's wound does seem to be doing well I do not see any signs of need for sharp debridement she actually is healing quite nicely. I do not think that  there is any evidence of infection right now which is good news and the wound is smaller is not nearly as deep as what it was this is just very slowly. We have been using endoform. 12/31; this is the remanent of a very sizable stage IV pressure ulcer in this woman with advanced multiple sclerosis. The wound itself is in the right gluteal cleft. A small wound with a very small wound orifice but still over a centimeter of probable depth. We have been using endoform for the last 3 weeks without really any improvement at all prior to that [facility] Blue rope. The orifice of the wound is much too small for Hydrofera Blue rope and the endoform which was a good thought really is not making any improvements. 04-28-23 based on what I am seeing at this point patient appears to be doing well currently in regard to her wound. She has been tolerating the dressing changes without complication. Fortunately I do not see any evidence of active infection which is great news and overall very pleased with where things stand currently. Objective Constitutional Well-nourished and well-hydrated in no acute distress. Vitals Time Taken: 2:35 PM, Temperature: 98 F, Pulse: 73 bpm, Respiratory Rate: 16 breaths/min, Blood Pressure: 111/71 mmHg. Respiratory normal breathing without difficulty. Psychiatric this patient is able to make decisions and demonstrates good insight into disease process. Alert and Oriented x 3. pleasant and cooperative. General Notes: Patient's wound bed actually appears to be completely healed based on what I am seeing and I do not see any evidence of active infection at this time which is great news. No fevers, chills, nausea, vomiting, or diarrhea. Assessment [name], [name] ([id number]) [id number] Page 7 of 8 Active Problems ICD-10 Pressure ulcer of sacral region, stage 4 Other pulmonary embolism without acute cor pulmonale Osteomyelitis of vertebra, sacral and  sacrococcygeal region Multiple sclerosis Acute embolism and thrombosis of unspecified deep veins of unspecified lower extremity Long term (current) use of anticoagulants Plan 1. I would recommend the patient going to discontinue wound care services at this point as she does appear to be completely healed. 2. I would recommend as well that the patient should continue to offload and baby this area in order to ensure that nothing worsens in the interim. She is in agreement with that plan. Fortunately I do not see any signs of active infection at this time. Patient appears to be completely healed and we will get a discontinue wound care services currently. I do think though that she has any issues or complications she should contact the office and let me know as soon as possible. She is in agreement with that plan. Electronic Signature(s) Signed: 04/28/2023 3:13:02 PM By: Allen Derry PA-C Entered By: Allen Derry on 04/28/2023 15:13:02 -------------------------------------------------------------------------------- SuperBill Details Patient Name: Date of  Service: [name] ND, [name] 04/28/2023 Medical Record Number: [id number] Patient Account Number: [id number] Date of Birth/Sex: Treating RN: 12-22-1960 (63 y.o. [name], [name] Primary Care Provider: Enid Baas Other Clinician: Referring Provider: Treating Provider/Extender: Fredrich Birks Weeks in Treatment: 51 Diagnosis Coding ICD-10 Codes Code Description L89.154 Pressure ulcer of sacral region, stage 4 I26.99 Other pulmonary embolism without acute cor pulmonale 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, sacral and sacrococcygeal region G35 Multiple sclerosis I82.409 Acute embolism and thrombosis of unspecified deep veins of unspecified lower extremity Z79.01 Long term (current) use of anticoagulants Facility Procedures : CPT4 Code: 86578469 Description: 62952 - WOUND CARE VISIT-LEV 2 EST  PT Modifier: Quantity: 1 Physician Procedures : CPT4 Code Description Modifier [id number] - WC PHYS LEVEL 3 - EST PT ICD-10 Diagnosis Description L89.154 Pressure ulcer of sacral region, stage 4 I26.99 Other pulmonary embolism without acute cor pulmonale 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, sacral  and sacrococcygeal region [place], [name] ([id number]) [phone].pdf Page 8 G35 Multiple sclerosis Quantity: 1 of 8 Electronic Signature(s) Signed: 04/28/2023 3:13:16 PM By: Allen Derry PA-C Entered By: Allen Derry on 04/28/2023 15:13:16

## 2023-04-29 NOTE — Progress Notes (Signed)
[name] ([id number]) [id number] Page 1 of 8 Visit Report for 04/28/2023 Arrival Information Details Patient Name: Date of Service: [name] [name], [name] 04/28/2023 2:15 PM Medical Record Number: [id number] Patient Account Number: [id number] Date of Birth/Sex: Treating RN: 05/30/1960 (63 y.o. [name], [name] Primary Care [name]: [name] Other Clinician: Referring [name]: Treating [name]/Extender: [name] Weeks in Treatment: 87 Visit Information History Since Last Visit Added or deleted any medications: No Patient Arrived: Wheel Chair Any new allergies or adverse reactions: No Arrival Time: 14:40 Hospitalized since last visit: No Accompanied By: spouse Has Dressing in Place as Prescribed: Yes Transfer Assistance: Hoyer Lift Pain Present Now: No Patient Requires Transmission-Based Precautions: No Patient Has Alerts: Yes Patient Alerts: Patient on Blood Thinner Not Diabetic Xarelto Electronic Signature(s) Signed: 04/28/2023 4:06:08 PM By: Bonnell Public Entered By: Bonnell Public on 04/28/2023 14:41:03 -------------------------------------------------------------------------------- Clinic Level of Care Assessment Details Patient Name: Date of Service: [name], [name] 04/28/2023 2:15 PM Medical Record Number: [id number] Patient Account Number: [id number] Date of Birth/Sex: Treating RN: 1960-12-30 (62 y.o. [name], [name] Primary Care Siria Calandro: [name] Other Clinician: Referring Vennesa Bastedo: Treating [name]/Extender: [name] Weeks in Treatment: 74 Clinic Level of Care Assessment Items TOOL 4 Quantity Score []  - 0 Use when only an EandM is performed on FOLLOW-UP visit ASSESSMENTS - Nursing Assessment / Reassessment X- 1 10 Reassessment of Co-morbidities (includes updates in patient status) X- 1 5 Reassessment of Adherence to Treatment  Plan ASSESSMENTS - Wound and Skin A ssessment / Reassessment X - Simple Wound Assessment / Reassessment - one wound 1 5 []  - 0 Complex Wound Assessment / Reassessment - multiple wounds [name], [name] ([id number]) [phone].pdf Page 2 of 8 []  - 0 Dermatologic / Skin Assessment (not related to wound area) ASSESSMENTS - Focused Assessment []  - 0 Circumferential Edema Measurements - multi extremities []  - 0 Nutritional Assessment / Counseling / Intervention []  - 0 Lower Extremity Assessment (monofilament, tuning fork, pulses) []  - 0 Peripheral Arterial Disease Assessment (using hand held doppler) ASSESSMENTS - Ostomy and/or Continence Assessment and Care []  - 0 Incontinence Assessment and Management []  - 0 Ostomy Care Assessment and Management (repouching, etc.) PROCESS - Coordination of Care X - Simple Patient / Family Education for ongoing care 1 15 []  - 0 Complex (extensive) Patient / Family Education for ongoing care X- 1 10 Staff obtains Chiropractor, Records, T Results / Process Orders est []  - 0 Staff telephones HHA, Nursing Homes / Clarify orders / etc []  - 0 Routine Transfer to another Facility (non-emergent condition) []  - 0 Routine Hospital Admission (non-emergent condition) []  - 0 New Admissions / Manufacturing engineer / Ordering NPWT Apligraf, etc. , []  - 0 Emergency Hospital Admission (emergent condition) X- 1 10 Simple Discharge Coordination []  - 0 Complex (extensive) Discharge Coordination PROCESS - Special Needs []  - 0 Pediatric / Minor Patient Management []  - 0 Isolation Patient Management []  - 0 Hearing / Language / Visual special needs []  - 0 Assessment of Community assistance (transportation, D/C planning, etc.) []  - 0 Additional assistance / Altered mentation []  - 0 Support Surface(s) Assessment (bed, cushion, seat, etc.) INTERVENTIONS - Wound Cleansing / Measurement []  - 0 Simple Wound Cleansing - one  wound []  - 0 Complex Wound Cleansing - multiple wounds X- 1 5 Wound Imaging (photographs - any number of wounds) []  - 0 Wound Tracing (instead of photographs) []  - 0 Simple Wound Measurement - one wound []  -  0 Complex Wound Measurement - multiple wounds INTERVENTIONS - Wound Dressings X - Small Wound Dressing one or multiple wounds 1 10 []  - 0 Medium Wound Dressing one or multiple wounds []  - 0 Large Wound Dressing one or multiple wounds []  - 0 Application of Medications - topical []  - 0 Application of Medications - injection INTERVENTIONS - Miscellaneous []  - 0 External ear exam []  - 0 Specimen Collection (cultures, biopsies, blood, body fluids, etc.) []  - 0 Specimen(s) / Culture(s) sent or taken to Lab for analysis [name], [name] ([id number]) [phone].pdf Page 3 of 8 []  - 0 Patient Transfer (multiple staff / Nurse, adult / Similar devices) []  - 0 Simple Staple / Suture removal (25 or less) []  - 0 Complex Staple / Suture removal (26 or more) []  - 0 Hypo / Hyperglycemic Management (close monitor of Blood Glucose) []  - 0 Ankle / Brachial Index (ABI) - do not check if billed separately X- 1 5 Vital Signs Has the patient been seen at the hospital within the last three years: Yes Total Score: 75 Level Of Care: New/Established - Level 2 Electronic Signature(s) Signed: 04/28/2023 4:06:08 PM By: Bonnell Public Entered By: Bonnell Public on 04/28/2023 15:12:54 -------------------------------------------------------------------------------- Encounter Discharge Information Details Patient Name: Date of Service: [name], [name] 04/28/2023 2:15 PM Medical Record Number: [id number] Patient Account Number: [id number] Date of Birth/Sex: Treating RN: 09-Sep-1960 (62 y.o. [name], [name] Primary Care Huberta Tompkins: [name] Other Clinician: Referring Julus Kelley: Treating [name]/Extender: [name] Weeks in  Treatment: 17 Encounter Discharge Information Items Discharge Condition: Stable Ambulatory Status: Wheelchair Discharge Destination: Home Transportation: Private Auto Accompanied By: spouse Schedule Follow-up Appointment: No Clinical Summary of Care: Electronic Signature(s) Signed: 04/28/2023 4:06:08 PM By: Bonnell Public Entered By: Bonnell Public on 04/28/2023 15:14:29 -------------------------------------------------------------------------------- Lower Extremity Assessment Details Patient Name: Date of Service: [name], [name] 04/28/2023 2:15 PM Medical Record Number: [id number] Patient Account Number: [id number] Date of Birth/Sex: Treating RN: 07-Apr-1961 (62 y.o. [name], [name] Primary Care Arleigh Dicola: [name] Other Clinician: Referring Levina Boyack: Treating Lachell Rochette/Extender: [name] [name], [name] ([id number]) [id number] Page 4 of 8 Weeks in Treatment: 51 Electronic Signature(s) Signed: 04/28/2023 4:06:08 PM By: Bonnell Public Entered By: Bonnell Public on 04/28/2023 14:41:57 -------------------------------------------------------------------------------- Multi Wound Chart Details Patient Name: Date of Service: [name], [name] 04/28/2023 2:15 PM Medical Record Number: [id number] Patient Account Number: [id number] Date of Birth/Sex: Treating RN: Mar 12, 1961 (62 y.o. [name], [name] Primary Care Andraya Frigon: [name] Other Clinician: Referring Kaari Zeigler: Treating Bedelia Pong/Extender: [name] Weeks in Treatment: 18 Vital Signs Height(in): Pulse(bpm): 73 Weight(lbs): Blood Pressure(mmHg): 111/71 Body Mass Index(BMI): Temperature(F): 98 Respiratory Rate(breaths/min): 16 [3:Photos:] [N/A:N/A] Proximal, Midline Sacrum N/A N/A Wound Location: Pressure Injury N/A N/A Wounding Event: Pressure Ulcer N/A N/A Primary Etiology: Angina, Hypotension N/A N/A Comorbid  History: 03/09/2022 N/A N/A Date Acquired: 71 N/A N/A Weeks of Treatment: Open N/A N/A Wound Status: No N/A N/A Wound Recurrence: 0x0x0 N/A N/A Measurements L x W x D (cm) 0 N/A N/A A (cm) : rea 0 N/A N/A Volume (cm) : 100.00% N/A N/A % Reduction in A rea: 100.00% N/A N/A % 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 N/A N/A Classification: None Present N/A N/A Exudate A mount: Epibole N/A N/A Wound Margin: None Present (0%) N/A N/A Granulation A mount: None Present (0%) N/A N/A Necrotic A mount: Fascia: No N/A N/A Exposed Structures: Fat Layer (Subcutaneous Tissue): No Tendon: No Muscle: No  Joint: No Bone: No Medium (34-66%) N/A N/A Epithelialization: Treatment Notes [name], [name] ([id number]) [phone].pdf Page 5 of 8 Electronic Signature(s) Signed: 04/28/2023 4:06:08 PM By: Bonnell Public Entered By: Bonnell Public on 04/28/2023 15:12:06 -------------------------------------------------------------------------------- Multi-Disciplinary Care Plan Details Patient Name: Date of Service: [name], [name] 04/28/2023 2:15 PM Medical Record Number: [id number] Patient Account Number: [id number] Date of Birth/Sex: Treating RN: 1960-09-05 (62 y.o. [name], [name] Primary Care Omarian Jaquith: [name] Other Clinician: Referring Morrie Daywalt: Treating Zoanne Newill/Extender: [name] Weeks in Treatment: 85 Active Inactive Electronic Signature(s) Signed: 04/28/2023 4:06:08 PM By: Bonnell Public Entered By: Bonnell Public on 04/28/2023 15:13:08 -------------------------------------------------------------------------------- Pain Assessment Details Patient Name: Date of Service: [name], [name] 04/28/2023 2:15 PM Medical Record Number: [id number] Patient Account Number: [id number] Date of Birth/Sex: Treating RN: 1960-09-18 (62 y.o. [name], [name] Primary Care Evanna Washinton: [name] Other  Clinician: Referring Shellia Hartl: Treating [name]/Extender: [name] Weeks in Treatment: 12 Active Problems Location of Pain Severity and Description of Pain Patient Has Paino No Site Locations [place], [name] ([id number]) [id number] Page 6 of 8 Pain Management and Medication Current Pain Management: Electronic Signature(s) Signed: 04/28/2023 4:06:08 PM By: Bonnell Public Entered By: Bonnell Public on 04/28/2023 14:41:38 -------------------------------------------------------------------------------- Patient/Caregiver Education Details Patient Name: Date of Service: [name], [name] 1/15/2025andnbsp2:15 PM Medical Record Number: [id number] Patient Account Number: [id number] Date of Birth/Gender: Treating RN: 1960/10/09 (63 y.o. [name], [name] Primary Care Physician: [name] Other Clinician: Referring Physician: Treating Physician/Extender: [name] Weeks in Treatment: 49 Education Assessment Education Provided To: Patient spouse Education Topics Provided Discharge Packet: Handouts: Other: contact clinic with any evidence of drainage or re-opening; continue to protect and offload area Methods: Explain/Verbal Responses: State content correctly Electronic Signature(s) Signed: 04/28/2023 4:06:08 PM By: Bonnell Public Entered By: Bonnell Public on 04/28/2023 15:13:45 [name] ([id number]) [id number] Page 7 of 8 -------------------------------------------------------------------------------- Wound Assessment Details Patient Name: Date of Service: [name], [name] 04/28/2023 2:15 PM Medical Record Number: [id number] Patient Account Number: [id number] Date of Birth/Sex: Treating RN: Apr 24, 1960 (63 y.o. [name], [name] Primary Care Somya Jauregui: [name] Other Clinician: Referring Severin Bou: Treating Lena Gores/Extender: [name] Weeks in Treatment: 3 Wound Status Wound Number: 3 Primary Etiology: Pressure Ulcer Wound Location: Proximal, Midline Sacrum Wound Status: Open Wounding Event: Pressure Injury Comorbid History: Angina, Hypotension Date Acquired: 03/09/2022 Weeks Of Treatment: 51 Clustered Wound: No Photos Wound Measurements Length: (cm) Width: (cm) Depth: (cm) Area: (cm) Volume: (cm) 0 % Reduction in Area: 100% 0 % Reduction in Volume: 100% 0 Epithelialization: Medium (34-66%) 0 0 Wound Description Classification: Category/Stage IV Wound Margin: Epibole Exudate Amount: None Present Foul Odor After Cleansing: No Slough/Fibrino No Wound Bed Granulation Amount: None Present (0%) Exposed Structure Necrotic Amount: None Present (0%) Fascia Exposed: No Fat Layer (Subcutaneous Tissue) Exposed: No Tendon Exposed: No Muscle Exposed: No Joint Exposed: No Bone Exposed: No Electronic Signature(s) Signed: 04/28/2023 4:06:08 PM By: Bonnell Public Entered By: Bonnell Public on 04/28/2023 15:11:25 [name] ([id number]) [id number] Page 8 of 8 -------------------------------------------------------------------------------- Vitals Details Patient Name: Date of Service: [name], [name] 04/28/2023 2:15 PM Medical Record Number: [id number] Patient Account Number: [id number] Date of Birth/Sex: Treating RN: March 26, 1961 (63 y.o. [name], [name] Primary Care Leodis Alcocer: [name] Other Clinician: Referring Keino Placencia: Treating Sumner Kirchman/Extender: [name] Weeks in Treatment: 61 Vital Signs Time Taken: 14:35 Temperature (F): 98 Pulse (bpm): 73  Respiratory Rate (breaths/min): 16 Blood Pressure (mmHg): 111/71 Reference Range: 80 - 120 mg / dl Electronic Signature(s) Signed: 04/28/2023 4:06:08 PM By: Bonnell Public Entered By: Bonnell Public on 04/28/2023 14:41:33

## 2023-05-03 ENCOUNTER — Ambulatory Visit: Payer: Medicare Other | Admitting: Neurology

## 2023-05-05 ENCOUNTER — Ambulatory Visit (INDEPENDENT_AMBULATORY_CARE_PROVIDER_SITE_OTHER): Payer: Medicare Other | Admitting: Neurology

## 2023-05-05 DIAGNOSIS — G8220 Paraplegia, unspecified: Secondary | ICD-10-CM | POA: Diagnosis not present

## 2023-05-05 DIAGNOSIS — G35 Multiple sclerosis: Secondary | ICD-10-CM | POA: Diagnosis not present

## 2023-05-05 MED ORDER — INCOBOTULINUMTOXINA 100 UNITS IM SOLR
600.0000 [IU] | Freq: Once | INTRAMUSCULAR | Status: AC
Start: 2023-05-05 — End: 2023-05-05
  Administered 2023-05-05: 600 [IU] via INTRAMUSCULAR

## 2023-05-05 NOTE — Progress Notes (Signed)
xeomin 100 units x 6 vial  [id number] [id number]: [id number] Exp-03/2025 Exp: 04/2025 B/B  Bacteriostatic 0.9% Sodium Chloride- 12 mL  [id number] Expiration: 02/12/2024 NDC: 7253-6644-03 Dx: K74.25, G35  WITNESSED BY: Duffy Bruce CMA

## 2023-05-05 NOTE — Progress Notes (Signed)
Chief Complaint  Patient presents with   Injections    Pt in 15,  Pt is here for injections.       ASSESSMENT AND PLAN  [name] is a 63 y.o. female   Secondary progressive multiple sclerosis Spastic paraplegia  Significant bilateral knee flex contraction, limited range of motion even with passive stretch, painful, tightness of bilateral hamstring tendon,  Electrical stimulation guided xeomin injection, used 600 units, (100 units of xeomin was dissolved into to 2 cc of normal saline)  Right semimembranosus 50 units Right semitendinosus 100 units Right biceps femoris long head 100 units Right biceps femoris short head 50 units  Left semimembranosus 50 units Left semitendinosis 100 units Left biceps femoris long head 100 units Right biceps femoris short head 50 units  Will also refer her to orthopedic surgeon for evaluation, possible candidate for tendon lengthening procedure   DIAGNOSTIC DATA (LABS, IMAGING, TESTING) - I reviewed patient records, labs, notes, testing and imaging myself where available.   MEDICAL HISTORY:  [name], is a 63 year old female, accompanied by her boyfriend seen in request by  neurologist Morene Crocker, at [facility] for evaluation of botulism toxin injection for spastic bilateral lower extremity, her primary care physician is Dr. Nemiah Commander, [place], initial evaluation was on March 30, 2023  History is obtained from the patient and review of electronic medical records. I personally reviewed pertinent available imaging films in PACS.   PMHx of  Restless leg syndrome Urinary incontinence. GERD DVT-PE during COVID in 2023,  Depression, Chronic insominia Neuropathic pain,  She was diagnosed with relapsing remitting multiple sclerosis around 1998, presenting with gait abnormality also complains of hand clumsiness, paresthesia, she was diagnosed and treated at [place] until she moved to  [place] in 2021, she was treated with injectable Avonex, from 1998-2011, then switched to Gilenya from 2011-2021, now receiving Ocrevus at home infusions since 2021   At baseline prior to her COVID infection in November 2023, she was able to ambulate with walker, doing basic house chores, she suffered significant setback since her COVID infection in Nov 2023, she had bilateral lower extremity DVT, pulmonary emboli, had pulmonary thrombectomy, massive amount of clot removed,acute hypoxic respiratory failure, bilateral pneumonia, pulmonary hemorrhage, then hospital admission in January 2024 for sepsis secondary to UTI, sacral cellulitis,  Since above severe medical issues, she spent lengthy time in her bed, despite rehabilitation, she has developed severe contraction of bilateral lower extremity, now wheelchair-bound, difficult to straighten out her lower extremity, could not bear weight, needing help in transportation,  She is hoping to receive botulism toxin injection to relax bilateral lower extremity contraction, help weightbearing and transfer  Today's examination showed painful lower extremity fixed knee flexion, limited range of motion, tight bilateral hamstring muscles  UPDATE May 05 2023: This is her first electrical stimulation guided xeomin injection for spastic bilateral lower extremity, used xeomin 600 units, potential side effect explained, consent form was signed,   PHYSICAL EXAM:      03/30/2023    8:15 AM 03/05/2023   10:34 AM 10/27/2022   10:58 AM  Vitals with BMI  Height 5\' 2"     Systolic 103 104 89  Diastolic 68 59 69  Pulse 87 84 114     PHYSICAL EXAMNIATION:  Gen: NAD, conversant, well nourised, well groomed                     Cardiovascular: Regular rate rhythm, no peripheral edema, warm,  nontender. Eyes: Conjunctivae clear without exudates or hemorrhage Neck: Supple, no carotid bruits. Pulmonary: Clear to auscultation bilaterally   NEUROLOGICAL  EXAM:  MENTAL STATUS: Speech/cognition: Awake, alert, oriented to history taking and casual conversation CRANIAL NERVES: CN II: Visual fields are full to confrontation. Pupils are round equal and briskly reactive to light. CN III, IV, VI: extraocular movement are normal. No ptosis. CN V: Facial sensation is intact to light touch CN VII: Face is symmetric with normal eye closure  CN VIII: Hearing is normal to causal conversation. CN IX, X: Phonation is normal. CN XI: Head turning and shoulder shrug are intact  MOTOR: Mild bilateral shoulder abduction, grip weakness, left worse than right, mild fixation of left upper extremity on rapid rotating movement.  Fixed contraction of bilateral lower extremity, bilateral hip adduction, even with passive stretch, maximum right knee was about 100, left was 110 degree, complains of bilateral hamstring muscle and around knee tendon pain, tendency for right knee adductor towards the left side,  REFLEXES: Hypoactive  SENSORY: Length-dependent sensory changes  COORDINATION: There is no trunk or limb dysmetria noted.  GAIT/STANCE: Wheelchair-bound  REVIEW OF SYSTEMS:  Full 14 system review of systems performed and notable only for as above All other review of systems were negative.   ALLERGIES: Allergies  Allergen Reactions   Erythromycin Hives and Nausea And Vomiting   Hydrocodone-Acetaminophen Other (See Comments)    Paranoid and depressed    Lexapro [Escitalopram Oxalate] Hives    HOME MEDICATIONS: Current Outpatient Medications  Medication Sig Dispense Refill   ascorbic acid (VITAMIN C) 500 MG tablet Take 500 mg by mouth daily.     Cholecalciferol 50 MCG (2000 UT) CAPS Take 2,000 Units by mouth daily.     clindamycin (CLEOCIN) 300 MG capsule Take 300 mg by mouth 3 (three) times daily.     collagenase (SANTYL) 250 UNIT/GM ointment Apply 1 Application topically daily.     dalfampridine 10 MG TB12 Take 1 tablet by mouth every 12  (twelve) hours.     DULoxetine (CYMBALTA) 30 MG capsule Take 30 mg by mouth daily.     gabapentin (NEURONTIN) 800 MG tablet Take 400-800 mg by mouth 2 (two) times daily.     levofloxacin (LEVAQUIN) 750 MG tablet Take 1 tablet (750 mg total) by mouth daily. 30 tablet 1   midodrine (PROAMATINE) 5 MG tablet Take 1 tablet (5 mg total) by mouth 2 (two) times daily with a meal. 14 tablet 0   omeprazole (PRILOSEC) 20 MG capsule Take 20 mg by mouth daily.     oxybutynin (DITROPAN-XL) 10 MG 24 hr tablet Take 10 mg by mouth daily.     pregabalin (LYRICA) 75 MG capsule Take 75 mg by mouth 3 (three) times daily.     propranolol (INDERAL) 20 MG tablet Take 20 mg by mouth 2 (two) times daily.     rivaroxaban (XARELTO) 20 MG TABS tablet Take 1 tablet (20 mg total) by mouth daily with supper. 30 tablet 0   rOPINIRole (REQUIP) 0.5 MG tablet Take 0.5 mg by mouth at bedtime.     traMADol (ULTRAM) 50 MG tablet Take 50 mg by mouth 2 (two) times daily as needed.     traZODone (DESYREL) 50 MG tablet Take 50 mg by mouth at bedtime.     trimethoprim (TRIMPEX) 100 MG tablet Take 100 mg by mouth daily.     Current Facility-Administered Medications  Medication Dose Route Frequency Provider Last Rate Last Admin   incobotulinumtoxinA (  XEOMIN) 100 units injection 600 Units  600 Units Intramuscular Once Levert Feinstein, MD        PAST MEDICAL HISTORY: Past Medical History:  Diagnosis Date   Bilateral pulmonary embolism (HCC)    Chronic constipation    Family history of pancreatic cancer    Frequent falls    GERD (gastroesophageal reflux disease)    Hiatal hernia    Multiple sclerosis (HCC)    Pulmonary hemorrhage    RLS (restless legs syndrome)    Sepsis due to pneumonia (HCC)    Tachycardia     PAST SURGICAL HISTORY: Past Surgical History:  Procedure Laterality Date   ESOPHAGOGASTRODUODENOSCOPY (EGD) WITH PROPOFOL N/A 09/11/2021   Procedure: ESOPHAGOGASTRODUODENOSCOPY (EGD) WITH PROPOFOL;  Surgeon: Jaynie Collins, DO;  Location: [facility] ENDOSCOPY;  Service: Gastroenterology;  Laterality: N/A;   PULMONARY THROMBECTOMY Bilateral 02/25/2022   Procedure: PULMONARY THROMBECTOMY;  Surgeon: Annice Needy, MD;  Location: [facility] INVASIVE CV LAB;  Service: Cardiovascular;  Laterality: Bilateral;   TUBAL LIGATION      FAMILY HISTORY: No family history on file.  SOCIAL HISTORY: Social History   Socioeconomic History   Marital status: Widowed    Spouse name: Not on file   Number of children: Not on file   Years of education: Not on file   Highest education level: Not on file  Occupational History   Not on file  Tobacco Use   Smoking status: Never   Smokeless tobacco: Never  Vaping Use   Vaping status: Never Used  Substance and Sexual Activity   Alcohol use: Never   Drug use: Never   Sexual activity: Not Currently  Other Topics Concern   Not on file  Social History Narrative   Not on file   Social Drivers of Health   Financial Resource Strain: Low Risk  (01/19/2023)   Received from [facility]   Overall Financial Resource Strain (CARDIA)    Difficulty of Paying Living Expenses: Not hard at all  Food Insecurity: No Food Insecurity (01/19/2023)   Received from [facility]   Hunger Vital Sign    Worried About Running Out of Food in the Last Year: Never true    Ran Out of Food in the Last Year: Never true  Transportation Needs: No Transportation Needs (01/19/2023)   Received from [facility]    In the past 12 months, has lack of transportation kept you from medical appointments or from getting medications?: No    Lack of Transportation (Non-Medical): No  Physical Activity: Not on file  Stress: Not on file  Social Connections: Not on file  Intimate Partner Violence: Not At Risk (04/17/2022)   Humiliation, Afraid, Rape, and Kick questionnaire    Fear of Current or Ex-Partner: No    Emotionally Abused: No     Physically Abused: No    Sexually Abused: No      Levert Feinstein, M.D. Ph.D.  [facility] [address] [place], [place] [address] Ph: [phone] Fax: [phone]  CC:  Enid Baas, MD [address] [place],  [place] [address]  Enid Baas, MD

## 2023-05-06 ENCOUNTER — Telehealth: Payer: Self-pay | Admitting: Neurology

## 2023-05-06 DIAGNOSIS — G35 Multiple sclerosis: Secondary | ICD-10-CM

## 2023-05-06 NOTE — Telephone Encounter (Signed)
Referral  for orthopedic surgery fax to [facility]. Phone: [phone], Fax: [phone]

## 2023-06-01 NOTE — Telephone Encounter (Signed)
Referral for orthopedic surgery fax to [facility]. Phone: [phone], Fax: [phone]

## 2023-06-01 NOTE — Telephone Encounter (Signed)
 Received faxed message from [facility]: Thank you for the referral, but unfortunately we do not have any providers that will treat this. Thanks.  Do you have somewhere else you would like me to send the referral

## 2023-06-01 NOTE — Telephone Encounter (Signed)
 [facility]   Address: [address] [place], [place] [address] Hours:  Open ? Closes 5?PM Updated by this business 4 weeks ago Phone: [phone]

## 2023-06-08 ENCOUNTER — Ambulatory Visit: Payer: Medicare Other | Admitting: Urology

## 2023-06-08 VITALS — BP 104/75 | HR 68

## 2023-06-08 DIAGNOSIS — R350 Frequency of micturition: Secondary | ICD-10-CM

## 2023-06-08 DIAGNOSIS — N319 Neuromuscular dysfunction of bladder, unspecified: Secondary | ICD-10-CM

## 2023-06-08 LAB — BLADDER SCAN AMB NON-IMAGING: Scan Result: 639

## 2023-06-08 NOTE — Progress Notes (Signed)
 I,[name],acting as a scribe for Vanna Scotland, MD.,have documented all relevant documentation on the behalf of Vanna Scotland, MD,as directed by  Vanna Scotland, MD while in the presence of Vanna Scotland, MD.  06/08/2023 3:00 PM   [name], [name] [id number]  Referring provider: Enid Baas, MD [address] [place],  [place] [address]  Chief Complaint  Patient presents with   Establish Care   Cystitis    HPI: 63 year-old female with a personal history of MS and severe urinary incontinence presents today to establish care.  She is currently being managed with 50 mg of Oxybutynin XL; previously tried Detrol which doesn't help. She has urgency, frequency and incontinence, along with enuresis.   She has multiple medical comorbidities and had a prolonged ICU admission with aspiration pneumonia, bilateral P.E.s, and development of decubitus ulcers.   She is also followed closely by neurology. She has contractures which are managed with Botox injections.  She has also had several urinary tract infections; but none recently. Her most recent urinalysis, six months ago, was fairly unremarkable, with just a few RBCs and WBCs  Her last upper tract imaging was in the form of CT chest, abdomen, pelvis, on 04/16/2022 that showed a small amount of air in the bladder lumen, otherwise unremarkable. No hydronephrosis; kidneys were normal; no bladder wall thickening.   Her baseline creatinine is normal at 0.5.Many patients were diagnosed with chronic pain.  She was unable to void here in the office to give a urine sample today because she voided at home just before she left.   She is currently non weight bearing. She is working with physical therapy. She desires to be able to run errands and not have accidents which cause embarrassment.   She is accompanied today by her spouse who indicates when he picks her up to move her, she urinates.  This is typically large  volume incontinence. Even if she urinates before him moving her, when he lifts her up she will leak again.  She has never had a urodynamics test or Botox for her bladder. She has been told in the past that the issue may be that she can't fully empty her bladder.  She reports normal bowel habits.   Results for orders placed or performed in visit on 06/08/23  Bladder Scan (Post Void Residual) in office  Result Value Ref Range   Scan Result 639 ml     PMH: Past Medical History:  Diagnosis Date   Bilateral pulmonary embolism (HCC)    Chronic constipation    Family history of pancreatic cancer    Frequent falls    GERD (gastroesophageal reflux disease)    Hiatal hernia    Multiple sclerosis (HCC)    Pulmonary hemorrhage    RLS (restless legs syndrome)    Sepsis due to pneumonia (HCC)    Tachycardia     Surgical History: Past Surgical History:  Procedure Laterality Date   ESOPHAGOGASTRODUODENOSCOPY (EGD) WITH PROPOFOL N/A 09/11/2021   Procedure: ESOPHAGOGASTRODUODENOSCOPY (EGD) WITH PROPOFOL;  Surgeon: Jaynie Collins, DO;  Location: [facility] ENDOSCOPY;  Service: Gastroenterology;  Laterality: N/A;   PULMONARY THROMBECTOMY Bilateral 02/25/2022   Procedure: PULMONARY THROMBECTOMY;  Surgeon: Annice Needy, MD;  Location: [facility] INVASIVE CV LAB;  Service: Cardiovascular;  Laterality: Bilateral;   TUBAL LIGATION      Home Medications:  Allergies as of 06/08/2023       Reactions   Erythromycin Hives, Nausea And Vomiting   Hydrocodone-acetaminophen Other (  See Comments)   Paranoid and depressed    Lexapro [escitalopram Oxalate] Hives        Medication List        Accurate as of June 08, 2023  3:00 PM. If you have any questions, ask your nurse or doctor.          STOP taking these medications    clindamycin 300 MG capsule Commonly known as: CLEOCIN   levofloxacin 750 MG tablet Commonly known as: Levaquin   oxybutynin 10 MG 24 hr tablet Commonly known as:  DITROPAN-XL   rOPINIRole 0.5 MG tablet Commonly known as: REQUIP   Santyl 250 UNIT/GM ointment Generic drug: collagenase       TAKE these medications    ascorbic acid 500 MG tablet Commonly known as: VITAMIN C Take 500 mg by mouth daily.   Cholecalciferol 50 MCG (2000 UT) Caps Take 2,000 Units by mouth daily.   dalfampridine 10 MG Tb12 Take 1 tablet by mouth every 12 (twelve) hours.   DULoxetine [name] MG capsule Commonly known as: CYMBALTA Take [name] mg by mouth daily.   gabapentin 800 MG tablet Commonly known as: NEURONTIN Take 400-800 mg by mouth 2 (two) times daily.   midodrine 5 MG tablet Commonly known as: PROAMATINE Take 1 tablet (5 mg total) by mouth 2 (two) times daily with a meal.   omeprazole 20 MG capsule Commonly known as: PRILOSEC Take 20 mg by mouth daily.   pregabalin 75 MG capsule Commonly known as: LYRICA Take 75 mg by mouth 3 (three) times daily.   propranolol 20 MG tablet Commonly known as: INDERAL Take 20 mg by mouth 2 (two) times daily.   rivaroxaban 20 MG Tabs tablet Commonly known as: XARELTO Take 1 tablet (20 mg total) by mouth daily with supper.   traMADol 50 MG tablet Commonly known as: ULTRAM Take 50 mg by mouth 2 (two) times daily as needed.   traZODone 50 MG tablet Commonly known as: DESYREL Take 50 mg by mouth at bedtime.   trimethoprim 100 MG tablet Commonly known as: TRIMPEX Take 100 mg by mouth daily.        Allergies:  Allergies  Allergen Reactions   Erythromycin Hives and Nausea And Vomiting   Hydrocodone-Acetaminophen Other (See Comments)    Paranoid and depressed    Lexapro [Escitalopram Oxalate] Hives    Social History:  reports that she has never smoked. She has never used smokeless tobacco. She reports that she does not drink alcohol and does not use drugs.   Physical Exam: BP 104/75   Pulse 68   Constitutional:  Alert and oriented, No acute distress. Mobile wheel chair today.  Legs are able to be  spread about 12 inches about.  Contractures noted. HEENT: [place] AT, moist mucus membranes.  Trachea midline, no masses. Psychiatric: Normal mood and affect.   Pertinent Imaging: Narrative & Impression  CLINICAL DATA:  Sepsis.  Fever.   EXAM: CT CHEST, ABDOMEN, AND PELVIS WITH CONTRAST   TECHNIQUE: Multidetector CT imaging of the chest, abdomen and pelvis was performed following the standard protocol during bolus administration of intravenous contrast.   RADIATION DOSE REDUCTION: This exam was performed according to the departmental dose-optimization program which includes automated exposure control, adjustment of the mA and/or kV according to patient size and/or use of iterative reconstruction technique.   CONTRAST:  OMNIPAQUE IOHEXOL 300 MG/ML  SOLN   COMPARISON:  Chest radiographs same date. CT of the chest, abdomen and pelvis 03/19/2022 and  03/03/2022.   FINDINGS: CT CHEST FINDINGS   Cardiovascular: No acute vascular findings. No significant atherosclerosis demonstrated. The heart size is normal. There is no pericardial effusion.   Mediastinum/Nodes: There are no enlarged mediastinal, hilar or axillary lymph nodes. Posterior left thyroid nodule again noted measuring up to 1.8 cm in diameter. If not previously performed, recommend thyroid ultrasound. (Ref: J Am Coll Radiol. 2015 Feb;12(2): 143-50). Enteric tube has been removed in the interval. There is a moderate size hiatal hernia.   Lungs/Pleura: No pleural effusion or pneumothorax. Previously demonstrated diffuse bilateral airspace opacities have significantly improved in the interval. There are scattered small areas of residual inflammation or postinflammatory scarring dependently in both upper and lower lobes. No new pulmonary findings.   Musculoskeletal/Chest wall: No chest wall mass or suspicious osseous findings.   CT ABDOMEN AND PELVIS FINDINGS   Hepatobiliary: The liver is normal in density without  suspicious focal abnormality. No evidence of gallstones, gallbladder wall thickening or biliary dilatation.   Pancreas: Unremarkable. No pancreatic ductal dilatation or surrounding inflammatory changes.   Spleen: Normal in size without focal abnormality.   Adrenals/Urinary Tract: Both adrenal glands appear normal. No evidence of urinary tract calculus, suspicious renal lesion or hydronephrosis. A small amount of air is noted non dependently within the bladder lumen. Previously demonstrated hyperdense focus dependently on the right is no longer seen. There is no bladder wall thickening.   Stomach/Bowel: No enteric contrast administered. As above, moderate-size hiatal hernia. The stomach otherwise appears unremarkable for its degree of distention. No evidence of bowel wall thickening, distention or surrounding inflammation. The appendix appears normal. There is moderate stool throughout the colon. There is new presacral soft tissue stranding which appears non circumferential and without associated abnormality of the rectum.   Vascular/Lymphatic: There are no enlarged abdominal or pelvic lymph nodes. No significant vascular findings.   Reproductive: The uterus and ovaries appear stable. Probable small posterior uterine fundal fibroid. No adnexal mass.   Other: As above, new presacral soft tissue stranding without focal fluid collection, foreign body or soft tissue emphysema. No ascites or free air.   Musculoskeletal: Apparent decubitus ulceration over the distal sacrum and coccyx with adjacent subcutaneous soft tissue stranding which may account for the pre sacral edema. No evidence of osteomyelitis. No evidence of acute fracture or dislocation. Mild facet hypertrophy noted. There is asymmetric fluid lateral to the right greater trochanter which appears similar to previous study, likely reflecting trochanteric bursitis. The soft tissues lateral to the hips are incompletely  visualized on this examination.   IMPRESSION: 1. Interval improvement in previously demonstrated diffuse bilateral airspace opacities consistent with resolving pneumonia. There are scattered small areas of residual inflammation or postinflammatory scarring dependently in both upper and lower lobes. No new pulmonary findings. 2. Apparent decubitus ulceration over the distal sacrum and coccyx with adjacent subcutaneous soft tissue stranding. No evidence of osteomyelitis or abscess. 3. New presacral soft tissue stranding without associated abnormality of the rectum. This is nonspecific and could be inflammatory or posttraumatic. 4. Small amount of air non dependently within the bladder lumen, likely iatrogenic. Previously demonstrated hyperdense focus dependently on the right is no longer seen. 5. Moderate-size hiatal hernia. 6. Stable asymmetric fluid lateral to the right greater trochanter, likely reflecting trochanteric bursitis. 7. Stable left thyroid nodule. If not previously performed, recommend thyroid ultrasound.   Electronically Signed   By: Carey Bullocks M.D.   On: 04/16/2022 16:19  Personally reviewed the above scan and agree with radiologic interpretation.  Assessment & Plan:    1. Neurogenic bladder - Differential diagnosis includes urinary incontinence with possible overflow incontinence versus overactive bladder. - Discontinue Oxybutynin XL as it may exacerbate overflow incontinence and has not been helpful.   - Consider urodynamic testing to assess bladder function, capacity, and reflux risk. Educate on intermittent catheterization for better bladder emptying, especially during travel or social situations (may even consider intermittent indwelling).  This may or may not be possible given her contractures but with trying.  - Mentioned the possibility of referral to a specialist for potential bladder augmentation or other surgical interventions if conservative  measures fail. -Lengthy goals discussion  2. Recurrent UTIs - Continue suppressive low-dose Trimethoprim for UTI prevention. - Monitor for signs of infection and adjust treatment as necessary. -RUS as below  3. Upper tract assessment - Order a renal ultrasound to assess for any kidney swelling or damage due to potential urine reflux.  4. Neurological Management - Continue coordination with neurology for comprehensive management of MS and associated symptoms.  Follow-up with PA for catheter teaching  I have reviewed the above documentation for accuracy and completeness, and I agree with the above.   Vanna Scotland, MD   [facility] [address], [address] [place], [place] [address] [phone]  I spent 50 total minutes on the day of the encounter including pre-visit review of the medical record, face-to-face time with the patient, and post visit ordering of labs/imaging/tests.

## 2023-06-11 ENCOUNTER — Ambulatory Visit
Admission: RE | Admit: 2023-06-11 | Discharge: 2023-06-11 | Disposition: A | Discharge status: Home / Self Care | Payer: Medicare Other | Source: Ambulatory Visit | Attending: Urology | Admitting: Urology

## 2023-06-11 DIAGNOSIS — N319 Neuromuscular dysfunction of bladder, unspecified: Secondary | ICD-10-CM | POA: Diagnosis present

## 2023-06-16 NOTE — Progress Notes (Signed)
 06/17/2023 9:06 PM   [name] 1960-11-05 [id number]  Referring provider: Enid Baas, MD [address] [place],  [place] [address]  Urological history: 1. Neurogenic bladder -contributing factors of age and MS  2. rUTI's  -Contributing factors of incomplete bladder emptying, MS, incontinence and nonambulatory status -Documented urine cultures over the last year  None -Trimethoprim 100 mg daily  Chief Complaint  Patient presents with   Other    Self cath video   HPI: [name] is a 63 y.o. female who presents today to be instructed in CIC with her SO, Reuel Boom.   Previous records reviewed.   She is having a very bothersome issue of having severe incontinence when she is picked up for transfer, making it uncomfortable for her to engage in social outings.  We will go ahead and see if CIC prior to outings will help decrease the amount of incontinence she experiences.  PMH: Past Medical History:  Diagnosis Date   Bilateral pulmonary embolism (HCC)    Chronic constipation    Family history of pancreatic cancer    Frequent falls    GERD (gastroesophageal reflux disease)    Hiatal hernia    Multiple sclerosis (HCC)    Pulmonary hemorrhage    RLS (restless legs syndrome)    Sepsis due to pneumonia (HCC)    Tachycardia     Surgical History: Past Surgical History:  Procedure Laterality Date   ESOPHAGOGASTRODUODENOSCOPY (EGD) WITH PROPOFOL N/A 09/11/2021   Procedure: ESOPHAGOGASTRODUODENOSCOPY (EGD) WITH PROPOFOL;  Surgeon: Jaynie Collins, DO;  Location: [facility] ENDOSCOPY;  Service: Gastroenterology;  Laterality: N/A;   PULMONARY THROMBECTOMY Bilateral 02/25/2022   Procedure: PULMONARY THROMBECTOMY;  Surgeon: Annice Needy, MD;  Location: [facility] INVASIVE CV LAB;  Service: Cardiovascular;  Laterality: Bilateral;   TUBAL LIGATION      Home Medications:  Allergies as of 06/17/2023       Reactions   Erythromycin Hives, Nausea And  Vomiting   Hydrocodone-acetaminophen Other (See Comments)   Paranoid and depressed    Lexapro [escitalopram Oxalate] Hives        Medication List        Accurate as of June 17, 2023 11:59 PM. If you have any questions, ask your nurse or doctor.          ascorbic acid 500 MG tablet Commonly known as: VITAMIN C Take 500 mg by mouth daily.   Cholecalciferol 50 MCG (2000 UT) Caps Take 2,000 Units by mouth daily.   dalfampridine 10 MG Tb12 Take 1 tablet by mouth every 12 (twelve) hours.   DULoxetine 30 MG capsule Commonly known as: CYMBALTA Take 30 mg by mouth daily.   gabapentin 800 MG tablet Commonly known as: NEURONTIN Take 400-800 mg by mouth 2 (two) times daily.   methocarbamol 750 MG tablet Commonly known as: ROBAXIN Take by mouth.   midodrine 5 MG tablet Commonly known as: PROAMATINE Take 1 tablet (5 mg total) by mouth 2 (two) times daily with a meal.   omeprazole 20 MG capsule Commonly known as: PRILOSEC Take 20 mg by mouth daily.   pregabalin 75 MG capsule Commonly known as: LYRICA Take 75 mg by mouth 3 (three) times daily.   propranolol 20 MG tablet Commonly known as: INDERAL Take 20 mg by mouth 2 (two) times daily.   rivaroxaban 20 MG Tabs tablet Commonly known as: XARELTO Take 1 tablet (20 mg total) by mouth daily with supper.   traMADol 50 MG tablet Commonly known  as: ULTRAM Take 50 mg by mouth 2 (two) times daily as needed.   traZODone 50 MG tablet Commonly known as: DESYREL Take 50 mg by mouth at bedtime.   trimethoprim 100 MG tablet Commonly known as: TRIMPEX Take 100 mg by mouth daily.        Allergies:  Allergies  Allergen Reactions   Erythromycin Hives and Nausea And Vomiting   Hydrocodone-Acetaminophen Other (See Comments)    Paranoid and depressed    Lexapro [Escitalopram Oxalate] Hives    Family History: No family history on file.  Social History:  reports that she has never smoked. She has never used smokeless  tobacco. She reports that she does not drink alcohol and does not use drugs.  ROS: Pertinent ROS in HPI  Physical Exam: BP 118/79   Pulse 68   Ht 5\' 2"  (1.575 m)   Wt 128 lb (58.1 kg)   BMI 23.41 kg/m   Constitutional:  Well nourished. Alert and oriented, No acute distress. HEENT: [place] AT, moist mucus membranes.  Trachea midline Cardiovascular: No clubbing, cyanosis, or edema. Respiratory: Normal respiratory effort, no increased work of breathing. GU: No CVA tenderness.  No bladder fullness or masses.  Recession of labia minora, dry, pale vulvar vaginal mucosa and loss of mucosal ridges and folds.  Normal urethral meatus, no lesions, no prolapse, no discharge.   No urethral masses, tenderness and/or tenderness. Neurologic: Grossly intact, no focal deficits, moving all 4 extremities. Psychiatric: Normal mood and affect.    Laboratory Data: Lab Results  Component Value Date   WBC 7.4 03/05/2023   HGB 14.1 03/05/2023   HCT 45.1 03/05/2023   MCV 88.3 03/05/2023   PLT 197 03/05/2023  I have reviewed the labs.   Pertinent Imaging: N/A   Assessment & Plan:    1. Neurogenic bladder -I had her and Reuel Boom watched the self catheterization video.  We then attempted to straight catheter while she was sitting in her chair, but her leg contractions made this almost a possible.  We then laid her on her side in a fetal position, so that we can access her urethra from below.  I was able to pass a female Luja catheter easily into her bladder and drained 300 cc.  Reuel Boom was given samples of the Luje and other catheters to see which ones will work best for him.    2. rUTI's -continue trimethoprim 100 mg daily   Return in about 1 week (around 06/24/2023) for for recheck on self cathing .  These notes generated with voice recognition software. I apologize for typographical errors.  Cloretta Ned  [facility] [address]  [address] [place], [place]  [address] [phone]  I spent 30 minutes on the day of the encounter to include pre-visit record review, face-to-face time with the patient, and post-visit ordering of tests.

## 2023-06-17 ENCOUNTER — Encounter: Payer: Self-pay | Admitting: Urology

## 2023-06-17 ENCOUNTER — Ambulatory Visit (INDEPENDENT_AMBULATORY_CARE_PROVIDER_SITE_OTHER): Payer: Medicare Other | Admitting: Urology

## 2023-06-17 VITALS — BP 118/79 | HR 68 | Ht 62.0 in | Wt 128.0 lb

## 2023-06-17 DIAGNOSIS — N390 Urinary tract infection, site not specified: Secondary | ICD-10-CM | POA: Diagnosis not present

## 2023-06-17 DIAGNOSIS — N319 Neuromuscular dysfunction of bladder, unspecified: Secondary | ICD-10-CM

## 2023-06-21 NOTE — Telephone Encounter (Signed)
 Pt reports that she has not been contacted by [facility] , she is asking if her referral can be sent somewhere else.

## 2023-06-21 NOTE — Telephone Encounter (Signed)
 Phone room: please find out where she would like referral placed to?

## 2023-06-22 NOTE — Telephone Encounter (Signed)
 Lvm 1st attempt. If pt calls back:  Tell her I placed referral to [facility] in [place] located at: [facility] [address] [place], [place] [address]

## 2023-06-22 NOTE — Telephone Encounter (Addendum)
 Dr. Terrace Arabia,  Can you send referral to [facility] ? Thanks,  [employer] to sent refer there Levert Feinstein. M.D. Ph.D.

## 2023-06-22 NOTE — Addendum Note (Signed)
 Addended by: Danne Harbor on: 06/22/2023 04:39 PM   Modules accepted: Orders

## 2023-06-22 NOTE — Telephone Encounter (Signed)
 Pt called and I clarified that we sent referral to [facility] and she voiced gratitude and understanding

## 2023-06-23 ENCOUNTER — Telehealth: Payer: Self-pay | Admitting: Neurology

## 2023-06-23 ENCOUNTER — Encounter: Payer: Self-pay | Admitting: Urology

## 2023-06-23 NOTE — Progress Notes (Unsigned)
 06/24/2023 9:22 AM   [name] 1960-10-29 [id number]  Referring provider: Enid Baas, MD [address] [place],  [place] [address]  Urological history: 1. Neurogenic bladder -contributing factors of age and MS  2. rUTI's  -Contributing factors of incomplete bladder emptying, MS, incontinence and nonambulatory status -Documented urine cultures over the last year  None -Trimethoprim 100 mg daily  No chief complaint on file.  HPI: [name] is a 63 y.o. female who presents today to be instructed in CIC with her SO, Reuel Boom.   Previous records reviewed.   She is having a very bothersome issue of having severe incontinence when she is picked up for transfer, making it uncomfortable for her to engage in social outings.  We will go ahead and see if CIC prior to outings will help decrease the amount of incontinence she experiences.  PMH: Past Medical History:  Diagnosis Date   Bilateral pulmonary embolism (HCC)    Chronic constipation    Family history of pancreatic cancer    Frequent falls    GERD (gastroesophageal reflux disease)    Hiatal hernia    Multiple sclerosis (HCC)    Pulmonary hemorrhage    RLS (restless legs syndrome)    Sepsis due to pneumonia (HCC)    Tachycardia     Surgical History: Past Surgical History:  Procedure Laterality Date   ESOPHAGOGASTRODUODENOSCOPY (EGD) WITH PROPOFOL N/A 09/11/2021   Procedure: ESOPHAGOGASTRODUODENOSCOPY (EGD) WITH PROPOFOL;  Surgeon: Jaynie Collins, DO;  Location: [facility] ENDOSCOPY;  Service: Gastroenterology;  Laterality: N/A;   PULMONARY THROMBECTOMY Bilateral 02/25/2022   Procedure: PULMONARY THROMBECTOMY;  Surgeon: Annice Needy, MD;  Location: [facility] INVASIVE CV LAB;  Service: Cardiovascular;  Laterality: Bilateral;   TUBAL LIGATION      Home Medications:  Allergies as of 06/24/2023       Reactions   Erythromycin Hives, Nausea And Vomiting   Hydrocodone-acetaminophen Other  (See Comments)   Paranoid and depressed    Lexapro [escitalopram Oxalate] Hives        Medication List        Accurate as of June 23, 2023  9:22 AM. If you have any questions, ask your nurse or doctor.          ascorbic acid 500 MG tablet Commonly known as: VITAMIN C Take 500 mg by mouth daily.   Cholecalciferol 50 MCG (2000 UT) Caps Take 2,000 Units by mouth daily.   dalfampridine 10 MG Tb12 Take 1 tablet by mouth every 12 (twelve) hours.   DULoxetine 30 MG capsule Commonly known as: CYMBALTA Take 30 mg by mouth daily.   gabapentin 800 MG tablet Commonly known as: NEURONTIN Take 400-800 mg by mouth 2 (two) times daily.   methocarbamol 750 MG tablet Commonly known as: ROBAXIN Take by mouth.   midodrine 5 MG tablet Commonly known as: PROAMATINE Take 1 tablet (5 mg total) by mouth 2 (two) times daily with a meal.   omeprazole 20 MG capsule Commonly known as: PRILOSEC Take 20 mg by mouth daily.   pregabalin 75 MG capsule Commonly known as: LYRICA Take 75 mg by mouth 3 (three) times daily.   propranolol 20 MG tablet Commonly known as: INDERAL Take 20 mg by mouth 2 (two) times daily.   rivaroxaban 20 MG Tabs tablet Commonly known as: XARELTO Take 1 tablet (20 mg total) by mouth daily with supper.   traMADol 50 MG tablet Commonly known as: ULTRAM Take 50 mg by mouth 2 (two) times  daily as needed.   traZODone 50 MG tablet Commonly known as: DESYREL Take 50 mg by mouth at bedtime.   trimethoprim 100 MG tablet Commonly known as: TRIMPEX Take 100 mg by mouth daily.        Allergies:  Allergies  Allergen Reactions   Erythromycin Hives and Nausea And Vomiting   Hydrocodone-Acetaminophen Other (See Comments)    Paranoid and depressed    Lexapro [Escitalopram Oxalate] Hives    Family History: No family history on file.  Social History:  reports that she has never smoked. She has never used smokeless tobacco. She reports that she does not drink  alcohol and does not use drugs.  ROS: Pertinent ROS in HPI  Physical Exam: There were no vitals taken for this visit.  Constitutional:  Well nourished. Alert and oriented, No acute distress. HEENT: [place] AT, moist mucus membranes.  Trachea midline Cardiovascular: No clubbing, cyanosis, or edema. Respiratory: Normal respiratory effort, no increased work of breathing. GU: No CVA tenderness.  No bladder fullness or masses.  Recession of labia minora, dry, pale vulvar vaginal mucosa and loss of mucosal ridges and folds.  Normal urethral meatus, no lesions, no prolapse, no discharge.   No urethral masses, tenderness and/or tenderness. Neurologic: Grossly intact, no focal deficits, moving all 4 extremities. Psychiatric: Normal mood and affect.    Laboratory Data: Lab Results  Component Value Date   WBC 7.4 03/05/2023   HGB 14.1 03/05/2023   HCT 45.1 03/05/2023   MCV 88.3 03/05/2023   PLT 197 03/05/2023  I have reviewed the labs.   Pertinent Imaging: N/A   Assessment & Plan:    1. Neurogenic bladder -I had her and Reuel Boom watched the self catheterization video.  We then attempted to straight catheter while she was sitting in her chair, but her leg contractions made this almost a possible.  We then laid her on her side in a fetal position, so that we can access her urethra from below.  I was able to pass a female Luja catheter easily into her bladder and drained 300 cc.  Reuel Boom was given samples of the Luje and other catheters to see which ones will work best for him.    2. rUTI's -continue trimethoprim 100 mg daily   No follow-ups on file.  These notes generated with voice recognition software. I apologize for typographical errors.  Cloretta Ned  [facility] [address]  [address] [place], [place] [address] [phone]

## 2023-06-23 NOTE — Telephone Encounter (Signed)
 Orthopedics referral faxed to [facility] (fax# [phone], phone# [phone])

## 2023-06-24 ENCOUNTER — Ambulatory Visit (INDEPENDENT_AMBULATORY_CARE_PROVIDER_SITE_OTHER): Admitting: Urology

## 2023-06-24 ENCOUNTER — Encounter: Payer: Self-pay | Admitting: Urology

## 2023-06-24 VITALS — BP 113/66 | HR 65

## 2023-06-24 DIAGNOSIS — N319 Neuromuscular dysfunction of bladder, unspecified: Secondary | ICD-10-CM

## 2023-06-24 DIAGNOSIS — N390 Urinary tract infection, site not specified: Secondary | ICD-10-CM | POA: Diagnosis not present

## 2023-06-24 DIAGNOSIS — N952 Postmenopausal atrophic vaginitis: Secondary | ICD-10-CM

## 2023-06-24 MED ORDER — LIDOCAINE VISCOUS HCL 2 % MT SOLN: 15.0000 mL | OROMUCOSAL | 0 refills | Status: AC | PRN

## 2023-06-24 MED ORDER — ESTRADIOL 0.1 MG/GM VA CREA: TOPICAL_CREAM | VAGINAL | 12 refills | Status: AC

## 2023-06-29 NOTE — Progress Notes (Signed)
 07/01/2023 7:29 PM   [name] 03-01-1961 [id number]  Referring provider: Enid Baas, MD [address] [place],  [place] [address]  Urological history: 1. Neurogenic bladder -contributing factors of age and MS  2. rUTI's  -Contributing factors of incomplete bladder emptying, MS, incontinence and nonambulatory status -Documented urine cultures over the last year  None -Trimethoprim 100 mg daily  Chief Complaint  Patient presents with   Urinary Retention   HPI: [name] is a 63 y.o. female who reached out by phone with her SO, Reuel Boom.   Previous records reviewed.   She states that the cathing is going better.  Reuel Boom is getting more skilled and it is less painful.   She still has issues with incontinence.  He is only cathing her once daily.    Patient denies any modifying or aggravating factors.  Patient denies any recent UTI's, gross hematuria, dysuria or suprapubic/flank pain.  Patient denies any fevers, chills, nausea or vomiting.    PMH: Past Medical History:  Diagnosis Date   Bilateral pulmonary embolism (HCC)    Chronic constipation    Family history of pancreatic cancer    Frequent falls    GERD (gastroesophageal reflux disease)    Hiatal hernia    Multiple sclerosis (HCC)    Pulmonary hemorrhage    RLS (restless legs syndrome)    Sepsis due to pneumonia (HCC)    Tachycardia     Surgical History: Past Surgical History:  Procedure Laterality Date   ESOPHAGOGASTRODUODENOSCOPY (EGD) WITH PROPOFOL N/A 09/11/2021   Procedure: ESOPHAGOGASTRODUODENOSCOPY (EGD) WITH PROPOFOL;  Surgeon: Jaynie Collins, DO;  Location: [facility] ENDOSCOPY;  Service: Gastroenterology;  Laterality: N/A;   PULMONARY THROMBECTOMY Bilateral 02/25/2022   Procedure: PULMONARY THROMBECTOMY;  Surgeon: Annice Needy, MD;  Location: [facility] INVASIVE CV LAB;  Service: Cardiovascular;  Laterality: Bilateral;   TUBAL LIGATION      Home Medications:   Allergies as of 07/01/2023       Reactions   Erythromycin Hives, Nausea And Vomiting   Hydrocodone-acetaminophen Other (See Comments)   Paranoid and depressed    Lexapro [escitalopram Oxalate] Hives        Medication List        Accurate as of July 01, 2023 11:59 PM. If you have any questions, ask your nurse or doctor.          ascorbic acid 500 MG tablet Commonly known as: VITAMIN C Take 500 mg by mouth daily.   Cholecalciferol 50 MCG (2000 UT) Caps Take 2,000 Units by mouth daily.   dalfampridine 10 MG Tb12 Take 1 tablet by mouth every 12 (twelve) hours.   DULoxetine 30 MG capsule Commonly known as: CYMBALTA Take 30 mg by mouth daily.   estradiol 0.1 MG/GM vaginal cream Commonly known as: ESTRACE VAGINAL Apply 0.5mg  (pea-sized amount)  just inside the vaginal introitus with a finger-tip on Monday, Wednesday and Friday nights.   gabapentin 800 MG tablet Commonly known as: NEURONTIN Take 400-800 mg by mouth 2 (two) times daily.   lidocaine 2 % solution Commonly known as: XYLOCAINE Use as directed 15 mLs in the mouth or throat as needed for mouth pain.   midodrine 5 MG tablet Commonly known as: PROAMATINE Take 1 tablet (5 mg total) by mouth 2 (two) times daily with a meal.   omeprazole 20 MG capsule Commonly known as: PRILOSEC Take 20 mg by mouth daily.   pregabalin 75 MG capsule Commonly known as: LYRICA Take 75 mg by  mouth 3 (three) times daily.   propranolol 20 MG tablet Commonly known as: INDERAL Take 20 mg by mouth 2 (two) times daily.   rivaroxaban 20 MG Tabs tablet Commonly known as: XARELTO Take 1 tablet (20 mg total) by mouth daily with supper.   traMADol 50 MG tablet Commonly known as: ULTRAM Take 50 mg by mouth 2 (two) times daily as needed.   traZODone 50 MG tablet Commonly known as: DESYREL Take 50 mg by mouth at bedtime.   trimethoprim 100 MG tablet Commonly known as: TRIMPEX Take 100 mg by mouth daily.         Allergies:  Allergies  Allergen Reactions   Erythromycin Hives and Nausea And Vomiting   Hydrocodone-Acetaminophen Other (See Comments)    Paranoid and depressed    Lexapro [Escitalopram Oxalate] Hives    Family History: No family history on file.  Social History:  reports that she has never smoked. She has never used smokeless tobacco. She reports that she does not drink alcohol and does not use drugs.  ROS: Pertinent ROS in HPI   Laboratory Data: N/A  Pertinent Imaging: N/A   Assessment & Plan:    1. Neurogenic bladder -Reuel Boom is having better success with straight cathing her, but the incontinence is still occurring, but he is only cathing her once daily -They will attempt to increase the amount of cathing and contact me next week with her progress  2. rUTI's -continue trimethoprim 100 mg daily   3. Vaginal atrophy -Start vaginal estrogen cream 3 nights weekly  Return in about 1 week (around 07/08/2023) for she will reach out via MyChart .  These notes generated with voice recognition software. I apologize for typographical errors.  Cloretta Ned  [facility] [address]  [address] [place], [place] [address] [phone]

## 2023-07-01 ENCOUNTER — Ambulatory Visit (INDEPENDENT_AMBULATORY_CARE_PROVIDER_SITE_OTHER): Admitting: Urology

## 2023-07-01 ENCOUNTER — Telehealth: Payer: Self-pay | Admitting: Radiology

## 2023-07-01 DIAGNOSIS — N319 Neuromuscular dysfunction of bladder, unspecified: Secondary | ICD-10-CM

## 2023-07-01 NOTE — Telephone Encounter (Signed)
 Patient called [place] office, I advised I can sched her appt in [place] or [place], as she lives in [place].  I called, NA, LMVM to call me back.

## 2023-07-15 NOTE — Telephone Encounter (Signed)
 Pt left another message about her catheters.  She said she hasn't heard anything from our office and wants to know if catheters have been ordered.

## 2023-07-19 ENCOUNTER — Telehealth: Payer: Self-pay

## 2023-07-19 DIAGNOSIS — G8929 Other chronic pain: Secondary | ICD-10-CM

## 2023-07-19 NOTE — Telephone Encounter (Signed)
 Called and advised her to go to [facility] outpatient Imaging to have her x rays before her appointment on Thursday and she agreed to go

## 2023-07-21 ENCOUNTER — Telehealth: Payer: Self-pay

## 2023-07-21 ENCOUNTER — Ambulatory Visit: Admission: RE | Admit: 2023-07-21 | Source: Home / Self Care

## 2023-07-21 ENCOUNTER — Ambulatory Visit
Admission: RE | Admit: 2023-07-21 | Discharge: 2023-07-21 | Disposition: A | Discharge status: Home / Self Care | Source: Ambulatory Visit | Attending: Orthopedic Surgery

## 2023-07-21 ENCOUNTER — Ambulatory Visit
Admission: RE | Admit: 2023-07-21 | Discharge: 2023-07-21 | Disposition: A | Discharge status: Home / Self Care | Source: Ambulatory Visit | Attending: Orthopedic Surgery | Admitting: Orthopedic Surgery

## 2023-07-21 ENCOUNTER — Ambulatory Visit
Admission: RE | Admit: 2023-07-21 | Discharge: 2023-07-21 | Disposition: A | Discharge status: Home / Self Care | Attending: Orthopedic Surgery | Admitting: Orthopedic Surgery

## 2023-07-21 DIAGNOSIS — M25562 Pain in left knee: Secondary | ICD-10-CM | POA: Insufficient documentation

## 2023-07-21 DIAGNOSIS — G8929 Other chronic pain: Secondary | ICD-10-CM

## 2023-07-21 DIAGNOSIS — M25561 Pain in right knee: Secondary | ICD-10-CM | POA: Insufficient documentation

## 2023-07-22 ENCOUNTER — Ambulatory Visit: Admitting: Orthopedic Surgery

## 2023-07-22 DIAGNOSIS — M25561 Pain in right knee: Secondary | ICD-10-CM

## 2023-07-22 DIAGNOSIS — G8929 Other chronic pain: Secondary | ICD-10-CM

## 2023-07-22 DIAGNOSIS — M25562 Pain in left knee: Secondary | ICD-10-CM | POA: Diagnosis not present

## 2023-07-23 ENCOUNTER — Encounter: Payer: Self-pay | Admitting: Orthopedic Surgery

## 2023-07-23 NOTE — Progress Notes (Signed)
 New Patient Visit  Assessment: [name] is a 63 y.o. female with the following: 1. Chronic pain of right knee 2. Chronic pain of left knee  Plan: [name] has MS.  She is confined to a wheelchair.  She has not walked in for almost 2 years.  She is working with therapy, and is receiving Botox injections, to try and improve the spasticity.  Unfortunate, there is nothing further that I can add.  Given the underlying condition, as well as the chronicity of her symptoms, it is very difficult.  If we were to discuss a tablet tendon release, it could affect her ability to walk.  This was discussed with the patient.  She states understanding.  Follow-up: Return if symptoms worsen or fail to improve.  Subjective:  Chief Complaint  Patient presents with   Knee Pain    Pain in bilat knees     History of Present Illness: [name] is a 63 y.o. female who has been referred by  Levert Feinstein, MD for evaluation of bilateral knee pain.  She has underlying diagnosis of MS.  She has been treated for this previously.  Her symptoms were stable.  She was hospitalized a couple of years ago with COVID.  Since then, she has had a lot of spasticity in her lower extremities.  She has been confined to a wheelchair.  She has not walked in a couple of years.  She is working with therapy.  She is getting Botox injections.  She notes that she has a lot of pain with attempted stretching.  She was referred to clinic today to discuss potential treatment options to improve her ability to walk again.   Review of Systems: No fevers or chills No numbness or tingling No chest pain No shortness of breath No bowel or bladder dysfunction No GI distress No headaches   Medical History:  Past Medical History:  Diagnosis Date   Bilateral pulmonary embolism (HCC)    Chronic constipation    Family history of pancreatic cancer    Frequent falls    GERD (gastroesophageal reflux disease)     Hiatal hernia    Multiple sclerosis (HCC)    Pulmonary hemorrhage    RLS (restless legs syndrome)    Sepsis due to pneumonia (HCC)    Tachycardia     Past Surgical History:  Procedure Laterality Date   ESOPHAGOGASTRODUODENOSCOPY (EGD) WITH PROPOFOL N/A 09/11/2021   Procedure: ESOPHAGOGASTRODUODENOSCOPY (EGD) WITH PROPOFOL;  Surgeon: Jaynie Collins, DO;  Location: [facility] ENDOSCOPY;  Service: Gastroenterology;  Laterality: N/A;   PULMONARY THROMBECTOMY Bilateral 02/25/2022   Procedure: PULMONARY THROMBECTOMY;  Surgeon: Annice Needy, MD;  Location: [facility] INVASIVE CV LAB;  Service: Cardiovascular;  Laterality: Bilateral;   TUBAL LIGATION      No family history on file. Social History   Tobacco Use   Smoking status: Never   Smokeless tobacco: Never  Vaping Use   Vaping status: Never Used  Substance Use Topics   Alcohol use: Never   Drug use: Never    Allergies  Allergen Reactions   Erythromycin Hives and Nausea And Vomiting   Hydrocodone-Acetaminophen Other (See Comments)    Paranoid and depressed    Lexapro [Escitalopram Oxalate] Hives    Current Meds  Medication Sig   ascorbic acid (VITAMIN C) 500 MG tablet Take 500 mg by mouth daily.   Cholecalciferol 50 MCG (2000 UT) CAPS Take 2,000 Units by mouth daily.   dalfampridine 10 MG TB12 Take 1  tablet by mouth every 12 (twelve) hours.   DULoxetine (CYMBALTA) 30 MG capsule Take 30 mg by mouth daily.   estradiol (ESTRACE VAGINAL) 0.1 MG/GM vaginal cream Apply 0.5mg  (pea-sized amount)  just inside the vaginal introitus with a finger-tip on Monday, Wednesday and Friday nights.   gabapentin (NEURONTIN) 800 MG tablet Take 400-800 mg by mouth 2 (two) times daily.   lidocaine (XYLOCAINE) 2 % solution Use as directed 15 mLs in the mouth or throat as needed for mouth pain.   midodrine (PROAMATINE) 5 MG tablet Take 1 tablet (5 mg total) by mouth 2 (two) times daily with a meal.   omeprazole (PRILOSEC) 20 MG capsule Take 20 mg by  mouth daily.   pregabalin (LYRICA) 75 MG capsule Take 75 mg by mouth 3 (three) times daily.   propranolol (INDERAL) 20 MG tablet Take 20 mg by mouth 2 (two) times daily.   rivaroxaban (XARELTO) 20 MG TABS tablet Take 1 tablet (20 mg total) by mouth daily with supper.   traMADol (ULTRAM) 50 MG tablet Take 50 mg by mouth 2 (two) times daily as needed.   traZODone (DESYREL) 50 MG tablet Take 50 mg by mouth at bedtime.   trimethoprim (TRIMPEX) 100 MG tablet Take 100 mg by mouth daily.    Objective: There were no vitals taken for this visit.  Physical Exam:  General: Alert and oriented. and No acute distress. Gait: Unable to ambulate.  Evaluation of bilateral knees demonstrates no swelling.  She has a lot of spasticity.  She is comfortable 90 degrees.  She has tightness in the adductor's bilaterally.  Unable to get beyond 45 degrees of extension due to spasticity.  No point tenderness.  IMAGING: I personally ordered and reviewed the following images   Limited views of bilateral knees were obtained.  No acute injuries.  Poor bone quality overall.   New Medications:  No orders of the defined types were placed in this encounter.     Oliver Barre, MD  07/23/2023 9:38 AM

## 2023-08-03 ENCOUNTER — Ambulatory Visit (INDEPENDENT_AMBULATORY_CARE_PROVIDER_SITE_OTHER): Payer: Medicare Other | Admitting: Neurology

## 2023-08-03 DIAGNOSIS — G8220 Paraplegia, unspecified: Secondary | ICD-10-CM

## 2023-08-03 DIAGNOSIS — G35 Multiple sclerosis: Secondary | ICD-10-CM

## 2023-08-03 MED ORDER — INCOBOTULINUMTOXINA 100 UNITS IM SOLR
600.0000 [IU] | Freq: Once | INTRAMUSCULAR | Status: AC
  Administered 2023-08-03: 600 [IU] via INTRAMUSCULAR

## 2023-08-03 NOTE — Progress Notes (Signed)
 Chief Complaint  Patient presents with   Injections    Pt in 14,  Pt is here for Xeomin  injections for spastic paraplegia.       ASSESSMENT ANDp [name] is a 63 y.o. female   Secondary progressive multiple sclerosis Spastic paraplegia  Significant bilateral knee flex contraction, limited range of motion even with passive stretch, painful, tightness of bilateral hamstring tendon,  Electrical stimulation guided xeomin  injection, used 600 units, (100 units of xeomin  was dissolved into to 2 cc of normal saline)  Right semimembranosus 50 units Right semitendinosus 100 units Right biceps femoris long head 100 units Right biceps femoris short head 50 units  Left semimembranosus 50 units Left semitendinosis 50 units Left biceps femoris long head 50 units Right biceps femoris short head 50 units Right adductor longus 100 units   DIAGNOSTIC DATA (LABS, IMAGING, TESTING) - I reviewed patient records, labs, notes, testing and imaging myself where available.   MEDICAL HISTORY:  [name], is a 63 year old female, accompanied by her boyfriend seen in request by  neurologist Potter, Zachary E, at [facility] for evaluation of botulism toxin injection for spastic bilateral lower extremity, her primary care physician is Dr. Primus Brookes, Jorge Newcomer, initial evaluation was on March 30, 2023  History is obtained from the patient and review of electronic medical records. I personally reviewed pertinent available imaging films in PACS.   PMHx of  Restless leg syndrome Urinary incontinence. GERD DVT-PE during COVID in 2023,  Depression, Chronic insominia Neuropathic pain,  She was diagnosed with relapsing remitting multiple sclerosis around 1998, presenting with gait abnormality also complains of hand clumsiness, paresthesia, she was diagnosed and treated at [place]  until she moved to [place]  in 2021, she was treated with injectable  Avonex, from 1998-2011, then switched to Gilenya from 2011-2021, now receiving Ocrevus at home infusions since 2021   At baseline prior to her COVID infection in November 2023, she was able to ambulate with walker, doing basic house chores, she suffered significant setback since her COVID infection in Nov 2023, she had bilateral lower extremity DVT, pulmonary emboli, had pulmonary thrombectomy, massive amount of clot removed,acute hypoxic respiratory failure, bilateral pneumonia, pulmonary hemorrhage, then hospital admission in January 2024 for sepsis secondary to UTI, sacral cellulitis,  Since above severe medical issues, she spent lengthy time in her bed, despite rehabilitation, she has developed severe contraction of bilateral lower extremity, now wheelchair-bound, difficult to straighten out her lower extremity, could not bear weight, needing help in transportation,  She is hoping to receive botulism toxin injection to relax bilateral lower extremity contraction, help weightbearing and transfer  Today's examination showed painful lower extremity fixed knee flexion, limited range of motion, tight bilateral hamstring muscles  UPDATE May 05 2023: This is her first electrical stimulation guided xeomin  injection for spastic bilateral lower extremity, used xeomin  600 units, potential side effect explained, consent form was signed,  UPDATE August 03 2023: She has mild improvement of her lower extremity spasticity after injection, was seen by orthopedic surgeon, deemed not a candidate for any intervention,  PHYSICAL EXAM:      06/24/2023    2:58 PM 06/17/2023    3:14 PM 06/08/2023    2:29 PM  Vitals with BMI  Height  5\' 2"    Weight  128 lbs   BMI  23.41   Systolic 113 118 191  Diastolic 66 79 75  Pulse 65 68 68     PHYSICAL EXAMNIATION:  Gen: NAD, conversant, well  nourised, well groomed                     Cardiovascular: Regular rate rhythm, no peripheral edema, warm, nontender. Eyes:  Conjunctivae clear without exudates or hemorrhage Neck: Supple, no carotid bruits. Pulmonary: Clear to auscultation bilaterally   NEUROLOGICAL EXAM:  MENTAL STATUS: Speech/cognition: Awake, alert, oriented to history taking and casual conversation CRANIAL NERVES: CN II: Visual fields are full to confrontation. Pupils are round equal and briskly reactive to light. CN III, IV, VI: extraocular movement are normal. No ptosis. CN V: Facial sensation is intact to light touch CN VII: Face is symmetric with normal eye closure  CN VIII: Hearing is normal to causal conversation. CN IX, X: Phonation is normal. CN XI: Head turning and shoulder shrug are intact  MOTOR: Mild bilateral shoulder abduction, grip weakness, left worse than right, mild fixation of left upper extremity on rapid rotating movement.  Fixed contraction of bilateral lower extremity, bilateral hip adduction, even with passive stretch, maximum right knee was about 100, left was 110 degree, complains of bilateral hamstring muscle and around knee tendon pain, tendency for right knee adductor towards the left side,  REFLEXES: Hypoactive  SENSORY: Length-dependent sensory changes  COORDINATION: There is no trunk or limb dysmetria noted.  GAIT/STANCE: Wheelchair-bound  REVIEW OF SYSTEMS:  Full 14 system review of systems performed and notable only for as above All other review of systems were negative.   ALLERGIES: Allergies  Allergen Reactions   Erythromycin Hives and Nausea And Vomiting   Hydrocodone-Acetaminophen  Other (See Comments)    Paranoid and depressed    Lexapro [Escitalopram Oxalate] Hives    HOME MEDICATIONS: Current Outpatient Medications  Medication Sig Dispense Refill   ascorbic acid (VITAMIN C) 500 MG tablet Take 500 mg by mouth daily.     Cholecalciferol 50 MCG (2000 UT) CAPS Take 2,000 Units by mouth daily.     dalfampridine  10 MG TB12 Take 1 tablet by mouth every 12 (twelve) hours.      DULoxetine  (CYMBALTA ) 30 MG capsule Take 30 mg by mouth daily.     estradiol  (ESTRACE  VAGINAL) 0.1 MG/GM vaginal cream Apply 0.5mg  (pea-sized amount)  just inside the vaginal introitus with a finger-tip on Monday, Wednesday and Friday nights. 30 g 12   gabapentin  (NEURONTIN ) 800 MG tablet Take 400-800 mg by mouth 2 (two) times daily.     lidocaine  (XYLOCAINE ) 2 % solution Use as directed 15 mLs in the mouth or throat as needed for mouth pain. 100 mL 0   midodrine  (PROAMATINE ) 5 MG tablet Take 1 tablet (5 mg total) by mouth 2 (two) times daily with a meal. 14 tablet 0   omeprazole (PRILOSEC) 20 MG capsule Take 20 mg by mouth daily.     pregabalin  (LYRICA ) 75 MG capsule Take 75 mg by mouth 3 (three) times daily.     propranolol  (INDERAL ) 20 MG tablet Take 20 mg by mouth 2 (two) times daily.     rivaroxaban  (XARELTO ) 20 MG TABS tablet Take 1 tablet (20 mg total) by mouth daily with supper. 30 tablet 0   traMADol  (ULTRAM ) 50 MG tablet Take 50 mg by mouth 2 (two) times daily as needed.     traZODone  (DESYREL ) 50 MG tablet Take 50 mg by mouth at bedtime.     trimethoprim  (TRIMPEX ) 100 MG tablet Take 100 mg by mouth daily.     Current Facility-Administered Medications  Medication Dose Route Frequency Provider Last Rate Last Admin  incobotulinumtoxinA  (XEOMIN ) 100 units injection 600 Units  600 Units Intramuscular Once Phebe Brasil, MD        PAST MEDICAL HISTORY: Past Medical History:  Diagnosis Date   Bilateral pulmonary embolism (HCC)    Chronic constipation    Family history of pancreatic cancer    Frequent falls    GERD (gastroesophageal reflux disease)    Hiatal hernia    Multiple sclerosis (HCC)    Pulmonary hemorrhage    RLS (restless legs syndrome)    Sepsis due to pneumonia (HCC)    Tachycardia     PAST SURGICAL HISTORY: Past Surgical History:  Procedure Laterality Date   ESOPHAGOGASTRODUODENOSCOPY (EGD) WITH PROPOFOL  N/A 09/11/2021   Procedure: ESOPHAGOGASTRODUODENOSCOPY (EGD)  WITH PROPOFOL ;  Surgeon: Quintin Buckle, DO;  Location: [facility] ENDOSCOPY;  Service: Gastroenterology;  Laterality: N/A;   PULMONARY THROMBECTOMY Bilateral 02/25/2022   Procedure: PULMONARY THROMBECTOMY;  Surgeon: Celso College, MD;  Location: [facility] INVASIVE CV LAB;  Service: Cardiovascular;  Laterality: Bilateral;   TUBAL LIGATION      FAMILY HISTORY: No family history on file.  SOCIAL HISTORY: Social History   Socioeconomic History   Marital status: Widowed    Spouse name: Not on file   Number of children: Not on file   Years of education: Not on file   Highest education level: Not on file  Occupational History   Not on file  Tobacco Use   Smoking status: Never   Smokeless tobacco: Never  Vaping Use   Vaping status: Never Used  Substance and Sexual Activity   Alcohol use: Never   Drug use: Never   Sexual activity: Not Currently  Other Topics Concern   Not on file  Social History Narrative   Not on file   Social Drivers of Health   Financial Resource Strain: Low Risk  (01/19/2023)   Received from [facility]   Overall Financial Resource Strain (CARDIA)    Difficulty of Paying Living Expenses: Not hard at all  Food Insecurity: No Food Insecurity (01/19/2023)   Received from [facility]   Hunger Vital Sign    Worried About Running Out of Food in the Last Year: Never true    Ran Out of Food in the Last Year: Never true  Transportation Needs: No Transportation Needs (01/19/2023)   Received from [facility]   PRAPARE - Transportation    Lack of Transportation (Non-Medical): No    In the past 12 months, has lack of transportation kept you from medical appointments or from getting medications?: No  Physical Activity: Not on file  Stress: Not on file  Social Connections: Not on file  Intimate Partner Violence: Not At Risk (04/17/2022)   Humiliation, Afraid, Rape, and Kick questionnaire    Fear of Current or Ex-Partner:  No    Emotionally Abused: No    Physically Abused: No    Sexually Abused: No      Phebe Brasil, M.D. Ph.D.  [facility] [address], [address] [place], [place] [address] Ph: [phone] Fax: [phone]  CC:  Rex Castor, MD [address] [place],  [place] [address]  Rex Castor, MD

## 2023-08-03 NOTE — Progress Notes (Signed)
 xeomin  100 units x 6  vial  Ndc-0259-1610-01 [id number] x 6 Exp-11/2025 B/B  Bacteriostatic 0.9% Sodium Chloride - 12 mL  HQI:[id number] Expiration: 02/12/2024 NDC: 2841-3244-01 Dx: U27.25 WITNESSED DG:UYQIH J RN

## 2023-08-09 NOTE — Telephone Encounter (Signed)
 [name]

## 2023-08-31 ENCOUNTER — Encounter (INDEPENDENT_AMBULATORY_CARE_PROVIDER_SITE_OTHER): Payer: Self-pay

## 2023-09-25 ENCOUNTER — Ambulatory Visit (INDEPENDENT_AMBULATORY_CARE_PROVIDER_SITE_OTHER)

## 2023-09-25 ENCOUNTER — Ambulatory Visit
Admission: EM | Admit: 2023-09-25 | Discharge: 2023-09-25 | Disposition: A | Discharge status: Home / Self Care | Attending: Emergency Medicine | Admitting: Emergency Medicine

## 2023-09-25 ENCOUNTER — Encounter: Payer: Self-pay | Admitting: Emergency Medicine

## 2023-09-25 DIAGNOSIS — S20229A Contusion of unspecified back wall of thorax, initial encounter: Secondary | ICD-10-CM

## 2023-09-25 DIAGNOSIS — M545 Low back pain, unspecified: Secondary | ICD-10-CM

## 2023-09-25 DIAGNOSIS — M546 Pain in thoracic spine: Secondary | ICD-10-CM | POA: Diagnosis not present

## 2023-09-25 NOTE — ED Triage Notes (Signed)
 Patient states that she fell off a [place]oyer lift on Saturday.  Patient reports ongoing back pain for a week.

## 2023-09-25 NOTE — Discharge Instructions (Addendum)
 Your x-rays did not show any broken or dislocated bones.  I do believe you have bruised your back as a result of your recent fall from your [place] lift.  You may supplement your tramadol  and Lyrica  with over-the-counter Tylenol .  Take it according the package instructions.  You may also apply over-the-counter Salonpas or lidocaine  patches to your back directly to help with pain.  Each patch is good for 8 hours.  Additionally, you may apply ice, or moist heat, to your back for 20-minute at a time, 2-3 times a day, to help with pain and inflammation.  If your pain does not improve I would recommend that you follow-up with your primary care provider or with orthopedics.

## 2023-09-25 NOTE — ED Provider Notes (Addendum)
 [facility]    CSN: [id number] Arrival date & time: 09/25/23  1330      History   Chief Complaint Chief Complaint  Patient presents with   Back Pain    HPI [name] is a 63 y.o. female.   HPI  63 year old female with past medical history significant for multiple sclerosis, GERD, RLS, decubitus ulcer sacral region, and bilateral PE presents for evaluation of thoracic and lumbar back pain.  She reports that she fell from a [place] lift that was approximately 4 feet in the air a week ago.  She was seen in the emergency department where she had a CT scan of her head and neck performed as well as a staple repair of a laceration to the back of her head.  Later she developed pain in her mid and upper low back.  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) 03/30/2023   Anemia 06/05/2022   Hypotension 04/18/2022   Acute UTI 04/17/2022   Decubitus ulcer of sacral region, stage 4 (HCC) 04/17/2022   Severe sepsis (HCC) 04/16/2022   Aspiration into airway 04/16/2022   Leukocytosis 03/13/2022   FUO (fever of unknown origin) 03/13/2022   Sepsis (HCC) 03/02/2022   Acute hypoxic respiratory failure (HCC) 03/02/2022   Bilateral pneumonia 03/02/2022   Pulmonary hemorrhage 03/02/2022   Bilateral pulmonary embolism (HCC) 02/24/2022   Tachycardia 02/24/2022   Multiple sclerosis (HCC) 06/26/2021   Abdominal pain, LUQ (left upper quadrant) 06/10/2021   Chronic constipation 06/10/2021   Family history of pancreatic cancer 06/10/2021   Gastroesophageal  reflux disease 06/10/2021   Low back pain 12/06/2020   Numbness 09/26/2020   Falls frequently 04/18/2020   Right leg pain 04/18/2020   RLS (restless legs syndrome) 04/18/2020   History of multiple sclerosis (HCC) 02/22/2020    Past Surgical History:  Procedure Laterality Date   ESOPHAGOGASTRODUODENOSCOPY (EGD) WITH PROPOFOL  N/A 09/11/2021   Procedure: ESOPHAGOGASTRODUODENOSCOPY (EGD) WITH PROPOFOL ;  Surgeon: Quintin Buckle, DO;  Location: [facility];  Service: Gastroenterology;  Laterality: N/A;   PULMONARY THROMBECTOMY Bilateral 02/25/2022   Procedure: PULMONARY THROMBECTOMY;  Surgeon: Celso College, MD;  Location: [facility] INVASIVE CV LAB;  Service: Cardiovascular;  Laterality: Bilateral;   TUBAL LIGATION      OB History   No obstetric history on file.      Home Medications    Prior to Admission medications   Medication Sig Start Date End Date Taking? Authorizing Provider  ascorbic acid (VITAMIN C) 500 MG tablet Take 500 mg by mouth daily.    [provider]  Cholecalciferol 50 MCG (2000 UT) CAPS Take 2,000 Units by mouth daily.    [provider]  dalfampridine  10 MG TB12 Take 1 tablet by mouth every 12 (twelve) hours. 12/29/21   [provider]  DULoxetine  (CYMBALTA ) 30 MG capsule Take 30 mg by mouth daily. 06/25/21   [provider]  estradiol  (ESTRACE  VAGINAL) 0.1 MG/GM vaginal cream Apply 0.5mg  (pea-sized amount)  just inside  the vaginal introitus with a finger-tip on Monday, Wednesday and Friday nights. 06/24/23   Matilde Son A, PA-C  gabapentin  (NEURONTIN ) 800 MG tablet Take 400-800 mg by mouth 2 (two) times daily. 04/14/22   [provider]  lidocaine  (XYLOCAINE ) 2 % solution Use as directed 15 mLs in the mouth or throat as needed for mouth pain. 06/24/23   Matilde Son A, PA-C  midodrine  (PROAMATINE ) 5 MG tablet Take 1 tablet (5 mg total) by mouth 2 (two) times daily with a meal. 04/21/22   Sheril Dines, MD  omeprazole  (PRILOSEC) 20 MG capsule Take 20 mg by mouth daily.    [provider]  pregabalin  (LYRICA ) 75 MG capsule Take 75 mg by mouth 3 (three) times daily. 08/18/21   [provider]  propranolol  (INDERAL ) 20 MG tablet Take 20 mg by mouth 2 (two) times daily. 04/09/22   [provider]  rivaroxaban  (XARELTO ) 20 MG TABS tablet Take 1 tablet (20 mg total) by mouth daily with supper. 04/21/22   Sheril Dines, MD  traMADol  (ULTRAM ) 50 MG tablet Take 50 mg by mouth 2 (two) times daily as needed. 08/25/22   [provider]  traZODone  (DESYREL ) 50 MG tablet Take 50 mg by mouth at bedtime. 03/31/22   [provider]  trimethoprim  (TRIMPEX ) 100 MG tablet Take 100 mg by mouth daily. 03/02/23   [provider]    Family History History reviewed. No pertinent family history.  Social History Social History   Tobacco Use   Smoking status: Never   Smokeless tobacco: Never  Vaping Use   Vaping status: Never Used  Substance Use Topics   Alcohol use: Never   Drug use: Never     Allergies   Erythromycin, Hydrocodone-acetaminophen , and Lexapro [escitalopram oxalate]   Review of Systems Review of Systems  Musculoskeletal:  Positive for back pain.  Skin:  Negative for color change.     Physical Exam Triage Vital Signs ED Triage Vitals  Encounter Vitals Group     BP      Girls Systolic BP Percentile      Girls Diastolic BP Percentile      Boys Systolic BP Percentile      Boys Diastolic BP Percentile      Pulse      Resp      Temp      Temp src      SpO2      Weight      Height      Head Circumference      Peak Flow      Pain Score      Pain Loc      Pain Education      Exclude from Growth Chart    No data found.  Updated Vital Signs BP 107/70 (BP Location: Right Arm)   Pulse 88   Temp 97.9 F (36.6 C) (Oral)   Resp 15   Ht 5' 2 (1.575 m)   Wt 128 lb 1.4 oz (58.1 kg)   SpO2 92%   BMI 23.43 kg/m   Visual Acuity Right Eye  Distance:   Left Eye Distance:   Bilateral Distance:    Right Eye Near:   Left Eye Near:    Bilateral Near:     Physical Exam Vitals and nursing note reviewed.  Constitutional:      Appearance: Normal appearance. She is not ill-appearing.  HENT:     Head: Normocephalic and atraumatic.   Musculoskeletal:  General: Tenderness and signs of injury present.   Skin:    General: Skin is warm and dry.     Capillary Refill: Capillary refill takes less than 2 seconds.     Findings: No bruising or erythema.   Neurological:     General: No focal deficit present.     Mental Status: She is alert and oriented to person, place, and time.      UC Treatments / Results  Labs (all labs ordered are listed, but only abnormal results are displayed) Labs Reviewed - No data to display  EKG   Radiology No results found.  Procedures Procedures (including critical care time)  Medications Ordered in UC Medications - No data to display  Initial Impression / Assessment and Plan / UC Course  I have reviewed the triage vital signs and the nursing notes.  Pertinent labs & imaging results that were available during my care of the patient were reviewed by me and considered in my medical decision making (see chart for details).   Patient is a pleasant, nontoxic-appearing 63 year old female who is confined to a wheelchair due to MS presenting for evaluation of thoracic and lumbar spine pain.  1 week ago she suffered a fall from a [place] lift for approximately 4 feet in the air.  She was seen at [facility] emergency department in [place]  where she received 4 staples to the back of her head for a head laceration and also had a CT scan of her head.  The CT scan did not show any acute intracranial abnormality.  The patient reports that she later developed the pain in her back.  With her husband's assistance I had him leaned her forward to inspect her back.  She has no edema, ecchymosis, or  erythema.  However, she has spinous process tenderness from the spinous process of T5 to the spinous process of L2.  No crepitus or step-off.  I have advised the patient that we can do plain films though 2-dimensional imaging is limited.  If there is any evidence of fracture I will refer her to the emergency department as she will need a CT scan for better imaging.  However, I will order T and L-spine films.  Lumbar spine x-rays independently reviewed and evaluated by me.  Impression: Degenerative changes present to the vertebral bodies without evidence of fracture.  Disc spaces are well-maintained.  Radiology overread is pending. Radiology impression states age-indeterminate compression deformity through the superior endplate of L3 vertebral body new since 04/16/2022 with less than 25% loss of height.  Thoracic spine x-rays independently reviewed and evaluated by me.  Impression: No evidence of fracture or dislocation.  Degenerative changes noted throughout the thoracic spine.  Radiology overread is pending. Radiology impression states mild right convex scoliosis centered at the thoracolumbar junction without evidence of acute displaced fracture.  Given that patient is not experiencing tenderness at this spot I suspect that this is not related to her recent fall.  I will discharge patient home with a diagnosis of contusion of her thoracic and lumbar spine.  She has tramadol  at home which she has used for the pain and is also prescribed Lyrica .  She may apply a topical Salonpas or lidocaine  patches to help with the discomfort as well as apply ice or moist heat to her thoracic and lumbar spine for 20 minutes at a time, 2-3 times a day, to help with pain and inflammation.   Final Clinical Impressions(s) / UC Diagnoses  Final diagnoses:  Thoracic spine pain  Lumbar spine pain  Contusion of thoracic spine     Discharge Instructions      Your x-rays did not show any broken or dislocated bones.  I  do believe you have bruised your back as a result of your recent fall from your [place] lift.  You may supplement your tramadol  and Lyrica  with over-the-counter Tylenol .  Take it according the package instructions.  You may also apply over-the-counter Salonpas or lidocaine  patches to your back directly to help with pain.  Each patch is good for 8 hours.  Additionally, you may apply ice, or moist heat, to your back for 20-minute at a time, 2-3 times a day, to help with pain and inflammation.  If your pain does not improve I would recommend that you follow-up with your primary care provider or with orthopedics.     ED Prescriptions   None    I have reviewed the PDMP during this encounter.   Kent Pear, NP 09/25/23 1537    Kent Pear, NP 09/25/23 1616

## 2023-09-27 ENCOUNTER — Ambulatory Visit (HOSPITAL_COMMUNITY): Payer: Self-pay

## 2023-10-12 ENCOUNTER — Telehealth: Payer: Self-pay | Admitting: Neurology

## 2023-10-12 NOTE — Telephone Encounter (Signed)
 Patient said received a phone call from this office and do not know why. Informed patient did not see where any one have called from this office; may be they will call back.

## 2023-10-28 IMAGING — MR MR HEAD WO/W CM
16 series · 48 of 48 positions shown · IV contrast (gadavist)
Comparison: Brain MRI 03/10/2020

CLINICAL DATA: Multiple sclerosis follow-up

EXAM:
MRI HEAD WITHOUT AND WITH CONTRAST
TECHNIQUE: Multiplanar, multiecho pulse sequences of the brain and surrounding
structures were obtained without and with intravenous contrast.
CONTRAST:  6mL GADAVIST GADOBUTROL 1 MMOL/ML IV SOLN

[Series 5: ax dwi_tracew · axial · 3.0mm · 0.60mm/px · z∈[-118,+37]mm · 3 of 48 slices shown]
[im 1/48]
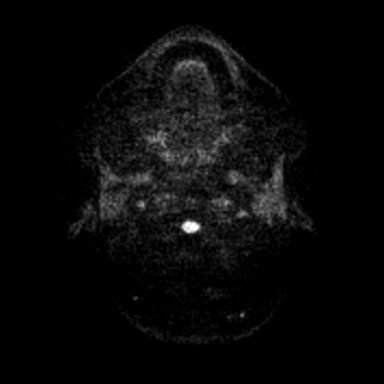
[im 24/48]
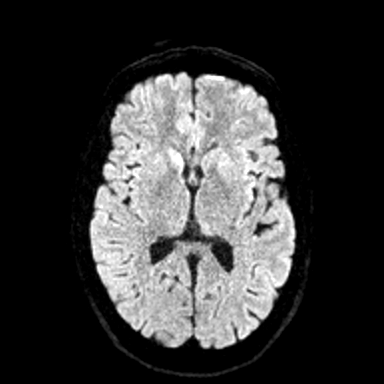
[im 48/48]
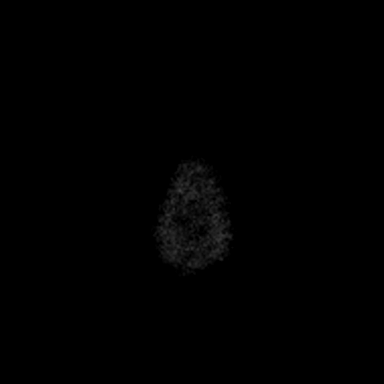

[Series 6: ax dwi_adc · axial · 3.0mm · 0.60mm/px · z∈[-118,+37]mm · 3 of 48 slices shown]
[im 1/48]
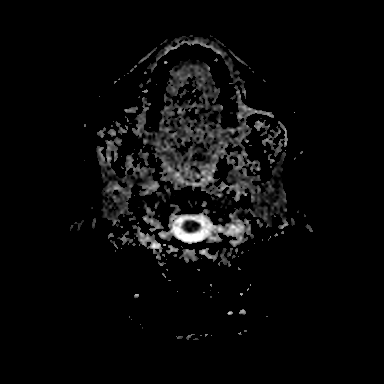
[im 24/48]
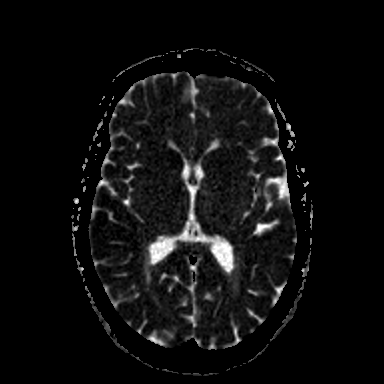
[im 48/48]
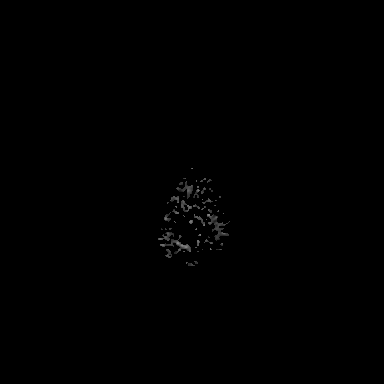

[Series 7: T1 · sagittal · 5.0mm · 0.62mm/px · 1 of 21 slices shown (1 of 2)]
[im 1/21]
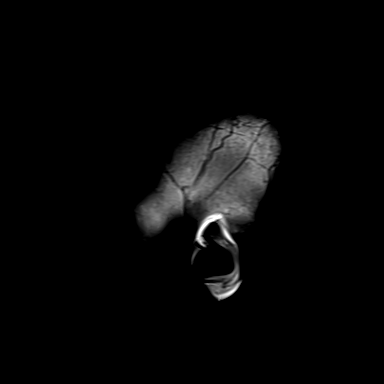

[Series 8: cor dwi_tracew · coronal · 5.0mm · 1.80mm/px · 2 of 38 slices shown]
[im 1/38]
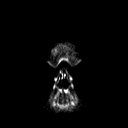
[im 38/38]
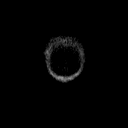

[Series 9: cor dwi_adc · coronal · 5.0mm · 1.80mm/px · 2 of 38 slices shown]
[im 1/38]
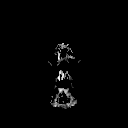
[im 38/38]
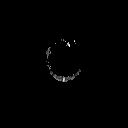

[Series 10: FLAIR · sagittal · 5.0mm · 0.94mm/px · 1 of 21 slices shown (1 of 2)]
[im 1/21]
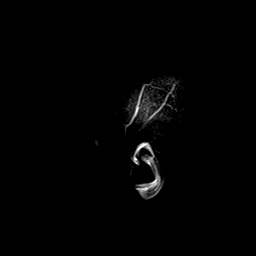

[Series 11: T2 · axial · 5.0mm · 0.53mm/px · 1 of 25 slices shown (1 of 2)]
[im 1/25]
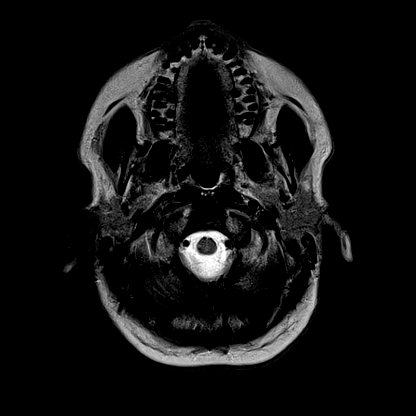

[Series 12: mag_images · axial · 3.0mm · 0.90mm/px · z∈[-128,+48]mm · 3 of 60 slices shown]
[im 1/60]
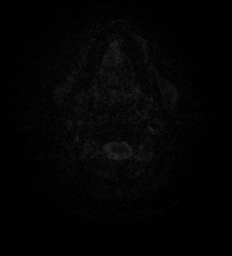
[im 30/60]
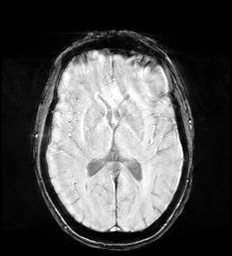
[im 60/60]
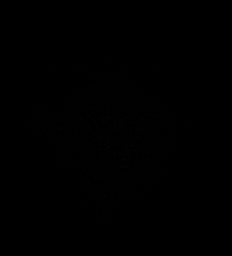

[Series 13: pha_images · axial · 3.0mm · 0.90mm/px · z∈[-128,+42]mm · 3 of 56 slices shown]
[im 1/56]
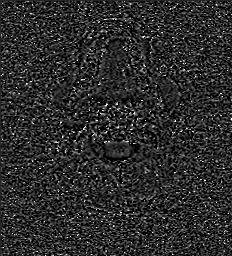
[im 28/56]
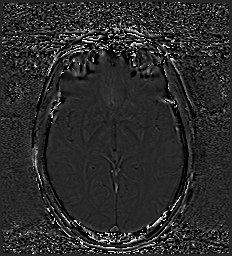
[im 56/56]
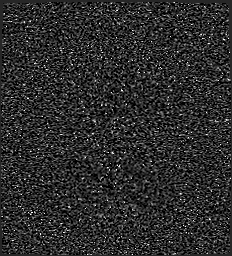

[Series 14: swi_images · axial · 3.0mm · 0.90mm/px · z∈[-128,+48]mm · 3 of 60 slices shown]
[im 1/60]
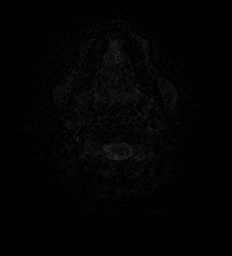
[im 30/60]
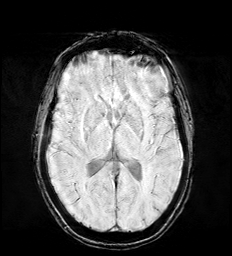
[im 60/60]
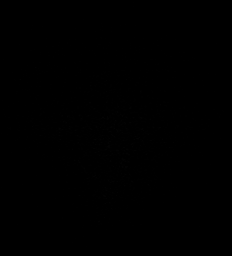

[Series 16: FLAIR · axial · 3.0mm · 0.53mm/px · z∈[-121,+41]mm · 3 of 55 slices shown (2 of 2)]
[im 1/55]
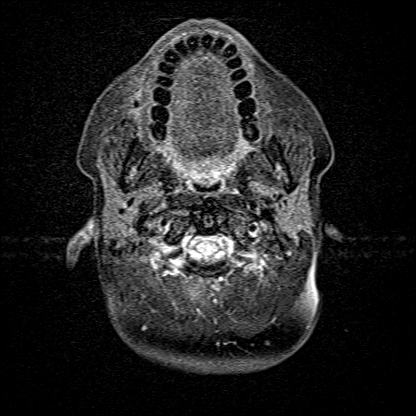
[im 28/55]
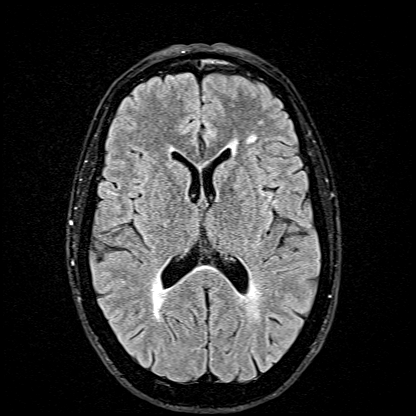
[im 55/55]
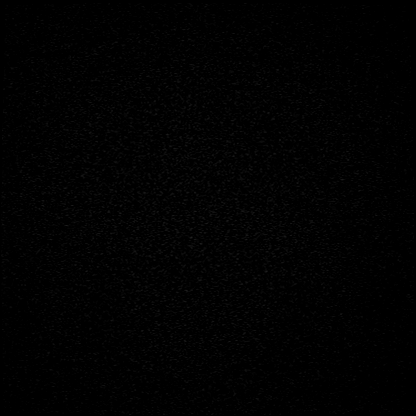

[Series 17: T1 · axial · 1.0mm · 0.98mm/px · z∈[-128,+46]mm · 9 of 176 slices shown (2 of 2)]
[im 1/176]
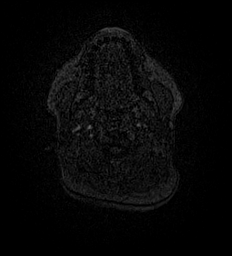
[im 22/176]
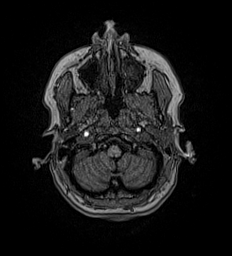
[im 44/176]
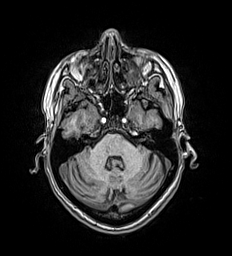
[im 66/176]
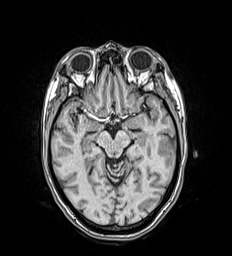
[im 88/176]
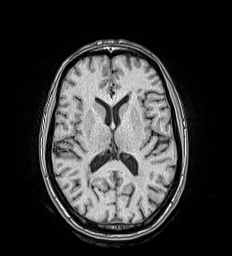
[im 110/176]
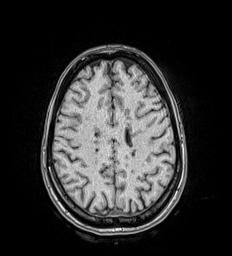
[im 132/176]
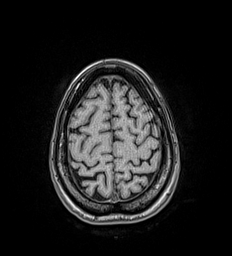
[im 154/176]
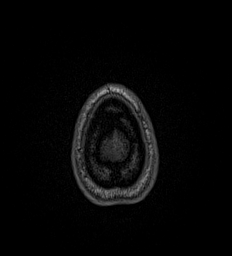
[im 176/176]
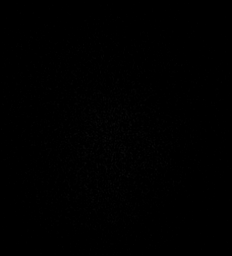

[Series 18: T2 · coronal · 5.0mm · 0.86mm/px · 2 of 29 slices shown (2 of 2)]
[im 1/29]
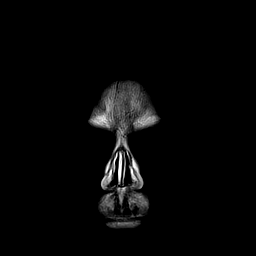
[im 29/29]
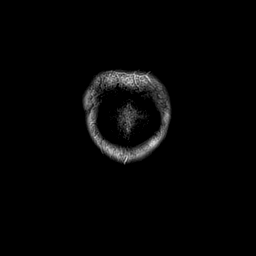

[Series 19: T1 post-contrast · axial · 1.0mm · 0.98mm/px · z∈[-128,+46]mm · 9 of 176 slices shown (1 of 3)]
[im 1/176]
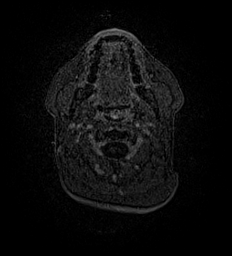
[im 22/176]
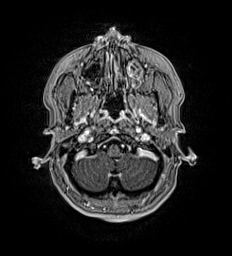
[im 44/176]
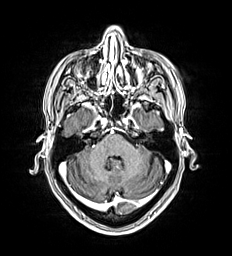
[im 66/176]
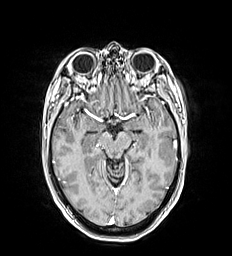
[im 88/176]
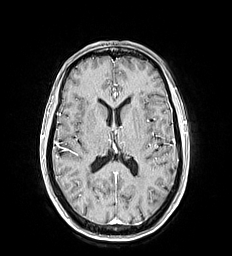
[im 110/176]
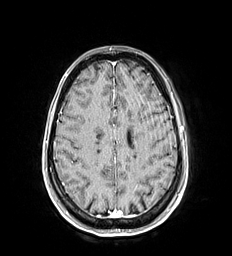
[im 132/176]
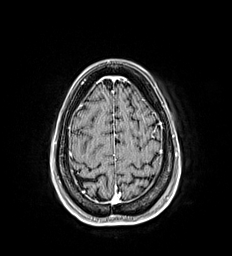
[im 154/176]
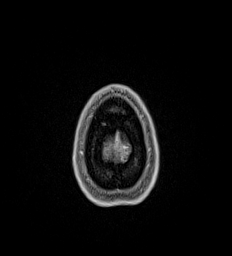
[im 176/176]
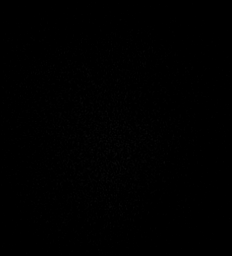

[Series 20: T1 post-contrast · coronal · 5.0mm · 0.57mm/px · 2 of 29 slices shown (2 of 3)]
[im 1/29]
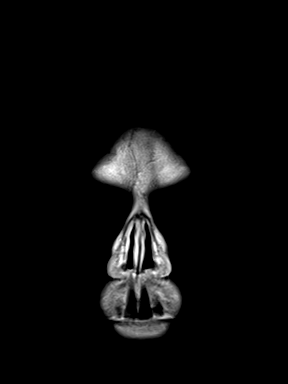
[im 29/29]
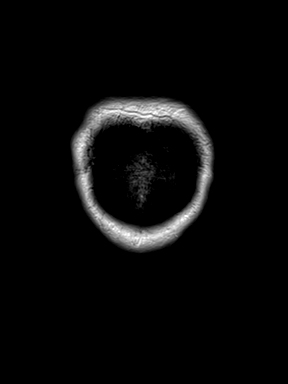

[Series 21: T1 post-contrast · sagittal · 5.0mm · 0.62mm/px · 1 of 21 slices shown (3 of 3)]
[im 1/21]
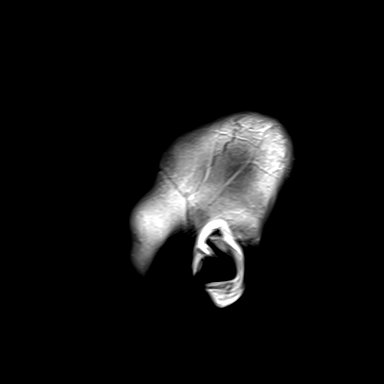

[48 of 48 positions shown; findings below may reference images not displayed]

FINDINGS: Brain: Again seen are numerous foci of FLAIR signal abnormality in
the juxtacortical and periventricular white matter in keeping with
the history of multiple sclerosis. The appearance and distribution
of the lesions is not significantly changed since 9592. No definite
new lesions are identified. There are no enhancing lesions to
suggest active demyelination.

There is no evidence of acute intracranial hemorrhage or extra-axial
fluid collection. There is no evidence of acute infarct

Parenchymal volume is normal.  The ventricles are normal in size.

There is no abnormal enhancement. There is no mass lesion. There is
no mass effect or midline shift.

Vascular: Normal flow voids.

Skull and upper cervical spine: Normal marrow signal.

Sinuses/Orbits: There is near-complete opacification of the left
maxillary sinus. The globes and orbits are unremarkable. The optic
nerves are unremarkable on these nondedicated sequences.

Other: None.
IMPRESSION: 1. Multiple white matter lesions in the supratentorial brain
consistent with the history of multiple sclerosis, unchanged since
03/10/2020. No new or enhancing lesions identified.
2. Near-complete opacification of the left maxillary sinus.

## 2023-11-03 ENCOUNTER — Encounter: Payer: Self-pay | Admitting: Neurology

## 2023-11-03 ENCOUNTER — Ambulatory Visit: Admitting: Neurology

## 2023-11-03 ENCOUNTER — Telehealth: Payer: Self-pay | Admitting: Neurology

## 2023-11-03 NOTE — Telephone Encounter (Signed)
 Called pt and confirmed 7/30 @ 1:20 pm works for her.

## 2023-11-03 NOTE — Telephone Encounter (Signed)
 Phone room: Please tell pt that she is scheduled next week at 1:20 and arrival time is 1pm. See if that works for the pt?

## 2023-11-03 NOTE — Telephone Encounter (Signed)
  Pt has to cancel appt today at 1:30 with Dr . Onita  due to wheelchair being broken . Pt would like to be seen in the next two weeks because in August Pt will be out of town     appt canceled

## 2023-11-10 ENCOUNTER — Ambulatory Visit (INDEPENDENT_AMBULATORY_CARE_PROVIDER_SITE_OTHER): Admitting: Neurology

## 2023-11-10 ENCOUNTER — Encounter: Payer: Self-pay | Admitting: Neurology

## 2023-11-10 VITALS — BP 105/72 | HR 77

## 2023-11-10 DIAGNOSIS — G35 Multiple sclerosis: Secondary | ICD-10-CM

## 2023-11-10 DIAGNOSIS — G8220 Paraplegia, unspecified: Secondary | ICD-10-CM | POA: Diagnosis not present

## 2023-11-10 MED ORDER — INCOBOTULINUMTOXINA 100 UNITS IM SOLR
600.0000 [IU] | INTRAMUSCULAR | Status: AC
Start: 2023-11-10 — End: ?
  Administered 2023-11-10: 600 [IU] via INTRAMUSCULAR

## 2023-11-10 NOTE — Progress Notes (Signed)
 Chief Complaint  Patient presents with   Injections    Room 14 Pt is with spouse     ASSESSMENT ANDp [name] is a 63 y.o. female   Secondary progressive multiple sclerosis Spastic paraplegia  Significant bilateral knee flex contraction, limited range of motion even with passive stretch, painful, tightness of bilateral hamstring tendon,  Electrical stimulation guided xeomin  injection, used 600 units, (100 units of xeomin  was dissolved into to 2 cc of normal saline)  Right semimembranosus 25 units Right semitendinosus 25 units Right biceps femoris long head 25 units Right biceps femoris short head 25 units Right iliopsoas 150 units Right adductor longus 150 unit Right adductor magnus 100 units  Left semimembranosus 25 units Left semitendinosis 25 units Left biceps femoris long head 25 units Right biceps femoris short head 25 units    DIAGNOSTIC DATA (LABS, IMAGING, TESTING) - I reviewed patient records, labs, notes, testing and imaging myself where available.   MEDICAL HISTORY:  [name], is a 63 year old female, accompanied by her boyfriend seen in request by  neurologist Potter, Zachary E, at [facility] for evaluation of botulism toxin injection for spastic bilateral lower extremity, her primary care physician is Dr. Sherial, [place], initial evaluation was on March 30, 2023  History is obtained from the patient and review of electronic medical records. I personally reviewed pertinent available imaging films in PACS.   PMHx of  Restless leg syndrome Urinary incontinence. GERD DVT-PE during COVID in 2023,  Depression, Chronic insominia Neuropathic pain,  She was diagnosed with relapsing remitting multiple sclerosis around 1998, presenting with gait abnormality also complains of hand clumsiness, paresthesia, she was diagnosed and treated at [place]  until she moved to [place]  in 2021, she was treated with  injectable Avonex, from 1998-2011, then switched to Gilenya from 2011-2021, now receiving Ocrevus at home infusions since 2021   At baseline prior to her COVID infection in November 2023, she was able to ambulate with walker, doing basic house chores, she suffered significant setback since her COVID infection in Nov 2023, she had bilateral lower extremity DVT, pulmonary emboli, had pulmonary thrombectomy, massive amount of clot removed,acute hypoxic respiratory failure, bilateral pneumonia, pulmonary hemorrhage, then hospital admission in January 2024 for sepsis secondary to UTI, sacral cellulitis,  Since above severe medical issues, she spent lengthy time in her bed, despite rehabilitation, she has developed severe contraction of bilateral lower extremity, now wheelchair-bound, difficult to straighten out her lower extremity, could not bear weight, needing help in transportation,  She is hoping to receive botulism toxin injection to relax bilateral lower extremity contraction, help weightbearing and transfer  Today's examination showed painful lower extremity fixed knee flexion, limited range of motion, tight bilateral hamstring muscles  UPDATE May 05 2023: This is her first electrical stimulation guided xeomin  injection for spastic bilateral lower extremity, used xeomin  600 units, potential side effect explained, consent form was signed,  UPDATE August 03 2023: She has mild improvement of her lower extremity spasticity after injection, was seen by orthopedic surgeon, deemed not a candidate for any intervention,  UPDATE November 10 2023: She reports mild improvement with injection, is receiving physical therapy, has better range of motion, is most bothered by right hip flexion, adduction,  PHYSICAL EXAM:      11/10/2023    1:15 PM 09/25/2023    2:41 PM 09/25/2023    2:39 PM  Vitals with BMI  Height   5' 2  Weight   128 lbs 1 oz  BMI   23.42  Systolic 105 107   Diastolic 72 70   Pulse 77 88       PHYSICAL EXAMNIATION:  Gen: NAD, conversant, well nourised, well groomed                     Cardiovascular: Regular rate rhythm, no peripheral edema, warm, nontender. Eyes: Conjunctivae clear without exudates or hemorrhage Neck: Supple, no carotid bruits. Pulmonary: Clear to auscultation bilaterally   NEUROLOGICAL EXAM:  MENTAL STATUS: Speech/cognition: Awake, alert, oriented to history taking and casual conversation CRANIAL NERVES: CN II: Visual fields are full to confrontation. Pupils are round equal and briskly reactive to light. CN III, IV, VI: extraocular movement are normal. No ptosis. CN V: Facial sensation is intact to light touch CN VII: Face is symmetric with normal eye closure  CN VIII: Hearing is normal to causal conversation. CN IX, X: Phonation is normal. CN XI: Head turning and shoulder shrug are intact  MOTOR: Mild bilateral shoulder abduction, grip weakness, left worse than right, mild fixation of left upper extremity on rapid rotating movement.  Fixed contraction of bilateral lower extremity, bilateral hip adduction, worse on the right side, even with passive stretch, maximum right knee extension 120, left was 150 degree, limited range of motion of bilateral ankle tendency for right knee adductor towards the left side, with right hip flexion  REFLEXES: Hypoactive  SENSORY: Length-dependent sensory changes  COORDINATION: There is no trunk or limb dysmetria noted.  GAIT/STANCE: Wheelchair-bound  REVIEW OF SYSTEMS:  Full 14 system review of systems performed and notable only for as above All other review of systems were negative.   ALLERGIES: Allergies  Allergen Reactions   Erythromycin Hives and Nausea And Vomiting   Hydrocodone-Acetaminophen  Other (See Comments)    Paranoid and depressed    Lexapro [Escitalopram Oxalate] Hives    HOME MEDICATIONS: Current Outpatient Medications  Medication Sig Dispense Refill   ascorbic acid (VITAMIN C) 500  MG tablet Take 500 mg by mouth daily.     Cholecalciferol 50 MCG (2000 UT) CAPS Take 2,000 Units by mouth daily.     dalfampridine  10 MG TB12 Take 1 tablet by mouth every 12 (twelve) hours.     DULoxetine  (CYMBALTA ) 30 MG capsule Take 30 mg by mouth daily.     estradiol  (ESTRACE  VAGINAL) 0.1 MG/GM vaginal cream Apply 0.5mg  (pea-sized amount)  just inside the vaginal introitus with a finger-tip on Monday, Wednesday and Friday nights. 30 g 12   gabapentin  (NEURONTIN ) 800 MG tablet Take 400-800 mg by mouth 2 (two) times daily.     lidocaine  (XYLOCAINE ) 2 % solution Use as directed 15 mLs in the mouth or throat as needed for mouth pain. 100 mL 0   midodrine  (PROAMATINE ) 5 MG tablet Take 1 tablet (5 mg total) by mouth 2 (two) times daily with a meal. 14 tablet 0   omeprazole (PRILOSEC) 20 MG capsule Take 20 mg by mouth daily.     pregabalin  (LYRICA ) 75 MG capsule Take 75 mg by mouth 3 (three) times daily.     propranolol  (INDERAL ) 20 MG tablet Take 20 mg by mouth 2 (two) times daily.     rivaroxaban  (XARELTO ) 20 MG TABS tablet Take 1 tablet (20 mg total) by mouth daily with supper. 30 tablet 0   traMADol  (ULTRAM ) 50 MG tablet Take 50 mg by mouth 2 (two) times daily as needed.     traZODone  (DESYREL ) 50 MG tablet Take 50 mg by  mouth at bedtime.     trimethoprim  (TRIMPEX ) 100 MG tablet Take 100 mg by mouth daily.     Current Facility-Administered Medications  Medication Dose Route Frequency Provider Last Rate Last Admin   incobotulinumtoxinA  (XEOMIN ) 100 units injection 600 Units  600 Units Intramuscular Q90 days Onita Duos, MD        PAST MEDICAL HISTORY: Past Medical History:  Diagnosis Date   Bilateral pulmonary embolism (HCC)    Chronic constipation    Family history of pancreatic cancer    Frequent falls    GERD (gastroesophageal reflux disease)    Hiatal hernia    Multiple sclerosis (HCC)    Pulmonary hemorrhage    RLS (restless legs syndrome)    Sepsis due to pneumonia (HCC)     Tachycardia     PAST SURGICAL HISTORY: Past Surgical History:  Procedure Laterality Date   ESOPHAGOGASTRODUODENOSCOPY (EGD) WITH PROPOFOL  N/A 09/11/2021   Procedure: ESOPHAGOGASTRODUODENOSCOPY (EGD) WITH PROPOFOL ;  Surgeon: Onita Elspeth Sharper, DO;  Location: [facility] ENDOSCOPY;  Service: Gastroenterology;  Laterality: N/A;   PULMONARY THROMBECTOMY Bilateral 02/25/2022   Procedure: PULMONARY THROMBECTOMY;  Surgeon: Marea Selinda RAMAN, MD;  Location: [facility] INVASIVE CV LAB;  Service: Cardiovascular;  Laterality: Bilateral;   TUBAL LIGATION      FAMILY HISTORY: History reviewed. No pertinent family history.  SOCIAL HISTORY: Social History   Socioeconomic History   Marital status: Widowed    Spouse name: Not on file   Number of children: Not on file   Years of education: Not on file   Highest education level: Not on file  Occupational History   Not on file  Tobacco Use   Smoking status: Never   Smokeless tobacco: Never  Vaping Use   Vaping status: Never Used  Substance and Sexual Activity   Alcohol use: Never   Drug use: Never   Sexual activity: Not Currently  Other Topics Concern   Not on file  Social History Narrative   Not on file   Social Drivers of Health   Financial Resource Strain: Low Risk  (01/19/2023)   Received from [facility]   Overall Financial Resource Strain (CARDIA)    Difficulty of Paying Living Expenses: Not hard at all  Food Insecurity: No Food Insecurity (01/19/2023)   Received from [facility]   Hunger Vital Sign    Within the past 12 months, the food you bought just didn't last and you didn't have money to get more.: Never true    Within the past 12 months, you worried that your food would run out before you got the money to buy more.: Never true  Transportation Needs: No Transportation Needs (01/19/2023)   Received from [facility]   PRAPARE - Transportation    Lack of Transportation (Non-Medical): No     In the past 12 months, has lack of transportation kept you from medical appointments or from getting medications?: No  Physical Activity: Not on file  Stress: Not on file  Social Connections: Not on file  Intimate Partner Violence: Not At Risk (04/17/2022)   Humiliation, Afraid, Rape, and Kick questionnaire    Fear of Current or Ex-Partner: No    Emotionally Abused: No    Physically Abused: No    Sexually Abused: No      Duos Onita, M.D. Ph.D.  [facility] [address] [place], [place] [address] Ph: [phone] Fax: [phone]  CC:  Sherial Bail, MD [phone] [facility]  [address] [place],  [place] [address]  Sherial Bail, MD

## 2023-11-10 NOTE — Progress Notes (Signed)
 xeomin  100units x 6 vial  Ndc-0259-1610-01 Onu-[id number] Exp-2027/09 B/B  Bacteriostatic 0.9% Sodium Chloride - 12mL  Onu:[id number] Expiration: 02/10/25 NDC: 9590803397 Dx:  H17.79, G35   WITNESSED BY:A JOSHUA RN

## 2023-11-17 NOTE — Progress Notes (Signed)
 11/18/2023 8:46 AM   [name] [birth year] [id number]  Referring provider: Sherial Bail, MD [address] [place],  [place] [address]  Urological history: 1. Neurogenic bladder -contributing factors of age and MS -RUS (05/2023) NED   2. rUTI's  -Contributing factors of incomplete bladder emptying, MS, incontinence and nonambulatory status -Trimethoprim  100 mg daily  Chief Complaint  Patient presents with   Follow-up   HPI: [name] is a 63 y.o. female who presents today for yearly follow up with her SO, Toribio.   Previous records reviewed.   She continues to experience incontinence even though she is self cathing.   The incontinence seems to occur during positioning.  She just finished a antibiotic for urinary tract infection.   Patient denies any modifying or aggravating factors.  Patient denies any recent UTI's, gross hematuria, dysuria or suprapubic/flank pain.  Patient denies any fevers, chills, nausea or vomiting.    PVR 216 mL   PMH: Past Medical History:  Diagnosis Date   Bilateral pulmonary embolism (HCC)    Chronic constipation    Family history of pancreatic cancer    Frequent falls    GERD (gastroesophageal reflux disease)    Hiatal hernia    Multiple sclerosis (HCC)    Pulmonary hemorrhage    RLS (restless legs syndrome)    Sepsis due to pneumonia (HCC)    Tachycardia     Surgical History: Past Surgical History:  Procedure Laterality Date   ESOPHAGOGASTRODUODENOSCOPY (EGD) WITH PROPOFOL  N/A 09/11/2021   Procedure: ESOPHAGOGASTRODUODENOSCOPY (EGD) WITH PROPOFOL ;  Surgeon: Onita Elspeth Sharper, DO;  Location: [facility] ENDOSCOPY;  Service: Gastroenterology;  Laterality: N/A;   PULMONARY THROMBECTOMY Bilateral 02/25/2022   Procedure: PULMONARY THROMBECTOMY;  Surgeon: Marea Selinda RAMAN, MD;  Location: [facility] INVASIVE CV LAB;  Service: Cardiovascular;  Laterality: Bilateral;   TUBAL LIGATION      Home Medications:  Allergies  as of 11/18/2023       Reactions   Erythromycin Hives, Nausea And Vomiting   Hydrocodone-acetaminophen  Other (See Comments)   Paranoid and depressed    Lexapro [escitalopram Oxalate] Hives        Medication List        Accurate as of November 18, 2023 11:59 PM. If you have any questions, ask your nurse or doctor.          ascorbic acid 500 MG tablet Commonly known as: VITAMIN C Take 500 mg by mouth daily.   Cholecalciferol 50 MCG (2000 UT) Caps Take 2,000 Units by mouth daily.   dalfampridine  10 MG Tb12 Take 1 tablet by mouth every 12 (twelve) hours.   DULoxetine  30 MG capsule Commonly known as: CYMBALTA  Take 30 mg by mouth daily.   estradiol  0.1 MG/GM vaginal cream Commonly known as: ESTRACE  VAGINAL Apply 0.5mg  (pea-sized amount)  just inside the vaginal introitus with a finger-tip on Monday, Wednesday and Friday nights.   gabapentin  800 MG tablet Commonly known as: NEURONTIN  Take 400-800 mg by mouth 2 (two) times daily.   lidocaine  2 % solution Commonly known as: XYLOCAINE  Use as directed 15 mLs in the mouth or throat as needed for mouth pain.   midodrine  5 MG tablet Commonly known as: PROAMATINE  Take 1 tablet (5 mg total) by mouth 2 (two) times daily with a meal.   omeprazole 20 MG capsule Commonly known as: PRILOSEC Take 20 mg by mouth daily.   pregabalin  75 MG capsule Commonly known as: LYRICA  Take 75 mg by mouth 3 (three) times daily.  propranolol  20 MG tablet Commonly known as: INDERAL  Take 20 mg by mouth 2 (two) times daily.   rivaroxaban  20 MG Tabs tablet Commonly known as: XARELTO  Take 1 tablet (20 mg total) by mouth daily with supper.   traMADol  50 MG tablet Commonly known as: ULTRAM  Take 50 mg by mouth 2 (two) times daily as needed.   traZODone  50 MG tablet Commonly known as: DESYREL  Take 50 mg by mouth at bedtime.   trimethoprim  100 MG tablet Commonly known as: TRIMPEX  Take 100 mg by mouth daily.        Allergies:  Allergies   Allergen Reactions   Erythromycin Hives and Nausea And Vomiting   Hydrocodone-Acetaminophen  Other (See Comments)    Paranoid and depressed    Lexapro [Escitalopram Oxalate] Hives    Family History: No family history on file.  Social History:  reports that she has never smoked. She has never used smokeless tobacco. She reports that she does not drink alcohol and does not use drugs.  ROS: Pertinent ROS in HPI  Physical Exam: BP 113/66 (BP Location: Left Arm, Patient Position: Sitting, Cuff Size: Normal)   Pulse 62   Ht 5' 2 (1.575 m)   Wt 130 lb (59 kg)   SpO2 95%   BMI 23.78 kg/m   Constitutional:  Well nourished. Alert and oriented, No acute distress. HEENT: [place] AT, moist mucus membranes.  Trachea midline Cardiovascular: No clubbing, cyanosis, or edema. Respiratory: Normal respiratory effort, no increased work of breathing. Neurologic: Grossly intact, no focal deficits, moving all 4 extremities. Psychiatric: Normal mood and affect.    Laboratory Data: N/A  Pertinent Imaging:  11/18/23 13:30  Scan Result    Assessment & Plan:    1. Neurogenic bladder - patient with a history of large residuals that seem to be managed with self catheterization who continues to have bothersome issues of incontinence that seems to be positional in nature.   Explained to her and her SO, that I would like to have UDS performed to further assess her bladder function.   It is difficult to delineate whether or not her MS is contributing to her incontinence or is she experiencing SUI as well.   UDS may help us  in this matter.   -I explained to the patient that urodynamics is a study that assesses how the bladder and urethra are performing their job of storing and releasing urine.  Urodynamic tests can help explain symptoms such as: incontinence, frequent urination, urgency, hesitancy, dysuria, urinary retention and/or recurrent UTI's.  To perform the test, special catheter is placed into the  urethra, a rectal catheter is placed and electrodes are placed around the perineum.  The bladder is filled with water  and the patient will be asked to void, it they can.  There may be a video component involved at some center, where the bladder activity is monitored by a video   - she and her SO are in agreement and we will refer her for UDS  2. rUTI's -she has recently been on Cipro for an UTI  -continue trimethoprim  100 mg daily    3. Vaginal atrophy -continue vaginal estrogen cream 3 nights weekly   Return for refer for UDS, will scheduled a televisit to go over results once completed .  These notes generated with voice recognition software. I apologize for typographical errors.  CLOTILDA HELON RIGGERS  [facility] [address]  [address] [place], [place] [address] [phone]

## 2023-11-18 ENCOUNTER — Ambulatory Visit (INDEPENDENT_AMBULATORY_CARE_PROVIDER_SITE_OTHER): Admitting: Urology

## 2023-11-18 VITALS — BP 113/66 | HR 62 | Ht 62.0 in | Wt 130.0 lb

## 2023-11-18 DIAGNOSIS — N952 Postmenopausal atrophic vaginitis: Secondary | ICD-10-CM

## 2023-11-18 DIAGNOSIS — N319 Neuromuscular dysfunction of bladder, unspecified: Secondary | ICD-10-CM | POA: Diagnosis not present

## 2023-11-18 DIAGNOSIS — N390 Urinary tract infection, site not specified: Secondary | ICD-10-CM | POA: Diagnosis not present

## 2023-11-18 LAB — BLADDER SCAN AMB NON-IMAGING

## 2023-11-20 ENCOUNTER — Encounter: Payer: Self-pay | Admitting: Urology

## 2024-01-21 ENCOUNTER — Other Ambulatory Visit: Payer: Self-pay

## 2024-01-21 DIAGNOSIS — N390 Urinary tract infection, site not specified: Secondary | ICD-10-CM

## 2024-01-21 MED ORDER — TRIMETHOPRIM 100 MG PO TABS: 100.0000 mg | ORAL_TABLET | Freq: Every day | ORAL | 11 refills | Status: AC

## 2024-02-14 ENCOUNTER — Encounter: Payer: Self-pay | Admitting: Radiology

## 2024-02-15 NOTE — Telephone Encounter (Signed)
 Dr. Onita, you are seeing this patient for Xeomin  tomorrow. Her plan is Medicare and will not allow use of a specialty pharmacy. Please review and let me know if we will be scheduling a new appointment for her in the new year.

## 2024-02-16 ENCOUNTER — Ambulatory Visit: Admitting: Neurology

## 2024-02-16 VITALS — BP 108/67

## 2024-02-16 DIAGNOSIS — G35D Multiple sclerosis, unspecified: Secondary | ICD-10-CM | POA: Diagnosis not present

## 2024-02-16 DIAGNOSIS — G8220 Paraplegia, unspecified: Secondary | ICD-10-CM

## 2024-02-16 MED ORDER — ONABOTULINUMTOXINA 100 UNITS IJ SOLR: 600.0000 [IU] | Freq: Once | INTRAMUSCULAR | Status: DC

## 2024-02-16 MED ORDER — INCOBOTULINUMTOXINA 100 UNITS IM SOLR: 600.0000 [IU] | INTRAMUSCULAR | Status: AC

## 2024-02-16 MED ORDER — INCOBOTULINUMTOXINA 100 UNITS IM SOLR: 600.0000 [IU] | INTRAMUSCULAR | Status: DC

## 2024-02-16 NOTE — Progress Notes (Unsigned)
 xeomin  100units x 6 vial  [id number] [phone] Exp-2027-12 B/B  Bacteriostatic 0.9% Sodium Chloride - 12mL  Onu:[id number] Expiration: 02/10/25 NDC: [id number] Dx:  H17.79, G35    WITNESSED AB:[id number] cma

## 2024-02-16 NOTE — Telephone Encounter (Signed)
 Patient will call for progress report before Christmas, if she decided to continue botulism injection for spasticity, may consider rehab refer

## 2024-02-16 NOTE — Progress Notes (Unsigned)
 Chief Complaint  Patient presents with   Injections    Rm17, husband present, Pt is well and ready for injection   ASSESSMENT ANDp [name] is a 63 y.o. female   Secondary progressive multiple sclerosis Spastic paraplegia  Significant bilateral knee flex contraction, limited range of motion even with passive stretch, painful, tightness of bilateral hamstring tendon,  Electrical stimulation guided xeomin  injection, used 600 units, (100 units of xeomin  was dissolved into to 2 cc of normal saline)  Right semimembranosus 25 units Right semitendinosus 25 units Right biceps femoris long head 25 units Right biceps femoris short head 25 units Right iliopsoas 150 units Right adductor longus 150 unit Right adductor magnus 100 units  Left semimembranosus 25 units Left semitendinosis 25 units Left biceps femoris long head 25 units Right biceps femoris short head 25 units    DIAGNOSTIC DATA (LABS, IMAGING, TESTING) - I reviewed patient records, labs, notes, testing and imaging myself where available.   MEDICAL HISTORY:  [name], is a 63 year old female, accompanied by her boyfriend seen in request by  neurologist Potter, Zachary E, at [facility] for evaluation of botulism toxin injection for spastic bilateral lower extremity, her primary care physician is Dr. Sherial, [place], initial evaluation was on March 30, 2023  History is obtained from the patient and review of electronic medical records. I personally reviewed pertinent available imaging films in PACS.   PMHx of  Restless leg syndrome Urinary incontinence. GERD DVT-PE during COVID in 2023,  Depression, Chronic insominia Neuropathic pain,  She was diagnosed with relapsing remitting multiple sclerosis around 1998, presenting with gait abnormality also complains of hand clumsiness, paresthesia, she was diagnosed and treated at [place]  until she moved to [place]  in  2021, she was treated with injectable Avonex, from 1998-2011, then switched to Gilenya from 2011-2021, now receiving Ocrevus at home infusions since 2021   At baseline prior to her COVID infection in November 2023, she was able to ambulate with walker, doing basic house chores, she suffered significant setback since her COVID infection in Nov 2023, she had bilateral lower extremity DVT, pulmonary emboli, had pulmonary thrombectomy, massive amount of clot removed,acute hypoxic respiratory failure, bilateral pneumonia, pulmonary hemorrhage, then hospital admission in January 2024 for sepsis secondary to UTI, sacral cellulitis,  Since above severe medical issues, she spent lengthy time in her bed, despite rehabilitation, she has developed severe contraction of bilateral lower extremity, now wheelchair-bound, difficult to straighten out her lower extremity, could not bear weight, needing help in transportation,  She is hoping to receive botulism toxin injection to relax bilateral lower extremity contraction, help weightbearing and transfer  Today's examination showed painful lower extremity fixed knee flexion, limited range of motion, tight bilateral hamstring muscles  UPDATE May 05 2023: This is her first electrical stimulation guided xeomin  injection for spastic bilateral lower extremity, used xeomin  600 units, potential side effect explained, consent form was signed,  UPDATE August 03 2023: She has mild improvement of her lower extremity spasticity after injection, was seen by orthopedic surgeon, deemed not a candidate for any intervention,  UPDATE November 10 2023: She reports mild improvement with injection, is receiving physical therapy, has better range of motion, is most bothered by right hip flexion, adduction,  PHYSICAL EXAM:      02/16/2024    1:17 PM 11/18/2023    1:29 PM 11/10/2023    1:15 PM  Vitals with BMI  Height  5' 2   Weight  130 lbs  BMI  23.77   Systolic 108 113 894   Diastolic 67 66 72  Pulse  62 77     PHYSICAL EXAMNIATION:  Gen: NAD, conversant, well nourised, well groomed                     Cardiovascular: Regular rate rhythm, no peripheral edema, warm, nontender. Eyes: Conjunctivae clear without exudates or hemorrhage Neck: Supple, no carotid bruits. Pulmonary: Clear to auscultation bilaterally   NEUROLOGICAL EXAM:  MENTAL STATUS: Speech/cognition: Awake, alert, oriented to history taking and casual conversation CRANIAL NERVES: CN II: Visual fields are full to confrontation. Pupils are round equal and briskly reactive to light. CN III, IV, VI: extraocular movement are normal. No ptosis. CN V: Facial sensation is intact to light touch CN VII: Face is symmetric with normal eye closure  CN VIII: Hearing is normal to causal conversation. CN IX, X: Phonation is normal. CN XI: Head turning and shoulder shrug are intact  MOTOR: Mild bilateral shoulder abduction, grip weakness, left worse than right, mild fixation of left upper extremity on rapid rotating movement.  Fixed contraction of bilateral lower extremity, bilateral hip adduction, worse on the right side, even with passive stretch, maximum right knee extension 120, left was 150 degree, limited range of motion of bilateral ankle tendency for right knee adductor towards the left side, with right hip flexion  REFLEXES: Hypoactive  SENSORY: Length-dependent sensory changes  COORDINATION: There is no trunk or limb dysmetria noted.  GAIT/STANCE: Wheelchair-bound  REVIEW OF SYSTEMS:  Full 14 system review of systems performed and notable only for as above All other review of systems were negative.   ALLERGIES: Allergies  Allergen Reactions   Erythromycin Hives and Nausea And Vomiting   Hydrocodone-Acetaminophen  Other (See Comments)    Paranoid and depressed    Lexapro [Escitalopram Oxalate] Hives    HOME MEDICATIONS: Current Outpatient Medications  Medication Sig Dispense  Refill   ascorbic acid (VITAMIN C) 500 MG tablet Take 500 mg by mouth daily.     Cholecalciferol 50 MCG (2000 UT) CAPS Take 2,000 Units by mouth daily.     dalfampridine  10 MG TB12 Take 1 tablet by mouth every 12 (twelve) hours.     DULoxetine  (CYMBALTA ) 30 MG capsule Take 30 mg by mouth daily.     estradiol  (ESTRACE  VAGINAL) 0.1 MG/GM vaginal cream Apply 0.5mg  (pea-sized amount)  just inside the vaginal introitus with a finger-tip on Monday, Wednesday and Friday nights. 30 g 12   gabapentin  (NEURONTIN ) 800 MG tablet Take 400-800 mg by mouth 2 (two) times daily.     lidocaine  (XYLOCAINE ) 2 % solution Use as directed 15 mLs in the mouth or throat as needed for mouth pain. 100 mL 0   midodrine  (PROAMATINE ) 5 MG tablet Take 1 tablet (5 mg total) by mouth 2 (two) times daily with a meal. 14 tablet 0   omeprazole (PRILOSEC) 20 MG capsule Take 20 mg by mouth daily.     pregabalin  (LYRICA ) 75 MG capsule Take 75 mg by mouth 3 (three) times daily.     propranolol  (INDERAL ) 20 MG tablet Take 20 mg by mouth 2 (two) times daily.     rivaroxaban  (XARELTO ) 20 MG TABS tablet Take 1 tablet (20 mg total) by mouth daily with supper. 30 tablet 0   traMADol  (ULTRAM ) 50 MG tablet Take 50 mg by mouth 2 (two) times daily as needed.     traZODone  (DESYREL ) 50 MG tablet Take 50 mg by  mouth at bedtime.     trimethoprim  (TRIMPEX ) 100 MG tablet Take 1 tablet (100 mg total) by mouth daily. 30 tablet 11   Current Facility-Administered Medications  Medication Dose Route Frequency Provider Last Rate Last Admin   incobotulinumtoxinA  (XEOMIN ) 100 units injection 600 Units  600 Units Intramuscular Q90 days Onita Duos, MD   600 Units at 11/10/23 1348   incobotulinumtoxinA  (XEOMIN ) 100 units injection 600 Units  600 Units Intramuscular Q90 days Onita Duos, MD        PAST MEDICAL HISTORY: Past Medical History:  Diagnosis Date   Bilateral pulmonary embolism (HCC)    Chronic constipation    Family history of pancreatic cancer     Frequent falls    GERD (gastroesophageal reflux disease)    Hiatal hernia    Multiple sclerosis    Pulmonary hemorrhage    RLS (restless legs syndrome)    Sepsis due to pneumonia (HCC)    Tachycardia     PAST SURGICAL HISTORY: Past Surgical History:  Procedure Laterality Date   ESOPHAGOGASTRODUODENOSCOPY (EGD) WITH PROPOFOL  N/A 09/11/2021   Procedure: ESOPHAGOGASTRODUODENOSCOPY (EGD) WITH PROPOFOL ;  Surgeon: Onita Elspeth Sharper, DO;  Location: [facility] ENDOSCOPY;  Service: Gastroenterology;  Laterality: N/A;   PULMONARY THROMBECTOMY Bilateral 02/25/2022   Procedure: PULMONARY THROMBECTOMY;  Surgeon: Marea Selinda RAMAN, MD;  Location: [facility] INVASIVE CV LAB;  Service: Cardiovascular;  Laterality: Bilateral;   TUBAL LIGATION      FAMILY HISTORY: No family history on file.  SOCIAL HISTORY: Social History   Socioeconomic History   Marital status: Widowed    Spouse name: Not on file   Number of children: Not on file   Years of education: Not on file   Highest education level: Not on file  Occupational History   Not on file  Tobacco Use   Smoking status: Never   Smokeless tobacco: Never  Vaping Use   Vaping status: Never Used  Substance and Sexual Activity   Alcohol use: Never   Drug use: Never   Sexual activity: Not Currently  Other Topics Concern   Not on file  Social History Narrative   Not on file   Social Drivers of Health   Financial Resource Strain: Low Risk  (11/19/2023)   Received from [facility]   Overall Financial Resource Strain (CARDIA)    Difficulty of Paying Living Expenses: Not hard at all  Food Insecurity: No Food Insecurity (11/19/2023)   Received from [facility]   Hunger Vital Sign    Within the past 12 months, you worried that your food would run out before you got the money to buy more.: Never true    Within the past 12 months, the food you bought just didn't last and you didn't have money to get more.: Never true   Transportation Needs: No Transportation Needs (11/19/2023)   Received from [facility] - Transportation    In the past 12 months, has lack of transportation kept you from medical appointments or from getting medications?: No    Lack of Transportation (Non-Medical): No  Physical Activity: Not on file  Stress: Not on file  Social Connections: Not on file  Intimate Partner Violence: Not At Risk (04/17/2022)   Humiliation, Afraid, Rape, and Kick questionnaire    Fear of Current or Ex-Partner: No    Emotionally Abused: No    Physically Abused: No    Sexually Abused: No      Duos Onita,  M.D. Ph.D.  [facility] [address] [place], [place] [address] Ph: [phone] Fax: [phone]  CC:  Sherial Bail, MD [address] [place],  [place] [address]  Sherial Bail, MD

## 2024-03-06 ENCOUNTER — Encounter: Payer: Self-pay | Admitting: Oncology

## 2024-03-06 ENCOUNTER — Inpatient Hospital Stay: Payer: Medicare Other | Attending: Oncology

## 2024-03-06 ENCOUNTER — Inpatient Hospital Stay: Payer: Medicare Other | Admitting: Oncology

## 2024-03-06 VITALS — HR 68 | Temp 97.9°F | Resp 18

## 2024-03-06 DIAGNOSIS — I2699 Other pulmonary embolism without acute cor pulmonale: Secondary | ICD-10-CM

## 2024-03-06 DIAGNOSIS — D72821 Monocytosis (symptomatic): Secondary | ICD-10-CM | POA: Insufficient documentation

## 2024-03-06 DIAGNOSIS — Z888 Allergy status to other drugs, medicaments and biological substances status: Secondary | ICD-10-CM | POA: Diagnosis not present

## 2024-03-06 DIAGNOSIS — Z79899 Other long term (current) drug therapy: Secondary | ICD-10-CM | POA: Insufficient documentation

## 2024-03-06 DIAGNOSIS — Z7901 Long term (current) use of anticoagulants: Secondary | ICD-10-CM | POA: Insufficient documentation

## 2024-03-06 DIAGNOSIS — R5382 Chronic fatigue, unspecified: Secondary | ICD-10-CM | POA: Diagnosis not present

## 2024-03-06 DIAGNOSIS — G35D Multiple sclerosis, unspecified: Secondary | ICD-10-CM | POA: Insufficient documentation

## 2024-03-06 DIAGNOSIS — L8990 Pressure ulcer of unspecified site, unspecified stage: Secondary | ICD-10-CM | POA: Insufficient documentation

## 2024-03-06 DIAGNOSIS — Z8701 Personal history of pneumonia (recurrent): Secondary | ICD-10-CM | POA: Diagnosis not present

## 2024-03-06 DIAGNOSIS — Z800 Family history of malignant neoplasm of digestive organs: Secondary | ICD-10-CM | POA: Diagnosis not present

## 2024-03-06 DIAGNOSIS — Z881 Allergy status to other antibiotic agents status: Secondary | ICD-10-CM | POA: Diagnosis not present

## 2024-03-06 DIAGNOSIS — Z885 Allergy status to narcotic agent status: Secondary | ICD-10-CM | POA: Insufficient documentation

## 2024-03-06 DIAGNOSIS — Z86711 Personal history of pulmonary embolism: Secondary | ICD-10-CM | POA: Insufficient documentation

## 2024-03-06 DIAGNOSIS — M8610 Other acute osteomyelitis, unspecified site: Secondary | ICD-10-CM | POA: Diagnosis not present

## 2024-03-06 LAB — CBC WITH DIFFERENTIAL (CANCER CENTER ONLY)
Abs Immature Granulocytes: 0.26 K/uL — ABNORMAL HIGH (ref 0.00–0.07)
Basophils Absolute: 0 K/uL (ref 0.0–0.1)
Basophils Relative: 0 %
Eosinophils Absolute: 0 K/uL (ref 0.0–0.5)
Eosinophils Relative: 0 %
HCT: 41.5 % (ref 36.0–46.0)
Hemoglobin: 13.6 g/dL (ref 12.0–15.0)
Immature Granulocytes: 5 %
Lymphocytes Relative: 15 %
Lymphs Abs: 0.8 K/uL (ref 0.7–4.0)
MCH: 27.9 pg (ref 26.0–34.0)
MCHC: 32.8 g/dL (ref 30.0–36.0)
MCV: 85 fL (ref 80.0–100.0)
Monocytes Absolute: 1 K/uL (ref 0.1–1.0)
Monocytes Relative: 18 %
Neutro Abs: 3.4 K/uL (ref 1.7–7.7)
Neutrophils Relative %: 62 %
Platelet Count: 175 K/uL (ref 150–400)
RBC: 4.88 MIL/uL (ref 3.87–5.11)
RDW: 14.4 % (ref 11.5–15.5)
WBC Count: 5.5 K/uL (ref 4.0–10.5)
nRBC: 0 % (ref 0.0–0.2)

## 2024-03-06 NOTE — Progress Notes (Signed)
 Hematology/Oncology Progress note [phone] [phone]       CHIEF COMPLAINTS/PURPOSE OF CONSULTATION:  Leukocytosis  ASSESSMENT & PLAN:   Bilateral pulmonary embolism (HCC) Continue Xarelto , managed by PCP.  Consider decrease to 10mg  daily for prophylaxis.   Leukocytosis Previous work up showed JAK2 V617F mutation negative, with reflex to other mutations CALR, MPL, JAK 2 Ex 12-15 mutations negative. EBV negative.  CMV negative, BCR-ABL FISH negative.  Peripheral blood flow cytometry showed monocytosis 17% of the leukocytes, with immunophenotypic aberrancies-CD56 in a subset and down-regulation of CD13.  A nonspecific finding that can be seen in association with reactive/active processes as well as neoplastic processes.  No immature B cells detected in the specimen.  Bone marrow biopsy findings are not diagnostic for neoplastic process.  Normal cytogenetics.  NGS showed TET2 mutation Today's cbc showed no leukocytosis.  Previous leukocytosis is most likely secondary to chronic inflammation from sacral decubitus ulcer which now has resolved.     Cc Dr. Sherial No orders of the defined types were placed in this encounter.  She has been doing very well clinically from hematology oncology aspect.  She will be discharged from the clinic.  She may reestablish care in the future if clinically indicated. All questions were answered. The patient knows to call the clinic with any problems, questions or concerns.  Zelphia Cap, MD, PhD [facility] Hematology Oncology 03/06/2024    HISTORY OF PRESENTING ILLNESS:  [name] 63 y.o. female presents to for post hospitalization follow up for leukocytosis She is accompanied by her boyfriend.  +Stage IV  Decubitus ulcer,acute osteomyelitis patient follow-up with wound care. She is on Trimethoprim  100mg  daily for prophylaxis.  Chronic fatigue unchanged. She is Xarelto  20mg  daily for PE.      MEDICAL  HISTORY:  Past Medical History:  Diagnosis Date   Bilateral pulmonary embolism (HCC)    Chronic constipation    Family history of pancreatic cancer    Frequent falls    GERD (gastroesophageal reflux disease)    Hiatal hernia    Multiple sclerosis    Pulmonary hemorrhage    RLS (restless legs syndrome)    Sepsis due to pneumonia (HCC)    Tachycardia     SURGICAL HISTORY: Past Surgical History:  Procedure Laterality Date   ESOPHAGOGASTRODUODENOSCOPY (EGD) WITH PROPOFOL  N/A 09/11/2021   Procedure: ESOPHAGOGASTRODUODENOSCOPY (EGD) WITH PROPOFOL ;  Surgeon: Onita Elspeth Sharper, DO;  Location: [facility] ENDOSCOPY;  Service: Gastroenterology;  Laterality: N/A;   PULMONARY THROMBECTOMY Bilateral 02/25/2022   Procedure: PULMONARY THROMBECTOMY;  Surgeon: Marea Selinda RAMAN, MD;  Location: [facility] INVASIVE CV LAB;  Service: Cardiovascular;  Laterality: Bilateral;   TUBAL LIGATION      SOCIAL HISTORY: Social History   Socioeconomic History   Marital status: Widowed    Spouse name: Not on file   Number of children: Not on file   Years of education: Not on file   Highest education level: Not on file  Occupational History   Not on file  Tobacco Use   Smoking status: Never   Smokeless tobacco: Never  Vaping Use   Vaping status: Never Used  Substance and Sexual Activity   Alcohol use: Never   Drug use: Never   Sexual activity: Not Currently  Other Topics Concern   Not on file  Social History Narrative   Not on file   Social Drivers of Health   Financial Resource Strain: Low Risk  (03/03/2024)   Received from [facility]  Overall Financial Resource Strain (CARDIA)    Difficulty of Paying Living Expenses: Not very hard  Food Insecurity: No Food Insecurity (03/03/2024)   Received from [facility]   Hunger Vital Sign    Within the past 12 months, you worried that your food would run out before you got the money to buy more.: Never true    Within the past  12 months, the food you bought just didn't last and you didn't have money to get more.: Never true  Transportation Needs: No Transportation Needs (03/03/2024)   Received from [facility] - Transportation    In the past 12 months, has lack of transportation kept you from medical appointments or from getting medications?: No    Lack of Transportation (Non-Medical): No  Physical Activity: Not on file  Stress: Not on file  Social Connections: Not on file  Intimate Partner Violence: Not At Risk (04/17/2022)   Humiliation, Afraid, Rape, and Kick questionnaire    Fear of Current or Ex-Partner: No    Emotionally Abused: No    Physically Abused: No    Sexually Abused: No    FAMILY HISTORY: History reviewed. No pertinent family history.  ALLERGIES:  is allergic to erythromycin, hydrocodone-acetaminophen , and lexapro [escitalopram oxalate].  MEDICATIONS:  Current Outpatient Medications  Medication Sig Dispense Refill   ascorbic acid (VITAMIN C) 500 MG tablet Take 500 mg by mouth daily.     Cholecalciferol 50 MCG (2000 UT) CAPS Take 2,000 Units by mouth daily.     dalfampridine  10 MG TB12 Take 1 tablet by mouth every 12 (twelve) hours.     DULoxetine  (CYMBALTA ) 30 MG capsule Take 30 mg by mouth daily.     estradiol  (ESTRACE  VAGINAL) 0.1 MG/GM vaginal cream Apply 0.5mg  (pea-sized amount)  just inside the vaginal introitus with a finger-tip on Monday, Wednesday and Friday nights. 30 g 12   gabapentin  (NEURONTIN ) 800 MG tablet Take 400-800 mg by mouth 2 (two) times daily.     lidocaine  (XYLOCAINE ) 2 % solution Use as directed 15 mLs in the mouth or throat as needed for mouth pain. 100 mL 0   midodrine  (PROAMATINE ) 5 MG tablet Take 1 tablet (5 mg total) by mouth 2 (two) times daily with a meal. 14 tablet 0   omeprazole (PRILOSEC) 20 MG capsule Take 20 mg by mouth daily.     pregabalin  (LYRICA ) 75 MG capsule Take 75 mg by mouth 3 (three) times daily.     propranolol   (INDERAL ) 20 MG tablet Take 20 mg by mouth 2 (two) times daily.     rivaroxaban  (XARELTO ) 20 MG TABS tablet Take 1 tablet (20 mg total) by mouth daily with supper. 30 tablet 0   traMADol  (ULTRAM ) 50 MG tablet Take 50 mg by mouth 2 (two) times daily as needed.     traZODone  (DESYREL ) 50 MG tablet Take 50 mg by mouth at bedtime.     trimethoprim  (TRIMPEX ) 100 MG tablet Take 1 tablet (100 mg total) by mouth daily. 30 tablet 11   Current Facility-Administered Medications  Medication Dose Route Frequency Provider Last Rate Last Admin   incobotulinumtoxinA  (XEOMIN ) 100 units injection 600 Units  600 Units Intramuscular Q90 days Onita Duos, MD   600 Units at 11/10/23 1348   incobotulinumtoxinA  (XEOMIN ) 100 units injection 600 Units  600 Units Intramuscular Q90 days Onita Duos, MD        Review of Systems  Constitutional:  Positive for fatigue. Negative  for appetite change, chills and fever.  HENT:   Negative for hearing loss and voice change.   Eyes:  Negative for eye problems.  Respiratory:  Negative for chest tightness, cough and shortness of breath.   Cardiovascular:  Negative for chest pain.  Gastrointestinal:  Negative for abdominal distention, abdominal pain and blood in stool.  Endocrine: Negative for hot flashes.  Genitourinary:  Negative for difficulty urinating and frequency.   Musculoskeletal:  Negative for arthralgias and back pain.  Skin:  Negative for itching and rash.       Sacrum ulcer has resolved.  Neurological:  Negative for extremity weakness.  Hematological:  Negative for adenopathy.  Psychiatric/Behavioral:  Negative for confusion.      PHYSICAL EXAMINATION: ECOG PERFORMANCE STATUS: 2 - Symptomatic, <50% confined to bed  Vitals:   03/06/24 1020  Pulse: 68  Resp: 18  Temp: 97.9 F (36.6 C)  SpO2: 98%   There were no vitals filed for this visit.  Physical Exam Constitutional:      General: She is not in acute distress.    Comments: Patient sits in wheelchair   HENT:     Head: Normocephalic and atraumatic.  Eyes:     General: No scleral icterus. Cardiovascular:     Rate and Rhythm: Normal rate.  Pulmonary:     Effort: Pulmonary effort is normal. No respiratory distress.  Abdominal:     General: There is no distension.     Palpations: Abdomen is soft.  Musculoskeletal:     Cervical back: Normal range of motion and neck supple.  Skin:    Coloration: Skin is not pale.  Neurological:     Mental Status: She is alert and oriented to person, place, and time. Mental status is at baseline.     Motor: No abnormal muscle tone.  Psychiatric:        Mood and Affect: Affect normal.      LABORATORY DATA:  I have reviewed the data as listed    Latest Ref Rng & Units 03/06/2024   10:02 AM 03/05/2023   11:00 AM 07/03/2022   11:45 AM  CBC  WBC 4.0 - 10.5 K/uL 5.5  7.4  12.3   Hemoglobin 12.0 - 15.0 g/dL 86.3  85.8  8.3   Hematocrit 36.0 - 46.0 % 41.5  45.1  28.6   Platelets 150 - 400 K/uL 175  197  426       Latest Ref Rng & Units 05/28/2022   12:00 PM 04/21/2022    6:18 AM 04/20/2022    5:07 AM  CMP  Glucose 70 - 99 mg/dL 894   99   BUN 6 - 20 mg/dL 11   8   Creatinine 9.55 - 1.00 mg/dL 9.53  9.56  9.53   Sodium 135 - 145 mmol/L 130   137   Potassium 3.5 - 5.1 mmol/L 3.5  4.4  3.4   Chloride 98 - 111 mmol/L 95   104   CO2 22 - 32 mmol/L 23   24   Calcium  8.9 - 10.3 mg/dL 8.5   8.2   Total Protein 6.5 - 8.1 g/dL 5.8     Total Bilirubin 0.3 - 1.2 mg/dL 0.5     Alkaline Phos 38 - 126 U/L 298     AST 15 - 41 U/L 32     ALT 0 - 44 U/L 17        RADIOGRAPHIC STUDIES: I have personally reviewed the radiological images  as listed and agreed with the findings in the report. No results found.

## 2024-03-06 NOTE — Assessment & Plan Note (Signed)
 Continue Xarelto, managed by PCP.  Consider decrease to 10mg  daily for prophylaxis.

## 2024-03-06 NOTE — Assessment & Plan Note (Signed)
 Previous work up showed JAK2 V617F mutation negative, with reflex to other mutations CALR, MPL, JAK 2 Ex 12-15 mutations negative. EBV negative.  CMV negative, BCR-ABL FISH negative.  Peripheral blood flow cytometry showed monocytosis 17% of the leukocytes, with immunophenotypic aberrancies-CD56 in a subset and down-regulation of CD13.  A nonspecific finding that can be seen in association with reactive/active processes as well as neoplastic processes.  No immature B cells detected in the specimen.  Bone marrow biopsy findings are not diagnostic for neoplastic process.  Normal cytogenetics.  NGS showed TET2 mutation Today's cbc showed no leukocytosis.  Previous leukocytosis is most likely secondary to chronic inflammation from sacral decubitus ulcer which now has resolved.
